# Patient Record
Sex: Male | Born: 1995
Health system: Southern US, Community
[De-identification: ages and names within clinical notes are randomized; demographics above are authoritative.]

## PROBLEM LIST (undated history)

## (undated) DIAGNOSIS — F1210 Cannabis abuse, uncomplicated: Secondary | ICD-10-CM

## (undated) DIAGNOSIS — F200 Paranoid schizophrenia: Secondary | ICD-10-CM

## (undated) DIAGNOSIS — F251 Schizoaffective disorder, depressive type: Principal | ICD-10-CM

---
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
## 2014-08-11 ENCOUNTER — Emergency Department (HOSPITAL_COMMUNITY)
Admission: EM | Admit: 2014-08-11 | Discharge: 2014-08-12 | Disposition: A | Discharge status: Other Acute Inpatient Hospital | Payer: Medicaid Other | Attending: Emergency Medicine | Admitting: Emergency Medicine

## 2014-08-11 ENCOUNTER — Encounter (HOSPITAL_COMMUNITY): Payer: Self-pay | Admitting: *Deleted

## 2014-08-11 DIAGNOSIS — F323 Major depressive disorder, single episode, severe with psychotic features: Secondary | ICD-10-CM | POA: Diagnosis present

## 2014-08-11 DIAGNOSIS — Y9289 Other specified places as the place of occurrence of the external cause: Secondary | ICD-10-CM | POA: Diagnosis not present

## 2014-08-11 DIAGNOSIS — Y998 Other external cause status: Secondary | ICD-10-CM | POA: Insufficient documentation

## 2014-08-11 DIAGNOSIS — Y9389 Activity, other specified: Secondary | ICD-10-CM | POA: Insufficient documentation

## 2014-08-11 DIAGNOSIS — T404X2A Poisoning by other synthetic narcotics, intentional self-harm, initial encounter: Secondary | ICD-10-CM | POA: Insufficient documentation

## 2014-08-11 DIAGNOSIS — R45851 Suicidal ideations: Secondary | ICD-10-CM

## 2014-08-11 DIAGNOSIS — T50902A Poisoning by unspecified drugs, medicaments and biological substances, intentional self-harm, initial encounter: Secondary | ICD-10-CM | POA: Diagnosis present

## 2014-08-11 LAB — COMPREHENSIVE METABOLIC PANEL
ALK PHOS: 45 U/L (ref 39–117)
ALT: 20 U/L (ref 0–53)
AST: 23 U/L (ref 0–37)
Albumin: 4.4 g/dL (ref 3.5–5.2)
Anion gap: 6 (ref 5–15)
BUN: 15 mg/dL (ref 6–23)
CHLORIDE: 103 mmol/L (ref 96–112)
CO2: 30 mmol/L (ref 19–32)
Calcium: 9.6 mg/dL (ref 8.4–10.5)
Creatinine, Ser: 1.27 mg/dL (ref 0.50–1.35)
GFR calc Af Amer: >90 mL/min (ref >90)
GFR calc non Af Amer: 80 mL/min — ABNORMAL LOW (ref >90)
Glucose, Bld: 84 mg/dL (ref 70–99)
Potassium: 4.6 mmol/L (ref 3.5–5.1)
Sodium: 139 mmol/L (ref 135–145)
TOTAL PROTEIN: 8.4 g/dL — AB (ref 6.0–8.3)
Total Bilirubin: 1.3 mg/dL — ABNORMAL HIGH (ref 0.3–1.2)

## 2014-08-11 LAB — CBC
HCT: 46.3 % (ref 39.0–52.0)
Hemoglobin: 15.6 g/dL (ref 13.0–17.0)
MCH: 30.4 pg (ref 26.0–34.0)
MCHC: 33.7 g/dL (ref 30.0–36.0)
MCV: 90.1 fL (ref 78.0–100.0)
Platelets: 217 10*3/uL (ref 150–400)
RBC: 5.14 MIL/uL (ref 4.22–5.81)
RDW: 12.9 % (ref 11.5–15.5)
WBC: 6 10*3/uL (ref 4.0–10.5)

## 2014-08-11 LAB — RAPID URINE DRUG SCREEN, HOSP PERFORMED
Amphetamines: NOT DETECTED
BENZODIAZEPINES: NOT DETECTED
Barbiturates: NOT DETECTED
Cocaine: NOT DETECTED
OPIATES: NOT DETECTED
TETRAHYDROCANNABINOL: NOT DETECTED

## 2014-08-11 LAB — SALICYLATE LEVEL: Salicylate Lvl: <4 mg/dL (ref 2.8–20.0)

## 2014-08-11 LAB — ETHANOL: Alcohol, Ethyl (B): <5 mg/dL (ref 0–9)

## 2014-08-11 LAB — ACETAMINOPHEN LEVEL: Acetaminophen (Tylenol), Serum: <10 ug/mL — ABNORMAL LOW (ref 10–30)

## 2014-08-11 MED ORDER — ONDANSETRON HCL 4 MG PO TABS: 4.0000 mg | ORAL_TABLET | Freq: Three times a day (TID) | ORAL | Status: DC | PRN

## 2014-08-11 MED ORDER — ALUM & MAG HYDROXIDE-SIMETH 200-200-20 MG/5ML PO SUSP: 30.0000 mL | ORAL | Status: DC | PRN

## 2014-08-11 MED ORDER — IBUPROFEN 200 MG PO TABS
600.0000 mg | ORAL_TABLET | Freq: Three times a day (TID) | ORAL | Status: DC | PRN
  Administered 2014-08-11: 600 mg via ORAL
  Filled 2014-08-11: qty 3

## 2014-08-11 NOTE — ED Notes (Signed)
Pt ambulatory w/o difficulty to room 37 
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
## 2014-08-11 NOTE — ED Notes (Signed)
Bed: [facility] Expected date:  Expected time:  Means of arrival:  Comments: 

## 2014-08-11 NOTE — ED Notes (Signed)
Bed: [facility] Expected date: 08/11/14 Expected time: 1:59 PM Means of arrival: Ambulance Comments: OD of ? Trazadone

## 2014-08-11 NOTE — ED Notes (Signed)
Attempted to give report to Psych ED, RN unavailable at this time.

## 2014-08-11 NOTE — ED Notes (Signed)
Patient is in bathroom changing into scrubs right now

## 2014-08-11 NOTE — ED Notes (Addendum)
Pt sts he took "4 or 5" tramadol pills from an old prescription bottle. Pt denies suicide attempt. Denies SI/HI. Family sts that pt. Has been withdrawn lately and not speaking very much, "pt mopes around." Pt does show interest in speaking with someone about his feelings. Pt denies any symptoms at this time. Pt A&Ox4.

## 2014-08-11 NOTE — ED Notes (Signed)
Report received from Lizbeth BarkJanie Rambo RN. Pt. Alert and oriented in no distress denies SI, AVH and pain. Pt. States he would "hurt Timothy [name] if I could".  Pt. Instructed to come to me with problems or concerns.Will continue to monitor for safety via security cameras and Q 15 minute checks.

## 2014-08-11 NOTE — ED Notes (Signed)
Report given, RN requested ten minutes before bringing patient back to 42.

## 2014-08-11 NOTE — ED Notes (Addendum)
Per ems pt is from home, family called ems pt they thought pt had taken some of grandmas tramadal, old prescription from 2011. Pt initially told medic he had not taken any medicine, then told [employer] he took meds, then told ems he took meds 1 month ago. Alert and oriented x4.   Upon rn assessment, pt repots his mom called ems because "he was acting weird". When asked pt says he is not acting weird. Pt is withdrawn, very limited in speech, answers yes and no, will not make eye contact. Pt reports around 1 month ago he took some meds, denies ETOH, drug use, or taking any medicines today. Reports last marijuana use was months ago. Denies medical hx. Denies pain. Denies SI/HI, AH/VH.

## 2014-08-11 NOTE — ED Provider Notes (Signed)
CSN: [id number]     Arrival date & time 08/11/14  1403 History   First MD Initiated Contact with Patient 08/11/14 1531     Chief Complaint  Patient presents with  . possible drug overdose   . unusual behavior      (Consider location/radiation/quality/duration/timing/severity/associated sxs/prior Treatment) HPI The patient reports that he took about 4 or 5 tramadol pills that were his grandmother's. In the triage note he apparently denies suicidal ideation. To me he reports that he has been getting increasingly depressed and had "things" building up for a number of months. He reports he took the pills to escape. I did notice some very well healed faint scars on his forearm that were consistent with cutting. The patient did report that he had had some cutting behaviors a number of months ago. He denies any other prior suicide attempt. He denies alcohol use he reports intermittent marijuana use. He denies any recent medical problems. He does not give any additional history on what types of things are causing him distress or depression. History reviewed. No pertinent past medical history. History reviewed. No pertinent past surgical history. History reviewed. No pertinent family history. History  Substance Use Topics  . Smoking status: Never Smoker   . Smokeless tobacco: Not on file  . Alcohol Use: No    Review of Systems  10 Systems reviewed and are negative for acute change except as noted in the HPI.   Allergies  Review of patient's allergies indicates no known allergies.  Home Medications   Prior to Admission medications   Not on File   BP 135/70 mmHg  Pulse 76  Temp(Src) 98.3 F (36.8 C) (Oral)  Resp 18  SpO2 100% Physical Exam  Constitutional: He is oriented to person, place, and time. He appears well-developed and well-nourished.  HENT:  Head: Normocephalic and atraumatic.  Eyes: EOM are normal. Pupils are equal, round, and reactive to light.  Mild diffuse  conjunctival injection. Normal extraocular motions.  Neck: Neck supple.  Cardiovascular: Normal rate, regular rhythm, normal heart sounds and intact distal pulses.   Pulmonary/Chest: Effort normal and breath sounds normal.  Abdominal: Soft. Bowel sounds are normal. He exhibits no distension. There is no tenderness.  Musculoskeletal: Normal range of motion. He exhibits no edema.  Neurological: He is alert and oriented to person, place, and time. He has normal strength. Coordination normal. GCS eye subscore is 4. GCS verbal subscore is 5. GCS motor subscore is 6.  Skin: Skin is warm, dry and intact.  Very faint well-healed linear scars on left forearm. Some erosions present on the back suggestive of picking.  Psychiatric: He has a normal mood and affect.    ED Course  Procedures (including critical care time) Labs Review Labs Reviewed  ACETAMINOPHEN LEVEL - Abnormal; Notable for the following:    Acetaminophen (Tylenol), Serum <10.0 (*)    All other components within normal limits  COMPREHENSIVE METABOLIC PANEL - Abnormal; Notable for the following:    Total Protein 8.4 (*)    Total Bilirubin 1.3 (*)    GFR calc non Af Amer 80 (*)    All other components within normal limits  CBC  ETHANOL  SALICYLATE LEVEL  URINE RAPID DRUG SCREEN (HOSP PERFORMED)    Imaging Review No results found.   EKG Interpretation   Date/Time:  Sunday August 11 2014 14:10:16 EST Ventricular Rate:  80 PR Interval:  177 QRS Duration: 82 QT Interval:  348 QTC Calculation: 401 R Axis:  84 Text Interpretation:  Sinus rhythm Baseline wander in lead(s) V2 Confirmed  by DOCHERTY  MD, MEGAN [phone]([id number]) on 08/11/2014 2:19:40 PM      MDM   Final diagnoses:  Suicidal ideation  Overdose, intentional self-harm, initial encounter   At this time the patient is well in appearance without confusion, vomiting or pain. It appears his overdose was of a nonlethal quantity of tramadol. At this time he is medically  cleared for further psychiatric evaluation.    Arby BarretteMarcy Britten Seyfried, MD 08/11/14 [phone]

## 2014-08-12 DIAGNOSIS — T1491 Suicide attempt: Secondary | ICD-10-CM

## 2014-08-12 DIAGNOSIS — T50902A Poisoning by unspecified drugs, medicaments and biological substances, intentional self-harm, initial encounter: Secondary | ICD-10-CM | POA: Diagnosis present

## 2014-08-12 DIAGNOSIS — T404X2A Poisoning by other synthetic narcotics, intentional self-harm, initial encounter: Secondary | ICD-10-CM

## 2014-08-12 DIAGNOSIS — T50901A Poisoning by unspecified drugs, medicaments and biological substances, accidental (unintentional), initial encounter: Secondary | ICD-10-CM | POA: Insufficient documentation

## 2014-08-12 DIAGNOSIS — F323 Major depressive disorder, single episode, severe with psychotic features: Secondary | ICD-10-CM

## 2014-08-12 DIAGNOSIS — R45851 Suicidal ideations: Secondary | ICD-10-CM

## 2014-08-12 MED ORDER — LORAZEPAM 1 MG PO TABS: 1.0000 mg | ORAL_TABLET | Freq: Once | ORAL | Status: DC

## 2014-08-12 NOTE — ED Notes (Signed)
Patient has become less verbal throughout the day.  When lunch came he stared at his tray for a long time and finally ate a small amount.  He then stood in one place in his room for a long while, then took off all his clothes and walked out into the hallway.  He was able to be directed back to his room with two staff members assisting him.  He has been nonverbal since sometime before lunch.

## 2014-08-12 NOTE — BH Assessment (Signed)
[facility] Assessment Progress Note  Per Para MarchJeanette at 14:10, [facility] Rm 8[facility] by Dr Dewaine CongerBarker.  Please call report to [phone].  Nanine MeansJamison Lord, NP has been notified, and she concurs with this decision.  Pt's nurse, Dawnaly, has been notified.  Doylene Canninghomas Jared Whorley, MA Triage Specialist 08/12/2014 @ 14:16
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
## 2014-08-12 NOTE — ED Notes (Signed)
Report called to Lance BoschShea, RN at [facility].  [facility], left message.  Lance BoschShea requests that we call the nursing station when patient is en route, [phone].

## 2014-08-12 NOTE — ED Notes (Signed)
[facility] at bedside to transport pt [facility].
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
## 2014-08-12 NOTE — ED Notes (Signed)
[facility].  Spoke with Lance BoschShea, [place].

## 2014-08-12 NOTE — B[facility] Counselor (Signed)
TTS Counselor faxed referrals to the following facilities in effort to obtain inpt placement:  [facility] [address]

## 2014-09-16 ENCOUNTER — Emergency Department (EMERGENCY_DEPARTMENT_HOSPITAL)
Admission: EM | Admit: 2014-09-16 | Discharge: 2014-09-18 | Disposition: A | Discharge status: Home / Self Care | Payer: Medicaid Other | Source: Home / Self Care | Attending: Emergency Medicine | Admitting: Emergency Medicine

## 2014-09-16 ENCOUNTER — Encounter (HOSPITAL_COMMUNITY): Payer: Self-pay | Admitting: Emergency Medicine

## 2014-09-16 DIAGNOSIS — F251 Schizoaffective disorder, depressive type: Secondary | ICD-10-CM | POA: Insufficient documentation

## 2014-09-16 DIAGNOSIS — Z79899 Other long term (current) drug therapy: Secondary | ICD-10-CM

## 2014-09-16 DIAGNOSIS — Z880 Allergy status to penicillin: Secondary | ICD-10-CM

## 2014-09-16 DIAGNOSIS — F1220 Cannabis dependence, uncomplicated: Secondary | ICD-10-CM | POA: Diagnosis present

## 2014-09-16 DIAGNOSIS — R5383 Other fatigue: Secondary | ICD-10-CM

## 2014-09-16 HISTORY — DX: Paranoid schizophrenia: F20.0

## 2014-09-16 HISTORY — DX: Schizoaffective disorder, depressive type: F25.1

## 2014-09-16 HISTORY — DX: Cannabis abuse, uncomplicated: F12.10

## 2014-09-16 LAB — COMPREHENSIVE METABOLIC PANEL
ALBUMIN: 4.2 g/dL (ref 3.5–5.2)
ALT: 20 U/L (ref 0–53)
ANION GAP: 7 (ref 5–15)
AST: 26 U/L (ref 0–37)
Alkaline Phosphatase: 41 U/L (ref 39–117)
BUN: 12 mg/dL (ref 6–23)
CHLORIDE: 104 mmol/L (ref 96–112)
CO2: 30 mmol/L (ref 19–32)
CREATININE: 1.12 mg/dL (ref 0.50–1.35)
Calcium: 9.2 mg/dL (ref 8.4–10.5)
GFR calc Af Amer: >90 mL/min (ref >90)
Glucose, Bld: 81 mg/dL (ref 70–99)
Potassium: 3.9 mmol/L (ref 3.5–5.1)
Sodium: 141 mmol/L (ref 135–145)
Total Bilirubin: 0.7 mg/dL (ref 0.3–1.2)
Total Protein: 7.8 g/dL (ref 6.0–8.3)

## 2014-09-16 LAB — CBC
HEMATOCRIT: 42.8 % (ref 39.0–52.0)
HEMOGLOBIN: 13.8 g/dL (ref 13.0–17.0)
MCH: 30.1 pg (ref 26.0–34.0)
MCHC: 32.2 g/dL (ref 30.0–36.0)
MCV: 93.4 fL (ref 78.0–100.0)
Platelets: 243 10*3/uL (ref 150–400)
RBC: 4.58 MIL/uL (ref 4.22–5.81)
RDW: 13.6 % (ref 11.5–15.5)
WBC: 7 10*3/uL (ref 4.0–10.5)

## 2014-09-16 NOTE — ED Notes (Addendum)
Family states patient has been taking medication for depression and anxiety and for 2 days hes been slow to speak and has been dizzy and lethargic. States that they cut his citalopram in half 2 days ago. Stroke scale negative. Pt denies taking any other medications or drugs aside from marijuana. Alert and oriented.

## 2014-09-17 ENCOUNTER — Emergency Department (HOSPITAL_COMMUNITY): Payer: Medicaid Other

## 2014-09-17 ENCOUNTER — Encounter (HOSPITAL_COMMUNITY): Payer: Self-pay | Admitting: Psychiatry

## 2014-09-17 DIAGNOSIS — F251 Schizoaffective disorder, depressive type: Secondary | ICD-10-CM

## 2014-09-17 DIAGNOSIS — R45851 Suicidal ideations: Secondary | ICD-10-CM

## 2014-09-17 DIAGNOSIS — F1220 Cannabis dependence, uncomplicated: Secondary | ICD-10-CM | POA: Diagnosis present

## 2014-09-17 HISTORY — DX: Schizoaffective disorder, depressive type: F25.1

## 2014-09-17 LAB — RAPID URINE DRUG SCREEN, HOSP PERFORMED
Amphetamines: NOT DETECTED
BARBITURATES: NOT DETECTED
BENZODIAZEPINES: NOT DETECTED
COCAINE: NOT DETECTED
Opiates: NOT DETECTED
Tetrahydrocannabinol: POSITIVE — AB

## 2014-09-17 MED ORDER — BENZTROPINE MESYLATE 1 MG PO TABS
0.5000 mg | ORAL_TABLET | Freq: Two times a day (BID) | ORAL | Status: DC
  Administered 2014-09-17 (×2): 0.5 mg via ORAL
  Filled 2014-09-17 (×2): qty 1

## 2014-09-17 MED ORDER — LORAZEPAM 1 MG PO TABS
1.0000 mg | ORAL_TABLET | Freq: Two times a day (BID) | ORAL | Status: DC
  Administered 2014-09-17 (×2): 1 mg via ORAL
  Filled 2014-09-17 (×2): qty 1

## 2014-09-17 MED ORDER — TRAZODONE HCL 50 MG PO TABS
50.0000 mg | ORAL_TABLET | Freq: Every evening | ORAL | Status: DC | PRN
  Administered 2014-09-17: 50 mg via ORAL
  Filled 2014-09-17: qty 1

## 2014-09-17 MED ORDER — OLANZAPINE 5 MG PO TBDP
5.0000 mg | ORAL_TABLET | Freq: Two times a day (BID) | ORAL | Status: DC
  Administered 2014-09-17: 5 mg via ORAL
  Filled 2014-09-17: qty 1

## 2014-09-17 NOTE — Progress Notes (Signed)
Pt seen by [facility] staff and given [facility] to pt and mother

## 2014-09-17 NOTE — ED Notes (Signed)
Pt resting quietly in bed with eyes closed. Respirations are even and unlabored. No acute distress noted. Safety has been maintained with q15 minute observation. Will continue current POC 

## 2014-09-17 NOTE — ED Notes (Signed)
Awake. Verbally responsive. A/O x4. Resp even and unlabored. No audible adventitious breath sounds noted. ABC's intact. Family at bedside. 

## 2014-09-17 NOTE — ED Notes (Signed)
Resting quietly with eye closed. Easily arousable. Verbally responsive. Resp even and unlabored. ABC's intact. No behavior problems noted. Pt ambulated to BR with steady gait. NAD noted.

## 2014-09-17 NOTE — ED Notes (Signed)
New transfer from ED. Slow to respond to assessment questions, but overall cooperative with assessment. Appears to have some psychomotor retardation. Denies AVH but appears to be internally preoccupied. He denies HI/SI and contracts for safety. Unit policies and expectations reviewed and understanding verbalized. He offered no questions or concerns. Q15 minute safety checks initiated.
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
## 2014-09-17 NOTE — ED Notes (Signed)
Awake. Verbally responsive. A/O x4. Resp even and unlabored. No audible adventitious breath sounds noted. ABC's intact. Pt ambulated to BR with steady gait. Family at bedside.

## 2014-09-17 NOTE — Progress Notes (Signed)
CSW [facility] at [facility]. CSW obtained signatures from the pt for the requested documents, which included the Voluntary and Release paper work.  CSW made nurse aware. CSW also gave nurse the paperwork.  Trish MageBrittney Terral Cooks, LCSWA [phone] ED CSW 09/17/2014 4:29 PM
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
## 2014-09-17 NOTE — ED Notes (Signed)
Resting quietly with eye closed. Easily arousable. Verbally responsive. Resp even and unlabored. ABC's intact. Family at bedside. NAD noted.  
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
## 2014-09-17 NOTE — ED Notes (Signed)
Mother has all of pt's belongings.

## 2014-09-17 NOTE — ED Provider Notes (Signed)
6:00 AM Patient signed out to me at shift change by Sharen HonesGail Schultz, NP.  Patient presents today with back pain, lethargy, and muscle twitching.  He was recently started on Celexa and symptoms thought to be a side effect of the medication.  Plan is for patient to be evaluated by Psychiatry for possible medication change.    10:45 AM Patient has been seen by Psychiatry who recommended inpatient psychiatric treatment for stabilization.  Placement pending.  Santiago GladHeather Pernell Dikes, PA-C 09/18/14 2153  Gilda Creasehristopher J Pollina, MD 09/21/14 [phone]

## 2014-09-17 NOTE — ED Notes (Signed)
Resting quietly with eye closed. Easily arousable. Verbally responsive. Resp even and unlabored. ABC's intact. No behavior problems noted. NAD noted. Family at bedside. 

## 2014-09-18 ENCOUNTER — Inpatient Hospital Stay (HOSPITAL_COMMUNITY)
Admission: AD | Admit: 2014-09-18 | Discharge: 2014-09-25 | DRG: 885 | Disposition: A | Discharge status: Home / Self Care | Payer: Medicaid Other | Source: Intra-hospital | Attending: Psychiatry | Admitting: Psychiatry

## 2014-09-18 ENCOUNTER — Encounter (HOSPITAL_COMMUNITY): Payer: Self-pay | Admitting: *Deleted

## 2014-09-18 DIAGNOSIS — G4700 Insomnia, unspecified: Secondary | ICD-10-CM | POA: Diagnosis present

## 2014-09-18 DIAGNOSIS — F250 Schizoaffective disorder, bipolar type: Principal | ICD-10-CM | POA: Diagnosis present

## 2014-09-18 DIAGNOSIS — G2111 Neuroleptic induced parkinsonism: Secondary | ICD-10-CM | POA: Diagnosis present

## 2014-09-18 DIAGNOSIS — F1220 Cannabis dependence, uncomplicated: Secondary | ICD-10-CM

## 2014-09-18 DIAGNOSIS — Z62810 Personal history of physical and sexual abuse in childhood: Secondary | ICD-10-CM | POA: Diagnosis present

## 2014-09-18 DIAGNOSIS — R45851 Suicidal ideations: Secondary | ICD-10-CM | POA: Diagnosis present

## 2014-09-18 DIAGNOSIS — T43595A Adverse effect of other antipsychotics and neuroleptics, initial encounter: Secondary | ICD-10-CM | POA: Diagnosis present

## 2014-09-18 DIAGNOSIS — Z720 Tobacco use: Secondary | ICD-10-CM | POA: Diagnosis not present

## 2014-09-18 DIAGNOSIS — F1290 Cannabis use, unspecified, uncomplicated: Secondary | ICD-10-CM | POA: Diagnosis not present

## 2014-09-18 DIAGNOSIS — F17200 Nicotine dependence, unspecified, uncomplicated: Secondary | ICD-10-CM | POA: Diagnosis present

## 2014-09-18 MED ORDER — LORAZEPAM 2 MG/ML IJ SOLN: 1.0000 mg | Freq: Four times a day (QID) | INTRAMUSCULAR | Status: DC | PRN

## 2014-09-18 MED ORDER — DIVALPROEX SODIUM 500 MG PO DR TAB
500.0000 mg | DELAYED_RELEASE_TABLET | Freq: Two times a day (BID) | ORAL | Status: DC
  Administered 2014-09-18 – 2014-09-25 (×15): 500 mg via ORAL
  Filled 2014-09-18 (×2): qty 1
  Filled 2014-09-18: qty 28
  Filled 2014-09-18 (×8): qty 1
  Filled 2014-09-18: qty 28
  Filled 2014-09-18 (×9): qty 1

## 2014-09-18 MED ORDER — LORAZEPAM 1 MG PO TABS: 1.0000 mg | ORAL_TABLET | Freq: Four times a day (QID) | ORAL | Status: DC | PRN

## 2014-09-18 MED ORDER — ACETAMINOPHEN 325 MG PO TABS: 650.0000 mg | ORAL_TABLET | Freq: Four times a day (QID) | ORAL | Status: DC | PRN

## 2014-09-18 MED ORDER — CITALOPRAM HYDROBROMIDE 20 MG PO TABS
20.0000 mg | ORAL_TABLET | Freq: Every day | ORAL | Status: DC
  Administered 2014-09-18: 20 mg via ORAL
  Filled 2014-09-18 (×3): qty 1

## 2014-09-18 MED ORDER — QUETIAPINE FUMARATE ER 50 MG PO TB24
50.0000 mg | ORAL_TABLET | Freq: Every day | ORAL | Status: DC
  Administered 2014-09-18: 50 mg via ORAL
  Filled 2014-09-18 (×3): qty 1

## 2014-09-18 MED ORDER — HYDROXYZINE HCL 25 MG PO TABS
25.0000 mg | ORAL_TABLET | Freq: Four times a day (QID) | ORAL | Status: DC | PRN
  Filled 2014-09-18: qty 30

## 2014-09-18 MED ORDER — MAGNESIUM HYDROXIDE 400 MG/5ML PO SUSP: 30.0000 mL | Freq: Every day | ORAL | Status: DC | PRN

## 2014-09-18 MED ORDER — NICOTINE 21 MG/24HR TD PT24
21.0000 mg | MEDICATED_PATCH | Freq: Every day | TRANSDERMAL | Status: DC
  Administered 2014-09-18 – 2014-09-25 (×8): 21 mg via TRANSDERMAL
  Filled 2014-09-18 (×11): qty 1

## 2014-09-18 MED ORDER — BENZTROPINE MESYLATE 0.5 MG PO TABS
0.5000 mg | ORAL_TABLET | Freq: Two times a day (BID) | ORAL | Status: DC
  Administered 2014-09-18 – 2014-09-25 (×15): 0.5 mg via ORAL
  Filled 2014-09-18 (×12): qty 1
  Filled 2014-09-18: qty 28
  Filled 2014-09-18 (×4): qty 1
  Filled 2014-09-18: qty 28
  Filled 2014-09-18 (×3): qty 1

## 2014-09-18 MED ORDER — TRAZODONE HCL 50 MG PO TABS
50.0000 mg | ORAL_TABLET | Freq: Every evening | ORAL | Status: DC | PRN
  Administered 2014-09-18 – 2014-09-22 (×5): 50 mg via ORAL
  Filled 2014-09-18 (×11): qty 1
  Filled 2014-09-18: qty 28
  Filled 2014-09-18 (×4): qty 1
  Filled 2014-09-18: qty 28
  Filled 2014-09-18: qty 1

## 2014-09-18 MED ORDER — HYDROCERIN EX CREA
TOPICAL_CREAM | Freq: Three times a day (TID) | CUTANEOUS | Status: DC
  Administered 2014-09-18 – 2014-09-25 (×18): via TOPICAL
  Filled 2014-09-18: qty 113

## 2014-09-18 MED ORDER — ALUM & MAG HYDROXIDE-SIMETH 200-200-20 MG/5ML PO SUSP: 30.0000 mL | ORAL | Status: DC | PRN

## 2014-09-18 NOTE — ED Notes (Signed)
Report given to Car[facility] GuileSandra, [facility]. Dois DavenportSandra, Rn requested that patient be sent over at 3:15 am.

## 2014-09-18 NOTE — Progress Notes (Signed)
D: Pt denies SI/HI/AV. Pt is pleasant and cooperative. Pt rates depression at a 5, anxiety at a 0, and Helplessness/hopelessness at a 0.  A: Pt was offered support and encouragement. Pt was given scheduled medications. Pt was encourage to attend groups. Q 15 minute checks were done for safety.  R:Pt attends groups and interacts well with peers and staff. Pt taking medication. Pt has no complaints.Pt receptive to treatment and safety maintained on unit.

## 2014-09-18 NOTE — [facility] Group Notes (Signed)
[facility] LCSW Group Therapy  09/18/2014 1:31 PM  Type of Therapy:  Group Therapy  Participation Level:  Did not attend.   Modes of Intervention:  Discussion, Education, Socialization and Support  Summary of Progress/Problems:Mental [employer] [facility](MHA) speaker came to talk about his personal journey with substance abuse and mental illness. Group members were challenged to process ways by which to relate to the speaker. MHA speaker provided handouts and educational information pertaining to groups and services offered by the [facility].    [name],Timothy 09/18/2014, 1:31 PM

## 2014-09-18 NOTE — Progress Notes (Signed)
Report received from admitting RN.  Introduced self to pt and met with pt 1:1.  Pt denies SI/HI, denies hallucinations.  Pt given beverage and offered meal, pt declined meal.  Pt is calm and cooperative.  He reports he will notify staff of needs and concerns.  Pt verbally contracts for safety.  Will continue to monitor and assess for safety.

## 2014-09-18 NOTE — Tx Team (Signed)
Initial Interdisciplinary Treatment Plan   PATIENT STRESSORS: Health problems Medication change or noncompliance   PATIENT STRENGTHS: Ability for insight Motivation for treatment/growth Supportive family/friends   PROBLEM LIST: Problem List/Patient Goals Date to be addressed Date deferred Reason deferred Estimated date of resolution  depression 09/18/14   At d/c  Psychosis "feel better" 09/18/14   At d/c  Suicidal ideation 09/18/14   At d/c                                       DISCHARGE CRITERIA:  Improved stabilization in mood, thinking, and/or behavior Need for constant or close observation no longer present Verbal commitment to aftercare and medication compliance  PRELIMINARY DISCHARGE PLAN: Outpatient therapy Return to previous living arrangement  PATIENT/FAMIILY INVOLVEMENT: This treatment plan has been presented to and reviewed with the patient, Timothy [name].  The patient and family have been given the opportunity to ask questions and make suggestions.  Celene KrasRobinson, Nillie Bartolotta G 09/18/2014, 4:22 AM

## 2014-09-18 NOTE — Discharge Instructions (Signed)
[facility]

## 2014-09-18 NOTE — ED Notes (Signed)
Pt transported to [facility] by [facility] transportation service for continuation of specialized care. He left in no acute distress. 
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
## 2014-09-18 NOTE — Progress Notes (Signed)
[facility] and [facility]. Denies SI/HI, -A/[place] @ present to Clinical research associatewriter. [facility]. Pt verbally contracts for safety.  Reported worsening depression, passive suicidal thoughts and body stiffness on presentation to ED, pt now reports feeling better. He admits to not being able to have an erection which has made him feel sad and depressed. Denies alcohol use but reports daily marijuana use. Emotional support and encouragement given. Pt admitted for evaluation, stabilization and reduction of baseline. Will monitor closely.

## 2014-09-18 NOTE — [facility] Group Notes (Signed)
[facility]   09/18/2014 12:59 PM  Participation Quality:  Did not attend.  Was in interview with Dr.    Roderic OvensNorth, Baldo Daubodney B
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
## 2014-09-19 LAB — LIPID PANEL
CHOL/HDL RATIO: 3.1 ratio
Cholesterol: 169 mg/dL (ref 0–200)
HDL: 55 mg/dL (ref >39)
LDL CALC: 100 mg/dL — AB (ref 0–99)
Triglycerides: 68 mg/dL (ref <150)
VLDL: 14 mg/dL (ref 0–40)

## 2014-09-19 LAB — URINALYSIS, ROUTINE W REFLEX MICROSCOPIC
BILIRUBIN URINE: NEGATIVE
Glucose, UA: NEGATIVE mg/dL
Hgb urine dipstick: NEGATIVE
Ketones, ur: NEGATIVE mg/dL
NITRITE: NEGATIVE
Protein, ur: NEGATIVE mg/dL
Specific Gravity, Urine: 1.016 (ref 1.005–1.030)
Urobilinogen, UA: 0.2 mg/dL (ref 0.0–1.0)
pH: 6.5 (ref 5.0–8.0)

## 2014-09-19 LAB — URINE MICROSCOPIC-ADD ON

## 2014-09-19 LAB — TSH: TSH: 0.565 u[IU]/mL (ref 0.350–4.500)

## 2014-09-19 MED ORDER — QUETIAPINE FUMARATE ER 50 MG PO TB24
100.0000 mg | ORAL_TABLET | Freq: Every day | ORAL | Status: DC
  Administered 2014-09-19: 100 mg via ORAL
  Filled 2014-09-19 (×2): qty 2

## 2014-09-19 NOTE — Progress Notes (Signed)
D. Pt had been up and visible in milieu this evening, did attend and participate in evening group activity. Pt reports feeling ok however is slow to respond but is able to respond appropriately to questions. Pt did receive medications without incident and did not verbalize any complaints. A. Support and encouragement provided. R. Safety maintained, will continue to monitor.

## 2014-09-19 NOTE — Progress Notes (Signed)
[facility] MD Progress Note  09/19/2014 3:04 PM Timothy [name]  MRN:  [id number] Subjective:  Patient states " I am fine , I do not have any side effects to medications that I had before.'  Objective; Patient seen and chart reviewed.Pt discussed with treatment team. Pt presented with psychosis, depression as well as side effects to antipsychotics that were started on an outpatient basis. Pt today continues to improve. Pt is minimal at baseline , does not talk a lot , responds to questions in monosyllables. Pt today with apparent tremors around his mouth , affect is brighter than yesterday, tends to smile more. Pt denies any new concerns , denies si/hi/ah/vh. Pt reports sleep as good, but per EHR ,sleep is documented as <2 hrs.   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 Schizoaffective disorder, bipolar type [F25.0] 09/18/2014  . Tobacco use disorder [Z72.0] 09/18/2014  . 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 Cannabis use disorder, severe, dependence [F12.20] 09/17/2014   Total Time spent with patient: 30 minutes   Past Medical History:  Past Medical History  Diagnosis Date  . Paranoid schizophrenia   . Cannabis abuse   . Schizoaffective disorder, depressive type 09/17/2014   History reviewed. No pertinent past surgical history. Family History: History reviewed. No pertinent family history. Social History:  History  Alcohol Use No     History  Drug Use  . Yes  . Special: Marijuana    History   Social History  . Marital Status: Single    Spouse Name: N/A  . Number of Children: N/A  . Years of Education: N/A   Social History Main Topics  . Smoking  status: Never Smoker   . Smokeless tobacco: Not on file  . Alcohol Use: No  . Drug Use: Yes    Special: Marijuana  . Sexual Activity: No   Other Topics Concern  . None   Social History Narrative   Additional History:    Sleep: Fair  Appetite:  Fair     Musculoskeletal: Strength & Muscle Tone: within normal limits Gait & Station: normal Patient leans: N/A   Psychiatric Specialty Exam: Physical Exam  Review of Systems  Psychiatric/Behavioral: Positive for depression and hallucinations. The patient is nervous/anxious.     Blood pressure 127/68, pulse 77, temperature 97.2 F (36.2 C), temperature source Oral, resp. rate 16, height  (1.905 m), weight 93.895 kg (207 lb).Body mass index is 25.87 kg/(m^2).  General Appearance: Fairly Groomed  Patent attorney::  Fair  Speech:  Clear and Coherent  Volume:  Decreased  Mood:  Dysphoric  Affect:  Blunt and but improving , smiles at times  Thought Process:  Goal Directed,but minimal interaction all together , speaks in monosyllables   Orientation:  Full (Time, Place, and Person)  Thought Content:  Rumination  Suicidal Thoughts:  No  Homicidal Thoughts:  No  Memory:  Immediate;   Fair Recent;   Fair Remote;   Fair  Judgement:  Impaired  Insight:  Fair  Psychomotor Activity:  Normal  Concentration:  Fair  Recall:  [employer] of Knowledge:Fair  Language: Fair  Akathisia:  No  Handed:  Right  AIMS (if indicated):     Assets:  Physical Health Social Support  ADL's:  Intact  Cognition: WNL  Sleep:  Number of Hours: 1.25     Current Medications: Current Facility-Administered Medications  Medication Dose Route Frequency Provider Last Rate Last Dose  . acetaminophen (TYLENOL) tablet 650 mg  650 mg Oral Q6H PRN Kerry HoughSpencer E Simon, PA-C      . alum & mag hydroxide-simeth (MAALOX/MYLANTA) 200-200-20 MG/5ML suspension 30 mL  30 mL Oral Q4H PRN Kerry HoughSpencer E Simon, PA-C      . benztropine (COGENTIN) tablet 0.5 mg  0.5 mg Oral BID  Kerry HoughSpencer E Simon, PA-C   0.5 mg at 09/19/14 0804  . divalproex (DEPAKOTE) DR tablet 500 mg  500 mg Oral Q12H Jomarie LongsSaramma Georgetta Crafton, MD   500 mg at 09/19/14 0804  . hydrocerin (EUCERIN) cream   Topical TID Jomarie LongsSaramma Tolulope Pinkett, MD      . hydrOXYzine (ATARAX/VISTARIL) tablet 25 mg  25 mg Oral Q6H PRN Kerry HoughSpencer E Simon, PA-C      . LORazepam (ATIVAN) tablet 1 mg  1 mg Oral Q6H PRN Jomarie LongsSaramma Myia Bergh, MD       Or  . LORazepam (ATIVAN) injection 1 mg  1 mg Intramuscular Q6H PRN Waylon Hershey, MD      . magnesium hydroxide (MILK OF MAGNESIA) suspension 30 mL  30 mL Oral Daily PRN Kerry HoughSpencer E Simon, PA-C      . nicotine (NICODERM CQ - dosed in mg/24 hours) patch 21 mg  21 mg Transdermal Daily Jomarie LongsSaramma Gia Lusher, MD   21 mg at 09/19/14 0803  . QUEtiapine (SEROQUEL XR) 24 hr tablet 50 mg  50 mg Oral QHS Jomarie LongsSaramma Donita Newland, MD   50 mg at 09/18/14 2102  . traZODone (DESYREL) tablet 50 mg  50 mg Oral QHS,MR X 1 Kerry HoughSpencer E Simon, PA-C   50 mg at 09/18/14 2102    Lab Results:  Results for orders placed or performed during the hospital encounter of 09/18/14 (from the past 48 hour(s))  TSH     Status: None   Collection Time: 09/19/14  6:44 AM  Result Value Ref Range   TSH 0.565 0.350 - 4.500 uIU/mL    Comment: [facility]  Lipid panel     Status: Abnormal   Collection Time: 09/19/14  6:44 AM  Result Value Ref Range   Cholesterol 169 0 - 200 mg/dL   Triglycerides 68 <409<150 mg/dL   HDL 55 >81>39 mg/dL   Total CHOL/HDL Ratio 3.1 RATIO   VLDL 14 0 - 40 mg/dL   LDL Cholesterol 191100 (H) 0 - 99 mg/dL    Comment:        Total Cholesterol/HDL:CHD Risk Coronary Heart Disease Risk Table                     Men   Women  1/2 Average Risk   3.4   3.3  Average Risk       5.0   4.4  2 X Average Risk   9.6   7.1  3 X Average Risk  23.4   11.0        Use the calculated Patient Ratio above and the CHD Risk Table to determine the patient's CHD Risk.        ATP III CLASSIFICATION (LDL):  <100     mg/dL   Optimal   [id number]  mg/dL   Near or Above                    Optimal  130-159  mg/dL   Borderline  161-096  mg/dL   High  >045     mg/dL   Very High Performed at [facility]     Physical Findings: AIMS: Facial and Oral Movements Muscles of Facial Expression: None, normal Lips and Perioral Area: None, normal Jaw: None, normal Tongue: None, normal,Extremity Movements Upper (arms, wrists, hands, fingers): None, normal Lower (legs, knees, ankles, toes): None, normal, Trunk Movements Neck, shoulders, hips: None, normal, Overall Severity Severity of abnormal movements (highest score from questions above): None, normal Incapacitation due to abnormal movements: None, normal Patient's awareness of abnormal movements (rate only patient's report): No Awareness, Dental Status Current problems with teeth and/or dentures?: No Does patient usually wear dentures?: No  CIWA:  CIWA-Ar Total: 1 COWS:  COWS Total Score: 1   Assessment: Patient is a 37 y old AAM with hx of schizoaffective disorder , presented with negative sx as well as side effects to antipsychotics . Pt continues to improve , no side effects noted , however is limited verbally , does not communicate a lot , denies psychosis, has not seen his responding to internal stiumuli.   Treatment Plan Summary: Daily contact with patient to assess and evaluate symptoms and progress in treatment and Medication management Will increase Seroquel XR to 100 mg po qhs for psychosis, it has a low EPS profile and is better suited for this patient. Will monitor his tremors and continue Cogentin. Will continue Depakote 500 mg po bid for mood lability.Depakote level on 09/22/14. Ativan 1 mg po prn/IM prn for anxiety agitation. Discussed smoking cessation , pt is on Nicotine patch . Reviewed past medical records,treatment plan.  Will continue to monitor vitals ,medication compliance and treatment side effects while patient is here.  Will monitor for  medical issues as well as call consult as needed.  Reviewed labs . CSW will start working on disposition.  Patient to participate in therapeutic milieu .    Medical Decision Making:  Review of Psycho-Social Stressors (1), Review or order clinical lab tests (1), Review and summation of old records (2), Review of Last Therapy Session (1), Review or order medicine tests (1), Review of Medication Regimen & Side Effects (2) and Review of New Medication or Change in Dosage (2)     Johonna Binette MD 09/19/2014, 3:04 PM

## 2014-09-19 NOTE — Plan of Care (Signed)
Problem: Consults Goal: Suicide Risk Patient Education (See Patient Education module for education specifics)  Outcome: Completed/Met Date Met:  09/19/14 Nurse discussed suicidal information with pt.

## 2014-09-19 NOTE — [facility] Group Notes (Signed)
Adult Psychoeducational Group Note  Date:  09/19/2014 Time:  1:41 AM  Group Topic/Focus:  Wrap-Up Group:   The focus of this group is to help patients review their daily goal of treatment and discuss progress on daily workbooks.  Participation Level:  Minimal  Participation Quality:  Attentive  Affect:  Appropriate  Cognitive:  Appropriate  Insight: Lacking  Engagement in Group:  Limited  Modes of Intervention:  Discussion  Additional Comments:  Timothy GrandchildMarkie expressed that his day was good.  He spoke with his family.  His goal was to work on getting better.  Timothy RancherLindsay, Timothy [name] A 09/19/2014, 1:41 AM

## 2014-09-19 NOTE — Progress Notes (Signed)
D. Pt had been up and visible in milieu this evening, did attend and participate in group session. Pt reports that he is feeling ok and has denied any Auditory hallucinations. Pt presented this evening as not having very good eye contact and minimal interaction with staff other than to say and to reiterate that he is doing ok and starting to feel better. Pt did receive medications without incident. A. Support and encouragement provided. R. Safety maintained, will continue to monitor.

## 2014-09-19 NOTE — Progress Notes (Addendum)
D:  Patient's self inventory sheet, patient has fair sleep, no sleep medication given.  Fair appetite, normal energy level, good concentration.  Rated depression 4.  Denied hopeless and anxiety.  Denied withdrawals, but then stated she has chilling, cravings.  Denied SI.  Denied physical pain.  Goal is to get well to go home.  Plans to take medicine and go to groups.   Is concerned he may have UTI. A:  Medications administered per MD  Emotional support and encouragement given patient. R:  Denied SI and HI, contracts for safety.  Denied A/V hallucinations.  Safety maintained with 15 minute checks.  Verbal order for UA per NP.

## 2014-09-19 NOTE — Tx Team (Signed)
  Interdisciplinary Treatment Plan Update   Date Reviewed:  09/19/2014  Time Reviewed:  10:29 AM  Progress in Treatment:   Attending groups: Yes Participating in groups: Yes Taking medication as prescribed: Yes  Tolerating medication: Yes Family/Significant other contact made: No Patient understands diagnosis: Yes AEB asking for help with changing meds so he is not feeling so bad Discussing patient identified problems/goals with staff: Yes  See initial care plan Medical problems stabilized or resolved: Yes Denies suicidal/homicidal ideation: Yes  In tx team Patient has not harmed self or others: Yes  For review of initial/current patient goals, please see plan of care.  Estimated Length of Stay:  4-5 days  Reason for Continuation of Hospitalization: Depression Hallucinations Medication stabilization  New Problems/Goals identified:  N/A  Discharge Plan or Barriers:   return home, [facility]  Additional Comments:   " I started getting shaky [facility]"                        Patient seen this AM. Patient appears to be slow with psychomotor retardation, flat affect , delayed speech , possible thought blocking and has tremors around his mouth ( likely neuroleptic induced). Pt with hx of schizoaffective disorder, reports [facility]. Pt reports having hallucinations in the past while [facility] , voices asking him to kill self. Pt denies it at present. Pt denies any delusions or paranoia.  Patient reports hx of suicide attempt in the past - one month ago , tried to OD on medications as well as tried to hang self.    Depakote, Seroquel trial   Attendees:  Signature: Ivin BootySarama Eappen, MD 09/19/2014 10:29 AM   Signature: Richelle Itood Cedricka Sackrider, LCSW 09/19/2014 10:29 AM  Signature: Fransisca KaufmannLaura Davis, NP 09/19/2014 10:29 AM  Signature: Joslyn Devonaroline Beaudry, RN 09/19/2014 10:29 AM  Signature: Liborio NixonPatrice White, RN 09/19/2014 10:29 AM  Signature:   09/19/2014 10:29 AM  Signature:   09/19/2014 10:29 AM  Signature:    Signature:    Signature:    Signature:    Signature:    Signature:      Scribe for Treatment Team:   Richelle Itood Sarabi Sockwell, LCSW  09/19/2014 10:29 AM

## 2014-09-19 NOTE — [facility] Group Notes (Signed)
[facility] Group Notes:  (Counselor/Nursing/MHT/Case Management/Adjunct)  09/19/2014 1:15PM  Type of Therapy:  Group Therapy  Participation Level:  Active  Participation Quality:  Appropriate  Affect:  Flat  Cognitive:  Oriented  Insight:  Improving  Engagement in Group:  Limited  Engagement in Therapy:  Limited  Modes of Intervention:  Discussion, Exploration and Socialization  Summary of Progress/Problems: The topic for group was balance in life.  Pt participated in the discussion about when their life was in balance and out of balance and how this feels.  Pt discussed ways to get back in balance and short term goals they can work on to get where they want to be. "I'm generally a shy person, but I think it is important for me to talk, so I will.'  Shared that he feels that being in the hospital is a helpful place for him right now.  "I think my thoughts have settled down."  Brought up how it is important to not judge a book by its cover.  Worked the theme of smoking cannabis into his presentation several times.   Timothy [name], Timothy [name] 09/19/2014 1:51 PM

## 2014-09-19 NOTE — [facility] Group Notes (Signed)
The focus of this group is to educate the patient on the purpose and policies of crisis stabilization and provide a format to answer questions about their admission.  The group details unit policies and expectations of patients while admitted.  Patient attended 0900 nurse education orientation group this morning.  Patient actively participated, appropriate affect, alert, appropriate insight and engagement.  Today patient will work on 3 goals for discharge.  

## 2014-09-20 LAB — HEMOGLOBIN A1C
Hgb A1c MFr Bld: 5.5 % (ref 4.8–5.6)
Mean Plasma Glucose: 111 mg/dL

## 2014-09-20 MED ORDER — QUETIAPINE FUMARATE ER 200 MG PO TB24
200.0000 mg | ORAL_TABLET | Freq: Every day | ORAL | Status: DC
  Administered 2014-09-20 – 2014-09-24 (×5): 200 mg via ORAL
  Filled 2014-09-20 (×4): qty 1
  Filled 2014-09-20: qty 14
  Filled 2014-09-20 (×2): qty 1

## 2014-09-20 MED ORDER — NITROFURANTOIN MONOHYD MACRO 100 MG PO CAPS
100.0000 mg | ORAL_CAPSULE | Freq: Two times a day (BID) | ORAL | Status: DC
  Administered 2014-09-20 – 2014-09-25 (×11): 100 mg via ORAL
  Filled 2014-09-20 (×4): qty 1
  Filled 2014-09-20: qty 4
  Filled 2014-09-20 (×9): qty 1
  Filled 2014-09-20: qty 4
  Filled 2014-09-20: qty 1

## 2014-09-20 NOTE — Progress Notes (Signed)
D.  Pt pleasant on approach, flat affect.  Denies complaints at this time.  Positive for evening wrap up group, interacting appropriately with peers on the unit.  Medications taken as ordered, Trazodone for sleep.  Denies SI/HI/hallucinations at this time. A.  Support and encouragement offered  R.  Pt remains safe on unit, will continue to monitor.

## 2014-09-20 NOTE — Progress Notes (Signed)
D: Patient is alert and oriented. Pt's mood and affect is pleasant upon interaction and appropriate to circumstance. Pt denies SI/HI and AVH. Pt rates depression 7/10, hopelessness 0/10, and anxiety 4/10. Pt reports his goal for the day is "take my medicine, set new goals for myself, get better while I am in here." Pt is attending unit groups. Pt complains of sexual dysfunction this morning, pt states "I can't get an erection." Pt experiencing hypotension, denies symptoms (See doc flowsheet-vitals). A: MD, Eappen made aware of pt's concerns. Pt encouraged to increase fluids. Active listening by RN. Scheduled medications administered per providers orders (See MAR). 15 minute checks continued per protocol for patient safety.  R: Patient cooperative and receptive to nursing interventions. Pt remains safe.

## 2014-09-20 NOTE — [facility] Group Notes (Signed)
[facility] Note   09/20/2014 11:08 AM  Participation Quality:  Minimal   Mood/Affect:  Flat  Depression Rating:  3  Anxiety Rating:  2  Thoughts of Suicide:  No Will you contract for safety?   NA  Current AVH:  No  Plan for Discharge/Comments:  Timothy GrandchildMarkie reports his depression is improving. He reports anxiety because he is shy and talking to groups of people makes him anxious. Pt plans to return home with his grandmother and follow up [facility].   Transportation Means: Bus   Supports: Family   Hyatt,Candace

## 2014-09-20 NOTE — [facility] Group Notes (Signed)
[facility] LCSW Group Therapy  09/20/2014 1:26 PM  Type of Therapy:  Group Therapy  Participation Level:  Did Not Attend  Summary of Progress/Problems:Feelings around Relapse. Group members discussed the meaning of relapse and shared personal stories of relapse, how it affected them and others, and how they perceived themselves during this time. Group members were encouraged to identify triggers, warning signs and coping skills used when facing the possibility of relapse. Social supports were discussed and explored in detail.   [name],Timothy 09/20/2014, 1:26 PM

## 2014-09-20 NOTE — Progress Notes (Signed)
[facility] MD Progress Note  09/20/2014 1:26 PM Timothy [name]  MRN:  [id number] Subjective:  Patient states " I think I have no erection, I feel fine otherwise.'  Objective; Patient seen and chart reviewed.Pt discussed with treatment team. Pt presented with psychosis, depression as well as side effects to antipsychotics that were started on an outpatient basis. Pt today continues to improve. Pt is minimal at baseline , responds to questions in monosyllables. Pt today denies any concerns or side effects of medications . Pt denies stiffness, tremors or rigidity, drooling.AIMS -0 Pt denies any new concerns , denies si/hi/ah/vh. Pt reports sleep as good, but per EHR ,sleep is documented as <2 hrs.This was the same yesterday , when his sleep was documented in EHR as low , pt denies any issues, unknown if this is true, given his negative sx and he being a poor historian. Will monitor.   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 Schizoaffective disorder, bipolar type [F25.0] 09/18/2014  . Tobacco use disorder [Z72.0] 09/18/2014  . 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 Cannabis use disorder, severe, dependence [F12.20] 09/17/2014   Total Time spent with patient: 30 minutes   Past Medical History:  Past Medical History  Diagnosis Date  . Paranoid schizophrenia   . Cannabis abuse   . Schizoaffective disorder, depressive type 09/17/2014   History reviewed. No pertinent past surgical history. Family History: History reviewed. No pertinent family history. Social History:  History  Alcohol Use No     History  Drug Use  . Yes  . Special: Marijuana    History   Social  History  . Marital Status: Single    Spouse Name: N/A  . Number of Children: N/A  . Years of Education: N/A   Social History Main Topics  . Smoking status: Never Smoker   . Smokeless tobacco: Not on file  . Alcohol Use: No  . Drug Use: Yes    Special: Marijuana  . Sexual Activity: No   Other Topics Concern  . None   Social History Narrative   Additional History:    Sleep: Fair  Appetite:  Fair     Musculoskeletal: Strength & Muscle Tone: within normal limits Gait & Station: normal Patient leans: N/A   Psychiatric Specialty Exam: Physical Exam  Review of Systems  Musculoskeletal: Negative for myalgias.  Neurological: Negative for tremors.  Psychiatric/Behavioral: The patient is nervous/anxious.     Blood pressure 131/58, pulse 85, temperature 97.4 F (36.3 C), temperature source Oral, resp. rate 16, height 6\' 3"  (1.905 m), weight 93.895 kg (207 lb).Body mass index is 25.87 kg/(m^2).  General Appearance: Fairly Groomed  Patent attorney::  Fair  Speech:  Clear and Coherent  Volume:  Decreased  Mood:  Dysphoric  Affect:  Blunt and but improving , smiles at times  Thought Process:  Goal Directed,but minimal interaction all together , speaks in monosyllables   Orientation:  Full (Time, Place, and Person)  Thought Content:  Rumination  Suicidal Thoughts:  No  Homicidal Thoughts:  No  Memory:  Immediate;   Fair Recent;   Fair Remote;   Fair  Judgement:  Impaired  Insight:  Fair  Psychomotor Activity:  Normal  Concentration:  Fair  Recall:  [employer]  of Knowledge:Fair  Language: Fair  Akathisia:  No  Handed:  Right  AIMS (if indicated):     Assets:  Physical Health Social Support  ADL's:  Intact  Cognition: WNL  Sleep:  Number of Hours: 1.25     Current Medications: Current Facility-Administered Medications  Medication Dose Route Frequency Provider Last Rate Last Dose  . acetaminophen (TYLENOL) tablet 650 mg  650 mg Oral Q6H PRN Kerry HoughSpencer E Simon, PA-C       . alum & mag hydroxide-simeth (MAALOX/MYLANTA) 200-200-20 MG/5ML suspension 30 mL  30 mL Oral Q4H PRN Kerry HoughSpencer E Simon, PA-C      . benztropine (COGENTIN) tablet 0.5 mg  0.5 mg Oral BID Kerry HoughSpencer E Simon, PA-C   0.5 mg at 09/20/14 0915  . divalproex (DEPAKOTE) DR tablet 500 mg  500 mg Oral Q12H Alix Lahmann, MD   500 mg at 09/20/14 0915  . hydrocerin (EUCERIN) cream   Topical TID Jomarie LongsSaramma Carolyna Yerian, MD      . hydrOXYzine (ATARAX/VISTARIL) tablet 25 mg  25 mg Oral Q6H PRN Kerry HoughSpencer E Simon, PA-C      . LORazepam (ATIVAN) tablet 1 mg  1 mg Oral Q6H PRN Jomarie LongsSaramma Jaye Saal, MD       Or  . LORazepam (ATIVAN) injection 1 mg  1 mg Intramuscular Q6H PRN Sargun Rummell, MD      . magnesium hydroxide (MILK OF MAGNESIA) suspension 30 mL  30 mL Oral Daily PRN Kerry HoughSpencer E Simon, PA-C      . nicotine (NICODERM CQ - dosed in mg/24 hours) patch 21 mg  21 mg Transdermal Daily Jomarie LongsSaramma Nakhi Choi, MD   21 mg at 09/20/14 0916  . nitrofurantoin (macrocrystal-monohydrate) (MACROBID) capsule 100 mg  100 mg Oral Q12H Avi Kerschner, MD      . QUEtiapine (SEROQUEL XR) 24 hr tablet 200 mg  200 mg Oral QHS Brigetta Beckstrom, MD      . traZODone (DESYREL) tablet 50 mg  50 mg Oral QHS,MR X 1 Kerry HoughSpencer E Simon, PA-C   50 mg at 09/19/14 2215    Lab Results:  Results for orders placed or performed during the hospital encounter of 09/18/14 (from the past 48 hour(s))  TSH     Status: None   Collection Time: 09/19/14  6:44 AM  Result Value Ref Range   TSH 0.565 0.350 - 4.500 uIU/mL    Comment: [facility]  Lipid panel     Status: Abnormal   Collection Time: 09/19/14  6:44 AM  Result Value Ref Range   Cholesterol 169 0 - 200 mg/dL   Triglycerides 68 <161<150 mg/dL   HDL 55 >09>39 mg/dL   Total CHOL/HDL Ratio 3.1 RATIO   VLDL 14 0 - 40 mg/dL   LDL Cholesterol 604100 (H) 0 - 99 mg/dL    Comment:        Total Cholesterol/HDL:CHD Risk Coronary Heart Disease Risk Table                     Men   Women  1/2 Average  Risk   3.4   3.3  Average Risk       5.0   4.4  2 X Average Risk   9.6   7.1  3 X Average Risk  23.4   11.0        Use the calculated Patient Ratio above and the CHD Risk Table to determine the patient's CHD Risk.  ATP III CLASSIFICATION (LDL):  <100     mg/dL   Optimal  161-096  mg/dL   Near or Above                    Optimal  130-159  mg/dL   Borderline  045-409  mg/dL   High  >811     mg/dL   Very High Performed at [facility]   Hemoglobin A1c     Status: None   Collection Time: 09/19/14  6:44 AM  Result Value Ref Range   Hgb A1c MFr Bld 5.5 4.8 - 5.6 %    Comment: (NOTE)         Pre-diabetes: 5.7 - 6.4         Diabetes: >6.4         Glycemic control for adults with diabetes: <7.0    Mean Plasma Glucose 111 mg/dL    Comment: (NOTE) Performed At: [facility] [address] [place], [place] [id number] Mila Homer MD [id number] Performed at [facility]   Urinalysis, Routine w reflex microscopic     Status: Abnormal   Collection Time: 09/19/14  3:28 PM  Result Value Ref Range   Color, Urine YELLOW YELLOW   APPearance CLEAR CLEAR   Specific Gravity, Urine 1.016 1.005 - 1.030   pH 6.5 5.0 - 8.0   Glucose, UA NEGATIVE NEGATIVE mg/dL   Hgb urine dipstick NEGATIVE NEGATIVE   Bilirubin Urine NEGATIVE NEGATIVE   Ketones, ur NEGATIVE NEGATIVE mg/dL   Protein, ur NEGATIVE NEGATIVE mg/dL   Urobilinogen, UA 0.2 0.0 - 1.0 mg/dL   Nitrite NEGATIVE NEGATIVE   Leukocytes, UA TRACE (A) NEGATIVE    Comment: Performed at [facility]  Urine microscopic-add on     Status: Abnormal   Collection Time: 09/19/14  3:28 PM  Result Value Ref Range   Squamous Epithelial / LPF RARE RARE   WBC, UA 7-10 <3 WBC/hpf   RBC / HPF 0-2 <3 RBC/hpf   Bacteria, UA FEW (A) RARE    Comment: Performed at [facility]    Physical Findings: AIMS: Facial and Oral Movements Muscles of Facial Expression: None,  normal Lips and Perioral Area: None, normal Jaw: None, normal Tongue: None, normal,Extremity Movements Upper (arms, wrists, hands, fingers): None, normal Lower (legs, knees, ankles, toes): None, normal, Trunk Movements Neck, shoulders, hips: None, normal, Overall Severity Severity of abnormal movements (highest score from questions above): None, normal Incapacitation due to abnormal movements: None, normal Patient's awareness of abnormal movements (rate only patient's report): No Awareness, Dental Status Current problems with teeth and/or dentures?: No Does patient usually wear dentures?: No  CIWA:  CIWA-Ar Total: 1 COWS:  COWS Total Score: 1   Assessment: Patient is a 69 y old AAM with hx of schizoaffective disorder , presented with negative sx as well as side effects to antipsychotics . Pt also with sleep as low >2hrs noted in EHR , but he denies this. Will continue to monitor.  Treatment Plan Summary: Daily contact with patient to assess and evaluate symptoms and progress in treatment and Medication management Will increase Seroquel XR to 200 mg po qhs for psychosis, it has a low EPS profile and is better suited for this patient. Will monitor his tremors and continue Cogentin. Will continue Depakote 500 mg po bid for mood lability.Depakote level on 09/22/14. Ativan 1 mg po prn/IM prn for anxiety agitation. Will continue to monitor vitals ,medication  compliance and treatment side effects while patient is here.   Reviewed labs .UA -abnormal - pt reports some urinary sx - will start Macrobid. CSW will start working on disposition.  Patient to participate in therapeutic milieu .    Medical Decision Making:  Review of Psycho-Social Stressors (1), Review or order clinical lab tests (1), Review and summation of old records (2), Review of Last Therapy Session (1), Review or order medicine tests (1), Review of Medication Regimen & Side Effects (2) and Review of New Medication or Change in  Dosage (2)     Arbadella Kimbler MD 09/20/2014, 1:26 PM

## 2014-09-21 DIAGNOSIS — F250 Schizoaffective disorder, bipolar type: Principal | ICD-10-CM

## 2014-09-21 DIAGNOSIS — G2111 Neuroleptic induced parkinsonism: Secondary | ICD-10-CM

## 2014-09-21 DIAGNOSIS — F1290 Cannabis use, unspecified, uncomplicated: Secondary | ICD-10-CM

## 2014-09-21 DIAGNOSIS — Z720 Tobacco use: Secondary | ICD-10-CM

## 2014-09-21 NOTE — Progress Notes (Signed)
[facility] Progress [facility]  09/21/2014 12:21 PM Timothy [name]  MRN:  [id number] Subjective:  Patient states he is depressed and level is 5/10. Denies anhedonia and hopelessness. Appetite is good. Sleep is ok and he got about 6 hrs. Denies SI/HI, AVH. Taking meds as prescribed and denies SE. States meds help balance his mood.   Objective; Patient seen and chart reviewed.  Pt presented with psychosis, depression as well as side effects to antipsychotics that were started on an outpatient basis. Pt today continues to improve. Pt is minimal at baseline, responds to questions in monosyllables and affect is blunt. He is interactive on the mileu. Pt today denies any concerns or side effects of medications . Pt denies stiffness, tremors or rigidity, drooling.AIMS -0 Pt denies any new concerns , denies si/hi/ah/vh. Pt reports sleep as good, but per EHR ,sleep is documented as <2 hrs.This was the same yesterday , when his sleep was documented in EHR as low , pt denies any issues, unknown if this is true, given his negative sx and he being a poor historian. Will monitor.   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 Schizoaffective disorder, bipolar type [F25.0] 09/18/2014  . Tobacco use disorder [Z72.0] 09/18/2014  . 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 Cannabis use disorder, severe, dependence [F12.20] 09/17/2014   Total Time spent with patient: 30 minutes   Past Medical History:  Past Medical History  Diagnosis Date  . Paranoid schizophrenia   . Cannabis abuse   . Schizoaffective disorder, depressive type 09/17/2014   History reviewed. No pertinent past surgical history. Family  History: History reviewed. No pertinent family history. Social History:  History  Alcohol Use No     History  Drug Use  . Yes  . Special: Marijuana    History   Social History  . Marital Status: Single    Spouse Name: N/A  . Number of Children: N/A  . Years of Education: N/A   Social History Main Topics  . Smoking status: Never Smoker   . Smokeless tobacco: Not on file  . Alcohol Use: No  . Drug Use: Yes    Special: Marijuana  . Sexual Activity: No   Other Topics Concern  . None   Social History Narrative   Additional History:    Sleep: Fair  Appetite:  Fair     Musculoskeletal: Strength & Muscle Tone: within normal limits Gait & Station: normal Patient leans: N/A   Psychiatric Specialty Exam: Physical Exam   Review of Systems  HENT: Negative for ear pain and sore throat.   Eyes: Negative for pain.  Cardiovascular: Negative for chest pain.  Gastrointestinal: Negative for abdominal pain.  Musculoskeletal: Negative for back pain, joint pain and neck pain.  Skin: Negative for itching and rash.  Neurological: Negative for dizziness, tingling, tremors, seizures, loss of consciousness, weakness and headaches.  Psychiatric/Behavioral: Positive for depression. Negative for suicidal ideas, hallucinations and substance abuse. The patient is not nervous/anxious and does not have insomnia.     Blood pressure 131/83, pulse 109, temperature 98.1 F (36.7 C), temperature source Oral, resp. rate 15, height 6\' 3"  (1.905 m), weight 93.895 kg (207 lb).Body mass index is 25.87 kg/(m^2).  General Appearance: Casual  Eye Contact::  Fair  Speech:  Clear and Coherent  Volume:  Decreased  Mood:  Dysphoric  Affect:  Blunt  Thought Process:  Goal Directed,but minimal interaction all together , speaks in monosyllables   Orientation:  Full (Time, Place, and Person)  Thought Content:  Negative  Suicidal Thoughts:  No  Homicidal Thoughts:  No  Memory:  Immediate;    Fair Recent;   Fair Remote;   Fair  Judgement:  Impaired  Insight:  Fair  Psychomotor Activity:  Normal  Concentration:  Fair  Recall:  [employer] of Knowledge:Fair  Language: Fair  Akathisia:  No  Handed:  Right  AIMS (if indicated):     Assets:  Physical Health Social Support  ADL's:  Intact  Cognition: WNL  Sleep:  Number of Hours: 1.25     Current Medications: Current Facility-Administered Medications  Medication Dose Route Frequency Provider Last Rate Last Dose  . acetaminophen (TYLENOL) tablet 650 mg  650 mg Oral Q6H PRN Kerry Hough, PA-C      . alum & mag hydroxide-simeth (MAALOX/MYLANTA) 200-200-20 MG/5ML suspension 30 mL  30 mL Oral Q4H PRN Kerry Hough, PA-C      . benztropine (COGENTIN) tablet 0.5 mg  0.5 mg Oral BID Kerry Hough, PA-C   0.5 mg at 09/21/14 1005  . divalproex (DEPAKOTE) DR tablet 500 mg  500 mg Oral Q12H Saramma Eappen, MD   500 mg at 09/21/14 1005  . hydrocerin (EUCERIN) cream   Topical TID Jomarie Longs, MD      . hydrOXYzine (ATARAX/VISTARIL) tablet 25 mg  25 mg Oral Q6H PRN Kerry Hough, PA-C      . LORazepam (ATIVAN) tablet 1 mg  1 mg Oral Q6H PRN Jomarie Longs, MD       Or  . LORazepam (ATIVAN) injection 1 mg  1 mg Intramuscular Q6H PRN Saramma Eappen, MD      . magnesium hydroxide (MILK OF MAGNESIA) suspension 30 mL  30 mL Oral Daily PRN Kerry Hough, PA-C      . nicotine (NICODERM CQ - dosed in mg/24 hours) patch 21 mg  21 mg Transdermal Daily Saramma Eappen, MD   21 mg at 09/21/14 1006  . nitrofurantoin (macrocrystal-monohydrate) (MACROBID) capsule 100 mg  100 mg Oral Q12H Saramma Eappen, MD   100 mg at 09/21/14 1005  . QUEtiapine (SEROQUEL XR) 24 hr tablet 200 mg  200 mg Oral QHS Jomarie Longs, MD   200 mg at 09/20/14 2108  . traZODone (DESYREL) tablet 50 mg  50 mg Oral QHS,MR X 1 Kerry Hough, PA-C   50 mg at 09/20/14 2108    Lab Results:  Results for orders placed or performed during the hospital encounter of  09/18/14 (from the past 48 hour(s))  Urinalysis, Routine w reflex microscopic     Status: Abnormal   Collection Time: 09/19/14  3:28 PM  Result Value Ref Range   Color, Urine YELLOW YELLOW   APPearance CLEAR CLEAR   Specific Gravity, Urine 1.016 1.005 - 1.030   pH 6.5 5.0 - 8.0   Glucose, UA NEGATIVE NEGATIVE mg/dL   Hgb urine dipstick NEGATIVE NEGATIVE   Bilirubin Urine NEGATIVE NEGATIVE   Ketones, ur NEGATIVE NEGATIVE mg/dL   Protein, ur NEGATIVE NEGATIVE mg/dL   Urobilinogen, UA 0.2 0.0 - 1.0 mg/dL   Nitrite NEGATIVE NEGATIVE   Leukocytes, UA TRACE (A) NEGATIVE    Comment: Performed at [facility]  Urine microscopic-add on  Status: Abnormal   Collection Time: 09/19/14  3:28 PM  Result Value Ref Range   Squamous Epithelial / LPF RARE RARE   WBC, UA 7-10 <3 WBC/hpf   RBC / HPF 0-2 <3 RBC/hpf   Bacteria, UA FEW (A) RARE    Comment: Performed at [facility]    Physical Findings: AIMS: Facial and Oral Movements Muscles of Facial Expression: None, normal Lips and Perioral Area: None, normal Jaw: None, normal Tongue: None, normal,Extremity Movements Upper (arms, wrists, hands, fingers): None, normal Lower (legs, knees, ankles, toes): None, normal, Trunk Movements Neck, shoulders, hips: None, normal, Overall Severity Severity of abnormal movements (highest score from questions above): None, normal Incapacitation due to abnormal movements: None, normal Patient's awareness of abnormal movements (rate only patient's report): No Awareness, Dental Status Current problems with teeth and/or dentures?: No Does patient usually wear dentures?: No  CIWA:  CIWA-Ar Total: 1 COWS:  COWS Total Score: 1   Assessment: Patient is a 67 y old AAM with hx of schizoaffective disorder , presented with negative sx as well as side effects to antipsychotics . Pt also with sleep as low >2hrs noted in EHR , but he denies this. Will continue to  monitor.  Treatment Plan Summary: Daily contact with patient to assess and evaluate symptoms and progress in treatment and Medication management Seroquel XR to 200 mg po qhs for psychosis, it has a low EPS profile and is better suited for this patient. Will monitor his tremors and continue Cogentin. Will continue Depakote 500 mg po bid for mood lability.Depakote level on 09/22/14. Ativan 1 mg po prn/IM prn for anxiety agitation. Will continue to monitor vitals ,medication compliance and treatment side effects while patient is here.   Reviewed labs .UA -abnormal - pt reports some urinary sx - will start Macrobid. CSW will start working on disposition.  Patient to participate in therapeutic milieu .    Medical Decision Making:  Review of Psycho-Social Stressors (1), Review or order clinical lab tests (1) and Review of Medication Regimen & Side Effects (2)     Oletta Darter MD 09/21/2014, 12:21 PM

## 2014-09-21 NOTE — Progress Notes (Signed)
Patient ID: Timothy [name], male   DOB: 06/17/1996, 19 y.o.   MRN: [id number]   D: Pt has been appropriate on the unit today, he attended all groups and engaged in treatment. Pt took all medications as prescribed by the doctor. Pt reported that his depression was a 5, his hopelessness was a 5, and that his anxiety was a 0. Pt reported that his goal was just to take his medication, stay positive, and to work on his self. Pt reported being negative SI/HI, no AH/VH noted. A: 15 min checks continued for patient safety. R: Pt safety maintained.

## 2014-09-21 NOTE — Progress Notes (Signed)
D.  Pt pleasant on approach, denies complaints at this time.  Would like to know plan for discharge.  Positive for evening wrap up group, with appropriate participation.  See group notes.  Interacting appropriately with peers on the unit. Pt more verbal this evening than last.  Denies SI/HI/hallucinations at this time.  A.  Support and encouragement offered.  R.  Pt remains safe on the unit, will continue to monitor.

## 2014-09-21 NOTE — Progress Notes (Signed)
The focus of this group is to help patients review their daily goal of treatment and discuss progress on daily workbooks. Pt attended the evening group session and responded to all discussion prompts from the Writer. Pt reported having had a good day, though he was admittedly ready to go home. "I feel like I'm doing better and feeling better, but I understand if they want to keep me for a few more days to make sure I'm not still depressed." When asked, Pt reported having no additional requests from Nursing Staff this evening. Pt's affect was appropriate.

## 2014-09-21 NOTE — [facility] Group Notes (Addendum)
[facility] Group Notes:  (Clinical Social Work)  09/21/2014  11:15-12:00PM  Summary of Progress/Problems:   The main focus of today's process group was to discuss patients' feelings about hospitalization, the stigma attached to mental health, and sources of motivation to stay well.  We then worked to identify a specific plan to avoid future hospitalizations when discharged from the hospital for this admission.  The patient expressed that he is having an excellent day, is working on his recovery, was engaged and pleasant, humorous.  Type of Therapy:  Group Therapy - Process  Participation Level:  Active  Participation Quality:  Attentive and Sharing  Affect:  Blunted  Cognitive:  Appropriate  Insight:  Engaged  Engagement in Therapy:  Engaged  Modes of Intervention:  Exploration, Discussion  Ambrose MantleMareida Grossman-Orr, LCSW 09/21/2014, 1:17 PM

## 2014-09-22 LAB — VALPROIC ACID LEVEL: VALPROIC ACID LVL: 80 ug/mL (ref 50.0–100.0)

## 2014-09-22 NOTE — Progress Notes (Signed)
Patient ID: Timothy FeltyMarkie T [name], male   DOB: 04/16/1996, [age] y.o.   MRN: [id number]   D: Pt has been appropriate on the unit today, he attended all groups and engaged in treatment. Pt took all medications as prescribed by the doctor. Pt reported that his depression was a 5, his hopelessness was a 5, and that his anxiety was a 1. Pt reported that his goal was just to take his medication, stay positive, and to work on his self. Pt reported being negative SI/HI, no AH/VH noted. A: 15 min checks continued for patient safety. R: Pt safety maintained.

## 2014-09-22 NOTE — Progress Notes (Signed)
D.  Pt pleasant on approach, would like to know plans for discharge.  Denies SI/HI/hallucinations at this time.  Positive for evening wrap up group with appropriate participation.  Interacting appropriately with peers on the unit.  A.  Support and encouragement offered  R.  Pt remains safe, will continue to monitor.

## 2014-09-22 NOTE — Progress Notes (Signed)
[facility]  09/22/2014 12:42 PM Timothy [name]  MRN:  [id number] Subjective:  Patient states he is getting better. He is less anxious. Depression is the same (level is 5/10). States he used to smoke THC to get over depression.  Appetite and energy are fair. Sleep is ok and he got about 6 hrs. Denies SI/HI, AVH. Taking meds as prescribed and denies SE. States meds help balance his mood.   For last couple of months he has not been to get or maintain an erection. States problem began before starting these meds. He is able to urinate without problem. Pt is agreeable to f/up appt on d/c with Urology.   Objective; Patient seen and chart reviewed.  Pt presented with psychosis, depression as well as side effects to antipsychotics that were started on an outpatient basis. Pt today continues to improve. He is attendign groups and pleasant and cooperative. Pt is minimal at baseline, responds to questions in sentences and affect is blunt. He is interactive on the mileu. Pt today denies any concerns or side effects of medications . Pt denies stiffness, tremors or rigidity, drooling.AIMS -0 Pt denies any new concerns , denies si/hi/ah/vh. Pt reports sleep as good, but per EHR ,sleep is documented as <2 hrs.This was the same yesterday , when his sleep was documented in EHR as low , pt denies any issues, unknown if this is true, given his negative sx and he being a poor historian. Will monitor.   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 Schizoaffective disorder, bipolar type [F25.0] 09/18/2014  . Tobacco use disorder [Z72.0] 09/18/2014  . 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  Cannabis use disorder, severe, dependence [F12.20] 09/17/2014   Total Time spent with patient: 30 minutes   Past Medical History:  Past Medical History  Diagnosis Date  . Paranoid schizophrenia   . Cannabis abuse   . Schizoaffective disorder, depressive type 09/17/2014   History reviewed. No pertinent past surgical history. Family History: History reviewed. No pertinent family history. Social History:  History  Alcohol Use No     History  Drug Use  . Yes  . Special: Marijuana    History   Social History  . Marital Status: Single    Spouse Name: N/A  . Number of Children: N/A  . Years of Education: N/A   Social History Main Topics  . Smoking status: Never Smoker   . Smokeless tobacco: Not on file  . Alcohol Use: No  . Drug Use: Yes    Special: Marijuana  . Sexual Activity: No   Other Topics Concern  . None   Social History Narrative   Additional History:    Sleep: Fair  Appetite:  Fair     Musculoskeletal: Strength & Muscle Tone: within normal limits Gait & Station: normal Patient leans: N/A   Psychiatric Specialty Exam: Physical Exam  Psychiatric: His speech is normal and behavior is normal. Judgment and thought content normal. His mood appears anxious. Cognition and memory are normal. He exhibits a depressed mood.    Review of Systems  Genitourinary: Negative for urgency, frequency and flank pain.  Musculoskeletal: Positive for back pain.  Psychiatric/Behavioral: Positive for depression. Negative for suicidal ideas, hallucinations and substance abuse. The patient  is nervous/anxious. The patient does not have insomnia.     Blood pressure 120/73, pulse 127, temperature 98.9 F (37.2 C), temperature source Oral, resp. rate 18, height  (1.905 m), weight 93.895 kg (207 lb).Body mass index is 25.87 kg/(m^2).  General Appearance: Casual  Eye Contact::  Fair  Speech:  Clear and Coherent  Volume:  Decreased  Mood:  Dysphoric  Affect:  Blunt- but  smiled at the end of today's session  Thought Process:  Goal Directed, more interaction all together , speaks in sentences now  Orientation:  Full (Time, Place, and Person)  Thought Content:  Negative  Suicidal Thoughts:  No  Homicidal Thoughts:  No  Memory:  Immediate;   Fair Recent;   Fair Remote;   Fair  Judgement:  Impaired  Insight:  Fair  Psychomotor Activity:  Normal  Concentration:  Fair  Recall:  [employer] of Knowledge:Fair  Language: Fair  Akathisia:  No  Handed:  Right  AIMS (if indicated):     Assets:  Physical Health Social Support  ADL's:  Intact  Cognition: WNL  Sleep:  Number of Hours: 1.25     Current Medications: Current Facility-Administered Medications  Medication Dose Route Frequency Provider Last Rate Last Dose  . acetaminophen (TYLENOL) tablet 650 mg  650 mg Oral Q6H PRN Kerry Hough, PA-C      . alum & mag hydroxide-simeth (MAALOX/MYLANTA) 200-200-20 MG/5ML suspension 30 mL  30 mL Oral Q4H PRN Kerry Hough, PA-C      . benztropine (COGENTIN) tablet 0.5 mg  0.5 mg Oral BID Kerry Hough, PA-C   0.5 mg at 09/22/14 1040  . divalproex (DEPAKOTE) DR tablet 500 mg  500 mg Oral Q12H Saramma Eappen, MD   500 mg at 09/22/14 1041  . hydrocerin (EUCERIN) cream   Topical TID Jomarie Longs, MD      . hydrOXYzine (ATARAX/VISTARIL) tablet 25 mg  25 mg Oral Q6H PRN Kerry Hough, PA-C      . LORazepam (ATIVAN) tablet 1 mg  1 mg Oral Q6H PRN Jomarie Longs, MD       Or  . LORazepam (ATIVAN) injection 1 mg  1 mg Intramuscular Q6H PRN Saramma Eappen, MD      . magnesium hydroxide (MILK OF MAGNESIA) suspension 30 mL  30 mL Oral Daily PRN Kerry Hough, PA-C      . nicotine (NICODERM CQ - dosed in mg/24 hours) patch 21 mg  21 mg Transdermal Daily Saramma Eappen, MD   21 mg at 09/22/14 1042  . nitrofurantoin (macrocrystal-monohydrate) (MACROBID) capsule 100 mg  100 mg Oral Q12H Saramma Eappen, MD   100 mg at 09/22/14 1043  . QUEtiapine (SEROQUEL XR) 24 hr  tablet 200 mg  200 mg Oral QHS Jomarie Longs, MD   200 mg at 09/21/14 2135  . traZODone (DESYREL) tablet 50 mg  50 mg Oral QHS,MR X 1 Kerry Hough, PA-C   50 mg at 09/21/14 2135    Lab Results:  Results for orders placed or performed during the hospital encounter of 09/18/14 (from the past 48 hour(s))  Valproic acid level     Status: None   Collection Time: 09/22/14  6:48 AM  Result Value Ref Range   Valproic Acid Lvl 80.0 50.0 - 100.0 ug/mL    Comment: Performed at [facility]    Physical Findings: AIMS: Facial and Oral Movements Muscles of Facial Expression: None, normal Lips and Perioral Area: None,  normal Jaw: None, normal Tongue: None, normal,Extremity Movements Upper (arms, wrists, hands, fingers): None, normal Lower (legs, knees, ankles, toes): None, normal, Trunk Movements Neck, shoulders, hips: None, normal, Overall Severity Severity of abnormal movements (highest score from questions above): None, normal Incapacitation due to abnormal movements: None, normal Patient's awareness of abnormal movements (rate only patient's report): No Awareness, Dental Status Current problems with teeth and/or dentures?: No Does patient usually wear dentures?: No  CIWA:  CIWA-Ar Total: 1 COWS:  COWS Total Score: 1   Assessment: Patient is a 49 y old AAM with hx of schizoaffective disorder , presented with negative sx as well as side effects to antipsychotics . Pt also with sleep as low >2hrs noted in EHR , but he denies this. Will continue to monitor.  Treatment Plan Summary: Daily contact with patient to assess and evaluate symptoms and progress in treatment and Medication management Seroquel XR to 200 mg po qhs for psychosis, it has a low EPS profile and is better suited for this patient. Will monitor his tremors and continue Cogentin. Will continue Depakote 500 mg po bid for mood lability.Depakote level on 09/22/14= 80 Ativan 1 mg po prn/IM prn for anxiety agitation. Will  continue to monitor vitals ,medication compliance and treatment side effects while patient is here.   Reviewed labs .UA -abnormal - pt reports some urinary sx - will start Macrobid. CSW will start working on disposition.  Patient to participate in therapeutic milieu .  Pt reports inability to obtain an erection- agreeable to appt with Urologist on d/c.   Medical Decision Making:  Established Problem, Stable/Improving (1), Review of Psycho-Social Stressors (1), Review or order clinical lab tests (1), New Problem, with no additional work-up planned (3) and Review of Medication Regimen & Side Effects (2)     Oletta Darter MD 09/22/2014, 12:42 PM

## 2014-09-22 NOTE — [facility] Group Notes (Signed)
The focus of this group is to educate the patient on the purpose and policies of crisis stabilization and provide a format to answer questions about their admission.  The group details unit policies and expectations of patients while admitted.  Patient did not attend 0900 nurse education orientation group this morning.  Patient stayed in bed.   

## 2014-09-22 NOTE — [facility] Group Notes (Signed)
[facility] Group Notes:  (Clinical Social Work)  09/22/2014   11:15am-12:00pm  Summary of Progress/Problems:  The main focus of today's process group was to listen to a variety of genres of music and to identify that different types of music provoke different responses.  The patient then was able to identify personally what was soothing for them, as well as energizing.  Handouts were used to record feelings evoked, as well as how patient can personally use this knowledge in sleep habits, with depression, and with other symptoms.  The patient expressed understanding of concepts, as well as knowledge of how each type of music affected him/her and how this can be used at home as a wellness/recovery tool.  He colored during part of group, but sang along with the music.  Later, he clapped his hands and continued to sing along.  He smiled often, but between smiles had a blunted affect.  Type of Therapy:  Music Therapy   Participation Level:  Active  Participation Quality:  Attentive and Sharing  Affect:  Blunted  Cognitive:  Oriented  Insight:  Engaged  Engagement in Therapy:  Engaged  Modes of Intervention:   Activity, Exploration  Ambrose MantleMareida Grossman-Orr, LCSW 09/22/2014, 12:30pm

## 2014-09-23 MED ORDER — CITALOPRAM HYDROBROMIDE 10 MG PO TABS
10.0000 mg | ORAL_TABLET | Freq: Every day | ORAL | Status: DC
  Administered 2014-09-23 – 2014-09-24 (×2): 10 mg via ORAL
  Filled 2014-09-23 (×4): qty 1

## 2014-09-23 NOTE — Progress Notes (Signed)
Patient ID: Timothy [name], Timothy [name]   DOB: 07/29/1995, 19 y.o.   MRN: [id number] D: Client visible on the unit, noted in the dayroom watching TV. Client is quiet, pleasant and minimal. Client denies AVH, SHI. Client says he was admitted due to "back pain" which he currently denies. A: Writer introduced self to client, provides emotional support, encouraged client to report any side effects of medications. Staff will monitor q4215min for safety. R: Client is safe on the unit, attends group.

## 2014-09-23 NOTE — Progress Notes (Signed)
[facility]  09/23/2014 11:35 AM Timothy [name]  MRN:  [id number] Subjective:  Patient states " I am ok, I feel better , I do not have any side effects of the medications, I still feel depressed."   Objective; Patient seen and chart reviewed.Reviewed notes from weekend provider.  Pt presented with psychosis, depression as well as side effects to antipsychotics that were started on an outpatient basis. Pt today with improvement of Timothy [name] sx. Pt however continues to speak in monosyllables , smiles inappropriately at times , stares at the writer , nodding Timothy [name] head answering "yes' or "no' to questions asked. He is not able to elaborate on the way he feels or how the medications help. Pt denies stiffness, tremors or rigidity, drooling.AIMS -0 Pt reports Timothy [name] major issue as depression. Will add an SSRI to augment the effect of other medications. Patient had improved sleep last night. Pt encouraged to attend groups , take medications.       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 Schizoaffective disorder, bipolar type [F25.0] 09/18/2014  . Tobacco use disorder [Z72.0] 09/18/2014  . 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 Cannabis use disorder, severe, dependence [F12.20] 09/17/2014   Total Time spent with patient: 30 minutes   Past Medical History:  Past Medical History  Diagnosis Date  . Paranoid schizophrenia   . Cannabis abuse   . Schizoaffective disorder, depressive type 09/17/2014   History reviewed. No pertinent past surgical history. Family History: History reviewed. No pertinent family history. Social History:  History  Alcohol Use No     History   Drug Use  . Yes  . Special: Marijuana    History   Social History  . Marital Status: Single    Spouse Name: N/A  . Number of Children: N/A  . Years of Education: N/A   Social History Main Topics  . Smoking status: Never Smoker   . Smokeless tobacco: Not on file  . Alcohol Use: No  . Drug Use: Yes    Special: Marijuana  . Sexual Activity: No   Other Topics Concern  . None   Social History Narrative   Additional History:    Sleep: Fair  Appetite:  Fair     Musculoskeletal: Strength & Muscle Tone: within normal limits Gait & Station: normal Patient leans: N/A   Psychiatric Specialty Exam: Physical Exam  Psychiatric: Timothy [name] speech is normal and behavior is normal. Judgment and thought content normal. Timothy [name] mood appears anxious. Cognition and memory are normal. He exhibits a depressed mood.    Review of Systems  Psychiatric/Behavioral: Positive for depression.    Blood pressure 120/73, pulse 127, temperature 98.9 F (37.2 C), temperature source Oral, resp. rate 18, height 6\' 3"  (1.905 m), weight 93.895 kg (207 lb).Body mass index is 25.87 kg/(m^2).  General Appearance: Casual  Eye Contact::  Fair  Speech:  Normal Rate  Volume:  Decreased  Mood:  Dysphoric  Affect:  Blunt smiles inappropriately at times  Thought Process:  Goal Directed, speaks in monosyllables  Orientation:  Full (Time, Place, and Person)  Thought Content:  Negative  Suicidal Thoughts:  No  Homicidal Thoughts:  No  Memory:  Immediate;   Fair Recent;   Fair Remote;   Fair  Judgement:  Impaired  Insight:  Fair  Psychomotor Activity:  Normal  Concentration:  Fair  Recall:  [employer] of Knowledge:Fair  Language: Fair  Akathisia:  No  Handed:  Right  AIMS (if indicated):     Assets:  Physical Health Social Support  ADL's:  Intact  Cognition: WNL  Sleep:  Number of Hours: 6.5     Current Medications: Current Facility-Administered Medications  Medication Dose Route Frequency  Provider Last Rate Last Dose  . acetaminophen (TYLENOL) tablet 650 mg  650 mg Oral Q6H PRN Kerry Hough, PA-C      . alum & mag hydroxide-simeth (MAALOX/MYLANTA) 200-200-20 MG/5ML suspension 30 mL  30 mL Oral Q4H PRN Kerry Hough, PA-C      . benztropine (COGENTIN) tablet 0.5 mg  0.5 mg Oral BID Kerry Hough, PA-C   0.5 mg at 09/23/14 1610  . citalopram (CELEXA) tablet 10 mg  10 mg Oral Daily Nuala Chiles, MD      . divalproex (DEPAKOTE) DR tablet 500 mg  500 mg Oral Q12H Daleen Steinhaus, MD   500 mg at 09/23/14 0738  . hydrocerin (EUCERIN) cream   Topical TID Jomarie Longs, MD      . hydrOXYzine (ATARAX/VISTARIL) tablet 25 mg  25 mg Oral Q6H PRN Kerry Hough, PA-C      . LORazepam (ATIVAN) tablet 1 mg  1 mg Oral Q6H PRN Jomarie Longs, MD       Or  . LORazepam (ATIVAN) injection 1 mg  1 mg Intramuscular Q6H PRN Killian Schwer, MD      . magnesium hydroxide (MILK OF MAGNESIA) suspension 30 mL  30 mL Oral Daily PRN Kerry Hough, PA-C      . nicotine (NICODERM CQ - dosed in mg/24 hours) patch 21 mg  21 mg Transdermal Daily Camron Essman, MD   21 mg at 09/23/14 0736  . nitrofurantoin (macrocrystal-monohydrate) (MACROBID) capsule 100 mg  100 mg Oral Q12H Rodriques Badie, MD   100 mg at 09/23/14 0815  . QUEtiapine (SEROQUEL XR) 24 hr tablet 200 mg  200 mg Oral QHS Jomarie Longs, MD   200 mg at 09/22/14 2125  . traZODone (DESYREL) tablet 50 mg  50 mg Oral QHS,MR X 1 Kerry Hough, PA-C   50 mg at 09/22/14 2126    Lab Results:  Results for orders placed or performed during the hospital encounter of 09/18/14 (from the past 48 hour(s))  Valproic acid level     Status: None   Collection Time: 09/22/14  6:48 AM  Result Value Ref Range   Valproic Acid Lvl 80.0 50.0 - 100.0 ug/mL    Comment: Performed at [facility]    Physical Findings: AIMS: Facial and Oral Movements Muscles of Facial Expression: None, normal Lips and Perioral Area: None, normal Jaw: None,  normal Tongue: None, normal,Extremity Movements Upper (arms, wrists, hands, fingers): None, normal Lower (legs, knees, ankles, toes): None, normal, Trunk Movements Neck, shoulders, hips: None, normal, Overall Severity Severity of abnormal movements (highest score from questions above): None, normal Incapacitation due to abnormal movements: None, normal Patient's awareness of abnormal movements (rate only patient's report): No Awareness, Dental Status Current problems with teeth and/or dentures?: No Does patient usually wear dentures?: No  CIWA:  CIWA-Ar Total: 1 COWS:  COWS Total Score: 1   Assessment: Patient is a 13 y old AAM with hx of schizoaffective disorder , presented with negative sx as well as side effects to  antipsychotics . Pt per staff continues to be depressed , will readjust Timothy [name] medications.  Treatment Plan Summary: Daily contact with patient to assess and evaluate symptoms and progress in treatment and Medication management Seroquel XR  200 mg po qhs for psychosis, it has a low EPS profile and is better suited for this patient. Will monitor Timothy [name] tremors and continue Cogentin. Will add Celexa 10 mg po daily. Will continue Depakote 500 mg po bid for mood lability.Depakote level on 09/22/14= 80 Ativan 1 mg po prn/IM prn for anxiety agitation. Will continue to monitor vitals ,medication compliance and treatment side effects while patient is here.   Continue Macrobid for urinary sx. CSW will work on  disposition.  Patient to participate in therapeutic milieu .  Pt reports inability to obtain an erection- agreeable to appt with Urologist on d/c.   Medical Decision Making:  Review of Psycho-Social Stressors (1), Review or order clinical lab tests (1), New Problem, with no additional work-up planned (3), Review of Medication Regimen & Side Effects (2) and Review of New Medication or Change in Dosage (2)     Vinicius Brockman MD 09/23/2014, 11:35 AM

## 2014-09-23 NOTE — Progress Notes (Signed)
D:Pt is pleasant and smiles during a conversation. 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 He rates his depression as a 3 and anxiety as a 7 on 1-10 scale with 10 being the most.  A:Offered support, encouragement and 15 minute checks. R:Pt denies si and hi. Safety maintained on the unit.

## 2014-09-23 NOTE — [facility] Group Notes (Signed)
[facility] LCSW Group Therapy  09/23/2014 , 3:52 PM   Type of Therapy:  Group Therapy  Participation Level:  Active  Participation Quality:  Attentive  Affect:  Appropriate  Cognitive:  Alert  Insight:  Improving  Engagement in Therapy:  Engaged  Modes of Intervention:  Discussion, Exploration and Socialization  Summary of Progress/Problems: Today's group focused on the term Diagnosis.  Participants were asked to define the term, and then pronounce whether it is a negative, positive or neutral term.  Sat quietly wrapped in blanket.  Was reluctant to come in the first place.  Stated that he thinks of nurses when he hears the word diagnosis "because they take care of people."  Daryel Geraldorth, Armanii Pressnell B 09/23/2014 , 3:52 PM

## 2014-09-24 MED ORDER — CITALOPRAM HYDROBROMIDE 20 MG PO TABS
20.0000 mg | ORAL_TABLET | Freq: Every day | ORAL | Status: DC
  Administered 2014-09-25: 20 mg via ORAL
  Filled 2014-09-24: qty 14
  Filled 2014-09-24 (×2): qty 1

## 2014-09-24 NOTE — Progress Notes (Signed)
[facility]  09/24/2014 2:33 PM Timothy [name]  MRN:  [id number] Subjective:  Patient states " I am ok, I like your dress.'   Objective; Patient seen and chart reviewed.Reviewed notes from weekend provider.  Pt presented with psychosis, depression as well as side effects to antipsychotics that were started on an outpatient basis.  Pt today found in bed , continues to improve . Pt has a brighter affect , denies any new sx. Pt has been compliant on medications . Pt denies SI/HI/AH/VH. Denies any side effects to medications. Pt denies stiffness, tremors or rigidity, drooling. Pt per staff reports his depression as improved , continues to have some anxiety sx. Celexa was added yesterday. Will increase the dose . Pt encouraged to attend groups , take medications.       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 Schizoaffective disorder, bipolar type [F25.0] 09/18/2014  . Tobacco use disorder [Z72.0] 09/18/2014  . 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 Cannabis use disorder, severe, dependence [F12.20] 09/17/2014   Total Time spent with patient: 30 minutes   Past Medical History:  Past Medical History  Diagnosis Date  . Paranoid schizophrenia   . Cannabis abuse   . Schizoaffective disorder, depressive type 09/17/2014   History reviewed. No pertinent past surgical history. Family History: History reviewed. No pertinent family history. Social History:  History  Alcohol Use No     History  Drug Use  . Yes  . Special: Marijuana    History   Social History  . Marital Status: Single    Spouse Name: N/A  . Number of Children: N/A  . Years of  Education: N/A   Social History Main Topics  . Smoking status: Never Smoker   . Smokeless tobacco: Not on file  . Alcohol Use: No  . Drug Use: Yes    Special: Marijuana  . Sexual Activity: No   Other Topics Concern  . None   Social History Narrative   Additional History:    Sleep: Fair  Appetite:  Fair     Musculoskeletal: Strength & Muscle Tone: within normal limits Gait & Station: normal Patient leans: N/A   Psychiatric Specialty Exam: Physical Exam  Psychiatric: His speech is normal and behavior is normal. Judgment and thought content normal. His mood appears anxious. Cognition and memory are normal. He exhibits a depressed mood.    Review of Systems  Psychiatric/Behavioral: Positive for depression (improving) and substance abuse. The patient is nervous/anxious (improving).     Blood pressure 110/60, pulse 106, temperature 99.5 F (37.5 C), temperature source Oral, resp. rate 18, height 6\' 3"  (1.905 m), weight 93.895 kg (207 lb).Body mass index is 25.87 kg/(m^2).  General Appearance: Casual  Eye Contact::  Fair  Speech:  Normal Rate  Volume:  Decreased  Mood:  Dysphoric  Affect:  Congruent   Thought Process:  Goal Directed  Orientation:  Full (Time, Place, and Person)  Thought Content:  Negative  Suicidal Thoughts:  No  Homicidal Thoughts:  No  Memory:  Immediate;   Fair Recent;   Fair Remote;   Fair  Judgement:  Impaired  Insight:  Fair  Psychomotor Activity:  Normal  Concentration:  Fair  Recall:  [employer]Fair  Fund of Knowledge:Fair  Language: Fair  Akathisia:  No  Handed:  Right  AIMS (if indicated):     Assets:  Physical Health Social Support  ADL's:  Intact  Cognition: WNL  Sleep:  Number of Hours: 6.5     Current Medications: Current Facility-Administered Medications  Medication Dose Route Frequency Provider Last Rate Last Dose  . acetaminophen (TYLENOL) tablet 650 mg  650 mg Oral Q6H PRN Kerry Hough, PA-C      . alum & mag  hydroxide-simeth (MAALOX/MYLANTA) 200-200-20 MG/5ML suspension 30 mL  30 mL Oral Q4H PRN Kerry Hough, PA-C      . benztropine (COGENTIN) tablet 0.5 mg  0.5 mg Oral BID Kerry Hough, PA-C   0.5 mg at 09/24/14 0808  . [START ON 09/25/2014] citalopram (CELEXA) tablet 20 mg  20 mg Oral Daily Aileena Iglesia, MD      . divalproex (DEPAKOTE) DR tablet 500 mg  500 mg Oral Q12H Gwynne Kemnitz, MD   500 mg at 09/24/14 0753  . hydrocerin (EUCERIN) cream   Topical TID Jomarie Longs, MD      . hydrOXYzine (ATARAX/VISTARIL) tablet 25 mg  25 mg Oral Q6H PRN Kerry Hough, PA-C      . LORazepam (ATIVAN) tablet 1 mg  1 mg Oral Q6H PRN Jomarie Longs, MD       Or  . LORazepam (ATIVAN) injection 1 mg  1 mg Intramuscular Q6H PRN Hartford Maulden, MD      . magnesium hydroxide (MILK OF MAGNESIA) suspension 30 mL  30 mL Oral Daily PRN Kerry Hough, PA-C      . nicotine (NICODERM CQ - dosed in mg/24 hours) patch 21 mg  21 mg Transdermal Daily Jomarie Longs, MD   21 mg at 09/24/14 0752  . nitrofurantoin (macrocrystal-monohydrate) (MACROBID) capsule 100 mg  100 mg Oral Q12H Rahmir Beever, MD   100 mg at 09/24/14 0753  . QUEtiapine (SEROQUEL XR) 24 hr tablet 200 mg  200 mg Oral QHS Jomarie Longs, MD   200 mg at 09/23/14 2136  . traZODone (DESYREL) tablet 50 mg  50 mg Oral QHS,MR X 1 Kerry Hough, PA-C   50 mg at 09/22/14 2126    Lab Results:  No results found for this or any previous visit (from the past 48 hour(s)).  Physical Findings: AIMS: Facial and Oral Movements Muscles of Facial Expression: None, normal Lips and Perioral Area: None, normal Jaw: None, normal Tongue: None, normal,Extremity Movements Upper (arms, wrists, hands, fingers): None, normal Lower (legs, knees, ankles, toes): None, normal, Trunk Movements Neck, shoulders, hips: None, normal, Overall Severity Severity of abnormal movements (highest score from questions above): None, normal Incapacitation due to abnormal movements:  None, normal Patient's awareness of abnormal movements (rate only patient's report): No Awareness, Dental Status Current problems with teeth and/or dentures?: No Does patient usually wear dentures?: No  CIWA:  CIWA-Ar Total: 1 COWS:  COWS Total Score: 1   Assessment: Patient is a 40 y old AAM with hx of schizoaffective disorder , presented with negative sx as well as side effects to antipsychotics . Pt per staff continues to improve, depression has improved , continues to have some anxiety sx.  Treatment Plan Summary: Daily contact with patient to assess and evaluate symptoms and progress in treatment and Medication management Seroquel XR  200 mg po qhs for psychosis, it has a low EPS profile and is better suited for this patient. Will monitor his tremors and continue Cogentin. Will increase  Celexa to 20 mg po daily. Will continue Depakote 500 mg po bid for mood lability.Depakote level on 09/22/14= 80 Ativan 1 mg po prn/IM prn for anxiety agitation. CSW will work on  disposition.   Medical Decision Making:  Review of Psycho-Social Stressors (1), Review of Last Therapy Session (1), Review of Medication Regimen & Side Effects (2) and Review of New Medication or Change in Dosage (2)     Cortni Tays MD 09/24/2014, 2:33 PM

## 2014-09-24 NOTE — Tx Team (Signed)
  Interdisciplinary Treatment Plan Update   Date Reviewed:  09/24/2014  Time Reviewed:  12:55 PM  Progress in Treatment:   Attending groups: Yes Participating in groups: Yes Taking medication as prescribed: Yes  Tolerating medication: Yes Family/Significant other contact made: Yes  Patient understands diagnosis: Yes  Discussing patient identified problems/goals with staff: Yes  See initial care plan Medical problems stabilized or resolved: Yes Denies suicidal/homicidal ideation: Yes  In tx team Patient has not harmed self or others: Yes  For review of initial/current patient goals, please see plan of care.  Estimated Length of Stay:  Likely d/c tomorrow  Reason for Continuation of Hospitalization:   New Problems/Goals identified:  N/A  Discharge Plan or Barriers:   return home, follow up outpt  Additional Comments:  Attendees:  Signature: Ivin BootySarama Eappen, MD 09/24/2014 12:55 PM   Signature: Richelle Itood Annabell Oconnor, LCSW 09/24/2014 12:55 PM  Signature: Fransisca KaufmannLaura Davis, NP 09/24/2014 12:55 PM  Signature: Kathi SimpersSarah Twyman, RN 09/24/2014 12:55 PM  Signature:  09/24/2014 12:55 PM  Signature:  09/24/2014 12:55 PM  Signature:   09/24/2014 12:55 PM  Signature:    Signature:    Signature:    Signature:    Signature:    Signature:      Scribe for Treatment Team:   Richelle Itood Brent Noto, LCSW  09/24/2014 12:55 PM

## 2014-09-24 NOTE — Plan of Care (Signed)
Problem: Diagnosis: Increased Risk For Suicide Attempt Goal: STG-Patient Will Comply With Medication Regime Outcome: Completed/Met Date Met:  09/24/14 Pt adheres to scheduled medication plan

## 2014-09-24 NOTE — Progress Notes (Signed)
  [facility] :  Will you be returning to the same living situation after discharge:  Yes,  home At discharge, do you have transportation home?: Yes,  grandmother Do you have the ability to pay for your medications: Yes,  mental health  Release of information consent forms completed and in the chart;  Patient's signature needed at discharge.  Patient to Follow up at: Follow-up Information    Follow up with [facility] On 10/23/2014.   Why:  Wednesday at 3:00 with Dr Lester KinsmanAlemu.  If you need to be seen sooner than that, please [facility] between 8 and 11 AM.   Contact information:   [address]  [place] [336] [phone]      Patient denies SI/HI: Yes,  yes    Safety Planning and Suicide Prevention discussed: Yes,  yes  Have you used any form of tobacco in the last 30 days? (Cigarettes, Smokeless Tobacco, Cigars, and/or Pipes): Yes  Has patient been referred to the Quitline?: Faxed on 09/25/14.  Daryel Geraldorth, Lela Murfin B 09/24/2014, 1:22 PM

## 2014-09-24 NOTE — Progress Notes (Signed)
Adult Psychoeducational Group Note  Date:  09/24/2014 Time:  9:26 PM  Group Topic/Focus:  Wrap-Up Group:   The focus of this group is to help patients review their daily goal of treatment and discuss progress on daily workbooks.  Participation Level:  Minimal  Participation Quality:  Drowsy  Affect:  Flat  Cognitive:  Lacking  Insight: Limited  Engagement in Group:  Limited  Modes of Intervention:  Socialization and Support  Additional Comments:  Patient attended and participated in group tonight. He reports that his day has been going good. He went for meals, took his medications and went for his groups. His discharge plan is schedule for tomorrow. He will be going to stay with his grand-mother until he can get himself together. He advised that his support system is his mother.  Lita MainsFrancis, [facility] 09/24/2014, 9:26 PM

## 2014-09-24 NOTE — Progress Notes (Signed)
Patient ID: Timothy [name], male   DOB: 09/30/1995, 19 y.o.   MRN: [id number]   Pt currently presents with an appropriate affect and depressed behavior. Interaction with pt was minimal and pt forwards little. Pt complaining of being "tired" and has remained in bed for most of the morning. Per self inventory, pt rates depression at a 5, hopelessness 3 and anxiety 0. Pt's daily goal is "I need work on getting out so I can go home, take my medicine and be at all my groups" and they intend to do so by "attend my groups sessions, take my medicine." Pt reports fair sleep, good concentration and a good appetite. Pt also reports normal energy.  Pt provided with medications per providers orders. Pt's labs and vitals were monitored throughout the day. Pt supported emotionally and encouraged to express concerns and questions. Pt educated on medications and depression.  Pt's safety ensured with 15 minute and environmental checks. Pt currently denies SI/HI and A/V hallucinations. Pt verbally agrees to seek staff if SI/HI or A/VH occurs and to consult with staff before acting on these thoughts. Pt writes, "I'm just a little home sick, missing my family" and "I thank you all for helping me get better, recover from my situation."

## 2014-09-24 NOTE — Progress Notes (Signed)
Patient ID: Timothy [name], male   DOB: 06/02/1996, 19 y.o.   MRN: [id number] Adult Psychoeducational Group Note  Date:  09/24/2014 Time: 0900  Group Topic/Focus:  Orientation:   The focus of this group is to educate the patient on the purpose and policies of crisis stabilization and provide a format to answer questions about their admission.  The group details unit policies and expectations of patients while admitted.  Participation Level:  Did Not Attend  Participation Quality:  n/a  Affect:  n/a  Cognitive: n/a  Insight: n/a  Engagement in Group: n/a  Modes of Intervention:  Education, Orientation and Support  Additional Comments:  Pt did not attend group. Pt in bed asleep.   Aurora Maskwyman, Camil Wilhelmsen E 09/24/2014,11:21 AM

## 2014-09-24 NOTE — [facility] Group Notes (Signed)
[facility] LCSW Group Therapy  09/24/2014 1:15 pm  Type of Therapy: Process Group Therapy  Participation Level:  Active  Participation Quality:  Appropriate  Affect:  Flat  Cognitive:  Oriented  Insight:  Improving  Engagement in Group:  Limited  Engagement in Therapy:  Limited  Modes of Intervention:  Activity, Clarification, Education, Problem-solving and Support  Summary of Progress/Problems: Today's group addressed the issue of overcoming obstacles.  Patients were asked to identify their biggest obstacle post d/c that stands in the way of their on-going success, and then problem solve as to how to manage this.  [name] was unable to identify any obstacles he has overcome, nor any going forward.  He stated that he thinks smoking weed is fine because it "mellows me out" and he denies it causes paranoia.  Says his grandmother is OK with it as well.  Limited insight.  Poor judgment.  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  Ida Rogueorth, Casee Knepp B 09/24/2014   12:50 PM

## 2014-09-24 NOTE — [facility] Suicide Risk Assessment (Signed)
[facility] INPATIENT:  Family/Significant Other Suicide Prevention Education  Suicide Prevention Education:  Education Completed; No one has been identified by the patient as the family member/significant other with whom the patient will be residing, and identified as the person(s) who will aid the patient in the event of a mental health crisis (suicidal ideations/suicide attempt).  With written consent from the patient, the family member/significant other has been provided the following suicide prevention education, prior to the and/or following the discharge of the patient.  The suicide prevention education provided includes the following:  Suicide risk factors  Suicide prevention and interventions  National Suicide Hotline telephone number  [facility] assessment telephone number  [facility] Assistance 911  [facility] and/or [facility]idential Mobile Crisis [facility] telephone number  Request made of family/significant other to:  Remove weapons (e.g., guns, rifles, knives), all items previously/currently identified as safety concern.    Remove drugs/medications (over-the-counter, prescriptions, illicit drugs), all items previously/currently identified as a safety concern.  The family member/significant other verbalizes understanding of the suicide prevention education information provided.  The family member/significant other agrees to remove the items of safety concern listed above.  The patient did not endorse SI at the time of admission, nor did the patient c/o SI during the stay here.  SPE not required.  However, i did talk to grandmother Dellia Nimsatricia Ramsey, [phone], and talked to her about his weed smoking [which she is already aware of] and what to do in case of emergency.   Daryel Geraldorth, Deandrae Wajda B 09/24/2014, 1:17 PM

## 2014-09-25 MED ORDER — DIVALPROEX SODIUM 500 MG PO DR TAB: 500.0000 mg | DELAYED_RELEASE_TABLET | Freq: Two times a day (BID) | ORAL | Status: DC

## 2014-09-25 MED ORDER — NITROFURANTOIN MONOHYD MACRO 100 MG PO CAPS: 100.0000 mg | ORAL_CAPSULE | Freq: Two times a day (BID) | ORAL | Status: DC

## 2014-09-25 MED ORDER — QUETIAPINE FUMARATE ER 200 MG PO TB24: 200.0000 mg | ORAL_TABLET | Freq: Every day | ORAL | Status: DC

## 2014-09-25 MED ORDER — CITALOPRAM HYDROBROMIDE 20 MG PO TABS: 20.0000 mg | ORAL_TABLET | Freq: Every day | ORAL | Status: DC

## 2014-09-25 MED ORDER — HYDROXYZINE HCL 25 MG PO TABS: 25.0000 mg | ORAL_TABLET | Freq: Four times a day (QID) | ORAL | Status: DC | PRN

## 2014-09-25 MED ORDER — HYDROCERIN EX CREA: 1.0000 "application " | TOPICAL_CREAM | Freq: Three times a day (TID) | CUTANEOUS | Status: DC

## 2014-09-25 MED ORDER — BENZTROPINE MESYLATE 0.5 MG PO TABS: 0.5000 mg | ORAL_TABLET | Freq: Two times a day (BID) | ORAL | Status: DC

## 2014-09-25 MED ORDER — TRAZODONE HCL 50 MG PO TABS: 50.0000 mg | ORAL_TABLET | Freq: Every evening | ORAL | Status: DC | PRN

## 2014-09-25 NOTE — [facility] Suicide Risk Assessment (Signed)
[facility]   Demographic Factors:  Male and Unemployed  Total Time spent with patient: 20 minutes  Musculoskeletal: Strength & Muscle Tone: within normal limits Gait & Station: normal Patient leans: N/A  Psychiatric Specialty Exam: Physical Exam  Review of Systems  Psychiatric/Behavioral: Positive for substance abuse (stable). Negative for depression, suicidal ideas and hallucinations. The patient is not nervous/anxious and does not have insomnia.     Blood pressure 115/64, pulse 94, temperature 98.6 F (37 C), temperature source Oral, resp. rate 20, height 6\' 3"  (1.905 m), weight 93.895 kg (207 lb).Body mass index is 25.87 kg/(m^2).  General Appearance: Casual  Eye Contact::  Fair  Speech:  Normal Rate409  Volume:  Normal  Mood:  Euthymic  Affect:  Appropriate  Thought Process:  Coherent  Orientation:  Full (Time, Place, and Person)  Thought Content:  WDL  Suicidal Thoughts:  No  Homicidal Thoughts:  No  Memory:  Immediate;   Fair Recent;   Fair Remote;   Fair  Judgement:  Fair  Insight:  Fair  Psychomotor Activity:  Normal  Concentration:  Fair  Recall:  [employer]Fair  Fund of Knowledge:Fair  Language: Fair  Akathisia:  No  Handed:  Right  AIMS (if indicated):     Assets:  Communication Skills Desire for Improvement  Sleep:  Number of Hours: 6.5  Cognition: WNL  ADL's:  Intact   Have you used any form of tobacco in the last 30 days? (Cigarettes, Smokeless Tobacco, Cigars, and/or Pipes): Yes  Has this patient used any form of tobacco in the last 30 days? (Cigarettes, Smokeless Tobacco, Cigars, and/or Pipes) Yes, Prescription given for nicotine patch.  Mental Status Per Nursing Assessment::   On Admission:  NA  Current Mental Status by Physician: pt denies SI/HI/AH/VH  Loss Factors: Decrease in vocational status and Financial problems/change in socioeconomic status  Historical Factors: Impulsivity  Risk Reduction Factors:   Living  with another person, especially a relative and Positive social support  Continued Clinical Symptoms:  Alcohol/Substance Abuse/Dependencies Previous Psychiatric Diagnoses and Treatments  Cognitive Features That Contribute To Risk:  Polarized thinking    Suicide Risk:  Minimal: No identifiable suicidal ideation.  Patients presenting with no risk factors but with morbid ruminations; may be classified as minimal risk based on the severity of the depressive symptoms  Principal Problem: Schizoaffective disorder, bipolar type Discharge Diagnoses:  Patient Active Problem List   Diagnosis Date Noted  . Schizoaffective disorder, bipolar type [F25.0] 09/18/2014  . Tobacco use disorder [Z72.0] 09/18/2014  . 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 Cannabis use disorder, severe, dependence [F12.20] 09/17/2014    Follow-up Information    Follow up with [facility] On 10/23/2014.   Why:  Wednesday at 3:00 with Dr Lester KinsmanAlemu.  If you need to be seen sooner than that, please [facility] between 8 and 11 AM.   Contact information:   [address]  [place] [336] [phone]      Plan Of Care/Follow-up recommendations:  Activity:  No restrictions Diet:  regular Tests:  as needed Other:  follow up with after care  Is patient on multiple antipsychotic therapies at discharge:  No   Has Patient had three or more failed trials of antipsychotic monotherapy by history:  No  Recommended Plan for Multiple Antipsychotic Therapies: NA    Briawna Carver MD 09/25/2014, 9:36 AM
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
## 2014-09-25 NOTE — Progress Notes (Signed)
Pt alert and oriented. Pt calm and cooperative this morning. Pt took all scheduled medications. Pt reported that he slept well during the night. Pt denied any pain. Pt reports being ready for discharge. Pt denies any SI/HI/AH/VH. Pt received discharge instructions and sample medications. Pt was able to eat lunch and was hopeful for the future. Pt says he plans to get back on his feet. Pt discharged right after lunch time.

## 2014-09-25 NOTE — Progress Notes (Signed)
Patient ID: Timothy [name], male   DOB: 06-Jan-1996, 19 y.o.   MRN: [id number] D: Client in dayroom most of the evening, sleeping at one point. Client reports goal: "to take my medicine, go to groups" Client reports goal met. A: Writer commended client on meeting his goal and realizing the importance of continuing his medications, encouraged him to take it a home as instructed. Staff will monitor q42mn for safety. R: client is safe on the unit, attended group.

## 2014-09-26 NOTE — Progress Notes (Signed)
Patient Discharge Instructions:  After Visit Summary (AVS):   Faxed to:  09/26/14 Discharge Summary Note:   Faxed to:  09/26/14 Psychiatric Admission Assessment Note:   Faxed to:  09/26/14 Suicide Risk Assessment - Discharge Assessment:   Faxed to:  09/26/14 Faxed/Sent to the Next Level Care provider:  09/26/14 [facility]ax[facility] to [facility] @ [phone]  Jerelene ReddenSheena E Rankin, 09/26/2014, 2:37 PM

## 2015-11-24 ENCOUNTER — Encounter (HOSPITAL_COMMUNITY): Payer: Self-pay | Admitting: Emergency Medicine

## 2015-11-24 ENCOUNTER — Emergency Department (HOSPITAL_COMMUNITY)
Admission: EM | Admit: 2015-11-24 | Discharge: 2015-11-25 | Disposition: A | Discharge status: Other Acute Inpatient Hospital | Payer: Self-pay | Attending: Emergency Medicine | Admitting: Emergency Medicine

## 2015-11-24 DIAGNOSIS — F250 Schizoaffective disorder, bipolar type: Secondary | ICD-10-CM | POA: Insufficient documentation

## 2015-11-24 DIAGNOSIS — Z79899 Other long term (current) drug therapy: Secondary | ICD-10-CM | POA: Insufficient documentation

## 2015-11-24 DIAGNOSIS — R45851 Suicidal ideations: Secondary | ICD-10-CM | POA: Insufficient documentation

## 2015-11-24 DIAGNOSIS — Z791 Long term (current) use of non-steroidal anti-inflammatories (NSAID): Secondary | ICD-10-CM | POA: Insufficient documentation

## 2015-11-24 LAB — CBC
HCT: 45.3 % (ref 39.0–52.0)
Hemoglobin: 15.3 g/dL (ref 13.0–17.0)
MCH: 29.7 pg (ref 26.0–34.0)
MCHC: 33.8 g/dL (ref 30.0–36.0)
MCV: 87.8 fL (ref 78.0–100.0)
PLATELETS: 256 10*3/uL (ref 150–400)
RBC: 5.16 MIL/uL (ref 4.22–5.81)
RDW: 13 % (ref 11.5–15.5)
WBC: 7.8 10*3/uL (ref 4.0–10.5)

## 2015-11-24 LAB — COMPREHENSIVE METABOLIC PANEL
ALT: 80 U/L — AB (ref 17–63)
AST: 44 U/L — ABNORMAL HIGH (ref 15–41)
Albumin: 4.6 g/dL (ref 3.5–5.0)
Alkaline Phosphatase: 48 U/L (ref 38–126)
Anion gap: 9 (ref 5–15)
BILIRUBIN TOTAL: 1.1 mg/dL (ref 0.3–1.2)
BUN: 13 mg/dL (ref 6–20)
CO2: 24 mmol/L (ref 22–32)
CREATININE: 1 mg/dL (ref 0.61–1.24)
Calcium: 9.5 mg/dL (ref 8.9–10.3)
Chloride: 103 mmol/L (ref 101–111)
GFR calc Af Amer: >60 mL/min (ref >60)
Glucose, Bld: 86 mg/dL (ref 65–99)
POTASSIUM: 3.6 mmol/L (ref 3.5–5.1)
Sodium: 136 mmol/L (ref 135–145)
TOTAL PROTEIN: 8.3 g/dL — AB (ref 6.5–8.1)

## 2015-11-24 LAB — SALICYLATE LEVEL: Salicylate Lvl: <4 mg/dL (ref 2.8–30.0)

## 2015-11-24 LAB — ETHANOL

## 2015-11-24 LAB — ACETAMINOPHEN LEVEL: Acetaminophen (Tylenol), Serum: <10 ug/mL — ABNORMAL LOW (ref 10–30)

## 2015-11-24 MED ORDER — TRAZODONE HCL 50 MG PO TABS
50.0000 mg | ORAL_TABLET | Freq: Every evening | ORAL | Status: DC | PRN
  Administered 2015-11-24: 50 mg via ORAL
  Filled 2015-11-24: qty 1

## 2015-11-24 MED ORDER — BENZTROPINE MESYLATE 1 MG PO TABS
0.5000 mg | ORAL_TABLET | Freq: Two times a day (BID) | ORAL | Status: DC
  Administered 2015-11-24 – 2015-11-25 (×2): 0.5 mg via ORAL
  Filled 2015-11-24 (×2): qty 1

## 2015-11-24 MED ORDER — QUETIAPINE FUMARATE ER 200 MG PO TB24
200.0000 mg | ORAL_TABLET | Freq: Every day | ORAL | Status: DC
  Administered 2015-11-24: 200 mg via ORAL
  Filled 2015-11-24 (×2): qty 1

## 2015-11-24 MED ORDER — HYDROXYZINE HCL 25 MG PO TABS: 25.0000 mg | ORAL_TABLET | Freq: Four times a day (QID) | ORAL | Status: DC | PRN

## 2015-11-24 MED ORDER — DIVALPROEX SODIUM 500 MG PO DR TAB
500.0000 mg | DELAYED_RELEASE_TABLET | Freq: Two times a day (BID) | ORAL | Status: DC
  Administered 2015-11-24 – 2015-11-25 (×2): 500 mg via ORAL
  Filled 2015-11-24 (×2): qty 1

## 2015-11-24 MED ORDER — IBUPROFEN 200 MG PO TABS: 200.0000 mg | ORAL_TABLET | Freq: Four times a day (QID) | ORAL | Status: DC | PRN

## 2015-11-24 NOTE — ED Notes (Addendum)
Per EMS patient brought from home for SI.  Patient's family called due to patient in closet with belt around neck. Patient states that he saw cousin hang himself and he can too. Patient did illegal drugs today and hasn't taken his medications today.  Patient refused to answer if he wanted to harm anyone else besides himself  To EMS.

## 2015-11-24 NOTE — ED Provider Notes (Signed)
CSN: [id number]     Arrival date & time 11/24/15  1652 History   First MD Initiated Contact with Patient 11/24/15 1706     Chief Complaint  Patient presents with  . Suicidal  . Homicidal     (Consider location/radiation/quality/duration/timing/severity/associated sxs/prior Treatment) Patient is a 20 y.o. male presenting with mental health disorder.  Mental Health Problem Presenting symptoms: hallucinations and suicidal thoughts   Presenting symptoms: no suicide attempt   Patient accompanied by:  Family member (grandma) Degree of incapacity (severity):  Severe Onset quality:  Gradual Timing:  Unable to specify Progression:  Worsening Chronicity:  Recurrent Context: drug abuse   Compliance with total regimen: unclear, pt initially reports yes then reports no to pharmacfy. Associated symptoms: no abdominal pain, no anxiety, no chest pain, no headaches, no insomnia and no school problems     Past Medical History  Diagnosis Date  . Paranoid schizophrenia (HCC)   . Cannabis abuse   . Schizoaffective disorder, depressive type (HCC) 09/17/2014   History reviewed. No pertinent past surgical history. No family history on file. Social History  Substance Use Topics  . Smoking status: Never Smoker   . Smokeless tobacco: None  . Alcohol Use: No    Review of Systems  Constitutional: Negative for fever.  HENT: Negative for sore throat.   Eyes: Negative for visual disturbance.  Respiratory: Negative for shortness of breath.   Cardiovascular: Negative for chest pain.  Gastrointestinal: Negative for abdominal pain.  Genitourinary: Negative for difficulty urinating.  Musculoskeletal: Negative for back pain and neck stiffness.  Skin: Negative for rash.  Neurological: Negative for syncope and headaches.  Psychiatric/Behavioral: Positive for suicidal ideas and hallucinations. The patient is not nervous/anxious and does not have insomnia.       Allergies  Haldol and  Penicillins  Home Medications   Prior to Admission medications   Medication Sig Start Date End Date Taking? Authorizing Provider  ibuprofen (ADVIL,MOTRIN) 200 MG tablet Take 200 mg by mouth every 6 (six) hours as needed for headache or moderate pain.   Yes Historical Provider, MD  benztropine (COGENTIN) 0.5 MG tablet Take 1 tablet (0.5 mg total) by mouth 2 (two) times daily. For drug induced extrapyramidal reaction Patient not taking: Reported on 11/24/2015 09/25/14   Shuvon B Rankin, NP  citalopram (CELEXA) 20 MG tablet Take 1 tablet (20 mg total) by mouth daily. For depression Patient not taking: Reported on 11/24/2015 09/25/14   Shuvon B Rankin, NP  divalproex (DEPAKOTE) 500 MG DR tablet Take 1 tablet (500 mg total) by mouth every 12 (twelve) hours. For mood stabilization Patient not taking: Reported on 11/24/2015 09/25/14   Shuvon B Rankin, NP  hydrocerin (EUCERIN) CREA Apply 1 application topically 3 (three) times daily. Dry skin Patient not taking: Reported on 11/24/2015 09/25/14   Shuvon B Rankin, NP  hydrOXYzine (ATARAX/VISTARIL) 25 MG tablet Take 1 tablet (25 mg total) by mouth every 6 (six) hours as needed for anxiety. Patient not taking: Reported on 11/24/2015 09/25/14   Shuvon B Rankin, NP  nitrofurantoin, macrocrystal-monohydrate, (MACROBID) 100 MG capsule Take 1 capsule (100 mg total) by mouth every 12 (twelve) hours. For urinary tract infection Patient not taking: Reported on 11/24/2015 09/25/14   Shuvon B Rankin, NP  QUEtiapine (SEROQUEL XR) 200 MG 24 hr tablet Take 1 tablet (200 mg total) by mouth at bedtime. For mood stabilization/depression Patient not taking: Reported on 11/24/2015 09/25/14   Shuvon B Rankin, NP  traZODone (DESYREL) 50 MG tablet Take 1  tablet (50 mg total) by mouth at bedtime and may repeat dose one time if needed. For sleep Patient not taking: Reported on 11/24/2015 09/25/14   Shuvon B Rankin, NP   BP 119/65 mmHg  Pulse 56  Temp(Src) 98.4 F (36.9 C) (Oral)  Resp 18   SpO2 100% Physical Exam  Constitutional: He is oriented to person, place, and time. He appears well-developed and well-nourished. No distress.  HENT:  Head: Normocephalic and atraumatic.  Eyes: Conjunctivae and EOM are normal.  Neck: Normal range of motion.  Cardiovascular: Normal rate, regular rhythm, normal heart sounds and intact distal pulses.  Exam reveals no gallop and no friction rub.   No murmur heard. Pulmonary/Chest: Effort normal and breath sounds normal. No respiratory distress. He has no wheezes. He has no rales.  Abdominal: Soft. He exhibits no distension. There is no tenderness. There is no guarding.  Musculoskeletal: He exhibits no edema.  Neurological: He is alert and oriented to person, place, and time.  Skin: Skin is warm and dry. He is not diaphoretic.  Psychiatric: His affect is blunt and inappropriate (smiling at times when discussing suicidal ideation). His speech is delayed. He expresses homicidal and suicidal ideation. He expresses suicidal plans. He expresses no homicidal plans.  Nursing note and vitals reviewed.   ED Course  Procedures (including critical care time) Labs Review Labs Reviewed  COMPREHENSIVE METABOLIC PANEL - Abnormal; Notable for the following:    Total Protein 8.3 (*)    AST 44 (*)    ALT 80 (*)    All other components within normal limits  ACETAMINOPHEN LEVEL - Abnormal; Notable for the following:    Acetaminophen (Tylenol), Serum <10 (*)    All other components within normal limits  ETHANOL  SALICYLATE LEVEL  CBC  URINE RAPID DRUG SCREEN, HOSP PERFORMED    Imaging Review No results found. I have personally reviewed and evaluated these images and lab results as part of my medical decision-making.   EKG Interpretation None      MDM   Final diagnoses:  Schizoaffective disorder, bipolar type [facility])  Suicidal ideation   20 year old male with a history of panic schizophrenia, I can't cannabis abuse, schizoaffective disorder  presents with concern for suicidal and homicidal ideation. Patient with inappropriate affect on exam, does report he is having suicidal thoughts, and a plan of hanging himself. No other medical concerns at this time. Patient had reported to me that he had been taking of medications, however reported to pharmacy that he had not been taking them, and at this time we'll hold off on ordering other psychiatric medications until he is evaluated by psychiatry.  TTS consult placed.  Patient voluntary at this time, however given history would consider IVC if he changes his mind.  Pt candidate for inpt treatment. Awaiting placement.  A few home medications ordered although pt had earlier reported he does not take these.    Alvira Monday, MD 11/25/15 [phone]
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
## 2015-11-24 NOTE — BH Assessment (Signed)
Assessment completed. Consulted Donell SievertSpencer Simon, PA-C who recommended inpatient treatment. Informed Dr. Dalene SeltzerSchlossman of the recommendation. TTS to seek placement.

## 2015-11-24 NOTE — ED Notes (Signed)
Pt received guarded and giving short answers. Pt endorses having visual hallucinations. Pt denies pain, depression, and SI at this time but is not very vocal with his answers to assessment questions. Pt informed about cameras and 15 min rounds. Pt contracted for safety.

## 2015-11-24 NOTE — BH Assessment (Deleted)
Assessment completed. Consulted Spencer Simon, PA-C who recommended inpatient treatment. TTS to seek placement. Informed Dr. Pickering of the recommendation.  

## 2015-11-25 ENCOUNTER — Inpatient Hospital Stay (HOSPITAL_COMMUNITY)
Admission: AD | Admit: 2015-11-25 | Discharge: 2015-12-02 | DRG: 885 | Disposition: A | Discharge status: Home / Self Care | Payer: No Typology Code available for payment source | Source: Intra-hospital | Attending: Psychiatry | Admitting: Psychiatry

## 2015-11-25 ENCOUNTER — Encounter (HOSPITAL_COMMUNITY): Payer: Self-pay | Admitting: Psychiatry

## 2015-11-25 DIAGNOSIS — F250 Schizoaffective disorder, bipolar type: Secondary | ICD-10-CM | POA: Diagnosis not present

## 2015-11-25 DIAGNOSIS — F1220 Cannabis dependence, uncomplicated: Secondary | ICD-10-CM | POA: Diagnosis present

## 2015-11-25 DIAGNOSIS — F200 Paranoid schizophrenia: Secondary | ICD-10-CM | POA: Diagnosis present

## 2015-11-25 DIAGNOSIS — R45851 Suicidal ideations: Secondary | ICD-10-CM | POA: Diagnosis not present

## 2015-11-25 DIAGNOSIS — Z818 Family history of other mental and behavioral disorders: Secondary | ICD-10-CM

## 2015-11-25 DIAGNOSIS — F17200 Nicotine dependence, unspecified, uncomplicated: Secondary | ICD-10-CM | POA: Diagnosis not present

## 2015-11-25 LAB — RAPID URINE DRUG SCREEN, HOSP PERFORMED
Amphetamines: NOT DETECTED
Barbiturates: NOT DETECTED
Benzodiazepines: NOT DETECTED
Cocaine: NOT DETECTED
Opiates: NOT DETECTED
Tetrahydrocannabinol: POSITIVE — AB

## 2015-11-25 MED ORDER — LORAZEPAM 2 MG/ML IJ SOLN: 1.0000 mg | Freq: Four times a day (QID) | INTRAMUSCULAR | Status: DC | PRN

## 2015-11-25 MED ORDER — DIVALPROEX SODIUM 500 MG PO DR TAB
500.0000 mg | DELAYED_RELEASE_TABLET | Freq: Two times a day (BID) | ORAL | Status: DC
  Administered 2015-11-25 – 2015-11-26 (×2): 500 mg via ORAL
  Filled 2015-11-25 (×6): qty 1

## 2015-11-25 MED ORDER — ALUM & MAG HYDROXIDE-SIMETH 200-200-20 MG/5ML PO SUSP
30.0000 mL | ORAL | Status: DC | PRN
Start: 2015-11-25 — End: 2015-11-25

## 2015-11-25 MED ORDER — BENZTROPINE MESYLATE 0.5 MG PO TABS
0.5000 mg | ORAL_TABLET | Freq: Two times a day (BID) | ORAL | Status: DC
  Administered 2015-11-25 – 2015-12-02 (×14): 0.5 mg via ORAL
  Filled 2015-11-25 (×10): qty 1
  Filled 2015-11-25: qty 14
  Filled 2015-11-25 (×6): qty 1
  Filled 2015-11-25: qty 14

## 2015-11-25 MED ORDER — LORAZEPAM 1 MG PO TABS
1.0000 mg | ORAL_TABLET | Freq: Four times a day (QID) | ORAL | Status: DC | PRN
  Administered 2015-11-28: 1 mg via ORAL
  Filled 2015-11-25: qty 1

## 2015-11-25 MED ORDER — NICOTINE 21 MG/24HR TD PT24
21.0000 mg | MEDICATED_PATCH | Freq: Every day | TRANSDERMAL | Status: DC
  Administered 2015-11-25 – 2015-12-02 (×8): 21 mg via TRANSDERMAL
  Filled 2015-11-25 (×12): qty 1

## 2015-11-25 MED ORDER — PNEUMOCOCCAL VAC POLYVALENT 25 MCG/0.5ML IJ INJ
0.5000 mL | INJECTION | INTRAMUSCULAR | Status: AC
  Administered 2015-11-26: 0.5 mL via INTRAMUSCULAR

## 2015-11-25 MED ORDER — ACETAMINOPHEN 325 MG PO TABS
650.0000 mg | ORAL_TABLET | Freq: Four times a day (QID) | ORAL | Status: DC | PRN
  Administered 2015-11-27 – 2015-12-01 (×3): 650 mg via ORAL
  Filled 2015-11-25 (×3): qty 2

## 2015-11-25 MED ORDER — TRAZODONE HCL 50 MG PO TABS
50.0000 mg | ORAL_TABLET | Freq: Every evening | ORAL | Status: DC | PRN
  Administered 2015-11-25 – 2015-12-01 (×8): 50 mg via ORAL
  Filled 2015-11-25 (×3): qty 1
  Filled 2015-11-25: qty 14
  Filled 2015-11-25 (×5): qty 1
  Filled 2015-11-25: qty 14
  Filled 2015-11-25 (×8): qty 1

## 2015-11-25 MED ORDER — MAGNESIUM HYDROXIDE 400 MG/5ML PO SUSP: 30.0000 mL | Freq: Every day | ORAL | Status: DC | PRN

## 2015-11-25 MED ORDER — HYDROXYZINE HCL 25 MG PO TABS
25.0000 mg | ORAL_TABLET | Freq: Four times a day (QID) | ORAL | Status: DC | PRN
  Filled 2015-11-25: qty 10

## 2015-11-25 MED ORDER — QUETIAPINE FUMARATE ER 200 MG PO TB24
200.0000 mg | ORAL_TABLET | Freq: Every day | ORAL | Status: DC
  Administered 2015-11-25: 200 mg via ORAL
  Filled 2015-11-25 (×2): qty 1

## 2015-11-25 MED ORDER — IBUPROFEN 200 MG PO TABS
200.0000 mg | ORAL_TABLET | Freq: Four times a day (QID) | ORAL | Status: DC | PRN
  Administered 2015-11-28: 200 mg via ORAL
  Filled 2015-11-25: qty 1

## 2015-11-25 NOTE — ED Notes (Signed)
Pt has been in his bed all day with no complaints voiced. His verbal responses are very slow. He said that he sees faces of people from his past and sometimes they talk to him. He realizes that he needs help now and signed consent [facility]. [facility] and [facility] signed for same. [facility].

## 2015-11-25 NOTE — Progress Notes (Signed)
Timothy GrandchildMarkie is a 20 y.o. male being [facility]  He came into ED after suicide attempt.  He was found by family member in a closet with a belt around his neck. He reports that he hears noises and sees "faces" everywhere.  [facility] but cannot Chief Executive Officertell writer what medications that he takes.  He has had several suicide attempts in the past and denies suicidal thoughts right now.  He stated that he would contract for safety on the unit.  He denies homicidal ideation or any medical problems.  He has had multiple psychiatric hospitalizations but is unable to tell Clinical research associatewriter where. He admits to smoking marijuana daily but unable to tell how much.  He has a diagnosis of Schizoaffective disorder, depressive type.  Admission paperwork completed and signed.  Belongings searched and secured in locker # 45 see belongings sheet in paper chart.  Skin assessment completed and noted multiple tattoos and no other skin issues.  Q 15 minute checks initiated for safety.  We will monitor the progress towards his goals.
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
## 2015-11-25 NOTE — Progress Notes (Signed)
Adult Psychoeducational Group Note  Date:  11/25/2015 Time:  8:15 PM  Group Topic/Focus:  Wrap-Up Group:   The focus of this group is to help patients review their daily goal of treatment and discuss progress on daily workbooks.  Participation Level:  Minimal  Participation Quality:  Appropriate and Attentive  Affect:  Appropriate  Cognitive:  Appropriate  Insight: Appropriate  Engagement in Group:  Poor  Modes of Intervention:  Discussion  Additional Comments:  Pt was attentive but answered all of his questions nonverbally. Pt shrugged when asked to rate his day and shook his head "no" when asked if he had a goal for the day.   Cleotilde NeerJasmine S Teondre Jarosz 11/25/2015, 8:43 PM

## 2015-11-25 NOTE — Tx Team (Signed)
Initial Interdisciplinary Treatment Plan   PATIENT STRESSORS: Medication change or noncompliance Substance abuse   PATIENT STRENGTHS: Physical Health Supportive family/friends   PROBLEM LIST: Problem List/Patient Goals Date to be addressed Date deferred Reason deferred Estimated date of resolution  Psychosis 11/25/15     Depression 11/25/15     Suicide attempt 11/25/15     "Get better" 11/25/15     "Strive for better things" 11/25/15                              DISCHARGE CRITERIA:  Improved stabilization in mood, thinking, and/or behavior Verbal commitment to aftercare and medication compliance  PRELIMINARY DISCHARGE PLAN: Outpatient therapy Medication management   PATIENT/FAMIILY INVOLVEMENT: This treatment plan has been presented to and reviewed with the patient, Timothy [name].  The patient and family have been given the opportunity to ask questions and make suggestions.  Norm ParcelHeather V Latise Dilley 11/25/2015, 4:32 PM

## 2015-11-25 NOTE — BH Assessment (Signed)
BHH Assessment Progress Note  Per Carolanne GrumblingGerald Taylor, MD, this pt requires psychiatric hospitalization at this time.  Berneice Heinrichina Tate, RN, [facility] Rm [id number].  Pt has signed Voluntary Admission and Consent for Treatment, as well as Consent to Release Information to Thyra BreedHarry Suggs at [facility], his outpatient provider, and a notification call has been placed.  Signed forms have been faxed to [facility].  Pt's nurse, Diane, has been notified, and agrees to send original paperwork along with pt via [facility], and to call report to [phone].  Doylene Canninghomas Jay Kempe, MA Triage Specialist [phone]
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
## 2015-11-26 ENCOUNTER — Encounter (HOSPITAL_COMMUNITY): Payer: Self-pay | Admitting: Psychiatry

## 2015-11-26 DIAGNOSIS — F1220 Cannabis dependence, uncomplicated: Secondary | ICD-10-CM

## 2015-11-26 DIAGNOSIS — R45851 Suicidal ideations: Secondary | ICD-10-CM

## 2015-11-26 DIAGNOSIS — F17200 Nicotine dependence, unspecified, uncomplicated: Secondary | ICD-10-CM

## 2015-11-26 LAB — TSH: TSH: 2.526 u[IU]/mL (ref 0.350–4.500)

## 2015-11-26 LAB — LIPID PANEL
CHOLESTEROL: 240 mg/dL — AB (ref 0–200)
HDL: 41 mg/dL (ref >40)
LDL Cholesterol: 169 mg/dL — ABNORMAL HIGH (ref 0–99)
TRIGLYCERIDES: 149 mg/dL (ref <150)
Total CHOL/HDL Ratio: 5.9 RATIO
VLDL: 30 mg/dL (ref 0–40)

## 2015-11-26 LAB — VALPROIC ACID LEVEL: VALPROIC ACID LVL: 73 ug/mL (ref 50.0–100.0)

## 2015-11-26 MED ORDER — DIVALPROEX SODIUM 250 MG PO DR TAB
500.0000 mg | DELAYED_RELEASE_TABLET | Freq: Every day | ORAL | Status: DC
  Administered 2015-11-27 – 2015-12-02 (×6): 500 mg via ORAL
  Filled 2015-11-26 (×5): qty 1
  Filled 2015-11-26: qty 35
  Filled 2015-11-26 (×2): qty 1

## 2015-11-26 MED ORDER — QUETIAPINE FUMARATE ER 400 MG PO TB24
400.0000 mg | ORAL_TABLET | Freq: Every day | ORAL | Status: DC
  Administered 2015-11-26 – 2015-11-27 (×2): 400 mg via ORAL
  Filled 2015-11-26 (×3): qty 1

## 2015-11-26 MED ORDER — QUETIAPINE FUMARATE 50 MG PO TABS: 50.0000 mg | ORAL_TABLET | Freq: Three times a day (TID) | ORAL | Status: DC | PRN

## 2015-11-26 MED ORDER — DIVALPROEX SODIUM 500 MG PO DR TAB
750.0000 mg | DELAYED_RELEASE_TABLET | Freq: Every evening | ORAL | Status: DC
  Administered 2015-11-26 – 2015-12-01 (×6): 750 mg via ORAL
  Filled 2015-11-26 (×8): qty 1

## 2015-11-26 NOTE — Progress Notes (Signed)
D-  Patient has been pacing the halls complaining of auditory hallucinations.  Patient states that he has multiple voices telling him to harm himself.  Patient was tearful when he talked with Clinical research associatewriter and stated that he was unable to ignore the voices and they were becoming increasingly intrusive.  Patient has been making use of prn medications and has reported no relief from AVH after medications.   A- Assess patient for safety, offer medications as prescribed, engage patient in 1:1 staff talks.   R-  Patient was able to contract for safety.  Continue to monitor as prescribed,

## 2015-11-26 NOTE — Progress Notes (Signed)
D. Pt was up and visible in milieu this evening, did attend evening group activity but limited interaction or participation. Pt did not offer much to staff about how he was doing or why he was here and did not give good eye contact. Pt was able to receive bedtime medications without incident and did not verbalize any complaints. A. Support and encouragement provided. R. Safety maintained, will continue to monitor.

## 2015-11-26 NOTE — Tx Team (Signed)
Interdisciplinary Treatment Plan Update (Adult)  Date:  11/26/2015   Time Reviewed:  3:45 PM   Progress in Treatment: Attending groups: Yes. Participating in groups:  Yes. Taking medication as prescribed:  Yes. Tolerating medication:  Yes. Family/Significant other contact made:  No Patient understands diagnosis:  No  Limited insight Discussing patient identified problems/goals with staff:  Yes, see initial care plan. Medical problems stabilized or resolved:  Yes. Denies suicidal/homicidal ideation: Yes. Issues/concerns per patient self-inventory:  No. Other:  New problem(s) identified:  Discharge Plan or Barriers: see below  Reason for Continuation of Hospitalization: Depression Hallucinations Medication stabilization  Comments:  Per EMS patient brought from home for SI. Patient's family called due to patient in closet with belt around neck. Patient states that he saw cousin hang himself and he can too. Patient did illegal drugs today and hasn't taken his medications today. Patient refused to answer if he wanted to harm anyone else besides himself. Pt endorses having visual hallucinations.   Will increase Seroquel to 400 mg po qhs for psychosis. Will make available seroquel 50 mg po tid prn for severe anxiety/psychosis/agitation. Will increase Depakote DR 500 mg po daily and 750 po qpm for mood sx. Depakote level 11/26/15- 73 -another level in 5 days. Will continue Trazodone 50 mg po qhs for sleep.  Estimated length of stay: 4-5 days  New goal(s):  Review of initial/current patient goals per problem list:   Review of initial/current patient goals per problem list:  1. Goal(s): Patient will participate in aftercare plan   Met: Yes   Target date: 3-5 days post admission date   As evidenced by: Patient will participate within aftercare plan AEB aftercare provider and housing plan at discharge being identified. 11/26/15:  Return home, follow up outpt   2. Goal (s):  Patient will exhibit decreased depressive symptoms and suicidal ideations.   Met: No   Target date: 3-5 days post admission date   As evidenced by: Patient will utilize self rating of depression at 3 or below and demonstrate decreased signs of depression or be deemed stable for discharge by MD 11/26/15:  Thoughts of suicide prior to admission; appears depressed today     5. Goal(s): Patient will demonstrate decreased signs of psychosis  * Met: No  * Target date: 3-5 days post admission date  * As evidenced by: Patient will demonstrate decreased frequency of AVH or return to baseline function 11/26/15:  Pt non compliant with meds, smoking cannabis daily, endorses AH,VH      Attendees: Patient:  11/26/2015 3:45 PM   Family:   11/26/2015 3:45 PM   Physician:  Ursula Alert, MD 11/26/2015 3:45 PM   Nursing:   Phillis Haggis, RN 11/26/2015 3:45 PM   CSW:    Roque Lias, LCSW   11/26/2015 3:45 PM   Other:  11/26/2015 3:45 PM   Other:   11/26/2015 3:45 PM   Other:  Lars Pinks, Nurse CM 11/26/2015 3:45 PM   Other:   11/26/2015 3:45 PM   Other:  Norberto Sorenson, Elbert  11/26/2015 3:45 PM   Other:  11/26/2015 3:45 PM   Other:  11/26/2015 3:45 PM   Other:  11/26/2015 3:45 PM   Other:  11/26/2015 3:45 PM   Other:  11/26/2015 3:45 PM   Other:   11/26/2015 3:45 PM    Scribe for Treatment Team:   Trish Mage, 11/26/2015 3:45 PM

## 2015-11-26 NOTE — [facility] Suicide Risk Assessment (Signed)
[facility]   Nursing information obtained from:  Patient Demographic factors:  Male, Unemployed Current Mental Status:  Suicidal ideation indicated by patient Loss Factors:  Financial problems / change in socioeconomic status Historical Factors:  Prior suicide attempts, Family history of mental illness or substance abuse, Impulsivity Risk Reduction Factors:  Living with another person, especially a relative  Total Time spent with patient: 30 minutes Principal Problem: Schizoaffective disorder, bipolar type (HCC) Diagnosis:   Patient Active Problem List   Diagnosis Date Noted  . Schizoaffective disorder, bipolar type (HCC) [F25.0] 09/18/2014  . Tobacco use disorder [F17.200] 09/18/2014  . 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 Cannabis use disorder, severe, dependence (HCC) [F12.20] 09/17/2014   Subjective Data: Please see H&P.   Continued Clinical Symptoms:  Alcohol Use Disorder Identification Test Final Score (AUDIT): 2 The "Alcohol Use Disorders Identification Test", Guidelines for Use in Primary Care, Second Edition.  World Science writerHealth Organization [facility](WHO). Score between 0-7:  no or low risk or alcohol related problems. Score between 8-15:  moderate risk of alcohol related problems. Score between 16-19:  high risk of alcohol related problems. Score 20 or above:  warrants further diagnostic evaluation for alcohol dependence and treatment.   CLINICAL FACTORS:   Alcohol/Substance Abuse/Dependencies Previous Psychiatric Diagnoses and Treatments   Musculoskeletal: Strength & Muscle Tone: within normal limits Gait & Station: normal Patient leans: N/A  Psychiatric Specialty Exam: Physical Exam  Review of Systems  Psychiatric/Behavioral: Positive for hallucinations and substance abuse. The patient is nervous/anxious and has insomnia.   All other systems reviewed and are negative.   Blood pressure 111/62, pulse 109, temperature 99.8  F (37.7 C), temperature source Oral, resp. rate 18, height 6\' 3"  (1.905 m), weight 117.028 kg (258 lb).Body mass index is 32.25 kg/(m^2).              Please see H&P.                                             COGNITIVE FEATURES THAT CONTRIBUTE TO RISK:  Closed-mindedness, Polarized thinking and Thought constriction (tunnel vision)    SUICIDE RISK:   Moderate:  Frequent suicidal ideation with limited intensity, and duration, some specificity in terms of plans, no associated intent, good self-control, limited dysphoria/symptomatology, some risk factors present, and identifiable protective factors, including available and accessible social support.  PLAN OF CARE: Please see H&P.   I certify that inpatient services furnished can reasonably be expected to improve the patient's condition.   Emiyah Spraggins, MD 11/26/2015, 1:40 PM

## 2015-11-26 NOTE — Progress Notes (Signed)
Adult Psychoeducational Group Note  Date:  11/26/2015 Time:  8:10 PM  Group Topic/Focus:  Wrap-Up Group:   The focus of this group is to help patients review their daily goal of treatment and discuss progress on daily workbooks.  Participation Level:  Minimal  Participation Quality:  Attentive  Affect:  Appropriate  Cognitive:  Appropriate  Insight: Appropriate  Engagement in Group:  Engaged  Modes of Intervention:  Discussion  Additional Comments:  Pt stated that his day was the same as yesterday. Pt reported that he is working towards achieving his goal of "taking it day by day to get better."  Cleotilde NeerJasmine S Shanyla Marconi 11/26/2015, 8:38 PM

## 2015-11-26 NOTE — [facility] Group Notes (Signed)
[facility]  11/26/2015 , 12:47 PM    Type of Therapy:  Mental [employer] Presentation  Participation Level:  Active  Participation Quality:  Attentive  Affect:  Blunted  Cognitive:  Oriented  Insight:  Limited  Engagement in Therapy:  Engaged  Modes of Intervention:  Discussion, Education and Socialization  Summary of Progress/Problems:  Onalee HuaDavid from [employer] came to present his recovery story and play the guitar.  Stayed the entire time.  Sat quietly throughout.  Ida Rogueorth, Kadija Cruzen B 11/26/2015 , 12:47 PM

## 2015-11-26 NOTE — Progress Notes (Signed)
Patient ID: Timothy [name], male   DOB: 10/20/1995, 20 y.o.   MRN: [id number] D: Client visible on the unit, in dayroom watching TV, no interaction noted. Client reports of his day "alright" "voices, a little bit" A: Writer provided emotional support, encourages client to report any concerns. Medications reviewed, administered as ordered. Staff will monitor q1715min for safety. R:Client is safe on the unit, attends group.

## 2015-11-27 LAB — HEMOGLOBIN A1C
Hgb A1c MFr Bld: 5.9 % — ABNORMAL HIGH (ref 4.8–5.6)
Mean Plasma Glucose: 123 mg/dL

## 2015-11-27 NOTE — B[facility] Suicide Risk Assessment (Addendum)
[facility] INPATIENT:  Family/Significant Other Suicide Prevention Education  Suicide Prevention Education:  Family/Significant Other Refusal to Support Patient after Discharge:  Suicide Prevention Education Not Provided:  Patient has identified home of family/significant other as the place the patient will be residing after discharge.  With written consent of the patient, two attempts were made to provide Suicide Prevention Education to Ms Timothy [name], grandmother, [phone].  This person indicates he/she will not be responsible for the patient after discharge.  Ms Timothy [name] states that since the last admission here a year ago, she has worked with [name] to try to help him get straightened out, but to no avail.  He does not like her rules and disregards them.  Since his last admission, [place] has been jailed, and has lived with different family members, all of whom have sent pt back to her after a week or two "because he was acting the fool."   After each incident, he has promised her that he would straighten out, but each time he has stopped taking meds and hung out with his friends smoking cannabis. She asked about a group home, and I explained with out disability that is not an option.  I asked her about the application process, and she plead ignorance.  "I know he gets letters in the mail, but I don't know what he does with them after they arrive.  And he will not talk to me about it."  Timothy [name], Timothy [name] 11/27/2015,9:31 AM

## 2015-11-27 NOTE — Progress Notes (Signed)
[facility] MD Progress Note  11/27/2015 2:38 PM Timothy [name]  MRN:  [id number] Subjective:  Pt states " I am ok.'  Objective:Timothy [name] is a 20 y.o. AA Male, single , lives with grandmother in [place] , has a hx of schizoaffective do , who presented to [facility] after a suicide attempt. Patient seen and chart reviewed.Discussed patient with treatment team.  Pt today seen in bed, is withdrawn, anxious , but states his AH is improving. Pt denies any new concerns , denies ADRs or medications. Will continue to encourage and support.       Principal Problem: Schizoaffective disorder, bipolar type (HCC) Diagnosis:   Patient Active Problem List   Diagnosis Date Noted  . Schizoaffective disorder, bipolar type (HCC) [F25.0] 09/18/2014  . Tobacco use disorder [F17.200] 09/18/2014  . 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 Cannabis use disorder, severe, dependence (HCC) [F12.20] 09/17/2014   Total Time spent with patient: 25 minutes  Past Psychiatric History: Please see H&P.   Past Medical History:  Past Medical History  Diagnosis Date  . Paranoid schizophrenia (HCC)   . Cannabis abuse   . Schizoaffective disorder, depressive type (HCC) 09/17/2014   History reviewed. No pertinent past surgical history. Family History:  Family History  Problem Relation Age of Onset  . Mental illness Brother   . Drug abuse Brother    Family Psychiatric  History: Please see H&P.  Social History:  History  Alcohol Use No     History  Drug Use  . Yes  . Special: Marijuana    Social History   Social History  . Marital Status: Single    Spouse Name: N/A  . Number of Children: N/A  . Years of Education: N/A   Social History Main Topics  . Smoking status: Never Smoker   . Smokeless tobacco: None  . Alcohol Use: No  . Drug Use: Yes    Special: Marijuana  . Sexual Activity: No   Other Topics Concern  . None   Social History Narrative   Additional Social History:     Pain Medications: denies Prescriptions: denies abuse Over the Counter: denies History of alcohol / drug use?: Yes Longest period of sobriety (when/how long): A few days/weeks Negative Consequences of Use: Personal relationships, Work / School Withdrawal Symptoms: Irritability Name of Substance 1: Alcohol  1 - Age of First Use: 15 1 - Amount (size/oz): unknown  1 - Frequency: "occasionally"  1 - Duration: ongoing  1 - Last Use / Amount: 11-22-15 Name of Substance 2: THC  2 - Age of First Use: 12  2 - Amount (size/oz): "a lot"  2 - Frequency: daily  2 - Duration: ongoing  2 - Last Use / Amount: 11-24-15                Sleep: Fair  Appetite:  Fair  Current Medications: Current Facility-Administered Medications  Medication Dose Route Frequency Provider Last Rate Last Dose  . acetaminophen (TYLENOL) tablet 650 mg  650 mg Oral Q6H PRN Earney Navy, NP      . benztropine (COGENTIN) tablet 0.5 mg  0.5 mg Oral BID Earney Navy, NP   0.5 mg at 11/27/15 0807  . divalproex (DEPAKOTE) DR tablet 500 mg  500 mg Oral Daily Jomarie Longs, MD   500 mg at 11/27/15 0808  . divalproex (DEPAKOTE) DR tablet 750 mg  750 mg Oral QPM Kiwanna Spraker, MD   750 mg at 11/26/15 1707  .  hydrOXYzine (ATARAX/VISTARIL) tablet 25 mg  25 mg Oral Q6H PRN Earney NavyJosephine C Onuoha, NP      . ibuprofen (ADVIL,MOTRIN) tablet 200 mg  200 mg Oral Q6H PRN Earney NavyJosephine C Onuoha, NP      . LORazepam (ATIVAN) tablet 1 mg  1 mg Oral Q6H PRN Jomarie LongsSaramma Tiara Maultsby, MD       Or  . LORazepam (ATIVAN) injection 1 mg  1 mg Intramuscular Q6H PRN Harmon Bommarito, MD      . magnesium hydroxide (MILK OF MAGNESIA) suspension 30 mL  30 mL Oral Daily PRN Earney NavyJosephine C Onuoha, NP      . nicotine (NICODERM CQ - dosed in mg/24 hours) patch 21 mg  21 mg Transdermal Daily Jomarie LongsSaramma Alleyah Twombly, MD   21 mg at 11/27/15 0807  . QUEtiapine (SEROQUEL XR) 24 hr tablet 400 mg  400 mg Oral QHS Jomarie LongsSaramma Khristy Kalan, MD   400 mg at 11/26/15 2115  . QUEtiapine  (SEROQUEL) tablet 50 mg  50 mg Oral TID PRN Jomarie LongsSaramma Lurae Hornbrook, MD      . traZODone (DESYREL) tablet 50 mg  50 mg Oral QHS,MR X 1 Earney NavyJosephine C Onuoha, NP   50 mg at 11/26/15 2115    Lab Results:  Results for orders placed or performed during the hospital encounter of 11/25/15 (from the past 48 hour(s))  Valproic acid level     Status: None   Collection Time: 11/26/15  6:36 AM  Result Value Ref Range   Valproic Acid Lvl 73 50.0 - 100.0 ug/mL    Comment: Performed at [facility]  TSH     Status: None   Collection Time: 11/26/15  6:36 AM  Result Value Ref Range   TSH 2.526 0.350 - 4.500 uIU/mL    Comment: Performed at [facility]  Lipid panel     Status: Abnormal   Collection Time: 11/26/15  6:36 AM  Result Value Ref Range   Cholesterol 240 (H) 0 - 200 mg/dL   Triglycerides 191149 <478<150 mg/dL   HDL 41 >29>40 mg/dL   Total CHOL/HDL Ratio 5.9 RATIO   VLDL 30 0 - 40 mg/dL   LDL Cholesterol 562169 (H) 0 - 99 mg/dL    Comment:        Total Cholesterol/HDL:CHD Risk Coronary Heart Disease Risk Table                     Men   Women  1/2 Average Risk   3.4   3.3  Average Risk       5.0   4.4  2 X Average Risk   9.6   7.1  3 X Average Risk  23.4   11.0        Use the calculated Patient Ratio above and the CHD Risk Table to determine the patient's CHD Risk.        ATP III CLASSIFICATION (LDL):  <100     mg/dL   Optimal  [id number]  mg/dL   Near or Above                    Optimal  130-159  mg/dL   Borderline  [id number]  mg/dL   High  >295>190     mg/dL   Very High Performed at [facility]   Hemoglobin A1c     Status: Abnormal   Collection Time: 11/26/15  6:36 AM  Result Value Ref Range   Hgb A1c MFr Bld 5.9 (  H) 4.8 - 5.6 %    Comment: (NOTE)         Pre-diabetes: 5.7 - 6.4         Diabetes: >6.4         Glycemic control for adults with diabetes: <7.0    Mean Plasma Glucose 123 mg/dL    Comment: (NOTE) Performed At: [facility] [address] [place], [place] [id number] Mila Homer MD ZH:[id number] Performed at [facility]     Blood Alcohol level:  Lab Results  Component Value Date   [facility] <5 11/24/2015   ETH <5 08/11/2014    Physical Findings: AIMS: Facial and Oral Movements Muscles of Facial Expression: None, normal Lips and Perioral Area: None, normal Jaw: None, normal Tongue: None, normal,Extremity Movements Upper (arms, wrists, hands, fingers): None, normal Lower (legs, knees, ankles, toes): None, normal, Trunk Movements Neck, shoulders, hips: None, normal, Overall Severity Severity of abnormal movements (highest score from questions above): None, normal Incapacitation due to abnormal movements: None, normal Patient's awareness of abnormal movements (rate only patient's report): No Awareness, Dental Status Current problems with teeth and/or dentures?: No Does patient usually wear dentures?: No  CIWA:    COWS:     Musculoskeletal: Strength & Muscle Tone: within normal limits Gait & Station: normal Patient leans: N/A  Psychiatric Specialty Exam: Physical Exam  Nursing note and vitals reviewed.   Review of Systems  Psychiatric/Behavioral: Positive for depression, hallucinations and substance abuse.  All other systems reviewed and are negative.   Blood pressure 103/66, pulse 129, temperature 98.5 F (36.9 C), temperature source Oral, resp. rate 16, height 6\' 3"  (1.905 m), weight 117.028 kg (258 lb).Body mass index is 32.25 kg/(m^2).  General Appearance: Guarded  Eye Contact:  Minimal  Speech:  Slow  Volume:  Decreased  Mood:  Dysphoric  Affect:  Depressed  Thought Process:  Linear and Descriptions of Associations: Intact  Orientation:  Full (Time, Place, and Person)  Thought Content:  Hallucinations: Auditory and Rumination  Suicidal Thoughts:  No  Homicidal Thoughts:  No  Memory:  Immediate;   Fair Recent;   Fair Remote;   Fair  Judgement:  Impaired  Insight:  Shallow   Psychomotor Activity:  Decreased  Concentration:  Concentration: Poor and Attention Span: Fair  Recall:  [employer] of Knowledge:  Fair  Language:  Fair  Akathisia:  No  Handed:  Right  AIMS (if indicated):     Assets:  Desire for Improvement  ADL's:  Intact  Cognition:  WNL  Sleep:  Number of Hours: 6.75     Treatment Plan Summary::Audwin T Bohnet is a 20 y.o. AA Male, single , lives with grandmother in [place] , has a hx of schizoaffective do , who presented to [facility] after a suicide attempt.Pt today appears withdrawn , however reports AH as improving. Will continue treatment. Daily contact with patient to assess and evaluate symptoms and progress in treatment and Medication management  Will continue Seroquel  400 mg po qhs for psychosis. Will continue seroquel 50 mg po tid prn for severe anxiety/psychosis/agitation. Increased Depakote DR to  500 mg po daily and 750 po qpm for mood sx. Depakote level 11/26/15- 73 -another level on 11/30/15. Will continue Trazodone 50 mg po qhs for sleep.  Will continue to monitor vitals ,medication compliance and treatment side effects while patient is here.  Will monitor for medical issues as well as call consult as needed.  Reviewed labs cbc - wnl,  cmp - AST/ALT elevated - likely due to substance abuse, lipid panel - slightly abnormal ekg- qtc wnl, hba1c- 5.9 CSW will continue working on disposition. Collateral information obtained by CSW from patient's grandmother reviewed - see CSW notes in EHR. Patient to participate in therapeutic milieu .   Jeydan Barner, MD 11/27/2015, 2:38 PM

## 2015-11-27 NOTE — Progress Notes (Signed)
D-  Patient has been in his room for the majority of the shift laying on the bed.  Patient has not attended groups this shift.  Patient reported VH this shift but stated that the voices were decreasing and non intrusive at this time.  Patient denies SI and HI.  Patient's affect is blunted and patient minimally engages Clinical research associatewriter.   A- Assess patient for safety, offer medications as prescribed, engage patient in 1:1 staff talks.   R-  Patient is able to contract for safety.

## 2015-11-27 NOTE — [facility] Group Notes (Signed)
[facility] Group Notes:  (Counselor/Nursing/MHT/Case Management/Adjunct)  11/27/2015 1:15PM  Type of Therapy:  Group Therapy  Participation Level:  Active  Participation Quality:  Appropriate  Affect:  Flat  Cognitive:  Oriented  Insight:  Improving  Engagement in Group:  Limited  Engagement in Therapy:  Limited  Modes of Intervention:  Discussion, Exploration and Socialization  Summary of Progress/Problems: The topic for group was balance in life.  Pt participated in the discussion about when their life was in balance and out of balance and how this feels.  Pt discussed ways to get back in balance and short term goals they can work on to get where they want to be.  Invited.  Chose to not attend.  Daryel Geraldorth, Yukari Flax B 11/27/2015 3:12 PM

## 2015-11-27 NOTE — Progress Notes (Signed)
Pt did not attend karaoke group this evening.  

## 2015-11-28 MED ORDER — QUETIAPINE FUMARATE ER 300 MG PO TB24
500.0000 mg | ORAL_TABLET | Freq: Every day | ORAL | Status: DC
  Administered 2015-11-28 – 2015-12-01 (×4): 500 mg via ORAL
  Filled 2015-11-28 (×6): qty 1

## 2015-11-28 NOTE — Progress Notes (Signed)
[facility] Group Notes:  (Nursing/MHT/Case Management/Adjunct)  Date:  11/28/2015  Time:  9:03 PM  Type of Therapy:  Psychoeducational Skills  Participation Level:  Active  Participation Quality:  Attentive  Affect:  Not Congruent  Cognitive:  Appropriate  Insight:  Appropriate  Engagement in Group:  Developing/Improving  Modes of Intervention:  Education  Summary of Progress/Problems: The patient verbalized in group this evening that he had a good day overall and that he attended groups today and is presently "fighting depression". He states that his depression is the same as it was when he was admitted.   Hazle CocaGOODMAN, Marionna Gonia S 11/28/2015, 9:03 PM

## 2015-11-28 NOTE — [facility] Group Notes (Signed)
[facility] LCSW Group Therapy 11/28/2015  1:15 pm  Type of Therapy: Group Therapy Participation Level: Minimal  Participation Quality: Attentive  Affect: Lethargic, depressed, flat  Cognitive: Alert and Oriented  Insight: Developing/Improving and Engaged  Engagement in Therapy: Developing/Improving and Engaged  Modes of Intervention: Clarification, Confrontation, Discussion, Education, Exploration,  Limit-setting, Orientation, Problem-solving, Rapport Building, Dance movement psychotherapisteality Testing, Socialization and Support  Summary of Progress/Problems: Pt participated minimally in conversation related to hope despite CSW encouragement.  Samuella BruinKristin Margarito Dehaas, LCSW Clinical Social Worker [facility] [phone]

## 2015-11-28 NOTE — Progress Notes (Signed)
Patient ID: Timothy [name], male   DOB: 03/19/1996, 20 y.o.   MRN: [id number] Pt sleeping since begining of shift. Respiration regular and unlabored. Pt sign of distress noted

## 2015-11-28 NOTE — [facility] Group Notes (Signed)
[facility]  11/28/2015 8:45 AM  Participation Quality: Alert, Appropriate and Oriented  Mood/Affect: Flat  Depression Rating: 0  Anxiety Rating: 0  Thoughts of Suicide: Pt denies SI/HI  Will you contract for safety? Yes  Current AVH: "My thoughts are scattered"  Plan for Discharge/Comments: Pt attended discharge planning group and actively participated in group. CSW discussed suicide prevention education with the group and encouraged them to discuss discharge planning and any relevant barriers. Pt reports feeling physically sick including lethargy and nausea.   Transportation Means: Pt reports access to transportation  Supports: No supports mentioned at this time  Chad CordialLauren Carter, LCSWA 11/28/2015 9:55 AM

## 2015-11-28 NOTE — Progress Notes (Signed)
Recreation Therapy Notes  05.26.2017 approximately 3pm. Per MD order LRT met with patient to investigate ways to enhance tx during admission. Patient reports prior to his admission he was smoking marijuana and experienced VH. Patient then reported he has experienced VH prior to this incident. Patient shared that family problems cause him to smoke, as well as having no friends. Patient can only identify his sister's boyfriend, as a friend. Patient described that people take advantage of him, however as LRT investigated this statement it became clear patient has distorted view of friendship. Patient stated that he does things out of the goodness of his heart, for example putting gas in someone's tank or paying for a meal and that he expects a free place to live in return. LRT explained that not everyone views those things as equals and that because of that fact he is going to struggle to build social relationships. Patient identified no coping skills, but was able to identify gardening as a leisure interest, patient favorite flowers are daisy's, roses and tulips.   Horticulture is recommended to be a part of patient tx during admission, DD and MD approval will be needed.    Laureen Ochs Gema Ringold, LRT/CTRS    Lane Hacker 11/28/2015 3:58 PM

## 2015-11-28 NOTE — Progress Notes (Signed)
[facility] MD Progress Note  11/28/2015 11:58 AM Timothy [name]  MRN:  [id number] Subjective:  Pt states " I am not doing well, I am suicidal. I do not have a plan yet.'   Objective:Timothy [name] is a 20 y.o. AA Male, single , lives with grandmother in [place] , has a hx of schizoaffective do , who presented to [facility] after a suicide attempt. Patient seen and chart reviewed.Discussed patient with treatment team.  Pt today seen with flat affect , states he is depressed , reports several psychosocial stressors including relational issues with family. Pt reports SI - does not express any plan yet . Pt reports AH , states they are still loud , saying negative things. Pt also reported to RN that he was seeing images that are not real. Will continue to encourage and support.       Principal Problem: Schizoaffective disorder, bipolar type (HCC) Diagnosis:   Patient Active Problem List   Diagnosis Date Noted  . Schizoaffective disorder, bipolar type (HCC) [F25.0] 09/18/2014  . Tobacco use disorder [F17.200] 09/18/2014  . 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 Cannabis use disorder, severe, dependence (HCC) [F12.20] 09/17/2014   Total Time spent with patient: 25 minutes  Past Psychiatric History: Please see H&P.   Past Medical History:  Past Medical History  Diagnosis Date  . Paranoid schizophrenia (HCC)   . Cannabis abuse   . Schizoaffective disorder, depressive type (HCC) 09/17/2014   History reviewed. No pertinent past surgical history. Family History:  Family History  Problem Relation Age of Onset  . Mental illness Brother   . Drug abuse Brother    Family Psychiatric  History: Please see H&P.  Social History:  History  Alcohol Use No     History  Drug Use  . Yes  . Special: Marijuana    Social History   Social History  . Marital Status: Single    Spouse Name: N/A  . Number of Children: N/A  . Years of Education: N/A   Social History Main Topics  .  Smoking status: Never Smoker   . Smokeless tobacco: None  . Alcohol Use: No  . Drug Use: Yes    Special: Marijuana  . Sexual Activity: No   Other Topics Concern  . None   Social History Narrative   Additional Social History:    Pain Medications: denies Prescriptions: denies abuse Over the Counter: denies History of alcohol / drug use?: Yes Longest period of sobriety (when/how long): A few days/weeks Negative Consequences of Use: Personal relationships, Work / School Withdrawal Symptoms: Irritability Name of Substance 1: Alcohol  1 - Age of First Use: 15 1 - Amount (size/oz): unknown  1 - Frequency: "occasionally"  1 - Duration: ongoing  1 - Last Use / Amount: 11-22-15 Name of Substance 2: THC  2 - Age of First Use: 12  2 - Amount (size/oz): "a lot"  2 - Frequency: daily  2 - Duration: ongoing  2 - Last Use / Amount: 11-24-15                Sleep: Poor  Appetite:  Fair  Current Medications: Current Facility-Administered Medications  Medication Dose Route Frequency Provider Last Rate Last Dose  . acetaminophen (TYLENOL) tablet 650 mg  650 mg Oral Q6H PRN Earney Navy, NP   650 mg at 11/27/15 1826  . benztropine (COGENTIN) tablet 0.5 mg  0.5 mg Oral BID Earney Navy, NP   0.5 mg at  11/28/15 0836  . divalproex (DEPAKOTE) DR tablet 500 mg  500 mg Oral Daily Timothy LongsSaramma Basil Buffin, MD   500 mg at 11/28/15 0836  . divalproex (DEPAKOTE) DR tablet 750 mg  750 mg Oral QPM Timothy Waymire, MD   750 mg at 11/27/15 1825  . hydrOXYzine (ATARAX/VISTARIL) tablet 25 mg  25 mg Oral Q6H PRN Earney NavyJosephine C Onuoha, NP      . ibuprofen (ADVIL,MOTRIN) tablet 200 mg  200 mg Oral Q6H PRN Earney NavyJosephine C Onuoha, NP      . LORazepam (ATIVAN) tablet 1 mg  1 mg Oral Q6H PRN Timothy LongsSaramma Severus Brodzinski, MD       Or  . LORazepam (ATIVAN) injection 1 mg  1 mg Intramuscular Q6H PRN Timothy Fagin, MD      . magnesium hydroxide (MILK OF MAGNESIA) suspension 30 mL  30 mL Oral Daily PRN Earney NavyJosephine C Onuoha, NP       . nicotine (NICODERM CQ - dosed in mg/24 hours) patch 21 mg  21 mg Transdermal Daily Timothy LongsSaramma Jaydan Meidinger, MD   21 mg at 11/28/15 0837  . QUEtiapine (SEROQUEL XR) 24 hr tablet 500 mg  500 mg Oral QHS Timothy Manrique, MD      . QUEtiapine (SEROQUEL) tablet 50 mg  50 mg Oral TID PRN Timothy LongsSaramma Rudene Poulsen, MD      . traZODone (DESYREL) tablet 50 mg  50 mg Oral QHS,MR X 1 Earney NavyJosephine C Onuoha, NP   50 mg at 11/27/15 2208    Lab Results:  No results found for this or any previous visit (from the past 48 hour(s)).  Blood Alcohol level:  Lab Results  Component Value Date   ETH <5 11/24/2015   ETH <5 08/11/2014    Physical Findings: AIMS: Facial and Oral Movements Muscles of Facial Expression: None, normal Lips and Perioral Area: None, normal Jaw: None, normal Tongue: None, normal,Extremity Movements Upper (arms, wrists, hands, fingers): None, normal Lower (legs, knees, ankles, toes): None, normal, Trunk Movements Neck, shoulders, hips: None, normal, Overall Severity Severity of abnormal movements (highest score from questions above): None, normal Incapacitation due to abnormal movements: None, normal Patient's awareness of abnormal movements (rate only patient's report): No Awareness, Dental Status Current problems with teeth and/or dentures?: No Does patient usually wear dentures?: No  CIWA:    COWS:     Musculoskeletal: Strength & Muscle Tone: within normal limits Gait & Station: normal Patient leans: N/A  Psychiatric Specialty Exam: Physical Exam  Nursing note and vitals reviewed.   Review of Systems  Psychiatric/Behavioral: Positive for depression, suicidal ideas, hallucinations and substance abuse. The patient is nervous/anxious and has insomnia.   All other systems reviewed and are negative.   Blood pressure 123/56, pulse 102, temperature 98.6 F (37 C), temperature source Oral, resp. rate 16, height 6\' 3"  (1.905 m), weight 117.028 kg (258 lb).Body mass index is 32.25 kg/(m^2).   General Appearance: Guarded  Eye Contact:  Minimal  Speech:  Slow  Volume:  Decreased  Mood:  Dysphoric  Affect:  Depressed  Thought Process:  Linear and Descriptions of Associations: Intact  Orientation:  Full (Time, Place, and Person)  Thought Content:  Hallucinations: Auditory Tactile and Rumination  Suicidal Thoughts:  Yes.  without intent/plan does not have a plan yet, but is very depressed.  Homicidal Thoughts:  No  Memory:  Immediate;   Fair Recent;   Fair Remote;   Fair  Judgement:  Impaired  Insight:  Shallow  Psychomotor Activity:  Decreased  Concentration:  Concentration:  Poor and Attention Span: Fair  Recall:  [employer] of Knowledge:  Fair  Language:  Fair  Akathisia:  No  Handed:  Right  AIMS (if indicated):     Assets:  Desire for Improvement  ADL's:  Intact  Cognition:  WNL  Sleep:  Number of Hours: 6.75     Treatment Plan Summary::Timothy [name] is a 20 y.o. AA Male, single , lives with grandmother in [place] , has a hx of schizoaffective do , who presented to [facility] after a suicide attempt.Pt today appears withdrawn ,depressed , states he feels suicidal . Will continue treatment. Daily contact with patient to assess and evaluate symptoms and progress in treatment and Medication management  Will increase Seroquel to 500 mg po qhs for psychosis. Will continue seroquel 50 mg po tid prn for severe anxiety/psychosis/agitation. Increased Depakote DR to  500 mg po daily and 750 po qpm for mood sx. Depakote level 11/26/15- 73 -another level on 11/30/15. Will continue Trazodone 50 mg po qhs for sleep.  Will continue to monitor vitals ,medication compliance and treatment side effects while patient is here.  Will monitor for medical issues as well as call consult as needed.  Reviewed labs cbc - wnl, cmp - AST/ALT elevated - likely due to substance abuse, lipid panel - slightly abnormal ekg- qtc wnl, hba1c- 5.9 CSW will continue working on disposition. Collateral  information obtained by CSW from patient's grandmother reviewed - see CSW notes in EHR. Patient to participate in therapeutic milieu .   Tippi Mccrae, MD 11/28/2015, 11:58 AM

## 2015-11-29 NOTE — Progress Notes (Signed)
DAR NOTE: Patient presents with depressed mood and affect.  Reports suicidal thoughts, auditory and visual hallucinations.  Contracts for safety.  Rates depression at 6, hopelessness at 5, and anxiety at 7.  Maintained on routine safety checks.  Medications given as prescribed.  Support and encouragement offered as needed.  States goal for today is "fight my depression and cope with others."  Patient remained in his room for most of this shift.

## 2015-11-29 NOTE — [facility] Group Notes (Signed)
[facility] Group Notes: (Clinical Social Work)   11/29/2015      Type of Therapy:  Group Therapy   Participation Level:  Did Not Attend despite MHT prompting   Ambrose MantleMareida Grossman-Orr, LCSW 11/29/2015, 1:25 PM

## 2015-11-29 NOTE — [facility] Group Notes (Signed)
[facility] Group Notes:  (Nursing/MHT/Case Management/Adjunct)  Date:  11/29/2015  Time:  12:54 PM  Type of Therapy:  Nurse Education  Participation Level:  Did Not Attend    Timothy [name] 11/29/2015, 12:54 PM

## 2015-11-29 NOTE — Progress Notes (Signed)
Psychoeducational Group Note  Date:  11/29/2015 Time:  2038  Group Topic/Focus:  Wrap-Up Group:   The focus of this group is to help patients review their daily goal of treatment and discuss progress on daily workbooks.  Participation Level: Did Not Attend  Participation Quality:  Not Applicable  Affect:  Not Applicable  Cognitive:  Not Applicable  Insight:  Not Applicable  Engagement in Group: Not Applicable  Additional Comments:  The patient did not attend group this evening as he remained asleep in his bed.   Hazle CocaGOODMAN, Demitra Danley S 11/29/2015, 8:38 PM

## 2015-11-29 NOTE — Progress Notes (Signed)
D: Pt endorsed visual and command auditory hallucination; states, "they are putting ideas in my head." Pt was able to contract for safety. Pt also endorsed moderate depression and anxiety; states, "feeling this way is increasing my depression and anxiety." Pt also complained of mild R. hip pain from playing basketball during the day.  A: Medications offered as prescribed.  Support, encouragement, and safe environment provided.  15-minute safety checks continue. R: Pt was med compliant.  Pt attended group. Safety checks continue.

## 2015-11-29 NOTE — Progress Notes (Signed)
[facility] MD Progress Note  11/29/2015 4:35 PM Timothy [name]  MRN:  [id number] Subjective:  Pt states " I am ok, I am just really sleepy"  Objective:Timothy [name] is awake, alert and oriented X3. Seen resting in bedroom. Patient endorse suicidal ideation. Patient states he is able to contract for safety. Patient denies homicidal ideation. Reports auditory hallucination. Denies command hallucination. Patient doesn't appear to be responding to internal stimuli.(just waking up). Per Rn notes patient has been resting in  his room for most of the day. Patient reports he is medication compliant and is tolerating well. Patient reports " I think that this medication is causing me to be tried throughout the day. State his depression 8/10. Reports good appetite and states he is resting well. Support, encouragement and reassurance was provided.   Principal Problem: Schizoaffective disorder, bipolar type (HCC) Diagnosis:   Patient Active Problem List   Diagnosis Date Noted  . Schizoaffective disorder, bipolar type (HCC) [F25.0] 09/18/2014  . Tobacco use disorder [F17.200] 09/18/2014  . 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 Cannabis use disorder, severe, dependence (HCC) [F12.20] 09/17/2014   Total Time spent with patient: 25 minutes  Past Psychiatric History: Please see H&P.   Past Medical History:  Past Medical History  Diagnosis Date  . Paranoid schizophrenia (HCC)   . Cannabis abuse   . Schizoaffective disorder, depressive type (HCC) 09/17/2014   History reviewed. No pertinent past surgical history. Family History:  Family History  Problem Relation Age of Onset  . Mental illness Brother   . Drug abuse Brother    Family Psychiatric  History: Please see H&P.  Social History:  History  Alcohol Use No     History  Drug Use  . Yes  . Special: Marijuana    Social History   Social History  . Marital Status: Single    Spouse Name: N/A  . Number of Children: N/A  .  Years of Education: N/A   Social History Main Topics  . Smoking status: Never Smoker   . Smokeless tobacco: None  . Alcohol Use: No  . Drug Use: Yes    Special: Marijuana  . Sexual Activity: No   Other Topics Concern  . None   Social History Narrative   Additional Social History:    Pain Medications: denies Prescriptions: denies abuse Over the Counter: denies History of alcohol / drug use?: Yes Longest period of sobriety (when/how long): A few days/weeks Negative Consequences of Use: Personal relationships, Work / School Withdrawal Symptoms: Irritability Name of Substance 1: Alcohol  1 - Age of First Use: 15 1 - Amount (size/oz): unknown  1 - Frequency: "occasionally"  1 - Duration: ongoing  1 - Last Use / Amount: 11-22-15 Name of Substance 2: THC  2 - Age of First Use: 12  2 - Amount (size/oz): "a lot"  2 - Frequency: daily  2 - Duration: ongoing  2 - Last Use / Amount: 11-24-15                Sleep: Fair  Appetite:  Fair  Current Medications: Current Facility-Administered Medications  Medication Dose Route Frequency Provider Last Rate Last Dose  . acetaminophen (TYLENOL) tablet 650 mg  650 mg Oral Q6H PRN Earney Navy, NP   650 mg at 11/27/15 1826  . benztropine (COGENTIN) tablet 0.5 mg  0.5 mg Oral BID Earney Navy, NP   0.5 mg at 11/29/15 0834  . divalproex (DEPAKOTE) DR tablet 500 mg  500 mg Oral Daily Jomarie LongsSaramma Eappen, MD   500 mg at 11/29/15 0834  . divalproex (DEPAKOTE) DR tablet 750 mg  750 mg Oral QPM Saramma Eappen, MD   750 mg at 11/28/15 1715  . hydrOXYzine (ATARAX/VISTARIL) tablet 25 mg  25 mg Oral Q6H PRN Earney NavyJosephine C Onuoha, NP      . ibuprofen (ADVIL,MOTRIN) tablet 200 mg  200 mg Oral Q6H PRN Earney NavyJosephine C Onuoha, NP   200 mg at 11/28/15 2133  . LORazepam (ATIVAN) tablet 1 mg  1 mg Oral Q6H PRN Jomarie LongsSaramma Eappen, MD   1 mg at 11/28/15 2323   Or  . LORazepam (ATIVAN) injection 1 mg  1 mg Intramuscular Q6H PRN Saramma Eappen, MD      .  magnesium hydroxide (MILK OF MAGNESIA) suspension 30 mL  30 mL Oral Daily PRN Earney NavyJosephine C Onuoha, NP      . nicotine (NICODERM CQ - dosed in mg/24 hours) patch 21 mg  21 mg Transdermal Daily Jomarie LongsSaramma Eappen, MD   21 mg at 11/29/15 0835  . QUEtiapine (SEROQUEL XR) 24 hr tablet 500 mg  500 mg Oral QHS Jomarie LongsSaramma Eappen, MD   500 mg at 11/28/15 2133  . QUEtiapine (SEROQUEL) tablet 50 mg  50 mg Oral TID PRN Jomarie LongsSaramma Eappen, MD      . traZODone (DESYREL) tablet 50 mg  50 mg Oral QHS,MR X 1 Earney NavyJosephine C Onuoha, NP   50 mg at 11/28/15 2323    Lab Results:  No results found for this or any previous visit (from the past 48 hour(s)).  Blood Alcohol level:  Lab Results  Component Value Date   ETH <5 11/24/2015   ETH <5 08/11/2014    Physical Findings: AIMS: Facial and Oral Movements Muscles of Facial Expression: None, normal Lips and Perioral Area: None, normal Jaw: None, normal Tongue: None, normal,Extremity Movements Upper (arms, wrists, hands, fingers): None, normal Lower (legs, knees, ankles, toes): None, normal, Trunk Movements Neck, shoulders, hips: None, normal, Overall Severity Severity of abnormal movements (highest score from questions above): None, normal Incapacitation due to abnormal movements: None, normal Patient's awareness of abnormal movements (rate only patient's report): No Awareness, Dental Status Current problems with teeth and/or dentures?: No Does patient usually wear dentures?: No  CIWA:    COWS:     Musculoskeletal: Strength & Muscle Tone: within normal limits Gait & Station: normal Patient leans: N/A  Psychiatric Specialty Exam: Physical Exam  Nursing note and vitals reviewed. Constitutional: He is oriented to person, place, and time.  Musculoskeletal: Normal range of motion.  Neurological: He is alert and oriented to person, place, and time.  Psychiatric: He has a normal mood and affect. His behavior is normal.    Review of Systems  Psychiatric/Behavioral:  Positive for depression, hallucinations and substance abuse. The patient is nervous/anxious and has insomnia.   All other systems reviewed and are negative.   Blood pressure 105/61, pulse 110, temperature 98.2 F (36.8 C), temperature source Oral, resp. rate 18, height 6\' 3"  (1.905 m), weight 117.028 kg (258 lb).Body mass index is 32.25 kg/(m^2).  General Appearance: Guarded  Eye Contact:  Minimal  Speech:  Slow  Volume:  Decreased  Mood:  Dysphoric  Affect:  Depressed  Thought Process:  Linear and Descriptions of Associations: Intact  Orientation:  Full (Time, Place, and Person)  Thought Content:  Hallucinations: Auditory and Rumination  Suicidal Thoughts:  No  Homicidal Thoughts:  No  Memory:  Immediate;   Fair Recent;  Fair Remote;   Fair  Judgement:  Impaired  Insight:  Shallow  Psychomotor Activity:  Decreased  Concentration:  Concentration: Poor and Attention Span: Fair  Recall:  [employer] of Knowledge:  Fair  Language:  Fair  Akathisia:  No  Handed:  Right  AIMS (if indicated):     Assets:  Desire for Improvement  ADL's:  Intact  Cognition:  WNL  Sleep:  Number of Hours: 5.75    I agree with current treatment plan on 11/2015, Patient seen face-to-face for psychiatric evaluation follow-up, chart reviewed. Reviewed the information documented and agree with the treatment plan.  Treatment Plan Summary: Daily contact with patient to assess and evaluate symptoms and progress in treatment and Medication management  Will continue Seroquel  400 mg po qhs for psychosis. Will continue seroquel 50 mg po tid prn for severe anxiety/psychosis/agitation. Contiune Depakote DR to  500 mg po daily and 750 po qpm for mood sx. Depakote level 11/26/15- 73 -another level on 11/30/15. Will continue Trazodone 50 mg po qhs for sleep. Will continue to monitor vitals ,medication compliance and treatment side effects while patient is here.  Will monitor for medical issues as well as call  consult as needed.  Reviewed labs cbc - wnl, cmp - AST/ALT elevated - likely due to substance abuse, lipid panel - slightly abnormal ekg- qtc wnl, hba1c- 5.9 CSW will continue working on disposition. Collateral information obtained by CSW from patient's grandmother reviewed - see CSW notes in EHR. Patient to participate in therapeutic milieu .   Oneta Rack, NP 11/29/2015, 4:35 PM Agree with NP Progress Note as above

## 2015-11-30 LAB — VALPROIC ACID LEVEL: VALPROIC ACID LVL: 96 ug/mL (ref 50.0–100.0)

## 2015-11-30 NOTE — Progress Notes (Signed)
DAR NOTE: Patient presents with flat affect and depressed mood.  Denies pain, auditory and visual hallucinations.  Rates depression at 6, hopelessness at 5, and anxiety at 7.  Describes energy level as normal and concentration as good.  Maintained on routine safety checks.  Reports withdrawal symptoms of cravings, cramping, and nausea on self inventory form.  Medications given as prescribed.  Support and encouragement offered as needed.  States goal for today is "going to groups, trusting God."  Patient remained in his room sleeping most of the shift.

## 2015-11-30 NOTE — Progress Notes (Signed)
[facility]  11/30/2015 1:32 PM Timothy [name]  MRN:  [id number] Subjective:  Pt states " I am ok  I guess."  Objective:Timothy [name] is awake, alert and oriented X3. Seen resting in bedroom. Patient continues to endorse suicidal ideation w/o plan. Patient states he is able to contract for safety. Patient denies homicidal ideation. Reports auditory hallucination. Denies command hallucination. Patient doesn't appear to be responding to internal stimuli. Per Rn notes patient has been resting in  his room for most of the day. Patient reports he is medication compliant and is tolerating well. Patient reports that he attended some of the group session today. Reports "they just listened to music so I went back to bed". State his depression 8/10. Reports good appetite and states he is resting well. Support, encouragement and reassurance was provided.   Principal Problem: Schizoaffective disorder, bipolar type (HCC) Diagnosis:   Patient Active Problem List   Diagnosis Date Noted  . Schizoaffective disorder, bipolar type (HCC) [F25.0] 09/18/2014  . Tobacco use disorder [F17.200] 09/18/2014  . 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 Cannabis use disorder, severe, dependence (HCC) [F12.20] 09/17/2014   Total Time spent with patient: 25 minutes  Past Psychiatric History: Please see H&P.   Past Medical History:  Past Medical History  Diagnosis Date  . Paranoid schizophrenia (HCC)   . Cannabis abuse   . Schizoaffective disorder, depressive type (HCC) 09/17/2014   History reviewed. No pertinent past surgical history. Family History:  Family History  Problem Relation Age of Onset  . Mental illness Brother   . Drug abuse Brother    Family Psychiatric  History: Please see H&P.  Social History:  History  Alcohol Use No     History  Drug Use  . Yes  . Special: Marijuana    Social History   Social History  . Marital Status: Single    Spouse Name: N/A  .  Number of Children: N/A  . Years of Education: N/A   Social History Main Topics  . Smoking status: Never Smoker   . Smokeless tobacco: None  . Alcohol Use: No  . Drug Use: Yes    Special: Marijuana  . Sexual Activity: No   Other Topics Concern  . None   Social History Narrative   Additional Social History:    Pain Medications: denies Prescriptions: denies abuse Over the Counter: denies History of alcohol / drug use?: Yes Longest period of sobriety (when/how long): A few days/weeks Negative Consequences of Use: Personal relationships, Work / School Withdrawal Symptoms: Irritability Name of Substance 1: Alcohol  1 - Age of First Use: 15 1 - Amount (size/oz): unknown  1 - Frequency: "occasionally"  1 - Duration: ongoing  1 - Last Use / Amount: 11-22-15 Name of Substance 2: THC  2 - Age of First Use: 12  2 - Amount (size/oz): "a lot"  2 - Frequency: daily  2 - Duration: ongoing  2 - Last Use / Amount: 11-24-15                Sleep: Fair  Appetite:  Fair  Current Medications: Current Facility-Administered Medications  Medication Dose Route Frequency Provider Last Rate Last Dose  . acetaminophen (TYLENOL) tablet 650 mg  650 mg Oral Q6H PRN Earney NavyJosephine C Onuoha, NP   650 mg at 11/30/15 0813  . benztropine (COGENTIN) tablet 0.5 mg  0.5 mg Oral BID Earney NavyJosephine C Onuoha, NP   0.5 mg at 11/30/15 0813  .  divalproex (DEPAKOTE) DR tablet 500 mg  500 mg Oral Daily Jomarie Longs, MD   500 mg at 11/30/15 0813  . divalproex (DEPAKOTE) DR tablet 750 mg  750 mg Oral QPM Saramma Eappen, MD   750 mg at 11/29/15 1709  . hydrOXYzine (ATARAX/VISTARIL) tablet 25 mg  25 mg Oral Q6H PRN Earney Navy, NP      . ibuprofen (ADVIL,MOTRIN) tablet 200 mg  200 mg Oral Q6H PRN Earney Navy, NP   200 mg at 11/28/15 2133  . LORazepam (ATIVAN) tablet 1 mg  1 mg Oral Q6H PRN Jomarie Longs, MD   1 mg at 11/28/15 2323   Or  . LORazepam (ATIVAN) injection 1 mg  1 mg Intramuscular Q6H PRN  Saramma Eappen, MD      . magnesium hydroxide (MILK OF MAGNESIA) suspension 30 mL  30 mL Oral Daily PRN Earney Navy, NP      . nicotine (NICODERM CQ - dosed in mg/24 hours) patch 21 mg  21 mg Transdermal Daily Jomarie Longs, MD   21 mg at 11/30/15 0814  . QUEtiapine (SEROQUEL XR) 24 hr tablet 500 mg  500 mg Oral QHS Jomarie Longs, MD   500 mg at 11/29/15 2154  . QUEtiapine (SEROQUEL) tablet 50 mg  50 mg Oral TID PRN Jomarie Longs, MD      . traZODone (DESYREL) tablet 50 mg  50 mg Oral QHS,MR X 1 Earney Navy, NP   50 mg at 11/29/15 2154    Lab Results:  Results for orders placed or performed during the hospital encounter of 11/25/15 (from the past 48 hour(s))  Valproic acid level     Status: None   Collection Time: 11/30/15  6:23 AM  Result Value Ref Range   Valproic Acid Lvl 96 50.0 - 100.0 ug/mL    Comment: Performed at [facility]    Blood Alcohol level:  Lab Results  Component Value Date   [facility] <5 11/24/2015   ETH <5 08/11/2014    Physical Findings: AIMS: Facial and Oral Movements Muscles of Facial Expression: None, normal Lips and Perioral Area: None, normal Jaw: None, normal Tongue: None, normal,Extremity Movements Upper (arms, wrists, hands, fingers): None, normal Lower (legs, knees, ankles, toes): None, normal, Trunk Movements Neck, shoulders, hips: None, normal, Overall Severity Severity of abnormal movements (highest score from questions above): None, normal Incapacitation due to abnormal movements: None, normal Patient's awareness of abnormal movements (rate only patient's report): No Awareness, Dental Status Current problems with teeth and/or dentures?: No Does patient usually wear dentures?: No  CIWA:    COWS:     Musculoskeletal: Strength & Muscle Tone: within normal limits Gait & Station: normal Patient leans: N/A  Psychiatric Specialty Exam: Physical Exam  Nursing note and vitals reviewed. Constitutional: He is  oriented to person, place, and time.  Musculoskeletal: Normal range of motion.  Neurological: He is alert and oriented to person, place, and time.  Psychiatric: He has a normal mood and affect. His behavior is normal.    Review of Systems  Psychiatric/Behavioral: Positive for depression, hallucinations and substance abuse. The patient is nervous/anxious and has insomnia.   All other systems reviewed and are negative.   Blood pressure 128/83, pulse 92, temperature 98 F (36.7 C), temperature source Oral, resp. rate 16, height  (1.905 m), weight 117.028 kg (258 lb).Body mass index is 32.25 kg/(m^2).  General Appearance: Guarded  Eye Contact:  Minimal  Speech:  Slow  Volume:  Decreased  Mood:  Dysphoric  Affect:  Depressed  Thought Process:  Linear and Descriptions of Associations: Intact  Orientation:  Full (Time, Place, and Person)  Thought Content:  Hallucinations: Auditory and Rumination  Suicidal Thoughts:  Yes.  without intent/plan.Patient did contract for safety while on the unit.  Homicidal Thoughts:  Yes.  without intent/plan  Memory:  Immediate;   Fair Recent;   Fair Remote;   Fair  Judgement:  Impaired  Insight:  Shallow  Psychomotor Activity:  Decreased  Concentration:  Concentration: Poor and Attention Span: Fair  Recall:  [employer] of Knowledge:  Fair  Language:  Fair  Akathisia:  No  Handed:  Right  AIMS (if indicated):     Assets:  Desire for Improvement  ADL's:  Intact  Cognition:  WNL  Sleep:  Number of Hours: 5.75    I agree with current treatment plan on 11/30/2015, Patient seen face-to-face for psychiatric evaluation follow-up, chart reviewed. Reviewed the information documented and agree with the treatment plan.  Treatment Plan Summary: Daily contact with patient to assess and evaluate symptoms and progress in treatment and Medication management  Will continue Seroquel  400 mg po qhs for psychosis. Will continue Seroquel 50 mg po tid prn for  severe anxiety/psychosis/agitation. Continue Depakote DR to  500 mg po daily and 750 po qpm for mood sx. Depakote level 11/26/15- 73 -another level on 11/30/15. Will continue Trazodone 50 mg po qhs for sleep. Will continue to monitor vitals ,medication compliance and treatment side effects while patient is here.  Will monitor for medical issues as well as call consult as needed.  Reviewed labs cbc - wnl, cmp - AST/ALT elevated - likely due to substance abuse, lipid panel - slightly abnormal ekg- qtc wnl, hba1c- 5.9 CSW will continue working on disposition. Collateral information obtained by CSW from patient's grandmother reviewed - see CSW notes in EHR. Patient to participate in therapeutic milieu .   Oneta Rack, NP 11/30/2015, 1:32 PM Agree with NP Progress Note as above

## 2015-11-30 NOTE — [facility] Group Notes (Signed)
[facility] Group Notes:  (Nursing/MHT/Case Management/Adjunct)  Date:  11/30/2015  Time:  10:37 AM  Type of Therapy:  Nurse Education  Participation Level:  Did Not Attend   Timothy [name] 11/30/2015, 10:37 AM

## 2015-11-30 NOTE — [facility] Group Notes (Signed)
[facility] Group Notes: (Clinical Social Work)   11/30/2015      Type of Therapy:  Group Therapy   Participation Level:  Did Not Attend despite MHT prompting   Timothy MantleMareida Grossman-Orr, LCSW 11/30/2015, 11:42 AM

## 2015-11-30 NOTE — Progress Notes (Signed)
D: Pt is isolative and withdrawn to room; was in bed all evening. Pt endorsed visual and command auditory hallucination; however, contract for safety. Pt also endorsed moderate depression and anxiety. Pt did not look to be in any acute distress. A: Medications offered as prescribed.  Support, encouragement, and safe environment provided.  15-minute safety checks continue. R: Pt was med compliant.  Pt did not attend group. Safety checks continue.

## 2015-12-01 DIAGNOSIS — F250 Schizoaffective disorder, bipolar type: Principal | ICD-10-CM

## 2015-12-01 NOTE — Progress Notes (Signed)
[facility] MD Progress Note  12/01/2015 2:41 PM Timothy [name]  MRN:  [id number] Subjective:  Pt states " good.  I still hear voices sometimes.  I feel mostly depression not psychotic."  Objective:Timothy [name] is awake, alert and oriented X3.   He is is bed and resting.  He reports that he is takings meds with no side effects.  Some persistent AH telling him to hurt himself.  Patient appears guarded and responding mostly yes or no with questions.  No disruptive behaviors on unit and he attending groups.    Principal Problem: Schizoaffective disorder, bipolar type (HCC) Diagnosis:   Patient Active Problem List   Diagnosis Date Noted  . Schizoaffective disorder, bipolar type (HCC) [F25.0] 09/18/2014  . Tobacco use disorder [F17.200] 09/18/2014  . 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 Cannabis use disorder, severe, dependence (HCC) [F12.20] 09/17/2014   Total Time spent with patient: 25 minutes  Past Psychiatric History: Please see H&P.   Past Medical History:  Past Medical History  Diagnosis Date  . Paranoid schizophrenia (HCC)   . Cannabis abuse   . Schizoaffective disorder, depressive type (HCC) 09/17/2014   History reviewed. No pertinent past surgical history. Family History:  Family History  Problem Relation Age of Onset  . Mental illness Brother   . Drug abuse Brother    Family Psychiatric  History: Please see H&P.  Social History:  History  Alcohol Use No     History  Drug Use  . Yes  . Special: Marijuana    Social History   Social History  . Marital Status: Single    Spouse Name: N/A  . Number of Children: N/A  . Years of Education: N/A   Social History Main Topics  . Smoking status: Never Smoker   . Smokeless tobacco: None  . Alcohol Use: No  . Drug Use: Yes    Special: Marijuana  . Sexual Activity: No   Other Topics Concern  . None   Social History Narrative   Additional Social History:    Pain Medications:  denies Prescriptions: denies abuse Over the Counter: denies History of alcohol / drug use?: Yes Longest period of sobriety (when/how long): A few days/weeks Negative Consequences of Use: Personal relationships, Work / School Withdrawal Symptoms: Irritability Name of Substance 1: Alcohol  1 - Age of First Use: 15 1 - Amount (size/oz): unknown  1 - Frequency: "occasionally"  1 - Duration: ongoing  1 - Last Use / Amount: 11-22-15 Name of Substance 2: THC  2 - Age of First Use: 12  2 - Amount (size/oz): "a lot"  2 - Frequency: daily  2 - Duration: ongoing  2 - Last Use / Amount: 11-24-15    Sleep: Fair  Appetite:  Fair  Current Medications: Current Facility-Administered Medications  Medication Dose Route Frequency Provider Last Rate Last Dose  . acetaminophen (TYLENOL) tablet 650 mg  650 mg Oral Q6H PRN Earney Navy, NP   650 mg at 12/01/15 0807  . benztropine (COGENTIN) tablet 0.5 mg  0.5 mg Oral BID Earney Navy, NP   0.5 mg at 12/01/15 0808  . divalproex (DEPAKOTE) DR tablet 500 mg  500 mg Oral Daily Jomarie Longs, MD   500 mg at 12/01/15 0808  . divalproex (DEPAKOTE) DR tablet 750 mg  750 mg Oral QPM Saramma Eappen, MD   750 mg at 11/30/15 1709  . hydrOXYzine (ATARAX/VISTARIL) tablet 25 mg  25 mg Oral Q6H PRN Earney Navy,  NP      . ibuprofen (ADVIL,MOTRIN) tablet 200 mg  200 mg Oral Q6H PRN Earney Navy, NP   200 mg at 11/28/15 2133  . LORazepam (ATIVAN) tablet 1 mg  1 mg Oral Q6H PRN Jomarie Longs, MD   1 mg at 11/28/15 2323   Or  . LORazepam (ATIVAN) injection 1 mg  1 mg Intramuscular Q6H PRN Saramma Eappen, MD      . magnesium hydroxide (MILK OF MAGNESIA) suspension 30 mL  30 mL Oral Daily PRN Earney Navy, NP      . nicotine (NICODERM CQ - dosed in mg/24 hours) patch 21 mg  21 mg Transdermal Daily Jomarie Longs, MD   21 mg at 12/01/15 0807  . QUEtiapine (SEROQUEL XR) 24 hr tablet 500 mg  500 mg Oral QHS Jomarie Longs, MD   500 mg at 11/30/15  2122  . QUEtiapine (SEROQUEL) tablet 50 mg  50 mg Oral TID PRN Jomarie Longs, MD      . traZODone (DESYREL) tablet 50 mg  50 mg Oral QHS,MR X 1 Earney Navy, NP   50 mg at 11/30/15 2123    Lab Results:  Results for orders placed or performed during the hospital encounter of 11/25/15 (from the past 48 hour(s))  Valproic acid level     Status: None   Collection Time: 11/30/15  6:23 AM  Result Value Ref Range   Valproic Acid Lvl 96 50.0 - 100.0 ug/mL    Comment: Performed at [facility]    Blood Alcohol level:  Lab Results  Component Value Date   [facility] <5 11/24/2015   ETH <5 08/11/2014    Physical Findings: AIMS: Facial and Oral Movements Muscles of Facial Expression: None, normal Lips and Perioral Area: None, normal Jaw: None, normal Tongue: None, normal,Extremity Movements Upper (arms, wrists, hands, fingers): None, normal Lower (legs, knees, ankles, toes): None, normal, Trunk Movements Neck, shoulders, hips: None, normal, Overall Severity Severity of abnormal movements (highest score from questions above): None, normal Incapacitation due to abnormal movements: None, normal Patient's awareness of abnormal movements (rate only patient's report): No Awareness, Dental Status Current problems with teeth and/or dentures?: No Does patient usually wear dentures?: No  CIWA:    COWS:     Musculoskeletal: Strength & Muscle Tone: within normal limits Gait & Station: normal Patient leans: N/A  Psychiatric Specialty Exam: Physical Exam  Nursing note and vitals reviewed. Constitutional: He is oriented to person, place, and time.  Musculoskeletal: Normal range of motion.  Neurological: He is alert and oriented to person, place, and time.  Psychiatric: He has a normal mood and affect. His behavior is normal.    Review of Systems  Psychiatric/Behavioral: Positive for depression. Negative for hallucinations and substance abuse. The patient is nervous/anxious.  The patient does not have insomnia.   All other systems reviewed and are negative.   Blood pressure 124/83, pulse 80, temperature 98.6 F (37 C), temperature source Oral, resp. rate 20, height  (1.905 m), weight 117.028 kg (258 lb).Body mass index is 32.25 kg/(m^2).  General Appearance: Guarded  Eye Contact:  Minimal  Speech:  Slow  Volume:  Decreased  Mood:  Dysphoric  Affect:  Depressed  Thought Process:  Linear and Descriptions of Associations: Intact  Orientation:  Full (Time, Place, and Person)  Thought Content:  Reports intermittent AH  Suicidal Thoughts:  "a lttle bit"  Homicidal Thoughts:  "sometimes"  No specific person mentioned  Memory:  Immediate;   Fair Recent;   Fair Remote;   Fair  Judgement:  Impaired  Insight:  Shallow  Psychomotor Activity:  Decreased  Concentration:  Concentration: Poor and Attention Span: Fair  Recall:  [employer]  Fund of Knowledge:  Fair  Language:  Fair  Akathisia:  No  Handed:  Right  AIMS (if indicated):     Assets:  Desire for Improvement  ADL's:  Intact  Cognition:  WNL  Sleep:  Number of Hours: 5.75   Treatment Plan Summary: Daily contact with patient to assess and evaluate symptoms and progress in treatment and Medication management continue Seroquel  XL 500 mg po qhs for psychosis. continue Seroquel 50 mg po tid prn for severe anxiety/psychosis/agitation. Continue Depakote DR to  500 mg po daily and 750 po qpm for mood sx. Depakote level 11/26/15- 73 -another level on 11/30/15. continue Trazodone 50 mg po qhs for sleep. continue to monitor vitals ,medication compliance and treatment side effects while patient is here.  monitor for medical issues as well as call consult as needed.   CSW will continue working on disposition. Collateral information obtained by CSW from patient's grandmother reviewed - see CSW notes in EHR. Patient to participate in therapeutic milieu .   Lindwood QuaSheila May Agustin, NP [facility] 12/01/2015, 2:41 PM Agree  with NP Progress Note as above

## 2015-12-01 NOTE — [facility] Group Notes (Signed)
   [facility]  12/01/2015  8:45 AM   Participation Quality: Alert, Appropriate and Oriented  Mood/Affect: Depressed and Flat  Depression Rating: 8  Anxiety Rating: 6  Thoughts of Suicide: Pt denies SI/HI  Will you contract for safety? Yes  Current AVH: Pt denies  Plan for Discharge/Comments: Pt attended discharge planning group and actively participated in group. CSW provided pt with today's workbook. Patient reports that he is unsure of where he will stay at discharge.   Transportation Means: CSW continuing to assess  Supports: No supports mentioned at this time  Timothy BruinKristin Sunny [name], MSW, Johnson [employer] JohnsonLCSW Clinical Social Worker [facility] [phone]

## 2015-12-01 NOTE — Tx Team (Addendum)
Interdisciplinary Treatment Plan Update (Adult)  Date:  12/01/2015   Time Reviewed:  9:30am  Progress in Treatment: Attending groups: Yes. Participating in groups:  Yes. Taking medication as prescribed:  Yes. Tolerating medication:  Yes. Family/Significant other contact made:  No Patient understands diagnosis:  No  Limited insight Discussing patient identified problems/goals with staff:  Yes, see initial care plan. Medical problems stabilized or resolved:  Yes. Denies suicidal/homicidal ideation: Yes. Issues/concerns per patient self-inventory:  No. Other:  New problem(s) identified:  Discharge Plan or Barriers: see below  Reason for Continuation of Hospitalization: Depression Hallucinations Medication stabilization  Comments:  Per EMS patient brought from home for SI. Patient's family called due to patient in closet with belt around neck. Patient states that he saw cousin hang himself and he can too. Patient did illegal drugs today and hasn't taken his medications today. Patient refused to answer if he wanted to harm anyone else besides himself. Pt endorses having visual hallucinations.   Will increase Seroquel to 400 mg po qhs for psychosis. Will make available seroquel 50 mg po tid prn for severe anxiety/psychosis/agitation. Will increase Depakote DR 500 mg po daily and 750 po qpm for mood sx. Depakote level 11/26/15- 73 -another level in 5 days. Will continue Trazodone 50 mg po qhs for sleep.  Estimated length of stay: 1-2 days  New goal(s):  Review of initial/current patient goals per problem list:   Review of initial/current patient goals per problem list:  1. Goal(s): Patient will participate in aftercare plan   Met: Yes   Target date: 3-5 days post admission date   As evidenced by: Patient will participate within aftercare plan AEB aftercare provider and housing plan at discharge being identified. 11/26/15:  Return home, follow up outpt 5/30: Goal met.  Patient will be provided information on shelters and provided with bus pass. He will follow up with outpatient services.   2. Goal (s): Patient will exhibit decreased depressive symptoms and suicidal ideations.   Met: Adequate for discharge per MD.    Target date: 3-5 days post admission date   As evidenced by: Patient will utilize self rating of depression at 3 or below and demonstrate decreased signs of depression or be deemed stable for discharge by MD 11/26/15:  Thoughts of suicide prior to admission; appears depressed today 5/29: Patient rates depression at 8. 5/30: Adequate for discharge per MD. Patient denies SI, reports feeling safe to discharge at this time.      5. Goal(s): Patient will demonstrate decreased signs of psychosis  * Met: Adequate for discharge per MD.   * Target date: 3-5 days post admission date  * As evidenced by: Patient will demonstrate decreased frequency of AVH or return to baseline function 11/26/15:  Pt non compliant with meds, smoking cannabis daily, endorses AH,VH 5/29: Goal progressing. 5/30: Adequate for discharge per MD. Patient denies AVH, reports feeling safe to discharge at this time.    Attendees: Patient:    Family:    Physician: Dr. Parke Poisson 12/01/2015 9:30 AM  Nursing:  Loletta Specter, Kerby Nora, RN 12/01/2015 9:30 AM  Clinical Social Worker: Erasmo Downer Neythan Kozlov, LCSW 12/01/2015 9:30 AM  Other: Peri Maris, LCSWA; [place], LCSW  12/01/2015 9:30 AM  Other:  12/01/2015 9:30 AM  Other:  12/01/2015 9:30 AM  Other:  May Clarene Reamer, NP 12/01/2015 9:30 AM  Other:      Tilden Fossa, LCSW Clinical Social Worker [facility] [phone]

## 2015-12-01 NOTE — Progress Notes (Signed)
D. Pt has been up and visible in milieu this evening, minimal interaction or participation in milieu. Pt has appeared flat and withdrawn and able to contract for safety on the unit. Pt did receive all medications without incident and did not verbalize any complaints of pain. A. Support and encouragement provided. R. Safety maintained, will continue to monitor.

## 2015-12-01 NOTE — Progress Notes (Signed)
D: Brylan denies SI/HI but [facility]. He says he feels depressed. He contracts for safety. He is polite, cooperative, and med compliant. He speaks softly and interacts minimally. He's been isolative for much of the day. A: Meds given as ordered. Q15 safety checks maintained. Support/encouragement offered. R: Pt remains free from harm and continues with treatment. Will continue to monitor for needs/safety.

## 2015-12-02 ENCOUNTER — Encounter (HOSPITAL_COMMUNITY): Payer: Self-pay | Admitting: Psychiatry

## 2015-12-02 MED ORDER — BENZTROPINE MESYLATE 0.5 MG PO TABS: 0.5000 mg | ORAL_TABLET | Freq: Two times a day (BID) | ORAL | Status: DC

## 2015-12-02 MED ORDER — QUETIAPINE FUMARATE ER 50 MG PO TB24: 500.0000 mg | ORAL_TABLET | Freq: Every day | ORAL | Status: DC

## 2015-12-02 MED ORDER — TRAZODONE HCL 50 MG PO TABS: 50.0000 mg | ORAL_TABLET | Freq: Every evening | ORAL | Status: DC | PRN

## 2015-12-02 MED ORDER — HYDROXYZINE HCL 25 MG PO TABS: 25.0000 mg | ORAL_TABLET | Freq: Four times a day (QID) | ORAL | Status: DC | PRN

## 2015-12-02 MED ORDER — DIVALPROEX SODIUM 500 MG PO DR TAB: 500.0000 mg | DELAYED_RELEASE_TABLET | Freq: Every day | ORAL | Status: DC

## 2015-12-02 MED ORDER — DIVALPROEX SODIUM 250 MG PO DR TAB: 750.0000 mg | DELAYED_RELEASE_TABLET | Freq: Every evening | ORAL | Status: DC

## 2015-12-02 MED ORDER — NICOTINE 21 MG/24HR TD PT24: 21.0000 mg | MEDICATED_PATCH | Freq: Every day | TRANSDERMAL | Status: DC

## 2015-12-02 MED ORDER — QUETIAPINE FUMARATE ER 300 MG PO TB24
300.0000 mg | ORAL_TABLET | Freq: Every day | ORAL | Status: DC
  Filled 2015-12-02: qty 7

## 2015-12-02 MED ORDER — QUETIAPINE FUMARATE ER 200 MG PO TB24
200.0000 mg | ORAL_TABLET | Freq: Every day | ORAL | Status: DC
  Filled 2015-12-02: qty 7

## 2015-12-02 NOTE — [facility] Group Notes (Signed)
[facility] Group Notes:  (Nursing/MHT/Case Management/Adjunct)  Date:  12/02/2015  Time:  12:28 PM  Type of Therapy:  Nurse Education  Participation Level:  Did Not Attend    Timothy [name] 12/02/2015, 12:28 PM

## 2015-12-02 NOTE — Progress Notes (Signed)
Patient discharged to lobby. Patient was stable and appreciative at that time. All papers, samples and prescriptions were given and valuables returned. Verbal understanding expressed. Denies SI/HI and A/VH. Patient given opportunity to express concerns and ask questions.  

## 2015-12-02 NOTE — Progress Notes (Signed)
D. Pt had been up and visible in milieu this evening, seen interacting minimally with peers. Pt has endorsed depression this evening and spoke about how he has been having on-going auditory hallucinations and spoke about how he has been having difficulties with sleep.  Pt was able to contract for safety on the unit and did receive all medications without incident. A. Pt provided with support and medication education. R. Pt verbalized understanding, safety maintained.

## 2015-12-02 NOTE — [facility] Suicide Risk Assessment (Signed)
[facility]   Principal Problem: Schizoaffective disorder, bipolar type [facility](HCC) Discharge Diagnoses:  Patient Active Problem List   Diagnosis Date Noted  . Schizoaffective disorder, bipolar type (HCC) [F25.0] 09/18/2014  . Tobacco use disorder [F17.200] 09/18/2014  . 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 Cannabis use disorder, severe, dependence (HCC) [F12.20] 09/17/2014    Total Time spent with patient: 30 minutes  Musculoskeletal: Strength & Muscle Tone: within normal limits Gait & Station: normal Patient leans: N/A  Psychiatric Specialty Exam: Review of Systems  Psychiatric/Behavioral: Positive for substance abuse. Negative for depression, suicidal ideas and hallucinations.  All other systems reviewed and are negative.   Blood pressure 101/55, pulse 122, temperature 98.6 F (37 C), temperature source Oral, resp. rate 15, height 6\' 3"  (1.905 m), weight 117.028 kg (258 lb).Body mass index is 32.25 kg/(m^2).  General Appearance: Fairly Groomed  Patent attorneyye Contact::  Fair  Speech:  Normal [id number]  Volume:  Normal  Mood:  Euthymic  Affect:  Congruent  Thought Process:  Goal Directed  Orientation:  Full (Time, Place, and Person)  Thought Content:  WDL  Suicidal Thoughts:  No  Homicidal Thoughts:  No  Memory:  Immediate;   Fair Recent;   Fair Remote;   Fair  Judgement:  Fair  Insight:  Fair  Psychomotor Activity:  Normal  Concentration:  Fair  Recall:  FiservFair  Fund of Knowledge:Fair  Language: Fair  Akathisia:  No  Handed:  Right  AIMS (if indicated):   0  Assets:  Desire for Improvement  Sleep:  Number of Hours: 6.5  Cognition: WNL  ADL's:  Intact   Mental Status Per Nursing Assessment::   On Admission:  Suicidal ideation indicated by patient  Demographic Factors:  Male  Loss Factors: NA  Historical Factors: NA  Risk Reduction Factors:   Positive social support  Continued Clinical Symptoms:  Alcohol/Substance  Abuse/Dependencies Previous Psychiatric Diagnoses and Treatments  Cognitive Features That Contribute To Risk:  None    Suicide Risk:  Minimal: No identifiable suicidal ideation.  Patients presenting with no risk factors but with morbid ruminations; may be classified as minimal risk based on the severity of the depressive symptoms  Follow-up Information    [facility].   Specialty:  Behavioral Health   Why:  Tuesday at 1:00    Contact information:   [address] [place] [address] [phone]       Plan Of Care/Follow-up recommendations:  Activity:  no restrictions Diet:  regular Tests:  as needed Other:  follow up on aftercare appointments  Mayzie Caughlin, MD 12/02/2015, 9:22 AM

## 2015-12-02 NOTE — Progress Notes (Signed)
  [facility] Adult Case Management Discharge Plan :  Will you be returning to the same living situation after discharge:  No. Patient will likely discharge to shelter At discharge, do you have transportation home?: Yes,  patient provided with bus pass Do you have the ability to pay for your medications: Yes,  patient will be provided with prescriptions at discharge  Release of information consent forms completed and in the chart;  Patient's signature needed at discharge.  Patient to Follow up at: Follow-up Information    [facility]   Specialty:  Behavioral Health   Why:  Hospital follow up appointment for medication management/therapy services on Tuesday June 6th at 1:00pm. Call office if you need to reschedule.    Contact information:   [address] [place] [address] [phone]       Next level of care provider has access to [facility] Link:no  Safety Planning and Suicide Prevention discussed: Yes,  with patient and grandmother  Have you used any form of tobacco in the last 30 days? (Cigarettes, Smokeless Tobacco, Cigars, and/or Pipes): Yes  Has patient been referred to the Quitline?: Patient refused referral  Patient has been referred for addiction treatment: Yes  Manjinder Breau, [place]Kristin L 12/02/2015, 9:32 AM

## 2015-12-02 NOTE — Discharge Summary (Signed)
Physician Discharge Summary Note  Patient:  Timothy [name] is an 20 y.o., male MRN:  [id number] DOB:  1995/07/25 Patient phone:  [phone] (home)  Patient address:   [address] [name] [address],  Total Time spent with patient: 45 minutes  Date of Admission:  11/25/2015 Date of Discharge: 12/02/2015   Reason for Admission:  Suicide attempt  Principal Problem: Schizoaffective disorder, bipolar type [facility]) Discharge Diagnoses: Patient Active Problem List   Diagnosis Date Noted  . Schizoaffective disorder, bipolar type (HCC) [F25.0] 09/18/2014  . Tobacco use disorder [F17.200] 09/18/2014  . 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 Cannabis use disorder, severe, dependence (HCC) [F12.20] 09/17/2014   Past Psychiatric History:  See HPI  Past Medical History:  Past Medical History  Diagnosis Date  . Paranoid schizophrenia (HCC)   . Cannabis abuse   . Schizoaffective disorder, depressive type (HCC) 09/17/2014   History reviewed. No pertinent past surgical history. Family History:  Family History  Problem Relation Age of Onset  . Mental illness Brother   . Drug abuse Brother    Family Psychiatric  History:  See HPI Social History:  History  Alcohol Use No     History  Drug Use  . Yes  . Special: Marijuana    Social History   Social History  . Marital Status: Single    Spouse Name: N/A  . Number of Children: N/A  . Years of Education: N/A   Social History Main Topics  . Smoking status: Never Smoker   . Smokeless tobacco: None  . Alcohol Use: No  . Drug Use: Yes    Special: Marijuana  . Sexual Activity: No   Other Topics Concern  . None   Social History Narrative    Hospital Course:  Timothy [name] is a 20 y.o. AA Male, single , lives with grandmother in [place] , has a hx of schizoaffective do , who presented to [facility] after a suicide attempt.    Timothy [name] was admitted for Schizoaffective disorder, bipolar type [facility]) and  crisis management.  He was treated with medications and their indications listed below.  Medical problems were identified and treated as needed.  Home medications were restarted as appropriate.  Improvement was monitored by observation and Timothy [name] daily report of symptom reduction.  Emotional and mental status was monitored by daily self inventory reports completed by Timothy [name] and clinical staff.  Patient reported continued improvement, denied any new concerns.  Patient had been compliant on medications and denied side effects.  Support and encouragement was provided.    Patient did well during inpatient stay.  At time of discharge, patient rated both depression and anxiety levels to be manageable and minimal.  Patient was able to identify the triggers of emotional crises and de-stabilizations.  Patient identified the positive things in life that would help in dealing with feelings of loss, depression and unhealthy or abusive tendencies.         Timothy [name] was evaluated by the treatment team for stability and plans for continued recovery upon discharge.  He was offered further treatment options upon discharge including Residential, Intensive Outpatient and Outpatient treatment. He will follow up with agencies listed below   for medication management and counseling.  Encouraged patient to maintain satisfactory support network and home environment.  Advised to adhere to medication compliance and outpatient treatment follow up.      Timothy [name] motivation was an integral factor  for scheduling further treatment.  Employment, transportation, bed availability, health status, family support, and any pending legal issues were also considered during his hospital stay.  Upon completion of this admission the patient was both mentally and medically stable for discharge denying suicidal/homicidal ideation, auditory/visual/tactile hallucinations, delusional thoughts and paranoia.        Physical  Findings: AIMS: Facial and Oral Movements Muscles of Facial Expression: None, normal Lips and Perioral Area: None, normal Jaw: None, normal Tongue: None, normal,Extremity Movements Upper (arms, wrists, hands, fingers): None, normal Lower (legs, knees, ankles, toes): None, normal, Trunk Movements Neck, shoulders, hips: None, normal, Overall Severity Severity of abnormal movements (highest score from questions above): None, normal Incapacitation due to abnormal movements: None, normal Patient's awareness of abnormal movements (rate only patient's report): No Awareness, Dental Status Current problems with teeth and/or dentures?: No Does patient usually wear dentures?: No  CIWA:    COWS:     Musculoskeletal: Strength & Muscle Tone: within normal limits Gait & Station: normal Patient leans: N/A  Psychiatric Specialty Exam:  See MD SRA Physical Exam  Vitals reviewed. Psychiatric: Angry: improving.    Review of Systems  Psychiatric/Behavioral: Negative for suicidal ideas, hallucinations and substance abuse.  All other systems reviewed and are negative.   Blood pressure 101/55, pulse 122, temperature 98.6 F (37 C), temperature source Oral, resp. rate 15, height  (1.905 m), weight 117.028 kg (258 lb).Body mass index is 32.25 kg/(m^2).   Have you used any form of tobacco in the last 30 days? (Cigarettes, Smokeless Tobacco, Cigars, and/or Pipes): Yes  Has this patient used any form of tobacco in the last 30 days? (Cigarettes, Smokeless Tobacco, Cigars, and/or Pipes) Yes, patient given RX  Blood Alcohol level:  Lab Results  Component Value Date   ETH <5 11/24/2015   ETH <5 08/11/2014    Metabolic Disorder Labs:  Lab Results  Component Value Date   HGBA1C 5.9* 11/26/2015   MPG 123 11/26/2015   MPG 111 09/19/2014   No results found for: PROLACTIN Lab Results  Component Value Date   CHOL 240* 11/26/2015   TRIG 149 11/26/2015   HDL 41 11/26/2015   CHOLHDL 5.9 11/26/2015    VLDL 30 11/26/2015   LDLCALC 169* 11/26/2015   LDLCALC 100* 09/19/2014    See Psychiatric Specialty Exam and Suicide Risk Assessment completed by Attending Physician prior to discharge.  Discharge destination:  Home  Is patient on multiple antipsychotic therapies at discharge:  No   Has Patient had three or more failed trials of antipsychotic monotherapy by history:  No  Recommended Plan for Multiple Antipsychotic Therapies: NA     Medication List    STOP taking these medications        citalopram 20 MG tablet  Commonly known as:  CELEXA     ibuprofen 200 MG tablet  Commonly known as:  ADVIL,MOTRIN      TAKE these medications      Indication   benztropine 0.5 MG tablet  Commonly known as:  COGENTIN  Take 1 tablet (0.5 mg total) by mouth 2 (two) times daily.   Indication:  Extrapyramidal Reaction caused by Medications     divalproex 500 MG DR tablet  Commonly known as:  DEPAKOTE  Take 1 tablet (500 mg total) by mouth daily.   Indication:  mood stabilization     divalproex 250 MG DR tablet  Commonly known as:  DEPAKOTE  Take 3 tablets (750 mg total) by mouth every evening.  Indication:  mood stabilization     hydrOXYzine 25 MG tablet  Commonly known as:  ATARAX/VISTARIL  Take 1 tablet (25 mg total) by mouth every 6 (six) hours as needed for anxiety.   Indication:  anxiety     nicotine 21 mg/24hr patch  Commonly known as:  NICODERM CQ - dosed in mg/24 hours  Place 1 patch (21 mg total) onto the skin daily.   Indication:  Nicotine Addiction     QUEtiapine 50 MG Tb24 24 hr tablet  Commonly known as:  SEROQUEL XR  Take 10 tablets (500 mg total) by mouth at bedtime.   Indication:  Major Depressive Disorder, mood stabilization     traZODone 50 MG tablet  Commonly known as:  DESYREL  Take 1 tablet (50 mg total) by mouth at bedtime and may repeat dose one time if needed.   Indication:  Trouble Sleeping           Follow-up Information    [facility].   Specialty:  Behavioral Health   Why:  Hospital follow up appointment for medication management/therapy services on Tuesday June 6th at 1:00pm. Call office if you need to reschedule.    Contact informationElpidio Eric:   [address] [place] [address] [phone]       Follow-up recommendations:  Activity:  as tol Diet:  as tol  Comments:  1.  Take all your medications as prescribed.   2.  Report any adverse side effects to outpatient provider. 3.  Patient instructed to not use alcohol or illegal drugs while on prescription medicines. 4.  In the event of worsening symptoms, instructed patient to call 911, the crisis hotline or go to nearest emergency room for evaluation of symptoms.  Signed: Lindwood QuaSheila May Colleen Donahoe, NP [facility] 12/02/2015, 1:21 PM

## 2016-03-10 ENCOUNTER — Emergency Department (HOSPITAL_COMMUNITY)
Admission: EM | Admit: 2016-03-10 | Discharge: 2016-03-11 | Payer: Self-pay | Attending: Emergency Medicine | Admitting: Emergency Medicine

## 2016-03-10 ENCOUNTER — Encounter (HOSPITAL_COMMUNITY): Payer: Self-pay

## 2016-03-10 DIAGNOSIS — Z79899 Other long term (current) drug therapy: Secondary | ICD-10-CM | POA: Insufficient documentation

## 2016-03-10 DIAGNOSIS — R443 Hallucinations, unspecified: Secondary | ICD-10-CM

## 2016-03-10 DIAGNOSIS — F250 Schizoaffective disorder, bipolar type: Secondary | ICD-10-CM | POA: Diagnosis present

## 2016-03-10 DIAGNOSIS — F259 Schizoaffective disorder, unspecified: Secondary | ICD-10-CM

## 2016-03-10 DIAGNOSIS — F1290 Cannabis use, unspecified, uncomplicated: Secondary | ICD-10-CM | POA: Insufficient documentation

## 2016-03-10 DIAGNOSIS — G2111 Neuroleptic induced parkinsonism: Secondary | ICD-10-CM | POA: Insufficient documentation

## 2016-03-10 LAB — COMPREHENSIVE METABOLIC PANEL
ALBUMIN: 3.9 g/dL (ref 3.5–5.0)
ALK PHOS: 47 U/L (ref 38–126)
ALT: 48 U/L (ref 17–63)
AST: 43 U/L — AB (ref 15–41)
Anion gap: 4 — ABNORMAL LOW (ref 5–15)
BILIRUBIN TOTAL: 1 mg/dL (ref 0.3–1.2)
BUN: 16 mg/dL (ref 6–20)
CALCIUM: 9.1 mg/dL (ref 8.9–10.3)
CO2: 29 mmol/L (ref 22–32)
Chloride: 105 mmol/L (ref 101–111)
Creatinine, Ser: 1.4 mg/dL — ABNORMAL HIGH (ref 0.61–1.24)
GFR calc Af Amer: >60 mL/min (ref >60)
GLUCOSE: 100 mg/dL — AB (ref 65–99)
POTASSIUM: 4.2 mmol/L (ref 3.5–5.1)
Sodium: 138 mmol/L (ref 135–145)
TOTAL PROTEIN: 7.4 g/dL (ref 6.5–8.1)

## 2016-03-10 LAB — VALPROIC ACID LEVEL: Valproic Acid Lvl: <10 ug/mL — ABNORMAL LOW (ref 50.0–100.0)

## 2016-03-10 LAB — CBC
HCT: 42.8 % (ref 39.0–52.0)
HEMOGLOBIN: 14.3 g/dL (ref 13.0–17.0)
MCH: 29.8 pg (ref 26.0–34.0)
MCHC: 33.4 g/dL (ref 30.0–36.0)
MCV: 89.2 fL (ref 78.0–100.0)
PLATELETS: 208 10*3/uL (ref 150–400)
RBC: 4.8 MIL/uL (ref 4.22–5.81)
RDW: 13.1 % (ref 11.5–15.5)
WBC: 7.7 10*3/uL (ref 4.0–10.5)

## 2016-03-10 LAB — ETHANOL

## 2016-03-10 MED ORDER — DIVALPROEX SODIUM 500 MG PO DR TAB
750.0000 mg | DELAYED_RELEASE_TABLET | Freq: Every evening | ORAL | Status: DC
  Administered 2016-03-10: 750 mg via ORAL
  Filled 2016-03-10: qty 1

## 2016-03-10 MED ORDER — LORAZEPAM 1 MG PO TABS: 1.0000 mg | ORAL_TABLET | Freq: Three times a day (TID) | ORAL | Status: DC | PRN

## 2016-03-10 MED ORDER — ALUM & MAG HYDROXIDE-SIMETH 200-200-20 MG/5ML PO SUSP: 30.0000 mL | ORAL | Status: DC | PRN

## 2016-03-10 MED ORDER — HYDROXYZINE HCL 25 MG PO TABS: 25.0000 mg | ORAL_TABLET | Freq: Four times a day (QID) | ORAL | Status: DC | PRN

## 2016-03-10 MED ORDER — QUETIAPINE FUMARATE ER 300 MG PO TB24
500.0000 mg | ORAL_TABLET | Freq: Every day | ORAL | Status: DC
  Administered 2016-03-10: 500 mg via ORAL
  Filled 2016-03-10 (×2): qty 1

## 2016-03-10 MED ORDER — TRAZODONE HCL 50 MG PO TABS
50.0000 mg | ORAL_TABLET | Freq: Every evening | ORAL | Status: DC | PRN
  Administered 2016-03-10: 50 mg via ORAL
  Filled 2016-03-10: qty 1

## 2016-03-10 MED ORDER — DIVALPROEX SODIUM 500 MG PO DR TAB: 500.0000 mg | DELAYED_RELEASE_TABLET | Freq: Every day | ORAL | Status: DC

## 2016-03-10 MED ORDER — ACETAMINOPHEN 325 MG PO TABS: 650.0000 mg | ORAL_TABLET | ORAL | Status: DC | PRN

## 2016-03-10 MED ORDER — ONDANSETRON HCL 4 MG PO TABS: 4.0000 mg | ORAL_TABLET | Freq: Three times a day (TID) | ORAL | Status: DC | PRN

## 2016-03-10 MED ORDER — IBUPROFEN 200 MG PO TABS: 600.0000 mg | ORAL_TABLET | Freq: Three times a day (TID) | ORAL | Status: DC | PRN

## 2016-03-10 MED ORDER — BENZTROPINE MESYLATE 1 MG PO TABS
0.5000 mg | ORAL_TABLET | Freq: Two times a day (BID) | ORAL | Status: DC
  Administered 2016-03-10: 0.5 mg via ORAL
  Filled 2016-03-10: qty 1

## 2016-03-10 NOTE — ED Notes (Signed)
Bed: WTR9 Expected date:  Expected time:  Means of arrival:  Comments: 

## 2016-03-10 NOTE — ED Provider Notes (Signed)
[facility]-EMERGENCY DEPT Provider Note   CSN: [id number] Arrival date & time: 03/10/16  [id number]     History   Chief Complaint Chief Complaint  Patient presents with  . Hallucinations    HPI Timothy [name] is a 20 y.o. male.  HPI Patient presents for hallucinations. States she's been having auditory and visual hallucinations. History of schizophrenia. States his been taking his medications. States he sees people that he knows and faces that he knows. Denies substance abuse. States he has had some suicidal thoughts also. Denies plan. Does have a flat affect and somewhat difficult to get history from. He sees [facility] for his psychiatric care but has not seen them for a while.  Past Medical History:  Diagnosis Date  . Cannabis abuse   . Paranoid schizophrenia (HCC)   . Schizoaffective disorder, depressive type (HCC) 09/17/2014    Patient Active Problem List   Diagnosis Date Noted  . Schizoaffective disorder, bipolar type (HCC) 09/18/2014  . Tobacco use disorder 09/18/2014  . 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 Cannabis use disorder, severe, dependence (HCC) 09/17/2014    History reviewed. No pertinent surgical history.     Home Medications    Prior to Admission medications   Medication Sig Start Date End Date Taking? Authorizing Provider  benztropine (COGENTIN) 0.5 MG tablet Take 1 tablet (0.5 mg total) by mouth 2 (two) times daily. 12/02/15  Yes Adonis BrookSheila Agustin, NP  divalproex (DEPAKOTE) 250 MG DR tablet Take 3 tablets (750 mg total) by mouth every evening. 12/02/15  Yes Adonis BrookSheila Agustin, NP  divalproex (DEPAKOTE) 500 MG DR tablet Take 1 tablet (500 mg total) by mouth daily. 12/02/15  Yes Adonis BrookSheila Agustin, NP  hydrOXYzine (ATARAX/VISTARIL) 25 MG tablet Take 1 tablet (25 mg total) by mouth every 6 (six) hours as needed for anxiety. 12/02/15  Yes Adonis BrookSheila Agustin, NP  nicotine (NICODERM CQ - DOSED IN MG/24 HOURS) 21 mg/24hr patch Place 1 patch (21 mg total) onto the skin  daily. 12/02/15  Yes Adonis BrookSheila Agustin, NP  QUEtiapine (SEROQUEL XR) 50 MG TB24 24 hr tablet Take 10 tablets (500 mg total) by mouth at bedtime. 12/02/15  Yes Adonis BrookSheila Agustin, NP  traZODone (DESYREL) 50 MG tablet Take 1 tablet (50 mg total) by mouth at bedtime and may repeat dose one time if needed. 12/02/15  Yes Adonis BrookSheila Agustin, NP    Family History Family History  Problem Relation Age of Onset  . Mental illness Brother   . Drug abuse Brother     Social History Social History  Substance Use Topics  . Smoking status: Never Smoker  . Smokeless tobacco: Never Used  . Alcohol use No     Allergies   Haldol [haloperidol lactate]; Abilify [aripiprazole]; Penicillins; and Zyprexa [olanzapine]   Review of Systems Review of Systems  Constitutional: Negative for appetite change.  Respiratory: Negative for chest tightness.   Cardiovascular: Negative for chest pain.  Gastrointestinal: Negative for abdominal pain.  Genitourinary: Negative for flank pain.  Musculoskeletal: Negative for back pain.  Neurological: Negative for numbness.  Psychiatric/Behavioral: Positive for dysphoric mood, hallucinations and suicidal ideas.     Physical Exam Updated Vital Signs BP 148/75 (BP Location: Right Arm)   Pulse 70   Temp 98.1 F (36.7 C) (Oral)   Resp 18   SpO2 99%   Physical Exam  Constitutional: He appears well-developed.  HENT:  Head: Atraumatic.  Eyes: Pupils are equal, round, and reactive to light.  Neck: Neck supple.  Cardiovascular: Normal rate.  Pulmonary/Chest: Effort normal.  Abdominal: Soft. There is no tenderness.  Musculoskeletal: He exhibits no edema.  Neurological: He is alert.  Skin: Skin is warm.  Psychiatric:  Patient has a flat affect. He does not appear to be responding to internal stimuli.     ED Treatments / Results  Labs (all labs ordered are listed, but only abnormal results are displayed) Labs Reviewed  COMPREHENSIVE METABOLIC PANEL - Abnormal; Notable for  the following:       Result Value   Glucose, Bld 100 (*)    Creatinine, Ser 1.40 (*)    AST 43 (*)    Anion gap 4 (*)    All other components within normal limits  VALPROIC ACID LEVEL - Abnormal; Notable for the following:    Valproic Acid Lvl <10 (*)    All other components within normal limits  ETHANOL  CBC  URINE RAPID DRUG SCREEN, HOSP PERFORMED    EKG  EKG Interpretation None       Radiology No results found.  Procedures Procedures (including critical care time)  Medications Ordered in ED Medications - No data to display   Initial Impression / Assessment and Plan / ED Course  I have reviewed the triage vital signs and the nursing notes.  Pertinent labs & imaging results that were available during my care of the patient were reviewed by me and considered in my medical decision making (see chart for details).  Clinical Course    Patient presents with hallucinations. Also some depression and possible suicidal thoughts. Medically cleared at this time but urine drug screen still pending. To be seen by TTS.  Final Clinical Impressions(s) / ED Diagnoses   Final diagnoses:  Hallucinations  Schizoaffective disorder, unspecified type [facility])    New Prescriptions New Prescriptions   No medications on file     Benjiman Core, MD 03/10/16 [id number]

## 2016-03-10 NOTE — ED Triage Notes (Signed)
Pt states that he has been seeing faces and hearing voices, denies SI/HI, pt is voluntary

## 2016-03-11 ENCOUNTER — Inpatient Hospital Stay (HOSPITAL_COMMUNITY)
Admission: EM | Admit: 2016-03-11 | Discharge: 2016-03-18 | DRG: 885 | Disposition: A | Discharge status: Home / Self Care | Payer: Federal, State, Local not specified - Other | Source: Intra-hospital | Attending: Psychiatry | Admitting: Psychiatry

## 2016-03-11 ENCOUNTER — Encounter (HOSPITAL_COMMUNITY): Payer: Self-pay | Admitting: Psychiatry

## 2016-03-11 DIAGNOSIS — F250 Schizoaffective disorder, bipolar type: Secondary | ICD-10-CM | POA: Diagnosis present

## 2016-03-11 DIAGNOSIS — F411 Generalized anxiety disorder: Secondary | ICD-10-CM | POA: Diagnosis present

## 2016-03-11 DIAGNOSIS — F1220 Cannabis dependence, uncomplicated: Secondary | ICD-10-CM | POA: Diagnosis present

## 2016-03-11 DIAGNOSIS — F4310 Post-traumatic stress disorder, unspecified: Secondary | ICD-10-CM | POA: Diagnosis present

## 2016-03-11 DIAGNOSIS — G4700 Insomnia, unspecified: Secondary | ICD-10-CM | POA: Diagnosis present

## 2016-03-11 DIAGNOSIS — Z818 Family history of other mental and behavioral disorders: Secondary | ICD-10-CM | POA: Diagnosis not present

## 2016-03-11 DIAGNOSIS — R45851 Suicidal ideations: Secondary | ICD-10-CM | POA: Diagnosis present

## 2016-03-11 DIAGNOSIS — G2111 Neuroleptic induced parkinsonism: Secondary | ICD-10-CM | POA: Diagnosis present

## 2016-03-11 DIAGNOSIS — Z62810 Personal history of physical and sexual abuse in childhood: Secondary | ICD-10-CM | POA: Diagnosis present

## 2016-03-11 DIAGNOSIS — Z833 Family history of diabetes mellitus: Secondary | ICD-10-CM | POA: Diagnosis not present

## 2016-03-11 DIAGNOSIS — F17200 Nicotine dependence, unspecified, uncomplicated: Secondary | ICD-10-CM | POA: Diagnosis present

## 2016-03-11 LAB — RAPID URINE DRUG SCREEN, HOSP PERFORMED
Amphetamines: NOT DETECTED
Barbiturates: NOT DETECTED
Benzodiazepines: NOT DETECTED
COCAINE: NOT DETECTED
OPIATES: NOT DETECTED
Tetrahydrocannabinol: NOT DETECTED

## 2016-03-11 MED ORDER — MAGNESIUM HYDROXIDE 400 MG/5ML PO SUSP: 30.0000 mL | Freq: Every day | ORAL | Status: DC | PRN

## 2016-03-11 MED ORDER — QUETIAPINE FUMARATE ER 300 MG PO TB24
500.0000 mg | ORAL_TABLET | Freq: Every day | ORAL | Status: DC
  Administered 2016-03-11 – 2016-03-14 (×4): 500 mg via ORAL
  Filled 2016-03-11 (×7): qty 1

## 2016-03-11 MED ORDER — TRAZODONE HCL 50 MG PO TABS
50.0000 mg | ORAL_TABLET | Freq: Every evening | ORAL | Status: DC | PRN
  Administered 2016-03-11 – 2016-03-17 (×7): 50 mg via ORAL
  Filled 2016-03-11 (×4): qty 1
  Filled 2016-03-11: qty 14
  Filled 2016-03-11: qty 1
  Filled 2016-03-11: qty 14
  Filled 2016-03-11 (×11): qty 1

## 2016-03-11 MED ORDER — ACETAMINOPHEN 325 MG PO TABS
650.0000 mg | ORAL_TABLET | ORAL | Status: DC | PRN
  Administered 2016-03-15: 650 mg via ORAL
  Filled 2016-03-11: qty 2

## 2016-03-11 MED ORDER — BENZTROPINE MESYLATE 0.5 MG PO TABS
0.5000 mg | ORAL_TABLET | Freq: Two times a day (BID) | ORAL | Status: DC
  Administered 2016-03-11 – 2016-03-18 (×15): 0.5 mg via ORAL
  Filled 2016-03-11 (×12): qty 1
  Filled 2016-03-11: qty 14
  Filled 2016-03-11 (×2): qty 1
  Filled 2016-03-11: qty 14
  Filled 2016-03-11 (×4): qty 1

## 2016-03-11 MED ORDER — DIVALPROEX SODIUM 250 MG PO DR TAB
750.0000 mg | DELAYED_RELEASE_TABLET | Freq: Every evening | ORAL | Status: DC
  Administered 2016-03-11 – 2016-03-17 (×8): 750 mg via ORAL
  Filled 2016-03-11 (×2): qty 3
  Filled 2016-03-11: qty 21
  Filled 2016-03-11 (×8): qty 3

## 2016-03-11 MED ORDER — HYDROXYZINE HCL 25 MG PO TABS
25.0000 mg | ORAL_TABLET | Freq: Four times a day (QID) | ORAL | Status: DC | PRN
  Filled 2016-03-11: qty 10

## 2016-03-11 MED ORDER — DIVALPROEX SODIUM 500 MG PO DR TAB
500.0000 mg | DELAYED_RELEASE_TABLET | Freq: Every day | ORAL | Status: DC
  Administered 2016-03-11 – 2016-03-18 (×7): 500 mg via ORAL
  Filled 2016-03-11: qty 1
  Filled 2016-03-11: qty 7
  Filled 2016-03-11 (×8): qty 1

## 2016-03-11 NOTE — Progress Notes (Signed)
Recreation Therapy Notes  INPATIENT RECREATION THERAPY ASSESSMENT  Patient Details Name: Timothy [name] MRN: [id number] DOB: 08/06/1995 Today's Date: 03/11/2016  Patient Stressors: Family, Relationship, Friends, Other (Comment) (Not working)  Pt stated he was here because he was seeing things.  Coping Skills:   Isolate, Avoidance, Self-Injury, Exercise, Art/Dance, Talking, Music  Personal Challenges: Anger, Communication, Concentration, Decision-Making, Expressing Yourself, Problem-Solving, Relationships, Self-Esteem/Confidence, Social Interaction, Stress Management, Substance Abuse, Time Management, Trusting Others  Leisure Interests (2+):  Individual - TV, Nature - Other (Comment) ([place]  Awareness of Community Resources:  Yes  Community Resources:  Park  Current Use: Yes  Patient Strengths:  "Chief Executive Officerhard worker, get things done"  Patient Identified Areas of Improvement:  "bills, friends"  Current Recreation Participation:  "twice a month"  Patient Goal for Hospitalization:  "get well"  [place]  [place]   Current [place] (including self-harm):  No  Current HI:  No  Consent to Intern Participation: N/A   Timothy [name], LRT/CTRS   Lillia AbedLindsay, Avo Schlachter A 03/11/2016, 2:26 PM
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
## 2016-03-11 NOTE — ED Notes (Signed)
[facility].  [facility] called for transport.

## 2016-03-11 NOTE — [facility] Group Notes (Signed)
[facility] LCSW Group Therapy  03/11/2016 1:15 pm  Type of Therapy: Process Group Therapy  Participation Level:  Active  Participation Quality:  Appropriate  Affect:  Flat  Cognitive:  Oriented  Insight:  Improving  Engagement in Group:  Limited  Engagement in Therapy:  Limited  Modes of Intervention:  Activity, Clarification, Education, Problem-solving and Support  Summary of Progress/Problems: Today's group addressed the issue of overcoming obstacles.  Patients were asked to identify their biggest obstacle post d/c that stands in the way of their on-going success, and then problem solve as to how to manage this. Invited.  Chose to not attend.  Timothy [name] B 03/11/2016   2:37 PM   

## 2016-03-11 NOTE — BH Assessment (Signed)
McKenzie Hunter-Spaugh, RN [facility]. Assessor asked to be connected to the MD to inform him of the pt's disposition but was unable to be connected.  Princess BruinsAquicha Duff, MSW, Theresia MajorsLCSWA
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
## 2016-03-11 NOTE — ED Notes (Signed)
TTS currently interviewing pt.

## 2016-03-11 NOTE — BH Assessment (Signed)
Asessesment in progress

## 2016-03-11 NOTE — ED Notes (Addendum)
[facility] & can [facility] @ 0800 this a.m.  Bed assignment 506-1.

## 2016-03-11 NOTE — Tx Team (Addendum)
Initial Treatment Plan 03/11/2016 5:10 PM Timothy FeltyMarkie T Kearl ZOX:[id number]    PATIENT STRESSORS: Medication change or noncompliance Substance abuse Increased psychotic symptoms    PATIENT STRENGTHS: Ability for insight General fund of knowledge Motivation for treatment/growth   PATIENT IDENTIFIED PROBLEMS: "I hear whispers that I can't make out"  "I see faces of dead relatives."  SI                 DISCHARGE CRITERIA:  Improved stabilization in mood, thinking, and/or behavior Verbal commitment to aftercare and medication compliance  PRELIMINARY DISCHARGE PLAN: Return to previous living arrangement  PATIENT/FAMILY INVOLVEMENT: This treatment plan has been presented to and reviewed with the patient, Timothy [name], and/or family member.  The patient and family have been given the opportunity to ask questions and make suggestions.  Jerrye BushyLaRonica R Waller, RN 03/11/2016, 5:10 PM

## 2016-03-11 NOTE — Progress Notes (Signed)
Patient ID: Timothy [name], male   DOB: 12/21/1995, 20 y.o.   MRN: [id number] D: Client stays in room this shift, sleeping. Client does Printmakeracknowledge writer when questioned about his day. Client reports day been "alright" client appears to be preoccupied, but pleasant says "thank you ma'am" A: Writer encourages client to verbalize feelings. Medications reviewed, administered as ordered. Staff will monitor q3215min for safety. R: client is safe on the unit, did not attend group.

## 2016-03-11 NOTE — [facility] Suicide Risk Assessment (Signed)
[facility]   Nursing information obtained from:    Demographic factors:    Current Mental Status:    Loss Factors:    Historical Factors:    Risk Reduction Factors:     Total Time spent with patient: 30 minutes Principal Problem: Schizoaffective disorder, bipolar type (H[facility]) Diagnosis:   Patient Active Problem List   Diagnosis Date Noted  . Schizoaffective disorder, bipolar type (H[facility]) [F25.0] 09/18/2014  . Tobacco use disorder [F17.200] 09/18/2014  . 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 Cannabis use disorder, severe, dependence ([facility]) [F12.20] 09/17/2014   Subjective Data: Please see H&P.   Continued Clinical Symptoms:    The "Alcohol Use Disorders Identification Test", Guidelines for Use in Primary Care, Second Edition.  World Science writerHealth Organization [facility](WHO). Score between 0-7:  no or low risk or alcohol related problems. Score between 8-15:  moderate risk of alcohol related problems. Score between 16-19:  high risk of alcohol related problems. Score 20 or above:  warrants further diagnostic evaluation for alcohol dependence and treatment.   CLINICAL FACTORS:   Unstable or Poor Therapeutic Relationship Previous Psychiatric Diagnoses and Treatments   Musculoskeletal: Strength & Muscle Tone: within normal limits Gait & Station: normal Patient leans: N/A  Psychiatric Specialty Exam: Physical Exam  Nursing note and vitals reviewed.   ROS  There were no vitals taken for this visit.There is no height or weight on file to calculate BMI.                Please see H&P.                                           COGNITIVE FEATURES THAT CONTRIBUTE TO RISK:  Closed-mindedness, Polarized thinking and Thought constriction (tunnel vision)    SUICIDE RISK:   Mild:  Suicidal ideation of limited frequency, intensity, duration, and specificity.  There are no identifiable plans, no associated intent, mild  dysphoria and related symptoms, good self-control (both objective and subjective assessment), few other risk factors, and identifiable protective factors, including available and accessible social support.   PLAN OF CARE: Please see H&P.   I certify that inpatient services furnished can reasonably be expected to improve the patient's condition.  Timothy Lovering, MD 03/11/2016, 2:01 PM

## 2016-03-11 NOTE — ED Notes (Signed)
Belongings removed from locker and sent with patient. °
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
## 2016-03-11 NOTE — Progress Notes (Signed)
Recreation Therapy Notes  Date: 03/11/16 Time: 1000 Location: 500 Hall Dayroom  Group Topic: Leisure Education  Goal Area(s) Addresses:  Patient will identify positive leisure activities.  Patient will identify one positive benefit of participation in leisure activities.   Behavioral Response: Engaged   Intervention: 20 styrofoam/plastic cups, 2 soft sponge balls  Activity: Bowling.  LRT set up the cups so there would be 2 bowling "lanes".  LRT divided the group into teams.  Each person would get two chances to knock down as many "pins" as possible.  Each cup that got knocked down equalled one point.  The team that reached the top score first won.  Education:  Leisure Education, Building control surveyorDischarge Planning  Education Outcome: Acknowledges education/In group clarification offered/Needs additional education  Clinical Observations/Feedback: Pt was a new admit.  Pt was quiet but participated.  Pt would occasionally smile when someone said something funny.    Caroll RancherMarjette Winni [name], LRT/CTRS         Caroll RancherLindsay, Mana Haberl A 03/11/2016 12:59 PM

## 2016-03-12 NOTE — Progress Notes (Signed)
Recreation Therapy Notes  03/12/16 1310:  LRT met with patient to find out if he would be interested in any activities such as cards, coloring sheets or word searches.  Pt stated he wanted some word searches.  LRT asked pt if there was a specific topic he would be interested in.  Patient stated no.  LRT printed out word searches and gave them to patient.   Victorino Sparrow, LRT/CTRS     Ria Comment, Zachari Alberta A 03/12/2016 2:30 PM

## 2016-03-12 NOTE — Progress Notes (Signed)
[facility] MD Progress Note  03/12/2016 10:08 AM Timothy [name]  MRN:  [id number] Subjective: Patient states " I am suicidal with whatever plan I can find."   Objective:Timothy [name] is a 20 y.o. AA Male, single , lives in a Recovery house , has a hx of schizoaffective do , who presented to [facility] after worsening depressive sx.  Patient seen and chart reviewed.Discussed patient with treatment team.  Patient today is seen as flat, withdrawn , continues to have SI - reports plan " whatever he can find." Pt also continues to have VH of faces - although improving. Per staff - pt often withdrawn to room, flat, isolative and paranoid. Will continue to encourage and support.     Principal Problem: Schizoaffective disorder, bipolar type ([place]) Diagnosis:   Patient Active Problem List   Diagnosis Date Noted  . Schizoaffective disorder, bipolar type ([place]) [F25.0] 09/18/2014  . Tobacco use disorder [F17.200] 09/18/2014  . 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 Cannabis use disorder, severe, dependence ([place]) [F12.20] 09/17/2014   Total Time spent with patient: 25 minutes  Past Psychiatric History: Please see H&P.   Past Medical History:  Past Medical History:  Diagnosis Date  . Cannabis abuse   . Paranoid schizophrenia ([place])   . Schizoaffective disorder, depressive type ([place]) 09/17/2014   History reviewed. No pertinent surgical history. Family History:  Family History  Problem Relation Age of Onset  . Mental illness Brother   . Drug abuse Brother    Family Psychiatric  History: Please see H&P.  Social History: Please see H&P.  History  Alcohol Use No     History  Drug Use  . Types: Marijuana    Social History   Social History  . Marital status: Single    Spouse name: N/A  . Number of children: N/A  . Years of education: N/A   Social History Main Topics  . Smoking status: Never Smoker  . Smokeless tobacco: Never Used  . Alcohol use No  . Drug use:      Types: Marijuana  . Sexual activity: No   Other Topics Concern  . None   Social History Narrative  . None   Additional Social History:    Pain Medications: (P) denies  Prescriptions: (P) denies  Over the Counter: (P) denies  History of alcohol / drug use?: (P) Yes Name of Substance 1: (P) THC 1 - Age of First Use: (P) 13 1 - Amount (size/oz): (P) 1/4 oz  1 - Frequency: (P) daily                  Sleep: Fair  Appetite:  Fair  Current Medications: Current Facility-Administered Medications  Medication Dose Route Frequency Provider Last Rate Last Dose  . acetaminophen (TYLENOL) tablet 650 mg  650 mg Oral Q4H PRN Patrecia Pour, NP      . benztropine (COGENTIN) tablet 0.5 mg  0.5 mg Oral BID Patrecia Pour, NP   0.5 mg at 03/12/16 0811  . divalproex (DEPAKOTE) DR tablet 500 mg  500 mg Oral Daily Patrecia Pour, NP   500 mg at 03/12/16 0811  . divalproex (DEPAKOTE) DR tablet 750 mg  750 mg Oral QPM Patrecia Pour, NP   750 mg at 03/11/16 1724  . hydrOXYzine (ATARAX/VISTARIL) tablet 25 mg  25 mg Oral Q6H PRN Patrecia Pour, NP      . magnesium hydroxide (MILK OF MAGNESIA) suspension 30 mL  30 mL Oral  Daily PRN Patrecia Pour, NP      . QUEtiapine (SEROQUEL XR) 24 hr tablet 500 mg  500 mg Oral QHS Patrecia Pour, NP   500 mg at 03/11/16 2205  . traZODone (DESYREL) tablet 50 mg  50 mg Oral QHS,MR X 1 Patrecia Pour, NP   50 mg at 03/11/16 2205    Lab Results:  Results for orders placed or performed during the hospital encounter of 03/10/16 (from the past 48 hour(s))  Comprehensive metabolic panel     Status: Abnormal   Collection Time: 03/10/16  8:07 PM  Result Value Ref Range   Sodium 138 135 - 145 mmol/L   Potassium 4.2 3.5 - 5.1 mmol/L    Comment: SLIGHT HEMOLYSIS   Chloride 105 101 - 111 mmol/L   CO2 29 22 - 32 mmol/L   Glucose, Bld 100 (H) 65 - 99 mg/dL   BUN 16 6 - 20 mg/dL   Creatinine, Ser 1.40 (H) 0.61 - 1.24 mg/dL   Calcium 9.1 8.9 - 10.3 mg/dL   Total  Protein 7.4 6.5 - 8.1 g/dL   Albumin 3.9 3.5 - 5.0 g/dL   AST 43 (H) 15 - 41 U/L   ALT 48 17 - 63 U/L   Alkaline Phosphatase 47 38 - 126 U/L   Total Bilirubin 1.0 0.3 - 1.2 mg/dL   GFR calc non Af Amer >60 >60 mL/min   GFR calc Af Amer >60 >60 mL/min    Comment: (NOTE) The eGFR has been calculated using the CKD EPI equation. This calculation has not been validated in all clinical situations. eGFR's persistently <60 mL/min signify possible Chronic Kidney Disease.    Anion gap 4 (L) 5 - 15  Ethanol     Status: None   Collection Time: 03/10/16  8:07 PM  Result Value Ref Range   Alcohol, Ethyl (B) <5 <5 mg/dL    Comment:        LOWEST DETECTABLE LIMIT FOR SERUM ALCOHOL IS 5 mg/dL FOR MEDICAL PURPOSES ONLY   cbc     Status: None   Collection Time: 03/10/16  8:07 PM  Result Value Ref Range   WBC 7.7 4.0 - 10.5 K/uL   RBC 4.80 4.22 - 5.81 MIL/uL   Hemoglobin 14.3 13.0 - 17.0 g/dL   HCT 42.8 39.0 - 52.0 %   MCV 89.2 78.0 - 100.0 fL   MCH 29.8 26.0 - 34.0 pg   MCHC 33.4 30.0 - 36.0 g/dL   RDW 13.1 11.5 - 15.5 %   Platelets 208 150 - 400 K/uL  Valproic acid level     Status: Abnormal   Collection Time: 03/10/16  8:07 PM  Result Value Ref Range   Valproic Acid Lvl <10 (L) 50.0 - 100.0 ug/mL    Comment: RESULTS CONFIRMED BY MANUAL DILUTION  Rapid urine drug screen (hospital performed)     Status: None   Collection Time: 03/11/16 12:01 AM  Result Value Ref Range   Opiates NONE DETECTED NONE DETECTED   Cocaine NONE DETECTED NONE DETECTED   Benzodiazepines NONE DETECTED NONE DETECTED   Amphetamines NONE DETECTED NONE DETECTED   Tetrahydrocannabinol NONE DETECTED NONE DETECTED   Barbiturates NONE DETECTED NONE DETECTED    Comment:        DRUG SCREEN FOR MEDICAL PURPOSES ONLY.  IF CONFIRMATION IS NEEDED FOR ANY PURPOSE, NOTIFY LAB WITHIN 5 DAYS.        LOWEST DETECTABLE LIMITS FOR URINE DRUG SCREEN Drug Class  Cutoff (ng/mL) Amphetamine      1000 Barbiturate       200 Benzodiazepine   423 Tricyclics       536 Opiates          300 Cocaine          300 THC              50     Blood Alcohol level:  Lab Results  Component Value Date   ETH <5 03/10/2016   ETH <5 14/43/1540    Metabolic Disorder Labs: Lab Results  Component Value Date   HGBA1C 5.9 (H) 11/26/2015   MPG 123 11/26/2015   MPG 111 09/19/2014   No results found for: PROLACTIN Lab Results  Component Value Date   CHOL 240 (H) 11/26/2015   TRIG 149 11/26/2015   HDL 41 11/26/2015   CHOLHDL 5.9 11/26/2015   VLDL 30 11/26/2015   LDLCALC 169 (H) 11/26/2015   LDLCALC 100 (H) 09/19/2014    Physical Findings: AIMS: Facial and Oral Movements Muscles of Facial Expression: None, normal Lips and Perioral Area: None, normal Jaw: None, normal Tongue: None, normal,Extremity Movements Upper (arms, wrists, hands, fingers): None, normal Lower (legs, knees, ankles, toes): None, normal, Trunk Movements Neck, shoulders, hips: None, normal, Overall Severity Severity of abnormal movements (highest score from questions above): None, normal Incapacitation due to abnormal movements: None, normal Patient's awareness of abnormal movements (rate only patient's report): No Awareness, Dental Status Current problems with teeth and/or dentures?: No Does patient usually wear dentures?: No  CIWA:  CIWA-Ar Total: 0 COWS:  COWS Total Score: 0  Musculoskeletal: Strength & Muscle Tone: within normal limits Gait & Station: normal Patient leans: N/A  Psychiatric Specialty Exam: Physical Exam  Nursing note and vitals reviewed.   Review of Systems  Psychiatric/Behavioral: Positive for depression, hallucinations and suicidal ideas. The patient is nervous/anxious.   All other systems reviewed and are negative.   Blood pressure (!) 87/65, pulse (!) 128, temperature 98.7 F (37.1 C), temperature source Oral, resp. rate 18, height 6' 5"  (1.956 m), weight 122.7 kg (270 lb 8 oz), SpO2 99 %.Body mass index  is 32.08 kg/m.  General Appearance: Guarded  Eye Contact:  Poor  Speech:  Slow  Volume:  Decreased  Mood:  Depressed and Dysphoric  Affect:  Flat  Thought Process:  Goal Directed and Descriptions of Associations: Circumstantial  Orientation:  Other:  person, place, situation  Thought Content:  Delusions, Hallucinations: Visual, Paranoid Ideation and Rumination  Suicidal Thoughts:  Yes.  with intent/plancontracts for safety on the unit  Homicidal Thoughts:  No  Memory:  Immediate;   Fair Recent;   Fair Remote;   Poor  Judgement:  Impaired  Insight:  Shallow  Psychomotor Activity:  Decreased  Concentration:  Concentration: Fair and Attention Span: Fair  Recall:  [employer] of Knowledge:  Fair  Language:  Fair  Akathisia:  No  Handed:  Right  AIMS (if indicated):     Assets:  Desire for Improvement  ADL's:  Intact  Cognition:  WNL  Sleep:  Number of Hours: 6.5     Treatment Plan Summary:Timothy [name] is a 19 y.o. AA Male, single , lives in a Recovery house , has a hx of schizoaffective do , who presented to [facility] after worsening depressive sx. Patient continues to have a lot of negative sx- will continue treatment.  Daily contact with patient to assess and evaluate symptoms and progress in treatment and  Medication management Will continue Seroquel 500 mg po qhs for psychosis. Restarted Depakote DR 500 mg po daily and 750 po qpm  for mood sx. Depakote level -03/10/16 - <10 . Will get another level on 03/15/16. Will continue Trazodone 50 mg po qhs for sleep. Will continue to monitor vitals ,medication compliance and treatment side effects while patient is here.  Will monitor for medical issues as well as call consult as needed.  Reviewed labs cbc - wnl, cmp - AST/ALT elevated - likely due to substance abuse, lipid panel - slightly abnormal ( 11/26/15) - recommend diet control , hba1c- 5.9 ( 11/26/15) , tsh - wnl ( 11/26/15) - pending EKG for qtc. CSW will continue working on  disposition.  Patient to participate in therapeutic milieu .   Aimee Timmons, MD 03/12/2016, 10:08 AM

## 2016-03-12 NOTE — Progress Notes (Signed)
Recreation Therapy Notes  Date: 03/12/16 Time: 1000 Location: 500 Hall Dayroom  Group Topic: Stress Management  Goal Area(s) Addresses:  Patient will verbalize importance of using healthy stress management.  Patient will identify positive emotions associated with healthy stress management.   Intervention: Stress Management  Activity :  Deep Breathing, Depression Imagery.  LRT introduced patients to the techniques of deep breathing and guided imagery.  LRT read scripts to allow patients to participate in the techniques.  Patients were to listen and follow along as LRT read scripts.  Education:  Stress Management, Discharge Planning.   Education Outcome: Acknowledges edcuation/In group clarification offered/Needs additional education  Clinical Observations/Feedback:  Pt did not attend group.   Caroll RancherMarjette Kiren Mcisaac, LRT/CTRS    Caroll RancherLindsay, Johnathen Testa A 03/12/2016 11:53 AM

## 2016-03-12 NOTE — [facility] Group Notes (Signed)
Type of Therapy:  Group therapy  Participation Level:  Active  Participation Quality:  Attentive  Affect:  Flat  Cognitive:  Oriented  Insight:  Limited  Engagement in Therapy:  Limited  Modes of Intervention:  Discussion, Socialization  Summary of Progress/Problems:  Chaplain was here to lead a group on themes of hope and courage. Patient stated that hope is courage. Stated that he has both hope and courage and he gets it from praying. Patient stated that he is currently in a faith based program [name] and it is a good fit so far. Stated his mother and grandmother [facility] "[facility]" because he is used to his normal daily routines. Patient is thinking about going back.  Stated he is hopeful to not live in the past, and move forward today. Stated he would like for people to say a prayer for him. Also mentioned he has made some connections while here in the hospital.

## 2016-03-12 NOTE — Tx Team (Signed)
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 acetaminophen (TYLENOL) tablet 650 mg  650 mg Oral Q4H PRN Patrecia Pour, NP      . benztropine (COGENTIN) tablet 0.5 mg  0.5 mg Oral BID Patrecia Pour, NP   0.5 mg at 03/12/16 0811  . divalproex (DEPAKOTE) DR tablet 500 mg  500 mg Oral Daily Patrecia Pour, NP   500 mg at 03/12/16 0811  . divalproex (DEPAKOTE) DR tablet 750 mg  750 mg Oral QPM Patrecia Pour, NP   750 mg at 03/11/16 1724  . hydrOXYzine (ATARAX/VISTARIL) tablet 25 mg  25 mg Oral Q6H PRN Patrecia Pour, NP      . magnesium hydroxide (MILK OF MAGNESIA) suspension 30 mL  30 mL Oral Daily PRN Patrecia Pour, NP      . QUEtiapine (SEROQUEL XR) 24 hr tablet 500 mg  500 mg Oral QHS Patrecia Pour, NP   500 mg at 03/11/16 2205  . traZODone (DESYREL) tablet 50 mg  50 mg Oral QHS,MR X 1 Patrecia Pour, NP   50 mg at 03/11/16 2205    PTA Medications: Prescriptions Prior to Admission  Medication Sig Dispense Refill Last Dose  . benztropine (COGENTIN) 0.5 MG tablet Take 1 tablet (0.5 mg total) by mouth 2 (two) times daily. 60 tablet 0 Past Week at Unknown time  . divalproex (DEPAKOTE) 250 MG DR tablet Take 3 tablets (750 mg total) by mouth every evening. 90 tablet 0 Past Month at Unknown time  . divalproex (DEPAKOTE) 500 MG DR tablet Take 1 tablet (500 mg total) by mouth daily. 30 tablet 0 Past Month at Unknown time  . hydrOXYzine (ATARAX/VISTARIL) 25 MG tablet Take 1 tablet (25 mg total) by  mouth every 6 (six) hours as needed for anxiety. 30 tablet 0 Past Week at Unknown time  . nicotine (NICODERM CQ - DOSED IN MG/24 HOURS) 21 mg/24hr patch Place 1 patch (21 mg total) onto the skin daily. 28 patch 0 Past Month at Unknown time  . QUEtiapine (SEROQUEL XR) 50 MG TB24 24 hr tablet Take 10 tablets (500 mg total) by mouth at bedtime. 300 tablet 0 Past Week at Unknown time  . traZODone (DESYREL) 50 MG tablet Take 1 tablet (50 mg total) by mouth at bedtime and may repeat dose one time if needed. 30 tablet 0 Past Week at Unknown time    Treatment Modalities: Medication Management, Group therapy, Case management,  1 to 1 session with clinician, Psychoeducation, Recreational therapy.   Physician Treatment Plan for Primary Diagnosis: Schizoaffective disorder, bipolar type ([employer]) Long Term Goal(s): Improvement in symptoms so as ready for discharge  Short Term Goals: Ability to identify and develop effective coping behaviors will improve  Medication Management: Evaluate patient's response, side effects, and tolerance of medication regimen.  Therapeutic Interventions: 1 to 1 sessions, Unit Group sessions and Medication administration.  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  Therapeutic Interventions: 1 to 1 sessions, Unit Group sessions and Medication administration.  Evaluation of Outcomes: Not Met   RN Treatment Plan for Primary Diagnosis: Schizoaffective disorder, bipolar type ([place]) Long Term Goal(s):  Knowledge of disease and therapeutic regimen to maintain health will improve  Short Term Goals: Ability to verbalize feelings will improve and Ability to disclose and discuss suicidal ideas  Medication Management: RN will administer medications as ordered by provider, will assess and evaluate patient's response and provide education to patient for prescribed medication. RN will report any adverse and/or side effects to prescribing provider.  Therapeutic Interventions: 1 on 1 counseling sessions, Psychoeducation, Medication administration, Evaluate responses to treatment, Monitor vital signs and CBGs as ordered, Perform/monitor CIWA, COWS, AIMS and Fall Risk screenings as ordered, Perform wound care treatments as ordered.  Evaluation of Outcomes: Not Met   LCSW Treatment Plan for Primary Diagnosis: Schizoaffective disorder, bipolar type ([place]) Long Term Goal(s): Safe transition to appropriate next level of care at discharge, Engage patient in therapeutic group addressing interpersonal concerns.  Short Term Goals: Engage patient in aftercare planning with referrals and resources and Identify triggers associated with mental health/substance abuse issues  Therapeutic Interventions: Assess for all discharge needs, 1 to 1 time with Social worker, Explore available resources and support systems, Assess for adequacy in community support network, Educate family and significant other(s) on suicide prevention, Complete Psychosocial Assessment, Interpersonal group therapy.  Evaluation of Outcomes: Not Met   Progress in Treatment: Attending groups: Yes Participating in groups: Yes Taking medication as prescribed: Yes, MD continues to assess for medication changes as needed Toleration medication: Yes, no side effects reported at this time Family/Significant other contact made: No, not attempted as of yet.  Patient understands diagnosis: Yes  Discussing patient identified problems/goals with staff:  Yes Medical problems stabilized or resolved: Yes Denies suicidal/homicidal ideation: No, patient reports SI/HI. Issues/concerns per patient self-inventory: None Other: N/A  New problem(s) identified: None identified at this time.   New Short Term/Long Term Goal(s): None identified at this time.   Discharge Plan or Barriers: Return to [facility] and follow up outpatient.   Reason for Continuation of Hospitalization: Anxiety Depression Hallucinations Homicidal ideations Suicidal ideation  Estimated Length of Stay: 3-5 days  Attendees: Patient: 03/12/2016  10:34 AM  Physician: Dr. Shea Evans 03/12/2016  10:34 AM  Nursing: Edd Arbour 03/12/2016  10:34 AM  RN Care Manager: Lars Pinks 03/12/2016  10:34 AM  Social Worker: Ripley Fraise, LCSW 03/12/2016  10:34 AM  Recreational Therapist: Winfield Cunas 03/12/2016  10:34 AM  Other: Radonna Ricker, Social Work Intern  03/12/2016  10:34 AM  Other:  03/12/2016  10:34 AM  Other: 03/12/2016  10:34 AM    Scribe for Treatment Team: Radonna Ricker, Social Work Intern 03/12/2016 10:34 AM

## 2016-03-12 NOTE — Progress Notes (Signed)
Patient ID: Timothy [name], male   DOB: 10/13/1995, 20 y.o.   MRN: [id number] D: Client sleep this shift, stays in room. Client barely interacts with Clinical research associatewriter, appears to be preoccupied with internal stimuli. A: Writer provided emotional support, reviewed medications, administered as ordered. Staff will monitor q3815min for safety. R: Client is safe on the unit, did not attend group.

## 2016-03-13 NOTE — Progress Notes (Signed)
Patient ID: Timothy [name], male   DOB: April 19, 1996, 20 y.o.   MRN: [id number] [facility] MD Progress Note  03/13/2016 4:04 PM Timothy [name]  MRN:  [id number]   Subjective: Patient states " I am doing alright with my current medication and has a little anxiety and can feels he can manage without further medication adjustment, minimizes suicidal ideation today."   Objective:Timothy [name] is a 20 y.o. AA Male, single , lives in a Recovery house , has a hx of schizoaffective do , who presented to [facility] after worsening depressive sx.  Patient seen and chart reviewed.Discussed patient with treatment team.  Patient today is seen as flat, withdrawn ,minimizes SI - reports vague plan " whatever he can find." Pt also continues to have VH of faces - although improving.  Per staff - pt often withdrawn to room, flat, isolative and paranoid. Will continue to encourage and support.  Principal Problem: Schizoaffective disorder, bipolar type (HCC) Diagnosis:   Patient Active Problem List   Diagnosis Date Noted  . Schizoaffective disorder, bipolar type (HCC) [F25.0] 09/18/2014  . Tobacco use disorder [F17.200] 09/18/2014  . 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 Cannabis use disorder, severe, dependence (HCC) [F12.20] 09/17/2014   Total Time spent with patient: 25 minutes  Past Psychiatric History: Please see H&P.   Past Medical History:  Past Medical History:  Diagnosis Date  . Cannabis abuse   . Paranoid schizophrenia (HCC)   . Schizoaffective disorder, depressive type (HCC) 09/17/2014   History reviewed. No pertinent surgical history. Family History:  Family History  Problem Relation Age of Onset  . Mental illness Brother   . Drug abuse Brother    Family Psychiatric  History: Please see H&P.  Social History: Please see H&P.  History  Alcohol Use No     History  Drug Use  . Types: Marijuana    Social History   Social History  . Marital status: Single    Spouse  name: N/A  . Number of children: N/A  . Years of education: N/A   Social History Main Topics  . Smoking status: Never Smoker  . Smokeless tobacco: Never Used  . Alcohol use No  . Drug use:     Types: Marijuana  . Sexual activity: No   Other Topics Concern  . None   Social History Narrative  . None   Additional Social History:    Pain Medications: (P) denies  Prescriptions: (P) denies  Over the Counter: (P) denies  History of alcohol / drug use?: (P) Yes Name of Substance 1: (P) THC 1 - Age of First Use: (P) 13 1 - Amount (size/oz): (P) 1/4 oz  1 - Frequency: (P) daily        Sleep: Fair  Appetite:  Fair  Current Medications: Current Facility-Administered Medications  Medication Dose Route Frequency Provider Last Rate Last Dose  . acetaminophen (TYLENOL) tablet 650 mg  650 mg Oral Q4H PRN Charm Rings, NP      . benztropine (COGENTIN) tablet 0.5 mg  0.5 mg Oral BID Charm Rings, NP   0.5 mg at 03/13/16 0911  . divalproex (DEPAKOTE) DR tablet 500 mg  500 mg Oral Daily Charm Rings, NP   500 mg at 03/13/16 0910  . divalproex (DEPAKOTE) DR tablet 750 mg  750 mg Oral QPM Charm Rings, NP   750 mg at 03/12/16 1810  . hydrOXYzine (ATARAX/VISTARIL) tablet 25 mg  25 mg Oral Q6H PRN  Charm RingsJamison Y Lord, NP      . magnesium hydroxide (MILK OF MAGNESIA) suspension 30 mL  30 mL Oral Daily PRN Charm RingsJamison Y Lord, NP      . QUEtiapine (SEROQUEL XR) 24 hr tablet 500 mg  500 mg Oral QHS Charm RingsJamison Y Lord, NP   500 mg at 03/12/16 2215  . traZODone (DESYREL) tablet 50 mg  50 mg Oral QHS,MR X 1 Charm RingsJamison Y Lord, NP   50 mg at 03/12/16 2216    Lab Results:  No results found for this or any previous visit (from the past 48 hour(s)).  Blood Alcohol level:  Lab Results  Component Value Date   ETH <5 03/10/2016   ETH <5 11/24/2015    Metabolic Disorder Labs: Lab Results  Component Value Date   HGBA1C 5.9 (H) 11/26/2015   MPG 123 11/26/2015   MPG 111 09/19/2014   No results found  for: PROLACTIN Lab Results  Component Value Date   CHOL 240 (H) 11/26/2015   TRIG 149 11/26/2015   HDL 41 11/26/2015   CHOLHDL 5.9 11/26/2015   VLDL 30 11/26/2015   LDLCALC 169 (H) 11/26/2015   LDLCALC 100 (H) 09/19/2014    Physical Findings: AIMS: Facial and Oral Movements Muscles of Facial Expression: None, normal Lips and Perioral Area: None, normal Jaw: None, normal Tongue: None, normal,Extremity Movements Upper (arms, wrists, hands, fingers): None, normal Lower (legs, knees, ankles, toes): None, normal, Trunk Movements Neck, shoulders, hips: None, normal, Overall Severity Severity of abnormal movements (highest score from questions above): None, normal Incapacitation due to abnormal movements: None, normal Patient's awareness of abnormal movements (rate only patient's report): No Awareness, Dental Status Current problems with teeth and/or dentures?: No Does patient usually wear dentures?: No  CIWA:  CIWA-Ar Total: 0 COWS:  COWS Total Score: 0  Musculoskeletal: Strength & Muscle Tone: within normal limits Gait & Station: normal Patient leans: N/A  Psychiatric Specialty Exam: Physical Exam  Nursing note and vitals reviewed.   Review of Systems  Psychiatric/Behavioral: Positive for depression, hallucinations and suicidal ideas. The patient is nervous/anxious.   All other systems reviewed and are negative.   Blood pressure (!) 116/57, pulse (!) 102, temperature 97.5 F (36.4 C), temperature source Oral, resp. rate 18, height 6\' 5"  (1.956 m), weight 122.7 kg (270 lb 8 oz), SpO2 99 %.Body mass index is 32.08 kg/m.  General Appearance: Guarded  Eye Contact:  Poor  Speech:  Slow  Volume:  Decreased  Mood:  Depressed and Dysphoric  Affect:  Flat  Thought Process:  Goal Directed and Descriptions of Associations: Circumstantial  Orientation:  Other:  person, place, situation  Thought Content:  Delusions, Hallucinations: Visual, Paranoid Ideation and Rumination   Suicidal Thoughts:  Yes.  with intent/plancontracts for safety on the unit  Homicidal Thoughts:  No  Memory:  Immediate;   Fair Recent;   Fair Remote;   Poor  Judgement:  Impaired  Insight:  Shallow  Psychomotor Activity:  Decreased  Concentration:  Concentration: Fair and Attention Span: Fair  Recall:  [employer]  Fund of Knowledge:  Fair  Language:  Fair  Akathisia:  No  Handed:  Right  AIMS (if indicated):     Assets:  Desire for Improvement  ADL's:  Intact  Cognition:  WNL  Sleep:  Number of Hours: 6.75     Treatment Plan Summary:Timothy [name] is a 20 y.o. AA Male, single , lives in a Recovery house , has a hx of schizoaffective do ,  who presented to [facility] after worsening depressive sx. Patient continues to have a lot of negative sx- will continue treatment.  Daily contact with patient to assess and evaluate symptoms and progress in treatment and Medication management   Will continue Seroquel 500 mg po qhs for psychosis. Continue Depakote DR 500 mg po daily and 750 po qpm  for mood sx. Depakote level -03/10/16 - <10 . Will get another level on 03/15/16. Will continue Trazodone 50 mg po qhs for sleep. Will continue to monitor vitals ,medication compliance and treatment side effects while patient is here.  Will monitor for medical issues as well as call consult as needed.  Reviewed labs cbc - wnl, cmp - AST/ALT elevated - likely due to substance abuse, lipid panel - slightly abnormal ( 11/26/15) - recommend diet control , hba1c- 5.9 ( 11/26/15) , tsh - wnl ( 11/26/15) - pending EKG for qtc. CSW will continue working on disposition.  Patient to participate in therapeutic milieu .   Leata Mouse, MD 03/13/2016, 4:04 PM

## 2016-03-13 NOTE — Progress Notes (Signed)
Patient has denied SI, HI and AVH this shift.   Patient has a flat affect and has been room stuck. Patient was encouraged out of bed but has only been up for meals and medications. Patient has had no incidents of behavorial dyscontrol.   Assess patient for safety, offer medications as prescribed, engage patient in 1:1 staff talks.   Continue to monitor for safety, offer medications as prescribed.

## 2016-03-13 NOTE — [facility] Group Notes (Signed)
[facility] Group Notes: (Clinical Social Work)   03/13/2016      Type of Therapy:  Group Therapy   Participation Level:  Did Not Attend despite MHT prompting   Ambrose MantleMareida Grossman-Orr, LCSW 03/13/2016, 12:32 PM

## 2016-03-13 NOTE — Progress Notes (Signed)
Did not attend group 

## 2016-03-14 NOTE — [facility] Group Notes (Signed)
[facility] Group Notes: (Clinical Social Work)   03/14/2016      Type of Therapy:  Group Therapy   Participation Level:  Did Not Attend despite MHT prompting   Ambrose MantleMareida Grossman-Orr, LCSW 03/14/2016, 12:17 PM

## 2016-03-14 NOTE — Progress Notes (Signed)
[facility] MD Progress Note  03/14/2016 3:40 PM Timothy [name]  MRN:  [id number]   Subjective: Patient states " I am alright today with my current medication and has a little anxiety and may need pain medication for shoulder pain".  Objective: Timothy [name] is a 20 y.o. AA Male, single , lives in a Recovery house , has a hx of schizoaffective do , who presented to [facility] after worsening depressive sx.  Patient seen and chart reviewed. Discussed patient with treatment team.  Patient today is seen as flat, withdrawn ,denied SI and homicide ideation, intention or plan." Pt also continues to have VH of faces - although improving with treatment.denied disturbance of sleep and appetite. He is compliance with medication and denied side effects.   Per staff - Patient withdrawn and isolative to his room this shift. Pt with avertive eyes and minimal interaction with this Clinical research associate on assessment. Pt with dull, flat affect endorsing depression, but pt able to verbally contract for safety and denies suicidal ideations or plans to harm himself. Pt remains in the bed, but did take HS medications without difficulty.Will continue to encourage and support.  Principal Problem: Schizoaffective disorder, bipolar type (HCC) Diagnosis:   Patient Active Problem List   Diagnosis Date Noted  . Schizoaffective disorder, bipolar type (HCC) [F25.0] 09/18/2014  . Tobacco use disorder [F17.200] 09/18/2014  . 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 Cannabis use disorder, severe, dependence (HCC) [F12.20] 09/17/2014   Total Time spent with patient: 25 minutes  Past Psychiatric History: Please see H&P.   Past Medical History:  Past Medical History:  Diagnosis Date  . Cannabis abuse   . Paranoid schizophrenia (HCC)   . Schizoaffective disorder, depressive type (HCC) 09/17/2014   History reviewed. No pertinent surgical history. Family History:  Family History  Problem Relation Age of Onset  . Mental  illness Brother   . Drug abuse Brother    Family Psychiatric  History: Please see H&P.  Social History: Please see H&P.  History  Alcohol Use No     History  Drug Use  . Types: Marijuana    Social History   Social History  . Marital status: Single    Spouse name: N/A  . Number of children: N/A  . Years of education: N/A   Social History Main Topics  . Smoking status: Never Smoker  . Smokeless tobacco: Never Used  . Alcohol use No  . Drug use:     Types: Marijuana  . Sexual activity: No   Other Topics Concern  . None   Social History Narrative  . None   Additional Social History:    Pain Medications: (P) denies  Prescriptions: (P) denies  Over the Counter: (P) denies  History of alcohol / drug use?: (P) Yes Name of Substance 1: (P) THC 1 - Age of First Use: (P) 13 1 - Amount (size/oz): (P) 1/4 oz  1 - Frequency: (P) daily        Sleep: Fair  Appetite:  Fair  Current Medications: Current Facility-Administered Medications  Medication Dose Route Frequency Provider Last Rate Last Dose  . acetaminophen (TYLENOL) tablet 650 mg  650 mg Oral Q4H PRN Timothy Rings, NP      . benztropine (COGENTIN) tablet 0.5 mg  0.5 mg Oral BID Timothy Rings, NP   0.5 mg at 03/14/16 0839  . divalproex (DEPAKOTE) DR tablet 500 mg  500 mg Oral Daily Timothy Rings, NP   500 mg  at 03/14/16 0839  . divalproex (DEPAKOTE) DR tablet 750 mg  750 mg Oral QPM Timothy RingsJamison Y Lord, NP   750 mg at 03/13/16 1706  . hydrOXYzine (ATARAX/VISTARIL) tablet 25 mg  25 mg Oral Q6H PRN Timothy RingsJamison Y Lord, NP      . magnesium hydroxide (MILK OF MAGNESIA) suspension 30 mL  30 mL Oral Daily PRN Timothy RingsJamison Y Lord, NP      . QUEtiapine (SEROQUEL XR) 24 hr tablet 500 mg  500 mg Oral QHS Timothy RingsJamison Y Lord, NP   500 mg at 03/13/16 2047  . traZODone (DESYREL) tablet 50 mg  50 mg Oral QHS,MR X 1 Timothy RingsJamison Y Lord, NP   50 mg at 03/13/16 2044    Lab Results:  No results found for this or any previous visit (from the past 48  hour(s)).  Blood Alcohol level:  Lab Results  Component Value Date   ETH <5 03/10/2016   ETH <5 11/24/2015    Metabolic Disorder Labs: Lab Results  Component Value Date   HGBA1C 5.9 (H) 11/26/2015   MPG 123 11/26/2015   MPG 111 09/19/2014   No results found for: PROLACTIN Lab Results  Component Value Date   CHOL 240 (H) 11/26/2015   TRIG 149 11/26/2015   HDL 41 11/26/2015   CHOLHDL 5.9 11/26/2015   VLDL 30 11/26/2015   LDLCALC 169 (H) 11/26/2015   LDLCALC 100 (H) 09/19/2014    Physical Findings: AIMS: Facial and Oral Movements Muscles of Facial Expression: None, normal Lips and Perioral Area: None, normal Jaw: None, normal Tongue: None, normal,Extremity Movements Upper (arms, wrists, hands, fingers): None, normal Lower (legs, knees, ankles, toes): None, normal, Trunk Movements Neck, shoulders, hips: None, normal, Overall Severity Severity of abnormal movements (highest score from questions above): None, normal Incapacitation due to abnormal movements: None, normal Patient's awareness of abnormal movements (rate only patient's report): No Awareness, Dental Status Current problems with teeth and/or dentures?: No Does patient usually wear dentures?: No  CIWA:  CIWA-Ar Total: 0 COWS:  COWS Total Score: 0  Musculoskeletal: Strength & Muscle Tone: within normal limits Gait & Station: normal Patient leans: N/A  Psychiatric Specialty Exam: Physical Exam  Nursing note and vitals reviewed.   Review of Systems  Psychiatric/Behavioral: Positive for depression, hallucinations and suicidal ideas. The patient is nervous/anxious.   All other systems reviewed and are negative.   Blood pressure 117/67, pulse (!) 105, temperature 97.7 F (36.5 C), temperature source Oral, resp. rate 16, height 6\' 5"  (1.956 m), weight 122.7 kg (270 lb 8 oz), SpO2 99 %.Body mass index is 32.08 kg/m.  General Appearance: Guarded  Eye Contact:  Poor  Speech:  Slow  Volume:  Decreased  Mood:   Depressed and Dysphoric  Affect:  Flat  Thought Process:  Goal Directed and Descriptions of Associations: Circumstantial  Orientation:  Other:  person, place, situation  Thought Content:  Delusions, Hallucinations: Visual, Paranoid Ideation and Rumination  Suicidal Thoughts:  Yes.  with intent/plan - contracts for safety on the unit  Homicidal Thoughts:  No  Memory:  Immediate;   Fair Recent;   Fair Remote;   Poor  Judgement:  Impaired  Insight:  Shallow  Psychomotor Activity:  Decreased  Concentration:  Concentration: Fair and Attention Span: Fair  Recall:  [employer]Fair  Fund of Knowledge:  Fair  Language:  Fair  Akathisia:  No  Handed:  Right  AIMS (if indicated):     Assets:  Desire for Improvement  ADL's:  Intact  Cognition:  WNL  Sleep:  Number of Hours: 6.5     Treatment Plan Summary:Timothy [name] is a 20 y.o. AA Male, single , lives in a Recovery house , has a hx of schizoaffective do , who presented to [facility] after worsening depressive sx. Patient continues to have a lot of negative sx- will continue treatment.  Daily contact with patient to assess and evaluate symptoms and progress in treatment and Medication management   Will continue Seroquel 500 mg po qhs for psychosis. Continue Depakote DR 500 mg po daily and 750 po qpm  for mood sx. Depakote level -03/10/16 - <10 . Will get another level on 03/15/16 which is pending. Will continue Trazodone 50 mg po qhs for sleep. Will continue to monitor vitals ,medication compliance and treatment side effects while patient is here.  Will monitor for medical issues as well as call consult as needed.  Reviewed labs cbc - wnl, cmp - AST/ALT elevated - likely due to substance abuse, lipid panel - slightly abnormal ( 11/26/15) - recommend diet control , hba1c- 5.9 ( 11/26/15) , tsh - wnl ( 11/26/15) - pending EKG for qtc. CSW will continue working on disposition.  Patient to participate in therapeutic milieu .   Leata Mouse,  MD 03/14/2016, 3:40 PM

## 2016-03-14 NOTE — Progress Notes (Signed)
[facility] Group Notes:  (Nursing/MHT/Case Management/Adjunct)  Date:  03/14/2016  Time:  9:55 PM  Type of Therapy:  Psychoeducational Skills  Participation Level:  Active  Participation Quality:  Attentive  Affect:  Flat  Cognitive:  Appropriate  Insight:  Good  Engagement in Group:  Engaged  Modes of Intervention:  Education  Summary of Progress/Problems: The patient states that he had a great day overall. He explained that he was able to communicate more with his peers and that he was able to rest as well. In term so the theme of the day, his support system will be comprised of himself, his mother, and grandmother.   Hazle CocaGOODMAN, Tiffini Blacksher S 03/14/2016, 9:55 PM

## 2016-03-14 NOTE — Progress Notes (Signed)
Writer has observed patient up in the dayroom watching tv with no interaction with peers. He is very quiet and has voiced no needs or concerns with Clinical research associatewriter. He is calm and cooperative. He was compliant with his medications. Support givne and safety maintained on unit with 15 min checks.

## 2016-03-14 NOTE — Plan of Care (Signed)
Problem: Medication: Goal: Compliance with prescribed medication regimen will improve Outcome: Progressing Patient was compliant with his scheduled medications.   

## 2016-03-14 NOTE — Progress Notes (Signed)
Timothy [name] is quiet, avoiding eye contact and slow to respond. He presents to the window. He takes his meds as scheduled . He is bizarre...sits in the dayroom and wtches TV. He does not engage in conversation. A He completed his daily assessment and on it he wrote he deneid SI today and he rated his depression, hopelessness and axnetiy " 9/9/8", respectively. R RHe is safe.He is paranoid and guarded and demonstrates thought blocking.

## 2016-03-14 NOTE — Progress Notes (Signed)
D: Patient withdrawn and isolative to his room this shift. Pt with avertive eyes and minimal interaction with this Clinical research associatewriter on assessment. Pt with dull, flat affect endorsing depression, but pt able to verbally contract for safety and denies suicidal ideations or plans to harm himself. Pt remains in the bed, but did take HS medications without difficulty. A: Q 15 minute safety checks, encourage staff/peer interaction and group participation; administer medications as ordered.  R: No signs or symptoms of distress noted this shift.

## 2016-03-14 NOTE — Progress Notes (Signed)
Patient remains isolative to his room only coming out for meals and medications.  Patient denies SI, HI, and AVH this shift.   Patient has had no incidents of behavioral dyscontrol.   Assess patient for safety, offer medications as prescribed, engage patient in 1:1 staff talks. Encourage patient out of room and to groups.   Patient was able to maintain safety.

## 2016-03-15 LAB — VALPROIC ACID LEVEL: VALPROIC ACID LVL: 96 ug/mL (ref 50.0–100.0)

## 2016-03-15 MED ORDER — QUETIAPINE FUMARATE ER 300 MG PO TB24
600.0000 mg | ORAL_TABLET | Freq: Every day | ORAL | Status: DC
Start: 2016-03-15 — End: 2016-03-18
  Administered 2016-03-15 – 2016-03-17 (×3): 600 mg via ORAL
  Filled 2016-03-15 (×3): qty 2
  Filled 2016-03-15: qty 14
  Filled 2016-03-15: qty 2

## 2016-03-15 NOTE — [facility] Group Notes (Signed)
[facility] LCSW Group Therapy  03/15/2016 1:15 pm  Type of Therapy: Process Group Therapy  Participation Level:  Active  Participation Quality:  Appropriate  Affect:  Flat  Cognitive:  Oriented  Insight:  Improving  Engagement in Group:  Limited  Engagement in Therapy:  Limited  Modes of Intervention:  Activity, Clarification, Education, Problem-solving and Support  Summary of Progress/Problems: Today's group addressed the issue of overcoming obstacles.  Patients were asked to identify their biggest obstacle post d/c that stands in the way of their on-going success, and then problem solve as to how to manage this. Stayed the entire time, checked out for most of it.  Unable to identify any obstacles.  Daryel Geraldorth, Treesa Mccully B 03/15/2016   4:14 PM

## 2016-03-15 NOTE — Progress Notes (Signed)
D-pt c/o high depression and moderate anxiety & hopelessness.  Patient is actively suicidial, MD aware.  Patient c/o constant negative thoughts about himself A-pt takes his medications but did not attend group this am R-cont to monitor for safety

## 2016-03-15 NOTE — Progress Notes (Signed)
[facility] MD Progress Note  03/15/2016 2:44 PM KMARI HALTER  MRN:  [id number] Subjective: Patient states " I am still suicidal and hearing voices.'    Objective:Herndon T Paule is a 20 y.o. AA Male, single , lives in a Recovery house , has a hx of schizoaffective do , who presented to [facility] after worsening depressive sx.  Patient seen and chart reviewed.Discussed patient with treatment team.  Patient today continues to be  seen as flat, withdrawn , continues to have SI -reports he continues to look for a plan. Pt also has VH of faces - reports it as improving.Pt also has AH asking him to hurt self. Per staff - pt often withdrawn to room, flat, isolative and paranoid. Will continue to encourage and support.     Principal Problem: Schizoaffective disorder, bipolar type (HCC) Diagnosis:   Patient Active Problem List   Diagnosis Date Noted  . Schizoaffective disorder, bipolar type (HCC) [F25.0] 09/18/2014  . Tobacco use disorder [F17.200] 09/18/2014  . 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 Cannabis use disorder, severe, dependence (HCC) [F12.20] 09/17/2014   Total Time spent with patient: 25 minutes  Past Psychiatric History: Please see H&P.   Past Medical History:  Past Medical History:  Diagnosis Date  . Cannabis abuse   . Paranoid schizophrenia (HCC)   . Schizoaffective disorder, depressive type (HCC) 09/17/2014   History reviewed. No pertinent surgical history. Family History:  Family History  Problem Relation Age of Onset  . Mental illness Brother   . Drug abuse Brother    Family Psychiatric  History: Please see H&P.  Social History: Please see H&P.  History  Alcohol Use No     History  Drug Use  . Types: Marijuana    Social History   Social History  . Marital status: Single    Spouse name: N/A  . Number of children: N/A  . Years of education: N/A   Social History Main Topics  . Smoking status: Never Smoker  . Smokeless tobacco:  Never Used  . Alcohol use No  . Drug use:     Types: Marijuana  . Sexual activity: No   Other Topics Concern  . None   Social History Narrative  . None   Additional Social History:    Pain Medications: (P) denies  Prescriptions: (P) denies  Over the Counter: (P) denies  History of alcohol / drug use?: (P) Yes Name of Substance 1: (P) THC 1 - Age of First Use: (P) 13 1 - Amount (size/oz): (P) 1/4 oz  1 - Frequency: (P) daily                  Sleep: Fair  Appetite:  Fair  Current Medications: Current Facility-Administered Medications  Medication Dose Route Frequency Provider Last Rate Last Dose  . acetaminophen (TYLENOL) tablet 650 mg  650 mg Oral Q4H PRN Charm Rings, NP   650 mg at 03/15/16 4098  . benztropine (COGENTIN) tablet 0.5 mg  0.5 mg Oral BID Charm Rings, NP   0.5 mg at 03/15/16 0819  . divalproex (DEPAKOTE) DR tablet 500 mg  500 mg Oral Daily Charm Rings, NP   500 mg at 03/14/16 0839  . divalproex (DEPAKOTE) DR tablet 750 mg  750 mg Oral QPM Charm Rings, NP   750 mg at 03/14/16 2201  . hydrOXYzine (ATARAX/VISTARIL) tablet 25 mg  25 mg Oral Q6H PRN Charm Rings, NP      .  magnesium hydroxide (MILK OF MAGNESIA) suspension 30 mL  30 mL Oral Daily PRN Charm RingsJamison Y Lord, NP      . QUEtiapine (SEROQUEL XR) 24 hr tablet 600 mg  600 mg Oral QHS Jomarie LongsSaramma Jalal Rauch, MD      . traZODone (DESYREL) tablet 50 mg  50 mg Oral QHS,MR X 1 Charm RingsJamison Y Lord, NP   50 mg at 03/14/16 2159    Lab Results:  Results for orders placed or performed during the hospital encounter of 03/11/16 (from the past 48 hour(s))  Valproic acid level     Status: None   Collection Time: 03/15/16  6:00 AM  Result Value Ref Range   Valproic Acid Lvl 96 50.0 - 100.0 ug/mL    Comment: [facility]    Blood Alcohol level:  Lab Results  Component Value Date   [facility] <[facility] 03/10/2016   ETH <5 11/24/2015    Metabolic Disorder Labs: Lab Results  Component Value  Date   HGBA1C 5.9 (H) 11/26/2015   MPG 123 11/26/2015   MPG 111 09/19/2014   No results found for: PROLACTIN Lab Results  Component Value Date   CHOL 240 (H) 11/26/2015   TRIG 149 11/26/2015   HDL 41 11/26/2015   CHOLHDL 5.9 11/26/2015   VLDL 30 11/26/2015   LDLCALC 169 (H) 11/26/2015   LDLCALC 100 (H) 09/19/2014    Physical Findings: AIMS: Facial and Oral Movements Muscles of Facial Expression: None, normal Lips and Perioral Area: None, normal Jaw: None, normal Tongue: None, normal,Extremity Movements Upper (arms, wrists, hands, fingers): None, normal Lower (legs, knees, ankles, toes): None, normal, Trunk Movements Neck, shoulders, hips: None, normal, Overall Severity Severity of abnormal movements (highest score from questions above): None, normal Incapacitation due to abnormal movements: None, normal Patient's awareness of abnormal movements (rate only patient's report): No Awareness, Dental Status Current problems with teeth and/or dentures?: No Does patient usually wear dentures?: No  CIWA:  CIWA-Ar Total: 0 COWS:  COWS Total Score: 0  Musculoskeletal: Strength & Muscle Tone: within normal limits Gait & Station: normal Patient leans: N/A  Psychiatric Specialty Exam: Physical Exam  Nursing note and vitals reviewed.   Review of Systems  Psychiatric/Behavioral: Positive for depression, hallucinations, substance abuse and suicidal ideas. The patient is nervous/anxious.   All other systems reviewed and are negative.   Blood pressure 108/70, pulse 84, temperature 97.7 F (36.5 C), temperature source Oral, resp. rate 16, height 6\' 5"  (1.956 m), weight 122.7 kg (270 lb 8 oz), SpO2 99 %.Body mass index is 32.08 kg/m.  General Appearance: Guarded  Eye Contact:  Poor  Speech:  Slow  Volume:  Decreased  Mood:  Depressed and Dysphoric  Affect:  Flat  Thought Process:  Goal Directed and Descriptions of Associations: Circumstantial  Orientation:  Other:  person, place,  situation  Thought Content:  Delusions, Hallucinations: Auditory Command:  hurt self Visual, Paranoid Ideation and Rumination  Suicidal Thoughts:  Yes.  with intent/plancontracts for safety on the unit  Homicidal Thoughts:  No  Memory:  Immediate;   Fair Recent;   Fair Remote;   Poor  Judgement:  Impaired  Insight:  Shallow  Psychomotor Activity:  Decreased  Concentration:  Concentration: Fair and Attention Span: Fair  Recall:  [employer]Fair  Fund of Knowledge:  Fair  Language:  Fair  Akathisia:  No  Handed:  Right  AIMS (if indicated):     Assets:  Desire for Improvement  ADL's:  Intact  Cognition:  WNL  Sleep:  Number of Hours: 6.5     Treatment Plan Summary:Macgregor T Carneiro is a 20 y.o. AA Male, single , lives in a Recovery house , has a hx of schizoaffective do , who presented to [facility] after worsening depressive sx. Patient continues to have a lot of negative sx- will continue treatment.  Daily contact with patient to assess and evaluate symptoms and progress in treatment and Medication management Will increase Seroquel to 600 mg po qhs for psychosis. Restarted Depakote DR 500 mg po daily and 750 po qpm  for mood sx. Depakote level -03/10/16 - <10 . Repeat level on 03/15/16- 96 ( therapuetic) Will continue Trazodone 50 mg po qhs for sleep. Will continue to monitor vitals ,medication compliance and treatment side effects while patient is here.  Will monitor for medical issues as well as call consult as needed.  Reviewed labs cbc - wnl, cmp - AST/ALT elevated - likely due to substance abuse, lipid panel - slightly abnormal ( 11/26/15) - recommend diet control , hba1c- 5.9 ( 11/26/15) , tsh - wnl ( 11/26/15) - pending EKG for qtc. CSW will continue working on disposition.  Patient to participate in therapeutic milieu .   Iyla Balzarini, MD 03/15/2016, 2:44 PM

## 2016-03-15 NOTE — Progress Notes (Signed)
Psychoeducational Group Note  Date:  03/15/2016 Time:  2143  Group Topic/Focus:  Wrap-Up Group:   The focus of this group is to help patients review their daily goal of treatment and discuss progress on daily workbooks.   Participation Level: Did Not Attend  Participation Quality:  Not Applicable  Affect:  Not Applicable  Cognitive:  Not Applicable  Insight:  Not Applicable  Engagement in Group: Not Applicable  Additional Comments: The patient did not attend group this evening.    Hazle CocaGOODMAN, Xayne Brumbaugh S 03/15/2016, 9:42 PM

## 2016-03-15 NOTE — Progress Notes (Signed)
Recreation Therapy Notes  Date: 03/15/16 Time: 1000 Location: 500 Hall Dayroom  Group Topic: Communication  Goal Area(s) Addresses:  Patient will effectively communicate with peers in group.  Patient will verbalize benefit of healthy communication. Patient will verbalize positive effect of healthy communication on post d/c goals.  Patient will identify communication techniques that made activity effective for group.   Intervention:  Dry erase board, eraser, dry erase marker, strips of paper with random words   Activity: Pictionary.  LRT are divided the group into two teams.  Each person would get a turn.  Each person would draw a strip of paer from the container.  The person has to draw whatever is on the paper,  on the board.  While they are drawing, their team is trying to guess what it is they are drawing.  If the team guesses the picture they get a point.     Education:Communication, Discharge Planning  Education Outcome: Needs additional education.   Clinical Observations/Feedback: Pt did not attend group.   Marleah Beever, LRT/CTRS         Matteus Mcnelly A 03/15/2016 12:09 PM 

## 2016-03-16 NOTE — Progress Notes (Signed)
D: Pt visible in milieu at intervals during shift. Present with flat affect and depressed mood on initial contact; brightened up during conversations and as shift progressed. Denies SI, HI, AVH and pain. Reported good night sleep, good appetite, normal energy and good concentration level on self inventory sheet. Rated his depression 5/10, hopelessness 4/10 and anxiety 7/10. Per pt "I just need my medicines to be right". A: Scheduled medications administered as prescribed. Availability and encouragement provided to pt. Q 15 minutes safety checks continues without self harm gestures to report at this time. R: Pt receptive to care. Compliant with medications as ordered. Denies adverse drug reactions. Tolerated all PO intake well. Attended scheduled groups. Remains safe on and off unit.

## 2016-03-16 NOTE — [facility] Group Notes (Signed)
[facility] LCSW Group Therapy  03/16/2016 1:32 PM   Type of Therapy:  Group Therapy  Participation Level:  Active  Participation Quality:  Attentive  Affect:  Appropriate  Cognitive:  Appropriate  Insight:  Improving  Engagement in Therapy:  Engaged  Modes of Intervention:  Clarification, Education, Exploration and Socialization  Summary of Progress/Problems: Today's group focused on resilience. Stayed the entire time, engaged throughout with minimal input.  Smiled at the beginning, which everyone noticed and commented on.  Stated he is being resilient now "because I was working, doing OK and then I relapsed and hit rock bottom."  Cited his family [mother, grandmother, sister and her boyfriend] as supports that help him presevere, and also his faith.  Timothy [name], Timothy [name] 03/16/2016 , 1:32 PM

## 2016-03-16 NOTE — Progress Notes (Signed)
D: Pt  C/O  SI//AVH. Pt is pleasant and cooperative. Pt goal for today is to work on taking things day by day. A: Pt was offered support and encouragement. Pt was given scheduled medications. Pt was encourage to attend groups. Q 15 minute checks were done for safety.  R: Pt is taking medication. Pt has no complaints.Pt receptive to treatment and safety maintained on unit.

## 2016-03-16 NOTE — Progress Notes (Signed)
[facility] MD Progress Note  03/16/2016 2:09 PM Timothy [name]  MRN:  [id number] Subjective: Patient states " I am still depressed."     Objective:Timothy [name] is a 20 y.o. AA Male, single , lives in a Recovery house , has a hx of schizoaffective do , who presented to [facility] after worsening depressive sx.  Patient seen and chart reviewed.Discussed patient with treatment team.  Patient today continues to be  seen as flat, withdrawn , however reports AH as improving. Pt also has VH of faces - reports it as improving. Per staff - pt often withdrawn to room, flat, isolative and paranoid. Will continue to encourage and support.     Principal Problem: Schizoaffective disorder, bipolar type (HCC) Diagnosis:   Patient Active Problem List   Diagnosis Date Noted  . Schizoaffective disorder, bipolar type (HCC) [F25.0] 09/18/2014  . Tobacco use disorder [F17.200] 09/18/2014  . 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 Cannabis use disorder, severe, dependence (HCC) [F12.20] 09/17/2014   Total Time spent with patient: 25 minutes  Past Psychiatric History: Please see H&P.   Past Medical History:  Past Medical History:  Diagnosis Date  . Cannabis abuse   . Paranoid schizophrenia (HCC)   . Schizoaffective disorder, depressive type (HCC) 09/17/2014   History reviewed. No pertinent surgical history. Family History:  Family History  Problem Relation Age of Onset  . Mental illness Brother   . Drug abuse Brother    Family Psychiatric  History: Please see H&P.  Social History: Please see H&P.  History  Alcohol Use No     History  Drug Use  . Types: Marijuana    Social History   Social History  . Marital status: Single    Spouse name: N/A  . Number of children: N/A  . Years of education: N/A   Social History Main Topics  . Smoking status: Never Smoker  . Smokeless tobacco: Never Used  . Alcohol use No  . Drug use:     Types: Marijuana  . Sexual activity: No    Other Topics Concern  . None   Social History Narrative  . None   Additional Social History:    Pain Medications: (P) denies  Prescriptions: (P) denies  Over the Counter: (P) denies  History of alcohol / drug use?: (P) Yes Name of Substance 1: (P) THC 1 - Age of First Use: (P) 13 1 - Amount (size/oz): (P) 1/4 oz  1 - Frequency: (P) daily                  Sleep: Fair  Appetite:  Fair  Current Medications: Current Facility-Administered Medications  Medication Dose Route Frequency Provider Last Rate Last Dose  . acetaminophen (TYLENOL) tablet 650 mg  650 mg Oral Q4H PRN Charm Rings, NP   650 mg at 03/15/16 4098  . benztropine (COGENTIN) tablet 0.5 mg  0.5 mg Oral BID Charm Rings, NP   0.5 mg at 03/16/16 0831  . divalproex (DEPAKOTE) DR tablet 500 mg  500 mg Oral Daily Charm Rings, NP   500 mg at 03/16/16 0831  . divalproex (DEPAKOTE) DR tablet 750 mg  750 mg Oral QPM Charm Rings, NP   750 mg at 03/15/16 1658  . hydrOXYzine (ATARAX/VISTARIL) tablet 25 mg  25 mg Oral Q6H PRN Charm Rings, NP      . magnesium hydroxide (MILK OF MAGNESIA) suspension 30 mL  30 mL Oral Daily PRN Herminio Heads  Lord, NP      . QUEtiapine (SEROQUEL XR) 24 hr tablet 600 mg  600 mg Oral QHS Jomarie LongsSaramma Oletha Tolson, MD   600 mg at 03/15/16 2113  . traZODone (DESYREL) tablet 50 mg  50 mg Oral QHS,MR X 1 Charm RingsJamison Y Lord, NP   50 mg at 03/15/16 2113    Lab Results:  Results for orders placed or performed during the hospital encounter of 03/11/16 (from the past 48 hour(s))  Valproic acid level     Status: None   Collection Time: 03/15/16  6:00 AM  Result Value Ref Range   Valproic Acid Lvl 96 50.0 - 100.0 ug/mL    Comment[facility]    Blood Alcohol level:  Lab Results  Component Value Date   [facility] 03/10/2016   ETH <5 11/24/2015    Metabolic Disorder Labs: Lab Results  Component Value Date   HGBA1C 5.9 (H) 11/26/2015   MPG 123 11/26/2015   MPG 111  09/19/2014   No results found for: PROLACTIN Lab Results  Component Value Date   CHOL 240 (H) 11/26/2015   TRIG 149 11/26/2015   HDL 41 11/26/2015   CHOLHDL 5.9 11/26/2015   VLDL 30 11/26/2015   LDLCALC 169 (H) 11/26/2015   LDLCALC 100 (H) 09/19/2014    Physical Findings: AIMS: Facial and Oral Movements Muscles of Facial Expression: None, normal Lips and Perioral Area: None, normal Jaw: None, normal Tongue: None, normal,Extremity Movements Upper (arms, wrists, hands, fingers): None, normal Lower (legs, knees, ankles, toes): None, normal, Trunk Movements Neck, shoulders, hips: None, normal, Overall Severity Severity of abnormal movements (highest score from questions above): None, normal Incapacitation due to abnormal movements: None, normal Patient's awareness of abnormal movements (rate only patient's report): No Awareness, Dental Status Current problems with teeth and/or dentures?: No Does patient usually wear dentures?: No  CIWA:  CIWA-Ar Total: 0 COWS:  COWS Total Score: 0  Musculoskeletal: Strength & Muscle Tone: within normal limits Gait & Station: normal Patient leans: N/A  Psychiatric Specialty Exam: Physical Exam  Nursing note and vitals reviewed.   Review of Systems  Psychiatric/Behavioral: Positive for depression, hallucinations, substance abuse and suicidal ideas. The patient is nervous/anxious.   All other systems reviewed and are negative.   Blood pressure (!) 150/102, pulse 95, temperature 98.1 F (36.7 C), temperature source Oral, resp. rate 16, height 6\' 5"  (1.956 m), weight 122.7 kg (270 lb 8 oz), SpO2 99 %.Body mass index is 32.08 kg/m.  General Appearance: Guarded  Eye Contact:  Fair  Speech:  Slow  Volume:  Decreased  Mood:  Depressed and Dysphoric  Affect:  Flat  Thought Process:  Goal Directed and Descriptions of Associations: Circumstantial  Orientation:  Other:  person, place, situation  Thought Content:  Delusions, Hallucinations:  Auditory Command:  hurt self Visual, Paranoid Ideation and Rumination improving  Suicidal Thoughts:  No  Homicidal Thoughts:  No  Memory:  Immediate;   Fair Recent;   Fair Remote;   Poor  Judgement:  Impaired  Insight:  Shallow  Psychomotor Activity:  Decreased  Concentration:  Concentration: Fair and Attention Span: Fair  Recall:  [employer]Fair  Fund of Knowledge:  Fair  Language:  Fair  Akathisia:  No  Handed:  Right  AIMS (if indicated):     Assets:  Desire for Improvement  ADL's:  Intact  Cognition:  WNL  Sleep:  Number of Hours: 6     Treatment Plan Summary:Timothy [name] is  a 20 y.o. AA Male, single , lives in a Recovery house , has a hx of schizoaffective do , who presented to [facility] after worsening depressive sx. Patient continues to have a lot of negative sx, although progressing - will continue treatment.  Daily contact with patient to assess and evaluate symptoms and progress in treatment and Medication management Increased Seroquel to 600 mg po qhs for psychosis. Restarted Depakote DR 500 mg po daily and 750 po qpm  for mood sx. Depakote level -03/10/16 - <10 . Repeat level on 03/15/16- 96 ( therapuetic) Will continue Trazodone 50 mg po qhs for sleep. Will continue to monitor vitals ,medication compliance and treatment side effects while patient is here.  Will monitor for medical issues as well as call consult as needed.  Reviewed labs cbc - wnl, cmp - AST/ALT elevated - likely due to substance abuse, lipid panel - slightly abnormal ( 11/26/15) - recommend diet control , hba1c- 5.9 ( 11/26/15) , tsh - wnl ( 11/26/15) - pending EKG for qtc. CSW will continue working on disposition.  Patient to participate in therapeutic milieu .   Samentha Perham, MD 03/16/2016, 2:09 PM

## 2016-03-16 NOTE — Progress Notes (Signed)
Did not attend group 

## 2016-03-16 NOTE — Progress Notes (Signed)
Recreation Therapy Notes  Date: 03/16/16 Time: 1000 Location:  500 Hall Group Room  Group Topic: Self-Esteem  Goal Area(s) Addresses:  Patient will identify the importance of recognizing your uniqueness. Patient will verbalize benefit of increased self-esteem.  Behavioral Response: Engaged  Intervention: Scientist, clinical (histocompatibility and immunogenetics)Construction paper, magazines, scissors, glue sticks, colored pencils  Activity: Advertising.  LRT went over the concept of advertising and the purpose of it.  Patients were to look through the magazines to find pictures or words that describe and highlight the uniqueness about them.  Patients could also use colored pencils to write words or draw pictures if they couldn't find any in the magazines.   Education:  Self-Esteem, Building control surveyorDischarge Planning.   Education Outcome: Acknowledges education/In group clarification offered/Needs additional education  Clinical Observations/Feedback: Pt described some of the things that describe him are his love of football and he likes to eat.  Pt stated the activity was easy because "I know what I like".  Pt stated it was important to identify his uniqueness for dating purposes because he may meet someone who has the same interests as him.   Caroll RancherMarjette [place], LRT/CTRS      Caroll RancherLindsay, Emmylou Bieker A 03/16/2016 11:56 AM

## 2016-03-17 MED ORDER — INFLUENZA VAC SPLIT QUAD 0.5 ML IM SUSY
0.5000 mL | PREFILLED_SYRINGE | Freq: Once | INTRAMUSCULAR | Status: AC
  Administered 2016-03-17: 0.5 mL via INTRAMUSCULAR
  Filled 2016-03-17: qty 0.5

## 2016-03-17 NOTE — [facility] Group Notes (Signed)
[facility] LCSW Group Therapy  03/17/2016 2:27 PM   Type of Therapy:  Group Therapy   Participation Level:  Engaged  Participation Quality:  Attentive  Affect:  Appropriate   Cognitive:  Alert   Insight:  Engaged  Engagement in Therapy:  Improving   Modes of Intervention:  Education, Exploration, Socialization   Summary of Progress/Problems: Timothy [name] was invited to group, chose not to attend.  Timothy [name] from [employer] was here to tell his story of recovery, inform patients about [facility] and play his guitar.   Timothy [name] 03/17/2016 2:27 PM

## 2016-03-17 NOTE — Progress Notes (Signed)
Adult Psychoeducational Group Note  Date:  03/17/2016 Time:  9:14 PM  Group Topic/Focus:  Wrap-Up Group:   The focus of this group is to help patients review their daily goal of treatment and discuss progress on daily workbooks.   Participation Level:  Did Not Attend  Participation Quality:  Did not attend  Affect:  Did not attend  Cognitive:  Did not attend  Insight: None  Engagement in Group:  Did not attend  Modes of Intervention:  Did not attend  Additional Comments:  Patient did not attend wrap up group tonight.  Castor Gittleman L Brylen Wagar 03/17/2016, 9:14 PM

## 2016-03-17 NOTE — Tx Team (Signed)
Interdisciplinary Treatment and Diagnostic Plan Update  03/17/2016 Time of Session: 8:40 AM  Edwena FeltyMarkie T Guse MRN: [id number]  Principal Diagnosis: Schizoaffective disorder, bipolar type (HCC)  Secondary Diagnoses: Principal Problem:   Schizoaffective disorder, bipolar type (HCC) Active Problems:   Cannabis use disorder, severe, dependence (HCC)   Tobacco use disorder   Neuroleptic-induced Parkinsonism (HCC)   Current Medications:  Current Facility-Administered Medications  Medication Dose Route Frequency Provider Last Rate Last Dose  . acetaminophen (TYLENOL) tablet 650 mg  650 mg Oral Q4H PRN Charm RingsJamison Y Lord, NP   650 mg at 03/15/16 [id number]  . benztropine (COGENTIN) tablet 0.5 mg  0.5 mg Oral BID Charm RingsJamison Y Lord, NP   0.5 mg at 03/16/16 1650  . divalproex (DEPAKOTE) DR tablet 500 mg  500 mg Oral Daily Charm RingsJamison Y Lord, NP   500 mg at 03/16/16 0831  . divalproex (DEPAKOTE) DR tablet 750 mg  750 mg Oral QPM Charm RingsJamison Y Lord, NP   750 mg at 03/16/16 1649  . hydrOXYzine (ATARAX/VISTARIL) tablet 25 mg  25 mg Oral Q6H PRN Charm RingsJamison Y Lord, NP      . magnesium hydroxide (MILK OF MAGNESIA) suspension 30 mL  30 mL Oral Daily PRN Charm RingsJamison Y Lord, NP      . QUEtiapine (SEROQUEL XR) 24 hr tablet 600 mg  600 mg Oral QHS Jomarie LongsSaramma Eappen, MD   600 mg at 03/16/16 2256  . traZODone (DESYREL) tablet 50 mg  50 mg Oral QHS,MR X 1 Charm RingsJamison Y Lord, NP   50 mg at 03/16/16 2256    PTA Medications: Prescriptions Prior to Admission  Medication Sig Dispense Refill Last Dose  . benztropine (COGENTIN) 0.5 MG tablet Take 1 tablet (0.5 mg total) by mouth 2 (two) times daily. 60 tablet 0 Past Week at Unknown time  . divalproex (DEPAKOTE) 250 MG DR tablet Take 3 tablets (750 mg total) by mouth every evening. 90 tablet 0 Past Month at Unknown time  . divalproex (DEPAKOTE) 500 MG DR tablet Take 1 tablet (500 mg total) by mouth daily. 30 tablet 0 Past Month at Unknown time  . hydrOXYzine (ATARAX/VISTARIL) 25 MG tablet Take 1 tablet  (25 mg total) by mouth every 6 (six) hours as needed for anxiety. 30 tablet 0 Past Week at Unknown time  . nicotine (NICODERM CQ - DOSED IN MG/24 HOURS) 21 mg/24hr patch Place 1 patch (21 mg total) onto the skin daily. 28 patch 0 Past Month at Unknown time  . QUEtiapine (SEROQUEL XR) 50 MG TB24 24 hr tablet Take 10 tablets (500 mg total) by mouth at bedtime. 300 tablet 0 Past Week at Unknown time  . traZODone (DESYREL) 50 MG tablet Take 1 tablet (50 mg total) by mouth at bedtime and may repeat dose one time if needed. 30 tablet 0 Past Week at Unknown time    Treatment Modalities: Medication Management, Group therapy, Case management,  1 to 1 session with clinician, Psychoeducation, Recreational therapy.   Physician Treatment Plan for Primary Diagnosis: Schizoaffective disorder, bipolar type (HCC) Long Term Goal(s): Improvement in symptoms so as ready for discharge  Short Term Goals: Ability to identify and develop effective coping behaviors will improve  Medication Management: Evaluate patient's response, side effects, and tolerance of medication regimen.  Therapeutic Interventions: 1 to 1 sessions, Unit Group sessions and Medication administration.  Evaluation of Outcomes: Adequate for Discharge  Physician Treatment Plan for Secondary Diagnosis: Principal Problem:   Schizoaffective disorder, bipolar type (HCC) Active Problems:   Cannabis use  disorder, severe, dependence (HCC)   Tobacco use disorder   Neuroleptic-induced Parkinsonism (HCC)   Long Term Goal(s): Improvement in symptoms so as ready for discharge  Short Term Goals: Ability to disclose and discuss suicidal ideas  Medication Management: Evaluate patient's response, side effects, and tolerance of medication regimen.  Therapeutic Interventions: 1 to 1 sessions, Unit Group sessions and Medication administration.  Evaluation of Outcomes: Adequate for Discharge   RN Treatment Plan for Primary Diagnosis: Schizoaffective  disorder, bipolar type (HCC) Long Term Goal(s): Knowledge of disease and therapeutic regimen to maintain health will improve  Short Term Goals: Ability to verbalize feelings will improve and Ability to disclose and discuss suicidal ideas  Medication Management: RN will administer medications as ordered by provider, will assess and evaluate patient's response and provide education to patient for prescribed medication. RN will report any adverse and/or side effects to prescribing provider.  Therapeutic Interventions: 1 on 1 counseling sessions, Psychoeducation, Medication administration, Evaluate responses to treatment, Monitor vital signs and CBGs as ordered, Perform/monitor CIWA, COWS, AIMS and Fall Risk screenings as ordered, Perform wound care treatments as ordered.  Evaluation of Outcomes: Adequate for Discharge   LCSW Treatment Plan for Primary Diagnosis: Schizoaffective disorder, bipolar type (HCC) Long Term Goal(s): Safe transition to appropriate next level of care at discharge, Engage patient in therapeutic group addressing interpersonal concerns.  Short Term Goals: Engage patient in aftercare planning with referrals and resources and Identify triggers associated with mental health/substance abuse issues  Therapeutic Interventions: Assess for all discharge needs, 1 to 1 time with Social worker, Explore available resources and support systems, Assess for adequacy in community support network, Educate family and significant other(s) on suicide prevention, Complete Psychosocial Assessment, Interpersonal group therapy.  Evaluation of Outcomes: Adequate for Discharge   Progress in Treatment: Attending groups: Yes Participating in groups: Yes Taking medication as prescribed: Yes, MD continues to assess for medication changes as needed Toleration medication: Yes, no side effects reported at this time Family/Significant other contact made: No, not attempted as of yet.  Patient understands  diagnosis: Yes  Discussing patient identified problems/goals with staff: Yes Medical problems stabilized or resolved: Yes Denies suicidal/homicidal ideation: No, patient reports SI/HI. Issues/concerns per patient self-inventory: None Other: N/A  New problem(s) identified: None identified at this time.   New Short Term/Long Term Goal(s): None identified at this time.   Discharge Plan or Barriers: Return to [facility] and follow up outpatient.   Reason for Continuation of Hospitalization:   Estimated Length of Stay: Likely d/c tomorrow  Attendees: Patient: 03/17/2016  8:40 AM  Physician: Dr. Elna Breslow 03/17/2016  8:40 AM  Nursing: Christen Bame 03/17/2016  8:40 AM  RN Care Manager: Onnie Boer 03/17/2016  8:40 AM  Social Worker: Richelle Ito, LCSW 03/17/2016  8:40 AM  Recreational Therapist: Aggie Cosier 03/17/2016  8:40 AM  Other: Baldo Daub, Social Work Intern  03/17/2016  8:40 AM  Other:  03/17/2016  8:40 AM  Other: 03/17/2016  8:40 AM    Scribe for Treatment Team: Richelle Ito, LCSW 03/17/2016 8:40 AM

## 2016-03-17 NOTE — Progress Notes (Signed)
Recreation Therapy Notes  Date: 03/17/16 Time: 1000 Location: 500 Hall Dayroom  Group Topic: Coping Skills  Goal Area(s) Addresses:  Patient will be able to identify positive coping skills. Patient will be able to identify how using positive coping skills will help them post d/c.  Behavioral Response: Engaged  Intervention: Worksheet, colored pencils  Activity: OrthoptistWeb Design.  Patients were given a worksheet with a Orthoptistweb design.  Patients were to identify the situations they are facing that have them "stuck" and write them within the lines of the web.  Patients were then asked to come up with coping skills for each of the situations they are facing.  Education: PharmacologistCoping Skills, Building control surveyorDischarge Planning.   Education Outcome: Acknowledges understanding/In group clarification offered/Needs additional education.   Clinical Observations/Feedback: Pt explained he is dealing with stress, anxiety, suicidal thoughts, seeing things and finding a job.  Pt expressed some of his coping skills as washing cars, swimming, cutting grass and yoga.  Pt stated using these coping skills would "keep me on a better path".   Caroll RancherMarjette Majesty Stehlin, LRT/CTRS       Caroll RancherLindsay, Sameul Tagle A 03/17/2016 12:37 PM

## 2016-03-17 NOTE — Progress Notes (Addendum)
[facility]  03/17/2016 1:47 PM Edwena FeltyMarkie T Anglada  MRN:  [id number] Subjective: Patient states " I am OK."      Objective:Roby T Yetta BarreJones is a 20 y.o. AA Male, single , lives in a Recovery house , has a hx of schizoaffective do , [facility]x.  Patient seen and chart reviewed.Discussed patient with treatment team.  Patient today continues to be  seen as flat,although his affect is more reactive . Pt continues to have AH/VH - although improving. Per staff - pt seen as having some sexually inappropriate behavior ,and needs redirection on the unit .Will continue to encourage and support.     Principal Problem: Schizoaffective disorder, bipolar type (HCC) Diagnosis:   Patient Active Problem List   Diagnosis Date Noted  . Schizoaffective disorder, bipolar type (HCC) [F25.0] 09/18/2014  . Tobacco use disorder [F17.200] 09/18/2014  . 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 Cannabis use disorder, severe, dependence (HCC) [F12.20] 09/17/2014   Total Time spent with patient: 25 minutes  Past Psychiatric History: Please see H&P.   Past Medical History:  Past Medical History:  Diagnosis Date  . Cannabis abuse   . Paranoid schizophrenia (HCC)   . Schizoaffective disorder, depressive type (HCC) 09/17/2014   History reviewed. No pertinent surgical history. Family History:  Family History  Problem Relation Age of Onset  . Mental illness Brother   . Drug abuse Brother    Family Psychiatric  History: Please see H&P.  Social History: Please see H&P.  History  Alcohol Use No     History  Drug Use  . Types: Marijuana    Social History   Social History  . Marital status: Single    Spouse name: N/A  . Number of children: N/A  . Years of education: N/A   Social History Main Topics  . Smoking status: Never Smoker  . Smokeless tobacco: Never Used  . Alcohol use No  . Drug use:     Types: Marijuana  . Sexual  activity: No   Other Topics Concern  . None   Social History Narrative  . None   Additional Social History:    Pain Medications: (P) denies  Prescriptions: (P) denies  Over the Counter: (P) denies  History of alcohol / drug use?: (P) Yes Name of Substance 1: (P) THC 1 - Age of First Use: (P) 13 1 - Amount (size/oz): (P) 1/4 oz  1 - Frequency: (P) daily                  Sleep: Fair  Appetite:  Fair  Current Medications: Current Facility-Administered Medications  Medication Dose Route Frequency Provider Last Rate Last Dose  . acetaminophen (TYLENOL) tablet 650 mg  650 mg Oral Q4H PRN Charm RingsJamison Y Lord, NP   650 mg at 03/15/16 [id number]  . benztropine (COGENTIN) tablet 0.5 mg  0.5 mg Oral BID Charm RingsJamison Y Lord, NP   0.5 mg at 03/17/16 0919  . divalproex (DEPAKOTE) DR tablet 500 mg  500 mg Oral Daily Charm RingsJamison Y Lord, NP   500 mg at 03/17/16 0919  . divalproex (DEPAKOTE) DR tablet 750 mg  750 mg Oral QPM Charm RingsJamison Y Lord, NP   750 mg at 03/16/16 1649  . hydrOXYzine (ATARAX/VISTARIL) tablet 25 mg  25 mg Oral Q6H PRN Charm RingsJamison Y Lord, NP      . Influenza vac split quadrivalent PF (FLUARIX) injection 0.5 mL  0.5 mL Intramuscular Once Roise Emert  Rashawn Rayman, MD      . magnesium hydroxide (MILK OF MAGNESIA) suspension 30 mL  30 mL Oral Daily PRN Charm Rings, NP      . QUEtiapine (SEROQUEL XR) 24 hr tablet 600 mg  600 mg Oral QHS Jomarie Longs, MD   600 mg at 03/16/16 2256  . traZODone (DESYREL) tablet 50 mg  50 mg Oral QHS,MR X 1 Charm Rings, NP   50 mg at 03/16/16 2256    Lab Results:  No results found for this or any previous visit (from the past 48 hour(s)).  Blood Alcohol level:  Lab Results  Component Value Date   ETH <5 03/10/2016   ETH <5 11/24/2015    Metabolic Disorder Labs: Lab Results  Component Value Date   HGBA1C 5.9 (H) 11/26/2015   MPG 123 11/26/2015   MPG 111 09/19/2014   No results found for: PROLACTIN Lab Results  Component Value Date   CHOL 240 (H) 11/26/2015    TRIG 149 11/26/2015   HDL 41 11/26/2015   CHOLHDL 5.9 11/26/2015   VLDL 30 11/26/2015   LDLCALC 169 (H) 11/26/2015   LDLCALC 100 (H) 09/19/2014    Physical Findings: AIMS: Facial and Oral Movements Muscles of Facial Expression: None, normal Lips and Perioral Area: None, normal Jaw: None, normal Tongue: None, normal,Extremity Movements Upper (arms, wrists, hands, fingers): None, normal Lower (legs, knees, ankles, toes): None, normal, Trunk Movements Neck, shoulders, hips: None, normal, Overall Severity Severity of abnormal movements (highest score from questions above): None, normal Incapacitation due to abnormal movements: None, normal Patient's awareness of abnormal movements (rate only patient's report): No Awareness, Dental Status Current problems with teeth and/or dentures?: No Does patient usually wear dentures?: No  CIWA:  CIWA-Ar Total: 0 COWS:  COWS Total Score: 0  Musculoskeletal: Strength & Muscle Tone: within normal limits Gait & Station: normal Patient leans: N/A  Psychiatric Specialty Exam: Physical Exam  Nursing note and vitals reviewed.   Review of Systems  Psychiatric/Behavioral: Positive for depression, hallucinations and substance abuse. The patient is nervous/anxious.   All other systems reviewed and are negative.   Blood pressure 114/72, pulse (!) 106, temperature 98.3 F (36.8 C), temperature source Oral, resp. rate 18, height 6\' 5"  (1.956 m), weight 122.7 kg (270 lb 8 oz), SpO2 99 %.Body mass index is 32.08 kg/m.  General Appearance: Guarded  Eye Contact:  Fair  Speech:  Slow  Volume:  Decreased  Mood:  Depressed and Dysphoric improving  Affect:  Flat more reactive  Thought Process:  Goal Directed and Descriptions of Associations: Circumstantial  Orientation:  Other:  person, place, situation  Thought Content:  Delusions, Hallucinations: Auditory Command:  hurt self Visual, Paranoid Ideation and Rumination improving  Suicidal Thoughts:  No   Homicidal Thoughts:  No  Memory:  Immediate;   Fair Recent;   Fair Remote;   Poor  Judgement:  Impaired  Insight:  Shallow  Psychomotor Activity:  Decreased  Concentration:  Concentration: Fair and Attention Span: Fair  Recall:  [employer] of Knowledge:  Fair  Language:  Fair  Akathisia:  No  Handed:  Right  AIMS (if indicated):     Assets:  Desire for Improvement  ADL's:  Intact  Cognition:  WNL  Sleep:  Number of Hours: 9     Treatment Plan Summary:Kailash T Mercer is a 20 y.o. AA Male, single , lives in a Recovery house , has a hx of schizoaffective do , who presented to  [facility] after worsening depressive sx. Patient continues to have a lot of negative sx, although progressing - will continue treatment.  Daily contact with patient to assess and evaluate symptoms and progress in treatment and Medication management Increased Seroquel to 600 mg po qhs for psychosis. Restarted Depakote DR 500 mg po daily and 750 po qpm  for mood sx. Depakote level -03/10/16 - <10 . Repeat level on 03/15/16- 96 ( therapuetic) Will continue Trazodone 50 mg po qhs for sleep. Will continue to monitor vitals ,medication compliance and treatment side effects while patient is here.  Will monitor for medical issues as well as call consult as needed.  Reviewed labs cbc - wnl, cmp - AST/ALT elevated - likely due to substance abuse, lipid panel - slightly abnormal ( 11/26/15) - recommend diet control , hba1c- 5.9 ( 11/26/15) , tsh - wnl ( 11/26/15) - pending EKG for qtc. CSW will continue working on disposition.  Patient to participate in therapeutic milieu .   Rosalin Buster, MD 03/17/2016, 1:47 PM

## 2016-03-17 NOTE — Progress Notes (Signed)
Nursing Note 03/17/2016 [id number]  Data Reports sleeping good with PRN sleep med.  Rates depression 5/10, hopelessness 3/10, and anxiety 7/10. Affect flat mood.  Denied HI, SI, AVH.  C/O shoulder pain which patient states completely got better on its own.  Attending groups.  Napped in the afternoon.  Seen in day room, withdrawn, isolative most of shift.    Action Spoke with patient 1:1, nurse offered support to patient throughout shift.  Continues to be monitored on 15 minute checks for safety.  Response Remains safe and appropriate, though withdrawn and isolative most of day.

## 2016-03-17 NOTE — Progress Notes (Signed)
   D: Pt was laying in bed prior to the assessment. Pt does not engage the writer in conversation other than to answer "yes or no" questions. Pt did not attend wrap up group this evening. Pt has no questions or concerns.    A:  Support and encouragement was offered. 15 min checks continued for safety.  R: Pt remains safe.

## 2016-03-18 MED ORDER — DIVALPROEX SODIUM 500 MG PO DR TAB: 500.0000 mg | DELAYED_RELEASE_TABLET | Freq: Every day | ORAL | 0 refills | Status: DC

## 2016-03-18 MED ORDER — QUETIAPINE FUMARATE ER 300 MG PO TB24: 600.0000 mg | ORAL_TABLET | Freq: Every day | ORAL | 0 refills | Status: DC

## 2016-03-18 MED ORDER — HYDROXYZINE HCL 25 MG PO TABS: 25.0000 mg | ORAL_TABLET | Freq: Four times a day (QID) | ORAL | 0 refills | Status: DC | PRN

## 2016-03-18 MED ORDER — DIVALPROEX SODIUM 250 MG PO DR TAB: 750.0000 mg | DELAYED_RELEASE_TABLET | Freq: Every evening | ORAL | 0 refills | Status: DC

## 2016-03-18 MED ORDER — TRAZODONE HCL 50 MG PO TABS: 50.0000 mg | ORAL_TABLET | Freq: Every evening | ORAL | 0 refills | Status: DC | PRN

## 2016-03-18 MED ORDER — BENZTROPINE MESYLATE 0.5 MG PO TABS: 0.5000 mg | ORAL_TABLET | Freq: Two times a day (BID) | ORAL | 0 refills | Status: DC

## 2016-03-18 NOTE — Progress Notes (Signed)
Nursing Note 03/18/2016 [id number]  Data Reports sleeping good with sleep med.  Rates depression 3/10, hopelessness 5/10, and anxiety 2/10. Affect blunted mood depressed.  Denies HI, SI, AVH.  Received discharge orders.    Action Reviewed medications, discharge instructions, and follow up appointments with patient.  Spoke with patient 1:1, nurse offered support to patient throughout shift.  Medication samples and scripts handed to patient.  Paperwork, AVS, SRA, and transition record handed to patient.   Escorted off of unit at 1313. Belongings returned per belongings form.  Discharged to lobby with bus pass.   Response Verbalized understanding of discharge instructions.  Agrees to contact someone or 911 if she has thoughts/intent to harm self or others.  To follow up per AVS.

## 2016-03-18 NOTE — Plan of Care (Signed)
Problem: [facility] Participation in Recreation Therapeutic Interventions Goal: STG-Patient will demonstrate improved communication skills b STG: Communication - Patient will improve communication skills, as demonstrated by ability to actively participate in at least 2 processing discussion during recreation therapy group sessions by conclusion of recreation therapy tx   Outcome: Completed/Met Date Met: 03/18/16 Pt was able to demonstrate improved communication by participating in the processing of coping skills, communication, leisure education and self-esteem recreation therapy sessions.  Victorino Sparrow, LRT/CTRS

## 2016-03-18 NOTE — Progress Notes (Signed)
  [facility] :  Will you be returning to the same living situation after discharge:  Yes,  [name] At discharge, do you have transportation home?: Yes,  bus [pass Do you have the ability to pay for your medications: Yes,  mental health  Release of information consent forms completed and in the chart;  Patient's signature needed at discharge.  Patient to Follow up at: Follow-up Information    [facility] Follow up on 03/24/2016.   Specialty:  Behavioral Health Why:  Wednesday at 11:00 with Mr Earlie RavelingFred Contact information: [address] [place] [address] [phone]        [facility] .   Specialty:  Professional Counselor Why:  Resume your Monday and Wednesday groups after d/c from the hospital. Contact information: [facility] [address] [place] [address] [phone]           Next level of care provider has access to [facility] Link:no  Safety Planning and Suicide Prevention discussed: Yes,  yes  Have you used any form of tobacco in the last 30 days? (Cigarettes, Smokeless Tobacco, Cigars, and/or Pipes): Yes  Has patient been referred to the Quitline?: Patient refused referral  Patient has been referred for addiction treatment: N/A  Timothy RogueRodney B [name] Timothy [name] 03/18/2016, 10:19 AM

## 2016-03-18 NOTE — [facility] Suicide Risk Assessment (Signed)
[facility]   Principal Problem: Schizoaffective disorder, bipolar type [facility](HCC) Discharge Diagnoses:  Patient Active Problem List   Diagnosis Date Noted  . Schizoaffective disorder, bipolar type (HCC) [F25.0] 09/18/2014  . Tobacco use disorder [F17.200] 09/18/2014  . 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 Cannabis use disorder, severe, dependence (HCC) [F12.20] 09/17/2014    Total Time spent with patient: 30 minutes  Musculoskeletal: Strength & Muscle Tone: within normal limits Gait & Station: normal Patient leans: N/A  Psychiatric Specialty Exam: Review of Systems  Psychiatric/Behavioral: Negative for depression, hallucinations and suicidal ideas.  All other systems reviewed and are negative.   Blood pressure 116/63, pulse (!) 118, temperature 98.7 F (37.1 C), temperature source Oral, resp. rate 18, height 6\' 5"  (1.956 m), weight 122.7 kg (270 lb 8 oz), SpO2 99 %.Body mass index is 32.08 kg/m.  General Appearance: Casual  Eye Contact::  Fair  Speech:  Clear and Coherent409  Volume:  Normal  Mood:  Euthymic  Affect:  Appropriate  Thought Process:  Goal Directed and Descriptions of Associations: Intact  Orientation:  Full (Time, Place, and Person)  Thought Content:  Logical  Suicidal Thoughts:  No  Homicidal Thoughts:  No  Memory:  Immediate;   Fair Recent;   Fair Remote;   Fair  Judgement:  Fair  Insight:  Fair  Psychomotor Activity:  Normal  Concentration:  Fair  Recall:  [employer]Fair  Fund of Knowledge:Fair  Language: Fair  Akathisia:  No  Handed:  Right  AIMS (if indicated):     Assets:  Desire for Improvement  Sleep:  Number of Hours: 9  Cognition: WNL  ADL's:  Intact   Mental Status Per Nursing Assessment::   On Admission:  NA  Demographic Factors:  Male  Loss Factors: NA  Historical Factors: Impulsivity  Risk Reduction Factors:   Positive social support  Continued Clinical Symptoms:   Alcohol/Substance Abuse/Dependencies Previous Psychiatric Diagnoses and Treatments  Cognitive Features That Contribute To Risk:  None    Suicide Risk:  Minimal: No identifiable suicidal ideation.  Patients presenting with no risk factors but with morbid ruminations; may be classified as minimal risk based on the severity of the depressive symptoms    Plan Of Care/Follow-up recommendations:  Activity:  no restrictions Other:  none  Adriena Manfre, MD 03/18/2016, 9:24 AM

## 2016-03-18 NOTE — [facility] Suicide Risk Assessment (Signed)
[facility] INPATIENT:  Family/Significant Other Suicide Prevention Education  Suicide Prevention Education:  Patient Refusal for Family/Significant Other Suicide Prevention Education: The patient Timothy [name] has refused to provide written consent for family/significant other to be provided Family/Significant Other Suicide Prevention Education during admission and/or prior to discharge.  Physician notified.  Ida RogueRodney B Leaner Morici 03/18/2016, 10:16 AM

## 2016-03-18 NOTE — Progress Notes (Signed)
Recreation Therapy Notes  Date: 03/18/16 Time: 1000 Location: 500 Hall Dayroom  Group Topic: Communication, Team Building, Problem Solving  Goal Area(s) Addresses:  Patient will effectively work with peer towards shared goal.  Patient will identify skill used to make activity successful.  Patient will identify how skills used during activity can be used to reach post d/c goals.   Behavioral Response: Engaged  Intervention: STEM Activity   Activity: [place]. Patients were provided the following materials: 5 drinking straws, 5 rubber bands, 5 paper clips, 2 index cards, 2 drinking cups, and 2 toilet paper rolls. Using the provided materials patients were asked to build a launching mechanisms to launch a ping pong ball approximately 12 feet. Patients were divided into teams of 3-5.   Education: Pharmacist, communityocial Skills, Building control surveyorDischarge Planning.   Education Outcome: Acknowledges education/In group clarification offered/Needs additional education.   Clinical Observations/Feedback: Pt took the leadership role with his group.  Pt expressed that the activity wasn't difficult "you had to figure out how you wanted to build it".  Pt was bright, smiling and engaged throughout.  Pt was also assisting his peers.     Caroll RancherMarjette Joane Postel, LRT/CTRS    Caroll RancherLindsay, Luchiano Viscomi A 03/18/2016 11:56 AM
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
## 2016-08-11 ENCOUNTER — Encounter (HOSPITAL_COMMUNITY): Payer: Self-pay | Admitting: *Deleted

## 2016-08-11 ENCOUNTER — Emergency Department (HOSPITAL_COMMUNITY)
Admission: EM | Admit: 2016-08-11 | Discharge: 2016-08-12 | Disposition: A | Discharge status: Other Acute Inpatient Hospital | Payer: Self-pay | Attending: Emergency Medicine | Admitting: Emergency Medicine

## 2016-08-11 DIAGNOSIS — F329 Major depressive disorder, single episode, unspecified: Secondary | ICD-10-CM | POA: Insufficient documentation

## 2016-08-11 DIAGNOSIS — R45851 Suicidal ideations: Secondary | ICD-10-CM

## 2016-08-11 DIAGNOSIS — R45850 Homicidal ideations: Secondary | ICD-10-CM

## 2016-08-11 DIAGNOSIS — Z79899 Other long term (current) drug therapy: Secondary | ICD-10-CM | POA: Insufficient documentation

## 2016-08-11 DIAGNOSIS — R441 Visual hallucinations: Secondary | ICD-10-CM | POA: Insufficient documentation

## 2016-08-11 DIAGNOSIS — F250 Schizoaffective disorder, bipolar type: Secondary | ICD-10-CM | POA: Diagnosis present

## 2016-08-11 LAB — COMPREHENSIVE METABOLIC PANEL
ALBUMIN: 4.1 g/dL (ref 3.5–5.0)
ALT: 27 U/L (ref 17–63)
ANION GAP: 8 (ref 5–15)
AST: 29 U/L (ref 15–41)
Alkaline Phosphatase: 46 U/L (ref 38–126)
BILIRUBIN TOTAL: 0.6 mg/dL (ref 0.3–1.2)
BUN: 9 mg/dL (ref 6–20)
CO2: 25 mmol/L (ref 22–32)
Calcium: 9.2 mg/dL (ref 8.9–10.3)
Chloride: 104 mmol/L (ref 101–111)
Creatinine, Ser: 1.21 mg/dL (ref 0.61–1.24)
GFR calc Af Amer: >60 mL/min (ref >60)
GFR calc non Af Amer: >60 mL/min (ref >60)
GLUCOSE: 104 mg/dL — AB (ref 65–99)
POTASSIUM: 3.9 mmol/L (ref 3.5–5.1)
SODIUM: 137 mmol/L (ref 135–145)
TOTAL PROTEIN: 7.3 g/dL (ref 6.5–8.1)

## 2016-08-11 LAB — ETHANOL: Alcohol, Ethyl (B): <5 mg/dL (ref <5)

## 2016-08-11 LAB — RAPID URINE DRUG SCREEN, HOSP PERFORMED
AMPHETAMINES: NOT DETECTED
BARBITURATES: NOT DETECTED
BENZODIAZEPINES: NOT DETECTED
COCAINE: NOT DETECTED
Opiates: NOT DETECTED
TETRAHYDROCANNABINOL: POSITIVE — AB

## 2016-08-11 LAB — ACETAMINOPHEN LEVEL: Acetaminophen (Tylenol), Serum: <10 ug/mL — ABNORMAL LOW (ref 10–30)

## 2016-08-11 LAB — CBC WITH DIFFERENTIAL/PLATELET
BASOS PCT: 0 %
Basophils Absolute: 0 10*3/uL (ref 0.0–0.1)
Eosinophils Absolute: 0.1 10*3/uL (ref 0.0–0.7)
Eosinophils Relative: 1 %
HEMATOCRIT: 45.7 % (ref 39.0–52.0)
HEMOGLOBIN: 15.4 g/dL (ref 13.0–17.0)
Lymphocytes Relative: 37 %
Lymphs Abs: 2.3 10*3/uL (ref 0.7–4.0)
MCH: 30.2 pg (ref 26.0–34.0)
MCHC: 33.7 g/dL (ref 30.0–36.0)
MCV: 89.6 fL (ref 78.0–100.0)
Monocytes Absolute: 0.9 10*3/uL (ref 0.1–1.0)
Monocytes Relative: 14 %
NEUTROS ABS: 2.9 10*3/uL (ref 1.7–7.7)
NEUTROS PCT: 48 %
Platelets: 217 10*3/uL (ref 150–400)
RBC: 5.1 MIL/uL (ref 4.22–5.81)
RDW: 12.9 % (ref 11.5–15.5)
WBC: 6.1 10*3/uL (ref 4.0–10.5)

## 2016-08-11 LAB — SALICYLATE LEVEL: Salicylate Lvl: <7 mg/dL (ref 2.8–30.0)

## 2016-08-11 MED ORDER — ONDANSETRON HCL 4 MG PO TABS
4.0000 mg | ORAL_TABLET | Freq: Three times a day (TID) | ORAL | Status: DC | PRN
Start: 2016-08-11 — End: 2016-08-12

## 2016-08-11 MED ORDER — LORAZEPAM 1 MG PO TABS: 1.0000 mg | ORAL_TABLET | Freq: Three times a day (TID) | ORAL | Status: DC | PRN

## 2016-08-11 MED ORDER — ACETAMINOPHEN 325 MG PO TABS: 650.0000 mg | ORAL_TABLET | ORAL | Status: DC | PRN

## 2016-08-11 MED ORDER — IBUPROFEN 400 MG PO TABS
600.0000 mg | ORAL_TABLET | Freq: Three times a day (TID) | ORAL | Status: DC | PRN
  Administered 2016-08-11: 600 mg via ORAL
  Filled 2016-08-11: qty 1

## 2016-08-11 MED ORDER — ZOLPIDEM TARTRATE 5 MG PO TABS: 5.0000 mg | ORAL_TABLET | Freq: Every evening | ORAL | Status: DC | PRN

## 2016-08-11 MED ORDER — ALUM & MAG HYDROXIDE-SIMETH 200-200-20 MG/5ML PO SUSP: 30.0000 mL | ORAL | Status: DC | PRN

## 2016-08-11 NOTE — ED Triage Notes (Signed)
Pt reports needing medication refills for his anxiety and depression meds, has been out x 2 months. Denies SI or HI.

## 2016-08-11 NOTE — ED Provider Notes (Signed)
[facility]-EMERGENCY DEPT Provider Note   CSN: [id number] Arrival date & time: 08/11/16  1655  By signing my name below, I, Linna Darner, attest that this documentation has been prepared under the direction and in the presence of Melburn Hake, [place]. Electronically Signed: Linna Darner, Scribe. 08/11/2016. 7:06 PM.  History   Chief Complaint Chief Complaint  Patient presents with  . Medication Refill    The history is provided by the patient and a parent. No language interpreter was used.     HPI Comments: Timothy [name] is a 21 y.o. male with PMHx significant for schizoaffective disorder and paranoid schizophrenia who presents to the Emergency Department complaining of suicidal ideation beginning a few days ago. He reports his SI is a result of social factors. Pt states he has a plan in the form of shooting himself with his grandmother's gun and notes he lives with his grandmother. He also notes some recent homicidal ideation; he states he thought about hurting his cousin but did not act on it. Pt additionally reports some recent visual hallucinations in the form of shadows that he knows are not actually there. Pt states he has been without his anxiety and depression medications for two months; he was seen at [facility] yesterday for the same and was advised to come to the ED for admission in order to receive his medications. Pt states he has recently been abusing Xanax acquired off the street and has also been smoking marijuana and drinking alcohol occasionally. No drug or alcohol use today. He denies auditory hallucinations, pain, or any other complaints at this time.  Past Medical History:  Diagnosis Date  . Cannabis abuse   . Paranoid schizophrenia (HCC)   . Schizoaffective disorder, depressive type (HCC) 09/17/2014    Patient Active Problem List   Diagnosis Date Noted  . Schizoaffective disorder, bipolar type (HCC) 09/18/2014  . Tobacco use disorder 09/18/2014  . Neuroleptic-induced  Parkinsonism (HCC) 09/18/2014  . Cannabis use disorder, severe, dependence (HCC) 09/17/2014    History reviewed. No pertinent surgical history.     Home Medications    Prior to Admission medications   Medication Sig Start Date End Date Taking? Authorizing Provider  benztropine (COGENTIN) 0.5 MG tablet Take 1 tablet (0.5 mg total) by mouth 2 (two) times daily. 03/18/16   Truman Hayward, FNP  divalproex (DEPAKOTE) 250 MG DR tablet Take 3 tablets (750 mg total) by mouth every evening. 03/18/16   Truman Hayward, FNP  divalproex (DEPAKOTE) 500 MG DR tablet Take 1 tablet (500 mg total) by mouth daily. 03/19/16   Truman Hayward, FNP  hydrOXYzine (ATARAX/VISTARIL) 25 MG tablet Take 1 tablet (25 mg total) by mouth every 6 (six) hours as needed for anxiety. 03/18/16   Truman Hayward, FNP  nicotine (NICODERM CQ - DOSED IN MG/24 HOURS) 21 mg/24hr patch Place 1 patch (21 mg total) onto the skin daily. 12/02/15   Adonis Brook, NP  QUEtiapine (SEROQUEL XR) 300 MG 24 hr tablet Take 2 tablets (600 mg total) by mouth at bedtime. 03/18/16   Truman Hayward, FNP  traZODone (DESYREL) 50 MG tablet Take 1 tablet (50 mg total) by mouth at bedtime and may repeat dose one time if needed. 03/18/16   Truman Hayward, FNP    Family History Family History  Problem Relation Age of Onset  . Mental illness Brother   . Drug abuse Brother     Social History Social History  Substance Use Topics  . Smoking  status: Never Smoker  . Smokeless tobacco: Never Used  . Alcohol use No     Allergies   Haldol [haloperidol lactate]; Abilify [aripiprazole]; Penicillins; and Zyprexa [olanzapine]   Review of Systems Review of Systems  Musculoskeletal: Negative for arthralgias and myalgias.  Psychiatric/Behavioral: Positive for dysphoric mood, hallucinations and suicidal ideas.       Positive for homicidal ideas.     Physical Exam Updated Vital Signs BP 126/76 (BP Location: Right Arm)   Pulse 73   Temp 98.7 F  (37.1 C) (Oral)   Resp 18   SpO2 100%   Physical Exam  Constitutional: He is oriented to person, place, and time. He appears well-developed and well-nourished.  HENT:  Head: Normocephalic and atraumatic.  Eyes: Conjunctivae and EOM are normal. Pupils are equal, round, and reactive to light. Right eye exhibits no discharge. Left eye exhibits no discharge. No scleral icterus.  Cardiovascular: Normal rate, regular rhythm, normal heart sounds and intact distal pulses.   Pulmonary/Chest: Effort normal and breath sounds normal. No respiratory distress. He has no wheezes. He has no rales. He exhibits no tenderness.  Abdominal: Soft. Bowel sounds are normal. He exhibits no distension and no mass. There is no tenderness. There is no rebound and no guarding. No hernia.  Musculoskeletal: He exhibits no edema.  Neurological: He is alert and oriented to person, place, and time.  Skin: Skin is warm and dry.  Psychiatric: His speech is delayed. He is slowed and withdrawn. Cognition and memory are normal. He expresses inappropriate judgment. He exhibits a depressed mood. He expresses homicidal and suicidal ideation. He expresses suicidal plans and homicidal plans.  Nursing note and vitals reviewed.    ED Treatments / Results  Labs (all labs ordered are listed, but only abnormal results are displayed) Labs Reviewed  COMPREHENSIVE METABOLIC PANEL - Abnormal; Notable for the following:       Result Value   Glucose, Bld 104 (*)    All other components within normal limits  RAPID URINE DRUG SCREEN, HOSP PERFORMED - Abnormal; Notable for the following:    Tetrahydrocannabinol POSITIVE (*)    All other components within normal limits  ACETAMINOPHEN LEVEL - Abnormal; Notable for the following:    Acetaminophen (Tylenol), Serum <10 (*)    All other components within normal limits  ETHANOL  CBC WITH DIFFERENTIAL/PLATELET  SALICYLATE LEVEL    EKG  EKG Interpretation None       Radiology No  results found.  Procedures Procedures (including critical care time)  DIAGNOSTIC STUDIES: Oxygen Saturation is 100% on RA, normal by my interpretation.    COORDINATION OF CARE: 7:12 PM Discussed treatment plan with pt at bedside and pt agreed to plan.  Medications Ordered in ED Medications - No data to display   Initial Impression / Assessment and Plan / ED Course  I have reviewed the triage vital signs and the nursing notes.  Pertinent labs & imaging results that were available during my care of the patient were reviewed by me and considered in my medical decision making (see chart for details).     Patient presents with SI, HI and visual hallucinations. Reports he has not been taking his psych meds for the past 2 months. VSS. On exam patient with blunt affect, withdrawn and slowed behavior and depressed mood. Remaining exam unremarkable. Labs revealed UDS positive for THC. Remaining labs otherwise unremarkable. Patient medically cleared. Consulted TTS. Behavioral health recommends inpatient tx.  Final Clinical Impressions(s) / ED Diagnoses  Final diagnoses:  Suicidal ideation  Homicidal ideation  Visual hallucination    New Prescriptions New Prescriptions   No medications on file   I personally performed the services described in this documentation, which was scribed in my presence. The recorded information has been reviewed and is accurate.    Satira Sark [place], [place] 08/11/16 [address] [place], [place] 08/12/16 1610    Shaune Pollack, MD 08/12/16 [phone]

## 2016-08-11 NOTE — ED Notes (Signed)
Sitter at bedside.

## 2016-08-11 NOTE — ED Notes (Signed)
Pt moved to a room closer to nurse station. PA made aware of pt comment of suicidal thoughts with plans for overdose.

## 2016-08-12 ENCOUNTER — Inpatient Hospital Stay (HOSPITAL_COMMUNITY)
Admission: AD | Admit: 2016-08-12 | Discharge: 2016-08-20 | DRG: 885 | Disposition: A | Discharge status: Home / Self Care | Payer: Federal, State, Local not specified - Other | Source: Intra-hospital | Attending: Psychiatry | Admitting: Psychiatry

## 2016-08-12 ENCOUNTER — Encounter (HOSPITAL_COMMUNITY): Payer: Self-pay

## 2016-08-12 DIAGNOSIS — F1310 Sedative, hypnotic or anxiolytic abuse, uncomplicated: Secondary | ICD-10-CM | POA: Diagnosis not present

## 2016-08-12 DIAGNOSIS — F17200 Nicotine dependence, unspecified, uncomplicated: Secondary | ICD-10-CM | POA: Diagnosis present

## 2016-08-12 DIAGNOSIS — Z79899 Other long term (current) drug therapy: Secondary | ICD-10-CM

## 2016-08-12 DIAGNOSIS — Z915 Personal history of self-harm: Secondary | ICD-10-CM

## 2016-08-12 DIAGNOSIS — G4700 Insomnia, unspecified: Secondary | ICD-10-CM | POA: Diagnosis present

## 2016-08-12 DIAGNOSIS — Z833 Family history of diabetes mellitus: Secondary | ICD-10-CM

## 2016-08-12 DIAGNOSIS — Z818 Family history of other mental and behavioral disorders: Secondary | ICD-10-CM | POA: Diagnosis not present

## 2016-08-12 DIAGNOSIS — F411 Generalized anxiety disorder: Secondary | ICD-10-CM | POA: Diagnosis present

## 2016-08-12 DIAGNOSIS — Z62810 Personal history of physical and sexual abuse in childhood: Secondary | ICD-10-CM | POA: Diagnosis present

## 2016-08-12 DIAGNOSIS — R45850 Homicidal ideations: Secondary | ICD-10-CM | POA: Diagnosis present

## 2016-08-12 DIAGNOSIS — F1220 Cannabis dependence, uncomplicated: Secondary | ICD-10-CM | POA: Diagnosis present

## 2016-08-12 DIAGNOSIS — G2111 Neuroleptic induced parkinsonism: Secondary | ICD-10-CM | POA: Diagnosis present

## 2016-08-12 DIAGNOSIS — R45851 Suicidal ideations: Secondary | ICD-10-CM | POA: Diagnosis present

## 2016-08-12 DIAGNOSIS — F251 Schizoaffective disorder, depressive type: Secondary | ICD-10-CM | POA: Diagnosis not present

## 2016-08-12 DIAGNOSIS — Z9119 Patient's noncompliance with other medical treatment and regimen: Secondary | ICD-10-CM

## 2016-08-12 DIAGNOSIS — Z813 Family history of other psychoactive substance abuse and dependence: Secondary | ICD-10-CM | POA: Diagnosis not present

## 2016-08-12 DIAGNOSIS — T43595A Adverse effect of other antipsychotics and neuroleptics, initial encounter: Secondary | ICD-10-CM | POA: Diagnosis present

## 2016-08-12 MED ORDER — ACETAMINOPHEN 325 MG PO TABS
650.0000 mg | ORAL_TABLET | Freq: Four times a day (QID) | ORAL | Status: DC | PRN
  Administered 2016-08-13 – 2016-08-14 (×2): 650 mg via ORAL
  Filled 2016-08-12 (×2): qty 2

## 2016-08-12 MED ORDER — ALUM & MAG HYDROXIDE-SIMETH 200-200-20 MG/5ML PO SUSP: 30.0000 mL | ORAL | Status: DC | PRN

## 2016-08-12 MED ORDER — MAGNESIUM HYDROXIDE 400 MG/5ML PO SUSP: 30.0000 mL | Freq: Every day | ORAL | Status: DC | PRN

## 2016-08-12 MED ORDER — TRAZODONE HCL 50 MG PO TABS
50.0000 mg | ORAL_TABLET | Freq: Every evening | ORAL | Status: DC | PRN
  Filled 2016-08-12: qty 1

## 2016-08-12 MED ORDER — HYDROXYZINE HCL 25 MG PO TABS
25.0000 mg | ORAL_TABLET | Freq: Four times a day (QID) | ORAL | Status: DC | PRN
  Filled 2016-08-12: qty 10
  Filled 2016-08-12: qty 1

## 2016-08-12 NOTE — ED Notes (Signed)
Attempted report 

## 2016-08-12 NOTE — Progress Notes (Signed)
Per Karleen HampshireSpencer, [place] meets inpatient criteria Jerzy Roepke K. Sherlon HandingHarris, LCAS-A, LPC-A, [facility]  Counselor 08/12/2016 12:42 AM

## 2016-08-12 NOTE — ED Notes (Signed)
A  Regular Diet was ordered for Lunch. 

## 2016-08-12 NOTE — Tx Team (Addendum)
Initial Treatment Plan 08/12/2016 6:43 PM Timothy FeltyMarkie T Coffelt [id number]    PATIENT STRESSORS: Legal issue Medication change or noncompliance Substance abuse   PATIENT STRENGTHS: Average or above average intelligence General fund of knowledge Physical Health Supportive family/friends   PATIENT IDENTIFIED PROBLEMS: Medication Management  Suicidal thoughts  Depression  Psychosis  "Get better"  "Start a new chapter"           DISCHARGE CRITERIA:  Improved stabilization in mood, thinking, and/or behavior Need for constant or close observation no longer present Reduction of life-threatening or endangering symptoms to within safe limits Safe-care adequate arrangements made  PRELIMINARY DISCHARGE PLAN: Outpatient therapy Return to previous living arrangement  PATIENT/FAMILY INVOLVEMENT: This treatment plan has been presented to and reviewed with the patient, Timothy [name].  The patient and family have been given the opportunity to ask questions and make suggestions.  Areatha Keasraxler, Angela Marie, RN 08/12/2016, 6:43 PM

## 2016-08-12 NOTE — Progress Notes (Signed)
D: Pt passive AVH- contracts for safety, denies SI/HI. Pt is pleasant and cooperative. Pt has been seen interacting sparingly with peers and staff, but pt will talk when engaged. Pt presents with inappropriate smiling at times, but pt has been appropriate this evening.   A: Pt was offered support and encouragement. Pt was given scheduled medications. Pt was encourage to attend groups. Q 15 minute checks were done for safety.   R:Pt attends groups and interacts well with peers and staff. Pt is taking medication. Pt has no complaints at this time .Pt receptive to treatment and safety maintained on unit.

## 2016-08-12 NOTE — ED Notes (Signed)
Breakfast tray ordered 

## 2016-08-12 NOTE — Progress Notes (Signed)
Timothy [name] is a 21 year old male being admitted voluntarily to 78503-2 from [facility].  He came to the ED with complaints of suicidal with a plan to shoot himself with his grandmother's. He also reported homicidal ideation to hurt cousin. He also reported seeing shadows.  He has been without medications for two months.  He was [facility] at [facility] yesterday and they advised him to come to ED for treatment.  He reported that he has been abusing Xanax acquired off the street and has also been smoking marijuana and drinking alcohol occasionally. [facility] similar issues last in 2017. He is diagnosed with Schizoaffective Disorder, Depressive Type.  Oriented him to the unit.  Admission paperwork completed and signed.  Belongings searched and secured in locker # 30.  Skin assessment completed and no skin issues noted.  Q 15 minute checks initiated for safety.  We will monitor the progress towards his goals.

## 2016-08-12 NOTE — Progress Notes (Signed)
D: Patient admitted into 56501-2. Patient is A&Ox4, denies SI/HI currently. States he does hear voices at times and sees things like shadows, currently. Patient went down to cafeteria to eat dinner, is currently in his room and requests to shower. Patient denies pain or problems at this moment. Patient presents with a flat affect, but is pleasant and cooperative. A: Patient offered medications prn. Emotional support and encouragement given as needed. Q15 minute checks for safety began. Patient ate dinner and took a shower. R: Patient remains safe. Patient verbalized understanding to let staff know if he no longer feels safe or if anything changes.   Julicia Krieger, Wyman SongsterAngela Marie, RN

## 2016-08-12 NOTE — Progress Notes (Signed)
LCSW following for disposition. Patient accepted to 503/2 Accepting  MD: E[place]en Report: [phone]  Patient is voluntary. RN will have patient sign and then fax over document. Patient able to arrive once report called and paperwork completed.  Deretha EmoryHannah Belvie Iribe LCSW, MSW Clinical Social Work: Optician, dispensingystem Wide Float Coverage for :  [phone]
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
## 2016-08-13 ENCOUNTER — Encounter (HOSPITAL_COMMUNITY): Payer: Self-pay | Admitting: Psychiatry

## 2016-08-13 DIAGNOSIS — Z818 Family history of other mental and behavioral disorders: Secondary | ICD-10-CM

## 2016-08-13 DIAGNOSIS — F1290 Cannabis use, unspecified, uncomplicated: Secondary | ICD-10-CM

## 2016-08-13 DIAGNOSIS — Z880 Allergy status to penicillin: Secondary | ICD-10-CM

## 2016-08-13 DIAGNOSIS — F1310 Sedative, hypnotic or anxiolytic abuse, uncomplicated: Secondary | ICD-10-CM

## 2016-08-13 DIAGNOSIS — R45850 Homicidal ideations: Secondary | ICD-10-CM

## 2016-08-13 DIAGNOSIS — R45851 Suicidal ideations: Secondary | ICD-10-CM

## 2016-08-13 DIAGNOSIS — F251 Schizoaffective disorder, depressive type: Principal | ICD-10-CM

## 2016-08-13 DIAGNOSIS — Z813 Family history of other psychoactive substance abuse and dependence: Secondary | ICD-10-CM

## 2016-08-13 DIAGNOSIS — Z888 Allergy status to other drugs, medicaments and biological substances status: Secondary | ICD-10-CM

## 2016-08-13 DIAGNOSIS — Z79899 Other long term (current) drug therapy: Secondary | ICD-10-CM

## 2016-08-13 MED ORDER — CHLORDIAZEPOXIDE HCL 25 MG PO CAPS: 25.0000 mg | ORAL_CAPSULE | ORAL | Status: DC | PRN

## 2016-08-13 MED ORDER — QUETIAPINE FUMARATE 400 MG PO TABS
400.0000 mg | ORAL_TABLET | Freq: Every day | ORAL | Status: DC
  Administered 2016-08-13 – 2016-08-15 (×3): 400 mg via ORAL
  Filled 2016-08-13 (×5): qty 1

## 2016-08-13 MED ORDER — DIVALPROEX SODIUM ER 250 MG PO TB24
750.0000 mg | ORAL_TABLET | Freq: Every day | ORAL | Status: DC
  Administered 2016-08-13 – 2016-08-15 (×3): 750 mg via ORAL
  Filled 2016-08-13 (×5): qty 3

## 2016-08-13 MED ORDER — QUETIAPINE FUMARATE 50 MG PO TABS: 50.0000 mg | ORAL_TABLET | Freq: Three times a day (TID) | ORAL | Status: DC | PRN
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
## 2016-08-13 NOTE — Progress Notes (Signed)
Recreation Therapy Notes  Date: 08/13/16 Time: 1000 Location: 500 Hall Dayroom  Group Topic: Communication, Team Building, Problem Solving  Goal Area(s) Addresses:  Patient will effectively work with peer towards shared goal.  Patient will identify skill used to make activity successful.  Patient will identify how skills used during activity can be used to reach post d/c goals.   Behavioral Response: Engaged  Intervention: STEM Activity   Activity: Glass blower/designeripe Cleaner Tower. In teams, patients were asked to build the tallest freestanding tower possible out of 15 pipe cleaners. Systematically resources were removed, for example patient ability to use both hands and patient ability to verbally communicate.    Education:Social Skills, Discharge Planning.   Education Outcome: Acknowledges education/In group clarification offered/Needs additional education.   Clinical Observations/Feedback: Pt stated his group started with a circular base for their tower but then went on to something else.  Pt expressed using the skills from the group help him "keep a positive mind and take things slow so you won't keep going to the bad."  Pt explained that people aggravating him prevents him from using these skills.  Pt stated if he uses these skills moving forward he can "stay on the right track, take his medications and keep his head on straight."     Alyzah Pelly Lillia AbedLindsay, LRT/CTRS        Caroll RancherLindsay, Deaisha Welborn A 08/13/2016 12:37 PM

## 2016-08-13 NOTE — Progress Notes (Signed)
Recreation Therapy Notes  INPATIENT RECREATION THERAPY ASSESSMENT  Patient Details Name: Timothy [name] MRN: [id number] DOB: 12/19/1995 Today's Date: 08/13/2016  Patient Stressors: Family, Other (Comment) (Not getting help; lack of support from family)  Pt stated he was here for suicidal thoughts.  Coping Skills:   Isolate, Substance Abuse, Avoidance, Self-Injury, Exercise, Art/Dance, Talking, Music, Sports  Personal Challenges: Anger, Communication, Concentration, Decision-Making, Expressing Yourself, Problem-Solving, Relationships, Social Interaction, Stress Management, Substance Abuse, Time Management, Trusting Others, Work International aid/development workererformance, Other (Comment)  Leisure Interests (2+):  Exercise - Walking, Individual - TV, Games - [employer]Video games, Sports - Basketball, Technical brewerature - Other (Comment), Individual - Other (Comment) (go to the park; ride bike)  Awareness of Community Resources:  Yes  [employer] Resources:  Library, [employer]  Current Use: Yes  Patient Strengths:  Chief Executive OfficerHard worker; good person to get along with  Patient Identified Areas of Improvement:  Trusting people; self confidence  Current Recreation Participation:  2-3 times [name] week  Patient Goal for Hospitalization:  "Stay on the right track, take medications, do everything right this time"  [place]  [place]idence[place]  Current SI (including self-harm):  No  Current HI:  No  Consent to Intern Participation: N/[name]   Timothy LentMarjette Jamiyah [name],LRT/CTRS  Timothy [name] 08/13/2016, 2:14 PM
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
## 2016-08-13 NOTE — Progress Notes (Signed)
D: Patient's self inventory sheet: patient has fair sleep, did not request sleep medication.fair  Appetite, low energy level, good concentration. Rated depression 7/10, hopeless 5/10, anxiety 6/10. SI/HI/AVH: reports AH of his name being called, and continued SI with ability to contract for safety. Physical complaints are pain in his back and ribs. Goal is "taking things slow and moving forward with life and goals and starting a new chapter in life". Plans to work on "take my meds if needed and I guess go to groups".   A: Medications administered, assessed medication knowledge and education given on medication regimen.  Emotional support and encouragement given patient. R: continues to endorse SI and denies HI , contracts for safety. Safety maintained with 15 minute checks.

## 2016-08-13 NOTE — Progress Notes (Signed)
Adult Psychoeducational Group Note  Date:  08/13/2016 Time:  8:56 PM  Group Topic/Focus:  Wrap-Up Group:   The focus of this group is to help patients review their daily goal of treatment and discuss progress on daily workbooks.  Participation Level:  Active  Participation Quality:  Appropriate  Affect:  Appropriate  Cognitive:  Appropriate  Insight: Appropriate  Engagement in Group:  Engaged  Modes of Intervention:  Discussion  Additional Comments:  The patient expressed that he attended groups.The patient also said he rates today a 8.  Octavio Mannshigpen, Georjean Toya Lee 08/13/2016, 8:56 PM

## 2016-08-13 NOTE — Tx Team (Signed)
Interdisciplinary Treatment and Diagnostic Plan Update  08/13/2016 Time of Session: 11:41 AM  Timothy [name] MRN: [id number]  Principal Diagnosis: Schizoaffective disorder, depressive type ([place])  Secondary Diagnoses: Active Problems:   Schizoaffective disorder, depressive type (HCC)   Current Medications:  Current Facility-Administered Medications  Medication Dose Route Frequency Provider Last Rate Last Dose  . acetaminophen (TYLENOL) tablet 650 mg  650 mg Oral Q6H PRN Ethelene Hal, NP      . alum & mag hydroxide-simeth (MAALOX/MYLANTA) 200-200-20 MG/5ML suspension 30 mL  30 mL Oral Q4H PRN Ethelene Hal, NP      . chlordiazePOXIDE (LIBRIUM) capsule 25 mg  25 mg Oral Q4H PRN Ursula Alert, MD      . divalproex (DEPAKOTE ER) 24 hr tablet 750 mg  750 mg Oral QHS Saramma Eappen, MD      . hydrOXYzine (ATARAX/VISTARIL) tablet 25 mg  25 mg Oral Q6H PRN Ethelene Hal, NP      . magnesium hydroxide (MILK OF MAGNESIA) suspension 30 mL  30 mL Oral Daily PRN Ethelene Hal, NP      . QUEtiapine (SEROQUEL) tablet 400 mg  400 mg Oral QHS Saramma Eappen, MD      . QUEtiapine (SEROQUEL) tablet 50 mg  50 mg Oral TID PRN Ursula Alert, MD      . traZODone (DESYREL) tablet 50 mg  50 mg Oral QHS PRN Ethelene Hal, NP        PTA Medications: Prescriptions Prior to Admission  Medication Sig Dispense Refill Last Dose  . benztropine (COGENTIN) 0.5 MG tablet Take 1 tablet (0.5 mg total) by mouth 2 (two) times daily. (Patient not taking: Reported on 08/12/2016) 60 tablet 0 Not Taking at Unknown time  . divalproex (DEPAKOTE) 250 MG DR tablet Take 3 tablets (750 mg total) by mouth every evening. (Patient not taking: Reported on 08/12/2016) 30 tablet 0 Not Taking at Unknown time  . divalproex (DEPAKOTE) 500 MG DR tablet Take 1 tablet (500 mg total) by mouth daily. (Patient not taking: Reported on 08/12/2016) 30 tablet 0 Not Taking at Unknown time  . hydrOXYzine (ATARAX/VISTARIL) 25  MG tablet Take 1 tablet (25 mg total) by mouth every 6 (six) hours as needed for anxiety. (Patient not taking: Reported on 08/12/2016) 30 tablet 0 Not Taking at Unknown time  . nicotine (NICODERM CQ - DOSED IN MG/24 HOURS) 21 mg/24hr patch Place 1 patch (21 mg total) onto the skin daily. (Patient not taking: Reported on 08/12/2016) 28 patch 0 Not Taking at Unknown time  . QUEtiapine (SEROQUEL XR) 300 MG 24 hr tablet Take 2 tablets (600 mg total) by mouth at bedtime. (Patient not taking: Reported on 08/12/2016) 60 tablet 0 Not Taking at Unknown time  . traZODone (DESYREL) 50 MG tablet Take 1 tablet (50 mg total) by mouth at bedtime and may repeat dose one time if needed. (Patient not taking: Reported on 08/12/2016) 30 tablet 0 Not Taking at Unknown time    Treatment Modalities: Medication Management, Group therapy, Case management,  1 to 1 session with clinician, Psychoeducation, Recreational therapy.   Physician Treatment Plan for Primary Diagnosis: Schizoaffective disorder, depressive type ([place]) Long Term Goal(s): Improvement in symptoms so as ready for discharge  Short Term Goals: Compliance with prescribed medications will improve  Medication Management: Evaluate patient's response, side effects, and tolerance of medication regimen.  Therapeutic Interventions: 1 to 1 sessions, Unit Group sessions and Medication administration.  Evaluation of Outcomes: Not Met  Physician  Treatment Plan for Secondary Diagnosis: Active Problems:   Schizoaffective disorder, depressive type ([place])   Long Term Goal(s): Improvement in symptoms so as ready for discharge  Short Term Goals: Ability to identify triggers associated with substance abuse/mental health issues will improve  Medication Management: Evaluate patient's response, side effects, and tolerance of medication regimen.  Therapeutic Interventions: 1 to 1 sessions, Unit Group sessions and Medication administration.  Evaluation of Outcomes: Not  Met   RN Treatment Plan for Primary Diagnosis: Schizoaffective disorder, depressive type ([place]) Long Term Goal(s): Knowledge of disease and therapeutic regimen to maintain health will improve  Short Term Goals: Ability to disclose and discuss suicidal ideas and Compliance with prescribed medications will improve  Medication Management: RN will administer medications as ordered by provider, will assess and evaluate patient's response and provide education to patient for prescribed medication. RN will report any adverse and/or side effects to prescribing provider.  Therapeutic Interventions: 1 on 1 counseling sessions, Psychoeducation, Medication administration, Evaluate responses to treatment, Monitor vital signs and CBGs as ordered, Perform/monitor CIWA, COWS, AIMS and Fall Risk screenings as ordered, Perform wound care treatments as ordered.  Evaluation of Outcomes: Not Met   LCSW Treatment Plan for Primary Diagnosis: Schizoaffective disorder, depressive type ([place]) Long Term Goal(s): Safe transition to appropriate next level of care at discharge, Engage patient in therapeutic group addressing interpersonal concerns.  Short Term Goals: Engage patient in aftercare planning with referrals and resources and Increase skills for wellness and recovery  Therapeutic Interventions: Assess for all discharge needs, 1 to 1 time with Social worker, Explore available resources and support systems, Assess for adequacy in community support network, Educate family and significant other(s) on suicide prevention, Complete Psychosocial Assessment, Interpersonal group therapy.  Evaluation of Outcomes: Not Met   Progress in Treatment: Attending groups: Yes Participating in groups: Yes Taking medication as prescribed: Yes, MD continues to assess for medication changes as needed Toleration medication: Yes, no side effects reported at this time Family/Significant other contact made: Attempted Patient  understands diagnosis: Limited insight  Discussing patient identified problems/goals with staff: Yes Medical problems stabilized or resolved: Yes Denies suicidal/homicidal ideation: No  Issues/concerns per patient self-inventory: None Other: N/A  New problem(s) identified: None identified at this time.   New Short Term/Long Term Goal(s): None identified at this time.   Discharge Plan or Barriers: Return to [facility] and follow up with [facility]  Reason for Continuation of Hospitalization: Anxiety Depression Hallucinations Medication stabilization Suicidal ideation   Estimated Length of Stay: 3-5 days  Attendees: Patient: 08/13/2016  11:41 AM  Physician: Dr. Shea Evans 08/13/2016  11:41 AM  Nursing: Vladimir Faster.Jerilynn Mages, RN  08/13/2016  11:41 AM  RN Care Manager: Lars Pinks 08/13/2016  11:41 AM  Social Worker: Ripley Fraise, LCSW 08/13/2016  11:41 AM  Recreational Therapist: Winfield Cunas 08/13/2016  11:41 AM  Other: Radonna Ricker, Social Work Intern  08/13/2016  11:41 AM  Other:  08/13/2016  11:41 AM  Other: 08/13/2016  11:41 AM    Scribe for Treatment Team: Radonna Ricker, Social Work Intern 08/13/2016 11:41 AM

## 2016-08-13 NOTE — [facility] Suicide Risk Assessment (Signed)
[facility] INPATIENT:  Family/Significant Other Suicide Prevention Education  Suicide Prevention Education:  Education Completed;Timothy [name] [phone]) has been identified by the patient as the family member/significant other with whom the patient will be residing, and identified as the person(s) who will aid the patient in the event of a mental health crisis (suicidal ideations/suicide attempt).  With written consent from the patient, the family member/significant other has been provided the following suicide prevention education, prior to the and/or following the discharge of the patient.  The suicide prevention education provided includes the following:  Suicide risk factors  Suicide prevention and interventions  National Suicide Hotline telephone number  [facility] assessment telephone number  [facility] Emergency Assistance 911  [facility] and/or Residential Mobile Crisis Unit telephone number  Request made of family/significant other to:  Remove weapons (e.g., guns, rifles, knives), all items previously/currently identified as safety concern.    Remove drugs/medications (over-the-counter, prescriptions, illicit drugs), all items previously/currently identified as a safety concern.  The family member/significant other verbalizes understanding of the suicide prevention education information provided.  The family member/significant other agrees to remove the items of safety concern listed above. CSW intern also discussed with patient's mother Timothy [name](Timothy [name]) about the homicidal ideations towards a cousin. Patient denied any homicidal ideations. Timothy [employer] stated that she had not known about these threats and was not sure about who the cousin is. Patient's mother has been made aware of these concerns.   Timothy DaubJolan Jjesus [name] 08/13/2016, 12:21 PM

## 2016-08-14 NOTE — [facility] Group Notes (Signed)
Adult Psychoeducational Group Note  Date:  08/14/2016 Time: 8:15 PM  Group Topic/Focus:  Wrap-Up Group:   The focus of this group is to help patients review their daily goal of treatment and discuss progress on daily workbooks.  Participation Level:  Active  Participation Quality:  Attentive  Affect:  Appropriate  Cognitive:  Alert  Insight: Good  Engagement in Group:  Engaged  Modes of Intervention:  Discussion and Education  Additional Comments:  Patient reported working to comply with his daily medication schedule and focus on better things.  Patient confirmed feeling comfortable able working on his goal.    Talbert NanSLOAN, Lynia Landry N 08/14/2016, 8:41 PM

## 2016-08-14 NOTE — Progress Notes (Signed)
D: Patient's self inventory sheet: patient has good sleep, requested sleep medication.good  Appetite, normal energy level, good concentration. Rated depression 6/10, hopeless 4/10, anxiety 5/10. SI/HI/AVH: continues to report SI, HI and voices. Physical complaints are pain in back and left leg, numbness in hand. Goal is "take meds". Patient has been in bed all morning, minimal interaction and participation A: Medications administered, assessed medication knowledge and education given on medication regimen.  Emotional support and encouragement given patient. R: Continues to endorse SI and HI , contracts for safety. Safety maintained with 15 minute checks.

## 2016-08-14 NOTE — Progress Notes (Signed)
[facility] MD Progress Note  08/14/2016 3:05 PM Timothy [name]  MRN:  [id number] Subjective:  I'm sleeping better.  I still hear voices and having suicidal thoughts.  Objective; Patient seen chart reviewed.  Patient had a good night sleep but he continued to endorse suicidal thoughts and feeling depressed sad and hopeless. He also endorse having homicidal thoughts towards people in general but they're less intense and less frequent.  He is taking his medication and reported no side effects.  He has no tremors or shakes.  He remained isolated withdrawn and limited participation in group.  He endorses auditory hallucination or also less intense from the past.  Patient remains guarded and easily irritable but there is no behavioral problem in the unit.  Principal Problem: Schizoaffective disorder, depressive type (HCC) Diagnosis:   Patient Active Problem List   Diagnosis Date Noted  . Benzodiazepine abuse [F13.10] 08/13/2016  . Schizoaffective disorder, depressive type (HCC) [F25.1] 08/12/2016  . Schizoaffective disorder, bipolar type (HCC) [F25.0] 09/18/2014  . Tobacco use disorder [F17.200] 09/18/2014  . 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 Cannabis use disorder, severe, dependence (HCC) [F12.20] 09/17/2014   Total Time spent with patient: 20 minutes  Past Psychiatric History: Reviewed.  Past Medical History:  Past Medical History:  Diagnosis Date  . Cannabis abuse   . Paranoid schizophrenia (HCC)   . Schizoaffective disorder, depressive type (HCC) 09/17/2014   History reviewed. No pertinent surgical history. Family History:  Family History  Problem Relation Age of Onset  . Mental illness Brother   . Drug abuse Brother   . Mental illness Cousin   . Suicidality Cousin   . Diabetes Maternal Grandmother    Family Psychiatric  History: reviewed. Social History:  History  Alcohol Use No     History  Drug Use  . Types: Marijuana    Social History   Social  History  . Marital status: Single    Spouse name: N/A  . Number of children: N/A  . Years of education: N/A   Social History Main Topics  . Smoking status: Never Smoker  . Smokeless tobacco: Never Used  . Alcohol use No  . Drug use: Yes    Types: Marijuana  . Sexual activity: No   Other Topics Concern  . None   Social History Narrative  . None   Additional Social History:    Pain Medications: SEE MAR Prescriptions: See MAR Over the Counter: SEE MAR History of alcohol / drug use?: Yes Longest period of sobriety (when/how long): unspecified Negative Consequences of Use: Financial, Legal, Personal relationships Withdrawal Symptoms: Patient aware of relationship between substance abuse and physical/medical complications Name of Substance 1: marijuana 1 - Age of First Use: unspecified 1 - Amount (size/oz): 1/8 th 1 - Frequency: daily 1 - Duration: years 1 - Last Use / Amount: yesterday Name of Substance 2: Benzos, Xanex 2 - Age of First Use: unspecified 2 - Amount (size/oz): 2-3 pills  (2mg -off the streets) 2 - Frequency: daily 2 - Duration: years 2 - Last Use / Amount: unknown Name of Substance 3: Alcohol  3 - Age of First Use: 13 3 - Amount (size/oz): 1/2 of a 5th of vodka 3 - Frequency: daily 3 - Duration: ongoing 3 - Last Use / Amount: 08/10/16              Sleep: Fair  Appetite:  Fair  Current Medications: Current Facility-Administered Medications  Medication Dose Route Frequency Provider Last Rate Last Dose  .  acetaminophen (TYLENOL) tablet 650 mg  650 mg Oral Q6H PRN Laveda AbbeLaurie Britton Parks, NP   650 mg at 08/13/16 1522  . alum & mag hydroxide-simeth (MAALOX/MYLANTA) 200-200-20 MG/5ML suspension 30 mL  30 mL Oral Q4H PRN Laveda AbbeLaurie Britton Parks, NP      . chlordiazePOXIDE (LIBRIUM) capsule 25 mg  25 mg Oral Q4H PRN Jomarie LongsSaramma Eappen, MD      . divalproex (DEPAKOTE ER) 24 hr tablet 750 mg  750 mg Oral QHS Jomarie LongsSaramma Eappen, MD   750 mg at 08/13/16 2138  .  hydrOXYzine (ATARAX/VISTARIL) tablet 25 mg  25 mg Oral Q6H PRN Laveda AbbeLaurie Britton Parks, NP      . magnesium hydroxide (MILK OF MAGNESIA) suspension 30 mL  30 mL Oral Daily PRN Laveda AbbeLaurie Britton Parks, NP      . QUEtiapine (SEROQUEL) tablet 400 mg  400 mg Oral QHS Jomarie LongsSaramma Eappen, MD   400 mg at 08/13/16 2138  . QUEtiapine (SEROQUEL) tablet 50 mg  50 mg Oral TID PRN Jomarie LongsSaramma Eappen, MD      . traZODone (DESYREL) tablet 50 mg  50 mg Oral QHS PRN Laveda AbbeLaurie Britton Parks, NP        Lab Results: No results found for this or any previous visit (from the past 48 hour(s)).  Blood Alcohol level:  Lab Results  Component Value Date   ETH <5 08/11/2016   ETH <5 03/10/2016    Metabolic Disorder Labs: Lab Results  Component Value Date   HGBA1C 5.9 (H) 11/26/2015   MPG 123 11/26/2015   MPG 111 09/19/2014   No results found for: PROLACTIN Lab Results  Component Value Date   CHOL 240 (H) 11/26/2015   TRIG 149 11/26/2015   HDL 41 11/26/2015   CHOLHDL 5.9 11/26/2015   VLDL 30 11/26/2015   LDLCALC 169 (H) 11/26/2015   LDLCALC 100 (H) 09/19/2014    Physical Findings: AIMS: Facial and Oral Movements Muscles of Facial Expression: None, normal Lips and Perioral Area: None, normal Jaw: None, normal Tongue: None, normal,Extremity Movements Upper (arms, wrists, hands, fingers): None, normal Lower (legs, knees, ankles, toes): None, normal, Trunk Movements Neck, shoulders, hips: None, normal, Overall Severity Severity of abnormal movements (highest score from questions above): None, normal Incapacitation due to abnormal movements: None, normal Patient's awareness of abnormal movements (rate only patient's report): No Awareness, Dental Status Current problems with teeth and/or dentures?: No Does patient usually wear dentures?: No  CIWA:  CIWA-Ar Total: 0 COWS:     Musculoskeletal: Strength & Muscle Tone: within normal limits Gait & Station: normal Patient leans: N/A  Psychiatric Specialty  Exam: Physical Exam  Review of Systems  Constitutional: Negative.   Psychiatric/Behavioral: Positive for depression, hallucinations and suicidal ideas. The patient has insomnia.     Blood pressure (!) 123/55, pulse 71, temperature 98.8 F (37.1 C), temperature source Oral, resp. rate 16, height 6' 3.25" (1.911 m), weight 115.2 kg (254 lb).Body mass index is 31.54 kg/m.  General Appearance: Guarded  Eye Contact:  Fair  Speech:  Normal Rate  Volume:  Decreased  Mood:  Anxious and Depressed  Affect:  Congruent  Thought Process:  Descriptions of Associations: Circumstantial  Orientation:  Full (Time, Place, and Person)  Thought Content:  Hallucinations: Auditory, Paranoid Ideation and Rumination  Suicidal Thoughts:  Yes.  with intent/plan, want to use gun  Homicidal Thoughts:  Yes.  without intent/plan  Memory:  Immediate;   Fair Recent;   Fair Remote;   Fair  Judgement:  Impaired  Insight:  Fair  Psychomotor Activity:  Decreased  Concentration:  Concentration: Fair and Attention Span: Fair  Recall:  [employer] of Knowledge:  Fair  Language:  Fair  Akathisia:  No  Handed:  Right  AIMS (if indicated):     Assets:  Desire for Improvement  ADL's:  Intact  Cognition:  WNL  Sleep:  Number of Hours: 6.75     Treatment Plan Summary: Medication management and patient continued to exhibit psychosis, suicidal thoughts and remained withdrawn but slowly improving. Continue Seroquel 400 mg at bedtime to help psychosis and hallucination. Continue Depakote 750 mg at bedtime we will get level in 4 days. Continue trazodone at bedtime to help sleep. Encouraged to participate in group milieu therapy. Patient is positive for cannabis and we will refer to substance abuse program. His prolactin level, hemoglobin A1c and lipid panel still pending. Discussed medication side effects and benefits.  Emeline Simpson T., MD 08/14/2016, 3:05 PM

## 2016-08-14 NOTE — [facility] Group Notes (Signed)
[facility] Group Notes: (Clinical Social Work)   08/14/2016      Type of Therapy:  Group Therapy   Participation Level:  Did Not Attend despite MHT prompting   Raylan Hanton Grossman-Orr, LCSW 08/14/2016, 12:19 PM     

## 2016-08-14 NOTE — Progress Notes (Signed)
Writer spoke with patient 1:1 and he reports having had a good day. He has been up in the dayroom watching tv, playing cards and interacting with peers appropriately. He reports passive si and verbally contracts for safety. He does mention hi towards his cousin b/c he reports that he messed up his previous housing telling him he could come and live with him and then changed his mind. He reports that he plans to stay on his medication once discharged. Support given and he was informed of his hs medications. Safety maintained on unit with 15 min checks.

## 2016-08-14 NOTE — [facility] Group Notes (Signed)
[facility] Group Notes:  (Nursing/MHT/Case Management/Adjunct)  Date:  08/14/2016  Time:  11:57 AM  Type of Therapy:  Psychoeducational Skills  Participation Level:  Did Not Attend  Participation Quality:  Did Not Attend  Affect:  Did Not Attend  Cognitive:  Did Not Attend  Insight:  None  Engagement in Group:  Did Not Attend  Modes of Intervention:  Did Not Attend  Summary of Progress/Problems: Pt did not attend patient self inventory group.   Jacquelyne BalintForrest, Kathleen Likins Shanta 08/14/2016, 11:57 AM

## 2016-08-14 NOTE — Progress Notes (Signed)
Writer has observed patient up in the dayroom watching tv and playing cards with peers. He voiced no complaints and is made aware of his scheduled hs medications. He denies si/hi/a/v hallucinations. Writer inquired about him being isolative and in bed today and he reports that his medication makes him sleepy. Support given and safety maintained on unit with 15 min checks.

## 2016-08-15 LAB — TSH: TSH: 1.013 u[IU]/mL (ref 0.350–4.500)

## 2016-08-15 LAB — LIPID PANEL
CHOL/HDL RATIO: 4.1 ratio
Cholesterol: 158 mg/dL (ref 0–200)
HDL: 39 mg/dL — ABNORMAL LOW (ref >40)
LDL CALC: 95 mg/dL (ref 0–99)
Triglycerides: 122 mg/dL (ref <150)
VLDL: 24 mg/dL (ref 0–40)

## 2016-08-15 NOTE — [facility] Group Notes (Signed)
[facility] Group Notes: (Clinical Social Work)   08/15/2016      Type of Therapy:  Group Therapy   Participation Level:  Did Not Attend despite MHT prompting   Ambrose MantleMareida Grossman-Orr, LCSW 08/15/2016, 12:14 PM

## 2016-08-15 NOTE — Progress Notes (Signed)
Patient has been in bed the entire day. He denies feeling physically ill but states that he feels "wiped out by the medication" and just wants to sleep. Patient was given a pitcher of fluids with ice and encouraged to drink more fluids. When questioned he denied SI or AH but did not volunteer any information and responses were very brief.

## 2016-08-15 NOTE — Progress Notes (Signed)
[facility] MD Progress Note  08/15/2016 12:54 PM Timothy [name]  MRN:  [id number] Subjective:  I still hear voices but they're less intense from the past.  Objective; Patient seen chart reviewed.  Patient is slowly and gradually getting better.  He is still have hallucination and feels paranoid but it is less intense and less frequent from the past.  He continued to endorse auditory hallucination and having suicidal thoughts but denies any plan or any intent.  He attended groups but remains isolated withdrawn and minimal participation.  He is taking his medication and reported no side effects.  He has no tremors or shakes.  He is not disruptive in the unit.   Principal Problem: Schizoaffective disorder, depressive type (HCC) Diagnosis:   Patient Active Problem List   Diagnosis Date Noted  . Benzodiazepine abuse [F13.10] 08/13/2016  . Schizoaffective disorder, depressive type (HCC) [F25.1] 08/12/2016  . Schizoaffective disorder, bipolar type (HCC) [F25.0] 09/18/2014  . Tobacco use disorder [F17.200] 09/18/2014  . 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 Cannabis use disorder, severe, dependence (HCC) [F12.20] 09/17/2014   Total Time spent with patient: 20 minutes  Past Psychiatric History: Reviewed.  Past Medical History:  Past Medical History:  Diagnosis Date  . Cannabis abuse   . Paranoid schizophrenia (HCC)   . Schizoaffective disorder, depressive type (HCC) 09/17/2014   History reviewed. No pertinent surgical history. Family History:  Family History  Problem Relation Age of Onset  . Mental illness Brother   . Drug abuse Brother   . Mental illness Cousin   . Suicidality Cousin   . Diabetes Maternal Grandmother    Family Psychiatric  History: Reviewed. Social History:  History  Alcohol Use No     History  Drug Use  . Types: Marijuana    Social History   Social History  . Marital status: Single    Spouse name: N/A  . Number of children: N/A  . Years  of education: N/A   Social History Main Topics  . Smoking status: Never Smoker  . Smokeless tobacco: Never Used  . Alcohol use No  . Drug use: Yes    Types: Marijuana  . Sexual activity: No   Other Topics Concern  . None   Social History Narrative  . None   Additional Social History:    Pain Medications: SEE MAR Prescriptions: See MAR Over the Counter: SEE MAR History of alcohol / drug use?: Yes Longest period of sobriety (when/how long): unspecified Negative Consequences of Use: Financial, Legal, Personal relationships Withdrawal Symptoms: Patient aware of relationship between substance abuse and physical/medical complications Name of Substance 1: marijuana 1 - Age of First Use: unspecified 1 - Amount (size/oz): 1/8 th 1 - Frequency: daily 1 - Duration: years 1 - Last Use / Amount: yesterday Name of Substance 2: Benzos, Xanex 2 - Age of First Use: unspecified 2 - Amount (size/oz): 2-3 pills  (2mg -off the streets) 2 - Frequency: daily 2 - Duration: years 2 - Last Use / Amount: unknown Name of Substance 3: Alcohol  3 - Age of First Use: 13 3 - Amount (size/oz): 1/2 of a 5th of vodka 3 - Frequency: daily 3 - Duration: ongoing 3 - Last Use / Amount: 08/10/16              Sleep: Fair  Appetite:  Fair  Current Medications: Current Facility-Administered Medications  Medication Dose Route Frequency Provider Last Rate Last Dose  . acetaminophen (TYLENOL) tablet 650 mg  650 mg  Oral Q6H PRN Laveda AbbeLaurie Britton Parks, NP   650 mg at 08/14/16 1745  . alum & mag hydroxide-simeth (MAALOX/MYLANTA) 200-200-20 MG/5ML suspension 30 mL  30 mL Oral Q4H PRN Laveda AbbeLaurie Britton Parks, NP      . chlordiazePOXIDE (LIBRIUM) capsule 25 mg  25 mg Oral Q4H PRN Jomarie LongsSaramma Eappen, MD      . divalproex (DEPAKOTE ER) 24 hr tablet 750 mg  750 mg Oral QHS Saramma Eappen, MD   750 mg at 08/14/16 2115  . hydrOXYzine (ATARAX/VISTARIL) tablet 25 mg  25 mg Oral Q6H PRN Laveda AbbeLaurie Britton Parks, NP      .  magnesium hydroxide (MILK OF MAGNESIA) suspension 30 mL  30 mL Oral Daily PRN Laveda AbbeLaurie Britton Parks, NP      . QUEtiapine (SEROQUEL) tablet 400 mg  400 mg Oral QHS Jomarie LongsSaramma Eappen, MD   400 mg at 08/14/16 2115  . QUEtiapine (SEROQUEL) tablet 50 mg  50 mg Oral TID PRN Jomarie LongsSaramma Eappen, MD      . traZODone (DESYREL) tablet 50 mg  50 mg Oral QHS PRN Laveda AbbeLaurie Britton Parks, NP        Lab Results:  Results for orders placed or performed during the hospital encounter of 08/12/16 (from the past 48 hour(s))  TSH     Status: None   Collection Time: 08/15/16  6:09 AM  Result Value Ref Range   TSH 1.013 0.350 - 4.500 uIU/mL    Comment: Performed by a 3rd Generation assay with a functional sensitivity of <=0.01 uIU/mL. Performed at [facility], [address] [place], [place] [address]   Lipid panel     Status: Abnormal   Collection Time: 08/15/16  6:09 AM  Result Value Ref Range   Cholesterol 158 0 - 200 mg/dL   Triglycerides 914122 <782<150 mg/dL   HDL 39 (L) >95>40 mg/dL   Total CHOL/HDL Ratio 4.1 RATIO   VLDL 24 0 - 40 mg/dL   LDL Cholesterol 95 0 - 99 mg/dL    Comment:        Total Cholesterol/HDL:CHD Risk Coronary Heart Disease Risk Table                     Men   Women  1/2 Average Risk   3.4   3.3  Average Risk       5.0   4.4  2 X Average Risk   9.6   7.1  3 X Average Risk  23.4   11.0        Use the calculated Patient Ratio above and the CHD Risk Table to determine the patient's CHD Risk.        ATP III CLASSIFICATION (LDL):  <100     mg/dL   Optimal  [id number]  mg/dL   Near or Above                    Optimal  130-159  mg/dL   Borderline  [id number]  mg/dL   High  >962>190     mg/dL   Very High Performed at [facility], [address] [place], [place] [address]     Blood Alcohol level:  Lab Results  Component Value Date   [facility] 08/11[facility]2016   ETH <5 03/10/2016    Metabolic Disorder Labs: Lab Results  Component Value Date   HGBA1C 5.9 (H) 11/26/2015   MPG  123 11/26/2015   MPG 111 09/19/2014   No results  found for: PROLACTIN Lab Results  Component Value Date   CHOL 158 08/15/2016   TRIG 122 08/15/2016   HDL 39 (L) 08/15/2016   CHOLHDL 4.1 08/15/2016   VLDL 24 08/15/2016   LDLCALC 95 08/15/2016   LDLCALC 169 (H) 11/26/2015    Physical Findings: AIMS: Facial and Oral Movements Muscles of Facial Expression: None, normal Lips and Perioral Area: None, normal Jaw: None, normal Tongue: None, normal,Extremity Movements Upper (arms, wrists, hands, fingers): None, normal Lower (legs, knees, ankles, toes): None, normal, Trunk Movements Neck, shoulders, hips: None, normal, Overall Severity Severity of abnormal movements (highest score from questions above): None, normal Incapacitation due to abnormal movements: None, normal Patient's awareness of abnormal movements (rate only patient's report): No Awareness, Dental Status Current problems with teeth and/or dentures?: No Does patient usually wear dentures?: No  CIWA:  CIWA-Ar Total: 0 COWS:     Musculoskeletal: Strength & Muscle Tone: within normal limits Gait & Station: normal Patient leans: N/A  Psychiatric Specialty Exam: Physical Exam  Review of Systems  Psychiatric/Behavioral: Positive for depression, hallucinations and suicidal ideas. The patient is nervous/anxious.     Blood pressure 99/62, pulse (!) 117, temperature 98.7 F (37.1 C), temperature source Oral, resp. rate 18, height 6' 3.25" (1.911 m), weight 115.2 kg (254 lb).Body mass index is 31.54 kg/m.  General Appearance: Guarded  Eye Contact:  Fair  Speech:  Clear and Coherent  Volume:  Normal  Mood:  Anxious and Depressed  Affect:  Appropriate  Thought Process:  Goal Directed  Orientation:  Full (Time, Place, and Person)  Thought Content:  Hallucinations: Auditory and Paranoid Ideation  Suicidal Thoughts:  Yes.  without intent/plan  Homicidal Thoughts:  No  Memory:  Immediate;   Fair Recent;   Fair Remote;    Fair  Judgement:  Fair  Insight:  Fair  Psychomotor Activity:  Decreased  Concentration:  Concentration: Fair and Attention Span: Fair  Recall:  [employer] of Knowledge:  Good  Language:  Good  Akathisia:  No  Handed:  Right  AIMS (if indicated):     Assets:  Communication Skills Desire for Improvement  ADL's:  Intact  Cognition:  WNL  Sleep:  Number of Hours: 6.75     Treatment Plan Summary: Daily contact with patient to assess and evaluate symptoms and progress in treatment and Patient is slowly and gradually improving from the past.  Continue Seroquel 400 mg at bedtime to help psychosis and hallucination. Continue Depakote 750 mg at bedtime.  We will get level in 3 days.  Continue trazodone at bedtime as needed for insomnia. Encouraged to participate in group milieu therapy. Social worker to start discharge planning, patient is positive for cannabis and we will do substance abuse program. His Humulin A1c and prolactin still pending.     Eleanore Junio T., MD 08/15/2016, 12:54 PM

## 2016-08-16 LAB — HEMOGLOBIN A1C
Hgb A1c MFr Bld: 5.4 % (ref 4.8–5.6)
Mean Plasma Glucose: 108 mg/dL

## 2016-08-16 LAB — PROLACTIN: PROLACTIN: 33.2 ng/mL — AB (ref 4.0–15.2)

## 2016-08-16 MED ORDER — BUPROPION HCL 75 MG PO TABS
75.0000 mg | ORAL_TABLET | Freq: Every day | ORAL | Status: DC
  Administered 2016-08-17 – 2016-08-19 (×3): 75 mg via ORAL
  Filled 2016-08-16 (×4): qty 1

## 2016-08-16 MED ORDER — QUETIAPINE FUMARATE 200 MG PO TABS
200.0000 mg | ORAL_TABLET | Freq: Every day | ORAL | Status: DC
  Administered 2016-08-16: 200 mg via ORAL
  Filled 2016-08-16 (×3): qty 1

## 2016-08-16 MED ORDER — DIVALPROEX SODIUM ER 250 MG PO TB24
750.0000 mg | ORAL_TABLET | Freq: Every day | ORAL | Status: DC
  Administered 2016-08-16 – 2016-08-19 (×4): 750 mg via ORAL
  Filled 2016-08-16 (×3): qty 3
  Filled 2016-08-16: qty 21
  Filled 2016-08-16 (×2): qty 3

## 2016-08-16 NOTE — Progress Notes (Signed)
Writer has observed patient up in the dayroom briefly tonight. Writer had to remind him that he had medications to take because he had already laid down for the night. He reports that his medications make him sleepy. Writer encouraged him to talk with the doctor on tomorrow about feeling sedated during the day. Support given and safety maintained on the unit with 15 min checks.

## 2016-08-16 NOTE — Progress Notes (Signed)
[facility] MD Progress Note  08/16/2016 3:09 PM Timothy [name]  MRN:  [id number] Subjective:  Pt states " I still hear voices asking me to kill myself. I also feel groggy , unable to wake up in the AM."   Objective;Patient seen and chart reviewed.Discussed patient with treatment team.  Pt today seen in bed , eyes closed , responds to questions minimally and in monosyllables . Pt reports his medications are making him groggy. Pt also with command AH asking him to kill self. Pt remains isolative per staff , does not attend groups , is in bed most of the day. Will need continued treatment.    Principal Problem: Schizoaffective disorder, depressive type (HCC) Diagnosis:   Patient Active Problem List   Diagnosis Date Noted  . Benzodiazepine abuse [F13.10] 08/13/2016  . Schizoaffective disorder, depressive type (HCC) [F25.1] 08/12/2016  . Schizoaffective disorder, bipolar type (HCC) [F25.0] 09/18/2014  . Tobacco use disorder [F17.200] 09/18/2014  . 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 Cannabis use disorder, severe, dependence (HCC) [F12.20] 09/17/2014   Total Time spent with patient: 20 minutes  Past Psychiatric History: Please see H&P.   Past Medical History:  Past Medical History:  Diagnosis Date  . Cannabis abuse   . Paranoid schizophrenia (HCC)   . Schizoaffective disorder, depressive type (HCC) 09/17/2014   History reviewed. No pertinent surgical history. Family History:  Family History  Problem Relation Age of Onset  . Mental illness Brother   . Drug abuse Brother   . Mental illness Cousin   . Suicidality Cousin   . Diabetes Maternal Grandmother    Family Psychiatric  History: Please see H&P.  Social History: Please see H&P.  History  Alcohol Use No     History  Drug Use  . Types: Marijuana    Social History   Social History  . Marital status: Single    Spouse name: N/A  . Number of children: N/A  . Years of education: N/A   Social  History Main Topics  . Smoking status: Never Smoker  . Smokeless tobacco: Never Used  . Alcohol use No  . Drug use: Yes    Types: Marijuana  . Sexual activity: No   Other Topics Concern  . None   Social History Narrative  . None   Additional Social History:    Pain Medications: SEE MAR Prescriptions: See MAR Over the Counter: SEE MAR History of alcohol / drug use?: Yes Longest period of sobriety (when/how long): unspecified Negative Consequences of Use: Financial, Legal, Personal relationships Withdrawal Symptoms: Patient aware of relationship between substance abuse and physical/medical complications Name of Substance 1: marijuana 1 - Age of First Use: unspecified 1 - Amount (size/oz): 1/8 th 1 - Frequency: daily 1 - Duration: years 1 - Last Use / Amount: yesterday Name of Substance 2: Benzos, Xanex 2 - Age of First Use: unspecified 2 - Amount (size/oz): 2-3 pills  (2mg -off the streets) 2 - Frequency: daily 2 - Duration: years 2 - Last Use / Amount: unknown Name of Substance 3: Alcohol  3 - Age of First Use: 13 3 - Amount (size/oz): 1/2 of a 5th of vodka 3 - Frequency: daily 3 - Duration: ongoing 3 - Last Use / Amount: 08/10/16              Sleep: Fair  Appetite:  Fair  Current Medications: Current Facility-Administered Medications  Medication Dose Route Frequency Provider Last Rate Last Dose  . acetaminophen (TYLENOL) tablet 650 mg  650 mg Oral Q6H PRN Laveda Abbe, NP   650 mg at 08/14/16 1745  . alum & mag hydroxide-simeth (MAALOX/MYLANTA) 200-200-20 MG/5ML suspension 30 mL  30 mL Oral Q4H PRN Laveda Abbe, NP      . Melene Muller ON 08/17/2016] buPROPion [facility]) tablet 75 mg  75 mg Oral q morning - 10a Katricia Prehn, MD      . chlordiazePOXIDE (LIBRIUM) capsule 25 mg  25 mg Oral Q4H PRN Jomarie Longs, MD      . divalproex (DEPAKOTE ER) 24 hr tablet 750 mg  750 mg Oral Q2000 Kimber Fritts, MD      . hydrOXYzine (ATARAX/VISTARIL) tablet 25  mg  25 mg Oral Q6H PRN Laveda Abbe, NP      . magnesium hydroxide (MILK OF MAGNESIA) suspension 30 mL  30 mL Oral Daily PRN Laveda Abbe, NP      . QUEtiapine (SEROQUEL) tablet 200 mg  200 mg Oral QHS Horst Ostermiller, MD      . QUEtiapine (SEROQUEL) tablet 50 mg  50 mg Oral TID PRN Jomarie Longs, MD      . traZODone (DESYREL) tablet 50 mg  50 mg Oral QHS PRN Laveda Abbe, NP        Lab Results:  Results for orders placed or performed during the hospital encounter of 08/12/16 (from the past 48 hour(s))  TSH     Status: None   Collection Time: 08/15/16  6:09 AM  Result Value Ref Range   TSH 1.013 0.350 - 4.500 uIU/mL    Comment: Performed by a 3rd Generation assay with a functional sensitivity of <=0.01 uIU/mL. Performed at [facility], [address], [place], [place] [address]   Lipid panel     Status: Abnormal   Collection Time: 08/15/16  6:09 AM  Result Value Ref Range   Cholesterol 158 0 - 200 mg/dL   Triglycerides 962 <952 mg/dL   HDL 39 (L) >84 mg/dL   Total CHOL/HDL Ratio 4.1 RATIO   VLDL 24 0 - 40 mg/dL   LDL Cholesterol 95 0 - 99 mg/dL    Comment:        Total Cholesterol/HDL:CHD Risk Coronary Heart Disease Risk Table                     Men   Women  1/2 Average Risk   3.4   3.3  Average Risk       5.0   4.4  2 X Average Risk   9.6   7.1  3 X Average Risk  23.4   11.0        Use the calculated Patient Ratio above and the CHD Risk Table to determine the patient's CHD Risk.        ATP III CLASSIFICATION (LDL):  <100     mg/dL   Optimal  132-440  mg/dL   Near or Above                    Optimal  130-159  mg/dL   Borderline  102-725  mg/dL   High  >366     mg/dL   Very High Performed at [facility], [address] [place], [place] [address]   Hemoglobin A1c     Status: None   Collection Time: 08/15/16  6:09 AM  Result Value Ref Range   Hgb A1c MFr Bld 5.4 4.8 - 5.6 %  Comment: (NOTE)         Pre-diabetes:  5.7 - 6.4         Diabetes: >6.4         Glycemic control for adults with diabetes: <7.0    Mean Plasma Glucose 108 mg/dL    Comment: (NOTE) Performed At: [facility] [address] [place], [place] [id number] Mila Homer MD [id number] Performed at [facility], [address], [place], [place] [address]   Prolactin     Status: Abnormal   Collection Time: 08/15/16  6:09 AM  Result Value Ref Range   Prolactin 33.2 (H) 4.0 - 15.2 ng/mL    Comment: (NOTE) Performed At: [facility] [address] [place], [place] [id number] Mila Homer MD [id number] Performed at [facility], [address] [place], [place] [address]     Blood Alcohol level:  Lab Results  Component Value Date   [facility] <5 08/11/2016   ETH <5 03/10/2016    Metabolic Disorder Labs: Lab Results  Component Value Date   HGBA1C 5.4 08/15/2016   MPG 108 08/15/2016   MPG 123 11/26/2015   Lab Results  Component Value Date   PROLACTIN 33.2 (H) 08/15/2016   Lab Results  Component Value Date   CHOL 158 08/15/2016   TRIG 122 08/15/2016   HDL 39 (L) 08/15/2016   CHOLHDL 4.1 08/15/2016   VLDL 24 08/15/2016   LDLCALC 95 08/15/2016   LDLCALC 169 (H) 11/26/2015    Physical Findings: AIMS: Facial and Oral Movements Muscles of Facial Expression: None, normal Lips and Perioral Area: None, normal Jaw: None, normal Tongue: None, normal,Extremity Movements Upper (arms, wrists, hands, fingers): None, normal Lower (legs, knees, ankles, toes): None, normal, Trunk Movements Neck, shoulders, hips: None, normal, Overall Severity Severity of abnormal movements (highest score from questions above): None, normal Incapacitation due to abnormal movements: None, normal Patient's awareness of abnormal movements (rate only patient's report): No Awareness, Dental Status Current problems with teeth and/or dentures?: No Does patient usually wear dentures?: No   CIWA:  CIWA-Ar Total: 1 COWS:     Musculoskeletal: Strength & Muscle Tone: within normal limits Gait & Station: seen in bed Patient leans: N/A  Psychiatric Specialty Exam: Physical Exam  Nursing note and vitals reviewed.   Review of Systems  Psychiatric/Behavioral: Positive for depression, hallucinations and suicidal ideas.  All other systems reviewed and are negative.   Blood pressure 133/67, pulse 70, temperature 98.7 F (37.1 C), temperature source Oral, resp. rate 18, height 6' 3.25" (1.911 m), weight 115.2 kg (254 lb).Body mass index is 31.54 kg/m.  General Appearance: Guarded  Eye Contact:  None  Speech:  Slow  Volume:  monosyllables  Mood:  Depressed and Dysphoric  Affect:  Constricted  Thought Process:  Linear and Descriptions of Associations: Intact  Orientation:  Other:  person, place  Thought Content:  Hallucinations: Auditory Command:  kill self and Paranoid Ideation  Suicidal Thoughts:  Yes.  without intent/plan  Homicidal Thoughts:  No  Memory:  Immediate;   Fair Recent;   Fair Remote;   Fair  Judgement:  Fair  Insight:  Fair  Psychomotor Activity:  Decreased  Concentration:  Concentration: Poor and Attention Span: Poor  Recall:  [employer] of Knowledge:  Fair  Language:  Good  Akathisia:  No  Handed:  Right  AIMS (if indicated):     Assets:  Communication Skills Desire for Improvement  ADL's:  Intact  Cognition:  WNL  Sleep:  Number of Hours: 6.75     Treatment Plan Summary:Patient seen as very drowsy , minimal interaction in milieu , snwers questions in monosyllables , reports AH that are command asking him to kill self.  Schizoaffective disorder, depressive type (HCC) unstable Will continue today 08/16/16 plan as below except where it is noted.   Daily contact with patient to assess and evaluate symptoms and progress in treatment, Medication management and Plan see below   For psychosis: Reduce Seroquel to 200 mg po qhs, dose reduce due  to drowsiness.  For Mood sx: Change Depakote ER 750 mg po qhsto 8 pm  Depakote level in 5 days. Add Wellbutrin 75 mg po daily for motivation.   For insomnia: Trazodone 50 mg po qhs prn.  For cannabis use disorder: Substance abuse counseling. Referral to substance abuse program.  For BZD abuse : CIWA/librium protocol. Substance abuse rehab referral .  Tobacco use disorder: Nicotine patch. Smoking cessation counseling provided.  Will continue to monitor vitals ,medication compliance and treatment side effects while patient is here.   Will monitor for medical issues as well as call consult as needed.   Reviewed labs- cbc - wnl, cmp - wnl, uds- THC - pos , bal <5  ,lipid panel- wnl  hba1c- wnl , pl- elevated - will need to be monitored.  Pending EKG for qtc monitoring.  CSW will continue working on disposition.   Patient to participate in therapeutic milieu  Krisha Beegle, MD 08/16/2016, 3:09 PM

## 2016-08-16 NOTE — [facility] Group Notes (Signed)
[facility] LCSW Group Therapy  08/16/2016 3:23 PM  Type of Therapy:  Group Therapy  Participation Level:  Minimal   Participation Quality:  Inattentive  Affect:  Flat  Cognitive:  Alert  Insight: Limited   Engagement in Therapy:  Limited   Modes of Intervention:  Activity, Discussion, Education, Socialization and Support  Summary of Progress/Problems: Patients identify obstacles, self-sabotaging and enabling behaviors. Patients explore aspects of self sabotage and enabling and how to limit these self-destructive behaviors in everyday life. Pt attended group and stayed the entire time. He sat quietly and listened to other group members share.   Sempra EnergyCandace L Kenisha Lynds MSW, LCSWA  08/16/2016, 3:23 PM

## 2016-08-16 NOTE — Progress Notes (Signed)
Recreation Therapy Notes  Date: 08/16/16 Time: 1000 Location: [address] Dayroom  Group Topic: Self-Esteem  Goal Area(s) Addresses:  Patient will identify positive ways to increase self-esteem. Patient will verbalize benefit of increased self-esteem.  Intervention: Empty picture frame worksheet, markers, colored pencils  Activity: Mirror, Mirror.  Patients were given a worksheet with an empty picture frame on it.  Patients were to draw a picture and/or us words to describe what they think of themselves.  Education:  Self-Esteem, Discharge Planning.   Education Outcome: Acknowledges education/In group clarification offered/Needs additional education  Clinical Observations/Feedback: Pt did not attend group.   Trenia Tennyson, LRT/CTRS         Keir Viernes A 08/16/2016 11:52 AM 

## 2016-08-16 NOTE — Progress Notes (Signed)
DAR NOTE: Patient presents with flat affect and depressed mood.  Reports auditory hallucination. Described energy level as normal and concentration as good.  Rates depression at 4, hopelessness at 5.5, and anxiety at 6.  Maintained on routine safety checks.  Medications given as prescribed.  Support and encouragement offered as needed.  Attended group and participated.  States goal for today is "keep moving forward."  Patient remained in his room most of this shift.  Complain of lightheaded and dizziness.  MD aware of complaint.

## 2016-08-17 LAB — VALPROIC ACID LEVEL: Valproic Acid Lvl: 63 ug/mL (ref 50.0–100.0)

## 2016-08-17 MED ORDER — QUETIAPINE FUMARATE 25 MG PO TABS
225.0000 mg | ORAL_TABLET | Freq: Every day | ORAL | Status: DC
  Administered 2016-08-17: 21:00:00 225 mg via ORAL
  Filled 2016-08-17 (×3): qty 1

## 2016-08-17 NOTE — Progress Notes (Signed)
Adult Psychoeducational Group Note  Date:  08/17/2016 Time:  8:35 PM  Group Topic/Focus:  Wrap-Up Group:   The focus of this group is to help patients review their daily goal of treatment and discuss progress on daily workbooks.  Participation Level:  Active  Participation Quality:  Appropriate  Affect:  Appropriate  Cognitive:  Appropriate  Insight: Appropriate  Engagement in Group:  Engaged  Modes of Intervention:  Discussion  Additional Comments:  Pt stated he had a good day today. Pt stated his goal was to work on his discharge. Pt stated stated maybe in a couple days he will be ready. He is working with the Child psychotherapistsocial worker on finding housing placement and a job. Pt was encouraged to make his needs known to staff.  Caswell CorwinOwen, Delmore Sear C 08/17/2016, 8:35 PM

## 2016-08-17 NOTE — Progress Notes (Signed)
Recreation Therapy Notes  Animal-Assisted Activity (AAA) Program Checklist/Progress Notes Patient Eligibility Criteria Checklist & Daily Group note for Rec Tx Intervention  Date: 02.13.2018 Time: 2:45pm Location: 400 Hall Dayroom    AAA/T Program Assumption of Risk Form signed by Patient/ or Parent Legal Guardian Yes  Patient is free of allergies or sever asthma Yes  Patient reports no fear of animals Yes  Patient reports no history of cruelty to animals Yes  Patient understands his/her participation is voluntary Yes  Patient washes hands before animal contact Yes  Patient washes hands after animal contact Yes  Behavioral Response: Appropriate, Engaged   Education: Hand Washing, Appropriate Animal Interaction   Education Outcome: Acknowledges education.   Clinical Observations/Feedback: Patient discussed with MD for appropriateness in pet therapy session. Both LRT and MD agree patient is appropriate for participation. Patient offered participation in session and signed necessary consent form without issue. Patient attended session, petting therapy dog appropriately form floor level and interacting with peers appropriately during session. Patient returned to locked 500 hall after approximatley 25 minutes and without issue.   Gorje Iyer L Taeja Debellis, LRT/CTRS         Lowery Paullin L 08/17/2016 3:13 PM 

## 2016-08-17 NOTE — Progress Notes (Signed)
Timothy [name]. Timothy [name] had been up and visible in milieu this evening, seen interacting appropriately with peers. Timothy [name] spoke about how he is feeling ok and made mention about feeling tired and groggy in the morning time and believes it may be from his medications. Timothy [name] denied any SI and did receive all bedtime medications without incident and did not verbalize any complaints of pain. A. Support and encouragement provided. R. Safety maintained, will continue to monitor.

## 2016-08-17 NOTE — Progress Notes (Signed)
Recreation Therapy Notes  Date: 08/17/16 Time: 1000 Location: 500 Hall Dayroom  Group Topic: Wellness  Goal Area(s) Addresses:  Patient will define components of whole wellness. Patient will verbalize benefit of whole wellness.  Behavioral Response: Engaged  Intervention:  ArchivistChairs, beach ball   Activity: Keep it ContractorGoing Volleyball.  Patients were arranged in a circle.  Patients were to hit the ball back and forth to each other without the ball coming to a complete stop.  LRT would keep count of how many hits the patients got before the ball stops.  Education: Wellness, Building control surveyorDischarge Planning.   Education Outcome: Acknowledges education/In group clarification offered/Needs additional education.   Clinical Observations/Feedback:  Pt arrived a little late but was fully engaged once he arrived.  Pt seemed flat at times but at other points he was smiling and social with peers.  Pt interacted well with peers and expressed that he liked the activity.  Merilynn Haydu Lindasy, LRT/CTRS         Caroll RancherLindsay, Dub Maclellan A 08/17/2016 11:35 AM

## 2016-08-17 NOTE — Progress Notes (Signed)
DAR NOTE: Patient presents with calm affect and pleasant mood.  Denies pain, auditory and visual hallucinations.  Described energy level as normal and concentration as good.  Rates depression at 7, hopelessness at 8, and anxiety at 6.  Maintained on routine safety checks.  Medications given as prescribed.  Support and encouragement offered as needed.  Attended group and participated.  States goal for today is "staying focus on my discharge and getting better."  Patient remained withdrawn and isolative.  Minimal interaction with staff and peers on the unit.  Offered no complaint.

## 2016-08-17 NOTE — Progress Notes (Addendum)
Patient ID: Timothy [name], male   DOB: 11/15/1995, 21 y.o.   MRN: [id number] D: Client visible on the unit, seen in dayroom watching TV and playing cards. Client reports admission has been helpful "communicating with others, staying focused and taking my medicine" Client report when asked if he was hearing voices "not right now" Client reports [facility].  A: Writer provided emotional support, medications reviewed, administered as ordered. Staff will monitor q3515min for safety. R: Client is safe on the unit, attended group.

## 2016-08-17 NOTE — [facility] Group Notes (Signed)
[facility] LCSW Group Therapy  08/17/2016 1:30 PM   Type of Therapy:  Group Therapy  Participation Level:  Active  Participation Quality:  Attentive  Affect:  Appropriate  Cognitive:  Appropriate  Insight:  Improving  Engagement in Therapy:  Engaged  Modes of Intervention:  Clarification, Education, Exploration and Socialization  Summary of Progress/Problems: Today's group focused on resilience.  Stayed the entire time, engaged throughout.  Talked about ending up homeless prior to admission because he left the shelter to stay with a cousin, who then kicked him out.  He found his mother, who then [facility], and from there came to us.  And he believes this is where he needs to be.  Also cited the example of his grandmother who introduced him to prayer and faith.  Daryel Geraldorth, Timothy [name] 08/17/2016 , 1:30 PM

## 2016-08-17 NOTE — Progress Notes (Addendum)
[facility]  08/17/2016 2:36 PM Timothy FeltyMarkie T [name]  MRN:  [id number] Subjective:  Pt states " I am fine , I feel suicidal at times , but I am better.'    Objective;Patient seen and chart reviewed.Discussed patient with treatment team.  Pt today seen as withdrawn to his bed , has poor eye contact , response to questions asked with eyes closed . Pt continues to have SI - but states it is better, contracts for safety on the unit. Pt denies AH - but per staff is often observed as responding to internal stimuli. Pt often notes as isolative , withdrawn, very minimal participation in milieu.   Principal Problem: Schizoaffective disorder, depressive type (HCC) Diagnosis:   Patient Active Problem List   Diagnosis Date Noted  . Benzodiazepine abuse [F13.10] 08/13/2016  . Schizoaffective disorder, depressive type (HCC) [F25.1] 08/12/2016  . Schizoaffective disorder, bipolar type (HCC) [F25.0] 09/18/2014  . Tobacco use disorder [F17.200] 09/18/2014  . 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 Cannabis use disorder, severe, dependence (HCC) [F12.20] 09/17/2014   Total Time spent with patient: 25 minutes  Past Psychiatric History: Please see H&P.   Past Medical History:  Past Medical History:  Diagnosis Date  . Cannabis abuse   . Paranoid schizophrenia (HCC)   . Schizoaffective disorder, depressive type (HCC) 09/17/2014   History reviewed. No pertinent surgical history. Family History:  Family History  Problem Relation Age of Onset  . Mental illness Brother   . Drug abuse Brother   . Mental illness Cousin   . Suicidality Cousin   . Diabetes Maternal Grandmother    Family Psychiatric  History: Please see H&P.  Social History: Please see H&P.  History  Alcohol Use No     History  Drug Use  . Types: Marijuana    Social History   Social History  . Marital status: Single    Spouse name: N/A  . Number of children: N/A  . Years of education: N/A    Social History Main Topics  . Smoking status: Never Smoker  . Smokeless tobacco: Never Used  . Alcohol use No  . Drug use: Yes    Types: Marijuana  . Sexual activity: No   Other Topics Concern  . None   Social History Narrative  . None   Additional Social History:    Pain Medications: SEE MAR Prescriptions: See MAR Over the Counter: SEE MAR History of alcohol / drug use?: Yes Longest period of sobriety (when/how long): unspecified Negative Consequences of Use: Financial, Legal, Personal relationships Withdrawal Symptoms: Patient aware of relationship between substance abuse and physical/medical complications Name of Substance 1: marijuana 1 - Age of First Use: unspecified 1 - Amount (size/oz): 1/8 th 1 - Frequency: daily 1 - Duration: years 1 - Last Use / Amount: yesterday Name of Substance 2: Benzos, Xanex 2 - Age of First Use: unspecified 2 - Amount (size/oz): 2-3 pills  (2mg -off the streets) 2 - Frequency: daily 2 - Duration: years 2 - Last Use / Amount: unknown Name of Substance 3: Alcohol  3 - Age of First Use: 13 3 - Amount (size/oz): 1/2 of a 5th of vodka 3 - Frequency: daily 3 - Duration: ongoing 3 - Last Use / Amount: 08/10/16              Sleep: Fair  Appetite:  Fair  Current Medications: Current Facility-Administered Medications  Medication Dose Route Frequency Provider Last Rate Last Dose  . acetaminophen (TYLENOL) tablet  650 mg  650 mg Oral Q6H PRN Laveda Abbe, NP   650 mg at 08/14/16 1745  . alum & mag hydroxide-simeth (MAALOX/MYLANTA) 200-200-20 MG/5ML suspension 30 mL  30 mL Oral Q4H PRN Laveda Abbe, NP      . buPROPion [facility]) tablet 75 mg  75 mg Oral Daily Jomarie Longs, MD   75 mg at 08/17/16 0809  . chlordiazePOXIDE (LIBRIUM) capsule 25 mg  25 mg Oral Q4H PRN Jomarie Longs, MD      . divalproex (DEPAKOTE ER) 24 hr tablet 750 mg  750 mg Oral Q2000 Claramae Rigdon, MD   750 mg at 08/16/16 2044  . hydrOXYzine  (ATARAX/VISTARIL) tablet 25 mg  25 mg Oral Q6H PRN Laveda Abbe, NP      . magnesium hydroxide (MILK OF MAGNESIA) suspension 30 mL  30 mL Oral Daily PRN Laveda Abbe, NP      . QUEtiapine (SEROQUEL) tablet 200 mg  200 mg Oral QHS Jomarie Longs, MD   200 mg at 08/16/16 2045  . QUEtiapine (SEROQUEL) tablet 50 mg  50 mg Oral TID PRN Jomarie Longs, MD      . traZODone (DESYREL) tablet 50 mg  50 mg Oral QHS PRN Laveda Abbe, NP        Lab Results:  Results for orders placed or performed during the hospital encounter of 08/12/16 (from the past 48 hour(s))  Valproic acid level     Status: None   Collection Time: 08/17/16  6:31 AM  Result Value Ref Range   Valproic Acid Lvl 63 50.0 - 100.0 ug/mL    Comment: Performed at [facility], [address], [place], [place] [address]    Blood Alcohol level:  Lab Results  Component Value Date   [facility] <5 08/11/2016   ETH <5 03/10/2016    Metabolic Disorder Labs: Lab Results  Component Value Date   HGBA1C 5.4 08/15/2016   MPG 108 08/15/2016   MPG 123 11/26/2015   Lab Results  Component Value Date   PROLACTIN 33.2 (H) 08/15/2016   Lab Results  Component Value Date   CHOL 158 08/15/2016   TRIG 122 08/15/2016   HDL 39 (L) 08/15/2016   CHOLHDL 4.1 08/15/2016   VLDL 24 08/15/2016   LDLCALC 95 08/15/2016   LDLCALC 169 (H) 11/26/2015    Physical Findings: AIMS: Facial and Oral Movements Muscles of Facial Expression: None, normal Lips and Perioral Area: None, normal Jaw: None, normal Tongue: None, normal,Extremity Movements Upper (arms, wrists, hands, fingers): None, normal Lower (legs, knees, ankles, toes): None, normal, Trunk Movements Neck, shoulders, hips: None, normal, Overall Severity Severity of abnormal movements (highest score from questions above): None, normal Incapacitation due to abnormal movements: None, normal Patient's awareness of abnormal movements (rate only patient's report):  No Awareness, Dental Status Current problems with teeth and/or dentures?: No Does patient usually wear dentures?: No  CIWA:  CIWA-Ar Total: 0 COWS:     Musculoskeletal: Strength & Muscle Tone: within normal limits Gait & Station: seen in bed Patient leans: N/A  Psychiatric Specialty Exam: Physical Exam  Nursing note and vitals reviewed.   Review of Systems  Psychiatric/Behavioral: Positive for depression and suicidal ideas.  All other systems reviewed and are negative.   Blood pressure 109/77, pulse 89, temperature 98.7 F (37.1 C), temperature source Oral, resp. rate 16, height 6' 3.25" (1.911 m), weight 115.2 kg (254 lb).Body mass index is 31.54 kg/m.  General Appearance: Guarded  Eye  Contact:  None  Speech:  Slow  Volume:  monosyllables  Mood:  Depressed and Dysphoric  Affect:  Constricted  Thought Process:  Linear and Descriptions of Associations: Intact  Orientation:  Other:  person, place  Thought Content:  Logical- denies AH - but per staff is observed as responding to internal stimuli  Suicidal Thoughts:  Yes.  without intent/plan, on and off , contracts for safety on the unit  Homicidal Thoughts:  No  Memory:  Immediate;   Fair Recent;   Fair Remote;   Fair  Judgement:  Fair  Insight:  Lacking  Psychomotor Activity:  Decreased  Concentration:  Concentration: Poor and Attention Span: Poor  Recall:  [employer] of Knowledge:  Fair  Language:  Good  Akathisia:  No  Handed:  Right  AIMS (if indicated):     Assets:  Communication Skills Desire for Improvement  ADL's:  Intact  Cognition:  WNL  Sleep:  Number of Hours: 6.75     Treatment Plan Summary:Patient seen in bed , minimal interaction , has on and off SI , although contracts for safety on the unit , is observed as responding to internal stimuli.   Schizoaffective disorder, depressive type (HCC) unstable Will continue today 08/17/16 plan as below except where it is noted.   Daily contact with  patient to assess and evaluate symptoms and progress in treatment, Medication management and Plan see below   For psychosis: Will increase  Seroquel to 225  mg po qhs, dose reduced previously due to drowsiness.  For Mood sx:  Depakote ER 750 mg po qhs to 8 pm  Depakote level therapeutic today - 08/17/16. Continue Wellbutrin 75 mg po daily for motivation.   For insomnia: Trazodone 50 mg po qhs prn.  For cannabis use disorder: Substance abuse counseling. Referral to substance abuse program.  For BZD abuse : CIWA/librium protocol. Substance abuse rehab referral .  Tobacco use disorder: Nicotine patch. Smoking cessation counseling provided.  Will continue to monitor vitals ,medication compliance and treatment side effects while patient is here.   Will monitor for medical issues as well as call consult as needed.   Reviewed labs- cbc - wnl, cmp - wnl, uds- THC - pos , bal <5  ,lipid panel- wnl  hba1c- wnl , pl- elevated - will need to be monitored.  Pending EKG for qtc monitoring.  CSW will continue working on disposition.   Patient to participate in therapeutic milieu  Abrar Bilton, MD 08/17/2016, 2:36 PM

## 2016-08-18 MED ORDER — TRAZODONE HCL 150 MG PO TABS
75.0000 mg | ORAL_TABLET | Freq: Every evening | ORAL | Status: DC | PRN
  Administered 2016-08-18: 75 mg via ORAL
  Filled 2016-08-18: qty 1

## 2016-08-18 MED ORDER — QUETIAPINE FUMARATE 50 MG PO TABS
275.0000 mg | ORAL_TABLET | Freq: Every day | ORAL | Status: DC
  Administered 2016-08-18: 22:00:00 275 mg via ORAL
  Filled 2016-08-18 (×2): qty 1

## 2016-08-18 NOTE — Progress Notes (Signed)
DAR NOTE: Patient presents with flat affect and depressed mood.  Denies pain, auditory and visual hallucinations.  Described  energy level as normal and concentration as good.  Rates depression at 3, hopelessness at 5, and anxiety at 7.  Reports withdrawal symptoms of chilling, cravings and irritability.  Maintained on routine safety checks.  Medications given as prescribed.  Support and encouragement offered as needed.  Attended group and participated.  States goal for today is "stay focus and maintain my discharge."  Patient was visible in milieu for therapy and activities.  Offered no complaint.

## 2016-08-18 NOTE — [facility] Group Notes (Signed)
[facility] LCSW Group Therapy  08/18/2016 2:39 PM   Type of Therapy:  Group Therapy   Participation Level:  Engaged  Participation Quality:  Attentive  Affect:  Appropriate   Cognitive:  Alert   Insight:  Engaged  Engagement in Therapy:  Improving   Modes of Intervention:  Education, Exploration, Socialization   Summary of Progress/Problems: [name] was engaged throughout and stayed entire time.   Onalee HuaDavid from [employer] was here to tell his story of recovery, inform patients about [facility] and play his guitar.   Baldo DaubJolan Aceyn Kathol 08/18/2016 2:39 PM

## 2016-08-18 NOTE — Progress Notes (Signed)
Recreation Therapy Notes  Date: 08/18/16 Time: 1000 Location: 500 Hall Dayroom  Group Topic: Leisure Education  Goal Area(s) Addresses:  Patient will identify positive leisure activities.  Patient will identify one positive benefit of participation in leisure activities.   Behavioral Response: Engaged  Intervention: Various activities on pieces of paper, dry erase marker, dry erase board, eraser  Activity: Leisure Pictionary.  Patients were to pick an activity from the can.  Patients were to then draw a picture of the activity on the board. The remaining patients were to try and guess what the picture was.  The first person to guess the picture correctly would then get a chance to go next.  Education:  Leisure Education, Building control surveyorDischarge Planning  Education Outcome: Acknowledges education/In group clarification offered/Needs additional education  Clinical Observations/Feedback: Pt sat at the back of the room twisting his hair.  Pt loosened up as group went on and became more active.  Pt even asked to draw the last picture for group.  Pt became more social and engaged with peers.  Pt was laughing and bright in group.   Caroll RancherMarjette Baylynn Shifflett, LRT/CTRS         Caroll RancherLindsay, Jaben Benegas A 08/18/2016 11:36 AM

## 2016-08-18 NOTE — Progress Notes (Signed)
[facility] MD Progress Note  08/18/2016 12:09 PM MATTTHEW ZIOMEK  MRN:  [id number] Subjective:  Pt states " I am fine , I still hear voices , I also did not sleep last night due to the voices.'     Objective;Patient seen and chart reviewed.Discussed patient with treatment team.  Pt today seen as withdrawn , when encouraged to get out of bed he did so , but continued to avoid eye contact , and sat the entire time with his head bowed down . Pt reports sleep issues last night , continues to have AH that are distressing. Pt advised to participate in milieu , attend groups . Pt continues to need encouragement and support.    Principal Problem: Schizoaffective disorder, depressive type (HCC) Diagnosis:   Patient Active Problem List   Diagnosis Date Noted  . Benzodiazepine abuse [F13.10] 08/13/2016  . Schizoaffective disorder, depressive type (HCC) [F25.1] 08/12/2016  . Schizoaffective disorder, bipolar type (HCC) [F25.0] 09/18/2014  . Tobacco use disorder [F17.200] 09/18/2014  . 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 Cannabis use disorder, severe, dependence (HCC) [F12.20] 09/17/2014   Total Time spent with patient: 25 minutes  Past Psychiatric History: Please see H&P.   Past Medical History:  Past Medical History:  Diagnosis Date  . Cannabis abuse   . Paranoid schizophrenia (HCC)   . Schizoaffective disorder, depressive type (HCC) 09/17/2014   History reviewed. No pertinent surgical history. Family History:  Family History  Problem Relation Age of Onset  . Mental illness Brother   . Drug abuse Brother   . Mental illness Cousin   . Suicidality Cousin   . Diabetes Maternal Grandmother    Family Psychiatric  History: Please see H&P.  Social History: Please see H&P.  History  Alcohol Use No     History  Drug Use  . Types: Marijuana    Social History   Social History  . Marital status: Single    Spouse name: N/A  . Number of children: N/A  . Years  of education: N/A   Social History Main Topics  . Smoking status: Never Smoker  . Smokeless tobacco: Never Used  . Alcohol use No  . Drug use: Yes    Types: Marijuana  . Sexual activity: No   Other Topics Concern  . None   Social History Narrative  . None   Additional Social History:    Pain Medications: SEE MAR Prescriptions: See MAR Over the Counter: SEE MAR History of alcohol / drug use?: Yes Longest period of sobriety (when/how long): unspecified Negative Consequences of Use: Financial, Legal, Personal relationships Withdrawal Symptoms: Patient aware of relationship between substance abuse and physical/medical complications Name of Substance 1: marijuana 1 - Age of First Use: unspecified 1 - Amount (size/oz): 1/8 th 1 - Frequency: daily 1 - Duration: years 1 - Last Use / Amount: yesterday Name of Substance 2: Benzos, Xanex 2 - Age of First Use: unspecified 2 - Amount (size/oz): 2-3 pills  (2mg -off the streets) 2 - Frequency: daily 2 - Duration: years 2 - Last Use / Amount: unknown Name of Substance 3: Alcohol  3 - Age of First Use: 13 3 - Amount (size/oz): 1/2 of a 5th of vodka 3 - Frequency: daily 3 - Duration: ongoing 3 - Last Use / Amount: 08/10/16              Sleep: Poor  Appetite:  Fair  Current Medications: Current Facility-Administered Medications  Medication Dose Route Frequency Provider Last  Rate Last Dose  . acetaminophen (TYLENOL) tablet 650 mg  650 mg Oral Q6H PRN Laveda AbbeLaurie Britton Parks, NP   650 mg at 08/14/16 1745  . alum & mag hydroxide-simeth (MAALOX/MYLANTA) 200-200-20 MG/5ML suspension 30 mL  30 mL Oral Q4H PRN Laveda AbbeLaurie Britton Parks, NP      . buPROPion [facility](WELLBUTRIN) tablet 75 mg  75 mg Oral Daily Jomarie LongsSaramma Marlane Hirschmann, MD   75 mg at 08/18/16 0800  . chlordiazePOXIDE (LIBRIUM) capsule 25 mg  25 mg Oral Q4H PRN Jomarie LongsSaramma Aryanna Shaver, MD      . divalproex (DEPAKOTE ER) 24 hr tablet 750 mg  750 mg Oral Q2000 Shira Bobst, MD   750 mg at 08/17/16 2117  .  hydrOXYzine (ATARAX/VISTARIL) tablet 25 mg  25 mg Oral Q6H PRN Laveda AbbeLaurie Britton Parks, NP      . magnesium hydroxide (MILK OF MAGNESIA) suspension 30 mL  30 mL Oral Daily PRN Laveda AbbeLaurie Britton Parks, NP      . QUEtiapine (SEROQUEL) tablet 275 mg  275 mg Oral QHS Trentan Trippe, MD      . QUEtiapine (SEROQUEL) tablet 50 mg  50 mg Oral TID PRN Jomarie LongsSaramma Emelee Rodocker, MD      . traZODone (DESYREL) tablet 75 mg  75 mg Oral QHS PRN Jomarie LongsSaramma Staphanie Harbison, MD        Lab Results:  Results for orders placed or performed during the hospital encounter of 08/12/16 (from the past 48 hour(s))  Valproic acid level     Status: None   Collection Time: 08/17/16  6:31 AM  Result Value Ref Range   Valproic Acid Lvl 63 50.0 - 100.0 ug/mL    Comment: Performed at [facility], [address] [place], [place] [id number]    Blood Alcohol level:  Lab Results  Component Value Date   [facility] Asc[facility] <5 08/11/2016   ETH <5 03/10/2016    Metabolic Disorder Labs: Lab Results  Component Value Date   HGBA1C 5.4 08/15/2016   MPG 108 08/15/2016   MPG 123 11/26/2015   Lab Results  Component Value Date   PROLACTIN 33.2 (H) 08/15/2016   Lab Results  Component Value Date   CHOL 158 08/15/2016   TRIG 122 08/15/2016   HDL 39 (L) 08/15/2016   CHOLHDL 4.1 08/15/2016   VLDL 24 08/15/2016   LDLCALC 95 08/15/2016   LDLCALC 169 (H) 11/26/2015    Physical Findings: AIMS: Facial and Oral Movements Muscles of Facial Expression: None, normal Lips and Perioral Area: None, normal Jaw: None, normal Tongue: None, normal,Extremity Movements Upper (arms, wrists, hands, fingers): None, normal Lower (legs, knees, ankles, toes): None, normal, Trunk Movements Neck, shoulders, hips: None, normal, Overall Severity Severity of abnormal movements (highest score from questions above): None, normal Incapacitation due to abnormal movements: None, normal Patient's awareness of abnormal movements (rate only patient's report): No Awareness,  Dental Status Current problems with teeth and/or dentures?: No Does patient usually wear dentures?: No  CIWA:  CIWA-Ar Total: 0 COWS:     Musculoskeletal: Strength & Muscle Tone: within normal limits Gait & Station: normal Patient leans: Front  Psychiatric Specialty Exam: Physical Exam  Nursing note and vitals reviewed.   Review of Systems  Psychiatric/Behavioral: Positive for depression and hallucinations. The patient is nervous/anxious.   All other systems reviewed and are negative.   Blood pressure 116/60, pulse 99, temperature 98.9 F (37.2 C), temperature source Oral, resp. rate 16, height 6' 3.25" (1.911 m), weight 115.2 kg (254 lb).Body mass index is 31.54 kg/m.  General Appearance: Guarded  Eye Contact:  Poor  Speech:  Slow  Volume:  Decreased  Mood:  Depressed and Dysphoric  Affect:  Constricted  Thought Process:  Linear and Descriptions of Associations: Intact  Orientation:  Other:  person, place  Thought Content:  Hallucinations: Auditory states voices disrupted his sleep last night , however did not elaborate on the voices   Suicidal Thoughts:  No  Homicidal Thoughts:  No  Memory:  Immediate;   Fair Recent;   Fair Remote;   Fair  Judgement:  Fair  Insight:  Lacking  Psychomotor Activity:  Decreased  Concentration:  Concentration: Poor and Attention Span: Poor  Recall:  [employer] of Knowledge:  Fair  Language:  Good  Akathisia:  No  Handed:  Right  AIMS (if indicated):     Assets:  Communication Skills Desire for Improvement  ADL's:  Intact  Cognition:  WNL  Sleep:  Number of Hours: 5.5     Treatment Plan Summary:Patient seen with poor eye contact , is observed as guarded , isolative and has sleep issues. Continue to encourage and make medication changes. Pt to be discharged to [facility] house once stable.    Schizoaffective disorder, depressive type (HCC) unstable Will continue today 08/18/16 plan as below except where it is noted.   Daily  contact with patient to assess and evaluate symptoms and progress in treatment, Medication management and Plan see below   For psychosis: Will increase  Seroquel to 275 mg po qhs, dose reduced previously due to drowsiness.  For Mood sx:  Depakote ER 750 mg po qhs to 8 pm  Depakote level therapeutic today - 08/17/16. Continue Wellbutrin 75 mg po daily for motivation.   For insomnia: Will change Trazodone to 75 mg po qhs prn.  For cannabis use disorder: Substance abuse counseling. Referral to substance abuse program.  For BZD abuse : CIWA/librium protocol. Substance abuse rehab referral .  Tobacco use disorder: Nicotine patch. Smoking cessation counseling provided.  Will continue to monitor vitals ,medication compliance and treatment side effects while patient is here.   Will monitor for medical issues as well as call consult as needed.   Reviewed labs- cbc - wnl, cmp - wnl, uds- THC - pos , bal <5  ,lipid panel- wnl  hba1c- wnl , pl- elevated - will need to be monitored.  Pending EKG for qtc monitoring.  CSW will continue working on disposition.   Patient to participate in therapeutic milieu  Previn Jian, MD 08/18/2016, 12:09 PM

## 2016-08-18 NOTE — Progress Notes (Signed)
Patient ID: Timothy [name], male   DOB: 05/15/1996, 21 y.o.   MRN: [id number] D: client in bed most of this shift, denies AVH, but appears to be preoccupied, affect sad. 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 "yeap" A: Writer provided emotional support, encouraged client to report any concerns. Medications reviewed, administered as ordered. Staff will monitor q9715min for safety. R: client is safe on the unit.

## 2016-08-18 NOTE — Tx Team (Signed)
Interdisciplinary Treatment and Diagnostic Plan Update  08/18/2016 Time of Session: 5:03 PM  Timothy [name] MRN: [id number]  Principal Diagnosis: Schizoaffective disorder, depressive type (HCC)  Secondary Diagnoses: Principal Problem:   Schizoaffective disorder, depressive type (HCC) Active Problems:   Cannabis use disorder, severe, dependence (HCC)   Tobacco use disorder   Neuroleptic-induced Parkinsonism (HCC)   Benzodiazepine abuse   Current Medications:  Current Facility-Administered Medications  Medication Dose Route Frequency Provider Last Rate Last Dose  . acetaminophen (TYLENOL) tablet 650 mg  650 mg Oral Q6H PRN Laveda Abbe, NP   650 mg at 08/14/16 1745  . alum & mag hydroxide-simeth (MAALOX/MYLANTA) 200-200-20 MG/5ML suspension 30 mL  30 mL Oral Q4H PRN Laveda Abbe, NP      . buPROPion [facility]) tablet 75 mg  75 mg Oral Daily Jomarie Longs, MD   75 mg at 08/18/16 0800  . chlordiazePOXIDE (LIBRIUM) capsule 25 mg  25 mg Oral Q4H PRN Jomarie Longs, MD      . divalproex (DEPAKOTE ER) 24 hr tablet 750 mg  750 mg Oral Q2000 Saramma Eappen, MD   750 mg at 08/17/16 2117  . hydrOXYzine (ATARAX/VISTARIL) tablet 25 mg  25 mg Oral Q6H PRN Laveda Abbe, NP      . magnesium hydroxide (MILK OF MAGNESIA) suspension 30 mL  30 mL Oral Daily PRN Laveda Abbe, NP      . QUEtiapine (SEROQUEL) tablet 275 mg  275 mg Oral QHS Saramma Eappen, MD      . QUEtiapine (SEROQUEL) tablet 50 mg  50 mg Oral TID PRN Jomarie Longs, MD      . traZODone (DESYREL) tablet 75 mg  75 mg Oral QHS PRN Jomarie Longs, MD        PTA Medications: Prescriptions Prior to Admission  Medication Sig Dispense Refill Last Dose  . benztropine (COGENTIN) 0.5 MG tablet Take 1 tablet (0.5 mg total) by mouth 2 (two) times daily. (Patient not taking: Reported on 08/12/2016) 60 tablet 0 Not Taking at Unknown time  . divalproex (DEPAKOTE) 250 MG DR tablet Take 3 tablets (750 mg total) by mouth  every evening. (Patient not taking: Reported on 08/12/2016) 30 tablet 0 Not Taking at Unknown time  . divalproex (DEPAKOTE) 500 MG DR tablet Take 1 tablet (500 mg total) by mouth daily. (Patient not taking: Reported on 08/12/2016) 30 tablet 0 Not Taking at Unknown time  . hydrOXYzine (ATARAX/VISTARIL) 25 MG tablet Take 1 tablet (25 mg total) by mouth every 6 (six) hours as needed for anxiety. (Patient not taking: Reported on 08/12/2016) 30 tablet 0 Not Taking at Unknown time  . nicotine (NICODERM CQ - DOSED IN MG/24 HOURS) 21 mg/24hr patch Place 1 patch (21 mg total) onto the skin daily. (Patient not taking: Reported on 08/12/2016) 28 patch 0 Not Taking at Unknown time  . QUEtiapine (SEROQUEL XR) 300 MG 24 hr tablet Take 2 tablets (600 mg total) by mouth at bedtime. (Patient not taking: Reported on 08/12/2016) 60 tablet 0 Not Taking at Unknown time  . traZODone (DESYREL) 50 MG tablet Take 1 tablet (50 mg total) by mouth at bedtime and may repeat dose one time if needed. (Patient not taking: Reported on 08/12/2016) 30 tablet 0 Not Taking at Unknown time    Treatment Modalities: Medication Management, Group therapy, Case management,  1 to 1 session with clinician, Psychoeducation, Recreational therapy.   Physician Treatment Plan for Primary Diagnosis: Schizoaffective disorder, depressive type (HCC) Long Term Goal(s): Improvement  in symptoms so as ready for discharge  Short Term Goals: Compliance with prescribed medications will improve    Medication Management: Evaluate patient's response, side effects, and tolerance of medication regimen.  Therapeutic Interventions: 1 to 1 sessions, Unit Group sessions and Medication administration.  Evaluation of Outcomes: Adequate for Discharge  Physician Treatment Plan for Secondary Diagnosis: Principal Problem:   Schizoaffective disorder, depressive type (HCC) Active Problems:   Cannabis use disorder, severe, dependence (HCC)   Tobacco use disorder    Neuroleptic-induced Parkinsonism (HCC)   Benzodiazepine abuse   Long Term Goal(s): Improvement in symptoms so as ready for discharge  Short Term Goals: Ability to identify triggers associated with substance abuse/mental health issues will improve  Medication Management: Evaluate patient's response, side effects, and tolerance of medication regimen.  Therapeutic Interventions: 1 to 1 sessions, Unit Group sessions and Medication administration.  Evaluation of Outcomes: Adequate for Discharge   RN Treatment Plan for Primary Diagnosis: Schizoaffective disorder, depressive type (HCC) Long Term Goal(s): Knowledge of disease and therapeutic regimen to maintain health will improve  Short Term Goals: Ability to disclose and discuss suicidal ideas and Compliance with prescribed medications will improve  Medication Management: RN will administer medications as ordered by provider, will assess and evaluate patient's response and provide education to patient for prescribed medication. RN will report any adverse and/or side effects to prescribing provider.  Therapeutic Interventions: 1 on 1 counseling sessions, Psychoeducation, Medication administration, Evaluate responses to treatment, Monitor vital signs and CBGs as ordered, Perform/monitor CIWA, COWS, AIMS and Fall Risk screenings as ordered, Perform wound care treatments as ordered.  Evaluation of Outcomes: Adequate for Discharge   LCSW Treatment Plan for Primary Diagnosis: Schizoaffective disorder, depressive type (HCC) Long Term Goal(s): Safe transition to appropriate next level of care at discharge, Engage patient in therapeutic group addressing interpersonal concerns.  Short Term Goals: Engage patient in aftercare planning with referrals and resources and Increase skills for wellness and recovery  Therapeutic Interventions: Assess for all discharge needs, 1 to 1 time with Social worker, Explore available resources and support systems,  Assess for adequacy in community support network, Educate family and significant other(s) on suicide prevention, Complete Psychosocial Assessment, Interpersonal group therapy.  Evaluation of Outcomes: Adequate for Discharge   Progress in Treatment: Attending groups: Yes Participating in groups: Yes Taking medication as prescribed: Yes, MD continues to assess for medication changes as needed Toleration medication: Yes, no side effects reported at this time Family/Significant other contact made: Yes Patient understands diagnosis: Limited insight  Discussing patient identified problems/goals with staff: Yes Medical problems stabilized or resolved: Yes Denies suicidal/homicidal ideation: No  Issues/concerns per patient self-inventory: None Other: N/A  New problem(s) identified: None identified at this time.   New Short Term/Long Term Goal(s): None identified at this time.   Discharge Plan or Barriers: [facility] and [facility]  Reason for Continuation of Hospitalization:  Estimated Length of Stay: Likely d/c tomorrow  Attendees: Patient: 08/18/2016  5:03 PM  Physician: Dr. Elna BreslowEappen 08/18/2016  5:03 PM  Nursing: Antoine Primasan McCool, RN  08/18/2016  5:03 PM  RN Care Manager: Onnie BoerJennifer Clark 08/18/2016  5:03 PM  Social Worker: Richelle Itood Roxy Mastandrea, LCSW 08/18/2016  5:03 PM  Recreational Therapist: Aggie CosierMarjette Lindsey 08/18/2016  5:03 PM  Other: Baldo DaubJolan Williams, Social Work Intern  08/18/2016  5:03 PM  Other:  08/18/2016  5:03 PM  Other: 08/18/2016  5:03 PM    Scribe for Treatment Team: Baldo DaubJolan Williams, Social Work Intern 08/18/2016 5:03  PM

## 2016-08-19 MED ORDER — QUETIAPINE FUMARATE 50 MG PO TABS
350.0000 mg | ORAL_TABLET | Freq: Every day | ORAL | Status: DC
  Administered 2016-08-19: 21:00:00 350 mg via ORAL
  Filled 2016-08-19 (×2): qty 1

## 2016-08-19 MED ORDER — BUPROPION HCL 100 MG PO TABS
100.0000 mg | ORAL_TABLET | Freq: Every day | ORAL | Status: DC
  Administered 2016-08-20: 100 mg via ORAL
  Filled 2016-08-19: qty 7
  Filled 2016-08-19 (×2): qty 1

## 2016-08-19 MED ORDER — TRAZODONE HCL 100 MG PO TABS
100.0000 mg | ORAL_TABLET | Freq: Every evening | ORAL | Status: DC | PRN
  Filled 2016-08-19: qty 7

## 2016-08-19 NOTE — [facility] Group Notes (Signed)
[facility] Group Notes:  (Counselor/Nursing/MHT/Case Management/Adjunct)  08/19/2016 1:15PM  Type of Therapy:  Group Therapy  Participation Level:  Active  Participation Quality:  Appropriate  Affect:  Flat  Cognitive:  Oriented  Insight:  Improving  Engagement in Group:  Limited  Engagement in Therapy:  Limited  Modes of Intervention:  Discussion, Exploration and Socialization  Summary of Progress/Problems: The topic for group was balance in life.  Pt participated in the discussion about when their life was in balance and out of balance and how this feels.  Pt discussed ways to get back in balance and short term goals they can work on to get where they want to be. I feel like I am balanced because I am leaving tomorrow."  Went on to talk about how having support from family and working out in the gym are also ways of  gainig emotional balance.  Also talked, rather longingly, about how he feels like a relationship with a young woman is something that would help him with emotional balance.    Timothy Geraldorth, Timothy [name] 08/19/2016 1:06 PM

## 2016-08-19 NOTE — Progress Notes (Signed)
Patient ID: Timothy [name], male   DOB: 10-Nov-1995, 21 y.o.   MRN: [id number] D: Client visible on the unit, seen in dayroom watching TV. Client quiet, smiles when talking to writer, reports he will be leaving tomorrow and okay with this. Client reports goals have been met. A: Writer provided emotional support encouraged him to report any concerns. Medications reviewed, administered as ordered. Staff will monitor q42mn for safety. R: client is safe on the unit, attended group.

## 2016-08-19 NOTE — Progress Notes (Signed)
DAR NOTE: Patient presents with flat affect and depressed mood.  Denies pain, auditory and visual hallucinations.  described energy level as normal and concentration as good.  Reports suicidal thoughts on self inventory form but verbally contracts for safety.  Reports withdrawal symptoms of chilling, cravings, and irritability on self inventory form.  Rates depression at 2, hopelessness at 5, and anxiety at 8.  Maintained on routine safety checks.  Medications given as prescribed.  Support and encouragement offered as needed.  Attended group and participated.  States goal for today is "get out and get better and start a chapter in my life."  Patient remained withdrawn and isolative. Offered no complaint.

## 2016-08-19 NOTE — Progress Notes (Signed)
[facility] MD Progress Note  08/19/2016 10:33 AM Timothy [name]  MRN:  [id number] Subjective:  Pt states " I still did not sleep, the voices were still keeping me awake.'      Objective;Patient seen and chart reviewed.Discussed patient with treatment team.  Pt today seen as isolative , withdrawn , poor eye contact , continues to whisper when spoken to . He appears passive , needs encouragement to participate in milieu. Pt continues to stay in bed until lunch time , which in turn affects his sleep at night. Pt continues to endorse AH at night that disrupts his sleep. Pt per staff is seen in groups on and off , however continues to need a lot of support. Pt can return to [facility] house at discharge .     Principal Problem: Schizoaffective disorder, depressive type (HCC) Diagnosis:   Patient Active Problem List   Diagnosis Date Noted  . Benzodiazepine abuse [F13.10] 08/13/2016  . Schizoaffective disorder, depressive type (HCC) [F25.1] 08/12/2016  . Schizoaffective disorder, bipolar type (HCC) [F25.0] 09/18/2014  . Tobacco use disorder [F17.200] 09/18/2014  . 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 Cannabis use disorder, severe, dependence (HCC) [F12.20] 09/17/2014   Total Time spent with patient: 25 minutes  Past Psychiatric History: Please see H&P.   Past Medical History:  Past Medical History:  Diagnosis Date  . Cannabis abuse   . Paranoid schizophrenia (HCC)   . Schizoaffective disorder, depressive type (HCC) 09/17/2014   History reviewed. No pertinent surgical history. Family History:  Family History  Problem Relation Age of Onset  . Mental illness Brother   . Drug abuse Brother   . Mental illness Cousin   . Suicidality Cousin   . Diabetes Maternal Grandmother    Family Psychiatric  History: Please see H&P.  Social History: Please see H&P.  History  Alcohol Use No     History  Drug Use  . Types: Marijuana    Social History   Social  History  . Marital status: Single    Spouse name: N/A  . Number of children: N/A  . Years of education: N/A   Social History Main Topics  . Smoking status: Never Smoker  . Smokeless tobacco: Never Used  . Alcohol use No  . Drug use: Yes    Types: Marijuana  . Sexual activity: No   Other Topics Concern  . None   Social History Narrative  . None   Additional Social History:    Pain Medications: SEE MAR Prescriptions: See MAR Over the Counter: SEE MAR History of alcohol / drug use?: Yes Longest period of sobriety (when/how long): unspecified Negative Consequences of Use: Financial, Legal, Personal relationships Withdrawal Symptoms: Patient aware of relationship between substance abuse and physical/medical complications Name of Substance 1: marijuana 1 - Age of First Use: unspecified 1 - Amount (size/oz): 1/8 th 1 - Frequency: daily 1 - Duration: years 1 - Last Use / Amount: yesterday Name of Substance 2: Benzos, Xanex 2 - Age of First Use: unspecified 2 - Amount (size/oz): 2-3 pills  (2mg -off the streets) 2 - Frequency: daily 2 - Duration: years 2 - Last Use / Amount: unknown Name of Substance 3: Alcohol  3 - Age of First Use: 13 3 - Amount (size/oz): 1/2 of a 5th of vodka 3 - Frequency: daily 3 - Duration: ongoing 3 - Last Use / Amount: 08/10/16              Sleep: Poor  Appetite:  Fair  Current Medications: Current Facility-Administered Medications  Medication Dose Route Frequency Provider Last Rate Last Dose  . acetaminophen (TYLENOL) tablet 650 mg  650 mg Oral Q6H PRN Laveda Abbe, NP   650 mg at 08/14/16 1745  . alum & mag hydroxide-simeth (MAALOX/MYLANTA) 200-200-20 MG/5ML suspension 30 mL  30 mL Oral Q4H PRN Laveda Abbe, NP      . Melene Muller ON 08/20/2016] [facility]) tablet 100 mg  100 mg Oral Daily Othell Diluzio, MD      . divalproex (DEPAKOTE ER) 24 hr tablet 750 mg  750 mg Oral Q2000 Din Bookwalter, MD   750 mg at 08/18/16  2150  . hydrOXYzine (ATARAX/VISTARIL) tablet 25 mg  25 mg Oral Q6H PRN Laveda Abbe, NP      . magnesium hydroxide (MILK OF MAGNESIA) suspension 30 mL  30 mL Oral Daily PRN Laveda Abbe, NP      . QUEtiapine (SEROQUEL) tablet 350 mg  350 mg Oral QHS Srikar Chiang, MD      . QUEtiapine (SEROQUEL) tablet 50 mg  50 mg Oral TID PRN Jomarie Longs, MD      . traZODone (DESYREL) tablet 100 mg  100 mg Oral QHS PRN Jomarie Longs, MD        Lab Results:  No results found for this or any previous visit (from the past 48 hour(s)).  Blood Alcohol level:  Lab Results  Component Value Date   ETH <5 08/11/2016   ETH <5 03/10/2016    Metabolic Disorder Labs: Lab Results  Component Value Date   HGBA1C 5.4 08/15/2016   MPG 108 08/15/2016   MPG 123 11/26/2015   Lab Results  Component Value Date   PROLACTIN 33.2 (H) 08/15/2016   Lab Results  Component Value Date   CHOL 158 08/15/2016   TRIG 122 08/15/2016   HDL 39 (L) 08/15/2016   CHOLHDL 4.1 08/15/2016   VLDL 24 08/15/2016   LDLCALC 95 08/15/2016   LDLCALC 169 (H) 11/26/2015    Physical Findings: AIMS: Facial and Oral Movements Muscles of Facial Expression: None, normal Lips and Perioral Area: None, normal Jaw: None, normal Tongue: None, normal,Extremity Movements Upper (arms, wrists, hands, fingers): None, normal Lower (legs, knees, ankles, toes): None, normal, Trunk Movements Neck, shoulders, hips: None, normal, Overall Severity Severity of abnormal movements (highest score from questions above): None, normal Incapacitation due to abnormal movements: None, normal Patient's awareness of abnormal movements (rate only patient's report): No Awareness, Dental Status Current problems with teeth and/or dentures?: No Does patient usually wear dentures?: No  CIWA:  CIWA-Ar Total: 0 COWS:     Musculoskeletal: Strength & Muscle Tone: within normal limits Gait & Station: normal Patient leans: Front  Psychiatric  Specialty Exam: Physical Exam  Nursing note and vitals reviewed.   Review of Systems  Psychiatric/Behavioral: Positive for depression, hallucinations and substance abuse. The patient is nervous/anxious and has insomnia.   All other systems reviewed and are negative.   Blood pressure 114/67, pulse (!) 110, temperature 98.4 F (36.9 C), temperature source Oral, resp. rate 20, height 6' 3.25" (1.911 m), weight 115.2 kg (254 lb).Body mass index is 31.54 kg/m.  General Appearance: Guarded  Eye Contact:  Poor  Speech:  Slow  Volume:  Decreased  Mood:  Depressed and Dysphoric  Affect:  Constricted  Thought Process:  Linear and Descriptions of Associations: Intact  Orientation:  Other:  person, place  Thought Content:  Hallucinations: Auditory - reports continued AH at  night that disrupts his sleep  Suicidal Thoughts:  No  Homicidal Thoughts:  No  Memory:  Immediate;   Fair Recent;   Fair Remote;   Fair  Judgement:  Fair  Insight:  Lacking  Psychomotor Activity:  Decreased  Concentration:  Concentration: Fair and Attention Span: Fair  Recall:  FiservFair  Fund of Knowledge:  Fair  Language:  Good  Akathisia:  No  Handed:  Right  AIMS (if indicated):     Assets:  Communication Skills Desire for Improvement  ADL's:  Intact  Cognition:  WNL  Sleep:  Number of Hours: 6.25     Treatment Plan Summary:Patient seen with a depressed affect , appears guarded , poor eye contact , speaks in monosyllables , continues to need medication changes . [facility]. Schizoaffective disorder, depressive type (HCC) unstable- improving Will continue today 08/19/16 plan as below except where it is noted.   Daily contact with patient to assess and evaluate symptoms and progress in treatment, Medication management and Plan see below   For psychosis: Will increase  Seroquel to 350 mg po qhs, dose reduced previously due to drowsiness.  For Mood sx:  Depakote ER 750 mg po qhs  to 8 pm  Depakote level therapeutic - 08/17/16. Increase Wellbutrin to 100 mg po daily for motivation.   For insomnia: Will change Trazodone to 100 mg po qhs prn.  For cannabis use disorder: Substance abuse counseling. Referral to substance abuse program.  For BZD abuse : CIWA/librium protocol. Substance abuse rehab referral .  Tobacco use disorder: Nicotine patch. Smoking cessation counseling provided.  Will continue to monitor vitals ,medication compliance and treatment side effects while patient is here.   Will monitor for medical issues as well as call consult as needed.   Reviewed labs- cbc - wnl, cmp - wnl, uds- THC - pos , bal <5  ,lipid panel- wnl  hba1c- wnl , pl- elevated - will need to be monitored.  Reviewed EKG for qtc monitoring- EKG: qtc - wnl, normal EKG, normal sinus rhythm.   CSW will continue working on disposition.   Patient to participate in therapeutic milieu  Juanice Warburton, MD 08/19/2016, 10:33 AM

## 2016-08-19 NOTE — Progress Notes (Signed)
Recreation Therapy Notes  Date: 08/19/16 Time: 1000 Location: 500 Hall Dayroom  Group Topic: Coping Skills  Goal Area(s) Addresses:  Patient will be able to identify positive coping skills. Patient will be able to identify benefits of using coping skills post d/c.  Behavioral Response: Engaged  Intervention: Magazines, scissors, glue sticks, worksheets, Holiday representativeconstruction paper  Activity:  PharmacologistCoping Skills.  Patients were given a worksheet divided into five sections (diversions, social, cognitive, tension releasers and physical).  Patients were to locate coping skills for each area in the magazines provided.  Patients were to then glue the coping skill to the corresponding areas.  Education:Coping Skills, Discharge Planning.   Education Outcome: Acknowledges understanding/In group clarification offered/Needs additional education.   Clinical Observations/Feedback: Pt identified his coping skills as: diversions- women; social- food, exercise; cognitive- money, food; tension releasers- boxing, sports, swimming and physical- vitamins and basketball.   Caroll RancherMarjette Ramiya Delahunty, LRT/CTRS        Caroll RancherLindsay, Bryley Chrisman A 08/19/2016 12:20 PM

## 2016-08-20 MED ORDER — TRAZODONE HCL 100 MG PO TABS
100.0000 mg | ORAL_TABLET | Freq: Every evening | ORAL | 0 refills | Status: DC | PRN
Start: 2016-08-20 — End: 2019-11-16

## 2016-08-20 MED ORDER — DIVALPROEX SODIUM ER 250 MG PO TB24: 750.0000 mg | ORAL_TABLET | Freq: Every day | ORAL | 0 refills | Status: DC

## 2016-08-20 MED ORDER — BUPROPION HCL 100 MG PO TABS: 100.0000 mg | ORAL_TABLET | Freq: Every day | ORAL | 0 refills | Status: DC

## 2016-08-20 MED ORDER — QUETIAPINE FUMARATE 100 MG PO TABS
350.0000 mg | ORAL_TABLET | Freq: Every day | ORAL | Status: DC
  Filled 2016-08-20: qty 25

## 2016-08-20 MED ORDER — QUETIAPINE FUMARATE 50 MG PO TABS: 350.0000 mg | ORAL_TABLET | Freq: Every day | ORAL | 0 refills | Status: DC

## 2016-08-20 MED ORDER — HYDROXYZINE HCL 25 MG PO TABS: 25.0000 mg | ORAL_TABLET | Freq: Four times a day (QID) | ORAL | 0 refills | Status: DC | PRN

## 2016-08-20 NOTE — Progress Notes (Signed)
  [facility] Adult Case Management Discharge Plan :  Will you be returning to the same living situation after discharge:  Yes,  [name] At discharge, do you have transportation home?: Yes,  grandmother Do you have the ability to pay for your medications: Yes,  mental health  Release of information consent forms completed and in the chart;  Patient's signature needed at discharge.  Patient to Follow up at: Follow-up Information    [facility] Follow up.   Specialty:  Behavioral Health Why:  Wednesday August 18, 2016, 9:30 am   Since we did not call to reschedule this before the appointment, they would not give us another time..  You will need to go to the [facility] at 8AM within 5 days of d/c for your hospital follow up appointment  Contact information: [address] [place] [address] [phone]           Next level of care provider [facility] Link:no  Safety Planning and Suicide Prevention discussed: Yes,  yes  Have you used any form of tobacco in the last 30 days? (Cigarettes, Smokeless Tobacco, Cigars, and/or Pipes): Yes  Has patient been referred to the Quitline?: Patient refused referral  Patient has been referred for addiction treatment: Yes  Timothy [name] 08/20/2016, 12:04 PM

## 2016-08-20 NOTE — Progress Notes (Signed)
Patient discharged to lobby. Patient was stable and appreciative at that time. All papers, samples and prescriptions were given and valuables returned. Verbal understanding expressed. Denies SI/HI and A/VH. Patient given opportunity to express concerns and ask questions.  

## 2016-08-20 NOTE — Progress Notes (Signed)
Recreation Therapy Notes  Date: 08/20/16 Time: 1000 Location: 500 Hall Dayroom   Group Topic: Communication, Team Building, Problem Solving  Goal Area(s) Addresses:  Patient will effectively work with peer towards shared goal.  Patient will identify skill used to make activity successful.  Patient will identify how skills used during activity can be used to reach post d/c goals.   Behavioral Response: Minimal  Intervention: STEM Activity   Activity: [place] [place]. Patients were provided the following materials: 5 drinking straws, 5 rubber bands, 5 paper clips, 2 index cards, 2 drinking cups, and 2 toilet paper rolls. Using the provided materials patients were asked to build a launching mechanisms to launch a ping pong ball approximately 12 feet. Patients were divided into teams of 3-5.   Education: Pharmacist, communityocial Skills, Building control surveyorDischarge Planning.   Education Outcome: Acknowledges education/In group clarification offered/Needs additional education.   Clinical Observations/Feedback: Pt stated the skills from this activity will "help you with teamwork and with kids".  Pt was more observant but would help when given something to do by his group.    Caroll RancherMarjette Shyane Fossum, LRT/CTRS      Lillia AbedLindsay, Nyella Eckels A 08/20/2016 11:07 AM

## 2016-08-20 NOTE — Plan of Care (Signed)
Problem: Safety: Goal: Ability to remain free from injury will improve Outcome: Adequate for Discharge Ability to remain free from injury will improve AEB CIWA 0 and [id number] safety checks.
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
## 2016-08-20 NOTE — Plan of Care (Signed)
Problem: [facility] Participation in Recreation Therapeutic Interventions Goal: STG-Patient will identify at least five coping skills for ** STG: Coping Skills - Patient will be able to identify at least 5 coping skills for suicidal thoughts by conclusion of recreation therapy tx  Outcome: Completed/Met Date Met: 08/20/16 Pt was able to identify coping skills at the completion of coping skills and leisure education recreation therapy sessions.  Victorino Sparrow, LRT/CTRS

## 2016-08-20 NOTE — [facility] Suicide Risk Assessment (Signed)
[facility]   Principal Problem: Schizoaffective disorder, depressive type [facility](HCC) Discharge Diagnoses:  Patient Active Problem List   Diagnosis Date Noted  . Benzodiazepine abuse [F13.10] 08/13/2016  . Schizoaffective disorder, depressive type (HCC) [F25.1] 08/12/2016  . Schizoaffective disorder, bipolar type (HCC) [F25.0] 09/18/2014  . Tobacco use disorder [F17.200] 09/18/2014  . 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 Cannabis use disorder, severe, dependence (HCC) [F12.20] 09/17/2014    Total Time spent with patient: 30 minutes  Musculoskeletal: Strength & Muscle Tone: within normal limits Gait & Station: normal Patient leans: N/A  Psychiatric Specialty Exam: Review of Systems  Psychiatric/Behavioral: Positive for substance abuse. Negative for depression, hallucinations and suicidal ideas. The patient is not nervous/anxious.   All other systems reviewed and are negative.   Blood pressure (!) 103/51, pulse 76, temperature 98.3 F (36.8 C), temperature source Oral, resp. rate 16, height 6' 3.25" (1.911 m), weight 115.2 kg (254 lb).Body mass index is 31.54 kg/m.  General Appearance: Casual  Eye Contact::  Fair  Speech:  Clear and Coherent409  Volume:  Normal  Mood:  Euthymic  Affect:  Appropriate  Thought Process:  Goal Directed and Descriptions of Associations: Intact  Orientation:  Full (Time, Place, and Person)  Thought Content:  Logical  Suicidal Thoughts:  No  Homicidal Thoughts:  No  Memory:  Immediate;   Fair Recent;   Fair Remote;   Fair  Judgement:  Fair  Insight:  Fair  Psychomotor Activity:  Normal  Concentration:  Fair  Recall:  Fis[employer]Fair  Fund of Knowledge:Fair  Language: Fair  Akathisia:  No  Handed:  Right  AIMS (if indicated):   0  Assets:  Desire for Improvement  Sleep:  Number of Hours: 6.75  Cognition: WNL  ADL's:  Intact   Mental Status Per Nursing Assessment::   On Admission:  Self-harm  thoughts  Demographic Factors:  Male  Loss Factors: NA  Historical Factors: Impulsivity  Risk Reduction Factors:   Positive social support and Positive therapeutic relationship  Continued Clinical Symptoms:  Alcohol/Substance Abuse/Dependencies Previous Psychiatric Diagnoses and Treatments  Cognitive Features That Contribute To Risk:  None    Suicide Risk:  Minimal: No identifiable suicidal ideation.  Patients presenting with no risk factors but with morbid ruminations; may be classified as minimal risk based on the severity of the depressive symptoms  [facility]   Specialty:  Behavioral Health Why:  Wednesday August 18, 2016, 9:30 am   Since we did not call to reschedule this before the appointment, they would not give us another time..  You will need [facility] at 8AM within 5 days of d/c for your hospital follow up appointment  Contact information: [address] [place] [address] [phone]           Plan Of Care/Follow-up recommendations:  Activity:  no restrictions Diet:  regular Tests:  as needed Other:  follow up with outpatient provider  Rosalia Mcavoy, MD 08/20/2016, 9:16 AM

## 2017-03-10 ENCOUNTER — Encounter (HOSPITAL_COMMUNITY): Payer: Self-pay | Admitting: *Deleted

## 2017-03-10 ENCOUNTER — Emergency Department (HOSPITAL_COMMUNITY)
Admission: EM | Admit: 2017-03-10 | Discharge: 2017-03-10 | Disposition: A | Discharge status: Home / Self Care | Payer: Self-pay | Attending: Emergency Medicine | Admitting: Emergency Medicine

## 2017-03-10 DIAGNOSIS — J020 Streptococcal pharyngitis: Secondary | ICD-10-CM | POA: Insufficient documentation

## 2017-03-10 DIAGNOSIS — R5381 Other malaise: Secondary | ICD-10-CM | POA: Insufficient documentation

## 2017-03-10 DIAGNOSIS — Z79899 Other long term (current) drug therapy: Secondary | ICD-10-CM | POA: Insufficient documentation

## 2017-03-10 LAB — RAPID STREP SCREEN (MED CTR MEBANE ONLY): Streptococcus, Group A Screen (Direct): POSITIVE — AB

## 2017-03-10 MED ORDER — CEPHALEXIN 500 MG PO CAPS
ORAL_CAPSULE | ORAL | 0 refills | Status: DC
Start: 2017-03-10 — End: 2019-11-16

## 2017-03-10 NOTE — ED Triage Notes (Signed)
Pt states that he has had a sore throat with painful swallowing for 1 week.

## 2017-03-10 NOTE — ED Provider Notes (Signed)
[facility]-EMERGENCY DEPT Provider Note   CSN: [id number] Arrival date & time: 03/10/17  0745     History   Chief Complaint Chief Complaint  Patient presents with  . Sore Throat    HPI Timothy [name] is a 21 y.o. male who presents emergency Department with chief complaint of sore throat 1 week. Patient states that thinking he has had some difficulty swallowing due to pain but denies any inability swallowing liquids, change in voice, fevers. He has some generalized malaise. Denies any signs or symptoms of dehydration. He has used Motrin, Tylenol and sore throat spray without significant relief of symptoms.  HPI  Past Medical History:  Diagnosis Date  . Cannabis abuse   . Paranoid schizophrenia (HCC)   . Schizoaffective disorder, depressive type (HCC) 09/17/2014    Patient Active Problem List   Diagnosis Date Noted  . Benzodiazepine abuse 08/13/2016  . Schizoaffective disorder, depressive type (HCC) 08/12/2016  . Schizoaffective disorder, bipolar type (HCC) 09/18/2014  . Tobacco use disorder 09/18/2014  . 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 Cannabis use disorder, severe, dependence (HCC) 09/17/2014    History reviewed. No pertinent surgical history.     Home Medications    Prior to Admission medications   Medication Sig Start Date End Date Taking? Authorizing Provider  buPROPion (WELLBUTRIN) 100 MG tablet Take 1 tablet (100 mg total) by mouth daily. For depression 08/21/16   Armandina StammerNwoko, Agnes I, NP  divalproex (DEPAKOTE ER) 250 MG 24 hr tablet Take 3 tablets (750 mg total) by mouth daily at 8 pm. For mood stabilization 08/20/16   Armandina StammerNwoko, Agnes I, NP  hydrOXYzine (ATARAX/VISTARIL) 25 MG tablet Take 1 tablet (25 mg total) by mouth every 6 (six) hours as needed for anxiety. 08/20/16   Armandina StammerNwoko, Agnes I, NP  QUEtiapine (SEROQUEL) 50 MG tablet Take 7 tablets (350 mg total) by mouth at bedtime. For mood control 08/20/16   Armandina StammerNwoko, Agnes I, NP  traZODone (DESYREL) 100 MG tablet  Take 1 tablet (100 mg total) by mouth at bedtime as needed for sleep. 08/20/16   Sanjuana KavaNwoko, Agnes I, NP    Family History Family History  Problem Relation Age of Onset  . Mental illness Brother   . Drug abuse Brother   . Mental illness Cousin   . Suicidality Cousin   . Diabetes Maternal Grandmother     Social History Social History  Substance Use Topics  . Smoking status: Never Smoker  . Smokeless tobacco: Never Used  . Alcohol use No     Allergies   Haldol [haloperidol lactate]; Abilify [aripiprazole]; Penicillins; and Zyprexa [olanzapine]   Review of Systems Review of Systems  Constitutional: Positive for fatigue.  HENT: Positive for sore throat.   Respiratory: Negative for stridor.      Physical Exam Updated Vital Signs BP 132/82 (BP Location: Right Arm)   Pulse 72   Temp 98.9 F (37.2 C) (Oral)   Resp 16   Ht 6\' 4"  (1.93 m)   Wt 124.7 kg (275 lb)   SpO2 98%   BMI 33.47 kg/m   Physical Exam  Constitutional: He appears well-developed and well-nourished. No distress.  HENT:  Head: Normocephalic and atraumatic.  Oropharyngeal erythema without exudates, uvula midline  Eyes: Conjunctivae are normal. No scleral icterus.  Neck: Normal range of motion. Neck supple.  Cardiovascular: Normal rate, regular rhythm and normal heart sounds.   Pulmonary/Chest: Effort normal and breath sounds normal. No respiratory distress.  Abdominal: Soft. There is no tenderness.  Musculoskeletal:  He exhibits no edema.  Neurological: He is alert.  Skin: Skin is warm and dry. He is not diaphoretic.  Psychiatric: His behavior is normal.  Nursing note and vitals reviewed.    ED Treatments / Results  Labs (all labs ordered are listed, but only abnormal results are displayed) Labs Reviewed  RAPID STREP SCREEN (NOT AT [facility]) - Abnormal; Notable for the following:       Result Value   Streptococcus, Group A Screen (Direct) POSITIVE (*)    All other components within normal limits       EKG  EKG Interpretation None       Radiology No results found.  Procedures Procedures (including critical care time)  Medications Ordered in ED Medications - No data to display   Initial Impression / Assessment and Plan / ED Course  I have reviewed the triage vital signs and the nursing notes.  Pertinent labs & imaging results that were available during my care of the patient were reviewed by me and considered in my medical decision making (see chart for details).     Pt febrile with tonsillar exudate, cervical lymphadenopathy, & dysphagia; diagnosis of strep.  Presentation non concerning for PTA or infxn spread to soft tissue. No trismus or uvula deviation. Specific return precautions discussed. Pt able to drink water in ED without difficulty with intact air way. Recommended PCP follow up.   Final Clinical Impressions(s) / ED Diagnoses   Final diagnoses:  Strep pharyngitis    New Prescriptions New Prescriptions   No medications on file     Arthor Captain, PA-C 03/10/17 0919    Lavera Guise, MD 03/11/17 [id number]

## 2017-03-10 NOTE — Discharge Instructions (Signed)
You or Your child has strep throat. Use the amoxicillin  for 10 full days. It is very important that  you or your child complete the entire course of this medication or the strep may not completely be treated.  Also discard  you or your child's toothbrush and begin using a new one in 3 days. For sore throat, take ibuprofen or tylenol every 6hr as needed. Follow up with your doctor in 2-3 days if no improvement. Return to the ED sooner for worsening condition, inability to swallow, breathing difficulty, new concerns.

## 2019-10-15 ENCOUNTER — Emergency Department: Payer: Self-pay

## 2019-10-15 ENCOUNTER — Emergency Department
Admission: EM | Admit: 2019-10-15 | Discharge: 2019-10-15 | Disposition: A | Discharge status: Home / Self Care | Payer: Self-pay | Attending: Emergency Medicine | Admitting: Emergency Medicine

## 2019-10-15 ENCOUNTER — Other Ambulatory Visit: Payer: Self-pay

## 2019-10-15 DIAGNOSIS — R400 Somnolence: Secondary | ICD-10-CM | POA: Insufficient documentation

## 2019-10-15 DIAGNOSIS — Z79899 Other long term (current) drug therapy: Secondary | ICD-10-CM | POA: Insufficient documentation

## 2019-10-15 DIAGNOSIS — R55 Syncope and collapse: Secondary | ICD-10-CM | POA: Insufficient documentation

## 2019-10-15 DIAGNOSIS — R42 Dizziness and giddiness: Secondary | ICD-10-CM | POA: Insufficient documentation

## 2019-10-15 LAB — BASIC METABOLIC PANEL
Anion gap: 8 (ref 5–15)
BUN: 20 mg/dL (ref 6–20)
CO2: 26 mmol/L (ref 22–32)
Calcium: 8.8 mg/dL — ABNORMAL LOW (ref 8.9–10.3)
Chloride: 104 mmol/L (ref 98–111)
Creatinine, Ser: 1.18 mg/dL (ref 0.61–1.24)
GFR calc Af Amer: >60 mL/min (ref >60)
GFR calc non Af Amer: >60 mL/min (ref >60)
Glucose, Bld: 94 mg/dL (ref 70–99)
Potassium: 4 mmol/L (ref 3.5–5.1)
Sodium: 138 mmol/L (ref 135–145)

## 2019-10-15 LAB — CBC
HCT: 44.9 % (ref 39.0–52.0)
Hemoglobin: 14.9 g/dL (ref 13.0–17.0)
MCH: 30.1 pg (ref 26.0–34.0)
MCHC: 33.2 g/dL (ref 30.0–36.0)
MCV: 90.7 fL (ref 80.0–100.0)
Platelets: 225 10*3/uL (ref 150–400)
RBC: 4.95 MIL/uL (ref 4.22–5.81)
RDW: 13.2 % (ref 11.5–15.5)
WBC: 5.4 10*3/uL (ref 4.0–10.5)
nRBC: 0 % (ref 0.0–0.2)

## 2019-10-15 LAB — TROPONIN I (HIGH SENSITIVITY): Troponin I (High Sensitivity): 2 ng/L (ref <18)

## 2019-10-15 MED ORDER — SODIUM CHLORIDE 0.9 % IV BOLUS
1000.0000 mL | Freq: Once | INTRAVENOUS | Status: AC
  Administered 2019-10-15: 08:00:00 1000 mL via INTRAVENOUS

## 2019-10-15 NOTE — ED Notes (Signed)
Pt given PO challenge and tolerated well. Pt currently a&o x 4 with steady gait.

## 2019-10-15 NOTE — ED Provider Notes (Signed)
[address] [facility] Emergency Department Provider Note  Time seen: 7:52 AM  I have reviewed the triage vital signs and the nursing notes.   HISTORY  Chief Complaint Loss of Consciousness   HPI Timothy [name] is a 24 y.o. male with a past medical history of schizophrenia, presents to the emergency department for a syncopal episode.  According to the patient he was at a job interview this morning when he had a syncopal event.  Per EMS no reported seizure-like activity.  Patient was initially minimally responsive per EMS however became more responsive in route to the hospital.  Upon arrival patient is somnolent but does awaken to voice will answer questions will follow commands.  Patient does state a history of syncope in the past.  Denies any chest pain or shortness of breath.  Patient arrived in a c-collar but denies neck pain, no pain to palpation.   Past Medical History:  Diagnosis Date  . Cannabis abuse   . Paranoid schizophrenia (HCC)   . Schizoaffective disorder, depressive type (HCC) 09/17/2014    Patient Active Problem List   Diagnosis Date Noted  . Benzodiazepine abuse (HCC) 08/13/2016  . Schizoaffective disorder, depressive type (HCC) 08/12/2016  . Schizoaffective disorder, bipolar type (HCC) 09/18/2014  . Tobacco use disorder 09/18/2014  . 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 Cannabis use disorder, severe, dependence (HCC) 09/17/2014    History reviewed. No pertinent surgical history.  Prior to Admission medications   Medication Sig Start Date End Date Taking? Authorizing Provider  buPROPion (WELLBUTRIN) 100 MG tablet Take 1 tablet (100 mg total) by mouth daily. For depression 08/21/16   Armandina Stammer I, NP  cephALEXin (KEFLEX) 500 MG capsule 500 mg BID x 10 days 03/10/17   Arthor Captain, PA-C  divalproex (DEPAKOTE ER) 250 MG 24 hr tablet Take 3 tablets (750 mg total) by mouth daily at 8 pm. For mood stabilization 08/20/16   Armandina Stammer I, NP   hydrOXYzine (ATARAX/VISTARIL) 25 MG tablet Take 1 tablet (25 mg total) by mouth every 6 (six) hours as needed for anxiety. 08/20/16   Armandina Stammer I, NP  QUEtiapine (SEROQUEL) 50 MG tablet Take 7 tablets (350 mg total) by mouth at bedtime. For mood control 08/20/16   Armandina Stammer I, NP  traZODone (DESYREL) 100 MG tablet Take 1 tablet (100 mg total) by mouth at bedtime as needed for sleep. 08/20/16   Sanjuana Kava, NP    Allergies  Allergen Reactions  . Haldol [Haloperidol Lactate] Other (See Comments)    Muscle stiffness  . Abilify [Aripiprazole]     eps  . Penicillins Hives    Felt like throat was closing Has patient had a PCN reaction causing immediate rash, facial/tongue/throat swelling, SOB or lightheadedness with hypotension: Unknown Has patient had a PCN reaction causing severe rash involving mucus membranes or skin necrosis: unknown Has patient had a PCN reaction that required hospitalization Unknown Has patient had a PCN reaction occurring within the last 10 years: Unknown If all of the above answers are "NO", then may proceed with Cephalosporin use.   . Zyprexa [Olanzapine]     eps    Family History  Problem Relation Age of Onset  . Mental illness Brother   . Drug abuse Brother   . Mental illness Cousin   . Suicidality Cousin   . Diabetes Maternal Grandmother     Social History Social History   Tobacco Use  . Smoking status: Never Smoker  . Smokeless tobacco:  Never Used  Substance Use Topics  . Alcohol use: No  . Drug use: Yes    Types: Marijuana    Review of Systems Constitutional: Negative for fever. Cardiovascular: Negative for chest pain. Respiratory: Negative for shortness of breath. Gastrointestinal: Negative for abdominal pain Musculoskeletal: Negative for musculoskeletal complaints Neurological: Negative for headache All other ROS negative  ____________________________________________   PHYSICAL EXAM:  VITAL SIGNS: ED Triage Vitals  Enc  Vitals Group     BP 10/15/19 0733 115/71     Pulse Rate 10/15/19 0733 (!) 50     Resp 10/15/19 0748 14     Temp 10/15/19 0733 97.8 F (36.6 C)     Temp Source 10/15/19 0733 Oral     SpO2 10/15/19 0733 100 %     Weight 10/15/19 0735 229 lb 11.5 oz (104.2 kg)     Height 10/15/19 0735 6\' 3"  (1.905 m)     Head Circumference --      Peak Flow --      Pain Score 10/15/19 0734 8     Pain Loc --      Pain Edu? --      Excl. in [place]? --    Constitutional: Alert and oriented. Well appearing and in no distress. Eyes: Normal exam ENT      Head: Normocephalic and atraumatic.      Mouth/Throat: Mucous membranes are moist. Cardiovascular: Normal rate, regular rhythm.  Respiratory: Normal respiratory effort without tachypnea nor retractions. Breath sounds are clear  Gastrointestinal: Soft and nontender. No distention.   Musculoskeletal: Nontender with normal range of motion in all extremities.  Neurologic:  Normal speech and language. No gross focal neurologic deficits Skin:  Skin is warm, dry and intact.  Psychiatric: Mood and affect are normal.   ____________________________________________    EKG  EKG viewed and interpreted by myself shows a normal sinus rhythm at 50 bpm with a narrow QRS, normal axis, normal intervals, no concerning ST changes.  ____________________________________________    RADIOLOGY  CT head negative  ____________________________________________   INITIAL IMPRESSION / ASSESSMENT AND PLAN / ED COURSE  Pertinent labs & imaging results that were available during my care of the patient were reviewed by me and considered in my medical decision making (see chart for details).   Patient presents to the emergency department after a syncopal event this morning during a job interview.  Patient remains somewhat somnolent, but does awaken to voice and answers questions and follows commands.  Patient states a history of syncope in the past.  Vital signs are reassuring.   EKG is reassuring.  We will obtain labs, IV hydrate and continue to closely monitor the patient.  Patient agreeable to plan of care.  Patient's work-up is essentially negative.  Reassuring EKG and lab work.  Negative troponin.  Patient continues to feel somnolent and states some dizziness.  Patient receiving IV fluids we will obtain a CT scan of the head as a precaution.  Patient's work-up is essentially negative.  CT scan is negative.  Patient is asking for something to eat and drink.  We will feed the patient in the emergency department.  After which I believe the patient is safe for discharge home.  He is working on trying to find a ride home.  Timothy [name] was evaluated in Emergency Department on 10/15/2019 for the symptoms described in the history of present illness. He was evaluated in the context of the global COVID-19 pandemic, which necessitated consideration that the  patient might be at risk for infection with the SARS-CoV-2 virus that causes COVID-19. Institutional protocols and algorithms that pertain to the evaluation of patients at risk for COVID-19 are in a state of rapid change based on information released by regulatory bodies including the CDC and federal and state organizations. These policies and algorithms were followed during the patient's care in the ED.  ____________________________________________   FINAL CLINICAL IMPRESSION(S) / ED DIAGNOSES  Syncope   Minna Antis, MD 10/15/19 1346

## 2019-10-15 NOTE — ED Triage Notes (Signed)
pt [place] via ems for sycopal [place]. bystander reported pt walking with unsteady gait. sluggish and slow to respond but a&o x 4 for ems.  bradycardia. ems states when fire rescue arrived patient was not initially responsive to painful stimuli, but on ems arrival pt was standing and a&o x 4. Pt in c collar on arrival due to fall. NAD noted at this time   cbg 95 BP 118/70

## 2019-11-09 ENCOUNTER — Encounter: Payer: Self-pay | Admitting: Emergency Medicine

## 2019-11-09 ENCOUNTER — Other Ambulatory Visit: Payer: Self-pay

## 2019-11-09 ENCOUNTER — Emergency Department
Admission: EM | Admit: 2019-11-09 | Discharge: 2019-11-10 | Disposition: A | Discharge status: Other Acute Inpatient Hospital | Payer: Medicaid Other | Attending: Emergency Medicine | Admitting: Emergency Medicine

## 2019-11-09 DIAGNOSIS — F250 Schizoaffective disorder, bipolar type: Secondary | ICD-10-CM | POA: Diagnosis present

## 2019-11-09 DIAGNOSIS — G2111 Neuroleptic induced parkinsonism: Secondary | ICD-10-CM | POA: Insufficient documentation

## 2019-11-09 DIAGNOSIS — Z79899 Other long term (current) drug therapy: Secondary | ICD-10-CM | POA: Insufficient documentation

## 2019-11-09 DIAGNOSIS — Z20822 Contact with and (suspected) exposure to covid-19: Secondary | ICD-10-CM | POA: Insufficient documentation

## 2019-11-09 DIAGNOSIS — F209 Schizophrenia, unspecified: Secondary | ICD-10-CM

## 2019-11-09 LAB — CBC
HCT: 46 % (ref 39.0–52.0)
Hemoglobin: 15.1 g/dL (ref 13.0–17.0)
MCH: 30.3 pg (ref 26.0–34.0)
MCHC: 32.8 g/dL (ref 30.0–36.0)
MCV: 92.2 fL (ref 80.0–100.0)
Platelets: 240 10*3/uL (ref 150–400)
RBC: 4.99 MIL/uL (ref 4.22–5.81)
RDW: 12.8 % (ref 11.5–15.5)
WBC: 7 10*3/uL (ref 4.0–10.5)
nRBC: 0 % (ref 0.0–0.2)

## 2019-11-09 LAB — URINE DRUG SCREEN, QUALITATIVE (ARMC ONLY)
Amphetamines, Ur Screen: NOT DETECTED
Barbiturates, Ur Screen: NOT DETECTED
Benzodiazepine, Ur Scrn: NOT DETECTED
Cannabinoid 50 Ng, Ur ~~LOC~~: POSITIVE — AB
Cocaine Metabolite,Ur ~~LOC~~: NOT DETECTED
MDMA (Ecstasy)Ur Screen: NOT DETECTED
Methadone Scn, Ur: NOT DETECTED
Opiate, Ur Screen: NOT DETECTED
Phencyclidine (PCP) Ur S: NOT DETECTED
Tricyclic, Ur Screen: NOT DETECTED

## 2019-11-09 LAB — COMPREHENSIVE METABOLIC PANEL
ALT: 25 U/L (ref 0–44)
AST: 27 U/L (ref 15–41)
Albumin: 4.1 g/dL (ref 3.5–5.0)
Alkaline Phosphatase: 41 U/L (ref 38–126)
Anion gap: 6 (ref 5–15)
BUN: 16 mg/dL (ref 6–20)
CO2: 28 mmol/L (ref 22–32)
Calcium: 9 mg/dL (ref 8.9–10.3)
Chloride: 107 mmol/L (ref 98–111)
Creatinine, Ser: 1.13 mg/dL (ref 0.61–1.24)
GFR calc Af Amer: >60 mL/min (ref >60)
GFR calc non Af Amer: >60 mL/min (ref >60)
Glucose, Bld: 92 mg/dL (ref 70–99)
Potassium: 4 mmol/L (ref 3.5–5.1)
Sodium: 141 mmol/L (ref 135–145)
Total Bilirubin: 0.8 mg/dL (ref 0.3–1.2)
Total Protein: 7.5 g/dL (ref 6.5–8.1)

## 2019-11-09 LAB — ETHANOL: Alcohol, Ethyl (B): <10 mg/dL (ref <10)

## 2019-11-09 LAB — RESPIRATORY PANEL BY RT PCR (FLU A&B, COVID)
Influenza A by PCR: NEGATIVE
Influenza B by PCR: NEGATIVE
SARS Coronavirus 2 by RT PCR: NEGATIVE

## 2019-11-09 NOTE — ED Provider Notes (Signed)
[facility] Emergency Department Provider Note  ____________________________________________  Time seen: Approximately 9:28 PM  I have reviewed the triage vital signs and the nursing notes.   HISTORY  Chief Complaint mental health evaluation    HPI Timothy [name] is a 24 y.o. male with a history of paranoid schizophrenia and polysubstance abuse who is brought to the ED due to bizarre behavior.  Patient reports that he hears voices that are distressing.  He has a feeling of being followed and watched, he feels paranoid and afraid.  Denies visual hallucinations.  Has occasional SI but no plan or intent to harm himself currently.  No HI.  Symptoms are constant, no aggravating or alleviating factors.      Past Medical History:  Diagnosis Date  . Cannabis abuse   . Paranoid schizophrenia (HCC)   . Schizoaffective disorder, depressive type (HCC) 09/17/2014     Patient Active Problem List   Diagnosis Date Noted  . Benzodiazepine abuse (HCC) 08/13/2016  . Schizoaffective disorder, depressive type (HCC) 08/12/2016  . Schizoaffective disorder, bipolar type (HCC) 09/18/2014  . Tobacco use disorder 09/18/2014  . 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 Cannabis use disorder, severe, dependence (HCC) 09/17/2014     History reviewed. No pertinent surgical history.   Prior to Admission medications   Medication Sig Start Date End Date Taking? Authorizing Provider  buPROPion (WELLBUTRIN) 100 MG tablet Take 1 tablet (100 mg total) by mouth daily. For depression 08/21/16   Armandina Stammer I, NP  cephALEXin (KEFLEX) 500 MG capsule 500 mg BID x 10 days 03/10/17   Arthor Captain, PA-C  divalproex (DEPAKOTE ER) 250 MG 24 hr tablet Take 3 tablets (750 mg total) by mouth daily at 8 pm. For mood stabilization 08/20/16   Armandina Stammer I, NP  hydrOXYzine (ATARAX/VISTARIL) 25 MG tablet Take 1 tablet (25 mg total) by mouth every 6 (six) hours as needed for anxiety.  08/20/16   Armandina Stammer I, NP  QUEtiapine (SEROQUEL) 50 MG tablet Take 7 tablets (350 mg total) by mouth at bedtime. For mood control 08/20/16   Armandina Stammer I, NP  traZODone (DESYREL) 100 MG tablet Take 1 tablet (100 mg total) by mouth at bedtime as needed for sleep. 08/20/16   Armandina Stammer I, NP     Allergies Haldol [haloperidol lactate], Abilify [aripiprazole], Penicillins, and Zyprexa [olanzapine]   Family History  Problem Relation Age of Onset  . Mental illness Brother   . Drug abuse Brother   . Mental illness Cousin   . Suicidality Cousin   . Diabetes Maternal Grandmother     Social History Social History   Tobacco Use  . Smoking status: Never Smoker  . Smokeless tobacco: Never Used  Substance Use Topics  . Alcohol use: No  . Drug use: Yes    Types: Marijuana    Review of Systems  Constitutional:   No fever or chills.  ENT:   No sore throat. No rhinorrhea. Cardiovascular:   No chest pain or syncope. Respiratory:   No dyspnea or cough. Gastrointestinal:   Negative for abdominal pain, vomiting and diarrhea.  Musculoskeletal:   Negative for focal pain or swelling All other systems reviewed and are negative except as documented above in ROS and HPI.  ____________________________________________   PHYSICAL EXAM:  VITAL SIGNS: ED Triage Vitals  Enc Vitals Group     BP 11/09/19 1839 125/77     Pulse Rate 11/09/19 1839 (!) 56     Resp 11/09/19 1839  18     Temp 11/09/19 1839 98.9 F (37.2 C)     Temp Source 11/09/19 1839 Oral     SpO2 11/09/19 1839 99 %     Weight 11/09/19 1840 230 lb (104.3 kg)     Height 11/09/19 1840 6\' 3"  (1.905 m)     Head Circumference --      Peak Flow --      Pain Score 11/09/19 1840 0     Pain Loc --      Pain Edu? --      Excl. in [place]? --     Vital signs reviewed, nursing assessments reviewed.   Constitutional:   Alert and oriented. Non-toxic appearance. Eyes:   Conjunctivae are normal. EOMI. PERRL. ENT      Head:    Normocephalic and atraumatic.      Nose:   Wearing a mask.      Mouth/Throat:   Wearing a mask.      Neck:   No meningismus. Full ROM. Hematological/Lymphatic/Immunilogical:   No cervical lymphadenopathy. Cardiovascular:   RRR. Symmetric bilateral radial and DP pulses.  No murmurs. Cap refill less than 2 seconds. Respiratory:   Normal respiratory effort without tachypnea/retractions. Breath sounds are clear and equal bilaterally. No wheezes/rales/rhonchi. Gastrointestinal:   Soft and nontender. Non distended. There is no CVA tenderness.  No rebound, rigidity, or guarding. Genitourinary:   deferred Musculoskeletal:   Normal range of motion in all extremities. No joint effusions.  No lower extremity tenderness.  No edema. Neurologic:   Normal speech and language.  Motor grossly intact. No acute focal neurologic deficits are appreciated.  Skin:    Skin is warm, dry and intact. No rash noted.  No petechiae, purpura, or bullae.  ____________________________________________    LABS (pertinent positives/negatives) (all labs ordered are listed, but only abnormal results are displayed) Labs Reviewed  URINE DRUG SCREEN, QUALITATIVE ([facility] ONLY) - Abnormal; Notable for the following components:      Result Value   Cannabinoid 50 Ng, Ur [place] POSITIVE (*)    All other components within normal limits  RESPIRATORY PANEL BY RT PCR (FLU A&B, COVID)  COMPREHENSIVE METABOLIC PANEL  ETHANOL  CBC   ____________________________________________   EKG    ____________________________________________    RADIOLOGY  No results found.  ____________________________________________   PROCEDURES Procedures  ____________________________________________    CLINICAL IMPRESSION / ASSESSMENT AND PLAN / ED COURSE  Medications ordered in the ED: Medications - No data to display  Pertinent labs & imaging results that were available during my care of the patient were reviewed by me and considered in  my medical decision making (see chart for details).  [birth year] was evaluated in Emergency Department on 11/09/2019 for the symptoms described in the history of present illness. He was evaluated in the context of the global COVID-19 pandemic, which necessitated consideration that the patient might be at risk for infection with the SARS-CoV-2 virus that causes COVID-19. Institutional protocols and algorithms that pertain to the evaluation of patients at risk for COVID-19 are in a state of rapid change based on information released by regulatory bodies including the CDC and federal and state organizations. These policies and algorithms were followed during the patient's care in the ED.   Patient presents with symptoms of schizophrenia.  He is currently calm and cooperative.  Not floridly psychotic, still lucid.  We will continue to observe the patient in the ED under voluntary status pending psychiatry evaluation.  The patient  has been placed in psychiatric observation due to the need to provide a safe environment for the patient while obtaining psychiatric consultation and evaluation, as well as ongoing medical and medication management to treat the patient's condition.  The patient has not been placed under full IVC at this time.       ____________________________________________   FINAL CLINICAL IMPRESSION(S) / ED DIAGNOSES    Final diagnoses:  Schizophrenia, unspecified type [facility])     ED Discharge Orders    None      Portions of this note were generated with dragon dictation software. Dictation errors may occur despite best attempts at proofreading.   Carrie Mew, MD 11/09/19 2129

## 2019-11-09 NOTE — ED Notes (Signed)
Pt ambulatory to toilet and returned to room, lying in bed, lights and TV on

## 2019-11-09 NOTE — Consult Note (Signed)
[facility] Face-to-Face Psychiatry Consult   Reason for Consult:  Psych evaluation Referring Physician:  Dr. Scotty Court Patient Identification: Timothy [name] MRN:  [id number] Principal Diagnosis: <principal problem not specified> Diagnosis:  Active Problems:   Schizoaffective disorder, bipolar type (HCC)   Total Time spent with patient: 1 hour  Subjective:   Timothy [name] is a 24 y.o. male patient admitted to [facility] "I be in a lot of thoughts".  Per ER-Nurse: Pt via pov; brought in by friend, who found him walking on the side of the road and was concerned for him. He was asked if he wanted help, and he said yes, so his friend brought him in. Pt reports that he has psych hx but does not want to discuss because much of it is "xrated." He states he hears voices in his head but he doesn't know what they say - just that they are are very noisy. Pt states he also has hallucinations. Pt states he has an apartment, but he is "basically homeless" because "the rent is due."   HPI:  Timothy [name], 24 y.o., male patient seen face to face by this provider; chart reviewed and consulted with Dr. Lucianne Muss on 11/09/19.  On evaluation Timothy [name] reports that he was walking along the highway with no destination.  He states that he ears voices which is just noise now but states that they use to be command voices.  He has a psychiatric diagnosis of schizophrenia.  He states that he is no longer taking his medication due to lack of insurance.  He used to be followed by [facility] for med management and therapy. He states he hasn't seen them "in a while" due to lack of insurance.  He does not remember all the meds he used to take but does remember taking Seroquel. At this time he says he is having thoughts of suicide. He states that he is having homicidal thoughts with a plan but did not feel comfortable telling me what the plan is.  During evaluation Timothy [name] is laying in the bed; he is alert/oriented x 4;   Dysphoric/somber/cooperative; and mood congruent with affect.  Patient is speaking in a clear tone at very low  volume, and normal pace; with poor eye contact (looking down or under the covers).  His thought process is disorganizedt; There is indication that he is currently responding to internal stimuli and experiencing delusional thought content.  Patient endorses suicidal/self-harm/homicidal ideation, and paranoia.  Patient has remained calm throughout assessment and has answered questions appropriately.   Past Psychiatric History: yes   Risk to Self:   Risk to Others:   Prior Inpatient Therapy:   Prior Outpatient Therapy:    Past Medical History:  Past Medical History:  Diagnosis Date  . Cannabis abuse   . Paranoid schizophrenia (HCC)   . Schizoaffective disorder, depressive type (HCC) 09/17/2014   History reviewed. No pertinent surgical history. Family History:  Family History  Problem Relation Age of Onset  . Mental illness Brother   . Drug abuse Brother   . Mental illness Cousin   . Suicidality Cousin   . Diabetes Maternal Grandmother    Family Psychiatric  History: unknown Social History:  Social History   Substance and Sexual Activity  Alcohol Use No     Social History   Substance and Sexual Activity  Drug Use Yes  . Types: Marijuana    Social History   Socioeconomic History  . Marital status: Single  Spouse name: Not on file  . Number of children: Not on file  . Years of education: Not on file  . Highest education level: Not on file  Occupational History  . Not on file  Tobacco Use  . Smoking status: Never Smoker  . Smokeless tobacco: Never Used  Substance and Sexual Activity  . Alcohol use: No  . Drug use: Yes    Types: Marijuana  . Sexual activity: Never  Other Topics Concern  . Not on file  Social History Narrative  . Not on file   Social Determinants of Health   Financial Resource Strain:   . Difficulty of Paying Living Expenses:   Food  Insecurity:   . Worried About Programme researcher, broadcasting/film/video in the Last Year:   . Barista in the Last Year:   Transportation Needs:   . Freight forwarder (Medical):   Marland Kitchen Lack of Transportation (Non-Medical):   Physical Activity:   . Days of Exercise per Week:   . Minutes of Exercise per Session:   Stress:   . Feeling of Stress :   Social Connections:   . Frequency of Communication with Friends and Family:   . Frequency of Social Gatherings with Friends and Family:   . Attends Religious Services:   . Active Member of Clubs or Organizations:   . Attends Banker Meetings:   Marland Kitchen Marital Status:    Additional Social History:    Allergies:   Allergies  Allergen Reactions  . Haldol [Haloperidol Lactate] Other (See Comments)    Muscle stiffness  . Abilify [Aripiprazole]     eps  . Penicillins Hives    Felt like throat was closing Has patient had a PCN reaction causing immediate rash, facial/tongue/throat swelling, SOB or lightheadedness with hypotension: Unknown Has patient had a PCN reaction causing severe rash involving mucus membranes or skin necrosis: unknown Has patient had a PCN reaction that required hospitalization Unknown Has patient had a PCN reaction occurring within the last 10 years: Unknown If all of the above answers are "NO", then may proceed with Cephalosporin use.   . Zyprexa [Olanzapine]     eps    Labs:  Results for orders placed or performed during the hospital encounter of 11/09/19 (from the past 48 hour(s))  Comprehensive metabolic panel     Status: None   Collection Time: 11/09/19  6:44 PM  Result Value Ref Range   Sodium 141 135 - 145 mmol/L   Potassium 4.0 3.5 - 5.1 mmol/L   Chloride 107 98 - 111 mmol/L   CO2 28 22 - 32 mmol/L   Glucose, Bld 92 70 - 99 mg/dL    Comment: Glucose reference range applies only to samples taken after fasting for at least 8 hours.   BUN 16 6 - 20 mg/dL   Creatinine, Ser 2.68 0.61 - 1.24 mg/dL   Calcium  9.0 8.9 - 34.1 mg/dL   Total Protein 7.5 6.5 - 8.1 g/dL   Albumin 4.1 3.5 - 5.0 g/dL   AST 27 15 - 41 U/L   ALT 25 0 - 44 U/L   Alkaline Phosphatase 41 38 - 126 U/L   Total Bilirubin 0.8 0.3 - 1.2 mg/dL   GFR calc non Af Amer >60 >60 mL/min   GFR calc Af Amer >60 >60 mL/min   Anion gap 6 5 - 15    Comment: Performed at [facility], [address], [place], [place] [address]  Ethanol     Status: None   Collection Time: 11/09/19  6:44 PM  Result Value Ref Range   Alcohol, Ethyl (B) <10 <10 mg/dL    Comment: (NOTE) Lowest detectable limit for serum alcohol is 10 mg/dL. For medical purposes only. Performed at [facility], [address] [place], [place] [address]   cbc     Status: None   Collection Time: 11/09/19  6:44 PM  Result Value Ref Range   WBC 7.0 4.0 - 10.5 K/uL   RBC 4.99 4.22 - 5.81 MIL/uL   Hemoglobin 15.1 13.0 - 17.0 g/dL   HCT 44.046.0 34.739.0 - 42.552.0 %   MCV 92.2 80.0 - 100.0 fL   MCH 30.3 26.0 - 34.0 pg   MCHC 32.8 30.0 - 36.0 g/dL   RDW 95.612.8 38.711.5 - 56.415.5 %   Platelets 240 150 - 400 K/uL   nRBC 0.0 0.0 - 0.2 %    Comment: Performed at [facility], [address] [place], [place] [address]  Urine Drug Screen, Qualitative     Status: Abnormal   Collection Time: 11/09/19  6:44 PM  Result Value Ref Range   Tricyclic, Ur Screen NONE DETECTED NONE DETECTED   Amphetamines, Ur Screen NONE DETECTED NONE DETECTED   MDMA (Ecstasy)Ur Screen NONE DETECTED NONE DETECTED   Cocaine Metabolite,Ur [place] NONE DETECTED NONE DETECTED   Opiate, Ur Screen NONE DETECTED NONE DETECTED   Phencyclidine (PCP) Ur S NONE DETECTED NONE DETECTED   Cannabinoid 50 Ng, Ur [place] POSITIVE (A) NONE DETECTED   Barbiturates, Ur Screen NONE DETECTED NONE DETECTED   Benzodiazepine, Ur Scrn NONE DETECTED NONE DETECTED   Methadone Scn, Ur NONE DETECTED NONE DETECTED    Comment: (NOTE) Tricyclics + metabolites, urine    Cutoff 1000 ng/mL Amphetamines + metabolites, urine  Cutoff  1000 ng/mL MDMA (Ecstasy), urine              Cutoff 500 ng/mL Cocaine Metabolite, urine          Cutoff 300 ng/mL Opiate + metabolites, urine        Cutoff 300 ng/mL Phencyclidine (PCP), urine         Cutoff 25 ng/mL Cannabinoid, urine                 Cutoff 50 ng/mL Barbiturates + metabolites, urine  Cutoff 200 ng/mL Benzodiazepine, urine              Cutoff 200 ng/mL Methadone, urine                   Cutoff 300 ng/mL The urine drug screen provides only a preliminary, unconfirmed analytical test result and should not be used for non-medical purposes. Clinical consideration and professional judgment should be applied to any positive drug screen result due to possible interfering substances. A more specific alternate chemical method must be used in order to obtain a confirmed analytical result. Gas chromatography / mass spectrometry (GC/MS) is the preferred confirmat ory method. Performed at [facility], [address] [place], [place] [address]   Respiratory Panel by RT PCR (Flu A&B, Covid) -     Status: None   Collection Time: 11/09/19  9:15 PM  Result Value Ref Range   SARS Coronavirus 2 by RT PCR NEGATIVE NEGATIVE    Comment: (NOTE) SARS-CoV-2 target nucleic acids are NOT DETECTED. The SARS-CoV-2 RNA is generally detectable in upper respiratoy specimens during the acute phase of infection. The lowest concentration  of SARS-CoV-2 viral copies this assay can detect is 131 copies/mL. A negative result does not preclude SARS-Cov-2 infection and should not be used as the sole basis for treatment or other patient management decisions. A negative result may occur with  improper specimen collection/handling, submission of specimen other than nasopharyngeal swab, presence of viral mutation(s) within the areas targeted by this assay, and inadequate number of viral copies (<131 copies/mL). A negative result must be combined with clinical observations, patient history, and  epidemiological information. The expected result is Negative. Fact Sheet for Patients:  https://www.moore.com/ Fact Sheet for Healthcare Providers:  https://www.young.biz/ This test is not yet ap proved or cleared by the [place] FDA and  has been authorized for detection and/or diagnosis of SARS-CoV-2 by FDA under an Emergency Use Authorization (EUA). This EUA will remain  in effect (meaning this test can be used) for the duration of the COVID-19 declaration under Section 564(b)(1) of the Act, 21 U.S.C. section 360bbb-3(b)(1), unless the authorization is terminated or revoked sooner.    Influenza A by PCR NEGATIVE NEGATIVE   Influenza B by PCR NEGATIVE NEGATIVE    Comment: (NOTE) The Xpert Xpress SARS-CoV-2/FLU/RSV assay is intended as an aid in  the diagnosis of influenza from Nasopharyngeal swab specimens and  should not be used as a sole basis for treatment. Nasal washings and  aspirates are unacceptable for Xpert Xpress SARS-CoV-2/FLU/RSV  testing. Fact Sheet for Patients: https://www.moore.com/ Fact Sheet for Healthcare Providers: https://www.young.biz/ This test is not yet approved or cleared by the [place] FDA and  has been authorized for detection and/or diagnosis of SARS-CoV-2 by  FDA under an Emergency Use Authorization (EUA). This EUA will remain  in effect (meaning this test can be used) for the duration of the  Covid-19 declaration under Section 564(b)(1) of the Act, 21  U.S.C. section 360bbb-3(b)(1), unless the authorization is  terminated or revoked. Performed at [facility], [address] [place], [place] [address]     No current facility-administered medications for this encounter.   Current Outpatient Medications  Medication Sig Dispense Refill  . buPROPion (WELLBUTRIN) 100 MG tablet Take 1 tablet (100 mg total) by mouth daily. For depression 30 tablet 0  .  cephALEXin (KEFLEX) 500 MG capsule 500 mg BID x 10 days 20 capsule 0  . divalproex (DEPAKOTE ER) 250 MG 24 hr tablet Take 3 tablets (750 mg total) by mouth daily at 8 pm. For mood stabilization 90 tablet 0  . hydrOXYzine (ATARAX/VISTARIL) 25 MG tablet Take 1 tablet (25 mg total) by mouth every 6 (six) hours as needed for anxiety. 60 tablet 0  . QUEtiapine (SEROQUEL) 50 MG tablet Take 7 tablets (350 mg total) by mouth at bedtime. For mood control 210 tablet 0  . traZODone (DESYREL) 100 MG tablet Take 1 tablet (100 mg total) by mouth at bedtime as needed for sleep. 30 tablet 0    Musculoskeletal: Strength & Muscle Tone: within normal limits Gait & Station: normal Patient leans: N/A  Psychiatric Specialty Exam: Physical Exam  Nursing note and vitals reviewed. Constitutional: He is oriented to person, place, and time. He appears well-developed.  HENT:  Head: Normocephalic.  Eyes: Pupils are equal, round, and reactive to light.  Respiratory: Effort normal.  Musculoskeletal:        General: Normal range of motion.     Cervical back: Normal range of motion.  Neurological: He is alert and oriented to person, place, and time.  Skin: Skin is warm  and dry.  Psychiatric: His speech is normal. His affect is blunt. He is slowed and withdrawn. Thought content is delusional. Cognition and memory are impaired. He expresses inappropriate judgment. He exhibits a depressed mood. He expresses homicidal and suicidal ideation. He expresses homicidal plans.    Review of Systems  Psychiatric/Behavioral: Positive for dysphoric mood, hallucinations and suicidal ideas.  All other systems reviewed and are negative.   Blood pressure 125/77, pulse (!) 56, temperature 98.9 F (37.2 C), temperature source Oral, resp. rate 18, height 6\' 3"  (1.905 m), weight 104.3 kg, SpO2 99 %.Body mass index is 28.75 kg/m.  General Appearance: Casual  Eye Contact:  Poor  Speech:  Slow  Volume:  Decreased  Mood:  Depressed,  Dysphoric and Hopeless  Affect:  Congruent, Depressed and Flat  Thought Process:  Disorganized and Descriptions of Associations: Circumstantial  Orientation:  Full (Time, Place, and Person)  Thought Content:  Illogical  Suicidal Thoughts:  Yes.  without intent/plan  Homicidal Thoughts:  Yes.  with intent/plan  Memory:  NA  Judgement:  Impaired  Insight:  Lacking  Psychomotor Activity:  Normal  Concentration:  Attention Span: Fair  Recall:  [employer] of Knowledge:  Fair  Language:  Fair  Akathisia:  NA  Handed:  Right  AIMS (if indicated):     Assets:  Social Support  ADL's:  Impaired  Cognition:  Impaired,  Moderate  Sleep:        Treatment Plan Summary: Daily contact with patient to assess and evaluate symptoms and progress in treatment, Medication management and Plan admit to adult inpatient unit. restart patient on seroquel   Disposition: Recommend psychiatric Inpatient admission when medically cleared. Supportive therapy provided about ongoing stressors. Restart patient on appropriate medication  Deloria Lair, NP 11/09/2019 10:25 PM

## 2019-11-09 NOTE — ED Notes (Signed)
Belongings: black jacket, black watch, copper colored bracelet, lighter, white shoes, black socks, black shorts, gray tshirt,

## 2019-11-09 NOTE — ED Notes (Signed)
Hourly rounding reveals patient awake in room. No complaints, stable, in no acute distress. Q15 minute rounds and monitoring via Rover and Officer to continue.  

## 2019-11-09 NOTE — ED Notes (Signed)
Pt brought in by friend/pastor  Virgel Bouquet [phone]

## 2019-11-09 NOTE — ED Triage Notes (Signed)
Pt via pov; brought in by friend, who found him walking on the side of the road and was concerned for him. He was asked if he wanted help, and he said yes, so his friend brought him in. Pt reports that he has psych hx but does not want to discuss because much of it is "xrated." He states he hears voices in his head but he doesn't know what they say - just that they are are very noisy. Pt states he also has hallucinations. Pt states he has an apartment, but he is "basically homeless" because "the rent is due."   Pt calm and cooperative during triage.

## 2019-11-09 NOTE — ED Notes (Addendum)
Pt oriented to rules of BHU, pt very soft spoken reports no intention to SI or HI att, reports still having audible hullucinations, pt denies pain  Pt shown bathroom and CARE channel turned on at pt consent, pt given cup of water and blanket  Pt sitting in chair, calm att

## 2019-11-09 NOTE — ED Notes (Signed)
Pt. Transferred from Triage to room 21 after dressing out and screening for contraband. Pt. Oriented to Quad including Q15 minute rounds as well as Psychologist, counselling for their protection. Patient is alert and oriented, warm and dry in no acute distress. Patient denies SI, but states he still has HI towards people in general and is hearing voices without command and "seeing flashes". Pt. Encouraged to let me know if needs arise.

## 2019-11-10 ENCOUNTER — Inpatient Hospital Stay
Admission: EM | Admit: 2019-11-10 | Discharge: 2019-11-16 | DRG: 885 | Disposition: A | Discharge status: Home / Self Care | Payer: No Typology Code available for payment source | Source: Intra-hospital | Attending: Psychiatry | Admitting: Psychiatry

## 2019-11-10 ENCOUNTER — Other Ambulatory Visit: Payer: Self-pay

## 2019-11-10 ENCOUNTER — Encounter: Payer: Self-pay | Admitting: Psychiatric/Mental Health

## 2019-11-10 DIAGNOSIS — F17210 Nicotine dependence, cigarettes, uncomplicated: Secondary | ICD-10-CM | POA: Diagnosis present

## 2019-11-10 DIAGNOSIS — Z590 Homelessness: Secondary | ICD-10-CM | POA: Diagnosis not present

## 2019-11-10 DIAGNOSIS — Z813 Family history of other psychoactive substance abuse and dependence: Secondary | ICD-10-CM

## 2019-11-10 DIAGNOSIS — Z888 Allergy status to other drugs, medicaments and biological substances status: Secondary | ICD-10-CM | POA: Diagnosis not present

## 2019-11-10 DIAGNOSIS — Z818 Family history of other mental and behavioral disorders: Secondary | ICD-10-CM | POA: Diagnosis not present

## 2019-11-10 DIAGNOSIS — F250 Schizoaffective disorder, bipolar type: Secondary | ICD-10-CM

## 2019-11-10 DIAGNOSIS — Z20822 Contact with and (suspected) exposure to covid-19: Secondary | ICD-10-CM | POA: Diagnosis present

## 2019-11-10 DIAGNOSIS — Z9114 Patient's other noncompliance with medication regimen: Secondary | ICD-10-CM | POA: Diagnosis not present

## 2019-11-10 DIAGNOSIS — Z833 Family history of diabetes mellitus: Secondary | ICD-10-CM | POA: Diagnosis not present

## 2019-11-10 DIAGNOSIS — R440 Auditory hallucinations: Secondary | ICD-10-CM | POA: Diagnosis present

## 2019-11-10 DIAGNOSIS — R45850 Homicidal ideations: Secondary | ICD-10-CM | POA: Diagnosis present

## 2019-11-10 DIAGNOSIS — F259 Schizoaffective disorder, unspecified: Secondary | ICD-10-CM | POA: Diagnosis present

## 2019-11-10 DIAGNOSIS — Y900 Blood alcohol level of less than 20 mg/100 ml: Secondary | ICD-10-CM | POA: Diagnosis present

## 2019-11-10 DIAGNOSIS — Z7289 Other problems related to lifestyle: Secondary | ICD-10-CM

## 2019-11-10 DIAGNOSIS — F1010 Alcohol abuse, uncomplicated: Secondary | ICD-10-CM | POA: Diagnosis present

## 2019-11-10 DIAGNOSIS — R45851 Suicidal ideations: Secondary | ICD-10-CM | POA: Diagnosis present

## 2019-11-10 DIAGNOSIS — F251 Schizoaffective disorder, depressive type: Secondary | ICD-10-CM | POA: Diagnosis present

## 2019-11-10 DIAGNOSIS — Z880 Allergy status to penicillin: Secondary | ICD-10-CM | POA: Diagnosis not present

## 2019-11-10 DIAGNOSIS — F12188 Cannabis abuse with other cannabis-induced disorder: Secondary | ICD-10-CM | POA: Diagnosis present

## 2019-11-10 LAB — VALPROIC ACID LEVEL: Valproic Acid Lvl: <10 ug/mL — ABNORMAL LOW (ref 50.0–100.0)

## 2019-11-10 MED ORDER — HYDROXYZINE HCL 25 MG PO TABS
25.0000 mg | ORAL_TABLET | Freq: Three times a day (TID) | ORAL | Status: DC | PRN
  Administered 2019-11-14: 25 mg via ORAL
  Filled 2019-11-10: qty 1

## 2019-11-10 MED ORDER — ALUM & MAG HYDROXIDE-SIMETH 200-200-20 MG/5ML PO SUSP: 30.0000 mL | ORAL | Status: DC | PRN

## 2019-11-10 MED ORDER — TRAZODONE HCL 100 MG PO TABS
100.0000 mg | ORAL_TABLET | Freq: Every evening | ORAL | Status: DC | PRN
  Administered 2019-11-10 – 2019-11-14 (×3): 100 mg via ORAL
  Filled 2019-11-10 (×3): qty 1

## 2019-11-10 MED ORDER — ACETAMINOPHEN 325 MG PO TABS: 650.0000 mg | ORAL_TABLET | Freq: Four times a day (QID) | ORAL | Status: DC | PRN

## 2019-11-10 MED ORDER — MAGNESIUM HYDROXIDE 400 MG/5ML PO SUSP: 30.0000 mL | Freq: Every day | ORAL | Status: DC | PRN

## 2019-11-10 MED ORDER — DIVALPROEX SODIUM 250 MG PO DR TAB
750.0000 mg | DELAYED_RELEASE_TABLET | Freq: Every evening | ORAL | Status: DC
  Administered 2019-11-10 – 2019-11-15 (×6): 750 mg via ORAL
  Filled 2019-11-10 (×6): qty 3

## 2019-11-10 MED ORDER — QUETIAPINE FUMARATE 100 MG PO TABS
300.0000 mg | ORAL_TABLET | Freq: Every day | ORAL | Status: DC
  Administered 2019-11-10 – 2019-11-15 (×6): 300 mg via ORAL
  Filled 2019-11-10 (×6): qty 3

## 2019-11-10 MED ORDER — DIVALPROEX SODIUM 500 MG PO DR TAB: 500.0000 mg | DELAYED_RELEASE_TABLET | Freq: Every evening | ORAL | Status: DC

## 2019-11-10 MED ORDER — BUPROPION HCL ER (SR) 100 MG PO TB12
100.0000 mg | ORAL_TABLET | Freq: Every day | ORAL | Status: DC
  Administered 2019-11-10: 11:00:00 100 mg via ORAL
  Filled 2019-11-10: qty 1

## 2019-11-10 NOTE — [facility] Group Notes (Addendum)
LCSW Group Therapy 11/10/2019 1:00pm  Type of Therapy and Topic:  Group Therapy:  Setting Goals  Participation Level:  Active  Description of Group: In this process group, patients discussed using strengths to work toward goals and address challenges.  Patients identified two positive things about themselves and one goal they were working on.  Patients were given the opportunity to share openly and support each other's plan for self-empowerment.  The group discussed the value of gratitude and were encouraged to have a daily reflection of positive characteristics or circumstances.  Patients were encouraged to identify a plan to utilize their strengths to work on current challenges and goals.  Therapeutic Goals 1. Patient will verbalize personal strengths/positive qualities and relate how these can assist with achieving desired personal goals 2. Patients will verbalize affirmation of peers plans for personal change and goal setting 3. Patients will explore the value of gratitude and positive focus as related to successful achievement of goals 4. Patients will verbalize a plan for regular reinforcement of personal positive qualities and circumstances.  Summary of Patient Progress: Patient identified the definition of goals.Patients was given the opportunity to share openly and support other group members' plan for self-empowerment. Patient verbalized personal strength and how they relate to achieving the desired goal. Patient was able to identify positive goals to work towards when he returns home. Patient participated in group and defined a goal as "something you get through and work towards in life." He participated in group discussion of the importance of setting goals. Group discussed SMART goals. Each group member identified a SMART goal they would like to achieve and discuss the steps they would like to take towards achievement. 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    Therapeutic Modalities Cognitive Behavioral Therapy Motivational Interviewing    Roselyn Bering, MSW, LCSW Clinical Social Work
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
## 2019-11-10 NOTE — H&P (Signed)
Psychiatric Admission Assessment Adult  Patient Identification: Timothy [name] MRN:  [id number] Date of Evaluation:  11/10/2019 Chief Complaint:  Schizoaffective disorder (HCC) [F25.9] Principal Diagnosis: <principal problem not specified> Diagnosis:  Active Problems:   Schizoaffective disorder (HCC)  History of Present Illness:  Mr. Iwanicki is a 24yo AAM with a past psychiatric history of Schizoaffective disorder, who was admitted from ER to inpatient [facility] unit today due to psychotic symptoms, suicidal and homicidal ideations in settings of being off psychotropic medications for about two months.  CC: "I am not doing good".  HPI: Patient reports he was off psych medications for about two months due to lack of medical insurance. He reports he was was diagnosed with Schizoaffective disorder, was followed by [facility] for psych med management and therapy in the past. Currently identifies her mood as "down", depressed, worthless. Reports having recurrent thoughts of death or suicide. No current suicidal plan. Reports feeling safe from harming self in the hospital. Reports history of two suicidal attempts with last one was in 2017. Patient complaints of high anxiety. Reports racing thoughts. Reports occasional sleep issues, states he smokes cannabis or drinks to self-medicate for sleep. Smokes cannabis regularly. Drinks occasionally. Last drink was "two shots" two days ago. No current withdrawal sx. Patient reports auditory hallucinations, non-commanding, "just voices talking to each other", reports that hallucinations not constant, they "come and go". Patient reports feeling "slightly paranoid", but safe in hospital environment. Reports occasional homicidal thoughts, denies particular target, "I just get angry and want to harm someone". Reports occasionally poor sleep. Denies problems with appetite. Patient denies any current physical complaints. He is help-seeking, oriented for future and says "I want to get  better".  Patient`s last psych med combination: Seroquel, Wellbutrin, Depakote, Trazodone, Vistaril, restarted in ED today and orders transferred to [facility] unit on admission.  PSYCH ROS: Safety: reports suicidal thoughts, reportsd homicidal thoughts. Depression: reports usually depressed, "down" mood, feeling sad, empty, or hopeless; recurrent thoughts of death or suicide; suicidal ideations without intent and plan. Anxiety: feeling nervous most of the time; not able to stop worrying; worrying too much; trouble relaxing; irritable; feeling afraid that something bad will happen. Psychosis:  paranoia; auditory hallucinations. Mania: racing thoughts, irritable mood; distractibility; decreased need of sleep. Dementia: no symptoms. Delirium: no symptoms.   LABs: reviewed. Mostly WNL. U-tox pos for cannabis.  PAST PSYCH HISTORY: Previous psych diagnoses: Schizoaffective disorder Patient reports history of multiple psychiatric hospitalizations: [facility], [facility], [facility]. Per chart - h/o admissions for psychosis, SI and HI.  Previous outpatient psychiatrist: Vesta Mixer Patient reports history of at least two prior suicide attempts - tried to hang self. Denies non-suicidal self-injurious behaviors. Denies history of violence. Previous psych medication: Abilify, Haldol, Zyprexa, Rispetidone, Seroquel, Wellbutrin, Depakote, Trazodone, Vistaril,  MEDICAL HISTORY: Patient denies any acute or chronic health problems.  ALLERGIES: Haldol, Penicillin, Zyprexa, Abilify.   SOCIAL HISTORY: Patient has no guardian. Patient was raised by both parents. Adverse childhood experience: denies h/o physical, emotional, sexual abuse; reports growing up in a household with instability due to parental separation. Patient raised by his grand mother. Patient has brothers and a sister who are supportive.  He is currently homeless.  Patient is single. Patient has no children. Patient`s education is HS grad. Patient is  unemployed. Patient denies legal history, being on probation, parole. Denies military history. Patient denies having guns in possession.  SUBSTANCE USE: Patient denies alcohol use. Nicotine:  1 PPD Illicit drug use: Marijuana.  FAMILY HISTORY:  Patient denies  a family history significant for suicide in family members.   Alcohol Screening: 1. How often do you have a drink containing alcohol?: Monthly or less 2. How many drinks containing alcohol do you have on a typical day when you are drinking?: 3 or 4 3. How often do you have six or more drinks on one occasion?: Never AUDIT-C Score: 2 9. Have you or someone else been injured as a result of your drinking?: No 10. Has a relative or friend or a doctor or another health worker been concerned about your drinking or suggested you cut down?: No Alcohol Use Disorder Identification Test Final Score (AUDIT): 2 Alcohol Brief Interventions/Follow-up: AUDIT Score <7 follow-up not indicated Substance Abuse History in the last 12 months:  Yes.   Consequences of Substance Abuse: NA Previous Psychotropic Medications: Yes  Psychological Evaluations: Yes  Past Medical History:  Past Medical History:  Diagnosis Date  . Cannabis abuse   . Paranoid schizophrenia (HCC)   . Schizoaffective disorder, depressive type (HCC) 09/17/2014   History reviewed. No pertinent surgical history. Family History:  Family History  Problem Relation Age of Onset  . Mental illness Brother   . Drug abuse Brother   . Mental illness Cousin   . Suicidality Cousin   . Diabetes Maternal Grandmother    Family Psychiatric  History: see above Tobacco Screening: Have you used any form of tobacco in the last 30 days? (Cigarettes, Smokeless Tobacco, Cigars, and/or Pipes): Yes Tobacco use, Select all that apply: 4 or less cigarettes per day Are you interested in Tobacco Cessation Medications?: No, patient refused Counseled patient on smoking cessation including recognizing  danger situations, developing coping skills and basic information about quitting provided: Yes Social History:  Social History   Substance and Sexual Activity  Alcohol Use Yes  . Alcohol/week: 1.0 standard drinks  . Types: 1 Shots of liquor per week     Social History   Substance and Sexual Activity  Drug Use Yes  . Types: Marijuana    Additional Social History: see above    Allergies:   Allergies  Allergen Reactions  . Haldol [Haloperidol Lactate] Other (See Comments)    Muscle stiffness  . Abilify [Aripiprazole]     eps  . Penicillins Hives    Felt like throat was closing Has patient had a PCN reaction causing immediate rash, facial/tongue/throat swelling, SOB or lightheadedness with hypotension: Unknown Has patient had a PCN reaction causing severe rash involving mucus membranes or skin necrosis: unknown Has patient had a PCN reaction that required hospitalization Unknown Has patient had a PCN reaction occurring within the last 10 years: Unknown If all of the above answers are "NO", then may proceed with Cephalosporin use.   . Zyprexa [Olanzapine]     eps   Lab Results:  Results for orders placed or performed during the hospital encounter of 11/09/19 (from the past 48 hour(s))  Comprehensive metabolic panel     Status: None   Collection Time: 11/09/19  6:44 PM  Result Value Ref Range   Sodium 141 135 - 145 mmol/L   Potassium 4.0 3.5 - 5.1 mmol/L   Chloride 107 98 - 111 mmol/L   CO2 28 22 - 32 mmol/L   Glucose, Bld 92 70 - 99 mg/dL    Comment: Glucose reference range applies only to samples taken after fasting for at least 8 hours.   BUN 16 6 - 20 mg/dL   Creatinine, Ser 1.611.13 0.61 - 1.24 mg/dL  Calcium 9.0 8.9 - 10.3 mg/dL   Total Protein 7.5 6.5 - 8.1 g/dL   Albumin 4.1 3.5 - 5.0 g/dL   AST 27 15 - 41 U/L   ALT 25 0 - 44 U/L   Alkaline Phosphatase 41 38 - 126 U/L   Total Bilirubin 0.8 0.3 - 1.2 mg/dL   GFR calc non Af Amer >60 >60 mL/min   GFR calc Af Amer  >60 >60 mL/min   Anion gap 6 5 - 15    Comment: Performed at [facility], [address], [place], [place] [address]  Ethanol     Status: None   Collection Time: 11/09/19  6:44 PM  Result Value Ref Range   Alcohol, Ethyl (B) <10 <10 mg/dL    Comment: (NOTE) Lowest detectable limit for serum alcohol is 10 mg/dL. For medical purposes only. Performed at [facility], [address] [place], [place] [address]   cbc     Status: None   Collection Time: 11/09/19  6:44 PM  Result Value Ref Range   WBC 7.0 4.0 - 10.5 K/uL   RBC 4.99 4.22 - 5.81 MIL/uL   Hemoglobin 15.1 13.0 - 17.0 g/dL   HCT 81.8 29.9 - 37.1 %   MCV 92.2 80.0 - 100.0 fL   MCH 30.3 26.0 - 34.0 pg   MCHC 32.8 30.0 - 36.0 g/dL   RDW 69.6 78.9 - 38.1 %   Platelets 240 150 - 400 K/uL   nRBC 0.0 0.0 - 0.2 %    Comment: Performed at [facility], [address] [place], [place] [address]  Urine Drug Screen, Qualitative     Status: Abnormal   Collection Time: 11/09/19  6:44 PM  Result Value Ref Range   Tricyclic, Ur Screen NONE DETECTED NONE DETECTED   Amphetamines, Ur Screen NONE DETECTED NONE DETECTED   MDMA (Ecstasy)Ur Screen NONE DETECTED NONE DETECTED   Cocaine Metabolite,Ur [place] NONE DETECTED NONE DETECTED   Opiate, Ur Screen NONE DETECTED NONE DETECTED   Phencyclidine (PCP) Ur S NONE DETECTED NONE DETECTED   Cannabinoid 50 Ng, Ur [place] POSITIVE (A) NONE DETECTED   Barbiturates, Ur Screen NONE DETECTED NONE DETECTED   Benzodiazepine, Ur Scrn NONE DETECTED NONE DETECTED   Methadone Scn, Ur NONE DETECTED NONE DETECTED    Comment: (NOTE) Tricyclics + metabolites, urine    Cutoff 1000 ng/mL Amphetamines + metabolites, urine  Cutoff 1000 ng/mL MDMA (Ecstasy), urine              Cutoff 500 ng/mL Cocaine Metabolite, urine          Cutoff 300 ng/mL Opiate + metabolites, urine        Cutoff 300 ng/mL Phencyclidine (PCP), urine         Cutoff 25 ng/mL Cannabinoid, urine                 Cutoff 50  ng/mL Barbiturates + metabolites, urine  Cutoff 200 ng/mL Benzodiazepine, urine              Cutoff 200 ng/mL Methadone, urine                   Cutoff 300 ng/mL The urine drug screen provides only a preliminary, unconfirmed analytical test result and should not be used for non-medical purposes. Clinical consideration and professional judgment should be applied to any positive drug screen result due to possible interfering substances. A more specific alternate chemical method must be used in  order to obtain a confirmed analytical result. Gas chromatography / mass spectrometry (GC/MS) is the preferred confirmat ory method. Performed at [facility], [address] [place], [place] [address]   Respiratory Panel by RT PCR (Flu A&B, Covid) -     Status: None   Collection Time: 11/09/19  9:15 PM  Result Value Ref Range   SARS Coronavirus 2 by RT PCR NEGATIVE NEGATIVE    Comment: (NOTE) SARS-CoV-2 target nucleic acids are NOT DETECTED. The SARS-CoV-2 RNA is generally detectable in upper respiratoy specimens during the acute phase of infection. The lowest concentration of SARS-CoV-2 viral copies this assay can detect is 131 copies/mL. A negative result does not preclude SARS-Cov-2 infection and should not be used as the sole basis for treatment or other patient management decisions. A negative result may occur with  improper specimen collection/handling, submission of specimen other than nasopharyngeal swab, presence of viral mutation(s) within the areas targeted by this assay, and inadequate number of viral copies (<131 copies/mL). A negative result must be combined with clinical observations, patient history, and epidemiological information. The expected result is Negative. Fact Sheet for Patients:  https://www.moore.com/ Fact Sheet for Healthcare Providers:  https://www.young.biz/ This test is not yet ap proved or cleared by the [place] FDA and  has been authorized for detection and/or diagnosis of SARS-CoV-2 by FDA under an Emergency Use Authorization (EUA). This EUA will remain  in effect (meaning this test can be used) for the duration of the COVID-19 declaration under Section 564(b)(1) of the Act, 21 U.S.C. section 360bbb-3(b)(1), unless the authorization is terminated or revoked sooner.    Influenza A by PCR NEGATIVE NEGATIVE   Influenza B by PCR NEGATIVE NEGATIVE    Comment: (NOTE) The Xpert Xpress SARS-CoV-2/FLU/RSV assay is intended as an aid in  the diagnosis of influenza from Nasopharyngeal swab specimens and  should not be used as a sole basis for treatment. Nasal washings and  aspirates are unacceptable for Xpert Xpress SARS-CoV-2/FLU/RSV  testing. Fact Sheet for Patients: https://www.moore.com/ Fact Sheet for Healthcare Providers: https://www.young.biz/ This test is not yet approved or cleared by the [place] FDA and  has been authorized for detection and/or diagnosis of SARS-CoV-2 by  FDA under an Emergency Use Authorization (EUA). This EUA will remain  in effect (meaning this test can be used) for the duration of the  Covid-19 declaration under Section 564(b)(1) of the Act, 21  U.S.C. section 360bbb-3(b)(1), unless the authorization is  terminated or revoked. Performed at [facility] [address] [place], [place] [address]     Blood Alcohol level:  Lab Results  Component Value Date   [facility] <10 11/09/2019   ETH <5 08/11/2016    Metabolic Disorder Labs:  Lab Results  Component Value Date   HGBA1C 5.4 08/15/2016   MPG 108 08/15/2016   MPG 123 11/26/2015   Lab Results  Component Value Date   PROLACTIN 33.2 (H) 08/15/2016   Lab Results  Component Value Date   CHOL 158 08/15/2016   TRIG 122 08/15/2016   HDL 39 (L) 08/15/2016   CHOLHDL 4.1 08/15/2016   VLDL 24 08/15/2016   LDLCALC 95 08/15/2016   LDLCALC 169 (H)  11/26/2015    Current Medications: Current Facility-Administered Medications  Medication Dose Route Frequency Provider Last Rate Last Admin  . acetaminophen (TYLENOL) tablet 650 mg  650 mg Oral Q6H PRN Money, Gerlene Burdock, FNP      . alum & mag hydroxide-simeth (MAALOX/MYLANTA) 200-200-20 MG/5ML  suspension 30 mL  30 mL Oral Q4H PRN Money, Gerlene Burdock, FNP      . buPROPion [facility] SR) 12 hr tablet 100 mg  100 mg Oral Daily Money, Travis B, FNP   100 mg at 11/10/19 1115  . divalproex (DEPAKOTE) DR tablet 500 mg  500 mg Oral QPM Money, Gerlene Burdock, FNP      . hydrOXYzine (ATARAX/VISTARIL) tablet 25 mg  25 mg Oral TID PRN Money, Gerlene Burdock, FNP      . magnesium hydroxide (MILK OF MAGNESIA) suspension 30 mL  30 mL Oral Daily PRN Money, Gerlene Burdock, FNP      . QUEtiapine (SEROQUEL) tablet 300 mg  300 mg Oral QHS Money, Travis B, FNP      . traZODone (DESYREL) tablet 100 mg  100 mg Oral QHS PRN Money, Gerlene Burdock, FNP       PTA Medications: Medications Prior to Admission  Medication Sig Dispense Refill Last Dose  . buPROPion (WELLBUTRIN) 100 MG tablet Take 1 tablet (100 mg total) by mouth daily. For depression 30 tablet 0   . cephALEXin (KEFLEX) 500 MG capsule 500 mg BID x 10 days (Patient not taking: Reported on 11/10/2019) 20 capsule 0 Not Taking  . divalproex (DEPAKOTE ER) 250 MG 24 hr tablet Take 3 tablets (750 mg total) by mouth daily at 8 pm. For mood stabilization 90 tablet 0   . hydrOXYzine (ATARAX/VISTARIL) 25 MG tablet Take 1 tablet (25 mg total) by mouth every 6 (six) hours as needed for anxiety. 60 tablet 0   . QUEtiapine (SEROQUEL) 50 MG tablet Take 7 tablets (350 mg total) by mouth at bedtime. For mood control 210 tablet 0   . traZODone (DESYREL) 100 MG tablet Take 1 tablet (100 mg total) by mouth at bedtime as needed for sleep. 30 tablet 0     Musculoskeletal: Strength & Muscle Tone: within normal limits Gait & Station: normal Patient leans: N/A  Psychiatric Specialty Exam: Physical Exam   Constitutional: He appears well-developed and well-nourished.  HENT:  Head: Normocephalic and atraumatic.  Eyes: Pupils are equal, round, and reactive to light. EOM are normal.  Cardiovascular: Normal rate and regular rhythm.  Respiratory: Effort normal and breath sounds normal.  GI: Soft.  Musculoskeletal:     Cervical back: Normal range of motion.  Skin: Skin is warm and dry.    Review of Systems  Constitutional: Negative for appetite change, fatigue and fever.  HENT: Negative for trouble swallowing.   Eyes: Negative for visual disturbance.  Respiratory: Negative for cough and shortness of breath.   Cardiovascular: Negative for chest pain.  Gastrointestinal: Negative for abdominal pain and diarrhea.  Skin: Negative for rash.  Psychiatric/Behavioral: Positive for dysphoric mood, hallucinations, sleep disturbance and suicidal ideas. Negative for agitation and behavioral problems. The patient is nervous/anxious.     Blood pressure 107/65, pulse 79, temperature 98.6 F (37 C), temperature source Oral, resp. rate 16, height 6\' 1"  (1.854 m), weight 97.5 kg, SpO2 100 %.Body mass index is 28.37 kg/m.  General Appearance: Disheveled  Eye Contact:  Good  Speech:  Normal Rate  Volume:  Decreased  Mood:  Dysphoric  Affect:  Constricted  Thought Process:  Coherent  Orientation:  Full (Time, Place, and Person)  Thought Content:  Hallucinations: Auditory and Paranoid Ideation  Suicidal Thoughts:  Yes.  without intent/plan  Homicidal Thoughts:  not currently  Memory:  Immediate;   Fair Recent;   Fair Remote;   Fair  Judgement: Limited  Insight:  Fair  Psychomotor Activity:  Decreased  Concentration:  Concentration: Fair and Attention Span: Fair  Recall:  [employer] of Knowledge:  Fair  Language:  Fair  Akathisia:  No  Handed:  Right  AIMS (if indicated):     Assets:  Communication Skills Desire for Improvement  ADL's:  Intact  Cognition:  WNL  Sleep:       Treatment Plan  Summary: Daily contact with patient to assess and evaluate symptoms and progress in treatment and Medication management    24yo AAM with a past psychiatric history of Schizoaffective disorder, who was admitted from ER to inpatient [place] unit today due to psychotic symptoms, suicidal and homicidal ideations in settings of being off psychotropic medications for about two months. Patient reports depressed and anxious mood, mood swings, irritability, suicidal ideas without plan, auditory hallucinations in settings of being off psychotropic medications for at least two months. Patient`s last psych med combination: Seroquel, Wellbutrin, Depakote, Trazodone, Vistaril, restarted in ED today and orders transferred to [facility] unit on admission. Will continue Seroquel 300mg  PO QHS, increase Depakote to 750mg  PO QHS, continue PRN Trazodone and Vistaril. Will hold Wellbutrin for now as it can be too activating. Will order EKG. VPA level will be checked the following week. Patient will be monitored for safety via 15-minute checks; daily contact with patient to assess and evaluate symptoms and progress in treatment; psychoeducation. Patient needs to reestablish connection with prior outpatient [place] agency.     Observation Level/Precautions:  15 minute checks  Laboratory:  N/A  Psychotherapy:    Medications:    Consultations:    Discharge Concerns:    Estimated LOS:  Other:     Physician Treatment Plan for Primary Diagnosis: <principal problem not specified> Long Term Goal(s): Improvement in symptoms so as ready for discharge  Short Term Goals: Ability to identify changes in lifestyle to reduce recurrence of condition will improve, Ability to verbalize feelings will improve, Ability to disclose and discuss suicidal ideas, Ability to demonstrate self-control will improve, Ability to identify and develop effective coping behaviors will improve, Ability to maintain clinical measurements within normal limits will improve,  Compliance with prescribed medications will improve and Ability to identify triggers associated with substance abuse/mental health issues will improve  Physician Treatment Plan for Secondary Diagnosis: Active Problems:   Schizoaffective disorder ([place])  Long Term Goal(s): Improvement in symptoms so as ready for discharge  Short Term Goals: Ability to identify changes in lifestyle to reduce recurrence of condition will improve, Ability to verbalize feelings will improve, Ability to disclose and discuss suicidal ideas, Ability to demonstrate self-control will improve, Ability to identify and develop effective coping behaviors will improve, Ability to maintain clinical measurements within normal limits will improve, Compliance with prescribed medications will improve and Ability to identify triggers associated with substance abuse/mental health issues will improve  I certify that inpatient services furnished can reasonably be expected to improve the patient's condition.    Larita Fife, MD 5/8/202112:10 PM

## 2019-11-10 NOTE — Tx Team (Signed)
Initial Treatment Plan 11/10/2019 10:05 AM Timothy [name] GBE:[id number]    PATIENT STRESSORS: Marital or family conflict Medication change or noncompliance   PATIENT STRENGTHS: Ability for insight Active sense of humor Average or above average intelligence   PATIENT IDENTIFIED PROBLEMS: Unstable Mood  Ineffective Coping skills                   DISCHARGE CRITERIA:  Ability to meet basic life and health needs Adequate post-discharge living arrangements Improved stabilization in mood, thinking, and/or behavior  PRELIMINARY DISCHARGE PLAN: Return to previous living arrangement Return to previous work or school arrangements  PATIENT/FAMILY INVOLVEMENT: This treatment plan has been presented to and reviewed with the patient, Timothy [name].  The patient and family have been given the opportunity to ask questions and make suggestions.  Berkley Harvey, RN 11/10/2019, 10:05 AM

## 2019-11-10 NOTE — ED Provider Notes (Signed)
Emergency Medicine Observation Re-evaluation Note  Timothy [name] is a 24 y.o. male, seen on rounds today.  Pt initially presented to the ED for complaints of mental health evaluation Currently, the patient is resting.  Physical Exam  BP 125/77 (BP Location: Left Arm)   Pulse (!) 56   Temp 98.9 F (37.2 C) (Oral)   Resp 18   Ht 1.905 m (6\' 3" )   Wt 104.3 kg   SpO2 99%   BMI 28.75 kg/m  Physical Exam  ED Course / MDM  EKG:    I have reviewed the labs performed to date as well as medications administered while in observation.  Recent changes in the last 24 hours include awaiting placement. Plan  Current plan is for psychiatric disposition. Patient is not under full IVC at this time.   , MD 11/10/19 [phone]

## 2019-11-10 NOTE — Progress Notes (Signed)
Patient admitted to unit. Alert and orient Denies SI, HI, visual hallucinations. Endorses AH. Patient admitted due to hearing voices and requesting to get back on medications. Patient reports not taking meds for last 2-3 months or so. Reports he wants the voices to calm down. No commands just talking is what he hears. Patient pleasant and cooperative. Skin and contraband search completed and witnessed by Lorenda Hatchet, Charity fundraiser. No contraband found. Patient has dark healed bumps or scars all over back and shoulders. Patient reports unsure as to what they are from. Patient also has dry craked feet and large callous to side or Right foot. Oriented patient to room and unit, fluids and nutrition provided. Pt remains safe on unit with q 15 min checks.

## 2019-11-10 NOTE — Progress Notes (Signed)
At patients request, mom came to get wallet with 205$ in and debit cards to pay patient rent and phone bill.

## 2019-11-10 NOTE — ED Notes (Signed)
Pt verbalized understanding for need to transport to lower level [facility] Med Unit for further evaluation. Pt transported with hospital security and ED Tech.

## 2019-11-10 NOTE — [facility] Suicide Risk Assessment (Signed)
[facility] Admission Suicide Risk Assessment   Nursing information obtained from:  Patient Demographic factors:  Male Current Mental Status:  NA Loss Factors:  NA Historical Factors:  NA Risk Reduction Factors:  NA  Total Time spent with patient: 30 minutes Principal Problem: <principal problem not specified> Diagnosis:  Active Problems:   Schizoaffective disorder (HCC)  Subjective Data:   24yo AAM with a past psychiatric history of Schizoaffective disorder, who was admitted from ER to inpatient BH unit today due to psychotic symptoms, suicidal and homicidal ideations in settings of being off psychotropic medications for about two months. Patient reports depressed and anxious mood, mood swings, irritability, suicidal ideas without plan, auditory hallucinations in settings of being off psychotropic medications for at least two months.   CLINICAL FACTORS:   Schizophrenia:   Depressive state   COGNITIVE FEATURES THAT CONTRIBUTE TO RISK:  None    SUICIDE RISK:   Moderate:  Frequent suicidal ideation with limited intensity, and duration, some specificity in terms of plans, no associated intent, good self-control, limited dysphoria/symptomatology, some risk factors present, and identifiable protective factors, including available and accessible social support.  Suicide Risk Assessment: prior suicidal attempts, age, male gender - represent non-modifiable/baseline risk factors. Presence of mental disorder, alcohol/substance use, hopelessness, impulsivity - are dynamic risk factors. The patient denies denies access to firearm. Patient is future-oriented, help-seeking - protective factors. Therefore, represents a moderate risk for harming self acutely and elevated chronic risk due to non-modifiable risk factors.   PLAN OF CARE: Patient will be monitored for safety via 15-minute checks; daily contact with patient to assess and evaluate symptoms and progress in treatment; psychoeducation. Patient  needs to reestablish connection with prior outpatient MH agency.   I certify that inpatient services furnished can reasonably be expected to improve the patient's condition.   Thalia Party, MD 11/10/2019, 1:34 PM

## 2019-11-10 NOTE — Consult Note (Signed)
Spoke with patient this morning he is in agreement with being admitted.  Patient reported that he has not been on his medications for approximately 1 to 2 months and reports that he had continued to be on his Depakote, Seroquel, Wellbutrin, and trazodone.  There is a been restarted in his son and had orders for when he is admitted to the inpatient unit.  Patient has been accepted to bed 305 on the BMU.  Orders have been placed and report can be called to the BMU at any time.

## 2019-11-11 LAB — TSH: TSH: 1.155 u[IU]/mL (ref 0.350–4.500)

## 2019-11-11 LAB — LIPID PANEL
Cholesterol: 163 mg/dL (ref 0–200)
HDL: 39 mg/dL — ABNORMAL LOW (ref >40)
LDL Cholesterol: 108 mg/dL — ABNORMAL HIGH (ref 0–99)
Total CHOL/HDL Ratio: 4.2 RATIO
Triglycerides: 81 mg/dL (ref <150)
VLDL: 16 mg/dL (ref 0–40)

## 2019-11-11 NOTE — Progress Notes (Signed)
Patient isolative to self. Out in the day room. Admits to hearing voices. Minimal interaction with staff and peers. Forwards little in conversation. Denies SI and HI

## 2019-11-11 NOTE — Progress Notes (Addendum)
Pt is alert and oriented to person, place, time and situation. Pt is calm, cooperative, affect is blunted, eye contact is good. Pt denies homicidal ideation, endorses suicidal ideation without plan or intent when asked by the the psychiatrist seeing pt at same time as this Clinical research associate, when she asked pt if he could keep self safe while here, he replies "yes." Pt endorses having auditory hallucinations, reports he is "hearing voices," but reports hearing them less often than yesterday. Pt isolates in his room often, resting quietly, and at other times sleeping in his room. Pt is noted to be thought blocking, slow to process or respond, stares inappropriately. No distress noted, none reported. Will continue to monitor pt per Q15 minute face checks and monitor for safety and progress.

## 2019-11-11 NOTE — [facility] Group Notes (Signed)
[facility] LCSW Group Therapy Note  11/11/2019    Type of Therapy and Topic:  Group Therapy:  A Hero Worthy of Support  Participation Level:  Did Not Attend   Description of Group:  Patients in this group were introduced to the concept that additional supports including self-support are an essential part of recovery.  Matching needs with supports to help fulfill those needs was explained.  Establishing boundaries that can gradually be increased or decreased was described, with patients giving their own examples of establishing appropriate boundaries in their lives.  A song entitled "My Own Hero" was played and a group discussion ensued in which patients stated it inspired them to help themselves in order to succeed, because other people cannot achieve their goals such as sobriety or stability for them.  A song was played called "I Am Enough" which led to a discussion about being willing to believe we are worth the effort of being a self-support.   Therapeutic Goals: 1)  demonstrate the importance of being a key part of one's own support system 2)  discuss various available supports 3)  encourage patient to use music as part of their self-support and focus on goals 4)  elicit ideas from patients about supports that need to be added   Summary of Patient Progress:  The patient was invited to group and opted not to participate.   Therapeutic Modalities:   Motivational Interviewing Activity  Lynnell Chad

## 2019-11-11 NOTE — Progress Notes (Signed)
Per psychiatrist/MD request made for this writer to complete a CIWA score due pt's history of ETOH abuse, and pt's c/o of feeling, "shakes" and c/o "itching" on his bilateral arms and abdomen, upon skin assessment, no rash is visible.   Pt CIWA score = 3 (See CIWA assessment form for more details).   Will continue to monitor pt per Q15 minute face checks and monitor for safety and progress.

## 2019-11-11 NOTE — Progress Notes (Signed)
Patient isolative to his room this evening. The RN that had him gave medications before she had to leave. Patient asleep and is without complaints at this time. Patient being monitored Q 15 minutes for safety per unit protocol. Patient remains safe on the unit.

## 2019-11-11 NOTE — Progress Notes (Signed)
[facility] MD Progress Note  11/11/2019 9:51 AM Timothy [name]  MRN:  [id number] Mr. Chavarin is a 23yo AAM with a past psychiatric history of Schizoaffective disorder, who was admitted from ER to inpatient BH unit due to psychotic symptoms, suicidal and homicidal ideations in settings of being off psychotropic medications for about two months.  Patient seen.  Chart reviewed. Patient discussed with nursing; no overnight events reported. PER RN report "Patient isolative to self. Admits to hearing voices. Minimal interaction with staff and peers. Forwards little in conversation. Denies SI and HI."  Subjective:  Patient reports feeling "same", "depressed". Continues to report feeling suicidal, states he is safe in the hospital and has no plan. Reports feeling less paranoid and states "voices" are "better" too. Continues to complaint of high anxiety. Reports racing thoughts. Patient repots feeling "shaky" and reports mild skin itchiness. His last drink was three days ago and he might be withdrawing from alcohol, so we will monitor him on CIWA protocol. We discussed that he can use Hydroxyzine for itch (no objective skin lesions found on examination which performed together with RN).  Principal Problem: <principal problem not specified> Diagnosis: Active Problems:   Schizoaffective disorder (HCC)  Total Time spent with patient: 15 minutes  Past Psychiatric History: see H&P  Past Medical History:  Past Medical History:  Diagnosis Date  . Cannabis abuse   . Paranoid schizophrenia (HCC)   . Schizoaffective disorder, depressive type (HCC) 09/17/2014   History reviewed. No pertinent surgical history. Family History:  Family History  Problem Relation Age of Onset  . Mental illness Brother   . Drug abuse Brother   . Mental illness Cousin   . Suicidality Cousin   . Diabetes Maternal Grandmother    Family Psychiatric  History: see H&P Social History:  Social History   Substance and Sexual Activity   Alcohol Use Yes  . Alcohol/week: 1.0 standard drinks  . Types: 1 Shots of liquor per week     Social History   Substance and Sexual Activity  Drug Use Yes  . Types: Marijuana    Social History   Socioeconomic History  . Marital status: Single    Spouse name: Not on file  . Number of children: Not on file  . Years of education: Not on file  . Highest education level: Not on file  Occupational History  . Not on file  Tobacco Use  . Smoking status: Current Some Day Smoker    Packs/day: 0.25  . Smokeless tobacco: Never Used  Substance and Sexual Activity  . Alcohol use: Yes    Alcohol/week: 1.0 standard drinks    Types: 1 Shots of liquor per week  . Drug use: Yes    Types: Marijuana  . Sexual activity: Never  Other Topics Concern  . Not on file  Social History Narrative  . Not on file   Social Determinants of Health   Financial Resource Strain:   . Difficulty of Paying Living Expenses:   Food Insecurity:   . Worried About Programme researcher, broadcasting/film/video in the Last Year:   . Barista in the Last Year:   Transportation Needs:   . Freight forwarder (Medical):   Marland Kitchen Lack of Transportation (Non-Medical):   Physical Activity:   . Days of Exercise per Week:   . Minutes of Exercise per Session:   Stress:   . Feeling of Stress :   Social Connections:   . Frequency of Communication with Friends  and Family:   . Frequency of Social Gatherings with Friends and Family:   . Attends Religious Services:   . Active Member of Clubs or Organizations:   . Attends Banker Meetings:   Marland Kitchen Marital Status:    Additional Social History:                         Sleep: Fair  Appetite:  Fair  Current Medications: Current Facility-Administered Medications  Medication Dose Route Frequency Provider Last Rate Last Admin  . acetaminophen (TYLENOL) tablet 650 mg  650 mg Oral Q6H PRN Money, Gerlene Burdock, FNP      . alum & mag hydroxide-simeth (MAALOX/MYLANTA)  200-200-20 MG/5ML suspension 30 mL  30 mL Oral Q4H PRN Money, Feliz Beam B, FNP      . divalproex (DEPAKOTE) DR tablet 750 mg  750 mg Oral QPM Graves Nipp, MD   750 mg at 11/10/19 1739  . hydrOXYzine (ATARAX/VISTARIL) tablet 25 mg  25 mg Oral TID PRN Money, Gerlene Burdock, FNP      . magnesium hydroxide (MILK OF MAGNESIA) suspension 30 mL  30 mL Oral Daily PRN Money, Gerlene Burdock, FNP      . QUEtiapine (SEROQUEL) tablet 300 mg  300 mg Oral QHS Money, Gerlene Burdock, FNP   300 mg at 11/10/19 2134  . traZODone (DESYREL) tablet 100 mg  100 mg Oral QHS PRN Money, Gerlene Burdock, FNP   100 mg at 11/10/19 2134    Lab Results:  Results for orders placed or performed during the hospital encounter of 11/10/19 (from the past 48 hour(s))  Valproic acid level     Status: Abnormal   Collection Time: 11/10/19  1:09 PM  Result Value Ref Range   Valproic Acid Lvl <10 (L) 50.0 - 100.0 ug/mL    Comment: Performed at [facility], [address] [place], [place] [address]  TSH     Status: None   Collection Time: 11/11/19  6:46 AM  Result Value Ref Range   TSH 1.155 0.350 - 4.500 uIU/mL    Comment: Performed by a 3rd Generation assay with a functional sensitivity of <=0.01 uIU/mL. Performed at [facility], [address] [place], [place] [address]   Lipid panel     Status: Abnormal   Collection Time: 11/11/19  6:46 AM  Result Value Ref Range   Cholesterol 163 0 - 200 mg/dL   Triglycerides 81 <791 mg/dL   HDL 39 (L) >50 mg/dL   Total CHOL/HDL Ratio 4.2 RATIO   VLDL 16 0 - 40 mg/dL   LDL Cholesterol 569 (H) 0 - 99 mg/dL    Comment:        Total Cholesterol/HDL:CHD Risk Coronary Heart Disease Risk Table                     Men   Women  1/2 Average Risk   3.4   3.3  Average Risk       5.0   4.4  2 X Average Risk   9.6   7.1  3 X Average Risk  23.4   11.0        Use the calculated Patient Ratio above and the CHD Risk Table to determine the patient's CHD Risk.        ATP III CLASSIFICATION (LDL):   <100     mg/dL   Optimal  794-801  mg/dL   Near or Above  Optimal  130-159  mg/dL   Borderline  160-189  mg/dL   High  >190     mg/dL   Very High Performed at [facility], [address] [place], [place] [address]     Blood Alcohol level:  Lab Results  Component Value Date   [facility] <10 11/09/2019   ETH <5 60/45/4098    Metabolic Disorder Labs: Lab Results  Component Value Date   HGBA1C 5.4 08/15/2016   MPG 108 08/15/2016   MPG 123 11/26/2015   Lab Results  Component Value Date   PROLACTIN 33.2 (H) 08/15/2016   Lab Results  Component Value Date   CHOL 163 11/11/2019   TRIG 81 11/11/2019   HDL 39 (L) 11/11/2019   CHOLHDL 4.2 11/11/2019   VLDL 16 11/11/2019   LDLCALC 108 (H) 11/11/2019   LDLCALC 95 08/15/2016    Physical Findings: AIMS: Facial and Oral Movements Muscles of Facial Expression: None, normal Lips and Perioral Area: None, normal Jaw: None, normal Tongue: None, normal,Extremity Movements Upper (arms, wrists, hands, fingers): None, normal Lower (legs, knees, ankles, toes): None, normal, Trunk Movements Neck, shoulders, hips: None, normal, Overall Severity Severity of abnormal movements (highest score from questions above): None, normal Incapacitation due to abnormal movements: None, normal Patient's awareness of abnormal movements (rate only patient's report): No Awareness, Dental Status Current problems with teeth and/or dentures?: No Does patient usually wear dentures?: No  CIWA:    COWS:  COWS Total Score: 0  Musculoskeletal: Strength & Muscle Tone: within normal limits Gait & Station: normal Patient leans: N/A  Psychiatric Specialty Exam: Physical Exam  Constitutional: He appears well-developed and well-nourished.  HENT:  Head: Normocephalic and atraumatic.  Eyes: Pupils are equal, round, and reactive to light. EOM are normal.  Cardiovascular: Normal rate and regular rhythm.  Respiratory: Effort normal and  breath sounds normal.  GI: Soft.  Musculoskeletal:     Cervical back: Normal range of motion.  Skin: Skin is warm and dry.    Review of Systems  Constitutional: Negative for appetite change, fatigue and fever.  HENT: Negative for trouble swallowing.   Eyes: Negative for visual disturbance.  Respiratory: Negative for cough and shortness of breath.   Cardiovascular: Negative for chest pain.  Gastrointestinal: Negative for abdominal pain and diarrhea.  Skin: Negative for rash.  Psychiatric/Behavioral: Positive for dysphoric mood, hallucinations, sleep disturbance and suicidal ideas. Negative for agitation and behavioral problems. The patient is nervous/anxious.     Blood pressure 107/65, pulse 79, temperature 98.6 F (37 C), temperature source Oral, resp. rate 16, height 6\' 1"  (1.854 m), weight 97.5 kg, SpO2 100 %.Body mass index is 28.37 kg/m.  General Appearance: Disheveled  Eye Contact:  Good  Speech:  Normal Rate  Volume:  Decreased  Mood:  Dysphoric  Affect:  Constricted  Thought Process:  Coherent  Orientation:  Full (Time, Place, and Person)  Thought Content:  Hallucinations: Auditory and Paranoid Ideation  Suicidal Thoughts:  Yes.  without intent/plan  Homicidal Thoughts:  not currently  Memory:  Immediate;   Fair Recent;   Fair Remote;   Fair  Judgement: Limited  Insight:  Fair  Psychomotor Activity:  Decreased  Concentration:  Concentration: Fair and Attention Span: Fair  Recall:  [employer] of Knowledge:  Fair  Language:  Fair  Akathisia:  No  Handed:  Right  AIMS (if indicated):     Assets:  Communication Skills Desire for Improvement  ADL's:  Intact  Cognition:  WNL  Sleep:   8 hours    Treatment Plan Summary: Daily contact with patient to assess and evaluate symptoms and progress in treatment and Medication management    23yo AAM with a past psychiatric history of Schizoaffective disorder, who was admitted from ER to inpatient [facility] unit yesterday due  to psychotic symptoms, suicidal and homicidal ideations in settings of being off psychotropic medications for about two months.  Today patient continues to report depressed and anxious mood, mood swings, suicidal ideas without plan, auditory hallucinations and paranoia, although reports slight improvement of psychotic symptoms (AH, paranoia) after his medications were restarted. Today he also reports feeling subjectively "shaky" and "itchy". Last drink was three days ago, patient will be monitored on CIWA protocol for symptoms of acute alcohol withdrawal. Skin examination performed, no objective lesions found. Patient instructed he can use Hydroxyzine for itch.   Today will continue Seroquel 300mg  PO QHS, and Depakote to 750mg  PO QHS (increased yesterday). Will put the order for VPA level for tomorrow. Continue PRN Trazodone and Vistaril, monitor on CIWA. Patient will be monitored for safety via 15-minute checks; daily contact with patient to assess and evaluate symptoms and progress in treatment; psychoeducation. Patient needs to reestablish connection with prior outpatient MH agency.     , MD 11/11/2019, 9:51 AM

## 2019-11-11 NOTE — Plan of Care (Signed)
  Problem: Education: Goal: Knowledge of [facility] Education information/materials will improve Outcome: Progressing Goal: Emotional status will improve Outcome: Progressing Goal: Mental status will improve Outcome: Progressing Goal: Verbalization of understanding the information provided will improve Outcome: Progressing   

## 2019-11-11 NOTE — Plan of Care (Signed)
Patient isolative and minimal with staff and peers this evening  Problem: Education: Goal: Emotional status will improve Outcome: Not Progressing Goal: Mental status will improve Outcome: Not Progressing

## 2019-11-12 LAB — VALPROIC ACID LEVEL: Valproic Acid Lvl: 56 ug/mL (ref 50.0–100.0)

## 2019-11-12 MED ORDER — BUPROPION HCL ER (SR) 100 MG PO TB12
100.0000 mg | ORAL_TABLET | Freq: Every day | ORAL | Status: DC
  Administered 2019-11-12 – 2019-11-13 (×2): 100 mg via ORAL
  Filled 2019-11-12 (×2): qty 1

## 2019-11-12 NOTE — [facility] Suicide Risk Assessment (Signed)
[facility] INPATIENT:  Family/Significant Other Suicide Prevention Education  Suicide Prevention Education:  Education Completed; Scarlette Slice, mother, [phone] has been identified by the patient as the family member/significant other with whom the patient will be residing, and identified as the person(s) who will aid the patient in the event of a mental health crisis (suicidal ideations/suicide attempt).  With written consent from the patient, the family member/significant other has been provided the following suicide prevention education, prior to the and/or following the discharge of the patient.  The suicide prevention education provided includes the following:  Suicide risk factors  Suicide prevention and interventions  National Suicide Hotline telephone number  [facility] assessment telephone number  [facility] Emergency Assistance 911  [facility] and/or Residential Mobile Crisis Unit telephone number  Request made of family/significant other to:  Remove weapons (e.g., guns, rifles, knives), all items previously/currently identified as safety concern.    Remove drugs/medications (over-the-counter, prescriptions, illicit drugs), all items previously/currently identified as a safety concern.  The family member/significant other verbalizes understanding of the suicide prevention education information provided.  The family member/significant other agrees to remove the items of safety concern listed above.  CSW spoke with the patient's mother.  Per the mother she believes the patient ran out of medications or stopped taking them leading to decompensation.  She reports that she was told that the patient was acting delusional and was taken to the hospital.  She reports that the patient does not have access to weapons and she does not feel that he is a danger to self or others.  She reports that patient will need doctor's note at discharge.    Harden Mo 11/12/2019, 3:24 PM

## 2019-11-12 NOTE — Plan of Care (Signed)
Patient verbalized minimal withdrawal symptoms today.Stated that that the voices are minimal. Patient stated that he feels better and he is on his meds,now he is ready for discharge.Patient is more visible in the milieu in the evening and more interacting with staff & peers.Appetite and energy level good.Compliant with medications.Support and encouragement given.

## 2019-11-12 NOTE — Tx Team (Addendum)
Interdisciplinary Treatment and Diagnostic Plan Update  11/12/2019 Time of Session: 9:00AM Timothy [name] MRN: [id number]  Principal Diagnosis: <principal problem not specified>  Secondary Diagnoses: Active Problems:   Schizoaffective disorder (HCC)   Current Medications:  Current Facility-Administered Medications  Medication Dose Route Frequency Provider Last Rate Last Admin  . acetaminophen (TYLENOL) tablet 650 mg  650 mg Oral Q6H PRN Money, Gerlene Burdock, FNP      . alum & mag hydroxide-simeth (MAALOX/MYLANTA) 200-200-20 MG/5ML suspension 30 mL  30 mL Oral Q4H PRN Money, Gerlene Burdock, FNP      . buPROPion [facility] SR) 12 hr tablet 100 mg  100 mg Oral Daily [name], MD      . divalproex (DEPAKOTE) DR tablet 750 mg  750 mg Oral QPM Thalia Party, MD   750 mg at 11/11/19 1708  . hydrOXYzine (ATARAX/VISTARIL) tablet 25 mg  25 mg Oral TID PRN Money, Gerlene Burdock, FNP      . magnesium hydroxide (MILK OF MAGNESIA) suspension 30 mL  30 mL Oral Daily PRN Money, Gerlene Burdock, FNP      . QUEtiapine (SEROQUEL) tablet 300 mg  300 mg Oral QHS Money, Gerlene Burdock, FNP   300 mg at 11/11/19 2119  . traZODone (DESYREL) tablet 100 mg  100 mg Oral QHS PRN Money, Gerlene Burdock, FNP   100 mg at 11/11/19 2119   PTA Medications: Medications Prior to Admission  Medication Sig Dispense Refill Last Dose  . buPROPion (WELLBUTRIN) 100 MG tablet Take 1 tablet (100 mg total) by mouth daily. For depression 30 tablet 0   . cephALEXin (KEFLEX) 500 MG capsule 500 mg BID x 10 days (Patient not taking: Reported on 11/10/2019) 20 capsule 0 Not Taking  . divalproex (DEPAKOTE ER) 250 MG 24 hr tablet Take 3 tablets (750 mg total) by mouth daily at 8 pm. For mood stabilization 90 tablet 0   . hydrOXYzine (ATARAX/VISTARIL) 25 MG tablet Take 1 tablet (25 mg total) by mouth every 6 (six) hours as needed for anxiety. 60 tablet 0   . QUEtiapine (SEROQUEL) 50 MG tablet Take 7 tablets (350 mg total) by mouth at bedtime. For mood control 210  tablet 0   . traZODone (DESYREL) 100 MG tablet Take 1 tablet (100 mg total) by mouth at bedtime as needed for sleep. 30 tablet 0     Patient Stressors: Marital or family conflict Medication change or noncompliance  Patient Strengths: Ability for insight Active sense of humor Average or above average intelligence  Treatment Modalities: Medication Management, Group therapy, Case management,  1 to 1 session with clinician, Psychoeducation, Recreational therapy.   Physician Treatment Plan for Primary Diagnosis: <principal problem not specified> Long Term Goal(s): Improvement in symptoms so as ready for discharge Improvement in symptoms so as ready for discharge   Short Term Goals: Ability to identify changes in lifestyle to reduce recurrence of condition will improve Ability to verbalize feelings will improve Ability to disclose and discuss suicidal ideas Ability to demonstrate self-control will improve Ability to identify and develop effective coping behaviors will improve Ability to maintain clinical measurements within normal limits will improve Compliance with prescribed medications will improve Ability to identify triggers associated with substance abuse/mental health issues will improve Ability to identify changes in lifestyle to reduce recurrence of condition will improve Ability to verbalize feelings will improve Ability to disclose and discuss suicidal ideas Ability to demonstrate self-control will improve Ability to identify and develop effective coping behaviors will improve Ability  to maintain clinical measurements within normal limits will improve Compliance with prescribed medications will improve Ability to identify triggers associated with substance abuse/mental health issues will improve  Medication Management: Evaluate patient's response, side effects, and tolerance of medication regimen.  Therapeutic Interventions: 1 to 1 sessions, Unit Group sessions and  Medication administration.  Evaluation of Outcomes: Progressing  Physician Treatment Plan for Secondary Diagnosis: Active Problems:   Schizoaffective disorder (HCC)  Long Term Goal(s): Improvement in symptoms so as ready for discharge Improvement in symptoms so as ready for discharge   Short Term Goals: Ability to identify changes in lifestyle to reduce recurrence of condition will improve Ability to verbalize feelings will improve Ability to disclose and discuss suicidal ideas Ability to demonstrate self-control will improve Ability to identify and develop effective coping behaviors will improve Ability to maintain clinical measurements within normal limits will improve Compliance with prescribed medications will improve Ability to identify triggers associated with substance abuse/mental health issues will improve Ability to identify changes in lifestyle to reduce recurrence of condition will improve Ability to verbalize feelings will improve Ability to disclose and discuss suicidal ideas Ability to demonstrate self-control will improve Ability to identify and develop effective coping behaviors will improve Ability to maintain clinical measurements within normal limits will improve Compliance with prescribed medications will improve Ability to identify triggers associated with substance abuse/mental health issues will improve     Medication Management: Evaluate patient's response, side effects, and tolerance of medication regimen.  Therapeutic Interventions: 1 to 1 sessions, Unit Group sessions and Medication administration.  Evaluation of Outcomes: Progressing   RN Treatment Plan for Primary Diagnosis: <principal problem not specified> Long Term Goal(s): Knowledge of disease and therapeutic regimen to maintain health will improve  Short Term Goals: Ability to participate in decision making will improve, Ability to verbalize feelings will improve, Ability to disclose and discuss  suicidal ideas, Ability to identify and develop effective coping behaviors will improve and Compliance with prescribed medications will improve  Medication Management: RN will administer medications as ordered by provider, will assess and evaluate patient's response and provide education to patient for prescribed medication. RN will report any adverse and/or side effects to prescribing provider.  Therapeutic Interventions: 1 on 1 counseling sessions, Psychoeducation, Medication administration, Evaluate responses to treatment, Monitor vital signs and CBGs as ordered, Perform/monitor CIWA, COWS, AIMS and Fall Risk screenings as ordered, Perform wound care treatments as ordered.  Evaluation of Outcomes: Progressing   LCSW Treatment Plan for Primary Diagnosis: <principal problem not specified> Long Term Goal(s): Safe transition to appropriate next level of care at discharge, Engage patient in therapeutic group addressing interpersonal concerns.  Short Term Goals: Engage patient in aftercare planning with referrals and resources, Increase social support, Increase emotional regulation, Facilitate patient progression through stages of change regarding substance use diagnoses and concerns and Identify triggers associated with mental health/substance abuse issues  Therapeutic Interventions: Assess for all discharge needs, 1 to 1 time with Social worker, Explore available resources and support systems, Assess for adequacy in community support network, Educate family and significant other(s) on suicide prevention, Complete Psychosocial Assessment, Interpersonal group therapy.  Evaluation of Outcomes: Progressing   Progress in Treatment: Attending groups: Yes. Participating in groups: Yes. Taking medication as prescribed: Yes. Toleration medication: Yes. Family/Significant other contact made: Yes, individual(s) contacted:  SPE completed with the patient.  Attempts will be made for collateral contact.   Patient understands diagnosis: Yes. Discussing patient identified problems/goals with staff: Yes. Medical problems stabilized or resolved:  Yes. Denies suicidal/homicidal ideation: Yes. Issues/concerns per patient self-inventory: No. Other: none  New problem(s) identified: No, Describe:  none  New Short Term/Long Term Goal(s): elimination of symptoms of psychosis, medication management for mood stabilization; elimination of SI thoughts; development of comprehensive mental wellness/sobriety plan.  Patient Goals:  "be back on my medications"  Discharge Plan or Barriers: Patient reports plans to return home and continue outpatient services with [facility]   Reason for Continuation of Hospitalization: Aggression Depression Medication stabilization Suicidal ideation  Estimated Length of Stay: 1-7 days  Recreational Therapy: Patient Stressors: N/A Patient Goal: Patient will engage in groups without prompting or encouragement from LRT x3 group sessions within 5 recreation therapy group sessions  Attendees: Patient: Timothy [name] 11/12/2019 10:41 AM  Physician: Dr. Adalberto Cole, MD 11/12/2019 10:41 AM  Nursing: West Pugh, RN 11/12/2019 10:41 AM  RN Care Manager: 11/12/2019 10:41 AM  Social Worker: Assunta Curtis, LCSW 11/12/2019 10:41 AM  Recreational Therapist: Roanna Epley, Reather Converse, LRT 11/12/2019 10:41 AM  Other: Sanjuana Kava, LCSW 11/12/2019 10:41 AM  Other:  11/12/2019 10:41 AM  Other: 11/12/2019 10:41 AM    Scribe for Treatment Team: Rozann Lesches, LCSW 11/12/2019 10:41 AM

## 2019-11-12 NOTE — [facility] Group Notes (Signed)
LCSW Group Therapy Note   11/12/2019 3:08 PM  Type of Therapy and Topic:  Group Therapy:  Overcoming Obstacles   Participation Level:  Did Not Attend   Description of Group:    In this group patients will be encouraged to explore what they see as obstacles to their own wellness and recovery. They will be guided to discuss their thoughts, feelings, and behaviors related to these obstacles. The group will process together ways to cope with barriers, with attention given to specific choices patients can make. Each patient will be challenged to identify changes they are motivated to make in order to overcome their obstacles. This group will be process-oriented, with patients participating in exploration of their own experiences as well as giving and receiving support and challenge from other group members.   Therapeutic Goals: 1. Patient will identify personal and current obstacles as they relate to admission. 2. Patient will identify barriers that currently interfere with their wellness or overcoming obstacles.  3. Patient will identify feelings, thought process and behaviors related to these barriers. 4. Patient will identify two changes they are willing to make to overcome these obstacles:      Summary of Patient Progress X   Therapeutic Modalities:   Cognitive Behavioral Therapy Solution Focused Therapy Motivational Interviewing Relapse Prevention Therapy  Penni Homans, MSW, LCSW 11/12/2019 3:08 PM

## 2019-11-12 NOTE — Progress Notes (Signed)
Recreation Therapy Notes  Date: 11/12/2019   Time: 9:30 am  Location: Craft room   Behavioral response: Appropriate   Intervention Topic: Self-care    Discussion/Intervention:  Group content today was focused on Self-Care. The group defined self-care and some positive ways they care for themselves. Individuals expressed ways and reasons why they neglected any self-care in the past. Patients described ways to improve self-care in the future. The group explained what could happen if they did not do any self-care activities at all. The group participated in the intervention "self-care assessment" where they had a chance to discover some of their weaknesses and strengths in self- care. Patient came up with a self-care plan to improve themselves in the future.  Clinical Observations/Feedback:  Patient came to group and defined self-care as keeping everything on track. He stated that he takes care of himself by exercising and drawing. Participant stated that hygiene is very important when taking care of yourself. Individual was social with peers and staff while participating in the intervention.   Sarabi Sockwell LRT/CTRS             Priyah Schmuck 11/12/2019 12:06 PM

## 2019-11-12 NOTE — [facility] Suicide Risk Assessment (Signed)
[facility] INPATIENT:  Family/Significant Other Suicide Prevention Education  Suicide Prevention Education:  Contact Attempts: Scarlette Slice, mother at [phone], has been identified by the patient as the family member/significant other with whom the patient will be residing, and identified as the person(s) who will aid the patient in the event of a mental health crisis.  With written consent from the patient, two attempts were made to provide suicide prevention education, prior to and/or following the patient's discharge.  We were unsuccessful in providing suicide prevention education.  A suicide education pamphlet was given to the patient to share with family/significant other.  Date and time of first attempt: 11/12/2019 at 10:32AM Date and time of second attempt: Second attempt is needed  CSW left HIPAA compliant voicemail.  Harden Mo 11/12/2019, 10:32 AM

## 2019-11-12 NOTE — Progress Notes (Signed)
[facility] MD Progress Note  11/12/2019 10:23 AM Timothy [name]  MRN:  [id number] Subjective: Patient is a 24 year old male with a reported past psychiatric history significant for schizoaffective disorder who presented to the [facility] on 11/10/2019 with psychotic symptoms, suicidal and homicidal ideation.  He had been noncompliant with his medications.  Objective: Patient is seen and examined.  Patient is a 24 year old male with the above-stated past psychiatric history was seen in follow-up.  He is very isolated and withdrawn.  He stated "I just want to get on the medicines and go home".  He stated that the auditory hallucinations never go away.  He denied any suicidal or homicidal ideation today.  Notes from his progress note from 5/9 stated at that time he felt "the same".  His current medications include Depakote DR, Seroquel and trazodone.  His vital signs are stable, and he is afebrile.  He slept 7.75 hours last night.  Review of his admission laboratories revealed essentially normal electrolytes, normal lipids, normal CBC.  His Depakote level was 56 on 11/12/2019.  TSH was 1.155.  Drug screen was positive for cannabinoids.  He had an EKG from April 12 that showed a sinus rhythm with a normal QTc interval of 388.  Principal Problem: <principal problem not specified> Diagnosis: Active Problems:   Schizoaffective disorder (HCC)  Total Time spent with patient: 20 minutes  Past Psychiatric History: See admission H&P  Past Medical History:  Past Medical History:  Diagnosis Date  . Cannabis abuse   . Paranoid schizophrenia (HCC)   . Schizoaffective disorder, depressive type (HCC) 09/17/2014   History reviewed. No pertinent surgical history. Family History:  Family History  Problem Relation Age of Onset  . Mental illness Brother   . Drug abuse Brother   . Mental illness Cousin   . Suicidality Cousin   . Diabetes Maternal Grandmother    Family Psychiatric  History: See  admission H&P Social History:  Social History   Substance and Sexual Activity  Alcohol Use Yes  . Alcohol/week: 1.0 standard drinks  . Types: 1 Shots of liquor per week     Social History   Substance and Sexual Activity  Drug Use Yes  . Types: Marijuana    Social History   Socioeconomic History  . Marital status: Single    Spouse name: Not on file  . Number of children: Not on file  . Years of education: Not on file  . Highest education level: Not on file  Occupational History  . Not on file  Tobacco Use  . Smoking status: Current Some Day Smoker    Packs/day: 0.25  . Smokeless tobacco: Never Used  Substance and Sexual Activity  . Alcohol use: Yes    Alcohol/week: 1.0 standard drinks    Types: 1 Shots of liquor per week  . Drug use: Yes    Types: Marijuana  . Sexual activity: Never  Other Topics Concern  . Not on file  Social History Narrative  . Not on file   Social Determinants of Health   Financial Resource Strain:   . Difficulty of Paying Living Expenses:   Food Insecurity:   . Worried About Programme researcher, broadcasting/film/video in the Last Year:   . Barista in the Last Year:   Transportation Needs:   . Freight forwarder (Medical):   Marland Kitchen Lack of Transportation (Non-Medical):   Physical Activity:   . Days of Exercise per Week:   . Minutes  of Exercise per Session:   Stress:   . Feeling of Stress :   Social Connections:   . Frequency of Communication with Friends and Family:   . Frequency of Social Gatherings with Friends and Family:   . Attends Religious Services:   . Active Member of Clubs or Organizations:   . Attends Archivist Meetings:   Marland Kitchen Marital Status:    Additional Social History:                         Sleep: Fair  Appetite:  Fair  Current Medications: Current Facility-Administered Medications  Medication Dose Route Frequency Provider Last Rate Last Admin  . acetaminophen (TYLENOL) tablet 650 mg  650 mg Oral Q6H PRN  Money, Lowry Ram, FNP      . alum & mag hydroxide-simeth (MAALOX/MYLANTA) 200-200-20 MG/5ML suspension 30 mL  30 mL Oral Q4H PRN Money, Darnelle Maffucci B, FNP      . divalproex (DEPAKOTE) DR tablet 750 mg  750 mg Oral QPM Larita Fife, MD   750 mg at 11/11/19 1708  . hydrOXYzine (ATARAX/VISTARIL) tablet 25 mg  25 mg Oral TID PRN Money, Lowry Ram, FNP      . magnesium hydroxide (MILK OF MAGNESIA) suspension 30 mL  30 mL Oral Daily PRN Money, Lowry Ram, FNP      . QUEtiapine (SEROQUEL) tablet 300 mg  300 mg Oral QHS Money, Lowry Ram, FNP   300 mg at 11/11/19 2119  . traZODone (DESYREL) tablet 100 mg  100 mg Oral QHS PRN Money, Lowry Ram, FNP   100 mg at 11/11/19 2119    Lab Results:  Results for orders placed or performed during the hospital encounter of 11/10/19 (from the past 48 hour(s))  Valproic acid level     Status: Abnormal   Collection Time: 11/10/19  1:09 PM  Result Value Ref Range   Valproic Acid Lvl <10 (L) 50.0 - 100.0 ug/mL    Comment: Performed at [facility], [address] [place], [place] [address]  TSH     Status: None   Collection Time: 11/11/19  6:46 AM  Result Value Ref Range   TSH 1.155 0.350 - 4.500 uIU/mL    Comment: Performed by a 3rd Generation assay with a functional sensitivity of <=0.01 uIU/mL. Performed at [facility], [address] [place], [place] [address]   Lipid panel     Status: Abnormal   Collection Time: 11/11/19  6:46 AM  Result Value Ref Range   Cholesterol 163 0 - 200 mg/dL   Triglycerides 81 <150 mg/dL   HDL 39 (L) >40 mg/dL   Total CHOL/HDL Ratio 4.2 RATIO   VLDL 16 0 - 40 mg/dL   LDL Cholesterol 108 (H) 0 - 99 mg/dL    Comment:        Total Cholesterol/HDL:CHD Risk Coronary Heart Disease Risk Table                     Men   Women  1/2 Average Risk   3.4   3.3  Average Risk       5.0   4.4  2 X Average Risk   9.6   7.1  3 X Average Risk  23.4   11.0        Use the calculated Patient Ratio above and the CHD Risk Table to  determine the patient's CHD Risk.  ATP III CLASSIFICATION (LDL):  <100     mg/dL   Optimal  732-202  mg/dL   Near or Above                    Optimal  130-159  mg/dL   Borderline  542-706  mg/dL   High  >237     mg/dL   Very High Performed at [facility], [address] [place], [place] [address]   Valproic acid level     Status: None   Collection Time: 11/12/19  7:04 AM  Result Value Ref Range   Valproic Acid Lvl 56 50.0 - 100.0 ug/mL    Comment: Performed at [facility], [address] [place], [place] [address]    Blood Alcohol level:  Lab Results  Component Value Date   [facility] <10 11/09/2019   ETH <5 08/11/2016    Metabolic Disorder Labs: Lab Results  Component Value Date   HGBA1C 5.4 08/15/2016   MPG 108 08/15/2016   MPG 123 11/26/2015   Lab Results  Component Value Date   PROLACTIN 33.2 (H) 08/15/2016   Lab Results  Component Value Date   CHOL 163 11/11/2019   TRIG 81 11/11/2019   HDL 39 (L) 11/11/2019   CHOLHDL 4.2 11/11/2019   VLDL 16 11/11/2019   LDLCALC 108 (H) 11/11/2019   LDLCALC 95 08/15/2016    Physical Findings: AIMS: Facial and Oral Movements Muscles of Facial Expression: None, normal Lips and Perioral Area: None, normal Jaw: None, normal Tongue: None, normal,Extremity Movements Upper (arms, wrists, hands, fingers): None, normal Lower (legs, knees, ankles, toes): None, normal, Trunk Movements Neck, shoulders, hips: None, normal, Overall Severity Severity of abnormal movements (highest score from questions above): None, normal Incapacitation due to abnormal movements: None, normal Patient's awareness of abnormal movements (rate only patient's report): No Awareness, Dental Status Current problems with teeth and/or dentures?: No Does patient usually wear dentures?: No  CIWA:  CIWA-Ar Total: 3 COWS:  COWS Total Score: 0  Musculoskeletal: Strength & Muscle Tone: within normal limits Gait & Station: normal Patient  leans: N/A  Psychiatric Specialty Exam: Physical Exam  Nursing note and vitals reviewed. Constitutional: He is oriented to person, place, and time. He appears well-developed and well-nourished.  HENT:  Head: Normocephalic and atraumatic.  Respiratory: Effort normal.  Neurological: He is alert and oriented to person, place, and time.    Review of Systems  Blood pressure (!) 107/57, pulse 68, temperature 98.6 F (37 C), temperature source Oral, resp. rate 18, height 6\' 1"  (1.854 m), weight 97.5 kg, SpO2 93 %.Body mass index is 28.37 kg/m.  General Appearance: Disheveled  Eye Contact:  Minimal  Speech:  Normal Rate  Volume:  Decreased  Mood:  Anxious, Depressed, Dysphoric and Irritable  Affect:  Flat  Thought Process:  Goal Directed and Descriptions of Associations: Circumstantial  Orientation:  Full (Time, Place, and Person)  Thought Content:  Hallucinations: Auditory  Suicidal Thoughts:  No  Homicidal Thoughts:  No  Memory:  Immediate;   Fair Recent;   Fair Remote;   Fair  Judgement:  Impaired  Insight:  Fair  Psychomotor Activity:  Decreased  Concentration:  Concentration: Poor and Attention Span: Poor  Recall:  Poor  Fund of Knowledge:  Fair  Language:  Fair  Akathisia:  Negative  Handed:  Right  AIMS (if indicated):     Assets:  Desire for Improvement Housing Resilience  ADL's:  Intact  Cognition:  WNL  Sleep:  Number of Hours: 7.75     Treatment Plan Summary: Daily contact with patient to assess and evaluate symptoms and progress in treatment, Medication management and Plan : Patient is seen and examined.  Patient is a 24 year old male with the above-stated past psychiatric history who is seen in follow-up.   Diagnosis: #1 schizoaffective disorder; depressive type, #2 cannabis use disorder, #3 alcohol use disorder  Patient is seen in follow-up.  From review of the chart he looks essentially unchanged.  His vital signs are stable and he is sleeping relatively  well.  He denied any suicidal or homicidal ideation, but he does continue to have auditory hallucinations.  Chart review revealed his last admission within our system on 08/12/2016.  At that time he was prescribed Wellbutrin, Depakote, hydroxyzine, Seroquel and trazodone.  An emergency room note from 10/15/2019 at lease showed that he was still taking Wellbutrin.  The admission note shows that he had not been taking medications for approximately 2 months.  I am going to restart the Wellbutrin, but we will start with the sustained-release versus the XL.  We will started 100 mg p.o. daily and titrate that during the course the hospitalization.  No other changes in his medicines at least at this point.  1.  Continue Depakote DR 750 mg p.o. every afternoon for mood stability. 2.  Continue hydroxyzine 25 mg p.o. 3 times daily as needed anxiety. 3.  Continue Seroquel 300 mg p.o. nightly for psychosis and mood stability. 4.  Continue trazodone 100 mg p.o. nightly as needed insomnia. 5.  Start Wellbutrin SR 100 mg p.o. daily and titrate cautiously.  This is for depression. 6.  Monitor for withdrawal symptoms from alcohol. 7.  Disposition planning-in progress.  [name], MD 11/12/2019, 10:23 AM

## 2019-11-12 NOTE — Progress Notes (Signed)
Recreation Therapy Notes  INPATIENT RECREATION THERAPY ASSESSMENT  Patient Details Name: Timothy [name] MRN: [id number] DOB: 1996-01-20 Today's Date: 11/12/2019       Information Obtained From: Patient  Able to Participate in Assessment/Interview: Yes  Patient Presentation: Responsive  Reason for Admission (Per Patient): Active Symptoms  Patient Stressors:    Coping Skills:   Sports, Exercise, Art  Leisure Interests (2+):  Individual - Reading, Sports - Basketball  Frequency of Recreation/Participation: Weekly  Awareness of Community Resources:     [employer]:     Current Use:    If no, Barriers?:    Expressed Interest in [employer] Information:    [employer] of Residence:  Film/video editor  Patient Main Form of Transportation: Therapist, music  Patient Strengths:  Faith  Patient Identified Areas of Improvement:  My medication  Patient Goal for Hospitalization:  Get back on medication  Current SI (including self-harm):  No  Current HI:  No  Current AVH: No  Staff Intervention Plan: Group Attendance, Collaborate with Interdisciplinary Treatment Team  Consent to Intern Participation: N/A  Dianne Whelchel 11/12/2019, 4:04 PM

## 2019-11-13 MED ORDER — BUPROPION HCL ER (SR) 100 MG PO TB12
100.0000 mg | ORAL_TABLET | Freq: Two times a day (BID) | ORAL | Status: DC
  Administered 2019-11-13 – 2019-11-14 (×2): 100 mg via ORAL
  Filled 2019-11-13 (×3): qty 1

## 2019-11-13 NOTE — Progress Notes (Signed)
Care of patient taken over at 11pm, pt asleep, eyes closed, resting in no distress. Pt remained asleep for remainder of shift. Remains safe on unit with q 15 min checks.

## 2019-11-13 NOTE — Plan of Care (Signed)
  Problem: Safety: Goal: Periods of time without injury will increase Outcome: Progressing   

## 2019-11-13 NOTE — Progress Notes (Signed)
[facility] MD Progress Note  11/13/2019 10:15 AM Timothy [name]  MRN:  [id number] Subjective:  Patient is a 24 year old male with a reported past psychiatric history significant for schizoaffective disorder who presented to the [facility] on 11/10/2019 with psychotic symptoms, suicidal and homicidal ideation.  He had been noncompliant with his medications.  Objective: Patient is seen and examined.  Patient is a 24 year old male with the above-stated past psychiatric history is seen in follow-up.  He is communicating a bit better today.  He still remains isolated and withdrawn.  At least today he is not asking to be discharged immediately.  He stated his mood was a bit better, and stated that the auditory hallucinations have decreased to a degree.  He denied any suicidal ideation this morning.  We discussed the fact that his Wellbutrin SR had been restarted yesterday.  I spoke with his mother last p.m., and we discussed the case.  His vital signs are stable, he is afebrile.  He slept 9 hours last night.  His Depakote level from 11/12/2019 was 56.  TSH was 1.155.  Principal Problem: <principal problem not specified> Diagnosis: Active Problems:   Schizoaffective disorder (HCC)  Total Time spent with patient: 20 minutes  Past Psychiatric History: See admission H&P  Past Medical History:  Past Medical History:  Diagnosis Date  . Cannabis abuse   . Paranoid schizophrenia ([place])   . Schizoaffective disorder, depressive type ([place]) 09/17/2014   History reviewed. No pertinent surgical history. Family History:  Family History  Problem Relation Age of Onset  . Mental illness Brother   . Drug abuse Brother   . Mental illness Cousin   . Suicidality Cousin   . Diabetes Maternal Grandmother    Family Psychiatric  History: See admission H&P Social History:  Social History   Substance and Sexual Activity  Alcohol Use Yes  . Alcohol/week: 1.0 standard drinks  . Types: 1 Shots of liquor  per week     Social History   Substance and Sexual Activity  Drug Use Yes  . Types: Marijuana    Social History   Socioeconomic History  . Marital status: Single    Spouse name: Not on file  . Number of children: Not on file  . Years of education: Not on file  . Highest education level: Not on file  Occupational History  . Not on file  Tobacco Use  . Smoking status: Current Some Day Smoker    Packs/day: 0.25  . Smokeless tobacco: Never Used  Substance and Sexual Activity  . Alcohol use: Yes    Alcohol/week: 1.0 standard drinks    Types: 1 Shots of liquor per week  . Drug use: Yes    Types: Marijuana  . Sexual activity: Never  Other Topics Concern  . Not on file  Social History Narrative  . Not on file   Social Determinants of Health   Financial Resource Strain:   . Difficulty of Paying Living Expenses:   Food Insecurity:   . Worried About Charity fundraiser in the Last Year:   . Arboriculturist in the Last Year:   Transportation Needs:   . Film/video editor (Medical):   Marland Kitchen Lack of Transportation (Non-Medical):   Physical Activity:   . Days of Exercise per Week:   . Minutes of Exercise per Session:   Stress:   . Feeling of Stress :   Social Connections:   . Frequency of Communication with Friends  and Family:   . Frequency of Social Gatherings with Friends and Family:   . Attends Religious Services:   . Active Member of Clubs or Organizations:   . Attends Banker Meetings:   Marland Kitchen Marital Status:    Additional Social History:                         Sleep: Good  Appetite:  Fair  Current Medications: Current Facility-Administered Medications  Medication Dose Route Frequency Provider Last Rate Last Admin  . acetaminophen (TYLENOL) tablet 650 mg  650 mg Oral Q6H PRN Money, Gerlene Burdock, FNP      . alum & mag hydroxide-simeth (MAALOX/MYLANTA) 200-200-20 MG/5ML suspension 30 mL  30 mL Oral Q4H PRN Money, Gerlene Burdock, FNP      . buPROPion  [facility] SR) 12 hr tablet 100 mg  100 mg Oral Daily [name], MD   100 mg at 11/13/19 0803  . divalproex (DEPAKOTE) DR tablet 750 mg  750 mg Oral QPM Paliy, Serina Cowper, MD   750 mg at 11/12/19 1726  . hydrOXYzine (ATARAX/VISTARIL) tablet 25 mg  25 mg Oral TID PRN Money, Gerlene Burdock, FNP      . magnesium hydroxide (MILK OF MAGNESIA) suspension 30 mL  30 mL Oral Daily PRN Money, Gerlene Burdock, FNP      . QUEtiapine (SEROQUEL) tablet 300 mg  300 mg Oral QHS Money, Gerlene Burdock, FNP   300 mg at 11/12/19 2122  . traZODone (DESYREL) tablet 100 mg  100 mg Oral QHS PRN Money, Gerlene Burdock, FNP   100 mg at 11/11/19 2119    Lab Results:  Results for orders placed or performed during the hospital encounter of 11/10/19 (from the past 48 hour(s))  Valproic acid level     Status: None   Collection Time: 11/12/19  7:04 AM  Result Value Ref Range   Valproic Acid Lvl 56 50.0 - 100.0 ug/mL    Comment: Performed at [facility], [address] [place], [place] [address]    Blood Alcohol level:  Lab Results  Component Value Date   [facility] <10 11/09/2019   ETH <5 08/11/2016    Metabolic Disorder Labs: Lab Results  Component Value Date   HGBA1C 5.4 08/15/2016   MPG 108 08/15/2016   MPG 123 11/26/2015   Lab Results  Component Value Date   PROLACTIN 33.2 (H) 08/15/2016   Lab Results  Component Value Date   CHOL 163 11/11/2019   TRIG 81 11/11/2019   HDL 39 (L) 11/11/2019   CHOLHDL 4.2 11/11/2019   VLDL 16 11/11/2019   LDLCALC 108 (H) 11/11/2019   LDLCALC 95 08/15/2016    Physical Findings: AIMS: Facial and Oral Movements Muscles of Facial Expression: None, normal Lips and Perioral Area: None, normal Jaw: None, normal Tongue: None, normal,Extremity Movements Upper (arms, wrists, hands, fingers): None, normal Lower (legs, knees, ankles, toes): None, normal, Trunk Movements Neck, shoulders, hips: None, normal, Overall Severity Severity of abnormal movements (highest score from questions  above): None, normal Incapacitation due to abnormal movements: None, normal Patient's awareness of abnormal movements (rate only patient's report): No Awareness, Dental Status Current problems with teeth and/or dentures?: No Does patient usually wear dentures?: No  CIWA:  CIWA-Ar Total: 3 COWS:  COWS Total Score: 0  Musculoskeletal: Strength & Muscle Tone: within normal limits Gait & Station: normal Patient leans: N/A  Psychiatric Specialty Exam: Physical Exam  Nursing note and vitals reviewed.  Constitutional: He is oriented to person, place, and time. He appears well-developed and well-nourished.  HENT:  Head: Normocephalic and atraumatic.  Respiratory: Effort normal.  Neurological: He is alert and oriented to person, place, and time.    Review of Systems  Blood pressure 121/67, pulse 63, temperature 98.5 F (36.9 C), temperature source Oral, resp. rate 18, height 6\' 1"  (1.854 m), weight 97.5 kg, SpO2 100 %.Body mass index is 28.37 kg/m.  General Appearance: Casual  Eye Contact:  Fair  Speech:  Normal Rate  Volume:  Decreased  Mood:  Depressed and Dysphoric  Affect:  Congruent  Thought Process:  Coherent and Descriptions of Associations: Circumstantial  Orientation:  Full (Time, Place, and Person)  Thought Content:  Hallucinations: Auditory  Suicidal Thoughts:  No  Homicidal Thoughts:  No  Memory:  Immediate;   Fair Recent;   Fair Remote;   Fair  Judgement:  Intact  Insight:  Fair  Psychomotor Activity:  Psychomotor Retardation  Concentration:  Concentration: Fair and Attention Span: Fair  Recall:  of Knowledge:  Fair  Language:  Good  Akathisia:  Negative  Handed:  Right  AIMS (if indicated):     Assets:  Desire for Improvement Housing Resilience Social Support  ADL's:  Intact  Cognition:  WNL  Sleep:  Number of Hours: 9     Treatment Plan Summary: Daily contact with patient to assess and evaluate symptoms and progress in treatment, Medication  management and Plan : Patient is seen and examined.  Patient is a 24 year old male with the above-stated past psychiatric history who is seen in follow-up.   Diagnosis: #1 schizoaffective disorder; depressive type, #2 cannabis use disorder, #3 alcohol use disorder  Patient is seen in follow-up.  He is perhaps slightly better.  I am going to go on and increase his Wellbutrin SR to 100 mg p.o. twice daily.  We will monitor whether or not this causes a disruption in his sleep.  No change in his other medications at this time.  No evidence of any alcohol withdrawal.  If he continues to have any auditory hallucinations we will consider increasing his Seroquel dosage.  1.  Continue Depakote DR 750 mg p.o. every afternoon for mood stability. 2.  Continue hydroxyzine 25 mg p.o. 3 times daily as needed anxiety. 3.  Continue Seroquel 300 mg p.o. nightly for psychosis and mood stability. 4.  Continue trazodone 100 mg p.o. nightly as needed insomnia. 5.  Increase Wellbutrin SR to 100 mg p.o. twice daily and titrate cautiously.  This is for depression. 6.  Monitor for withdrawal symptoms from alcohol. 7.  Disposition planning-in progress.  22, MD 11/13/2019, 10:15 AM

## 2019-11-13 NOTE — Progress Notes (Signed)
Recreation Therapy Notes   Date: 11/13/2019  Time: 9:30 am  Location: Craft room   Behavioral response: Appropriate   Intervention Topic: Happiness     Discussion/Intervention:  Group content today was focused on Happiness. The group defined happiness and described where happiness comes from. Individuals identified what makes them happy and how they go about making others happy. Patients expressed things that stop them from being happy and ways they can improve their happiness. The group stated reasons why it is important to be happy. The group participated in the intervention "My Happiness", where they had a chance to identify and express things that make them happy. Clinical Observations/Feedback:  Patient came to group and defined happiness as going somewhere quiet like at a lake or just in nature. He expressed that being surrounded by cool people makes him happy. Participant explained that happiness is important to keep from being sad and depressed. Individual was social with peers and staff while participating in the intervention.   Timothy [name] LRT/CTRS         Timothy [name] 11/13/2019 11:47 AM

## 2019-11-13 NOTE — Progress Notes (Signed)
   11/13/19 1400  Clinical Encounter Type  Visited With Patient;Other (Comment)  Visit Type Initial;Spiritual support;Behavioral Health  Referral From Chaplain  Consult/Referral To Chaplain  Patient participated in group. Although he was quiet most of the time, Glynn was actively involved in the group on ANGER. During the discussion the group talked about journaling and chaplain asked if he had a journal and he said no. After the group was over, chaplain brought [facility] a journal.

## 2019-11-13 NOTE — [facility] Group Notes (Signed)
Feelings Around Relapse 11/13/2019 1PM  Type of Therapy and Topic:  Group Therapy:  Feelings around Relapse and Recovery  Participation Level:  Active   Description of Group:    Patients in this group will discuss emotions they experience before and after a relapse. They will process how experiencing these feelings, or avoidance of experiencing them, relates to having a relapse. Facilitator will guide patients to explore emotions they have related to recovery. Patients will be encouraged to process which emotions are more powerful. They will be guided to discuss the emotional reaction significant others in their lives may have to patients' relapse or recovery. Patients will be assisted in exploring ways to respond to the emotions of others without this contributing to a relapse.  Therapeutic Goals: 1. Patient will identify two or more emotions that lead to a relapse for them 2. Patient will identify two emotions that result when they relapse 3. Patient will identify two emotions related to recovery 4. Patient will demonstrate ability to communicate their needs through discussion and/or role plays   Summary of Patient Progress: Actively and appropriately participated in group session. Pt completed relapse prevention plan. Pt discussed with group members strategies he plans to use to avoid relapse. Pt demonstrated understanding of group topic and respected boundaries.    Therapeutic Modalities:   Cognitive Behavioral Therapy Solution-Focused Therapy Assertiveness Training Relapse Prevention Therapy   Suzan Slick, LCSW 11/13/2019 2:27 PM

## 2019-11-13 NOTE — Plan of Care (Addendum)
Pt rates depression 8/10, hopelessness 10/10 and anxiety 7/10. Pt denies HI and AVH. Pt has passive SI but no plan and contracts for safety. Pt was educated on care plan and verbalizes understanding. Pt was encouraged to attend groups. Torrie Mayers RN Problem: Education: Goal: Knowledge of [facility] Education information/materials will improve 11/13/2019 1843 by Chalmers Cater, RN Outcome: Progressing 11/13/2019 1135 by Chalmers Cater, RN Outcome: Progressing Goal: Emotional status will improve 11/13/2019 1843 by Chalmers Cater, RN Outcome: Progressing 11/13/2019 1135 by Chalmers Cater, RN Outcome: Progressing Goal: Mental status will improve 11/13/2019 1843 by Chalmers Cater, RN Outcome: Progressing 11/13/2019 1135 by Chalmers Cater, RN Outcome: Progressing Goal: Verbalization of understanding the information provided will improve 11/13/2019 1843 by Chalmers Cater, RN Outcome: Progressing 11/13/2019 1135 by Chalmers Cater, RN Outcome: Progressing   Problem: Safety: Goal: Periods of time without injury will increase 11/13/2019 1843 by Chalmers Cater, RN Outcome: Progressing 11/13/2019 1135 by Chalmers Cater, RN Outcome: Progressing   Problem: Self-Concept: Goal: Ability to disclose and discuss suicidal ideas will improve 11/13/2019 1843 by Chalmers Cater, RN Outcome: Progressing 11/13/2019 1135 by Chalmers Cater, RN Outcome: Progressing Goal: Will verbalize positive feelings about self 11/13/2019 1843 by Chalmers Cater, RN Outcome: Progressing 11/13/2019 1135 by Chalmers Cater, RN Outcome: Progressing

## 2019-11-13 NOTE — Progress Notes (Signed)
Pt attended groups all day and went outside. He has been calm and cooperative all day. Torrie Mayers RN

## 2019-11-14 DIAGNOSIS — I499 Cardiac arrhythmia, unspecified: Secondary | ICD-10-CM

## 2019-11-14 MED ORDER — ZIPRASIDONE MESYLATE 20 MG IM SOLR: 20.0000 mg | INTRAMUSCULAR | Status: DC | PRN

## 2019-11-14 MED ORDER — LORAZEPAM 1 MG PO TABS: 1.0000 mg | ORAL_TABLET | ORAL | Status: DC | PRN

## 2019-11-14 MED ORDER — BUPROPION HCL ER (SR) 150 MG PO TB12
150.0000 mg | ORAL_TABLET | Freq: Two times a day (BID) | ORAL | Status: DC
  Administered 2019-11-14 – 2019-11-16 (×4): 150 mg via ORAL
  Filled 2019-11-14 (×5): qty 1

## 2019-11-14 MED ORDER — RISPERIDONE 1 MG PO TBDP: 2.0000 mg | ORAL_TABLET | Freq: Three times a day (TID) | ORAL | Status: DC | PRN

## 2019-11-14 NOTE — [facility] Group Notes (Addendum)
[facility] Group Notes:  (Nursing/MHT/Case Management/Adjunct)  Date:  11/14/2019  Time:  10:30 am  Type of Therapy:  Psychoeducational Skills  Jorie Zee A Lilyth Lawyer 11/14/2019, 3:48 PM

## 2019-11-14 NOTE — BH Assessment (Signed)
[facility] Group Notes:  (Nursing/MHT/Case Management/Adjunct)  Date:  11/14/2019  Time:  10:01 PM  Type of Therapy:  Group Therapy  Participation Level:  Active  Participation Quality:  Appropriate  Affect:  Appropriate  Cognitive:  Alert  Insight:  Good  Engagement in Group:  Engaged  Modes of Intervention:  Support  Summary of Progress/Problems:  Timothy [name] 11/14/2019, 10:01 PM

## 2019-11-14 NOTE — Progress Notes (Signed)
Recreation Therapy Notes  Date: 11/14/2019  Time: 9:30 am   Location: Craft room    Behavioral response: N/A   Intervention Topic: Stress Management   Discussion/Intervention: Patient did not attend group.   Clinical Observations/Feedback:  Patient did not attend group.   Wyoma Genson LRT/CTRS         Selvin Yun 11/14/2019 10:41 AM

## 2019-11-14 NOTE — Progress Notes (Signed)
[facility] MD Progress Note  11/14/2019 11:24 AM Timothy [name]  MRN:  [id number] Subjective:  Patient is a 24 year old male with a reported past psychiatric history significant for schizoaffective disorder who presented to the [facility] on 11/10/2019 with psychotic symptoms, suicidal and homicidal ideation. He had been noncompliant with his medications.  Objective: Patient is seen and examined.  Patient is a 24 year old male with the above-stated past psychiatric history is seen in follow-up.  Unfortunately he is told staff that he was planning on hanging himself or killing himself if he was not discharged today.  We discussed that he is irritable about that.  I attempted to explain how it takes time for the Wellbutrin to get into his system.  He was otherwise uncooperative with the examination today.  His vital signs are stable, he is afebrile.  He slept 7.4 hours last night.  No new laboratories.  EKG is still not in the chart.  Principal Problem: <principal problem not specified> Diagnosis: Active Problems:   Schizoaffective disorder (HCC)  Total Time spent with patient: 15 minutes  Past Psychiatric History: See admission H&P  Past Medical History:  Past Medical History:  Diagnosis Date  . Cannabis abuse   . Paranoid schizophrenia (HCC)   . Schizoaffective disorder, depressive type (HCC) 09/17/2014   History reviewed. No pertinent surgical history. Family History:  Family History  Problem Relation Age of Onset  . Mental illness Brother   . Drug abuse Brother   . Mental illness Cousin   . Suicidality Cousin   . Diabetes Maternal Grandmother    Family Psychiatric  History: See admission H&P Social History:  Social History   Substance and Sexual Activity  Alcohol Use Yes  . Alcohol/week: 1.0 standard drinks  . Types: 1 Shots of liquor per week     Social History   Substance and Sexual Activity  Drug Use Yes  . Types: Marijuana    Social History    Socioeconomic History  . Marital status: Single    Spouse name: Not on file  . Number of children: Not on file  . Years of education: Not on file  . Highest education level: Not on file  Occupational History  . Not on file  Tobacco Use  . Smoking status: Current Some Day Smoker    Packs/day: 0.25  . Smokeless tobacco: Never Used  Substance and Sexual Activity  . Alcohol use: Yes    Alcohol/week: 1.0 standard drinks    Types: 1 Shots of liquor per week  . Drug use: Yes    Types: Marijuana  . Sexual activity: Never  Other Topics Concern  . Not on file  Social History Narrative  . Not on file   Social Determinants of Health   Financial Resource Strain:   . Difficulty of Paying Living Expenses:   Food Insecurity:   . Worried About Programme researcher, broadcasting/film/video in the Last Year:   . Barista in the Last Year:   Transportation Needs:   . Freight forwarder (Medical):   Marland Kitchen Lack of Transportation (Non-Medical):   Physical Activity:   . Days of Exercise per Week:   . Minutes of Exercise per Session:   Stress:   . Feeling of Stress :   Social Connections:   . Frequency of Communication with Friends and Family:   . Frequency of Social Gatherings with Friends and Family:   . Attends Religious Services:   . Active Member of  Clubs or Organizations:   . Attends Archivist Meetings:   Marland Kitchen Marital Status:    Additional Social History:                         Sleep: Good  Appetite:  Good  Current Medications: Current Facility-Administered Medications  Medication Dose Route Frequency Provider Last Rate Last Admin  . acetaminophen (TYLENOL) tablet 650 mg  650 mg Oral Q6H PRN Money, Lowry Ram, FNP      . alum & mag hydroxide-simeth (MAALOX/MYLANTA) 200-200-20 MG/5ML suspension 30 mL  30 mL Oral Q4H PRN Money, Lowry Ram, FNP      . buPROPion [facility] SR) 12 hr tablet 100 mg  100 mg Oral BID Sharma Covert, MD   100 mg at 11/14/19 0820  . divalproex  (DEPAKOTE) DR tablet 750 mg  750 mg Oral QPM Larita Fife, MD   750 mg at 11/13/19 1708  . hydrOXYzine (ATARAX/VISTARIL) tablet 25 mg  25 mg Oral TID PRN Money, Lowry Ram, FNP      . magnesium hydroxide (MILK OF MAGNESIA) suspension 30 mL  30 mL Oral Daily PRN Money, Lowry Ram, FNP      . QUEtiapine (SEROQUEL) tablet 300 mg  300 mg Oral QHS Money, Lowry Ram, FNP   300 mg at 11/13/19 2131  . traZODone (DESYREL) tablet 100 mg  100 mg Oral QHS PRN Money, Lowry Ram, FNP   100 mg at 11/11/19 2119    Lab Results: No results found for this or any previous visit (from the past 48 hour(s)).  Blood Alcohol level:  Lab Results  Component Value Date   ETH <10 11/09/2019   ETH <5 33/29/5188    Metabolic Disorder Labs: Lab Results  Component Value Date   HGBA1C 5.4 08/15/2016   MPG 108 08/15/2016   MPG 123 11/26/2015   Lab Results  Component Value Date   PROLACTIN 33.2 (H) 08/15/2016   Lab Results  Component Value Date   CHOL 163 11/11/2019   TRIG 81 11/11/2019   HDL 39 (L) 11/11/2019   CHOLHDL 4.2 11/11/2019   VLDL 16 11/11/2019   LDLCALC 108 (H) 11/11/2019   LDLCALC 95 08/15/2016    Physical Findings: AIMS: Facial and Oral Movements Muscles of Facial Expression: None, normal Lips and Perioral Area: None, normal Jaw: None, normal Tongue: None, normal,Extremity Movements Upper (arms, wrists, hands, fingers): None, normal Lower (legs, knees, ankles, toes): None, normal, Trunk Movements Neck, shoulders, hips: None, normal, Overall Severity Severity of abnormal movements (highest score from questions above): None, normal Incapacitation due to abnormal movements: None, normal Patient's awareness of abnormal movements (rate only patient's report): No Awareness, Dental Status Current problems with teeth and/or dentures?: No Does patient usually wear dentures?: No  CIWA:  CIWA-Ar Total: 3 COWS:  COWS Total Score: 0  Musculoskeletal: Strength & Muscle Tone: within normal limits Gait &  Station: normal Patient leans: N/A  Psychiatric Specialty Exam: Physical Exam  Nursing note and vitals reviewed. Constitutional: He is oriented to person, place, and time. He appears well-developed and well-nourished.  HENT:  Head: Normocephalic and atraumatic.  Respiratory: Effort normal.  Neurological: He is oriented to person, place, and time.    Review of Systems  Blood pressure (!) 103/53, pulse 76, temperature 98.6 F (37 C), temperature source Oral, resp. rate 17, height 6\' 1"  (1.854 m), weight 97.5 kg, SpO2 100 %.Body mass index is 28.37 kg/m.  General Appearance: Disheveled  Eye Contact:  Minimal  Speech:  Normal Rate  Volume:  Increased  Mood:  Irritable  Affect:  Congruent  Thought Process:  Coherent and Descriptions of Associations: Circumstantial  Orientation:  Full (Time, Place, and Person)  Thought Content:  Rumination  Suicidal Thoughts:  Yes.  without intent/plan  Homicidal Thoughts:  No  Memory:  Immediate;   Fair Recent;   Fair Remote;   Fair  Judgement:  Impaired  Insight:  Lacking  Psychomotor Activity:  Increased  Concentration:  Concentration: Fair and Attention Span: Fair  Recall:  [employer] of Knowledge:  Fair  Language:  Fair  Akathisia:  Negative  Handed:  Right  AIMS (if indicated):     Assets:  Desire for Improvement Resilience  ADL's:  Intact  Cognition:  WNL  Sleep:  Number of Hours: 7.45     Treatment Plan Summary: Daily contact with patient to assess and evaluate symptoms and progress in treatment, Medication management and Plan : Patient is seen and examined.  Patient is a 24 year old male with the above-stated past psychiatric history who is seen in follow-up.   Diagnosis: #1 schizoaffective disorder; depressive type, #2 cannabis use disorder, #3 alcohol use disorder  Patient is seen and examined.  He is essentially unchanged.  He is more irritable today after I told him he would not be able to be discharged.  I am going to go  on and increase his Wellbutrin SR 250 mg p.o. twice daily.  It does not appear as though the Wellbutrin has caused this increase in irritability, because he was basically isolating in his room laying in the dark prior to our discussion.  Hopefully his mood will improve and his insight will improve as his medications are getting in his system.  No other changes to his medications besides Wellbutrin.  We will attempt to get an EKG today, but it may not be able to be obtained.  1. Continue Depakote DR 750 mg p.o. every afternoon for mood stability. 2. Continue hydroxyzine 25 mg p.o. 3 times daily as needed anxiety. 3. Continue Seroquel 300 mg p.o. nightly for psychosis and mood stability. 4. Continue trazodone 100 mg p.o. nightly as needed insomnia. 5.  Increase Wellbutrin SR to 150 mg p.o. twice daily and titrate cautiously. This is for depression. 6. Monitor for withdrawal symptoms from alcohol. 7. Disposition planning-in progress.  [name], MD 11/14/2019, 11:24 AM

## 2019-11-14 NOTE — Plan of Care (Signed)
Pt rates depression 3/10, hopelessness 6/10 and anxiety 2/10. Pt denies HI. Pt has AVH. Pt has passive SI and a plan to hang himself in the unit if he does not dc today. MD and staff notified. Will continue to monitor and keep safe. Pt encouraged to attend groups. Torrie Mayers RN Problem: Education: Goal: Charity fundraiser Education information/materials will improve Outcome: Progressing Goal: Emotional status will improve Outcome: Progressing Goal: Mental status will improve Outcome: Not Progressing Goal: Verbalization of understanding the information provided will improve Outcome: Progressing   Problem: Safety: Goal: Periods of time without injury will increase Outcome: Progressing   Problem: Self-Concept: Goal: Ability to disclose and discuss suicidal ideas will improve Outcome: Progressing Goal: Will verbalize positive feelings about self Outcome: Progressing

## 2019-11-14 NOTE — BHH Group Notes (Signed)
LCSW Group Therapy Note  11/14/2019 2:08 PM  Type of Therapy/Topic:  Group Therapy:  Emotion Regulation  Participation Level:  Active   Description of Group:   The purpose of this group is to assist patients in learning to regulate negative emotions and experience positive emotions. Patients will be guided to discuss ways in which they have been vulnerable to their negative emotions. These vulnerabilities will be juxtaposed with experiences of positive emotions or situations, and patients will be challenged to use positive emotions to combat negative ones. Special emphasis will be placed on coping with negative emotions in conflict situations, and patients will process healthy conflict resolution skills.  Therapeutic Goals: 1. Patient will identify two positive emotions or experiences to reflect on in order to balance out negative emotions 2. Patient will label two or more emotions that they find the most difficult to experience 3. Patient will demonstrate positive conflict resolution skills through discussion and/or role plays  Summary of Patient Progress: Patient was present in group.  Patient was supportive of other group members. Patient provided others with some insight to address their obstacles and struggles.    Therapeutic Modalities:   Cognitive Behavioral Therapy Feelings Identification Dialectical Behavioral Therapy  Penni Homans, MSW, LCSW 11/14/2019 2:08 PM

## 2019-11-15 NOTE — Progress Notes (Signed)
BRIEF PHARMACY NOTE   This patient attended and participated in Medication Management Group counseling led by [facility] staff pharmacist.  This interactive class reviews basic information about prescription medications and education on personal responsibility in medication management.  The class also includes general knowledge of 3 main classes of behavioral medications, including antipsychotics, antidepressants, and mood stabilizers.     Patient behavior was appropriate for group setting.   Educational materials sourced from:  "Medication Do's and Don'ts" from [email or web address]   "Mental Health Medications" from [facility] FaxRack.tn.shtml#part 103013     Albina Billet ,PharmD 11/15/2019 , 3:08 PM

## 2019-11-15 NOTE — Progress Notes (Signed)
Recreation Therapy Notes   Date: 11/15/2019  Time: 9:30 am  Location: Craft room   Behavioral response: Appropriate   Intervention Topic: Relaxation   Discussion/Intervention:  Group content today was focused on relaxation. The group defined relaxation and identified healthy ways to relax. Individuals expressed how much time they spend relaxing. Patients expressed how much their life would be if they did not make time for themselves to relax. The group stated ways they could improve their relaxation techniques in the future.  Individuals participated in the intervention "Time to Relax" where they had a chance to experience different relaxation techniques.  Clinical Observations/Feedback:  Patient came to group and defined relaxation as peaceful. He stated that he likes to read, watch movies and be in nature to relax. Individual was social with peers and staff while participating in the intervention.   Derril Franek LRT/CTRS         Wadsworth Skolnick 11/15/2019 12:07 PM

## 2019-11-15 NOTE — Progress Notes (Signed)
Patient was pleasant and easy to engage. He denied SI/HI/AVH depression and anxiety during this encounter. He accepted medications and tolerated without incident. Patient active on the unit and engages well with other peers. Patient is safe on the unit with 15 minute safety checks. Patient informed to contact staff with any concerns.

## 2019-11-15 NOTE — Plan of Care (Signed)
  Problem: Education: Goal: Emotional status will improve Outcome: Progressing Goal: Mental status will improve Outcome: Progressing Goal: Verbalization of understanding the information provided will improve Outcome: Progressing   Problem: Safety: Goal: Periods of time without injury will increase Outcome: Progressing   Problem: Self-Concept: Goal: Will verbalize positive feelings about self Outcome: Progressing   Problem: Self-Concept: Goal: Ability to disclose and discuss suicidal ideas will improve Outcome: Not Progressing

## 2019-11-15 NOTE — BHH Group Notes (Signed)
Balance In Life 11/15/2019 1PM  Type of Therapy/Topic:  Group Therapy:  Balance in Life  Participation Level:  Active  Description of Group:   This group will address the concept of balance and how it feels and looks when one is unbalanced. Patients will be encouraged to process areas in their lives that are out of balance and identify reasons for remaining unbalanced. Facilitators will guide patients in utilizing problem-solving interventions to address and correct the stressor making their life unbalanced. Understanding and applying boundaries will be explored and addressed for obtaining and maintaining a balanced life. Patients will be encouraged to explore ways to assertively make their unbalanced needs known to significant others in their lives, using other group members and facilitator for support and feedback.  Therapeutic Goals: 1. Patient will identify two or more emotions or situations they have that consume much of in their lives. 2. Patient will identify signs/triggers that life has become out of balance:  3. Patient will identify two ways to set boundaries in order to achieve balance in their lives:  4. Patient will demonstrate ability to communicate their needs through discussion and/or role plays  Summary of Patient Progress: Actively and appropriately engaged in the group. Patient was able to provide support and validation to other group members.Patient practiced active listening when interacting with the facilitator and other group members. Patient completed group activity and discussed with group members stressors he is working to overcome. Patient was open to feedback from group members and respected boundaries during session.     Therapeutic Modalities:   Cognitive Behavioral Therapy Solution-Focused Therapy Assertiveness Training  Georgetta Crafton Philip Aspen, LCSW

## 2019-11-16 DIAGNOSIS — F251 Schizoaffective disorder, depressive type: Principal | ICD-10-CM

## 2019-11-16 MED ORDER — TRAZODONE HCL 100 MG PO TABS: 100.0000 mg | ORAL_TABLET | Freq: Every evening | ORAL | 0 refills | Status: DC | PRN

## 2019-11-16 MED ORDER — BUPROPION HCL ER (SR) 150 MG PO TB12: 150.0000 mg | ORAL_TABLET | Freq: Two times a day (BID) | ORAL | 0 refills | Status: DC

## 2019-11-16 MED ORDER — QUETIAPINE FUMARATE 300 MG PO TABS: 300.0000 mg | ORAL_TABLET | Freq: Every day | ORAL | 0 refills | Status: DC

## 2019-11-16 MED ORDER — HYDROXYZINE HCL 25 MG PO TABS: 25.0000 mg | ORAL_TABLET | Freq: Three times a day (TID) | ORAL | 0 refills | Status: DC | PRN

## 2019-11-16 MED ORDER — DIVALPROEX SODIUM 250 MG PO DR TAB: 750.0000 mg | DELAYED_RELEASE_TABLET | Freq: Every evening | ORAL | 0 refills | Status: DC

## 2019-11-16 NOTE — Plan of Care (Signed)
  Problem: Group Participation Goal: STG - Patient will engage in groups without prompting or encouragement from LRT x3 group sessions within 5 recreation therapy group sessions Description: STG - Patient will engage in groups without prompting or encouragement from LRT x3 group sessions within 5 recreation therapy group sessions Outcome: Completed/Met

## 2019-11-16 NOTE — Progress Notes (Signed)
Recreation Therapy Notes  INPATIENT RECREATION TR PLAN  Patient Details Name: Timothy [name] MRN: [id number] DOB: 05-16-1996 Today's Date: 11/16/2019  Rec Therapy Plan Is patient appropriate for Therapeutic Recreation?: Yes Treatment times per week: at least 3 Estimated Length of Stay: 5-7 days TR Treatment/Interventions: Group participation (Comment)  Discharge Criteria Pt will be discharged from therapy if:: Discharged Treatment plan/goals/alternatives discussed and agreed upon by:: Patient/family  Discharge Summary Short term goals set: Patient will engage in groups without prompting or encouragement from LRT x3 group sessions within 5 recreation therapy group sessions Short term goals met: Complete Progress toward goals comments: Groups attended Which groups?: Other (Comment)(Relaxation, Happiness, Self-care) Reason goals not met: N/A Therapeutic equipment acquired: N/A Reason patient discharged from therapy: Discharge from hospital Pt/family agrees with progress & goals achieved: Yes Date patient discharged from therapy: 11/16/19   Almeta Geisel 11/16/2019, 3:15 PM

## 2019-11-16 NOTE — Progress Notes (Signed)
  [facility] Adult Case Management Discharge Plan :  Will you be returning to the same living situation after discharge:  Yes,  pt reports that he is returning home.  At discharge, do you have transportation home?: Yes,  pt reports that grandmother will provide transportation. Do you have the ability to pay for your medications: No.  Release of information consent forms completed and in the chart;  Patient's signature needed at discharge.  Patient to Follow up at: Follow-up Information    [facility] Follow up.   Why: Appointment is scheduled for 5/17 @ 8:30 am.  Appointment is virtual. Thanks! Contact information: [address] [place] [address] [phone]           Next level of care provider has access to [facility] Link:no  Safety Planning and Suicide Prevention discussed: Yes,  SPE completed with the patient and the pt's mother.   Have you used any form of tobacco in the last 30 days? (Cigarettes, Smokeless Tobacco, Cigars, and/or Pipes): Yes  Has patient been referred to the Quitline?: Patient refused referral  Patient has been referred for addiction treatment: Pt. refused referral  Harden Mo, LCSW 11/16/2019, 9:19 AM

## 2019-11-16 NOTE — Progress Notes (Signed)
Discharge Note:  The patient was discharged with transportation provided by his grandmother.  Belongings were returned and medications were reviewed.  Arnol denied thoughts of harming himself and others.  No bizarre or unusual behaviors noted.  Scripts were sent to the patient's preferred pharmacy.

## 2019-11-16 NOTE — Progress Notes (Signed)
Care of patient assumed at 2300 and he seemed to rest well through out the night with no issues to report thus far on shift.  

## 2019-11-16 NOTE — BHH Suicide Risk Assessment (Signed)
[facility] Discharge Suicide Risk Assessment   Principal Problem: <principal problem not specified> Discharge Diagnoses: Active Problems:   Schizoaffective disorder (HCC)   Total Time spent with patient: 20 minutes  Musculoskeletal: Strength & Muscle Tone: within normal limits Gait & Station: normal Patient leans: N/A  Psychiatric Specialty Exam: Review of Systems  All other systems reviewed and are negative.   Blood pressure 112/65, pulse 74, temperature 98.3 F (36.8 C), temperature source Oral, resp. rate 18, height 6\' 1"  (1.854 m), weight 97.5 kg, SpO2 100 %.Body mass index is 28.37 kg/m.  General Appearance: Casual  Eye Contact::  Fair  Speech:  Normal Rate409  Volume:  Decreased  Mood:  Anxious  Affect:  Congruent  Thought Process:  Coherent and Descriptions of Associations: Intact  Orientation:  Full (Time, Place, and Person)  Thought Content:  Hallucinations: Auditory  Suicidal Thoughts:  No  Homicidal Thoughts:  No  Memory:  Immediate;   Fair Recent;   Fair Remote;   Fair  Judgement:  Intact  Insight:  Fair  Psychomotor Activity:  Normal  Concentration:  Fair  Recall:  [id number] of Knowledge:Fair  Language: Fair  Akathisia:  Negative  Handed:  Right  AIMS (if indicated):     Assets:  Desire for Improvement Housing Resilience Social Support  Sleep:  Number of Hours: 7.75  Cognition: WNL  ADL's:  Intact   Mental Status Per Nursing Assessment::   On Admission:  NA  Demographic Factors:  Male and Low socioeconomic status  Loss Factors: Financial problems/change in socioeconomic status  Historical Factors: Impulsivity  Risk Reduction Factors:   Employed, Living with another person, especially a relative and Positive social support  Continued Clinical Symptoms:  Alcohol/Substance Abuse/Dependencies Schizophrenia:   Less than 62 years old Paranoid or undifferentiated type  Cognitive Features That Contribute To Risk:  None    Suicide Risk:   Minimal: No identifiable suicidal ideation.  Patients presenting with no risk factors but with morbid ruminations; may be classified as minimal risk based on the severity of the depressive symptoms  Follow-up Information    [facility] Follow up.   Why: Appointment is scheduled for 5/17 @ 8:30 am.  Appointment is virtual. Thanks! Contact information: [address] [place] [phone]           Plan Of Care/Follow-up recommendations:  Activity:  ad lib  [phone], MD 11/16/2019, 9:07 AM

## 2019-11-16 NOTE — Discharge Summary (Signed)
Physician Discharge Summary Note  Patient:  Timothy [name] is an 24 y.o., male MRN:  [id number] DOB:  January 09, 1996 Patient phone:  [phone] (home)  Patient address:   [address] [place] [address],  Total Time spent with patient: 30 minutes  Date of Admission:  11/10/2019 Date of Discharge: 11/16/2019  Reason for Admission: Psychotic, history of schizoaffective disorder  Principal Problem: <principal problem not specified> Discharge Diagnoses: Active Problems:   Schizoaffective disorder [facility])   Past Psychiatric History: See admission H&P  Past Medical History:  Past Medical History:  Diagnosis Date  . Cannabis abuse   . Paranoid schizophrenia (HCC)   . Schizoaffective disorder, depressive type (HCC) 09/17/2014   History reviewed. No pertinent surgical history. Family History:  Family History  Problem Relation Age of Onset  . Mental illness Brother   . Drug abuse Brother   . Mental illness Cousin   . Suicidality Cousin   . Diabetes Maternal Grandmother    Family Psychiatric  History: See admission H&P Social History:  Social History   Substance and Sexual Activity  Alcohol Use Yes  . Alcohol/week: 1.0 standard drinks  . Types: 1 Shots of liquor per week     Social History   Substance and Sexual Activity  Drug Use Yes  . Types: Marijuana    Social History   Socioeconomic History  . Marital status: Single    Spouse name: Not on file  . Number of children: Not on file  . Years of education: Not on file  . Highest education level: Not on file  Occupational History  . Not on file  Tobacco Use  . Smoking status: Current Some Day Smoker    Packs/day: 0.25  . Smokeless tobacco: Never Used  Substance and Sexual Activity  . Alcohol use: Yes    Alcohol/week: 1.0 standard drinks    Types: 1 Shots of liquor per week  . Drug use: Yes    Types: Marijuana  . Sexual activity: Never  Other Topics Concern  . Not on file  Social History Narrative  .  Not on file   Social Determinants of Health   Financial Resource Strain:   . Difficulty of Paying Living Expenses:   Food Insecurity:   . Worried About Programme researcher, broadcasting/film/video in the Last Year:   . Barista in the Last Year:   Transportation Needs:   . Freight forwarder (Medical):   Marland Kitchen Lack of Transportation (Non-Medical):   Physical Activity:   . Days of Exercise per Week:   . Minutes of Exercise per Session:   Stress:   . Feeling of Stress :   Social Connections:   . Frequency of Communication with Friends and Family:   . Frequency of Social Gatherings with Friends and Family:   . Attends Religious Services:   . Active Member of Clubs or Organizations:   . Attends Banker Meetings:   Marland Kitchen Marital Status:     Hospital Course: Patient is a 24 year old male with a past psychiatric history significant for schizoaffective disorder who presented to the [facility] emergency department on 11/10/2019 with psychotic symptoms, suicidal ideation and homicidal ideation.  According to the history it appeared as though the patient had previously been treated with Seroquel, Wellbutrin, Depakote, trazodone and Vistaril.  He had apparently been noncompliant with his medications for the last 2 months.  He had also been smoking marijuana excessively.  He was admitted  to the hospital for evaluation and to restart his medications.  He was restarted on Seroquel 300 mg p.o. nightly, Depakote 750 mg p.o. nightly, trazodone and Vistaril as needed.  I examined the patient for the first time on 11/12/2019.  He remained very isolated and withdrawn.  He admitted to auditory hallucinations.  His Wellbutrin was held on admission, but I restarted it on 11/12/2019.  He had previously been on the XL form of the medication, but because of his history of schizoaffective disorder I decided to use shorter acting Wellbutrin in case he became manic with his medications.  He was initially  started on 100 mg p.o. daily, and this was titrated to 150 mg p.o. twice daily by the time of discharge.  He improved during the course of the hospitalization.  His isolation and avoiding people in groups improved.  He was able to care for himself.  His Depakote level on 11/12/2019 was 56.  Liver function enzymes were normal, CBC was normal.  On 11/16/2019 he was not homicidal, suicidal or psychotic.  It was decided he could be discharged home with outpatient treatment.  Physical Findings: AIMS: Facial and Oral Movements Muscles of Facial Expression: None, normal Lips and Perioral Area: None, normal Jaw: None, normal Tongue: None, normal,Extremity Movements Upper (arms, wrists, hands, fingers): None, normal Lower (legs, knees, ankles, toes): None, normal, Trunk Movements Neck, shoulders, hips: None, normal, Overall Severity Severity of abnormal movements (highest score from questions above): None, normal Incapacitation due to abnormal movements: None, normal Patient's awareness of abnormal movements (rate only patient's report): No Awareness, Dental Status Current problems with teeth and/or dentures?: No Does patient usually wear dentures?: No  CIWA:  CIWA-Ar Total: 3 COWS:  COWS Total Score: 0  Musculoskeletal: Strength & Muscle Tone: within normal limits Gait & Station: normal Patient leans: N/A  Psychiatric Specialty Exam: Physical Exam  Nursing note and vitals reviewed. Constitutional: He is oriented to person, place, and time. He appears well-developed and well-nourished.  HENT:  Head: Normocephalic and atraumatic.  Respiratory: Effort normal.  Neurological: He is alert and oriented to person, place, and time.    Review of Systems  Blood pressure 112/65, pulse 74, temperature 98.3 F (36.8 C), temperature source Oral, resp. rate 18, height 6\' 1"  (1.854 m), weight 97.5 kg, SpO2 100 %.Body mass index is 28.37 kg/m.  General Appearance: Casual  Eye Contact:  Fair  Speech:   Normal Rate  Volume:  Decreased  Mood:  Euthymic  Affect:  Congruent  Thought Process:  Coherent and Descriptions of Associations: Intact  Orientation:  Full (Time, Place, and Person)  Thought Content:  Logical  Suicidal Thoughts:  No  Homicidal Thoughts:  No  Memory:  Immediate;   Fair Recent;   Fair Remote;   Fair  Judgement:  Intact  Insight:  Fair  Psychomotor Activity:  Normal  Concentration:  Concentration: Good and Attention Span: Good  Recall:  Good  Fund of Knowledge:  Fair  Language:  Good  Akathisia:  Negative  Handed:  Right  AIMS (if indicated):     Assets:  Desire for Improvement Housing Resilience Social Support  ADL's:  Intact  Cognition:  WNL  Sleep:  Number of Hours: 7.75     Have you used any form of tobacco in the last 30 days? (Cigarettes, Smokeless Tobacco, Cigars, and/or Pipes): Yes  Has this patient used any form of tobacco in the last 30 days? (Cigarettes, Smokeless Tobacco, Cigars, and/or Pipes) Yes, No  Blood Alcohol level:  Lab Results  Component Value Date   ETH <10 11/09/2019   ETH <5 08/11/2016    Metabolic Disorder Labs:  Lab Results  Component Value Date   HGBA1C 5.4 08/15/2016   MPG 108 08/15/2016   MPG 123 11/26/2015   Lab Results  Component Value Date   PROLACTIN 33.2 (H) 08/15/2016   Lab Results  Component Value Date   CHOL 163 11/11/2019   TRIG 81 11/11/2019   HDL 39 (L) 11/11/2019   CHOLHDL 4.2 11/11/2019   VLDL 16 11/11/2019   LDLCALC 108 (H) 11/11/2019   LDLCALC 95 08/15/2016    See Psychiatric Specialty Exam and Suicide Risk Assessment completed by Attending Physician prior to discharge.  Discharge destination:  Home  Is patient on multiple antipsychotic therapies at discharge:  No   Has Patient had three or more failed trials of antipsychotic monotherapy by history:  No  Recommended Plan for Multiple Antipsychotic Therapies: NA  Discharge Instructions    Diet - low sodium heart healthy   Complete by:  As directed    Increase activity slowly   Complete by: As directed      Allergies as of 11/16/2019      Reactions   Haldol [haloperidol Lactate] Other (See Comments)   Muscle stiffness   Abilify [aripiprazole]    eps   Penicillins Hives   Felt like throat was closing Has patient had a PCN reaction causing immediate rash, facial/tongue/throat swelling, SOB or lightheadedness with hypotension: Unknown Has patient had a PCN reaction causing severe rash involving mucus membranes or skin necrosis: unknown Has patient had a PCN reaction that required hospitalization Unknown Has patient had a PCN reaction occurring within the last 10 years: Unknown If all of the above answers are "NO", then may proceed with Cephalosporin use.   Zyprexa [olanzapine]    eps      Medication List    STOP taking these medications   buPROPion 100 MG tablet Commonly known as: WELLBUTRIN Replaced by: buPROPion 150 MG 12 hr tablet   cephALEXin 500 MG capsule Commonly known as: Keflex   divalproex 250 MG 24 hr tablet Commonly known as: DEPAKOTE ER Replaced by: divalproex 250 MG DR tablet     TAKE these medications     Indication  buPROPion 150 MG 12 hr tablet Commonly known as: WELLBUTRIN SR Take 1 tablet (150 mg total) by mouth 2 (two) times daily. Replaces: buPROPion 100 MG tablet  Indication: Depressive Phase of Manic-Depression   divalproex 250 MG DR tablet Commonly known as: DEPAKOTE Take 3 tablets (750 mg total) by mouth every evening. Replaces: divalproex 250 MG 24 hr tablet  Indication: Depressive Phase of Manic-Depression   hydrOXYzine 25 MG tablet Commonly known as: ATARAX/VISTARIL Take 1 tablet (25 mg total) by mouth 3 (three) times daily as needed for anxiety. What changed: when to take this  Indication: Feeling Anxious   QUEtiapine 300 MG tablet Commonly known as: SEROQUEL Take 1 tablet (300 mg total) by mouth at bedtime. What changed:   medication strength  how much to  take  additional instructions  Indication: Depressive Phase of Manic-Depression   traZODone 100 MG tablet Commonly known as: DESYREL Take 1 tablet (100 mg total) by mouth at bedtime as needed for sleep.  Indication: Trouble Sleeping      Follow-up Information    [facility] Follow up.   Why: Appointment is scheduled for 5/17 @ 8:30 am.  Appointment is virtual. Thanks! Contact information:  [address] [place] [address] [phone]           Follow-up recommendations:  Activity:  Ad lib. Other:  Take medications as directed, do not use any illegal substances, follow-up with your psychiatric appointments.  Comments: Take medications as directed, do not use any illegal substances, follow-up with your psychiatric appointments.  Should psychotic symptoms, suicidal symptoms return then present back to the hospital for evaluation.  Signed: [name], MD 11/16/2019, 12:38 PM

## 2019-12-15 ENCOUNTER — Emergency Department (HOSPITAL_COMMUNITY)
Admission: EM | Admit: 2019-12-15 | Discharge: 2019-12-16 | Disposition: A | Discharge status: Home / Self Care | Payer: Medicaid Other | Attending: Emergency Medicine | Admitting: Emergency Medicine

## 2019-12-15 ENCOUNTER — Encounter (HOSPITAL_COMMUNITY): Payer: Self-pay

## 2019-12-15 ENCOUNTER — Other Ambulatory Visit: Payer: Self-pay

## 2019-12-15 DIAGNOSIS — F250 Schizoaffective disorder, bipolar type: Secondary | ICD-10-CM | POA: Diagnosis present

## 2019-12-15 DIAGNOSIS — R45850 Homicidal ideations: Secondary | ICD-10-CM

## 2019-12-15 DIAGNOSIS — F17210 Nicotine dependence, cigarettes, uncomplicated: Secondary | ICD-10-CM | POA: Insufficient documentation

## 2019-12-15 DIAGNOSIS — Z20822 Contact with and (suspected) exposure to covid-19: Secondary | ICD-10-CM | POA: Insufficient documentation

## 2019-12-15 DIAGNOSIS — Z79899 Other long term (current) drug therapy: Secondary | ICD-10-CM | POA: Insufficient documentation

## 2019-12-15 DIAGNOSIS — R45851 Suicidal ideations: Secondary | ICD-10-CM

## 2019-12-15 DIAGNOSIS — F1210 Cannabis abuse, uncomplicated: Secondary | ICD-10-CM | POA: Insufficient documentation

## 2019-12-15 LAB — CBC
HCT: 46.9 % (ref 39.0–52.0)
Hemoglobin: 15.4 g/dL (ref 13.0–17.0)
MCH: 30.7 pg (ref 26.0–34.0)
MCHC: 32.8 g/dL (ref 30.0–36.0)
MCV: 93.6 fL (ref 80.0–100.0)
Platelets: 218 10*3/uL (ref 150–400)
RBC: 5.01 MIL/uL (ref 4.22–5.81)
RDW: 13.2 % (ref 11.5–15.5)
WBC: 8.5 10*3/uL (ref 4.0–10.5)
nRBC: 0 % (ref 0.0–0.2)

## 2019-12-15 LAB — RAPID URINE DRUG SCREEN, HOSP PERFORMED
Amphetamines: NOT DETECTED
Barbiturates: NOT DETECTED
Benzodiazepines: NOT DETECTED
Cocaine: NOT DETECTED
Opiates: NOT DETECTED
Tetrahydrocannabinol: POSITIVE — AB

## 2019-12-15 LAB — SALICYLATE LEVEL: Salicylate Lvl: <7 mg/dL — ABNORMAL LOW (ref 7.0–30.0)

## 2019-12-15 LAB — ETHANOL: Alcohol, Ethyl (B): <10 mg/dL (ref <10)

## 2019-12-15 LAB — COMPREHENSIVE METABOLIC PANEL
ALT: 24 U/L (ref 0–44)
AST: 28 U/L (ref 15–41)
Albumin: 4.5 g/dL (ref 3.5–5.0)
Alkaline Phosphatase: 42 U/L (ref 38–126)
Anion gap: 8 (ref 5–15)
BUN: 19 mg/dL (ref 6–20)
CO2: 26 mmol/L (ref 22–32)
Calcium: 9.1 mg/dL (ref 8.9–10.3)
Chloride: 105 mmol/L (ref 98–111)
Creatinine, Ser: 1.18 mg/dL (ref 0.61–1.24)
GFR calc Af Amer: >60 mL/min (ref >60)
GFR calc non Af Amer: >60 mL/min (ref >60)
Glucose, Bld: 74 mg/dL (ref 70–99)
Potassium: 4.1 mmol/L (ref 3.5–5.1)
Sodium: 139 mmol/L (ref 135–145)
Total Bilirubin: 0.9 mg/dL (ref 0.3–1.2)
Total Protein: 8.2 g/dL — ABNORMAL HIGH (ref 6.5–8.1)

## 2019-12-15 LAB — ACETAMINOPHEN LEVEL: Acetaminophen (Tylenol), Serum: <10 ug/mL — ABNORMAL LOW (ref 10–30)

## 2019-12-15 LAB — SARS CORONAVIRUS 2 BY RT PCR (HOSPITAL ORDER, PERFORMED IN ~~LOC~~ HOSPITAL LAB): SARS Coronavirus 2: NEGATIVE

## 2019-12-15 MED ORDER — QUETIAPINE FUMARATE 300 MG PO TABS
300.0000 mg | ORAL_TABLET | Freq: Every day | ORAL | Status: DC
  Administered 2019-12-16: 300 mg via ORAL
  Filled 2019-12-15: qty 1

## 2019-12-15 MED ORDER — DIVALPROEX SODIUM 500 MG PO DR TAB
750.0000 mg | DELAYED_RELEASE_TABLET | Freq: Every evening | ORAL | Status: DC
  Administered 2019-12-16: 750 mg via ORAL
  Filled 2019-12-15: qty 1

## 2019-12-15 MED ORDER — TRAZODONE HCL 100 MG PO TABS: 100.0000 mg | ORAL_TABLET | Freq: Every evening | ORAL | Status: DC | PRN

## 2019-12-15 MED ORDER — LORAZEPAM 1 MG PO TABS
1.0000 mg | ORAL_TABLET | Freq: Once | ORAL | Status: AC
  Administered 2019-12-15: 1 mg via ORAL
  Filled 2019-12-15: qty 1

## 2019-12-15 MED ORDER — HYDROXYZINE HCL 25 MG PO TABS: 25.0000 mg | ORAL_TABLET | Freq: Three times a day (TID) | ORAL | Status: DC | PRN

## 2019-12-15 NOTE — ED Provider Notes (Signed)
[name]-EMERGENCY DEPT Provider Note   CSN: [id number] Arrival date & time: 12/15/19  1640     History Chief Complaint  Patient presents with  . Anxiety  . Suicidal    Timothy [name] is a 24 y.o. male with pertinent past medical history of paranoid schizophrenia and polysubstance abuse that presents to the ED for acute psychosis.  Patient was seen in the parking lot of [name] where he is employed having an episode of screaming and running around after he told he was going to be a father.  Triage note states that patient was found to have carpopedal spasms.  Was brought here by EMS.  Is not under IVC.  When speaking to the patient, patient does not want to speak to me, only nods yes or no when asking questions.  He is able to motion to me as well.  Told him I will give him some Ativan and then reassess him later.  After reassessment patient still did not want to talk to me.  Is able to nod that he is suicidal.  Is able to also nod that he is homicidal to all people at this time.  Is not able to communicate to me if it is an open-ended question, cannot tell me if he has a plan.  Would not tell me what happened.  Is denying pain anywhere.  When asked what normally calms him down, he makes motion of smoking weed.  Does not seem to be hallucinating at this time, however patient does not answer this question.  Was recently seen at [facility] emergency department for the same, was admitted.  Was discharged 5/14.  HPI     Past Medical History:  Diagnosis Date  . Cannabis abuse   . Paranoid schizophrenia (HCC)   . Schizoaffective disorder, depressive type (HCC) 09/17/2014    Patient Active Problem List   Diagnosis Date Noted  . Schizoaffective disorder (HCC) 11/10/2019  . Benzodiazepine abuse (HCC) 08/13/2016  . Schizoaffective disorder, depressive type (HCC) 08/12/2016  . Schizoaffective disorder, bipolar type (HCC) 09/18/2014  . Tobacco use  disorder 09/18/2014  . 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 Cannabis use disorder, severe, dependence (HCC) 09/17/2014    History reviewed. No pertinent surgical history.     Family History  Problem Relation Age of Onset  . Mental illness Brother   . Drug abuse Brother   . Mental illness Cousin   . Suicidality Cousin   . Diabetes Maternal Grandmother     Social History   Tobacco Use  . Smoking status: Current Some Day Smoker    Packs/day: 0.25  . Smokeless tobacco: Never Used  Vaping Use  . Vaping Use: Never used  Substance Use Topics  . Alcohol use: Yes    Alcohol/week: 1.0 standard drink    Types: 1 Shots of liquor per week  . Drug use: Yes    Types: Marijuana    Home Medications Prior to Admission medications   Medication Sig Start Date End Date Taking? Authorizing Provider  buPROPion (WELLBUTRIN SR) 150 MG 12 hr tablet Take 1 tablet (150 mg total) by mouth 2 (two) times daily. 11/16/19   [name], MD  divalproex (DEPAKOTE) 250 MG DR tablet Take 3 tablets (750 mg total) by mouth every evening. 11/16/19   [name], MD  hydrOXYzine (ATARAX/VISTARIL) 25 MG tablet Take 1 tablet (25 mg total) by mouth 3 (three) times daily as needed for anxiety. 11/16/19  [name], MD  QUEtiapine (SEROQUEL) 300 MG tablet Take 1 tablet (300 mg total) by mouth at bedtime. 11/16/19   [name], MD  traZODone (DESYREL) 100 MG tablet Take 1 tablet (100 mg total) by mouth at bedtime as needed for sleep. 11/16/19   [name], MD    Allergies    Haldol [haloperidol lactate], Abilify [aripiprazole], Penicillins, and Zyprexa [olanzapine]  Review of Systems   Review of Systems  Unable to perform ROS: Patient nonverbal  Patient is level 5 caveat as he is nonverbal and not responding to any of my questions without nodding.  Physical Exam Updated Vital Signs BP (!) 146/75 (BP Location: Right Arm)   Pulse 60   Temp 99.3 F (37.4 C)  (Oral)   Resp 16   Ht 6\' 3"  (1.905 m)   Wt 90.7 kg   SpO2 100%   BMI 25.00 kg/m   Physical Exam Constitutional:      General: He is not in acute distress.    Appearance: Normal appearance. He is not ill-appearing, toxic-appearing or diaphoretic.  HENT:     Head: Normocephalic and atraumatic.  Eyes:     Extraocular Movements: Extraocular movements intact.     Pupils: Pupils are equal, round, and reactive to light.     Comments: Bilateral injection noted on both conjunctiva.  Cardiovascular:     Rate and Rhythm: Normal rate and regular rhythm.  Pulmonary:     Effort: Pulmonary effort is normal. No respiratory distress.  Musculoskeletal:        General: Normal range of motion.     Cervical back: Normal range of motion.  Skin:    General: Skin is warm and dry.     Capillary Refill: Capillary refill takes less than 2 seconds.  Neurological:     General: No focal deficit present.     Mental Status: He is alert and oriented to person, place, and time.     Gait: Gait normal.  Psychiatric:     Comments: Patient is nonverbal, however will communicate to me by nodding or making hand gestures.  He is not aggressive at this time.     ED Results / Procedures / Treatments   Labs (all labs ordered are listed, but only abnormal results are displayed) Labs Reviewed  COMPREHENSIVE METABOLIC PANEL - Abnormal; Notable for the following components:      Result Value   Total Protein 8.2 (*)    All other components within normal limits  SALICYLATE LEVEL - Abnormal; Notable for the following components:   Salicylate Lvl <7.0 (*)    All other components within normal limits  ACETAMINOPHEN LEVEL - Abnormal; Notable for the following components:   Acetaminophen (Tylenol), Serum <10 (*)    All other components within normal limits  RAPID URINE DRUG SCREEN, HOSP PERFORMED - Abnormal; Notable for the following components:   Tetrahydrocannabinol POSITIVE (*)    All other components within normal  limits  SARS CORONAVIRUS 2 BY RT PCR (HOSPITAL ORDER, PERFORMED IN [facility])  ETHANOL  CBC    EKG None  Radiology No results found.  Procedures Procedures (including critical care time)  Medications Ordered in ED Medications  LORazepam (ATIVAN) tablet 1 mg (1 mg Oral Given 12/15/19 1717)    ED Course  I have reviewed the triage vital signs and the nursing notes.  Pertinent labs & imaging results that were available during my care of the patient were reviewed by me  and considered in my medical decision making (see chart for details).    MDM Rules/Calculators/A&P                          ZAYAAN KOZAK is a 24 y.o. male with pertinent past medical history of paranoid schizophrenia and polysubstance abuse that presents to the ED for acute psychosis.  Had outbreak when he was in the [employer] parking lot after finding out that he was a father.  He is nonverbal in the ER, will nod yes or no.  Does nod yes when asked if he was suicidal and homicidal.  Denies any hallucinations at this time.  I did IVC patient at this time since patient has threat to self and others.  Patient is not complaining of any pain, not know when asked if he was hurting anywhere.  Did give Ativan. patient is medically cleared and awaiting psych eval.  .Pt care was handed off to W.Fondaw PA-C at 900.  Complete history and physical and current plan have been communicated.  Please refer to their note for the remainder of ED care and ultimate disposition. Awaiting TTS consult.  This chart was discussed with Dr. Wilson Singer  who agreed with the care and disposition of the patient.   Final Clinical Impression(s) / ED Diagnoses Final diagnoses:  Suicidal ideations  Homicidal ideation    Rx / DC Orders ED Discharge Orders    None       Alfredia Client, PA-C 12/15/19 2059    Virgel Manifold, MD 12/16/19 [phone]

## 2019-12-15 NOTE — ED Notes (Signed)
Patient given meal tray.

## 2019-12-15 NOTE — [facility] Assessment (Signed)
[facility] Assessment Progress Note  Case was staffed with Starkes FNP who recommended patient be observed and monitored.    

## 2019-12-15 NOTE — BH Assessment (Signed)
Assessment Note  Timothy [name] is an 24 y.o. male that presents this date with IVC after being transported from [name] where he is employed. Per IVC patient has a noted history of schizophrenia brought in by EMS after found in [employer] parking lot reporting thoughts of self harm. Patient also reported ongoing H/I although was not aggressive. Patient will only answer yes and no to questions. Patient was reported to have been screaming and acting bizarre after he was informed he "was going to be a father." Patient is observed to be very disorganized and renders limited history. Patient is observed to just stare at this writer and will nod his head yes and no as this Clinical research associate attempts to assess. Patient does report active S/I (by nodding) although will not respond when asked in reference to a plan or intent. Patient also nods yes to H/I and reports active AVH. Patient again will not verbalize symptoms and does not seem to process the content of this writer's questions at times. Patient was unable for the most part to participate in the assessment accept for information noted above. Information to complete assessment was obtained from admission notes and history.   Per notes this date on arrival. Allena Katz PA writes: Patient  presents with a past medical history of paranoid schizophrenia and polysubstance abuse soup to the ED for acute psychosis. Patient was seen in the parking lot of [name] where he is employed having an episode of screaming and running around after he told he was going to be a father. Triage note states that patient was found to have carpopedal spasms. Was brought here by EMS. When speaking to the patient, patient does not want to speak to me, only nods yes or no when asking questions. He is able to motion to me as well. Told him I will give him some Ativan and then reassess him later. After reassessment patient still did not want to talk to me. Is able to nod that he is suicidal. Is able to  also note that he is homicidal to all people at this time. Is not able to communicate to me if it is an open-ended question, cannot tell me if he has a plan.  Would not tell me what happened.  Is denying pain anywhere.  When asked what normally calms him down, he makes emotion of smoking weed. Does not seem to be hallucinating at this time, however patient does not answer this question. Was recently seen at [facility] emergency department for the same, was admitted. Was discharged 5/14". See Epic notes of 11/10/19.  Patient as noted will not verbally respond to questions. Patient does not appear to be responding to internal stimuli. Patient per history has been on multiple medications to assist with symptom management associated with Schizophrenia. Patient was seen on 11/09/19 at [facility] and was admitted at that time for similar symptoms. Patient has a history of Cannabis use per chart review with UDS pending this date. Case was staffed with Darcella Gasman FNP who recommended patient be observed and monitored.       Diagnosis: Paranoid Schizophrenia (per notes)   Past Medical History:  Past Medical History:  Diagnosis Date  . Cannabis abuse   . Paranoid schizophrenia (HCC)   . Schizoaffective disorder, depressive type (HCC) 09/17/2014    History reviewed. No pertinent surgical history.  Family History:  Family History  Problem Relation Age of Onset  . Mental illness Brother   . Drug abuse  Brother   . Mental illness Cousin   . Suicidality Cousin   . Diabetes Maternal Grandmother     Social History:  reports that he has been smoking. He has been smoking about 0.25 packs per day. He has never used smokeless tobacco. He reports current alcohol use of about 1.0 standard drink of alcohol per week. He reports current drug use. Drug: Marijuana.  Additional Social History:  Alcohol / Drug Use Pain Medications: See MAR Prescriptions: See MAR Over the Counter: See MAR History of alcohol /  drug use?:  (UTA) Longest period of sobriety (when/how long): UTA Negative Consequences of Use:  (UTA) Withdrawal Symptoms:  (UTA)  CIWA: CIWA-Ar BP: (!) 146/75 Pulse Rate: 60 COWS:    Allergies:  Allergies  Allergen Reactions  . Haldol [Haloperidol Lactate] Other (See Comments)    Muscle stiffness  . Abilify [Aripiprazole]     eps  . Penicillins Hives    Felt like throat was closing Has patient had a PCN reaction causing immediate rash, facial/tongue/throat swelling, SOB or lightheadedness with hypotension: Unknown Has patient had a PCN reaction causing severe rash involving mucus membranes or skin necrosis: unknown Has patient had a PCN reaction that required hospitalization Unknown Has patient had a PCN reaction occurring within the last 10 years: Unknown If all of the above answers are "NO", then may proceed with Cephalosporin use.   . Zyprexa [Olanzapine]     eps    Home Medications: (Not in a hospital admission)   OB/GYN Status:  No LMP for male patient.  General Assessment Data Location of Assessment: [facility] ED TTS Assessment: In system Is this a Tele or Face-to-Face Assessment?: Face-to-Face Is this an Initial Assessment or a Re-assessment for this encounter?: Initial Assessment Patient Accompanied by:: N/A Language Other than English: No Living Arrangements:  (UTA pt states "he doesn't know" ) What gender do you identify as?: Male Date Telepsych consult ordered in [facility]: 12/15/19 Marital status: Single Living Arrangements:  (UTA) Can pt return to current living arrangement?: Yes Admission Status: Voluntary Is patient capable of signing voluntary admission?: No Referral Source: Self/Family/Friend Insurance type: Medicaid     Crisis Care Plan Living Arrangements:  (UTA) Legal Guardian:  (Self) Name of Psychiatrist: Transport planner  (per notes) Name of Therapist: Transport planner  (per notes)  Education Status Is patient currently in school?: No Is the patient employed,  unemployed or receiving disability?: Employed  Risk to self with the past 6 months Suicidal Ideation: Yes-Currently Present Has patient been a risk to self within the past 6 months prior to admission? : No Suicidal Intent: No Has patient had any suicidal intent within the past 6 months prior to admission? : No Is patient at risk for suicide?: Yes Suicidal Plan?: No Has patient had any suicidal plan within the past 6 months prior to admission? : No Access to Means: No What has been your use of drugs/alcohol within the last 12 months?: UTA this date  Previous Attempts/Gestures: No How many times?: 0 Other Self Harm Risks:  (NA) Triggers for Past Attempts:  (NA) Intentional Self Injurious Behavior: None Family Suicide History: No Recent stressful life event(s): Other (Comment) (Pt just found out he is to be a father) Persecutory voices/beliefs?: No Depression:  (UTA) Depression Symptoms:  (UTA) Substance abuse history and/or treatment for substance abuse?: No Suicide prevention information given to non-admitted patients: Not applicable  Risk to Others within the past 6 months Homicidal Ideation: Yes-Currently Present Does patient have any lifetime risk  of violence toward others beyond the six months prior to admission? : No Thoughts of Harm to Others: No Current Homicidal Intent: No Current Homicidal Plan: No Access to Homicidal Means: No Identified Victim:  (NA) History of harm to others?: No Assessment of Violence: None Noted Violent Behavior Description:  (NA) Does patient have access to weapons?: No Criminal Charges Pending?: No Does patient have a court date: No Is patient on probation?: No  Psychosis Hallucinations: Auditory, Visual Delusions: None noted  Mental Status Report Appearance/Hygiene: Bizarre Eye Contact: Poor Motor Activity: Freedom of movement Speech: Soft Level of Consciousness: Quiet/awake Mood: Apprehensive Affect: Preoccupied Anxiety Level:  Moderate Thought Processes: Unable to Assess Judgement: Unable to Assess Orientation: Unable to assess Obsessive Compulsive Thoughts/Behaviors: Unable to Assess  Cognitive Functioning Concentration: Unable to Assess Memory: Unable to Assess Is patient IDD: No Insight: Unable to Assess Impulse Control: Unable to Assess Appetite:  (UTA) Have you had any weight changes? :  (UTA) Sleep:  (UTA) Total Hours of Sleep:  (UTA) Vegetative Symptoms:  (UTA)  ADLScreening [facility] Assessment Services) Patient's cognitive ability adequate to safely complete daily activities?: Yes Patient able to express need for assistance with ADLs?: Yes Independently performs ADLs?: Yes (appropriate for developmental age)  Prior Inpatient Therapy Prior Inpatient Therapy: Yes Prior Therapy Dates: 2021, 2020 Prior Therapy Facilty/Provider(s): [facility] Reason for Treatment: MH issues  Prior Outpatient Therapy Prior Outpatient Therapy: Yes Prior Therapy Dates: UTA Prior Therapy Facilty/Provider(s): [facility] per hx Reason for Treatment: Med mang Does patient have an ACCT team?: No Does patient have Intensive In-House Services?  : No Does patient have [facility] services? : Yes Does patient have P4CC services?: No  ADL Screening (condition at time of admission) Patient's cognitive ability adequate to safely complete daily activities?: Yes Is the patient deaf or have difficulty hearing?: No Does the patient have difficulty seeing, even when wearing glasses/contacts?: No Does the patient have difficulty concentrating, remembering, or making decisions?: No Patient able to express need for assistance with ADLs?: Yes Does the patient have difficulty dressing or bathing?: No Independently performs ADLs?: Yes (appropriate for developmental age) Does the patient have difficulty walking or climbing stairs?: No Weakness of Legs: None Weakness of Arms/Hands: None  Home Assistive Devices/Equipment Home Assistive  Devices/Equipment: None  Therapy Consults (therapy consults require a physician order) PT Evaluation Needed: No OT Evalulation Needed: No SLP Evaluation Needed: No Abuse/Neglect Assessment (Assessment to be complete while patient is alone) Abuse/Neglect Assessment Can Be Completed: Yes Physical Abuse: Denies Verbal Abuse: Denies Sexual Abuse: Denies Exploitation of patient/patient's resources: Denies Self-Neglect: Denies Values / Beliefs Cultural Requests During Hospitalization: None Spiritual Requests During Hospitalization: None Consults Spiritual Care Consult Needed: No Transition of Care Team Consult Needed: No Advance Directives (For Healthcare) Does Patient Have a Medical Advance Directive?: No Would patient like information on creating a medical advance directive?: No - Patient declined          Disposition: Case was staffed with Starkes FNP who recommended patient be observed and monitored.       Disposition Initial Assessment Completed for this Encounter: Yes  On Site Evaluation by:   Reviewed with Physician:    Mamie Nick 12/15/2019 6:30 PM

## 2019-12-15 NOTE — ED Triage Notes (Signed)
Patient arrived [facility] from parking lot of [employer] where he is employed.   Call out by bystander patient was having episode of screaming in parking lot and having anxiety attack after being told he was going to be a father. Patient was found to carpopedal spasms.   Patient semi cooperative with ems but can escalate.   Hx. schizophrenia

## 2019-12-15 NOTE — ED Provider Notes (Signed)
Patient handoff from [facility] for follow-up on TTS recommendations.  TTS recommends evaluation the morning.  Patient's medications ordered.  Place in psych hold-will be moved to psych observation area.   Gailen Shelter, [place] 12/15/19 2219    Vanetta Mulders, MD 12/24/19 [phone]

## 2019-12-15 NOTE — ED Triage Notes (Signed)
Patient would only nod his head indicating Yes/No when asked triage questions.

## 2019-12-15 NOTE — ED Notes (Signed)
Patient requested to change out into scrubs. Patient given burgundy scrubs,socks, and belongings bag.   Patient given urinal and encouraged to void.   Patient states he does not want to change right now. Explained to patient he has too due to SI protocol.

## 2019-12-16 ENCOUNTER — Inpatient Hospital Stay (HOSPITAL_COMMUNITY)
Admission: AD | Admit: 2019-12-16 | Discharge: 2019-12-21 | DRG: 885 | Disposition: A | Discharge status: Home / Self Care | Payer: Federal, State, Local not specified - Other | Attending: Psychiatry | Admitting: Psychiatry

## 2019-12-16 ENCOUNTER — Encounter (HOSPITAL_COMMUNITY): Payer: Self-pay | Admitting: Psychiatry

## 2019-12-16 DIAGNOSIS — Z888 Allergy status to other drugs, medicaments and biological substances status: Secondary | ICD-10-CM

## 2019-12-16 DIAGNOSIS — Z813 Family history of other psychoactive substance abuse and dependence: Secondary | ICD-10-CM | POA: Diagnosis not present

## 2019-12-16 DIAGNOSIS — Z9114 Patient's other noncompliance with medication regimen: Secondary | ICD-10-CM | POA: Diagnosis not present

## 2019-12-16 DIAGNOSIS — Z818 Family history of other mental and behavioral disorders: Secondary | ICD-10-CM | POA: Diagnosis not present

## 2019-12-16 DIAGNOSIS — Z7289 Other problems related to lifestyle: Secondary | ICD-10-CM | POA: Diagnosis not present

## 2019-12-16 DIAGNOSIS — G4700 Insomnia, unspecified: Secondary | ICD-10-CM | POA: Diagnosis present

## 2019-12-16 DIAGNOSIS — Z880 Allergy status to penicillin: Secondary | ICD-10-CM

## 2019-12-16 DIAGNOSIS — F17210 Nicotine dependence, cigarettes, uncomplicated: Secondary | ICD-10-CM | POA: Diagnosis present

## 2019-12-16 DIAGNOSIS — Z20822 Contact with and (suspected) exposure to covid-19: Secondary | ICD-10-CM | POA: Diagnosis present

## 2019-12-16 DIAGNOSIS — F250 Schizoaffective disorder, bipolar type: Secondary | ICD-10-CM | POA: Diagnosis present

## 2019-12-16 DIAGNOSIS — R45851 Suicidal ideations: Secondary | ICD-10-CM | POA: Diagnosis present

## 2019-12-16 DIAGNOSIS — Z79899 Other long term (current) drug therapy: Secondary | ICD-10-CM | POA: Diagnosis not present

## 2019-12-16 DIAGNOSIS — F1220 Cannabis dependence, uncomplicated: Secondary | ICD-10-CM | POA: Diagnosis present

## 2019-12-16 DIAGNOSIS — Z79891 Long term (current) use of opiate analgesic: Secondary | ICD-10-CM | POA: Diagnosis not present

## 2019-12-16 MED ORDER — TRAZODONE HCL 100 MG PO TABS
100.0000 mg | ORAL_TABLET | Freq: Every evening | ORAL | Status: DC | PRN
  Administered 2019-12-17 – 2019-12-19 (×3): 100 mg via ORAL
  Filled 2019-12-16 (×3): qty 1

## 2019-12-16 MED ORDER — ALUM & MAG HYDROXIDE-SIMETH 200-200-20 MG/5ML PO SUSP: 30.0000 mL | ORAL | Status: DC | PRN

## 2019-12-16 MED ORDER — DIVALPROEX SODIUM 500 MG PO DR TAB
500.0000 mg | DELAYED_RELEASE_TABLET | Freq: Two times a day (BID) | ORAL | Status: DC
  Administered 2019-12-16 – 2019-12-21 (×10): 500 mg via ORAL
  Filled 2019-12-16 (×7): qty 1
  Filled 2019-12-16: qty 14
  Filled 2019-12-16: qty 1
  Filled 2019-12-16: qty 14
  Filled 2019-12-16: qty 1
  Filled 2019-12-16: qty 14
  Filled 2019-12-16 (×2): qty 1
  Filled 2019-12-16: qty 14
  Filled 2019-12-16 (×2): qty 1

## 2019-12-16 MED ORDER — MAGNESIUM HYDROXIDE 400 MG/5ML PO SUSP: 30.0000 mL | Freq: Every day | ORAL | Status: DC | PRN

## 2019-12-16 MED ORDER — ACETAMINOPHEN 325 MG PO TABS: 650.0000 mg | ORAL_TABLET | Freq: Four times a day (QID) | ORAL | Status: DC | PRN

## 2019-12-16 MED ORDER — DIVALPROEX SODIUM 500 MG PO DR TAB: 500.0000 mg | DELAYED_RELEASE_TABLET | Freq: Two times a day (BID) | ORAL | Status: DC

## 2019-12-16 MED ORDER — HYDROXYZINE HCL 25 MG PO TABS
25.0000 mg | ORAL_TABLET | Freq: Three times a day (TID) | ORAL | Status: DC | PRN
  Administered 2019-12-16 – 2019-12-20 (×5): 25 mg via ORAL
  Filled 2019-12-16 (×3): qty 1
  Filled 2019-12-16: qty 10

## 2019-12-16 MED ORDER — QUETIAPINE FUMARATE 300 MG PO TABS
300.0000 mg | ORAL_TABLET | Freq: Every day | ORAL | Status: DC
  Administered 2019-12-16 – 2019-12-18 (×3): 300 mg via ORAL
  Filled 2019-12-16 (×5): qty 1

## 2019-12-16 NOTE — ED Notes (Signed)
Transportion is been call  for the patient to be transported to [facility]. Patient is IVC and need law-enforcement. Waiting on transport.

## 2019-12-16 NOTE — Tx Team (Cosign Needed)
Initial Treatment Plan 12/16/2019 2:00 PM Timothy [name] FEO:[id number]    PATIENT STRESSORS: Financial difficulties Marital or family conflict Substance abuse   PATIENT STRENGTHS: Ability for insight Active sense of humor Average or above average intelligence Motivation for treatment/growth Physical Health Special hobby/interest Supportive family/friends   PATIENT IDENTIFIED PROBLEMS: anxiety  depression  Suicidal ideations  Visual hallucinations               DISCHARGE CRITERIA:  Ability to meet basic life and health needs Improved stabilization in mood, thinking, and/or behavior Motivation to continue treatment in a less acute level of care Need for constant or close observation no longer present  PRELIMINARY DISCHARGE PLAN: Attend aftercare/continuing care group Outpatient therapy Return to previous living arrangement Return to previous work or school arrangements  PATIENT/FAMILY INVOLVEMENT: This treatment plan has been presented to and reviewed with the patient, Timothy [name].  The patient and family have been given the opportunity to ask questions and make suggestions.  Raylene Miyamoto, RN 12/16/2019, 2:00 PM

## 2019-12-16 NOTE — Progress Notes (Signed)
   12/16/19 2053  COVID-19 Daily Checkoff  Have you had a fever (temp > 37.80C/100F)  in the past 24 hours?  No  If you have had runny nose, nasal congestion, sneezing in the past 24 hours, has it worsened? No  COVID-19 EXPOSURE  Have you traveled outside the state in the past 14 days? No  Have you been in contact with someone with a confirmed diagnosis of COVID-19 or PUI in the past 14 days without wearing appropriate PPE? No  Have you been living in the same home as a person with confirmed diagnosis of COVID-19 or a PUI (household contact)? No  Have you been diagnosed with COVID-19? No

## 2019-12-16 NOTE — Progress Notes (Signed)
   12/16/19 2058  Psych Admission Type (Psych Patients Only)  Admission Status Voluntary  Psychosocial Assessment  Patient Complaints Depression  Eye Contact Fair  Facial Expression Flat  Affect Appropriate to circumstance  Speech Logical/coherent  Interaction Assertive  Motor Activity Other (Comment) (WDL)  Appearance/Hygiene In scrubs  Behavior Characteristics Appropriate to situation  Mood Depressed  Thought Process  Coherency WDL  Content Paranoia  Delusions Paranoid  Perception Hallucinations  Hallucination Visual  Judgment Limited  Confusion None  Danger to Self  Current suicidal ideation? Denies  Agreement Not to Harm Self Yes  Description of Agreement verbal  Danger to Others  Danger to Others None reported or observed
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
## 2019-12-16 NOTE — Progress Notes (Signed)
Per Molli Knock DNP, pt meets criteria for inpatient admission. He has been accepted to bed 501-01. Accepting provider: Dr. Madaline Guthrie Cobos. Number for report: [phone]. Pt may arrive anytime per Marzetta Board [facility]. CSW notified pt's RN 12/16/2019 11:35 AM   Shaquira Moroz S. Alan Ripper, MSW, LCSW Clinical Social Worker 12/16/2019 11:35 AM

## 2019-12-17 DIAGNOSIS — F250 Schizoaffective disorder, bipolar type: Principal | ICD-10-CM

## 2019-12-17 NOTE — Progress Notes (Signed)
   12/17/19 2000  Psych Admission Type (Psych Patients Only)  Admission Status Voluntary  Psychosocial Assessment  Patient Complaints None  Eye Contact Fair  Facial Expression Flat  Affect Appropriate to circumstance  Speech Logical/coherent  Interaction Assertive  Motor Activity Other (Comment) (WDL)  Appearance/Hygiene In scrubs  Behavior Characteristics Cooperative  Mood Pleasant  Thought Process  Coherency WDL  Content Paranoia  Delusions Paranoid  Perception Hallucinations  Hallucination Visual  Judgment Limited  Confusion None  Danger to Self  Current suicidal ideation? Denies  Agreement Not to Harm Self Yes  Description of Agreement verbal  Danger to Others  Danger to Others None reported or observed

## 2019-12-17 NOTE — Progress Notes (Addendum)
Recreation Therapy Notes  Date: 6.14.21 Time: 1000 Location: 500 Hall Dayroom  Group Topic: Triggers  Goal Area(s) Addresses:  Patient will identify biggest triggers. Patient will identify strategies to avoid exposure to triggers Patient will identify strategies to deal with triggers head on.  Behavioral Response: Engaged  Intervention: Worksheet, pencils   Activity: Triggers.  Patients were to identify their biggest triggers.  Patients then identified ways to avoid those triggers and identify strategies to deal with triggers head on when unable to be avoided.  Education: Communication, Discharge Planning  Education Outcome: Acknowledges understanding/In group clarification offered/Needs additional education.   Clinical Observations/Feedback: Pt identified triggers as "vibing with people and someone messes it up", "trying to help people if someone is wrong" and "when you flirting with someone and someone cock blocks you".  Pt expressed he reduces triggers by "keeping them away from people I'm vibing with without getting in trouble", "let them do it themselves since they think they are right" and "get her number before they start cock blocking even more".  Pt stated he will deal with triggers head on by keeping calm and keep parting, take her on a date and let them realize they were wrong when I was right.    Caroll Rancher, LRT/CTRS     Caroll Rancher A 12/17/2019 11:54 AM

## 2019-12-17 NOTE — Tx Team (Signed)
Interdisciplinary Treatment and Diagnostic Plan Update  12/17/2019 Time of Session: 9:00am CHAMPION CORALES MRN: [id number]  Principal Diagnosis: <principal problem not specified>  Secondary Diagnoses: Active Problems:   Schizoaffective disorder, bipolar type (HCC)   Current Medications:  Current Facility-Administered Medications  Medication Dose Route Frequency Provider Last Rate Last Admin   acetaminophen (TYLENOL) tablet 650 mg  650 mg Oral Q6H PRN Patrecia Pour, NP       alum & mag hydroxide-simeth (MAALOX/MYLANTA) 200-200-20 MG/5ML suspension 30 mL  30 mL Oral Q4H PRN Patrecia Pour, NP       divalproex (DEPAKOTE) DR tablet 500 mg  500 mg Oral Q12H Patrecia Pour, NP   500 mg at 12/17/19 9509   hydrOXYzine (ATARAX/VISTARIL) tablet 25 mg  25 mg Oral TID PRN Patrecia Pour, NP   25 mg at 12/16/19 2007   magnesium hydroxide (MILK OF MAGNESIA) suspension 30 mL  30 mL Oral Daily PRN Patrecia Pour, NP       QUEtiapine (SEROQUEL) tablet 300 mg  300 mg Oral QHS Patrecia Pour, NP   300 mg at 12/16/19 2007   traZODone (DESYREL) tablet 100 mg  100 mg Oral QHS PRN Patrecia Pour, NP       PTA Medications: Medications Prior to Admission  Medication Sig Dispense Refill Last Dose   acetaminophen (TYLENOL) 500 MG tablet Take 1,000 mg by mouth every 6 (six) hours as needed for mild pain or moderate pain.      buPROPion (WELLBUTRIN SR) 150 MG 12 hr tablet Take 1 tablet (150 mg total) by mouth 2 (two) times daily. (Patient not taking: Reported on 12/15/2019) 60 tablet 0    divalproex (DEPAKOTE) 250 MG DR tablet Take 3 tablets (750 mg total) by mouth every evening. (Patient not taking: Reported on 12/15/2019) 90 tablet 0    hydrOXYzine (ATARAX/VISTARIL) 25 MG tablet Take 1 tablet (25 mg total) by mouth 3 (three) times daily as needed for anxiety. (Patient not taking: Reported on 12/15/2019) 30 tablet 0    QUEtiapine (SEROQUEL) 300 MG tablet Take 1 tablet (300 mg total) by mouth at  bedtime. (Patient not taking: Reported on 12/15/2019) 30 tablet 0    traZODone (DESYREL) 100 MG tablet Take 1 tablet (100 mg total) by mouth at bedtime as needed for sleep. (Patient not taking: Reported on 12/15/2019) 30 tablet 0     Patient Stressors: Financial difficulties Marital or family conflict Substance abuse  Patient Strengths: Ability for insight Active sense of humor Average or above average intelligence Motivation for treatment/growth Physical Health Special hobby/interest Supportive family/friends  Treatment Modalities: Medication Management, Group therapy, Case management,  1 to 1 session with clinician, Psychoeducation, Recreational therapy.   Physician Treatment Plan for Primary Diagnosis: <principal problem not specified> Long Term Goal(s):     Short Term Goals:    Medication Management: Evaluate patient's response, side effects, and tolerance of medication regimen.  Therapeutic Interventions: 1 to 1 sessions, Unit Group sessions and Medication administration.  Evaluation of Outcomes: Not Met  Physician Treatment Plan for Secondary Diagnosis: Active Problems:   Schizoaffective disorder, bipolar type ([place])  Long Term Goal(s):     Short Term Goals:       Medication Management: Evaluate patient's response, side effects, and tolerance of medication regimen.  Therapeutic Interventions: 1 to 1 sessions, Unit Group sessions and Medication administration.  Evaluation of Outcomes: Not Met   RN Treatment Plan for Primary Diagnosis: <principal problem not specified> Long  Term Goal(s): Knowledge of disease and therapeutic regimen to maintain health will improve  Short Term Goals: Ability to identify and develop effective coping behaviors will improve and Compliance with prescribed medications will improve  Medication Management: RN will administer medications as ordered by provider, will assess and evaluate patient's response and provide education to patient for  prescribed medication. RN will report any adverse and/or side effects to prescribing provider.  Therapeutic Interventions: 1 on 1 counseling sessions, Psychoeducation, Medication administration, Evaluate responses to treatment, Monitor vital signs and CBGs as ordered, Perform/monitor CIWA, COWS, AIMS and Fall Risk screenings as ordered, Perform wound care treatments as ordered.  Evaluation of Outcomes: Not Met   LCSW Treatment Plan for Primary Diagnosis: <principal problem not specified> Long Term Goal(s): Safe transition to appropriate next level of care at discharge, Engage patient in therapeutic group addressing interpersonal concerns.  Short Term Goals: Engage patient in aftercare planning with referrals and resources, Increase social support, Identify triggers associated with mental health/substance abuse issues and Increase skills for wellness and recovery  Therapeutic Interventions: Assess for all discharge needs, 1 to 1 time with Social worker, Explore available resources and support systems, Assess for adequacy in community support network, Educate family and significant other(s) on suicide prevention, Complete Psychosocial Assessment, Interpersonal group therapy.  Evaluation of Outcomes: Not Met  Progress in Treatment: Attending groups: No. New to unit. Participating in groups: No. Taking medication as prescribed: Yes. Toleration medication: Yes. Family/Significant other contact made: No, will contact:  supports if consents are granted. Patient understands diagnosis: No. Discussing patient identified problems/goals with staff: Yes. Medical problems stabilized or resolved: Yes. Denies suicidal/homicidal ideation: Yes.  New problem(s) identified: No, Describe:  CSW assessing  New Short Term/Long Term Goal(s): medication management for mood stabilization; elimination of SI thoughts; development of comprehensive mental wellness/sobriety plan.  Patient Goals:  "Take my  meds."  Discharge Plan or Barriers: Patient recently admitted to unit, CSW assessing for appropriate referrals.   Reason for Continuation of Hospitalization: Anxiety Delusions  Depression Medication stabilization  Estimated Length of Stay: 5-7 days  Attendees: Patient: Timothy [name] 12/17/2019 11:29 AM  Physician: Dr. Mallie Darting 12/17/2019 11:29 AM  Nursing:  12/17/2019 11:29 AM  RN Care Manager: 12/17/2019 11:29 AM  Social Worker: Stephanie Acre, [place] 12/17/2019 11:29 AM  Recreational Therapist:  12/17/2019 11:29 AM  Other:  12/17/2019 11:29 AM  Other:  12/17/2019 11:29 AM  Other: 12/17/2019 11:29 AM    Scribe for Treatment Team: Joellen [place], [place] 12/17/2019 11:29 AM

## 2019-12-17 NOTE — [facility] Suicide Risk Assessment (Signed)
[facility] Admission Suicide Risk Assessment   Nursing information obtained from:  Patient Demographic factors:  Male, Living alone, Adolescent or young adult, Low socioeconomic status Current Mental Status:  Suicidal ideation indicated by patient, Plan includes specific time, place, or method, Intention to act on suicide plan, Self-harm thoughts, Belief that plan would result in death, Suicide plan Loss Factors:  Loss of significant relationship Historical Factors:  Impulsivity Risk Reduction Factors:  Sense of responsibility to family, Employed, Positive social support, Positive therapeutic relationship  Total Time spent with patient: 30 minutes Principal Problem: <principal problem not specified> Diagnosis:  Active Problems:   Schizoaffective disorder, bipolar type (HCC)  Subjective Data: Patient is seen and examined.  Patient is a 24 year old male with a past psychiatric history significant for schizophrenia who was originally brought to the [facility] emergency department after being found in the [employer] parking lot reporting thoughts of self-harm.  Somehow or another this had something to do with him being told that he was "going to be a father".  He discussed that during the interview today, but it sounds more delusional if anything.  He was brought by emergency medical services to the [facility] emergency department.  He did not want to speak to anyone and would only nod yes or no.  He had been discharged from the [facility] inpatient psychiatric service in May 2021.  He was admitted there on 11/10/2019.  His diagnosis was schizoaffective disorder.  At that time he had been off his medications for approximately 2 months.  He admitted to auditory hallucinations at that time.  He stated that his last medications included Seroquel, Wellbutrin, Depakote, trazodone, Vistaril.  He was actually discharged by me on 11/16/2019.  He was discharged on  Wellbutrin, Depakote, hydroxyzine, Seroquel and trazodone.  He still is quite delusional.  He also seems to have thought blocking at this time.  He was admitted to the hospital for evaluation and stabilization.  In treatment team he did mention that he has been smoking marijuana on a daily basis, and "I probably will continue".  Continued Clinical Symptoms:  Alcohol Use Disorder Identification Test Final Score (AUDIT): 8 The "Alcohol Use Disorders Identification Test", Guidelines for Use in Primary Care, Second Edition.  World Science writer [facility]). Score between 0-7:  no or low risk or alcohol related problems. Score between 8-15:  moderate risk of alcohol related problems. Score between 16-19:  high risk of alcohol related problems. Score 20 or above:  warrants further diagnostic evaluation for alcohol dependence and treatment.   CLINICAL FACTORS:   Schizophrenia:   Less than 26 years old Paranoid or undifferentiated type   Musculoskeletal: Strength & Muscle Tone: within normal limits Gait & Station: normal Patient leans: N/A  Psychiatric Specialty Exam: Physical Exam  Nursing note and vitals reviewed. Constitutional: He is oriented to person, place, and time.  HENT:  Head: Normocephalic and atraumatic.  Respiratory: Effort normal.  GI: Normal appearance.  Neurological: He is alert and oriented to person, place, and time.    Review of Systems  Blood pressure (!) 109/56, pulse 84, temperature 98.3 F (36.8 C), temperature source Oral, resp. rate 18, height 6\' 4"  (1.93 m), weight 101.6 kg, SpO2 100 %.Body mass index is 27.27 kg/m.  General Appearance: Disheveled  Eye Contact:  Fair  Speech:  Normal Rate  Volume:  Decreased  Mood:  Dysphoric  Affect:  Congruent  Thought Process:  Goal Directed and Descriptions of  Associations: Circumstantial  Orientation:  Full (Time, Place, and Person)  Thought Content:  Delusions, Hallucinations: Auditory and Paranoid Ideation   Suicidal Thoughts:  No  Homicidal Thoughts:  No  Memory:  Immediate;   Poor Recent;   Poor Remote;   Fair  Judgement:  Impaired  Insight:  Lacking  Psychomotor Activity:  Decreased  Concentration:  Concentration: Fair and Attention Span: Fair  Recall:  [employer] of Knowledge:  Fair  Language:  Fair  Akathisia:  Negative  Handed:  Right  AIMS (if indicated):     Assets:  Desire for Improvement Resilience  ADL's:  Impaired  Cognition:  WNL  Sleep:  Number of Hours: 6.25      COGNITIVE FEATURES THAT CONTRIBUTE TO RISK:  Thought constriction (tunnel vision)    SUICIDE RISK:   Moderate:  Frequent suicidal ideation with limited intensity, and duration, some specificity in terms of plans, no associated intent, good self-control, limited dysphoria/symptomatology, some risk factors present, and identifiable protective factors, including available and accessible social support.  PLAN OF CARE: Patient is seen and examined.  Patient is a 24 year old male with the above-stated past psychiatric history who is admitted secondary to noncompliance with his medications and worsening psychotic symptoms.  He will be admitted to the hospital.  He will be integrated in the milieu.  He will be encouraged to attend groups.  We will continue his Depakote DR 500 mg p.o. every 12 hours.  His Seroquel will also be restarted at 300 mg p.o. nightly.  We will hold off on the Wellbutrin at this point just to make sure that it does not flip him into a manic phase.  Review of his admission laboratories revealed essentially normal electrolytes including liver function enzymes.  His CBC was normal.  Acetaminophen and salicylate were less than 10 and less than 7 respectively.  Blood alcohol was less than 10.  Drug screen was positive for marijuana.  His EKG showed a sinus rhythm with a QTC of 328.  I certify that inpatient services furnished can reasonably be expected to improve the patient's condition.   Sharma Covert, MD 12/17/2019, 3:17 PM

## 2019-12-17 NOTE — Progress Notes (Signed)
Recreation Therapy Notes  INPATIENT RECREATION THERAPY ASSESSMENT  Patient Details Name: Timothy [name] MRN: [id number] DOB: 10/03/1995 Today's Date: 12/17/2019       Information Obtained From: Patient  Able to Participate in Assessment/Interview: Yes  Patient Presentation: Alert  Reason for Admission (Per Patient): Other (Comments) (Mental breakdown; Thinks he may have a son he's never met)  Patient Stressors: Family, Other (Comment) (Not knowing if he has a son)  Coping Skills:   Building control surveyor, Write, Sports, TV, Avoidance, Arguments, Aggression, Music, Exercise, Meditate, Substance Abuse, Impulsivity, Talk, Art, Prayer, Dance, Hot Bath/Shower (Drawing)  Leisure Interests (2+):  Therapist, music - Other (Comment), Community - Shopping mall (Listen to nature; Go to [employer])  Frequency of Recreation/Participation: Other (Comment) (Listen to nature- Daily; Shopping- Every 2-3 months; Beach- Every other year)  Awareness of Community Resources:  Yes  Community Resources:  Gym, Patent examiner, Other (Comment) Personnel officer; Track)  Current Use: Yes  If no, Barriers?:    Expressed Interest in [place]: No  [employer] of Residence:  Insurance underwriter  Patient Main Form of Transportation: Other (Comment) (Other people)  Patient Strengths:  Good personality; Like to have fun  Patient Identified Areas of Improvement:  Anger  Patient Goal for Hospitalization:  "try to get back to myself and work on keeping myself calm"  Current SI (including self-harm):  No  Current HI:  Yes (Rated an 8; Contracts for safety)  Current AVH: Yes (Seeing black spots)  Staff Intervention Plan: Group Attendance, Collaborate with Interdisciplinary Treatment Team  Consent to Intern Participation: N/A    Victorino Sparrow, LRT/CTRS   Victorino Sparrow A 12/17/2019, 12:36 PM

## 2019-12-17 NOTE — [facility] Suicide Risk Assessment (Deleted)
[facility] Discharge Suicide Risk Assessment   Principal Problem: <principal problem not specified> Discharge Diagnoses: Active Problems:   Schizoaffective disorder, bipolar type (HCC)   Total Time spent with patient: 30 minutes  Musculoskeletal: Strength & Muscle Tone: within normal limits Gait & Station: normal Patient leans: N/A  Psychiatric Specialty Exam: Review of Systems  All other systems reviewed and are negative.   Blood pressure (!) 109/56, pulse 84, temperature 98.3 F (36.8 C), temperature source Oral, resp. rate 18, height 6\' 4"  (1.93 m), weight 101.6 kg, SpO2 100 %.Body mass index is 27.27 kg/m.  General Appearance: Casual  Eye Contact::  Good  Speech:  Normal Rate409  Volume:  Normal  Mood:  Euthymic  Affect:  Congruent  Thought Process:  Coherent and Descriptions of Associations: Intact  Orientation:  Full (Time, Place, and Person)  Thought Content:  Logical  Suicidal Thoughts:  No  Homicidal Thoughts:  No  Memory:  Immediate;   Fair Recent;   Fair Remote;   Fair  Judgement:  Intact  Insight:  Fair  Psychomotor Activity:  Normal  Concentration:  Fair  Recall:  [phone] of Knowledge:Fair  Language: Good  Akathisia:  Negative  Handed:  Right  AIMS (if indicated):     Assets:  Communication Skills Desire for Improvement Housing Resilience Social Support Talents/Skills  Sleep:  Number of Hours: 6.25  Cognition: WNL  ADL's:  Intact   Mental Status Per Nursing Assessment::   On Admission:  Suicidal ideation indicated by patient, Plan includes specific time, place, or method, Intention to act on suicide plan, Self-harm thoughts, Belief that plan would result in death, Suicide plan  Demographic Factors:  Male, Low socioeconomic status and Unemployed  Loss Factors: Financial problems/change in socioeconomic status  Historical Factors: Impulsivity  Risk Reduction Factors:   Living with another person, especially a relative and Positive social  support  Continued Clinical Symptoms:  Alcohol/Substance Abuse/Dependencies Schizophrenia:   Less than 90 years old Paranoid or undifferentiated type  Cognitive Features That Contribute To Risk:  None    Suicide Risk:  Minimal: No identifiable suicidal ideation.  Patients presenting with no risk factors but with morbid ruminations; may be classified as minimal risk based on the severity of the depressive symptoms    Plan Of Care/Follow-up recommendations:  Activity:  ad lib  41, MD 12/17/2019, 2:36 PM

## 2019-12-17 NOTE — [facility] Suicide Risk Assessment (Signed)
[facility] INPATIENT:  Family/Significant Other Suicide Prevention Education  Suicide Prevention Education: Education Completed; Timothy [name], Timothy [name] [phone]),  has been identified by the patient as the family member/significant other with whom the patient will be residing, and identified as the person(s) who will aid the patient in the event of a mental health crisis (suicidal ideations/suicide attempt).  With written consent from the patient, the family member/significant other has been provided the following suicide prevention education, prior to the and/or following the discharge of the patient.  The suicide prevention education provided includes the following:  Suicide risk factors  Suicide prevention and interventions  National Suicide Hotline telephone number  [facility] assessment telephone number  [facility] Emergency Assistance 911  [facility] and/or Residential Mobile Crisis Unit telephone number   Request made of family/significant other to:  Remove weapons (e.g., guns, rifles, knives), all items previously/currently identified as safety concern.    Remove drugs/medications (over-the-counter, prescriptions, illicit drugs), all items previously/currently identified as a safety concern.   The family member/significant other verbalizes understanding of the suicide prevention education information provided.  The family member/significant other agrees to remove the items of safety concern listed above.   CSW spoke with patients Grandmother Timothy [name], who stated that she was concerned with how pt has been acting recently. She reported that this patient has isolated more and has become "seceretive" with his behaviors. She also reported that he had stashed some of her belongings at her house in attempts to come back and take them at a later time. Pt's grandmother reported that she did not believe he had any weapons in the home and has no safety  concerns at this time.   Ruthann Cancer MSW, [employer] Clincal Social Worker  [facility]

## 2019-12-17 NOTE — H&P (Signed)
Psychiatric Admission Assessment Adult  Patient Identification: Timothy [name] MRN:  [id number] Date of Evaluation:  12/17/2019 Chief Complaint:  Schizoaffective disorder, bipolar type ([place]) [F25.0] Principal Diagnosis: <principal problem not specified> Diagnosis:  Active Problems:   Schizoaffective disorder, bipolar type ([place])  History of Present Illness: Patient is seen and examined.  Patient is a 24 year old male with a past psychiatric history significant for schizophrenia who was originally brought to the [facility] emergency department after being found in the [employer] parking lot reporting thoughts of self-harm.  Somehow or another this had something to do with him being told that he was "going to be a father".  He discussed that during the interview today, but it sounds more delusional if anything.  He was brought by emergency medical services to the [facility] emergency department.  He did not want to speak to anyone and would only nod yes or no.  He had been discharged from the [facility] inpatient psychiatric service in May 2021.  He was admitted there on 11/10/2019.  His diagnosis was schizoaffective disorder.  At that time he had been off his medications for approximately 2 months.  He admitted to auditory hallucinations at that time.  He stated that his last medications included Seroquel, Wellbutrin, Depakote, trazodone, Vistaril.  He was actually discharged by me on 11/16/2019.  He was discharged on Wellbutrin, Depakote, hydroxyzine, Seroquel and trazodone.  He still is quite delusional.  He also seems to have thought blocking at this time.  He was admitted to the hospital for evaluation and stabilization.  In treatment team he did mention that he has been smoking marijuana on a daily basis, and "I probably will continue".   Associated Signs/Symptoms: Depression Symptoms:  depressed mood, insomnia, psychomotor  agitation, feelings of worthlessness/guilt, difficulty concentrating, suicidal thoughts without plan, anxiety, loss of energy/fatigue, disturbed sleep, (Hypo) Manic Symptoms:  Delusions, Distractibility, Hallucinations, Impulsivity, Irritable Mood, Labiality of Mood, Anxiety Symptoms:  Excessive Worry, Psychotic Symptoms:  Delusions, Hallucinations: Auditory Paranoia, PTSD Symptoms: Negative Total Time spent with patient: 45 minutes  Past Psychiatric History: Patient has had multiple psychiatric hospitalizations.  He has been hospitalized 7 times to our institution in the last 5 years.  He has been diagnosed with schizoaffective disorder.  He has been treated with multiple medications in the past including trazodone, Seroquel, hydroxyzine, Depakote, Abilify, Wellbutrin.  Is the patient at risk to self? Yes.    Has the patient been a risk to self in the past 6 months? Yes.    Has the patient been a risk to self within the distant past? Yes.    Is the patient a risk to others? No.  Has the patient been a risk to others in the past 6 months? No.  Has the patient been a risk to others within the distant past? No.   Prior Inpatient Therapy:   Prior Outpatient Therapy:    Alcohol Screening: 1. How often do you have a drink containing alcohol?: 2 to 3 times a week 2. How many drinks containing alcohol do you have on a typical day when you are drinking?: 5 or 6 3. How often do you have six or more drinks on one occasion?: Weekly AUDIT-C Score: 8 4. How often during the last year have you found that you were not able to stop drinking once you had started?: Never 5. How often during the last year have you failed to do what was normally expected  from you because of drinking?: Never 6. How often during the last year have you needed a first drink in the morning to get yourself going after a heavy drinking session?: Never 7. How often during the last year have you had a feeling of guilt of  remorse after drinking?: Never 8. How often during the last year have you been unable to remember what happened the night before because you had been drinking?: Never 9. Have you or someone else been injured as a result of your drinking?: No 10. Has a relative or friend or a doctor or another health worker been concerned about your drinking or suggested you cut down?: No Alcohol Use Disorder Identification Test Final Score (AUDIT): 8 Substance Abuse History in the last 12 months:  Yes.   Consequences of Substance Abuse: Medical Consequences:  It would suggest that it is affecting his psychosis with the marijuana. Previous Psychotropic Medications: Yes  Psychological Evaluations: Yes  Past Medical History:  Past Medical History:  Diagnosis Date  . Cannabis abuse   . Paranoid schizophrenia (HCC)   . Schizoaffective disorder, depressive type (HCC) 09/17/2014   History reviewed. No pertinent surgical history. Family History:  Family History  Problem Relation Age of Onset  . Mental illness Brother   . Drug abuse Brother   . Mental illness Cousin   . Suicidality Cousin   . Diabetes Maternal Grandmother    Family Psychiatric  History: His brother has an unspecified mental illness and also abuses substances. Tobacco Screening:   Social History:  Social History   Substance and Sexual Activity  Alcohol Use Yes  . Alcohol/week: 4.0 - 6.0 standard drinks  . Types: 4 - 6 Shots of liquor per week     Social History   Substance and Sexual Activity  Drug Use Yes  . Types: Marijuana    Additional Social History:                           Allergies:   Allergies  Allergen Reactions  . Haldol [Haloperidol Lactate] Other (See Comments)    Muscle stiffness  . Abilify [Aripiprazole]     eps  . Penicillins Hives    Felt like throat was closing Has patient had a PCN reaction causing immediate rash, facial/tongue/throat swelling, SOB or lightheadedness with hypotension:  Unknown Has patient had a PCN reaction causing severe rash involving mucus membranes or skin necrosis: unknown Has patient had a PCN reaction that required hospitalization Unknown Has patient had a PCN reaction occurring within the last 10 years: Unknown If all of the above answers are "NO", then may proceed with Cephalosporin use.   . Zyprexa [Olanzapine]     eps   Lab Results:  Results for orders placed or performed during the hospital encounter of 12/15/19 (from the past 48 hour(s))  Comprehensive metabolic panel     Status: Abnormal   Collection Time: 12/15/19  5:18 PM  Result Value Ref Range   Sodium 139 135 - 145 mmol/L   Potassium 4.1 3.5 - 5.1 mmol/L   Chloride 105 98 - 111 mmol/L   CO2 26 22 - 32 mmol/L   Glucose, Bld 74 70 - 99 mg/dL    Comment: Glucose reference range applies only to samples taken after fasting for at least 8 hours.   BUN 19 6 - 20 mg/dL   Creatinine, Ser 1.47 0.61 - 1.24 mg/dL   Calcium 9.1 8.9 - 82.9 mg/dL  Total Protein 8.2 (H) 6.5 - 8.1 g/dL   Albumin 4.5 3.5 - 5.0 g/dL   AST 28 15 - 41 U/L   ALT 24 0 - 44 U/L   Alkaline Phosphatase 42 38 - 126 U/L   Total Bilirubin 0.9 0.3 - 1.2 mg/dL   GFR calc non Af Amer >60 >60 mL/min   GFR calc Af Amer >60 >60 mL/min   Anion gap 8 5 - 15    Comment: Performed at [facility], [address] [place], [place] [address]  Ethanol     Status: None   Collection Time: 12/15/19  5:18 PM  Result Value Ref Range   Alcohol, Ethyl (B) <10 <10 mg/dL    Comment: (NOTE) Lowest detectable limit for serum alcohol is 10 mg/dL.  For medical purposes only. Performed at [facility], [address] [place], [place] [address]   Salicylate level     Status: Abnormal   Collection Time: 12/15/19  5:18 PM  Result Value Ref Range   Salicylate Lvl <7.0 (L) 7.0 - 30.0 mg/dL    Comment: Performed at [facility], [address] [place] [address]   Acetaminophen level     Status: Abnormal   Collection Time: 12/15/19  5:18 PM  Result Value Ref Range   Acetaminophen (Tylenol), Serum <10 (L) 10 - 30 ug/mL    Comment: (NOTE) Therapeutic concentrations vary significantly. A range of 10-30 ug/mL  may be an effective concentration for many patients. However, some  are best treated at concentrations outside of this range. Acetaminophen concentrations >150 ug/mL at 4 hours after ingestion  and >50 ug/mL at 12 hours after ingestion are often associated with  toxic reactions.  Performed at [facility], [address] [place], [place] [address]   cbc     Status: None   Collection Time: 12/15/19  5:18 PM  Result Value Ref Range   WBC 8.5 4.0 - 10.5 K/uL   RBC 5.01 4.22 - 5.81 MIL/uL   Hemoglobin 15.4 13.0 - 17.0 g/dL   HCT 29.546.9 39 - 52 %   MCV 93.6 80.0 - 100.0 fL   MCH 30.7 26.0 - 34.0 pg   MCHC 32.8 30.0 - 36.0 g/dL   RDW 62.113.2 30.811.5 - 65.715.5 %   Platelets 218 150 - 400 K/uL   nRBC 0.0 0.0 - 0.2 %    Comment: Performed at [facility], [address] [place], [place] [address]  Rapid urine drug screen (hospital performed)     Status: Abnormal   Collection Time: 12/15/19  7:43 PM  Result Value Ref Range   Opiates NONE DETECTED NONE DETECTED   Cocaine NONE DETECTED NONE DETECTED   Benzodiazepines NONE DETECTED NONE DETECTED   Amphetamines NONE DETECTED NONE DETECTED   Tetrahydrocannabinol POSITIVE (A) NONE DETECTED   Barbiturates NONE DETECTED NONE DETECTED    Comment: (NOTE) DRUG SCREEN FOR MEDICAL PURPOSES ONLY.  IF CONFIRMATION IS NEEDED FOR ANY PURPOSE, NOTIFY LAB WITHIN 5 DAYS.  LOWEST DETECTABLE LIMITS FOR URINE DRUG SCREEN Drug Class                     Cutoff (ng/mL) Amphetamine and metabolites    1000 Barbiturate and metabolites    200 Benzodiazepine                 200 Tricyclics and metabolites     300 Opiates and metabolites  300 Cocaine and metabolites         300 THC                            50 Performed at [facility], [address] [place], [place] [address]   SARS Coronavirus 2 by RT PCR (hospital order, performed in [facility] hospital lab) Nasopharyngeal     Status: None   Collection Time: 12/15/19  7:43 PM   Specimen: Nasopharyngeal  Result Value Ref Range   SARS Coronavirus 2 NEGATIVE NEGATIVE    Comment: (NOTE) SARS-CoV-2 target nucleic acids are NOT DETECTED.  The SARS-CoV-2 RNA is generally detectable in upper and lower respiratory specimens during the acute phase of infection. The lowest concentration of SARS-CoV-2 viral copies this assay can detect is 250 copies / mL. A negative result does not preclude SARS-CoV-2 infection and should not be used as the sole basis for treatment or other patient management decisions.  A negative result may occur with improper specimen collection / handling, submission of specimen other than nasopharyngeal swab, presence of viral mutation(s) within the areas targeted by this assay, and inadequate number of viral copies (<250 copies / mL). A negative result must be combined with clinical observations, patient history, and epidemiological information.  Fact Sheet for Patients:   BoilerBrush.com.cy  Fact Sheet for Healthcare Providers: https://pope.com/  This test is not yet approved or  cleared by the [place] FDA and has been authorized for detection and/or diagnosis of SARS-CoV-2 by FDA under an Emergency Use Authorization (EUA).  This EUA will remain in effect (meaning this test can be used) for the duration of the COVID-19 declaration under Section 564(b)(1) of the Act, 21 U.S.C. section 360bbb-3(b)(1), unless the authorization is terminated or revoked sooner.  Performed at [facility], [address] [place], [place] [address]     Blood Alcohol level:  Lab Results  Component Value  Date   ETH <10 12/15/2019   ETH <10 11/09/2019    Metabolic Disorder Labs:  Lab Results  Component Value Date   HGBA1C 5.4 08/15/2016   MPG 108 08/15/2016   MPG 123 11/26/2015   Lab Results  Component Value Date   PROLACTIN 33.2 (H) 08/15/2016   Lab Results  Component Value Date   CHOL 163 11/11/2019   TRIG 81 11/11/2019   HDL 39 (L) 11/11/2019   CHOLHDL 4.2 11/11/2019   VLDL 16 11/11/2019   LDLCALC 108 (H) 11/11/2019   LDLCALC 95 08/15/2016    Current Medications: Current Facility-Administered Medications  Medication Dose Route Frequency Provider Last Rate Last Admin  . acetaminophen (TYLENOL) tablet 650 mg  650 mg Oral Q6H PRN Charm Rings, NP      . alum & mag hydroxide-simeth (MAALOX/MYLANTA) 200-200-20 MG/5ML suspension 30 mL  30 mL Oral Q4H PRN Charm Rings, NP      . divalproex (DEPAKOTE) DR tablet 500 mg  500 mg Oral Q12H Charm Rings, NP   500 mg at 12/17/19 9379  . hydrOXYzine (ATARAX/VISTARIL) tablet 25 mg  25 mg Oral TID PRN Charm Rings, NP   25 mg at 12/16/19 2007  . magnesium hydroxide (MILK OF MAGNESIA) suspension 30 mL  30 mL Oral Daily PRN Charm Rings, NP      . QUEtiapine (SEROQUEL) tablet 300 mg  300 mg Oral QHS Charm Rings, NP   300 mg at 12/16/19 2007  .  traZODone (DESYREL) tablet 100 mg  100 mg Oral QHS PRN Charm Rings, NP       PTA Medications: Medications Prior to Admission  Medication Sig Dispense Refill Last Dose  . acetaminophen (TYLENOL) 500 MG tablet Take 1,000 mg by mouth every 6 (six) hours as needed for mild pain or moderate pain.     Marland Kitchen buPROPion (WELLBUTRIN SR) 150 MG 12 hr tablet Take 1 tablet (150 mg total) by mouth 2 (two) times daily. (Patient not taking: Reported on 12/15/2019) 60 tablet 0   . divalproex (DEPAKOTE) 250 MG DR tablet Take 3 tablets (750 mg total) by mouth every evening. (Patient not taking: Reported on 12/15/2019) 90 tablet 0   . hydrOXYzine (ATARAX/VISTARIL) 25 MG tablet Take 1 tablet (25 mg  total) by mouth 3 (three) times daily as needed for anxiety. (Patient not taking: Reported on 12/15/2019) 30 tablet 0   . QUEtiapine (SEROQUEL) 300 MG tablet Take 1 tablet (300 mg total) by mouth at bedtime. (Patient not taking: Reported on 12/15/2019) 30 tablet 0   . traZODone (DESYREL) 100 MG tablet Take 1 tablet (100 mg total) by mouth at bedtime as needed for sleep. (Patient not taking: Reported on 12/15/2019) 30 tablet 0     Musculoskeletal: Strength & Muscle Tone: within normal limits Gait & Station: normal Patient leans: N/A  Psychiatric Specialty Exam: Physical Exam  Nursing note and vitals reviewed. HENT:  Head: Normocephalic and atraumatic.  Respiratory: Effort normal.  GI: Normal appearance.  Neurological: He is alert.    Review of Systems  Blood pressure (!) 109/56, pulse 84, temperature 98.3 F (36.8 C), temperature source Oral, resp. rate 18, height 6\' 4"  (1.93 m), weight 101.6 kg, SpO2 100 %.Body mass index is 27.27 kg/m.  General Appearance: Disheveled  Eye Contact:  Minimal  Speech:  Slow  Volume:  Decreased  Mood:  Dysphoric  Affect:  Congruent  Thought Process:  Goal Directed and Descriptions of Associations: Loose  Orientation:  Full (Time, Place, and Person)  Thought Content:  Delusions, Hallucinations: Auditory and Paranoid Ideation  Suicidal Thoughts:  No  Homicidal Thoughts:  No  Memory:  Immediate;   Fair Recent;   Fair Remote;   Fair  Judgement:  Impaired  Insight:  Lacking  Psychomotor Activity:  Decreased  Concentration:  Concentration: Fair and Attention Span: Fair  Recall:  of Knowledge:  Good  Language:  Fair  Akathisia:  Negative  Handed:  Right  AIMS (if indicated):     Assets:  Desire for Improvement Resilience  ADL's:  Impaired  Cognition:  WNL  Sleep:  Number of Hours: 6.25    Treatment Plan Summary: Daily contact with patient to assess and evaluate symptoms and progress in treatment, Medication management and Plan :  Patient is seen and examined.  Patient is a 24 year old male with the above-stated past psychiatric history who is admitted secondary to noncompliance with his medications and worsening psychotic symptoms.  He will be admitted to the hospital.  He will be integrated in the milieu.  He will be encouraged to attend groups.  We will continue his Depakote DR 500 mg p.o. every 12 hours.  His Seroquel will also be restarted at 300 mg p.o. nightly.  We will hold off on the Wellbutrin at this point just to make sure that it does not flip him into a manic phase.  Review of his admission laboratories revealed essentially normal electrolytes including liver function enzymes.  His CBC  was normal.  Acetaminophen and salicylate were less than 10 and less than 7 respectively.  Blood alcohol was less than 10.  Drug screen was positive for marijuana.  His EKG showed a sinus rhythm with a QTC of 328.  Observation Level/Precautions:  15 minute checks  Laboratory:  Chemistry Profile  Psychotherapy:    Medications:    Consultations:    Discharge Concerns:    Estimated LOS:  Other:     Physician Treatment Plan for Primary Diagnosis: <principal problem not specified> Long Term Goal(s): Improvement in symptoms so as ready for discharge  Short Term Goals: Ability to identify changes in lifestyle to reduce recurrence of condition will improve, Ability to verbalize feelings will improve, Ability to demonstrate self-control will improve, Ability to identify and develop effective coping behaviors will improve, Ability to maintain clinical measurements within normal limits will improve, Compliance with prescribed medications will improve and Ability to identify triggers associated with substance abuse/mental health issues will improve  Physician Treatment Plan for Secondary Diagnosis: Active Problems:   Schizoaffective disorder, bipolar type (HCC)  Long Term Goal(s): Improvement in symptoms so as ready for discharge  Short  Term Goals: Ability to identify changes in lifestyle to reduce recurrence of condition will improve, Ability to verbalize feelings will improve, Ability to demonstrate self-control will improve, Ability to identify and develop effective coping behaviors will improve, Ability to maintain clinical measurements within normal limits will improve, Compliance with prescribed medications will improve and Ability to identify triggers associated with substance abuse/mental health issues will improve  I certify that inpatient services furnished can reasonably be expected to improve the patient's condition.    [name], MD 6/14/20213:25 PM
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
## 2019-12-18 MED ORDER — BUPROPION HCL ER (XL) 150 MG PO TB24
150.0000 mg | ORAL_TABLET | Freq: Every day | ORAL | Status: DC
  Administered 2019-12-18 – 2019-12-21 (×4): 150 mg via ORAL
  Filled 2019-12-18 (×4): qty 1
  Filled 2019-12-18 (×2): qty 7
  Filled 2019-12-18 (×2): qty 1

## 2019-12-18 NOTE — Progress Notes (Signed)
   12/18/19 0956  Psych Admission Type (Psych Patients Only)  Admission Status Voluntary  Psychosocial Assessment  Eye Contact Fair  Facial Expression Flat  Affect Appropriate to circumstance  Speech Logical/coherent  Interaction Assertive  Motor Activity Other (Comment) (WDL)  Appearance/Hygiene In scrubs  Behavior Characteristics Cooperative  Mood Pleasant  Aggressive Behavior  Effect No apparent injury  Thought Process  Coherency WDL  Content Paranoia  Delusions Paranoid  Perception Hallucinations  Hallucination Visual  Judgment Limited  Confusion None  Danger to Self  Current suicidal ideation? Denies  Agreement Not to Harm Self Yes  Description of Agreement verbal  Danger to Others  Danger to Others None reported or observed

## 2019-12-18 NOTE — Progress Notes (Signed)
   12/18/19 2000  Psych Admission Type (Psych Patients Only)  Admission Status Voluntary  Psychosocial Assessment  Patient Complaints None  Eye Contact Fair  Facial Expression Flat  Affect Appropriate to circumstance  Speech Logical/coherent  Interaction Assertive  Motor Activity Other (Comment) (WDL)  Appearance/Hygiene In scrubs  Behavior Characteristics Cooperative  Mood Pleasant  Aggressive Behavior  Effect No apparent injury  Thought Process  Coherency WDL  Content Paranoia  Delusions Paranoid  Perception Hallucinations  Hallucination Visual  Judgment Limited  Confusion None  Danger to Self  Current suicidal ideation? Denies  Agreement Not to Harm Self Yes  Description of Agreement verbal  Danger to Others  Danger to Others None reported or observed

## 2019-12-18 NOTE — Progress Notes (Signed)
[facility] MD Progress Note  12/18/2019 9:43 AM Timothy [name]  MRN:  [id number] Subjective: Patient is a 24 year old male with past psychiatric history significant for schizophrenia who was brought to the [facility] emergency department after being found in the [place] parking lot reporting thoughts of self-harm, yelling, and being disruptive.  Objective: Patient is seen and examined.  Patient is a 24 year old male with the above-stated past psychiatric history who is seen in follow-up.  He is essentially unchanged.  He is a little bit more verbal today.  He remains somewhat paranoid, and did admit to auditory hallucinations as well as visual but have decreased since admission.  He denied suicidal or homicidal ideation this morning.  He still appears to be somewhat depressed.  He was restarted on his Depakote DR and Seroquel yesterday.  I held off on restarting his Wellbutrin to make sure that we would not flip him into a manic phase by unopposed antidepressant medication.  We discussed restarting it today.  His vital signs are stable, he is afebrile.  He slept 7.75 hours last night.  No new laboratories.  Principal Problem: <principal problem not specified> Diagnosis: Active Problems:   Schizoaffective disorder, bipolar type ([place])  Total Time spent with patient: 20 minutes  Past Psychiatric History: See admission H&P  Past Medical History:  Past Medical History:  Diagnosis Date  . Cannabis abuse   . Paranoid schizophrenia ([place])   . Schizoaffective disorder, depressive type ([place]) 09/17/2014   History reviewed. No pertinent surgical history. Family History:  Family History  Problem Relation Age of Onset  . Mental illness Brother   . Drug abuse Brother   . Mental illness Cousin   . Suicidality Cousin   . Diabetes Maternal Grandmother    Family Psychiatric  History: See admission H&P Social History:  Social History   Substance and Sexual Activity  Alcohol Use Yes  .  Alcohol/week: 4.0 - 6.0 standard drinks  . Types: 4 - 6 Shots of liquor per week     Social History   Substance and Sexual Activity  Drug Use Yes  . Types: Marijuana    Social History   Socioeconomic History  . Marital status: Single    Spouse name: Not on file  . Number of children: Not on file  . Years of education: Not on file  . Highest education level: Not on file  Occupational History  . Not on file  Tobacco Use  . Smoking status: Current Some Day Smoker    Packs/day: 0.25  . Smokeless tobacco: Never Used  Vaping Use  . Vaping Use: Never used  Substance and Sexual Activity  . Alcohol use: Yes    Alcohol/week: 4.0 - 6.0 standard drinks    Types: 4 - 6 Shots of liquor per week  . Drug use: Yes    Types: Marijuana  . Sexual activity: Yes    Birth control/protection: Condom, Rhythm  Other Topics Concern  . Not on file  Social History Narrative  . Not on file   Social Determinants of Health   Financial Resource Strain:   . Difficulty of Paying Living Expenses:   Food Insecurity:   . Worried About Charity fundraiser in the Last Year:   . Arboriculturist in the Last Year:   Transportation Needs:   . Film/video editor (Medical):   Marland Kitchen Lack of Transportation (Non-Medical):   Physical Activity:   . Days of Exercise per Week:   .  Minutes of Exercise per Session:   Stress:   . Feeling of Stress :   Social Connections:   . Frequency of Communication with Friends and Family:   . Frequency of Social Gatherings with Friends and Family:   . Attends Religious Services:   . Active Member of Clubs or Organizations:   . Attends Banker Meetings:   Marland Kitchen Marital Status:    Additional Social History:                         Sleep: Good  Appetite:  Fair  Current Medications: Current Facility-Administered Medications  Medication Dose Route Frequency Provider Last Rate Last Admin  . acetaminophen (TYLENOL) tablet 650 mg  650 mg Oral Q6H PRN  Charm Rings, NP      . alum & mag hydroxide-simeth (MAALOX/MYLANTA) 200-200-20 MG/5ML suspension 30 mL  30 mL Oral Q4H PRN Charm Rings, NP      . divalproex (DEPAKOTE) DR tablet 500 mg  500 mg Oral Q12H Charm Rings, NP   500 mg at 12/18/19 0831  . hydrOXYzine (ATARAX/VISTARIL) tablet 25 mg  25 mg Oral TID PRN Charm Rings, NP   25 mg at 12/17/19 2056  . magnesium hydroxide (MILK OF MAGNESIA) suspension 30 mL  30 mL Oral Daily PRN Charm Rings, NP      . QUEtiapine (SEROQUEL) tablet 300 mg  300 mg Oral QHS Charm Rings, NP   300 mg at 12/17/19 2057  . traZODone (DESYREL) tablet 100 mg  100 mg Oral QHS PRN Charm Rings, NP   100 mg at 12/17/19 2056    Lab Results: No results found for this or any previous visit (from the past 48 hour(s)).  Blood Alcohol level:  Lab Results  Component Value Date   ETH <10 12/15/2019   ETH <10 11/09/2019    Metabolic Disorder Labs: Lab Results  Component Value Date   HGBA1C 5.4 08/15/2016   MPG 108 08/15/2016   MPG 123 11/26/2015   Lab Results  Component Value Date   PROLACTIN 33.2 (H) 08/15/2016   Lab Results  Component Value Date   CHOL 163 11/11/2019   TRIG 81 11/11/2019   HDL 39 (L) 11/11/2019   CHOLHDL 4.2 11/11/2019   VLDL 16 11/11/2019   LDLCALC 108 (H) 11/11/2019   LDLCALC 95 08/15/2016    Physical Findings: AIMS: Facial and Oral Movements Muscles of Facial Expression: None, normal Lips and Perioral Area: None, normal Jaw: None, normal Tongue: None, normal,Extremity Movements Upper (arms, wrists, hands, fingers): None, normal Lower (legs, knees, ankles, toes): None, normal, Trunk Movements Neck, shoulders, hips: None, normal, Overall Severity Severity of abnormal movements (highest score from questions above): None, normal Incapacitation due to abnormal movements: None, normal Patient's awareness of abnormal movements (rate only patient's report): No Awareness, Dental Status Current problems with teeth  and/or dentures?: No Does patient usually wear dentures?: No  CIWA:    COWS:     Musculoskeletal: Strength & Muscle Tone: within normal limits Gait & Station: normal Patient leans: N/A  Psychiatric Specialty Exam: Physical Exam  Nursing note and vitals reviewed. Constitutional: He is oriented to person, place, and time.  HENT:  Head: Normocephalic and atraumatic.  Respiratory: Effort normal.  GI: Normal appearance.  Neurological: He is alert and oriented to person, place, and time.    Review of Systems  Blood pressure 113/69, pulse 87, temperature 98.4 F (36.9 C),  temperature source Oral, resp. rate 18, height 6\' 4"  (1.93 m), weight 101.6 kg, SpO2 100 %.Body mass index is 27.27 kg/m.  General Appearance: Casual  Eye Contact:  Minimal  Speech:  Normal Rate  Volume:  Decreased  Mood:  Depressed and Dysphoric  Affect:  Flat  Thought Process:  Coherent and Descriptions of Associations: Circumstantial  Orientation:  Full (Time, Place, and Person)  Thought Content:  Delusions, Hallucinations: Auditory and Paranoid Ideation  Suicidal Thoughts:  No  Homicidal Thoughts:  No  Memory:  Immediate;   Fair Recent;   Fair Remote;   Fair  Judgement:  Intact  Insight:  Lacking  Psychomotor Activity:  Decreased  Concentration:  Concentration: Fair and Attention Span: Fair  Recall:  of Knowledge:  Fair  Language:  Fair  Akathisia:  Negative  Handed:  Right  AIMS (if indicated):     Assets:  Desire for Improvement Resilience  ADL's:  Intact  Cognition:  WNL  Sleep:  Number of Hours: 7.75     Treatment Plan Summary: Daily contact with patient to assess and evaluate symptoms and progress in treatment, Medication management and Plan : Patient is seen and examined.  Patient is a 24 year old male with the above-stated past psychiatric history who is seen in follow-up.   Diagnosis: 1.  Schizoaffective disorder; depressive type versus schizophrenia 2.  Cannabis  dependence  Pertinent findings on examination today: 1.  Continued hallucinations and paranoia but decreased from on admission. 2.  Patient denied suicidal ideation this AM. 3.  Vital signs are stable and his sleep is good. 4.  Discussion on restarting Wellbutrin XL.  Plan: 1.  Continue Depakote DR 500 mg p.o. every 12 hours for mood stability. 2.  Continue hydroxyzine 25 mg p.o. 3 times daily as needed anxiety. 3.  Continue Seroquel 300 mg p.o. nightly for mood stability and psychosis. 4.  Continue trazodone 100 mg p.o. nightly as needed insomnia. 5.  Restart Wellbutrin XL at 150 mg p.o. daily for mood and anxiety. 6.  Disposition planning-in progress.  30, MD 12/18/2019, 9:43 AM

## 2019-12-19 MED ORDER — QUETIAPINE FUMARATE 400 MG PO TABS
400.0000 mg | ORAL_TABLET | Freq: Every day | ORAL | Status: DC
  Administered 2019-12-19: 400 mg via ORAL
  Filled 2019-12-19 (×2): qty 1

## 2019-12-19 NOTE — Progress Notes (Signed)
Psychoeducational Group Note  Date:  12/19/2019 Time:  2032  Group Topic/Focus:  Wrap-Up Group:   The focus of this group is to help patients review their daily goal of treatment and discuss progress on daily workbooks.  Participation Level: Did Not Attend  Participation Quality:  Not Applicable  Affect:  Not Applicable  Cognitive:  Not Applicable  Insight:  Not Applicable  Engagement in Group: Not Applicable  Additional Comments:  The patient did not attend group this evening.   Hazle Coca S 12/19/2019, 8:32 PM

## 2019-12-19 NOTE — Progress Notes (Addendum)
Pt continues to keep to himself much of the evening , pt stated he was doing better and was more talkative when he came out of the room    12/19/19 2000  Psych Admission Type (Psych Patients Only)  Admission Status Voluntary  Psychosocial Assessment  Patient Complaints Anxiety  Eye Contact Fair  Facial Expression Flat  Affect Anxious;Blunted  Speech Logical/coherent  Interaction Assertive  Motor Activity Other (Comment) (WDL)  Appearance/Hygiene In scrubs  Behavior Characteristics Cooperative  Mood Depressed  Aggressive Behavior  Effect No apparent injury  Thought Process  Coherency WDL  Content Paranoia  Delusions Paranoid  Perception Hallucinations  Hallucination Visual  Judgment Limited  Confusion None  Danger to Self  Current suicidal ideation? Denies  Agreement Not to Harm Self Yes  Description of Agreement verbal  Danger to Others  Danger to Others None reported or observed

## 2019-12-19 NOTE — Progress Notes (Signed)
D: Pt presented with delusional thinking, and paranoia.  Pt is calm when speaking to staff and other pt's. A: RN administered medications as prescribed, assessed for needs/concerns and validated pt's feelings. R: Pt preoccupied with wanting to know his discharge date.  Pt responded well to redirection.  Pt interacting with peers and staff.  Pt is safe on the unit.

## 2019-12-19 NOTE — Progress Notes (Signed)
[facility] MD Progress Note  12/19/2019 12:55 PM Timothy [name]  MRN:  [id number] Subjective:  Patient is a 24 year old male with past psychiatric history significant for schizophrenia who was brought to the [facility] emergency department after being found in the [place] parking lot reporting thoughts of self-harm, yelling, and being disruptive.  Objective: Patient is seen and examined.  Patient is a 24 year old male with the above-stated past psychiatric history who is seen in follow-up.  He is slightly better today.  He was able to smile and engage.  He continued to talk about being upset about "his son".  He talks about having the picture of the child, and trying to get it across others how upset he was about the fact that he is never seen him, and does not know where he is.  He stated that the child was 22 years old, and I pointed out that if the child was 42 years old that he would have inseminated the mother at age 28.  He backtracked on that a bit.  He denied auditory visual hallucinations.  He stated he still having trouble sleeping.  He denied any suicidal or homicidal ideation. His vital signs are stable, he is afebrile.  Nursing notes reflect that he slept 6.25 hours last night.  No new laboratories.  Principal Problem: <principal problem not specified> Diagnosis: Active Problems:   Schizoaffective disorder, bipolar type (HCC)  Total Time spent with patient: 20 minutes  Past Psychiatric History: See admission H&P  Past Medical History:  Past Medical History:  Diagnosis Date  . Cannabis abuse   . Paranoid schizophrenia (HCC)   . Schizoaffective disorder, depressive type (HCC) 09/17/2014   History reviewed. No pertinent surgical history. Family History:  Family History  Problem Relation Age of Onset  . Mental illness Brother   . Drug abuse Brother   . Mental illness Cousin   . Suicidality Cousin   . Diabetes Maternal Grandmother    Family Psychiatric  History:  See admission H&P Social History:  Social History   Substance and Sexual Activity  Alcohol Use Yes  . Alcohol/week: 4.0 - 6.0 standard drinks  . Types: 4 - 6 Shots of liquor per week     Social History   Substance and Sexual Activity  Drug Use Yes  . Types: Marijuana    Social History   Socioeconomic History  . Marital status: Single    Spouse name: Not on file  . Number of children: Not on file  . Years of education: Not on file  . Highest education level: Not on file  Occupational History  . Not on file  Tobacco Use  . Smoking status: Current Some Day Smoker    Packs/day: 0.25  . Smokeless tobacco: Never Used  Vaping Use  . Vaping Use: Never used  Substance and Sexual Activity  . Alcohol use: Yes    Alcohol/week: 4.0 - 6.0 standard drinks    Types: 4 - 6 Shots of liquor per week  . Drug use: Yes    Types: Marijuana  . Sexual activity: Yes    Birth control/protection: Condom, Rhythm  Other Topics Concern  . Not on file  Social History Narrative  . Not on file   Social Determinants of Health   Financial Resource Strain:   . Difficulty of Paying Living Expenses:   Food Insecurity:   . Worried About Programme researcher, broadcasting/film/video in the Last Year:   . [employer]  in the Last Year:   Transportation Needs:   . Freight forwarder (Medical):   Marland Kitchen Lack of Transportation (Non-Medical):   Physical Activity:   . Days of Exercise per Week:   . Minutes of Exercise per Session:   Stress:   . Feeling of Stress :   Social Connections:   . Frequency of Communication with Friends and Family:   . Frequency of Social Gatherings with Friends and Family:   . Attends Religious Services:   . Active Member of Clubs or Organizations:   . Attends Banker Meetings:   Marland Kitchen Marital Status:    Additional Social History:                         Sleep: Fair  Appetite:  Good  Current Medications: Current Facility-Administered Medications  Medication Dose  Route Frequency Provider Last Rate Last Admin  . acetaminophen (TYLENOL) tablet 650 mg  650 mg Oral Q6H PRN Charm Rings, NP      . alum & mag hydroxide-simeth (MAALOX/MYLANTA) 200-200-20 MG/5ML suspension 30 mL  30 mL Oral Q4H PRN Charm Rings, NP      . buPROPion (WELLBUTRIN XL) 24 hr tablet 150 mg  150 mg Oral Daily [name], MD   150 mg at 12/19/19 0914  . divalproex (DEPAKOTE) DR tablet 500 mg  500 mg Oral Q12H Charm Rings, NP   500 mg at 12/19/19 0915  . hydrOXYzine (ATARAX/VISTARIL) tablet 25 mg  25 mg Oral TID PRN Charm Rings, NP   25 mg at 12/17/19 2056  . magnesium hydroxide (MILK OF MAGNESIA) suspension 30 mL  30 mL Oral Daily PRN Charm Rings, NP      . QUEtiapine (SEROQUEL) tablet 300 mg  300 mg Oral QHS Charm Rings, NP   300 mg at 12/18/19 2229  . traZODone (DESYREL) tablet 100 mg  100 mg Oral QHS PRN Charm Rings, NP   100 mg at 12/18/19 2229    Lab Results: No results found for this or any previous visit (from the past 48 hour(s)).  Blood Alcohol level:  Lab Results  Component Value Date   ETH <10 12/15/2019   ETH <10 11/09/2019    Metabolic Disorder Labs: Lab Results  Component Value Date   HGBA1C 5.4 08/15/2016   MPG 108 08/15/2016   MPG 123 11/26/2015   Lab Results  Component Value Date   PROLACTIN 33.2 (H) 08/15/2016   Lab Results  Component Value Date   CHOL 163 11/11/2019   TRIG 81 11/11/2019   HDL 39 (L) 11/11/2019   CHOLHDL 4.2 11/11/2019   VLDL 16 11/11/2019   LDLCALC 108 (H) 11/11/2019   LDLCALC 95 08/15/2016    Physical Findings: AIMS: Facial and Oral Movements Muscles of Facial Expression: None, normal Lips and Perioral Area: None, normal Jaw: None, normal Tongue: None, normal,Extremity Movements Upper (arms, wrists, hands, fingers): None, normal Lower (legs, knees, ankles, toes): None, normal, Trunk Movements Neck, shoulders, hips: None, normal, Overall Severity Severity of abnormal movements (highest  score from questions above): None, normal Incapacitation due to abnormal movements: None, normal Patient's awareness of abnormal movements (rate only patient's report): No Awareness, Dental Status Current problems with teeth and/or dentures?: No Does patient usually wear dentures?: No  CIWA:    COWS:     Musculoskeletal: Strength & Muscle Tone: within normal limits Gait & Station: normal Patient leans: N/A  Psychiatric  Specialty Exam: Physical Exam  Nursing note and vitals reviewed. Constitutional: He is oriented to person, place, and time.  HENT:  Head: Normocephalic and atraumatic.  Respiratory: Effort normal.  GI: Normal appearance.  Neurological: He is alert and oriented to person, place, and time.    Review of Systems  Blood pressure 103/64, pulse (!) 102, temperature 98.1 F (36.7 C), temperature source Oral, resp. rate 18, height 6\' 4"  (1.93 m), weight 101.6 kg, SpO2 100 %.Body mass index is 27.27 kg/m.  General Appearance: Casual  Eye Contact:  Fair  Speech:  Normal Rate  Volume:  Decreased  Mood:  Anxious and Dysphoric  Affect:  Congruent  Thought Process:  Coherent and Descriptions of Associations: Loose  Orientation:  Full (Time, Place, and Person)  Thought Content:  Delusions  Suicidal Thoughts:  No  Homicidal Thoughts:  No  Memory:  Immediate;   Fair Recent;   Fair Remote;   Fair  Judgement:  Intact  Insight:  Lacking  Psychomotor Activity:  Decreased  Concentration:  Concentration: Fair and Attention Span: Fair  Recall:  Good  Fund of Knowledge:  Good  Language:  Good  Akathisia:  Negative  Handed:  Right  AIMS (if indicated):     Assets:  Desire for Improvement Resilience  ADL's:  Intact  Cognition:  WNL  Sleep:  Number of Hours: 6.25     Treatment Plan Summary: Daily contact with patient to assess and evaluate symptoms and progress in treatment, Medication management and Plan : Patient is seen and examined.  Patient is a 24 year old male  with the above-stated past psychiatric history who is seen in follow-up.   Diagnosis: 1.  Schizoaffective disorder; depressive type versus schizophrenia 2.  Cannabis dependence  Pertinent findings on examination today: 1.  Decrease hallucinations and paranoia, but still delusional thinking of about "my child". 2.  Patient denied suicidal ideation this AM. 3.  Improved sleep. 4.  Patient makes better eye contact today, and more verbal.  He was able to smile and engage to a bit.  Plan: 1.  Continue Depakote DR 500 mg p.o. every 12 hours for mood stability. 2.  Continue hydroxyzine 25 mg p.o. 3 times daily as needed anxiety. 3.  Increase Seroquel to 400 mg p.o. nightly for mood stability and psychosis. 4.  Continue trazodone 100 mg p.o. nightly as needed insomnia. 5.  Continue Wellbutrin XL at 150 mg p.o. daily for mood and anxiety. 6.  Disposition planning-in progress.  Sharma Covert, MD 12/19/2019, 12:55 PM

## 2019-12-20 MED ORDER — QUETIAPINE FUMARATE 300 MG PO TABS
600.0000 mg | ORAL_TABLET | Freq: Every day | ORAL | Status: DC
  Administered 2019-12-20: 600 mg via ORAL
  Filled 2019-12-20: qty 2
  Filled 2019-12-20 (×2): qty 7
  Filled 2019-12-20: qty 2

## 2019-12-20 MED ORDER — TRAZODONE HCL 50 MG PO TABS
50.0000 mg | ORAL_TABLET | Freq: Every evening | ORAL | Status: DC | PRN
  Administered 2019-12-20: 50 mg via ORAL
  Filled 2019-12-20: qty 1
  Filled 2019-12-20: qty 7

## 2019-12-20 NOTE — Progress Notes (Signed)
[facility] MD Progress Note  12/20/2019 10:14 AM Timothy [name]  MRN:  [id number] Subjective:  Patient is a 24 year old male with past psychiatric history significant for schizophrenia who was brought to the [facility] emergency department after being found in the [place] parking lot reporting thoughts of self-harm, yelling, and being disruptive.  Objective: Patient is seen and examined.  Patient is a 24 year old male with the above-stated past psychiatric history is seen in follow-up.  Initially I observe the patient in his room, and clearly was responding to internal stimuli.  When I walked in his room to interview him he denied he is having auditory hallucinations.  His mood continues to slowly improve.  He is more interactive.  He denied suicidal or homicidal ideation.  His vital signs are stable, he is afebrile.  Nursing notes reflect that he slept 10 hours.  No new laboratories.  Principal Problem: <principal problem not specified> Diagnosis: Active Problems:   Schizoaffective disorder, bipolar type (HCC)  Total Time spent with patient: 20 minutes  Past Psychiatric History: See admission H&P  Past Medical History:  Past Medical History:  Diagnosis Date  . Cannabis abuse   . Paranoid schizophrenia (HCC)   . Schizoaffective disorder, depressive type (HCC) 09/17/2014   History reviewed. No pertinent surgical history. Family History:  Family History  Problem Relation Age of Onset  . Mental illness Brother   . Drug abuse Brother   . Mental illness Cousin   . Suicidality Cousin   . Diabetes Maternal Grandmother    Family Psychiatric  History: See admission H&P Social History:  Social History   Substance and Sexual Activity  Alcohol Use Yes  . Alcohol/week: 4.0 - 6.0 standard drinks  . Types: 4 - 6 Shots of liquor per week     Social History   Substance and Sexual Activity  Drug Use Yes  . Types: Marijuana    Social History   Socioeconomic History  .  Marital status: Single    Spouse name: Not on file  . Number of children: Not on file  . Years of education: Not on file  . Highest education level: Not on file  Occupational History  . Not on file  Tobacco Use  . Smoking status: Current Some Day Smoker    Packs/day: 0.25  . Smokeless tobacco: Never Used  Vaping Use  . Vaping Use: Never used  Substance and Sexual Activity  . Alcohol use: Yes    Alcohol/week: 4.0 - 6.0 standard drinks    Types: 4 - 6 Shots of liquor per week  . Drug use: Yes    Types: Marijuana  . Sexual activity: Yes    Birth control/protection: Condom, Rhythm  Other Topics Concern  . Not on file  Social History Narrative  . Not on file   Social Determinants of Health   Financial Resource Strain:   . Difficulty of Paying Living Expenses:   Food Insecurity:   . Worried About Programme researcher, broadcasting/film/video in the Last Year:   . Barista in the Last Year:   Transportation Needs:   . Freight forwarder (Medical):   Marland Kitchen Lack of Transportation (Non-Medical):   Physical Activity:   . Days of Exercise per Week:   . Minutes of Exercise per Session:   Stress:   . Feeling of Stress :   Social Connections:   . Frequency of Communication with Friends and Family:   . Frequency of Social Gatherings  with Friends and Family:   . Attends Religious Services:   . Active Member of Clubs or Organizations:   . Attends Archivist Meetings:   Marland Kitchen Marital Status:    Additional Social History:                         Sleep: Good  Appetite:  Good  Current Medications: Current Facility-Administered Medications  Medication Dose Route Frequency Provider Last Rate Last Admin  . acetaminophen (TYLENOL) tablet 650 mg  650 mg Oral Q6H PRN Patrecia Pour, NP      . alum & mag hydroxide-simeth (MAALOX/MYLANTA) 200-200-20 MG/5ML suspension 30 mL  30 mL Oral Q4H PRN Patrecia Pour, NP      . buPROPion (WELLBUTRIN XL) 24 hr tablet 150 mg  150 mg Oral Daily  Sharma Covert, MD   150 mg at 12/20/19 0745  . divalproex (DEPAKOTE) DR tablet 500 mg  500 mg Oral Q12H Patrecia Pour, NP   500 mg at 12/20/19 0745  . hydrOXYzine (ATARAX/VISTARIL) tablet 25 mg  25 mg Oral TID PRN Patrecia Pour, NP   25 mg at 12/19/19 2128  . magnesium hydroxide (MILK OF MAGNESIA) suspension 30 mL  30 mL Oral Daily PRN Patrecia Pour, NP      . QUEtiapine (SEROQUEL) tablet 600 mg  600 mg Oral QHS Sharma Covert, MD      . traZODone (DESYREL) tablet 100 mg  100 mg Oral QHS PRN Patrecia Pour, NP   100 mg at 12/19/19 2128    Lab Results: No results found for this or any previous visit (from the past 48 hour(s)).  Blood Alcohol level:  Lab Results  Component Value Date   ETH <10 12/15/2019   ETH <10 71/69/6789    Metabolic Disorder Labs: Lab Results  Component Value Date   HGBA1C 5.4 08/15/2016   MPG 108 08/15/2016   MPG 123 11/26/2015   Lab Results  Component Value Date   PROLACTIN 33.2 (H) 08/15/2016   Lab Results  Component Value Date   CHOL 163 11/11/2019   TRIG 81 11/11/2019   HDL 39 (L) 11/11/2019   CHOLHDL 4.2 11/11/2019   VLDL 16 11/11/2019   LDLCALC 108 (H) 11/11/2019   LDLCALC 95 08/15/2016    Physical Findings: AIMS: Facial and Oral Movements Muscles of Facial Expression: None, normal Lips and Perioral Area: None, normal Jaw: None, normal Tongue: None, normal,Extremity Movements Upper (arms, wrists, hands, fingers): None, normal Lower (legs, knees, ankles, toes): None, normal, Trunk Movements Neck, shoulders, hips: None, normal, Overall Severity Severity of abnormal movements (highest score from questions above): None, normal Incapacitation due to abnormal movements: None, normal Patient's awareness of abnormal movements (rate only patient's report): No Awareness, Dental Status Current problems with teeth and/or dentures?: No Does patient usually wear dentures?: No  CIWA:    COWS:     Musculoskeletal: Strength & Muscle  Tone: within normal limits Gait & Station: normal Patient leans: N/A  Psychiatric Specialty Exam: Physical Exam  Nursing note and vitals reviewed. Constitutional: He is oriented to person, place, and time.  HENT:  Head: Normocephalic and atraumatic.  Respiratory: Effort normal.  GI: Normal appearance.  Neurological: He is alert and oriented to person, place, and time.    Review of Systems  Blood pressure 110/77, pulse 100, temperature 98.6 F (37 C), temperature source Oral, resp. rate 18, height 6\' 4"  (1.93 m), weight 101.6  kg, SpO2 100 %.Body mass index is 27.27 kg/m.  General Appearance: Casual  Eye Contact:  Fair  Speech:  Normal Rate  Volume:  Decreased  Mood:  Dysphoric  Affect:  Congruent  Thought Process:  Coherent and Descriptions of Associations: Circumstantial  Orientation:  Full (Time, Place, and Person)  Thought Content:  Hallucinations: Auditory  Suicidal Thoughts:  No  Homicidal Thoughts:  No  Memory:  Immediate;   Fair Recent;   Fair Remote;   Fair  Judgement:  Intact  Insight:  Fair  Psychomotor Activity:  Normal  Concentration:  Concentration: Fair and Attention Span: Fair  Recall:  [employer] of Knowledge:  Fair  Language:  Good  Akathisia:  Negative  Handed:  Right  AIMS (if indicated):     Assets:  Desire for Improvement Resilience  ADL's:  Intact  Cognition:  WNL  Sleep:  Number of Hours: 10     Treatment Plan Summary: Daily contact with patient to assess and evaluate symptoms and progress in treatment, Medication management and Plan : Patient is seen and examined.  Patient is a 24 year old male with the above-stated past psychiatric history who is seen in follow-up.   Diagnosis: 1. Schizoaffective disorder; depressive type versus schizophrenia 2. Cannabis dependence  Pertinent findings on examination today: 1.  Patient observed responding to internal stimuli with auditory hallucinations this a.m., but mood and isolation has  improved. 2.  Sleep has improved. 3.  Patient denied suicidal ideation.  Plan: 1. Continue Depakote DR 500 mg p.o. every 12 hours for mood stability. 2. Continue hydroxyzine 25 mg p.o. 3 times daily as needed anxiety. 3.  Increase Seroquel to 600 mg p.o. nightly for mood stability and psychosis. 4. Continue trazodone 100 mg p.o. nightly as needed insomnia. 5.  Continue Wellbutrin XL at 150 mg p.o. daily for mood and anxiety. 6. Order Depakote level, liver function enzymes and a CBC with differential for a.m. tomorrow. 7.  Disposition planning-in progress. [name], MD 12/20/2019, 10:14 AM

## 2019-12-20 NOTE — Progress Notes (Signed)
   12/20/19 0933  Psych Admission Type (Psych Patients Only)  Admission Status Voluntary  Psychosocial Assessment  Patient Complaints None  Eye Contact Fair  Facial Expression Animated  Affect Anxious  Speech Logical/coherent  Interaction Assertive  Motor Activity Other (Comment) (WDL)  Appearance/Hygiene Unremarkable  Behavior Characteristics Cooperative  Mood Anxious ("I'm fine, I just want to go home")  Aggressive Behavior  Effect No apparent injury  Thought Process  Coherency WDL  Content Paranoia  Delusions Paranoid  Perception WDL (Denies AVH when assessed.)  Hallucination None reported or observed  Judgment Limited  Confusion None  Danger to Self  Current suicidal ideation? Denies  Agreement Not to Harm Self Yes  Description of Agreement Pt verbally contracts for safety  Danger to Others  Danger to Others None reported or observed   Pt A & O X 3. Presents animated with lucid conversations at intervals during shift. Attended scheduled groups and remains medication compliant. Showered earlier this shift. Denies SI, HI, AVH and pain when assessed "I'm fine, I just want to go home today, I thought the doctor say he will let me go". Pt tolerated all medications and meals well. Emotional support and verbal encouragement offered to pt throughout this shift. All medications administered as scheduled with verbal education and effects monitored. D/C procedure reviewed with pt. Q 15 minutes safety checks maintained without incident thus far.  Pt verbalized understanding related to d/c procedure. Remains cooperative with unit routines and care. Safety maintained on and off unit. Denies further concerns at this time.

## 2019-12-20 NOTE — Progress Notes (Signed)
Recreation Therapy Notes  Date: 6.17.21 Time: 1000 Location: 500 Hall Day Room   Group Topic: Leisure Education  Goal Area(s) Addresses:  Patient will identify positive leisure activities.  Patient will identify one positive benefit of participation in leisure activities.   Behavioral Response: Engaged  Intervention: Leisure [employer], Music  Activity: Pictionary.  LRT and patients engaged in an activity where the person had to pick a word from the container and draw it on the board.  Each person got one minute and the person who guessed the picture correctly, got the next turn. When the game was finished, LRT allowed patients to request a song they wanted to hear and played it for them as long as it was clean.  Education:  Leisure Education, Building control surveyor  Education Outcome: Acknowledges education/In group clarification offered/Needs additional education  Clinical Observations/Feedback: Pt was active and engaged during the activity.  Pt gave good effort when drawing and trying to guess the pictures others drew.  Pt sat quietly for most of the time while the music was playing.  Pt appeared to be fully in tuned to each song that played.    Caroll Rancher, LRT/CTRS         Caroll Rancher A 12/20/2019 11:34 AM

## 2019-12-20 NOTE — Progress Notes (Signed)
   12/20/19 2100  Psych Admission Type (Psych Patients Only)  Admission Status Voluntary  Psychosocial Assessment  Patient Complaints None  Eye Contact Fair  Facial Expression Animated  Affect Anxious  Speech Logical/coherent  Interaction Assertive  Motor Activity Other (Comment) (WDL)  Appearance/Hygiene Unremarkable  Behavior Characteristics Cooperative  Mood Pleasant  Aggressive Behavior  Effect No apparent injury  Thought Process  Coherency WDL  Content Paranoia  Delusions Paranoid  Perception WDL (Denies AVH when assessed.)  Hallucination None reported or observed  Judgment Limited  Confusion None  Danger to Self  Current suicidal ideation? Denies  Agreement Not to Harm Self Yes  Description of Agreement Pt verbally contracts for safety  Danger to Others  Danger to Others None reported or observed

## 2019-12-21 LAB — CBC WITH DIFFERENTIAL/PLATELET
Abs Immature Granulocytes: 0.06 10*3/uL (ref 0.00–0.07)
Basophils Absolute: 0 10*3/uL (ref 0.0–0.1)
Basophils Relative: 1 %
Eosinophils Absolute: 0.1 10*3/uL (ref 0.0–0.5)
Eosinophils Relative: 2 %
HCT: 51.4 % (ref 39.0–52.0)
Hemoglobin: 16.5 g/dL (ref 13.0–17.0)
Immature Granulocytes: 1 %
Lymphocytes Relative: 37 %
Lymphs Abs: 1.9 10*3/uL (ref 0.7–4.0)
MCH: 30.7 pg (ref 26.0–34.0)
MCHC: 32.1 g/dL (ref 30.0–36.0)
MCV: 95.7 fL (ref 80.0–100.0)
Monocytes Absolute: 0.7 10*3/uL (ref 0.1–1.0)
Monocytes Relative: 14 %
Neutro Abs: 2.3 10*3/uL (ref 1.7–7.7)
Neutrophils Relative %: 45 %
Platelets: 189 10*3/uL (ref 150–400)
RBC: 5.37 MIL/uL (ref 4.22–5.81)
RDW: 13.2 % (ref 11.5–15.5)
WBC: 5.1 10*3/uL (ref 4.0–10.5)
nRBC: 0 % (ref 0.0–0.2)

## 2019-12-21 LAB — VALPROIC ACID LEVEL: Valproic Acid Lvl: 84 ug/mL (ref 50.0–100.0)

## 2019-12-21 LAB — HEPATIC FUNCTION PANEL
ALT: 19 U/L (ref 0–44)
AST: 19 U/L (ref 15–41)
Albumin: 4 g/dL (ref 3.5–5.0)
Alkaline Phosphatase: 41 U/L (ref 38–126)
Bilirubin, Direct: <0.1 mg/dL (ref 0.0–0.2)
Total Bilirubin: 0.8 mg/dL (ref 0.3–1.2)
Total Protein: 7.4 g/dL (ref 6.5–8.1)

## 2019-12-21 MED ORDER — BUPROPION HCL ER (XL) 150 MG PO TB24: 150.0000 mg | ORAL_TABLET | Freq: Every day | ORAL | 0 refills | Status: DC

## 2019-12-21 MED ORDER — QUETIAPINE FUMARATE 300 MG PO TABS: 600.0000 mg | ORAL_TABLET | Freq: Every day | ORAL | 0 refills | Status: DC

## 2019-12-21 MED ORDER — QUETIAPINE FUMARATE 300 MG PO TABS: 600.0000 mg | ORAL_TABLET | Freq: Every day | ORAL | Status: DC

## 2019-12-21 MED ORDER — TRAZODONE HCL 50 MG PO TABS: 50.0000 mg | ORAL_TABLET | Freq: Every evening | ORAL | 0 refills | Status: DC | PRN

## 2019-12-21 MED ORDER — HYDROXYZINE HCL 25 MG PO TABS: 25.0000 mg | ORAL_TABLET | Freq: Three times a day (TID) | ORAL | 0 refills | Status: DC | PRN

## 2019-12-21 MED ORDER — DIVALPROEX SODIUM 500 MG PO DR TAB: 500.0000 mg | DELAYED_RELEASE_TABLET | Freq: Two times a day (BID) | ORAL | 0 refills | Status: DC

## 2019-12-21 NOTE — Progress Notes (Signed)
Pt discharged to lobby. Pt was stable and appreciative at that time. All papers, samples and prescriptions were given and valuables returned. Verbal understanding expressed. Denies SI/HI and A/VH. Pt given opportunity to express concerns and ask questions.  

## 2019-12-21 NOTE — Progress Notes (Signed)
  [facility] Adult Case Management Discharge Plan :  Will you be returning to the same living situation after discharge:  Yes,  to home. At discharge, do you have transportation home?: No. Safe Transport will be arranged. Do you have the ability to pay for your medications: No. Samples will be provided at discharge.   Release of information consent forms completed and in the chart;  Patient's signature needed at discharge.  Patient to Follow up at:  Follow-up Information    [facility]. Go on 12/31/2019.   Specialty: Behavioral Health Why: You are scheduled for an appointment for therapy on 12/31/19 at 10:00 am, in person.  Please arrive at 9:30 am to complete paperwork.  You also have an appointment for medication management on 01/22/20 at 9:00 am. Contact information: [phone] [place] [address] [phone]              Next level of care provider has access to [facility] Link:yes  Safety Planning and Suicide Prevention discussed: Yes,  with grandmother.      Has patient been referred to the Quitline?: N/A patient is not a smoker  Patient has been referred for addiction treatment: N/A  Otelia Santee, LCSWA 12/21/2019, 9:12 AM

## 2019-12-21 NOTE — Progress Notes (Signed)
Recreation Therapy Notes  INPATIENT RECREATION TR PLAN  Patient Details Name: Timothy [name] MRN: [id number] DOB: 09/01/1995 Today's Date: 12/21/2019  Rec Therapy Plan Is patient appropriate for Therapeutic Recreation?: Yes Treatment times per week: about 3 days Estimated Length of Stay: 5-7 days TR Treatment/Interventions: Group participation (Comment)  Discharge Criteria Pt will be discharged from therapy if:: Discharged Treatment plan/goals/alternatives discussed and agreed upon by:: Patient/family  Discharge Summary Short term goals set: See patient care plan Short term goals met: Complete Progress toward goals comments: Groups attended Which groups?: Leisure education, Other (Comment) (Triggers) Reason goals not met: None Therapeutic equipment acquired: N/A Reason patient discharged from therapy: Discharge from hospital Pt/family agrees with progress & goals achieved: Yes Date patient discharged from therapy: 12/21/19     Victorino Sparrow, LRT/CTRS  Ria Comment, Kimoni Pagliarulo A 12/21/2019, 12:00 PM

## 2019-12-21 NOTE — [facility] Suicide Risk Assessment (Signed)
[facility] Discharge Suicide Risk Assessment   Principal Problem: <principal problem not specified> Discharge Diagnoses: Active Problems:   Schizoaffective disorder, bipolar type (HCC)   Total Time spent with patient: 15 minutes  Musculoskeletal: Strength & Muscle Tone: within normal limits Gait & Station: normal Patient leans: N/A  Psychiatric Specialty Exam: Review of Systems  All other systems reviewed and are negative.   Blood pressure (!) 126/55, pulse (!) 101, temperature 98.2 F (36.8 C), temperature source Oral, resp. rate 18, height 6\' 4"  (1.93 m), weight 101.6 kg, SpO2 100 %.Body mass index is 27.27 kg/m.  General Appearance: Casual  Eye Contact::  Fair  Speech:  Normal Rate409  Volume:  Normal  Mood:  Euthymic  Affect:  Congruent  Thought Process:  Coherent and Descriptions of Associations: Intact  Orientation:  Full (Time, Place, and Person)  Thought Content:  Logical  Suicidal Thoughts:  No  Homicidal Thoughts:  No  Memory:  Immediate;   Fair Recent;   Fair Remote;   Fair  Judgement:  Intact  Insight:  Fair  Psychomotor Activity:  Normal  Concentration:  Fair  Recall:  [phone] of Knowledge:Fair  Language: Good  Akathisia:  Negative  Handed:  Right  AIMS (if indicated):     Assets:  Desire for Improvement Housing Resilience Social Support  Sleep:  Number of Hours: 9  Cognition: WNL  ADL's:  Intact   Mental Status Per Nursing Assessment::   On Admission:  Suicidal ideation indicated by patient, Plan includes specific time, place, or method, Intention to act on suicide plan, Self-harm thoughts, Belief that plan would result in death, Suicide plan  Demographic Factors:  Male and Low socioeconomic status  Loss Factors: Financial problems/change in socioeconomic status  Historical Factors: Impulsivity  Risk Reduction Factors:   Living with another person, especially a relative and Positive social support  Continued Clinical Symptoms:   Schizophrenia:   Less than 29 years old Paranoid or undifferentiated type  Cognitive Features That Contribute To Risk:  None    Suicide Risk:  Minimal: No identifiable suicidal ideation.  Patients presenting with no risk factors but with morbid ruminations; may be classified as minimal risk based on the severity of the depressive symptoms   Follow-up Information    [facility]. Go on 12/31/2019.   Specialty: Behavioral Health Why: You are scheduled for an appointment for therapy on 12/31/19 at 10:00 am, in person.  Please arrive at 9:30 am to complete paperwork.  You also have an appointment for medication management on 01/22/20 at 9:00 am. Contact information: [address] [place] [phone]              Plan Of Care/Follow-up recommendations:  Activity:  ad lib  [phone], MD 12/21/2019, 8:00 AM

## 2019-12-21 NOTE — Discharge Summary (Signed)
Physician Discharge Summary Note  Patient:  Timothy [name] is an 24 y.o., male  MRN:  [id number]  DOB:  1995-10-17  Patient phone:  [phone] (home)   Patient address:   [address] [place] [address],   Total Time spent with patient: Greater than 30 minutes  Date of Admission:  12/16/2019  Date of Discharge: 12-21-19  Reason for Admission: Timothy [name] parking lot reporting thoughts of self-harm after he was told he is "going to be a father".   Principal Problem: Schizoaffective disorder, bipolar type [facility])  Discharge Diagnoses: Principal Problem:   Schizoaffective disorder, bipolar type (HCC)  Past Psychiatric History: Schizoaffective disorder, Bipolar-type.  Past Medical History:  Past Medical History:  Diagnosis Date  . Cannabis abuse   . Paranoid schizophrenia (HCC)   . Schizoaffective disorder, depressive type (HCC) 09/17/2014   History reviewed. No pertinent surgical history. Family History:  Family History  Problem Relation Age of Onset  . Mental illness Brother   . Drug abuse Brother   . Mental illness Cousin   . Suicidality Cousin   . Diabetes Maternal Grandmother    Family Psychiatric  History: See admission H&P  Social History:  Social History   Substance and Sexual Activity  Alcohol Use Yes  . Alcohol/week: 4.0 - 6.0 standard drinks  . Types: 4 - 6 Shots of liquor per week     Social History   Substance and Sexual Activity  Drug Use Yes  . Types: Marijuana    Social History   Socioeconomic History  . Marital status: Single    Spouse name: Not on file  . Number of children: Not on file  . Years of education: Not on file  . Highest education level: Not on file  Occupational History  . Not on file  Tobacco Use  . Smoking status: Current Some Day Smoker    Packs/day: 0.25  . Smokeless tobacco: Never Used  Vaping Use  . Vaping Use: Never used  Substance and Sexual Activity  . Alcohol use: Yes    Alcohol/week: 4.0 - 6.0  standard drinks    Types: 4 - 6 Shots of liquor per week  . Drug use: Yes    Types: Marijuana  . Sexual activity: Yes    Birth control/protection: Condom, Rhythm  Other Topics Concern  . Not on file  Social History Narrative  . Not on file   Social Determinants of Health   Financial Resource Strain:   . Difficulty of Paying Living Expenses:   Food Insecurity:   . Worried About Programme researcher, broadcasting/film/video in the Last Year:   . Barista in the Last Year:   Transportation Needs:   . Freight forwarder (Medical):   Marland Kitchen Lack of Transportation (Non-Medical):   Physical Activity:   . Days of Exercise per Week:   . Minutes of Exercise per Session:   Stress:   . Feeling of Stress :   Social Connections:   . Frequency of Communication with Friends and Family:   . Frequency of Social Gatherings with Friends and Family:   . Attends Religious Services:   . Active Member of Clubs or Organizations:   . Attends Banker Meetings:   Marland Kitchen Marital Status:    Hospital Course: (Per Md's admission evaluation notes): Patient is a 24 year old male with a past psychiatric history significant for schizophrenia who was originally brought to the [facility] emergency department after being found in  the Timothy [name] parking lot reporting thoughts of self-harm. Somehow or another this had something to do with him being told that he was "going to be a father". He discussed that during the interview today, but it sounds more delusional if anything. He was brought by emergency medical services to the [facility] emergency department. He did not want to speak to anyone and would only nod yes or no. He had been discharged from the [facility] inpatient psychiatric service in May 2021. He was admitted there on 11/10/2019. His diagnosis was schizoaffective disorder. At that time he had been off his medications for approximately 2 months. He  admitted to auditory hallucinations at that time. He stated that his last medications included Seroquel, Wellbutrin, Depakote, trazodone, Vistaril. He was actually discharged by me on 11/16/2019. He was discharged on Wellbutrin, Depakote, hydroxyzine, Seroquel and trazodone. He still is quite delusional. He also seems to have thought blocking at this time. He was admitted to the hospital for evaluation and stabilization. In treatment team he did mention that he has been smoking marijuana on a daily basis, and "I probably will continue".  This is one of several psychiatric discharge summaries from [facility] for this 24 year old AA male with hx of chronic mental illness, THC use disorder & multiple psychiatric admissions. He is known in this [facility] for worsening symptoms of Schizoaffective disorder, bipolar-type. He has been tried on multiple psychotropic medications for his symptoms & it appeared his symptoms has not been able to improve & yet, Timothy [name] is known to be non-compliant to his treatment regimen. He was brought to the [facility] this time around for evaluation & treatment after he was found at the [employer] parking lot reporting thoughts of self-harm. Somehow or another this had something to do with him being told that he is "going to be a father".   After evaluation of his presenting symptoms, Timothy [name] was recommended for mood stabilization treatments. The medication regimen for his presenting symptoms were discussed & with his consent initiated. He received, stabilized & was discharged on the medications as listed below on his discharge medication lists. He was also enrolled & participated in the group counseling sessions being offered & held on this unit. He learned coping skills. He presented on this admission, no other chronic medical conditions that required treatment & monitoring. He tolerated his treatment regimen without any adverse effects or reactions reported.  Timothy [name]'s symptoms responded well to his  treatment regimen. This is evidenced by his reports of improved symptoms & absence of suicidal ideations. He is currently mentally & medically stable to be discharged to continue mental health care & medication management on an outpatient basis. During the course of his hospitalization, the 15-minute checks were adequate to ensure [place]'s safety.  Patient did not display any dangerous, violent or suicidal behavior on the unit. He interacted with patients & staff appropriately. He participated appropriately in the group sessions/therapies. His medications were addressed & adjusted to meet his needs. He was recommended for outpatient follow-up care & medication management upon discharge to assure his continuity of care.  At the time of discharge, patient is not reporting any acute suicidal/homicidal ideations. He feels more confident about his self-care & in managing his symptoms. He currently denies any new issues or concerns. Education and supportive counseling provided throughout her hospital stay & upon discharge.  Today upon his discharge evaluation with the attending psychiatrist, Casmer shares he is doing well. He denies any  other specific concerns. He is sleeping well. His appetite is good. He denies other physical complaints. He denies AH/VH. He feels that his medications have been helpful & is in agreement to continue his current treatment regimen as recommended. He was able to engage in safety planning including [facility] or contact emergency services if he feels unable to maintain his own safety or the safety of others. Pt had no further questions, comments, or concerns. [facility] personal belongings in no apparent distress. Transportation per the safe [employer]..  Physical Findings: AIMS: Facial and Oral Movements Muscles of Facial Expression: None, normal Lips and Perioral Area: None, normal Jaw: None, normal Tongue: None, normal,Extremity Movements Upper (arms,  wrists, hands, fingers): None, normal Lower (legs, knees, ankles, toes): None, normal, Trunk Movements Neck, shoulders, hips: None, normal, Overall Severity Severity of abnormal movements (highest score from questions above): None, normal Incapacitation due to abnormal movements: None, normal Patient's awareness of abnormal movements (rate only patient's report): No Awareness, Dental Status Current problems with teeth and/or dentures?: No Does patient usually wear dentures?: No  CIWA:    COWS:     Musculoskeletal: Strength & Muscle Tone: within normal limits Gait & Station: normal Patient leans: N/A  Psychiatric Specialty Exam: Physical Exam  Nursing note and vitals reviewed. Constitutional: He is oriented to person, place, and time. He appears well-developed.  HENT:  Head: Normocephalic and atraumatic.  Nose: Nose normal.  Mouth/Throat: Oropharynx is clear.  Eyes: Pupils are equal, round, and reactive to light.  Cardiovascular: Normal rate and normal pulses.  Respiratory: Effort normal.  Genitourinary:    Genitourinary Comments: Deferred   Musculoskeletal:        General: Normal range of motion.     Cervical back: Normal range of motion.  Neurological: He is alert and oriented to person, place, and time.  Skin: Skin is warm and dry.    Review of Systems  Constitutional: Negative for chills, diaphoresis and fever.  HENT: Negative for congestion, rhinorrhea, sneezing and sore throat.   Eyes: Negative for discharge.  Respiratory: Negative for cough, chest tightness, shortness of breath and wheezing.   Cardiovascular: Negative for chest pain and palpitations.  Gastrointestinal: Negative for diarrhea, nausea and vomiting.  Endocrine: Negative for cold intolerance.  Genitourinary: Negative for difficulty urinating.  Musculoskeletal: Negative for arthralgias and myalgias.  Allergic/Immunologic: Negative for environmental allergies and food allergies.       Allergies: Haldol,  Abilify, PCN, Zyprexa  Neurological: Negative for dizziness, tremors, seizures, syncope, speech difficulty, weakness, light-headedness, numbness and headaches.  Psychiatric/Behavioral: Positive for dysphoric mood (Stabilized with medication prior to discharge), hallucinations (Hx. of (Stabilized with medication prior to discharge)) and sleep disturbance (Stabilized with medication prior to discharge). Negative for agitation, behavioral problems, confusion, decreased concentration, self-injury and suicidal ideas. The patient is not nervous/anxious (Stable) and is not hyperactive.     Blood pressure (!) 126/55, pulse (!) 101, temperature 98.2 F (36.8 C), temperature source Oral, resp. rate 18, height 6\' 4"  (1.93 m), weight 101.6 kg, SpO2 100 %.Body mass index is 27.27 kg/m.  See Md's discharge SRA  Sleep:  Number of Hours: 9   Has this patient used any form of tobacco in the last 30 days? (Cigarettes, Smokeless Tobacco, Cigars, and/or Pipes): N/A  Blood Alcohol level:  Lab Results  Component Value Date   [facility] 12/15/2019   ETH <10 11/09/2019   Metabolic Disorder Labs:  Lab Results  Component Value Date  HGBA1C 5.4 08/15/2016   MPG 108 08/15/2016   MPG 123 11/26/2015   Lab Results  Component Value Date   PROLACTIN 33.2 (H) 08/15/2016   Lab Results  Component Value Date   CHOL 163 11/11/2019   TRIG 81 11/11/2019   HDL 39 (L) 11/11/2019   CHOLHDL 4.2 11/11/2019   VLDL 16 11/11/2019   LDLCALC 108 (H) 11/11/2019   LDLCALC 95 08/15/2016   See Psychiatric Specialty Exam and Suicide Risk Assessment completed by Attending Physician prior to discharge.  Discharge destination:  Home  Is patient on multiple antipsychotic therapies at discharge:  No   Has Patient had three or more failed trials of antipsychotic monotherapy by history:  No  Recommended Plan for Multiple Antipsychotic Therapies: NA  Allergies as of 12/21/2019      Reactions   Haldol [haloperidol Lactate] Other  (See Comments)   Muscle stiffness   Abilify [aripiprazole]    eps   Penicillins Hives   Felt like throat was closing Has patient had a PCN reaction causing immediate rash, facial/tongue/throat swelling, SOB or lightheadedness with hypotension: Unknown Has patient had a PCN reaction causing severe rash involving mucus membranes or skin necrosis: unknown Has patient had a PCN reaction that required hospitalization Unknown Has patient had a PCN reaction occurring within the last 10 years: Unknown If all of the above answers are "NO", then may proceed with Cephalosporin use.   Zyprexa [olanzapine]    eps      Medication List    STOP taking these medications   acetaminophen 500 MG tablet Commonly known as: TYLENOL   buPROPion 150 MG 12 hr tablet Commonly known as: WELLBUTRIN SR Replaced by: buPROPion 150 MG 24 hr tablet     TAKE these medications     Indication  buPROPion 150 MG 24 hr tablet Commonly known as: WELLBUTRIN XL Take 1 tablet (150 mg total) by mouth daily. For depression Start taking on: December 22, 2019 Replaces: buPROPion 150 MG 12 hr tablet  Indication: Major Depressive Disorder   divalproex 500 MG DR tablet Commonly known as: DEPAKOTE Take 1 tablet (500 mg total) by mouth every 12 (twelve) hours. For mood stabilization What changed:   medication strength  how much to take  when to take this  additional instructions  Indication: Mood stabilization   hydrOXYzine 25 MG tablet Commonly known as: ATARAX/VISTARIL Take 1 tablet (25 mg total) by mouth 3 (three) times daily as needed for anxiety.  Indication: Feeling Anxious   QUEtiapine 300 MG tablet Commonly known as: SEROQUEL Take 2 tablets (600 mg total) by mouth at bedtime. For mood control What changed:   how much to take  additional instructions  Indication: Mood control   traZODone 50 MG tablet Commonly known as: DESYREL Take 1 tablet (50 mg total) by mouth at bedtime as needed for sleep. What  changed:   medication strength  how much to take  Indication: Trouble Sleeping       Follow-up Information    [facility] [place]. Go on 12/31/2019.   Specialty: Behavioral Health Why: You are scheduled for an appointment for therapy on 12/31/19 at 10:00 am, in person.  Please arrive at 9:30 am to complete paperwork.  You also have an appointment for medication management on 01/22/20 at 9:00 am. Contact information: [address] [place] [address] [phone]             Follow-up recommendations: Activity:  As tolerated Diet: As recommended  by your primary care doctor. Keep all scheduled follow-up appointments as recommended.   Comments: Prescriptions given at discharge.  Patient agreeable to plan.  Given opportunity to ask questions.  Appears to feel comfortable with discharge denies any current suicidal or homicidal thought. Patient is also instructed prior to discharge to: Take all medications as prescribed by his/her mental healthcare provider. Report any adverse effects and or reactions from the medicines to his/her outpatient provider promptly. Patient has been instructed & cautioned: To not engage in alcohol and or illegal drug use while on prescription medicines. In the event of worsening symptoms, patient is instructed to call the crisis hotline, 911 and or go to the nearest ED for appropriate evaluation and treatment of symptoms. To follow-up with his/her primary care provider for your other medical issues, concerns and or health care needs.  Signed: Armandina Stammer, NP, PMHNP, FNP-BC 12/21/2019, 11:01 AM

## 2019-12-21 NOTE — Plan of Care (Signed)
Pt was able to identify triggers at completion of recreation therapy group sessions.   Stormey Wilborn, LRT/CTRS 

## 2019-12-21 NOTE — Tx Team (Signed)
Interdisciplinary Treatment and Diagnostic Plan Update  12/21/2019 Time of Session: 9:00am Timothy [name] MRN: [id number]  Principal Diagnosis: <principal problem not specified>  Secondary Diagnoses: Active Problems:   Schizoaffective disorder, bipolar type (HCC)   Current Medications:  Current Facility-Administered Medications  Medication Dose Route Frequency Provider Last Rate Last Admin  . acetaminophen (TYLENOL) tablet 650 mg  650 mg Oral Q6H PRN Charm Rings, NP      . alum & mag hydroxide-simeth (MAALOX/MYLANTA) 200-200-20 MG/5ML suspension 30 mL  30 mL Oral Q4H PRN Charm Rings, NP      . buPROPion (WELLBUTRIN XL) 24 hr tablet 150 mg  150 mg Oral Daily [name], MD   150 mg at 12/21/19 0756  . divalproex (DEPAKOTE) DR tablet 500 mg  500 mg Oral Q12H Charm Rings, NP   500 mg at 12/21/19 0756  . hydrOXYzine (ATARAX/VISTARIL) tablet 25 mg  25 mg Oral TID PRN Charm Rings, NP   25 mg at 12/20/19 2100  . magnesium hydroxide (MILK OF MAGNESIA) suspension 30 mL  30 mL Oral Daily PRN Charm Rings, NP      . QUEtiapine (SEROQUEL) tablet 600 mg  600 mg Oral QHS [name], MD   600 mg at 12/20/19 2100  . traZODone (DESYREL) tablet 50 mg  50 mg Oral QHS PRN [name], MD   50 mg at 12/20/19 2100   PTA Medications: Medications Prior to Admission  Medication Sig Dispense Refill Last Dose  . acetaminophen (TYLENOL) 500 MG tablet Take 1,000 mg by mouth every 6 (six) hours as needed for mild pain or moderate pain.     Marland Kitchen buPROPion (WELLBUTRIN SR) 150 MG 12 hr tablet Take 1 tablet (150 mg total) by mouth 2 (two) times daily. (Patient not taking: Reported on 12/15/2019) 60 tablet 0   . divalproex (DEPAKOTE) 250 MG DR tablet Take 3 tablets (750 mg total) by mouth every evening. (Patient not taking: Reported on 12/15/2019) 90 tablet 0   . hydrOXYzine (ATARAX/VISTARIL) 25 MG tablet Take 1 tablet (25 mg total) by mouth 3 (three) times daily as needed for anxiety.  (Patient not taking: Reported on 12/15/2019) 30 tablet 0   . QUEtiapine (SEROQUEL) 300 MG tablet Take 1 tablet (300 mg total) by mouth at bedtime. (Patient not taking: Reported on 12/15/2019) 30 tablet 0   . traZODone (DESYREL) 100 MG tablet Take 1 tablet (100 mg total) by mouth at bedtime as needed for sleep. (Patient not taking: Reported on 12/15/2019) 30 tablet 0     Patient Stressors: Financial difficulties Marital or family conflict Substance abuse  Patient Strengths: Ability for insight Active sense of humor Average or above average intelligence Motivation for treatment/growth Physical Health Special hobby/interest Supportive family/friends  Treatment Modalities: Medication Management, Group therapy, Case management,  1 to 1 session with clinician, Psychoeducation, Recreational therapy.   Physician Treatment Plan for Primary Diagnosis: <principal problem not specified> Long Term Goal(s): Improvement in symptoms so as ready for discharge Improvement in symptoms so as ready for discharge   Short Term Goals: Ability to identify changes in lifestyle to reduce recurrence of condition will improve Ability to verbalize feelings will improve Ability to demonstrate self-control will improve Ability to identify and develop effective coping behaviors will improve Ability to maintain clinical measurements within normal limits will improve Compliance with prescribed medications will improve Ability to identify triggers associated with substance abuse/mental health issues will improve Ability to identify changes in  lifestyle to reduce recurrence of condition will improve Ability to verbalize feelings will improve Ability to demonstrate self-control will improve Ability to identify and develop effective coping behaviors will improve Ability to maintain clinical measurements within normal limits will improve Compliance with prescribed medications will improve Ability to identify triggers  associated with substance abuse/mental health issues will improve  Medication Management: Evaluate patient's response, side effects, and tolerance of medication regimen.  Therapeutic Interventions: 1 to 1 sessions, Unit Group sessions and Medication administration.  Evaluation of Outcomes: Adequate for Discharge  Physician Treatment Plan for Secondary Diagnosis: Active Problems:   Schizoaffective disorder, bipolar type ([place])  Long Term Goal(s): Improvement in symptoms so as ready for discharge Improvement in symptoms so as ready for discharge   Short Term Goals: Ability to identify changes in lifestyle to reduce recurrence of condition will improve Ability to verbalize feelings will improve Ability to demonstrate self-control will improve Ability to identify and develop effective coping behaviors will improve Ability to maintain clinical measurements within normal limits will improve Compliance with prescribed medications will improve Ability to identify triggers associated with substance abuse/mental health issues will improve Ability to identify changes in lifestyle to reduce recurrence of condition will improve Ability to verbalize feelings will improve Ability to demonstrate self-control will improve Ability to identify and develop effective coping behaviors will improve Ability to maintain clinical measurements within normal limits will improve Compliance with prescribed medications will improve Ability to identify triggers associated with substance abuse/mental health issues will improve     Medication Management: Evaluate patient's response, side effects, and tolerance of medication regimen.  Therapeutic Interventions: 1 to 1 sessions, Unit Group sessions and Medication administration.  Evaluation of Outcomes: Adequate for Discharge   RN Treatment Plan for Primary Diagnosis: <principal problem not specified> Long Term Goal(s): Knowledge of disease and therapeutic regimen  to maintain health will improve  Short Term Goals: Ability to identify and develop effective coping behaviors will improve  Medication Management: RN will administer medications as ordered by provider, will assess and evaluate patient's response and provide education to patient for prescribed medication. RN will report any adverse and/or side effects to prescribing provider.  Therapeutic Interventions: 1 on 1 counseling sessions, Psychoeducation, Medication administration, Evaluate responses to treatment, Monitor vital signs and CBGs as ordered, Perform/monitor CIWA, COWS, AIMS and Fall Risk screenings as ordered, Perform wound care treatments as ordered.  Evaluation of Outcomes: Adequate for Discharge   LCSW Treatment Plan for Primary Diagnosis: <principal problem not specified> Long Term Goal(s): Safe transition to appropriate next level of care at discharge, Engage patient in therapeutic group addressing interpersonal concerns.  Short Term Goals: Engage patient in aftercare planning with referrals and resources, Increase social support, Identify triggers associated with mental health/substance abuse issues and Increase skills for wellness and recovery  Therapeutic Interventions: Assess for all discharge needs, 1 to 1 time with Social worker, Explore available resources and support systems, Assess for adequacy in community support network, Educate family and significant other(s) on suicide prevention, Complete Psychosocial Assessment, Interpersonal group therapy.  Evaluation of Outcomes: Adequate for Discharge  Progress in Treatment: Attending groups: Yes.  Participating in groups: Yes. Taking medication as prescribed: Yes. Toleration medication: Yes. Family/Significant other contact made: Yes, individual(s) contacted:  grandmother Patient understands diagnosis: Yes. Discussing patient identified problems/goals with staff: Yes. Medical problems stabilized or resolved: Yes. Denies  suicidal/homicidal ideation: Yes.  New problem(s) identified: No, Describe:  CSW assessing  New Short Term/Long Term Goal(s): medication management for mood  stabilization; elimination of SI thoughts; development of comprehensive mental wellness/sobriety plan.  Patient Goals:  "Take my meds."  Discharge Plan or Barriers: Following up with [facility] for medication management and therapy  Reason for Continuation of Hospitalization: Medication stabilization  Estimated Length of Stay: discharging today  Attendees: Patient: Timothy [name] 12/21/2019 9:41 AM  Physician: Dr. Jola Babinski 12/21/2019 9:41 AM  Nursing:  12/21/2019 9:41 AM  RN Care Manager: 12/21/2019 9:41 AM  Social Worker: Enid Cutter, LCSWA 12/21/2019 9:41 AM  Recreational Therapist:  12/21/2019 9:41 AM  Other:  12/21/2019 9:41 AM  Other:  12/21/2019 9:41 AM  Other: 12/21/2019 9:41 AM    Scribe for Treatment Team: Darreld Mclean, LCSWA 12/21/2019 9:41 AM

## 2019-12-21 NOTE — Progress Notes (Signed)
SPIRITUALITY GROUP NOTE  Pt attended spirituality group facilitated by Wilkie Aye, MDIv, BCC.  Group Description:  Group focused on topic of hope.  Patients participated in facilitated discussion around topic, connecting with one another around experiences and definitions for hope.  Group members engaged with visual explorer photos, reflecting on what hope looks like for them today.  Group engaged in discussion around how their definitions of hope are present today in hospital.   Modalities: Psycho-social ed, Adlerian, Narrative, MI  Patient Progress: Pt invited to group.  DID NOT ATTEND

## 2019-12-24 ENCOUNTER — Other Ambulatory Visit: Payer: Self-pay

## 2019-12-24 ENCOUNTER — Encounter: Payer: Self-pay | Admitting: *Deleted

## 2019-12-24 DIAGNOSIS — Z20822 Contact with and (suspected) exposure to covid-19: Secondary | ICD-10-CM | POA: Insufficient documentation

## 2019-12-24 DIAGNOSIS — F17210 Nicotine dependence, cigarettes, uncomplicated: Secondary | ICD-10-CM | POA: Insufficient documentation

## 2019-12-24 DIAGNOSIS — Z79899 Other long term (current) drug therapy: Secondary | ICD-10-CM | POA: Insufficient documentation

## 2019-12-24 DIAGNOSIS — F259 Schizoaffective disorder, unspecified: Secondary | ICD-10-CM | POA: Insufficient documentation

## 2019-12-24 NOTE — ED Triage Notes (Addendum)
Pt to ED reporting he is hearing sounds in his apartment and states his neighbors told him recently someone died in the apartment and pt now feels he is hearing this fall off the walls. Pt visual hallucinations. PT was recently admitted to this hospital for behavioral health but when asked he doesn't remember why he was admitted. No drinking or drug use. Difficulty sleeping intermittently. Pt fixated on a ghost being in his hose and continuously brings it back into the conversation.   PT calm and cooperative in triage.

## 2019-12-25 ENCOUNTER — Emergency Department
Admission: EM | Admit: 2019-12-25 | Discharge: 2019-12-25 | Disposition: A | Discharge status: Home / Self Care | Payer: Medicaid Other | Attending: Emergency Medicine | Admitting: Emergency Medicine

## 2019-12-25 DIAGNOSIS — F250 Schizoaffective disorder, bipolar type: Secondary | ICD-10-CM | POA: Diagnosis present

## 2019-12-25 DIAGNOSIS — F251 Schizoaffective disorder, depressive type: Secondary | ICD-10-CM | POA: Diagnosis present

## 2019-12-25 DIAGNOSIS — G2111 Neuroleptic induced parkinsonism: Secondary | ICD-10-CM | POA: Diagnosis present

## 2019-12-25 DIAGNOSIS — F209 Schizophrenia, unspecified: Secondary | ICD-10-CM

## 2019-12-25 DIAGNOSIS — F1220 Cannabis dependence, uncomplicated: Secondary | ICD-10-CM | POA: Diagnosis present

## 2019-12-25 DIAGNOSIS — F1310 Sedative, hypnotic or anxiolytic abuse, uncomplicated: Secondary | ICD-10-CM | POA: Diagnosis present

## 2019-12-25 LAB — CBC
HCT: 44.8 % (ref 39.0–52.0)
Hemoglobin: 14.8 g/dL (ref 13.0–17.0)
MCH: 30.3 pg (ref 26.0–34.0)
MCHC: 33 g/dL (ref 30.0–36.0)
MCV: 91.8 fL (ref 80.0–100.0)
Platelets: 198 10*3/uL (ref 150–400)
RBC: 4.88 MIL/uL (ref 4.22–5.81)
RDW: 13.3 % (ref 11.5–15.5)
WBC: 7.8 10*3/uL (ref 4.0–10.5)
nRBC: 0 % (ref 0.0–0.2)

## 2019-12-25 LAB — COMPREHENSIVE METABOLIC PANEL
ALT: 18 U/L (ref 0–44)
AST: 25 U/L (ref 15–41)
Albumin: 4.3 g/dL (ref 3.5–5.0)
Alkaline Phosphatase: 41 U/L (ref 38–126)
Anion gap: 8 (ref 5–15)
BUN: 18 mg/dL (ref 6–20)
CO2: 32 mmol/L (ref 22–32)
Calcium: 9.1 mg/dL (ref 8.9–10.3)
Chloride: 103 mmol/L (ref 98–111)
Creatinine, Ser: 1.3 mg/dL — ABNORMAL HIGH (ref 0.61–1.24)
GFR calc Af Amer: >60 mL/min (ref >60)
GFR calc non Af Amer: >60 mL/min (ref >60)
Glucose, Bld: 88 mg/dL (ref 70–99)
Potassium: 3.7 mmol/L (ref 3.5–5.1)
Sodium: 143 mmol/L (ref 135–145)
Total Bilirubin: 0.6 mg/dL (ref 0.3–1.2)
Total Protein: 7.6 g/dL (ref 6.5–8.1)

## 2019-12-25 LAB — SALICYLATE LEVEL: Salicylate Lvl: <7 mg/dL — ABNORMAL LOW (ref 7.0–30.0)

## 2019-12-25 LAB — URINE DRUG SCREEN, QUALITATIVE (ARMC ONLY)
Amphetamines, Ur Screen: NOT DETECTED
Barbiturates, Ur Screen: NOT DETECTED
Benzodiazepine, Ur Scrn: NOT DETECTED
Cannabinoid 50 Ng, Ur ~~LOC~~: POSITIVE — AB
Cocaine Metabolite,Ur ~~LOC~~: NOT DETECTED
MDMA (Ecstasy)Ur Screen: NOT DETECTED
Methadone Scn, Ur: NOT DETECTED
Opiate, Ur Screen: NOT DETECTED
Phencyclidine (PCP) Ur S: NOT DETECTED
Tricyclic, Ur Screen: NOT DETECTED

## 2019-12-25 LAB — SARS CORONAVIRUS 2 BY RT PCR (HOSPITAL ORDER, PERFORMED IN ~~LOC~~ HOSPITAL LAB): SARS Coronavirus 2: NEGATIVE

## 2019-12-25 LAB — ETHANOL: Alcohol, Ethyl (B): <10 mg/dL (ref <10)

## 2019-12-25 LAB — ACETAMINOPHEN LEVEL: Acetaminophen (Tylenol), Serum: <10 ug/mL — ABNORMAL LOW (ref 10–30)

## 2019-12-25 NOTE — ED Notes (Signed)
Hourly rounding reveals patient in room. No complaints, stable, in no acute distress. Q15 minute rounds and monitoring via Rover and Officer to continue.   

## 2019-12-25 NOTE — ED Notes (Addendum)
Pt was transferred from triage, with no sign of distress assessed or reported. Pt report hx of suicide yrs ago, pt denies HI and SIB. Pt is clam and cooperative. Pt denies pain, pt report AVH, and the are causing him distress. Pt is clam and cooperative during assessment

## 2019-12-25 NOTE — Consult Note (Signed)
[facility] Face-to-Face Psychiatry Consult   Reason for Consult: Hallucinations Referring Physician:  Dr. 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 male patient presented to [facility] ED via POV voluntary. Per the ED triage nurse note, the patient reported hearing things move around most of the time in his apartment and states his neighbor told him recently, and someone passed away in his apartment.  Therefore he feels he is hearing and seeing things like the dead person is around.  The patient was recently discharged from the behavioral health [facility] in [place] on a medication regimen.  The patient disclosed he has not been taking his medications.  This Probation officer provided education to the patient that he needs to take his medication as prescribed to him. It takes about 2 to 3 weeks to have the medication in his system to experience the effectiveness of his medication regimen.  The patient was also educated on stopping using marijuana to make sure his medication is working effectively.  He voiced agreement with the education provided to him.  The patient was seen face-to-face by this provider; the chart was reviewed and consulted with Dr. Alfred Levins on 12/25/2019 due to the patient's care. It was discussed with the EDP that the patient does not meet the criteria to be admitted to the psychiatric inpatient unit.  On evaluation, the patient is alert and oriented x4, calm, cooperative, and mood-congruent with affect. The patient does not appear to be  responding to internal or external stimuli. Neither is the patient presenting with any delusional thinking. The patient admits to auditory and visual hallucinations. The patient denies any suicidal, homicidal, or self-harm ideations. The patient is not presenting with any psychotic or paranoid behaviors. During an encounter with the patient, he was able to answer questions appropriately. .   Plan: The patient is not a safety risk to himself or others and does not require psychiatric inpatient admission for stabilization and treatment.  HPI: Per Dr. Alfred Levins: OSA [name] is a 24 y.o. male with a history of schizoaffective disorder who presents voluntarily for evaluation of hallucinations.  Patient endorses a history of intermittent hallucinations.  Has been seeing people in his apartment.  He has been hearing voices as well.  He denies any drinking or drug use.  Does not take his medications as prescribed.  Denies any suicidal thoughts.  Has had a history of prior suicide attempt several years ago.  He reports that his hallucinations are getting progressively more frequent.  Past Psychiatric History: yes   Risk to Self: Suicidal Ideation: No Suicidal Intent: No Is patient at risk for suicide?: No Suicidal Plan?: No Access to Means: No Triggers for Past Attempts: Unknown Intentional Self Injurious Behavior: None Risk to Others: Homicidal Ideation: No Thoughts of Harm to Others: No Current Homicidal Intent: No Current Homicidal Plan: No Access to Homicidal Means: No Assessment of Violence: None Noted Does patient have access to weapons?: No Criminal Charges Pending?: No Does patient have a court date: No Prior Inpatient Therapy: Prior Inpatient Therapy: Yes Prior Therapy Dates: 2021, 2020 Prior Therapy  Facilty/Provider(s): [facility] Reason for Treatment: MH issues Prior Outpatient Therapy: Prior Outpatient Therapy: Yes Prior Therapy Dates: UTA Prior Therapy Facilty/Provider(s): [facility] per  hx Reason for Treatment: Med mang Does patient have an ACCT team?: Unknown Does patient have Intensive In-House Services?  : Unknown Does patient have [facility] services? : Unknown Does patient have P4CC services?: Unknown  Past Medical History:  Past Medical History:  Diagnosis Date  . Cannabis abuse   . Paranoid schizophrenia (HCC)   . Schizoaffective disorder, depressive type (HCC) 09/17/2014   History reviewed. No pertinent surgical history. Family History:  Family History  Problem Relation Age of Onset  . Mental illness Brother   . Drug abuse Brother   . Mental illness Cousin   . Suicidality Cousin   . Diabetes Maternal Grandmother    Family Psychiatric  History: unknown Social History:  Social History   Substance and Sexual Activity  Alcohol Use Yes  . Alcohol/week: 4.0 - 6.0 standard drinks  . Types: 4 - 6 Shots of liquor per week     Social History   Substance and Sexual Activity  Drug Use Yes  . Types: Marijuana    Social History   Socioeconomic History  . Marital status: Single    Spouse name: Not on file  . Number of children: Not on file  . Years of education: Not on file  . Highest education level: Not on file  Occupational History  . Not on file  Tobacco Use  . Smoking status: Current Some Day Smoker    Packs/day: 0.25  . Smokeless tobacco: Never Used  Vaping Use  . Vaping Use: Never used  Substance and Sexual Activity  . Alcohol use: Yes    Alcohol/week: 4.0 - 6.0 standard drinks    Types: 4 - 6 Shots of liquor per week  . Drug use: Yes    Types: Marijuana  . Sexual activity: Yes    Birth control/protection: Condom, Rhythm  Other Topics Concern  . Not on file  Social History Narrative  . Not on file   Social Determinants of Health   Financial Resource Strain:   . Difficulty of Paying Living Expenses:   Food Insecurity:   . Worried About Programme researcher, broadcasting/film/video in the Last Year:   . Barista in the Last Year:   Transportation  Needs:   . Freight forwarder (Medical):   Marland Kitchen Lack of Transportation (Non-Medical):   Physical Activity:   . Days of Exercise per Week:   . Minutes of Exercise per Session:   Stress:   . Feeling of Stress :   Social Connections:   . Frequency of Communication with Friends and Family:   . Frequency of Social Gatherings with Friends and Family:   . Attends Religious Services:   . Active Member of Clubs or Organizations:   . Attends Banker Meetings:   Marland Kitchen Marital Status:    Additional Social History:    Allergies:   Allergies  Allergen Reactions  . Haldol [Haloperidol Lactate] Other (See Comments)    Muscle stiffness  . Abilify [Aripiprazole]     eps  . Penicillins Hives    Felt like throat was closing Has patient had a PCN reaction causing immediate rash, facial/tongue/throat swelling, SOB or lightheadedness with hypotension: Unknown Has patient had a PCN reaction causing severe rash involving mucus membranes or skin necrosis: unknown Has patient had a PCN reaction that required hospitalization Unknown Has patient had a  PCN reaction occurring within the last 10 years: Unknown If all of the above answers are "NO", then may proceed with Cephalosporin use.   . Zyprexa [Olanzapine]     eps    Labs:  Results for orders placed or performed during the hospital encounter of 12/25/19 (from the past 48 hour(s))  Comprehensive metabolic panel     Status: Abnormal   Collection Time: 12/24/19 11:52 PM  Result Value Ref Range   Sodium 143 135 - 145 mmol/L   Potassium 3.7 3.5 - 5.1 mmol/L   Chloride 103 98 - 111 mmol/L   CO2 32 22 - 32 mmol/L   Glucose, Bld 88 70 - 99 mg/dL    Comment: Glucose reference range applies only to samples taken after fasting for at least 8 hours.   BUN 18 6 - 20 mg/dL   Creatinine, Ser 1.611.30 (H) 0.61 - 1.24 mg/dL   Calcium 9.1 8.9 - 09.610.3 mg/dL   Total Protein 7.6 6.5 - 8.1 g/dL   Albumin 4.3 3.5 - 5.0 g/dL   AST 25 15 - 41 U/L   ALT 18 0  - 44 U/L   Alkaline Phosphatase 41 38 - 126 U/L   Total Bilirubin 0.6 0.3 - 1.2 mg/dL   GFR calc non Af Amer >60 >60 mL/min   GFR calc Af Amer >60 >60 mL/min   Anion gap 8 5 - 15    Comment: Performed at [facility], [address] [place], [place] [address]  Ethanol     Status: None   Collection Time: 12/24/19 11:52 PM  Result Value Ref Range   Alcohol, Ethyl (B) <10 <10 mg/dL    Comment: (NOTE) Lowest detectable limit for serum alcohol is 10 mg/dL.  For medical purposes only. Performed at [facility], [address] [place], [place] [address]   Salicylate level     Status: Abnormal   Collection Time: 12/24/19 11:52 PM  Result Value Ref Range   Salicylate Lvl <7.0 (L) 7.0 - 30.0 mg/dL    Comment: Performed at [facility], [address] [place], [place] [address]  Acetaminophen level     Status: Abnormal   Collection Time: 12/24/19 11:52 PM  Result Value Ref Range   Acetaminophen (Tylenol), Serum <10 (L) 10 - 30 ug/mL    Comment: (NOTE) Therapeutic concentrations vary significantly. A range of 10-30 ug/mL  may be an effective concentration for many patients. However, some  are best treated at concentrations outside of this range. Acetaminophen concentrations >150 ug/mL at 4 hours after ingestion  and >50 ug/mL at 12 hours after ingestion are often associated with  toxic reactions.  Performed at [facility], [address] [place], [place] [address]   cbc     Status: None   Collection Time: 12/24/19 11:52 PM  Result Value Ref Range   WBC 7.8 4.0 - 10.5 K/uL   RBC 4.88 4.22 - 5.81 MIL/uL   Hemoglobin 14.8 13.0 - 17.0 g/dL   HCT 30.844.8 39 - 52 %   MCV 91.8 80.0 - 100.0 fL   MCH 30.3 26.0 - 34.0 pg   MCHC 33.0 30.0 - 36.0 g/dL   RDW 65.713.3 84.611.5 - 96.215.5 %   Platelets 198 150 - 400 K/uL   nRBC 0.0 0.0 - 0.2 %    Comment: Performed at [facility], [address] [place], [place] [address]  Urine Drug Screen,  Qualitative     Status: Abnormal  Collection Time: 12/24/19 11:52 PM  Result Value Ref Range   Tricyclic, Ur Screen NONE DETECTED NONE DETECTED   Amphetamines, Ur Screen NONE DETECTED NONE DETECTED   MDMA (Ecstasy)Ur Screen NONE DETECTED NONE DETECTED   Cocaine Metabolite,Ur [place] NONE DETECTED NONE DETECTED   Opiate, Ur Screen NONE DETECTED NONE DETECTED   Phencyclidine (PCP) Ur S NONE DETECTED NONE DETECTED   Cannabinoid 50 Ng, Ur [place] POSITIVE (A) NONE DETECTED   Barbiturates, Ur Screen NONE DETECTED NONE DETECTED   Benzodiazepine, Ur Scrn NONE DETECTED NONE DETECTED   Methadone Scn, Ur NONE DETECTED NONE DETECTED    Comment: (NOTE) Tricyclics + metabolites, urine    Cutoff 1000 ng/mL Amphetamines + metabolites, urine  Cutoff 1000 ng/mL MDMA (Ecstasy), urine              Cutoff 500 ng/mL Cocaine Metabolite, urine          Cutoff 300 ng/mL Opiate + metabolites, urine        Cutoff 300 ng/mL Phencyclidine (PCP), urine         Cutoff 25 ng/mL Cannabinoid, urine                 Cutoff 50 ng/mL Barbiturates + metabolites, urine  Cutoff 200 ng/mL Benzodiazepine, urine              Cutoff 200 ng/mL Methadone, urine                   Cutoff 300 ng/mL  The urine drug screen provides only a preliminary, unconfirmed analytical test result and should not be used for non-medical purposes. Clinical consideration and professional judgment should be applied to any positive drug screen result due to possible interfering substances. A more specific alternate chemical method must be used in order to obtain a confirmed analytical result. Gas chromatography / mass spectrometry (GC/MS) is the preferred confirm atory method. Performed at [facility], [address] [place], [place] [address]   SARS Coronavirus 2 by RT PCR (hospital order, performed in [facility] hospital lab) Nasopharyngeal Nasopharyngeal Swab     Status: None   Collection Time: 12/25/19 12:59 AM   Specimen: Nasopharyngeal  Swab  Result Value Ref Range   SARS Coronavirus 2 NEGATIVE NEGATIVE    Comment: (NOTE) SARS-CoV-2 target nucleic acids are NOT DETECTED.  The SARS-CoV-2 RNA is generally detectable in upper and lower respiratory specimens during the acute phase of infection. The lowest concentration of SARS-CoV-2 viral copies this assay can detect is 250 copies / mL. A negative result does not preclude SARS-CoV-2 infection and should not be used as the sole basis for treatment or other patient management decisions.  A negative result may occur with improper specimen collection / handling, submission of specimen other than nasopharyngeal swab, presence of viral mutation(s) within the areas targeted by this assay, and inadequate number of viral copies (<250 copies / mL). A negative result must be combined with clinical observations, patient history, and epidemiological information.  Fact Sheet for Patients:   BoilerBrush.com.cy  Fact Sheet for Healthcare Providers: https://pope.com/  This test is not yet approved or  cleared by the [place] FDA and has been authorized for detection and/or diagnosis of SARS-CoV-2 by FDA under an Emergency Use Authorization (EUA).  This EUA will remain in effect (meaning this test can be used) for the duration of the COVID-19 declaration under Section 564(b)(1) of the Act, 21 U.S.C. section 360bbb-3(b)(1), unless the authorization is terminated or revoked sooner.  Performed at [facility], [address] [place], [place] [address]     No current facility-administered medications for this encounter.   Current Outpatient Medications  Medication Sig Dispense Refill  . buPROPion (WELLBUTRIN XL) 150 MG 24 hr tablet Take 1 tablet (150 mg total) by mouth daily. For depression 30 tablet 0  . divalproex (DEPAKOTE) 500 MG DR tablet Take 1 tablet (500 mg total) by mouth every 12 (twelve) hours. For mood  stabilization 60 tablet 0  . hydrOXYzine (ATARAX/VISTARIL) 25 MG tablet Take 1 tablet (25 mg total) by mouth 3 (three) times daily as needed for anxiety. 75 tablet 0  . QUEtiapine (SEROQUEL) 300 MG tablet Take 2 tablets (600 mg total) by mouth at bedtime. For mood control 60 tablet 0  . traZODone (DESYREL) 50 MG tablet Take 1 tablet (50 mg total) by mouth at bedtime as needed for sleep. 30 tablet 0    Musculoskeletal: Strength & Muscle Tone: within normal limits Gait & Station: normal Patient leans: N/A  Psychiatric Specialty Exam: Physical Exam  Nursing note and vitals reviewed. Constitutional: He is oriented to person, place, and time. He appears well-developed.  HENT:  Head: Normocephalic.  Eyes: Pupils are equal, round, and reactive to light.  Respiratory: Effort normal.  Musculoskeletal:        General: Normal range of motion.     Cervical back: Normal range of motion.  Neurological: He is alert and oriented to person, place, and time.  Skin: Skin is warm and dry.  Psychiatric: His speech is normal. Judgment and thought content normal. His affect is blunt. He is slowed and withdrawn. He exhibits a depressed mood.    Review of Systems  Psychiatric/Behavioral: Positive for hallucinations.  All other systems reviewed and are negative.   Blood pressure 121/67, pulse 87, temperature 98.2 F (36.8 C), temperature source Oral, resp. rate 20, weight 111.1 kg, SpO2 98 %.Body mass index is 29.82 kg/m.  General Appearance: Casual  Eye Contact:  Poor  Speech:  Slow  Volume:  Decreased  Mood:  Depressed  Affect:  Congruent, Depressed and Flat  Thought Process:  Disorganized and Descriptions of Associations: Circumstantial  Orientation:  Full (Time, Place, and Person)  Thought Content:  Illogical and Hallucinations: Auditory Visual  Suicidal Thoughts:  No  Homicidal Thoughts:  No  Memory:  NA Immediate;   Good Recent;   Good Remote;   Good  Judgement:  Impaired  Insight:   Lacking  Psychomotor Activity:  Normal  Concentration:  Attention Span: Fair  Recall:  [employer] of Knowledge:  Fair  Language:  Fair  Akathisia:  NA  Handed:  Right  AIMS (if indicated):     Assets:  Social Support  ADL's:  Impaired  Cognition:  Impaired,  Moderate  Sleep:        Treatment Plan Summary: Medication management and Plan The patient does not meet criteria for psychiatric inpatient admission.  Disposition: No evidence of imminent risk to self or others at present.   Patient does not meet criteria for psychiatric inpatient admission. Supportive therapy provided about ongoing stressors. The patient does not meet criteria for psychiatric inpatient admission.  Gillermo Murdoch, NP 12/25/2019 5:52 AM

## 2019-12-25 NOTE — ED Notes (Signed)
Pt given meal tray.

## 2019-12-25 NOTE — ED Notes (Signed)
Pt denies SI/HI/AVH on assessment 

## 2019-12-25 NOTE — ED Notes (Signed)
Pt discharging home. Discharge teaching and follow-up care reviewed with pt, and pt verbalized understanding. Pt given all his personal belongings back. Pt given bus pass per his request. Escorted to lobby, ambulatory and in NAD.

## 2019-12-25 NOTE — BH Assessment (Signed)
Assessment Note  Timothy [name] is an 24 y.o. male who presents to the ED voluntarily. Per the initial triage note, "Pt to ED reporting he is hearing sounds in his apartment and states his neighbors told him recently someone died in the apartment and pt now feels he is hearing this fall off the walls. Pt visual hallucinations. PT was recently admitted to this hospital for behavioral health but when asked he doesn't remember why he was admitted. No drinking or drug use. Difficulty sleeping intermittently. Pt fixated on a ghost being in his hose and continuously brings it back into the conversation. PT calm and cooperative in triage."    Writer assessed patient and patient presented with a calm demeanor. Patient was alert and oriented x4. Patient reported that his neighbor told him that someone recently died in his apartment. Patient reported that since then he has been paranoid and thinks he sees dark figures walk through his apartment. Patient reports he was recently admitted to another mental health hospital [facility]) last week and was discharged. Patient reports he is not taking his medications. Patient endorsed smoking marijuana because "its the only thing that helps".   Patient was provided emotional support and also educated on the importance of taking his medications. Patient was educated on how long medications can sometimes take to begin working. Patient voiced understanding and reported he would start taking his medications. Patient denied SI/HI.  Patient will be offered resources for his area to begin mental health treatment.   This case was staffed with Annice Pih, NP and Don Perking, MD. Patient was found to be appropriate for discharge due to recent d/c from [facility] and reporting he hasnt been taking his medications provided to him for the last few days. Patient denied SI/HI. Patient is deemed safe to return home with mental health resources.                                                                                                                                                 Diagnosis: Schizoaffective D/O, Paranoid Schizophrenia  Past Medical History:  Past Medical History:  Diagnosis Date  . Cannabis abuse   . Paranoid schizophrenia (HCC)   . Schizoaffective disorder, depressive type (HCC) 09/17/2014    History reviewed. No pertinent surgical history.  Family History:  Family History  Problem Relation Age of Onset  . Mental illness Brother   . Drug abuse Brother   . Mental illness Cousin   . Suicidality Cousin   . Diabetes Maternal Grandmother     Social History:  reports that he has been smoking. He has been smoking about 0.25 packs per day. He has never used smokeless tobacco. He reports current alcohol use of about 4.0 - 6.0 standard drinks of alcohol per week. He reports current drug use. Drug: Marijuana.  Additional Social History:  Alcohol / Drug  Use Pain Medications: See PTA Prescriptions: See PTA Over the Counter: See PTA History of alcohol / drug use?: Yes  CIWA: CIWA-Ar BP: 121/67 Pulse Rate: 87 COWS:    Allergies:  Allergies  Allergen Reactions  . Haldol [Haloperidol Lactate] Other (See Comments)    Muscle stiffness  . Abilify [Aripiprazole]     eps  . Penicillins Hives    Felt like throat was closing Has patient had a PCN reaction causing immediate rash, facial/tongue/throat swelling, SOB or lightheadedness with hypotension: Unknown Has patient had a PCN reaction causing severe rash involving mucus membranes or skin necrosis: unknown Has patient had a PCN reaction that required hospitalization Unknown Has patient had a PCN reaction occurring within the last 10 years: Unknown If all of the above answers are "NO", then may proceed with Cephalosporin use.   . Zyprexa [Olanzapine]     eps    Home Medications: (Not in a hospital admission)   OB/GYN Status:  No LMP for male patient.  General Assessment Data Location of Assessment: [facility] ED TTS  Assessment: In system Is this a Tele or Face-to-Face Assessment?: Face-to-Face Is this an Initial Assessment or a Re-assessment for this encounter?: Initial Assessment Patient Accompanied by:: N/A Language Other than English: No Living Arrangements:  (Private home) What gender do you identify as?: Male Marital status: Single Living Arrangements: Alone Can pt return to current living arrangement?: Yes Admission Status: Voluntary Is patient capable of signing voluntary admission?: Yes     Crisis Care Plan Living Arrangements: Alone  Education Status Is patient currently in school?: No Is the patient employed, unemployed or receiving disability?: Employed  Risk to self with the past 6 months Suicidal Ideation: No Has patient been a risk to self within the past 6 months prior to admission? : No Suicidal Intent: No Has patient had any suicidal intent within the past 6 months prior to admission? : No Is patient at risk for suicide?: No Suicidal Plan?: No Has patient had any suicidal plan within the past 6 months prior to admission? : No Access to Means: No Previous Attempts/Gestures: No Triggers for Past Attempts: Unknown Intentional Self Injurious Behavior: None Family Suicide History: No Recent stressful life event(s): Other (Comment) Persecutory voices/beliefs?: No Depression: No Substance abuse history and/or treatment for substance abuse?: Yes Suicide prevention information given to non-admitted patients: Not applicable  Risk to Others within the past 6 months Homicidal Ideation: No Does patient have any lifetime risk of violence toward others beyond the six months prior to admission? : No Thoughts of Harm to Others: No Current Homicidal Intent: No Current Homicidal Plan: No Access to Homicidal Means: No Assessment of Violence: None Noted Does patient have access to weapons?: No Criminal Charges Pending?: No Does patient have a court date: No Is patient on  probation?: No  Psychosis Hallucinations: None noted Delusions: None noted  Mental Status Report Appearance/Hygiene: Unremarkable Eye Contact: Fair Motor Activity: Unable to assess Speech: Logical/coherent Level of Consciousness: Quiet/awake Mood: Apathetic, Suspicious Affect: Anxious Anxiety Level: Moderate Thought Processes: Coherent, Relevant Judgement: Impaired Orientation: Person, Place, Time, Situation, Appropriate for developmental age Obsessive Compulsive Thoughts/Behaviors: Unable to Assess  Cognitive Functioning Concentration: Normal Memory: Recent Intact, Remote Intact Is patient IDD: No Insight: Fair Impulse Control: Unable to Assess Appetite: Fair Have you had any weight changes? : No Change Sleep: No Change Total Hours of Sleep:  (unsure) Vegetative Symptoms: Unable to Assess  ADLScreening [facility] Assessment Services) Patient's cognitive ability adequate to safely complete daily  activities?: Yes Patient able to express need for assistance with ADLs?: Yes Independently performs ADLs?: Yes (appropriate for developmental age)  Prior Inpatient Therapy Prior Inpatient Therapy: Yes Prior Therapy Dates: 2021, 2020 Prior Therapy Facilty/Provider(s): [facility] Reason for Treatment: MH issues  Prior Outpatient Therapy Prior Outpatient Therapy: Yes Prior Therapy Dates: UTA Prior Therapy Facilty/Provider(s): [facility] per hx Reason for Treatment: Med mang Does patient have an ACCT team?: Unknown Does patient have Intensive In-House Services?  : Unknown Does patient have [facility] services? : Unknown Does patient have P4CC services?: Unknown  ADL Screening (condition at time of admission) Patient's cognitive ability adequate to safely complete daily activities?: Yes Is the patient deaf or have difficulty hearing?: No Does the patient have difficulty seeing, even when wearing glasses/contacts?: No Does the patient have difficulty concentrating, remembering, or making  decisions?: No Patient able to express need for assistance with ADLs?: Yes Does the patient have difficulty dressing or bathing?: No Independently performs ADLs?: Yes (appropriate for developmental age) Does the patient have difficulty walking or climbing stairs?: No Weakness of Legs: None Weakness of Arms/Hands: None  Home Assistive Devices/Equipment Home Assistive Devices/Equipment: None  Therapy Consults (therapy consults require a physician order) PT Evaluation Needed: No OT Evalulation Needed: No SLP Evaluation Needed: No Abuse/Neglect Assessment (Assessment to be complete while patient is alone) Abuse/Neglect Assessment Can Be Completed: Yes Physical Abuse: Denies Verbal Abuse: Denies Sexual Abuse: Denies Exploitation of patient/patient's resources: Denies Self-Neglect: Denies Values / Beliefs Cultural Requests During Hospitalization: None Spiritual Requests During Hospitalization: None Consults Spiritual Care Consult Needed: No Transition of Care Team Consult Needed: No Advance Directives (For Healthcare) Does Patient Have a Medical Advance Directive?: No Would patient like information on creating a medical advance directive?: No - Patient declined          Disposition:  Disposition Initial Assessment Completed for this Encounter: Yes Disposition of Patient: Discharge Patient refused recommended treatment: No Mode of transportation if patient is discharged/movement?: N/A Patient referred to: Outpatient clinic referral  On Site Evaluation by:   Reviewed with Physician:    Willene Hatchet, MSc., [facility], [facility] 12/25/2019 5:02 AM

## 2019-12-25 NOTE — ED Notes (Signed)
Black pants Black socks Grey tank top [employer] Black belt Black crocs  Three bracelets/rubber bands Black hoodie Black dew rag Dark blue bag Light blue bag Headphones Wallet Keys Cigarettes

## 2019-12-25 NOTE — ED Provider Notes (Signed)
[facility] Emergency Department Provider Note  ____________________________________________  Time seen: Approximately 2:26 AM  I have reviewed the triage vital signs and the nursing notes.   HISTORY  Chief Complaint Hallucinations   HPI Timothy [name] is a 24 y.o. male with a history of schizoaffective disorder  who presents voluntarily for evaluation of hallucinations.  Patient endorses a history of intermittent hallucinations.  Has been seeing people in his apartment.  He has been hearing voices as well.  He denies any drinking or drug use.  Does not take his medications as prescribed.  Denies any suicidal thoughts.  Has had a history of prior suicide attempt several years ago.  He reports that his hallucinations are getting progressively more frequent.  Past Medical History:  Diagnosis Date  . Cannabis abuse   . Paranoid schizophrenia ([place])   . Schizoaffective disorder, depressive type ([place]) 09/17/2014    Patient Active Problem List   Diagnosis Date Noted  . 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 Schizoaffective disorder, depressive type ([place]) 08/12/2016  . Schizoaffective disorder, bipolar type ([place]) 09/18/2014  . Tobacco use disorder 09/18/2014  . 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 Cannabis use disorder, severe, dependence ([place]) 09/17/2014    History reviewed. No pertinent surgical history.  Prior to Admission medications   Medication Sig Start Date End Date Taking? Authorizing Provider  buPROPion (WELLBUTRIN XL) 150 MG 24 hr tablet Take 1 tablet (150 mg total) by mouth daily. For depression 12/22/19  Yes Lindell Spar I, NP  divalproex (DEPAKOTE) 500 MG DR tablet Take 1 tablet (500 mg total) by mouth every 12 (twelve) hours. For mood stabilization 12/21/19  Yes Lindell Spar I, NP  hydrOXYzine (ATARAX/VISTARIL) 25 MG tablet Take 1 tablet (25 mg total) by mouth 3 (three) times daily as needed for anxiety. 12/21/19  Yes Lindell Spar I, NP  QUEtiapine (SEROQUEL) 300 MG tablet Take 2 tablets (600 mg total) by mouth at bedtime. For mood control 12/21/19  Yes Lindell Spar I, NP  traZODone (DESYREL) 50 MG tablet Take 1 tablet (50 mg total) by mouth at bedtime as needed for sleep. 12/21/19  Yes Lindell Spar I, NP    Allergies Haldol [haloperidol lactate], Abilify [aripiprazole], Penicillins, and Zyprexa [olanzapine]  Family History  Problem Relation Age of Onset  . Mental illness Brother   . Drug abuse Brother   . Mental illness Cousin   . Suicidality Cousin   . Diabetes Maternal Grandmother     Social History Social History   Tobacco Use  . Smoking status: Current Some Day Smoker    Packs/day: 0.25  . Smokeless tobacco: Never Used  Vaping Use  . Vaping Use: Never used  Substance Use Topics  . Alcohol use: Yes    Alcohol/week: 4.0 - 6.0 standard drinks    Types: 4 - 6 Shots of liquor per week  . Drug use: Yes    Types: Marijuana    Review of Systems  Constitutional: Negative for fever. Eyes: Negative for visual changes. ENT: Negative for sore throat. Neck: No neck pain  Cardiovascular: Negative for chest pain. Respiratory: Negative for shortness of breath. Gastrointestinal: Negative for abdominal pain, vomiting or diarrhea. Genitourinary: Negative for dysuria. Musculoskeletal: Negative for back pain. Skin: Negative for rash. Neurological: Negative for headaches, weakness or numbness. Psych: No SI or HI. + hallucinations  ____________________________________________   PHYSICAL EXAM:  VITAL SIGNS: ED Triage Vitals  Enc Vitals Group     BP 12/24/19 2336 121/67  Pulse Rate 12/24/19 2336 87     Resp 12/24/19 2336 20     Temp 12/24/19 2336 98.2 F (36.8 C)     Temp Source 12/24/19 2336 Oral     SpO2 12/24/19 2336 98 %     Weight 12/24/19 2339 245 lb (111.1 kg)     Height --      Head Circumference --      Peak Flow --      Pain Score 12/24/19 2349 0     Pain Loc --      Pain Edu?  --      Excl. in GC? --     Constitutional: Alert and oriented. Well appearing and in no apparent distress. HEENT:      Head: Normocephalic and atraumatic.         Eyes: Conjunctivae are normal. Sclera is non-icteric.       Mouth/Throat: Mucous membranes are moist.       Neck: Supple with no signs of meningismus. Cardiovascular: Regular rate and rhythm.  Respiratory: Normal respiratory effort.  Gastrointestinal: Soft, non tender, and non distended. Musculoskeletal: No edema, cyanosis, or erythema of extremities. Neurologic: Normal speech and language. Face is symmetric. Moving all extremities. No gross focal neurologic deficits are appreciated. Skin: Skin is warm, dry and intact. No rash noted. Psychiatric: Mood and affect are normal. Speech and behavior are normal.  ____________________________________________   LABS (all labs ordered are listed, but only abnormal results are displayed)  Labs Reviewed  COMPREHENSIVE METABOLIC PANEL - Abnormal; Notable for the following components:      Result Value   Creatinine, Ser 1.30 (*)    All other components within normal limits  SALICYLATE LEVEL - Abnormal; Notable for the following components:   Salicylate Lvl <7.0 (*)    All other components within normal limits  ACETAMINOPHEN LEVEL - Abnormal; Notable for the following components:   Acetaminophen (Tylenol), Serum <10 (*)    All other components within normal limits  URINE DRUG SCREEN, QUALITATIVE ([facility] ONLY) - Abnormal; Notable for the following components:   Cannabinoid 50 Ng, Ur [place] POSITIVE (*)    All other components within normal limits  SARS CORONAVIRUS 2 BY RT PCR (HOSPITAL ORDER, PERFORMED IN [facility])  ETHANOL  CBC   ____________________________________________  EKG  none  ____________________________________________  RADIOLOGY  none  ____________________________________________   PROCEDURES  Procedure(s) performed: None Procedures Critical  Care performed:  None ____________________________________________   INITIAL IMPRESSION / ASSESSMENT AND PLAN / ED COURSE  24 y.o. male with a history of schizoaffective disorder  who presents voluntarily for evaluation of hallucinations.  Patient is pleasant and cooperative, endorses visual and auditory hallucinations.  No SI or HI.  No indication for IVC placement. Labs for medical clearance with no acute abnormalities.  UDS positive for cannabinoids.  Patient is medically cleared awaiting psychiatric evaluation    The patient has been placed in psychiatric observation due to the need to provide a safe environment for the patient while obtaining psychiatric consultation and evaluation, as well as ongoing medical and medication management to treat the patient's condition.  The patient has not been placed under full IVC at this time.   Please note:  Patient was evaluated in Emergency Department today for the symptoms described in the history of present illness. Patient was evaluated in the context of the global COVID-19 pandemic, which necessitated consideration that the patient might be at risk for infection with the SARS-CoV-2 virus that  causes COVID-19. Institutional protocols and algorithms that pertain to the evaluation of patients at risk for COVID-19 are in a state of rapid change based on information released by regulatory bodies including the CDC and federal and state organizations. These policies and algorithms were followed during the patient's care in the ED.  Some ED evaluations and interventions may be delayed as a result of limited staffing during the pandemic.   ____________________________________________   FINAL CLINICAL IMPRESSION(S) / ED DIAGNOSES   Final diagnoses:  Schizophrenia, unspecified type (HCC)      NEW MEDICATIONS STARTED DURING THIS VISIT:  ED Discharge Orders    None       Note:  This document was prepared using Dragon voice recognition software and  may include unintentional dictation errors.    Don Perking, [place], MD 12/25/19 0230

## 2019-12-31 ENCOUNTER — Encounter (HOSPITAL_COMMUNITY): Payer: Self-pay | Admitting: Licensed Clinical Social Worker

## 2019-12-31 ENCOUNTER — Other Ambulatory Visit: Payer: Self-pay

## 2019-12-31 ENCOUNTER — Ambulatory Visit (INDEPENDENT_AMBULATORY_CARE_PROVIDER_SITE_OTHER): Payer: No Payment, Other | Admitting: Licensed Clinical Social Worker

## 2019-12-31 DIAGNOSIS — F259 Schizoaffective disorder, unspecified: Secondary | ICD-10-CM | POA: Diagnosis not present

## 2019-12-31 NOTE — Progress Notes (Signed)
Comprehensive Clinical Assessment (CCA) Note  12/31/2019 Timothy [name] [id number]  Visit Diagnosis:      ICD-10-CM   1. Schizoaffective disorder, unspecified type ([place])  F25.9     Client is a  24 year old Male. Client is referred by [facility] for a schizoaffective unspecified type.   Client states mental health symptoms as evidenced by:   Delusions, hallucinations, disorganized thoughts or behaviors, irritability, depression, confusion, flight of ideas, flight of reference, and triangulation of thoughts.  Client states he does have SI thoughts without plan or intent safety plan conducted and pt agreeable.    Client was screened for the following SDOH: Smoking, exercise, stress, social interaction, depression, and alcohol.    Assessment Information that integrates subjective and objective details with a therapist's professional interpretation:   LCSW and pt met face to face for 60 min initial evaluation. Pt was alert and oriented x 5, unremarkable speech, with flat slow affect. Pt engaging well throughout assessment. Timothy [name] reports that he did not take his medication this morning because he forgot but has been compliant since getting out of the hospital. He reports many stressor in his life including him saying that he has a 51/60 year old son that he is not sure is his, he reports he has been putting money away for him for years however has never met him or have contact with the baby's mother. LCSW asked pt to confirm that the child was born when pt was 15 years old, but pt was insistent that it did not happen until he was 24 years old.  Pt states at night he hears voices which he does endorse worsens at night stating "Like I hear banging on the wall or people talking loudly, but I do not know what they are saying" Usually when this happens he goes outside to sit on the porch this is when he states his grandma is trying to tell him something through the trees or animals. LCSW did discuss with pt whether  these voices or pt grandmother ever tell him to do anything and pt did state he can never understand them. Other reported stressor are the work ethic of his colleagues at [employer], "I just feel like they do not do anything. I just count down the hours until I can go home and smoke". [name] does report he spends about 100.00 weekly on marijuana and smoke 3 to 4 times daily.   Client meets criteria for schizoaffective disorder    Client states use of the following substances: Marijuana   Therapist addressed (substance use) concern, although client meets criteria, he/ she reports they do not wish to pursue tx at this time although therapist feels they would benefit from [place] counseling. (IF CLIENT HAS A S/A PROBLEM)   Treatment recommendations are include plan: Overall goal is to decrease auditory hallucinations and delusions. Increase social skills and interaction.   Goals: Elevate mood and show evidence of usual energy, activities, and socialization level.; Reduce irritability and increase normal social interaction with family and friends.  Objective: Take medications daily, increase daily exercise to 3 x weekly, complete 1 dinner weekly with grandmother.       Clinician assisted client with scheduling the following appointments: 2 weeks . Clinician details of appointment.    Client agreed with treatment recommendations.  CCA Screening, Triage and Referral (STR)  Patient Reported Information  Whom do you see for routine medical problems? I don't have a doctor  What Do You Feel Would Help  You the Most Today? Therapy;Medication   Have You Recently Been in Any Inpatient Treatment (Hospital/Detox/Crisis Center/28-Day Program)? Yes  Name/Location of Program/Hospital:[facility]   Have You Ever Received Services From [employer] Before? Yes  Who Do You See at [facility]? ED   Have You Recently Had Any Thoughts About Hurting Yourself? Yes  Are You Planning to Commit Suicide/Harm  Yourself At This time? Yes   Have you Recently Had Thoughts About Hurting Someone Timothy [name]? No  Explanation: No data recorded  Have You Used Any Alcohol or Drugs in the Past 24 Hours? No   Do You Currently Have a Therapist/Psychiatrist? No   Have You Been Recently Discharged From Any Office Practice or Programs? No     CCA Screening Triage Referral Assessment Type of Contact: Face-to-Face  Date Telepsych consult ordered in [facility]:  12/15/19   Patient Reported Information Reviewed? Yes   Is CPS involved or ever been involved? Never  Is APS involved or ever been involved? Never   Patient Determined To Be At Risk for Harm To Self or Others Based on Review of Patient Reported Information or Presenting Complaint? Yes, for Self-Harm (Pt reports that he feels like he would be better off dead but states " I would never act on it now" Pt has Hx of cutting himself in highschool. No current plan or means including knifes pt reports)  Are There Guns or Other Weapons in [place]? No   Location of Assessment: [facility]   Does Patient Present under Involuntary Commitment? No   [place] of Residence: [place]   Options For Referral: Medication Management     CCA Biopsychosocial  Intake/Chief Complaint:  CCA Intake With Chief Complaint Chief Complaint/Presenting Problem: schizoaffective disorder with non command hallucinations and dellusions Patient's Currently Reported Symptoms/Problems: hallucinations "When I go to sleep I hear stuff moving", delusions " My grandma is trying to tell me something, but she died 2 years ago". Idea of refrence, flight of thought, Individual's Strengths: working full time Initial Clinical Notes/Concerns: hallucinations and delusions  Mental Health Symptoms Depression:  Depression: Difficulty Concentrating, Worthlessness, Hopelessness  Mania:  Mania: Racing thoughts  Anxiety:      Psychosis:  Psychosis: Affective  flattening/alogia/avolition, Delusions, Hallucinations, Grossly disorganized speech  Trauma:     Obsessions:     Compulsions:     Inattention:     Hyperactivity/Impulsivity:     Oppositional/Defiant Behaviors:     Emotional Irregularity:     Other Mood/Personality Symptoms:      Mental Status Exam Appearance and self-care  Stature:  Stature: Tall  Weight:  Weight: Average weight  Clothing:  Clothing: Casual  Grooming:  Grooming: Normal  Cosmetic use:  Cosmetic Use: None  Posture/gait:  Posture/Gait: Normal  Motor activity:  Motor Activity: Slowed  Sensorium  Attention:  Attention: Distractible  Concentration:  Concentration: Scattered  Orientation:  Orientation: X5  Recall/memory:  Recall/Memory: Normal  Affect and Mood  Affect:     Mood:  Mood: Hopeless  Relating  Eye contact:  Eye Contact: Avoided  Facial expression:     Attitude toward examiner:  Attitude Toward Examiner: Cooperative  Thought and Language  Speech flow:    Thought content:  Thought Content: Delusions, Ideas of Influence, Ideas of Reference  Preoccupation:     Hallucinations:  Hallucinations: Auditory, Visual (Grandma who passed away two years ago will try and tell him to do things, but pt cannot make out what she is saying.)  Organization:  Transport planner of Knowledge:     Intelligence:  Intelligence: Average  Abstraction:  Abstraction: Brewing technologist:     Reality Testing:  Reality Testing: Distorted  Insight:  Insight: Gaps  Decision Making:     Social Functioning  Social Maturity:  Social Maturity: Isolates  Social Judgement:     Stress  Stressors:  Stressors: Family conflict, Work, Illness  Coping Ability:     Skill Deficits:     Supports:  Supports: Family     Religion: Religion/Spirituality Are You A Religious Person?: Yes  Leisure/Recreation: Leisure / Recreation Do You Have Hobbies?: No  Exercise/Diet: Exercise/Diet Do You Exercise?: No Have You  Gained or Lost A Significant Amount of Weight in the Past Six Months?: No Do You Follow a Special Diet?: No Do You Have Any Trouble Sleeping?: Yes Explanation of Sleeping Difficulties: trouble falling asleep.   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; about a year at [employer] Patient's job has been impacted by current illness: No What is the longest time patient has a held a job?: 5 years Where was the patient employed at that time?: fast food Has patient ever been in the [employer]?: No Did You Receive Any Psychiatric Treatment/Services While in the [employer]?: No (NA)  Education: Education Last Grade Completed: 12 Did Teacher, adult education From [employer]?: Yes Did [place]?: No Did You Attend Graduate School?: No   CCA Family/Childhood History  Family and Relationship History:    Childhood History:     Child/Adolescent Assessment:         DSM5 Diagnoses: Patient Active Problem List   Diagnosis Date Noted  . Benzodiazepine abuse ([place]) 08/13/2016  . Schizoaffective disorder, depressive type ([place]) 08/12/2016  . Schizoaffective disorder, bipolar type ([place]) 09/18/2014  . Tobacco use disorder 09/18/2014  . 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 Cannabis use disorder, severe, dependence ([place]) 09/17/2014    Patient Centered Plan: Patient is on the following Treatment Plan(s):  Depression   Referrals to Alternative Service(s): Referred to Alternative Service(s):   Place:   Date:   Time:    Referred to Alternative Service(s):   Place:   Date:   Time:    Referred to Alternative Service(s):   Place:   Date:   Time:    Referred to Alternative Service(s):   Place:   Date:   Time:     Dory Horn

## 2020-01-16 ENCOUNTER — Ambulatory Visit (HOSPITAL_COMMUNITY): Payer: Federal, State, Local not specified - Other | Admitting: Licensed Clinical Social Worker

## 2020-01-22 ENCOUNTER — Encounter (HOSPITAL_COMMUNITY): Payer: Self-pay | Admitting: Psychiatry

## 2020-01-22 ENCOUNTER — Ambulatory Visit (INDEPENDENT_AMBULATORY_CARE_PROVIDER_SITE_OTHER): Payer: No Payment, Other | Admitting: Psychiatry

## 2020-01-22 ENCOUNTER — Other Ambulatory Visit: Payer: Self-pay

## 2020-01-22 DIAGNOSIS — F1220 Cannabis dependence, uncomplicated: Secondary | ICD-10-CM

## 2020-01-22 DIAGNOSIS — F250 Schizoaffective disorder, bipolar type: Secondary | ICD-10-CM

## 2020-01-22 MED ORDER — BREXPIPRAZOLE 1 MG PO TABS: ORAL_TABLET | ORAL | 0 refills | Status: AC

## 2020-01-22 MED ORDER — DIVALPROEX SODIUM 500 MG PO DR TAB: 500.0000 mg | DELAYED_RELEASE_TABLET | Freq: Two times a day (BID) | ORAL | 1 refills | Status: DC

## 2020-01-22 MED ORDER — QUETIAPINE FUMARATE 300 MG PO TABS: 600.0000 mg | ORAL_TABLET | Freq: Every day | ORAL | 1 refills | Status: DC

## 2020-01-22 MED ORDER — HYDROXYZINE HCL 25 MG PO TABS: 25.0000 mg | ORAL_TABLET | Freq: Three times a day (TID) | ORAL | 1 refills | Status: DC | PRN

## 2020-01-22 MED ORDER — TRAZODONE HCL 50 MG PO TABS: 50.0000 mg | ORAL_TABLET | Freq: Every evening | ORAL | 1 refills | Status: DC | PRN

## 2020-01-22 MED ORDER — BUPROPION HCL ER (XL) 150 MG PO TB24: 150.0000 mg | ORAL_TABLET | Freq: Every day | ORAL | 1 refills | Status: DC

## 2020-01-22 NOTE — Progress Notes (Signed)
Psychiatric Initial Adult Assessment   Patient Identification: Timothy [name] MRN:  [id number] Date of Evaluation:  01/22/2020   Referral Source: [facility]  Chief Complaint:   " I am doing good."  Visit Diagnosis:    ICD-10-CM   1. Schizoaffective disorder, bipolar type (HCC)  F25.0   2. Cannabis use disorder, moderate, dependence (HCC)  F12.20     History of Present Illness: This is a 24 year old male with history of schizoaffective disorder versus schizophrenia now seen for establishing care.  Patient has history of numerous psychiatric hospitalizations.  He has a longstanding history of psychosis and also has history of suicidal attempts in the past. He was most recently seen in the ED on June 22 and was discharged on the same day.  His most recent psychiatry admission was from June 13 to December 21, 2019 when he was admitted to [facility] for escalating psychosis and suicidal and homicidal ideations.  Prior to that he was admitted in May 2021 at [facility] for similar clinical complaints. He was discharged on Wellbutrin XL 150 mg every morning, Depakote 500 mg twice daily, Seroquel 600 mg at bedtime, hydroxyzine 25 mg 3 times daily as needed, trazodone 50 mg at bedtime as needed in June.  Today, patient was noted to be well put together and was carrying a backpack.  He stated that his grandmother provided transportation today.  He stated that he has been taking his medications regularly and they do seem to help him somewhat.  He stated that he still continues to have auditory hallucinations and sometimes visual hallucinations which sometimes bother him.  He informed that he also has paranoid ideations at times and also continues to have ideas of reference.  He stated that the current combination of medicines is helpful and he does not mind continuing the same combination for now.  He stated that he really wants to stay on his meds as that is helpful to him. Of note, he is allergic to Haldol, Abilify,  olanzapine.  He denied any suicidal or homicidal ideations today.  He denied any active symptom suggestive of mania or hypomania.  He denied anhedonia or any other symptom suggestive of depression. He stated that he is able to sleep fairly well and uses trazodone only as needed.  He stated that he goes to bed around 9 or 10 PM and wakes up around 7 or 8 AM.  He stated that he lives by himself and is able to manage his basic bills however sometimes struggles financially.  During the daytime he helps his uncle take care of his elderly grandmother.  He stated that he is looking for another job so that he can manage all his bills properly.  He denied any other acute issues or concerns at this time.  He stated that he started going back and forth to the hospitals and would like to stay on current medications.  He stated that he still smokes marijuana on and off and that it helps to keep him calm.   Past Psychiatric History: Schizophrenia versus schizoaffective disorder.  Has history of numerous psychiatric hospitalizations in the past.  Also has history of cannabis use disorder.  Previous Psychotropic Medications: Yes   Substance Abuse History in the last 12 months:  Yes.    Consequences of Substance Abuse: Negative  Past Medical History:  Past Medical History:  Diagnosis Date  . Cannabis abuse   . Paranoid schizophrenia (HCC)   . Schizoaffective disorder, depressive type (HCC)  09/17/2014   No past surgical history on file.  Family Psychiatric History: see below  Family History:  Family History  Problem Relation Age of Onset  . Mental illness Brother   . Drug abuse Brother   . Mental illness Cousin   . Suicidality Cousin   . Diabetes Maternal Grandmother     Social History:   Social History   Socioeconomic History  . Marital status: Single    Spouse name: Not on file  . Number of children: Not on file  . Years of education: Not on file  . Highest education level: Not on  file  Occupational History  . Not on file  Tobacco Use  . Smoking status: Current Some Day Smoker    Packs/day: 0.25  . Smokeless tobacco: Never Used  Vaping Use  . Vaping Use: Never used  Substance and Sexual Activity  . Alcohol use: Yes    Alcohol/week: 4.0 - 6.0 standard drinks    Types: 4 - 6 Shots of liquor per week  . Drug use: Yes    Frequency: 21.0 times per week    Types: Marijuana  . Sexual activity: Yes    Birth control/protection: Condom, Rhythm  Other Topics Concern  . Not on file  Social History Narrative  . Not on file   Social Determinants of Health   Financial Resource Strain: Low Risk   . Difficulty of Paying Living Expenses: Not hard at all  Food Insecurity: No Food Insecurity  . Worried About Programme researcher, broadcasting/film/video in the Last Year: Never true  . Ran Out of Food in the Last Year: Never true  Transportation Needs: No Transportation Needs  . Lack of Transportation (Medical): No  . Lack of Transportation (Non-Medical): No  Physical Activity: Inactive  . Days of Exercise per Week: 0 days  . Minutes of Exercise per Session: 0 min  Stress: Stress Concern Present  . Feeling of Stress : Rather much  Social Connections: Moderately Isolated  . Frequency of Communication with Friends and Family: Once a week  . Frequency of Social Gatherings with Friends and Family: Once a week  . Attends Religious Services: More than 4 times per year  . Active Member of Clubs or Organizations: Yes  . Attends Banker Meetings: More than 4 times per year  . Marital Status: Never married    Additional Social History: Lives by himself, currently unemployed and is looking for a part-time job  Allergies:   Allergies  Allergen Reactions  . Haldol [Haloperidol Lactate] Other (See Comments)    Muscle stiffness  . Abilify [Aripiprazole]     eps  . Penicillins Hives    Felt like throat was closing Has patient had a PCN reaction causing immediate rash,  facial/tongue/throat swelling, SOB or lightheadedness with hypotension: Unknown Has patient had a PCN reaction causing severe rash involving mucus membranes or skin necrosis: unknown Has patient had a PCN reaction that required hospitalization Unknown Has patient had a PCN reaction occurring within the last 10 years: Unknown If all of the above answers are "NO", then may proceed with Cephalosporin use.   . Zyprexa [Olanzapine]     eps    Metabolic Disorder Labs: Lab Results  Component Value Date   HGBA1C 5.4 08/15/2016   MPG 108 08/15/2016   MPG 123 11/26/2015   Lab Results  Component Value Date   PROLACTIN 33.2 (H) 08/15/2016   Lab Results  Component Value Date   CHOL 163  11/11/2019   TRIG 81 11/11/2019   HDL 39 (L) 11/11/2019   CHOLHDL 4.2 11/11/2019   VLDL 16 11/11/2019   LDLCALC 108 (H) 11/11/2019   LDLCALC 95 08/15/2016   Lab Results  Component Value Date   TSH 1.155 11/11/2019    Therapeutic Level Labs: No results found for: LITHIUM No results found for: CBMZ Lab Results  Component Value Date   VALPROATE 84 12/21/2019    Current Medications: Current Outpatient Medications  Medication Sig Dispense Refill  . buPROPion (WELLBUTRIN XL) 150 MG 24 hr tablet Take 1 tablet (150 mg total) by mouth daily. For depression 30 tablet 0  . divalproex (DEPAKOTE) 500 MG DR tablet Take 1 tablet (500 mg total) by mouth every 12 (twelve) hours. For mood stabilization 60 tablet 0  . hydrOXYzine (ATARAX/VISTARIL) 25 MG tablet Take 1 tablet (25 mg total) by mouth 3 (three) times daily as needed for anxiety. 75 tablet 0  . QUEtiapine (SEROQUEL) 300 MG tablet Take 2 tablets (600 mg total) by mouth at bedtime. For mood control 60 tablet 0  . traZODone (DESYREL) 50 MG tablet Take 1 tablet (50 mg total) by mouth at bedtime as needed for sleep. 30 tablet 0   No current facility-administered medications for this visit.    Musculoskeletal: Strength & Muscle Tone: within normal  limits Gait & Station: normal Patient leans: N/A  Psychiatric Specialty Exam: Review of Systems  There were no vitals taken for this visit.There is no height or weight on file to calculate BMI.  General Appearance: Fairly Groomed  Eye Contact:  Good  Speech:  Clear and Coherent and Normal Rate  Volume:  Normal  Mood:  Euthymic  Affect:  Restricted  Thought Process:  Goal Directed and Descriptions of Associations: Intact  Orientation:  Full (Time, Place, and Person)  Thought Content:  Logical, Hallucinations: Auditory and Paranoid Ideation  Suicidal Thoughts:  No  Homicidal Thoughts:  No  Memory:  Immediate;   Good Recent;   Good  Judgement:  Fair  Insight:  Fair  Psychomotor Activity:  Normal  Concentration:  Concentration: Good and Attention Span: Good  Recall:  Good  Fund of Knowledge:Good  Language: Good  Akathisia:  Negative  Handed:  Right  AIMS (if indicated):  1  Assets:  Communication Skills Desire for Improvement Financial Resources/Insurance Housing Social Support Transportation  ADL's:  Intact  Cognition: WNL  Sleep:  Fair   Screenings: AIMS     [facility]Discharged[facility]10/2019 in [facility] Ad[facility]) [facility]) [facility]  AIMS Total Score 0 0 0 0 0    AUDIT     [facility]) [facility] Ad[facility] (Discharged) from 11/10/2019 in [facility] Admission (Discharged) [facility] (Dis[facility]  Alcohol Use Disorder Identification Test Final Score (AUDIT) 8 2 12 1 2     PHQ2-9     Counselor from 12/31/2019 in [facility]  PHQ-2 Total Score 3  PHQ-9 Total Score 14      Assessment and Plan: 24 year old male with  schizoaffective disorder, cannabis abuse and history of numerous psychiatric admissions in the past seen for establishing care.  [facility].  Patient still has ongoing auditory and visual hallucinations as well as paranoid ideations.  He stated that Seroquel is helpful but not completely.  He is agreeable to adding Rexulti to target his psychotic symptoms to his regimen.  He was prescribed Rexulti starter pack sample 1 mg for 1 week and then to increase to 2 mg following that.  Potential side effects of medication and risks vs benefits of treatment vs non-treatment were explained and discussed. All questions were answered.  1. Schizoaffective disorder, bipolar type (HCC)  - buPROPion (WELLBUTRIN XL) 150 MG 24 hr tablet; Take 1 tablet (150 mg total) by mouth daily. For depression  Dispense: 30 tablet; Refill: 1 - divalproex (DEPAKOTE) 500 MG DR tablet; Take 1 tablet (500 mg total) by mouth 2 (two) times daily. For mood stabilization  Dispense: 60 tablet; Refill: 1 - hydrOXYzine (ATARAX/VISTARIL) 25 MG tablet; Take 1 tablet (25 mg total) by mouth 3 (three) times daily as needed for anxiety.  Dispense: 75 tablet; Refill: 1 - QUEtiapine (SEROQUEL) 300 MG tablet; Take 2 tablets (600 mg total) by mouth at bedtime. For mood control  Dispense: 60 tablet; Refill: 1 - traZODone (DESYREL) 50 MG tablet; Take 1 tablet (50 mg total) by mouth at bedtime as needed for sleep.  Dispense: 30 tablet; Refill: 1 -Start brexpiprazole (REXULTI) 1 MG TABS tablet; Take 1 tablet (1 mg total) by mouth daily for 7 days, THEN 2 tablets (2 mg total) daily for 7 days.  Dispense: 30 tablet;  Refill: 0  2. Cannabis use disorder, moderate, dependence (HCC) -Patient was advised to limit his use.  Patient was scheduled to return back in about 2 weeks to see the therapist and was advised to give feedback to the therapist regarding how he did on Rexulti. Based on the feedback we will send a prescription to the pharmacy.  Follow-up with the writer in a month.    Zena AmosMandeep Zyanne Schumm, MD 7/20/20219:12 AM

## 2020-02-05 ENCOUNTER — Ambulatory Visit (INDEPENDENT_AMBULATORY_CARE_PROVIDER_SITE_OTHER): Payer: No Payment, Other | Admitting: Licensed Clinical Social Worker

## 2020-02-05 ENCOUNTER — Other Ambulatory Visit: Payer: Self-pay

## 2020-02-05 DIAGNOSIS — F250 Schizoaffective disorder, bipolar type: Secondary | ICD-10-CM | POA: Diagnosis not present

## 2020-02-05 NOTE — Patient Instructions (Addendum)
Managing Schizoaffective Disorder If you have been diagnosed with schizoaffective disorder (ScAD), you may be relieved to know why you have felt or behaved a certain way. You may also feel overwhelmed about the treatment ahead, how to get the support you need, and how to deal with the condition day-to-day. With care and support, you can learn to manage your symptoms and live with ScAD. ScAD is a chronic, lifelong condition that may occur in cycles. Periods of severe symptoms may be followed by periods of less severe symptoms or improvement. There are steps you can take to help manage ScAD and make your life better. How to manage lifestyle changes Managing stress Stress is your body's reaction to life changes and events, both good and bad. For people with ScAD, stress can cause more severe symptoms to start (can be a trigger), so it is important to learn ways to deal with stress. Your health care provider, therapist, or counselor may suggest techniques such as:  Meditation, muscle relaxation, and breathing exercises.  Music therapy. This can include creating music or listening to music.  Life skills training. This training is focused on work, self-care, money, house management, and social skills. Other things you can do to manage stress include:  Keeping a stress diary. This can help you learn what causes your stress to start and how you can control your response to those triggers.  Exercising. Even a short daily walk can help.  Getting enough sleep.  Making a schedule to manage your time. Knowing what you will do from day to day helps you avoid feeling overwhelmed by tasks and deadlines.  Spending time on hobbies you enjoy that help you relax.  Medicines Your health care provider is likely to prescribe various types of medicine depending on your symptoms. These may include one or more of the following types:  Antipsychotics.  Mood stabilizers.  Antidepressants. Make sure you:  Talk  with your pharmacist or health care provider about all medicines that you take, the possible side effects, and which medicines are safe to take together.  Make it your goal to take part in all treatment decisions (shared decision-making). Ask about possible side effects of medicines that your health care provider recommends, and tell him or her how you feel about having those side effects. It is best if shared decision-making with your health care provider is part of your total treatment plan. Relationships Having the support of your family and friends can play a major role in the success of your treatment. The following steps can help you maintain healthy relationships:  Think about going to couples therapy, family therapy, or family education classes.  Create a written plan for your treatment, and include close family members and friends in the process.  Consider bringing your partner or another family member or friend to the appointments you have with your health care provider. How to recognize changes in your condition If you find that your condition is getting worse, talk to your health care provider right away. Watch for these signs:  Your mood becomes extreme with either emotional highs or the intense lows of depression.  Your speech becomes unclear.  You are disorganized, show the wrong social behaviors, or withdraw from social activities.  You have racing thoughts and have trouble thinking clearly or staying focused.  You hear, see, taste, and believe things that others do not.  You have poor personal hygiene, weight gain or weight loss, or changes in how you are sleeping or eating.  Follow these instructions at home:  Take over-the-counter and prescription medicines only as told by your health care provider. Do not start new medicines or stop taking medicines before you ask your health care provider if it is safe to make those changes.  Avoid caffeine, alcohol, and drugs. They  can affect how your medicine works and can make your symptoms worse.  Eat a healthy diet.  Look for support groups in your area so you can meet other people with your condition. You can learn new methods of managing ScAD by listening to others.  Keep all follow-up visits as told by your health care provider, therapist, or counselor. This is important. Where to find support Talking to others  Reach out to trusted friends or family members, explain your condition, and let them know that you are working with a health care team.  Consider giving educational materials to friends and family.  If you are having trouble telling your friends and family about your condition, keep in mind that honest and open communication can make these conversations easier. Finances Be sure to check with your insurance carrier to find out what treatment options are covered by your plan. You may also be able to find financial assistance through not-for-profit organizations or with local government-based resources. If you are taking medicines, you may be able to get the generic form, which may be less expensive than brand-name medicine. Some makers of prescription medicines also offer help to patients who cannot afford the medicines that they need. Therapy and support groups  Make sure you find a counselor or therapist who is familiar with ScAD. Meet with your counselor or therapist once a week or more often if needed.  Find support programs for people with ScAD. Where to find more information  [employer] on Mental Illness: www.nami.org Contact a health care provider if:  You are not able to take your medicines as prescribed.  Your symptoms get worse. Get help right away if:  You have serious thoughts about hurting yourself or others. If you ever feel like you may hurt yourself or others, or have thoughts about taking your own life, get help right away. You can go to your nearest emergency department or  call:  Your local emergency services (911 in the [place]  A suicide crisis helpline, such as the National Suicide Prevention Lifeline at 409 731 7951. This is open 24 hours a day. Summary  Schizoaffective disorder (ScAD) is a chronic, lifelong illness. It is best controlled with continuous treatment that includes medicine and therapy.  Learning ways to manage stress may help your treatment to work better.  Having the support of your family and friends can be a key to making your treatment a success.  If you find that your condition is getting worse, talk to your health care provider right away. This information is not intended to replace advice given to you by your health care provider. Make sure you discuss any questions you have with your health care provider. Document Revised: 10/13/2018 Document Reviewed: 10/21/2016 Elsevier Patient Education  2020 Elsevier Inc.  Suicidal Feelings: How to Help Yourself Suicide is when you end your own life. There are many things you can do to help yourself feel better when struggling with these feelings. Many services and people are available to support you and others who struggle with similar feelings.  If you ever feel like you may hurt yourself or others, or have thoughts about taking your own life, get help right away. To  get help:  Call your local emergency services (911 in the [place]  The [place] [address]'s health and human services helpline (211 in the [place]  Go to your nearest emergency department.  Call a suicide hotline to speak with a trained counselor. The following suicide hotlines are available in the [place]: ? 1-800-273-TALK (581)010-1126). ? 1-800-SUICIDE 781-216-3209). ? 772-003-6257. This is a hotline for Spanish speakers. ? 281-838-8719. This is a hotline for TTY users. ? 1-866-4-U-TREVOR (510)205-4369). This is a hotline for lesbian, gay, bisexual, transgender, or questioning youth. ? For a list of hotlines in  [place], visit ItCheaper.dk.html  Contact a crisis center or a local suicide prevention center. To find a crisis center or suicide prevention center: ? Call your local hospital, clinic, community service organization, mental health center, social service provider, or health department. Ask for help with connecting to a crisis center. ? For a list of crisis centers in the [place], visit: suicidepreventionlifeline.org ? For a list of crisis centers in [place], visit: suicideprevention.ca How to help yourself feel better   Promise yourself that you will not do anything extreme when you have suicidal feelings. Remember, there is hope. Many people have gotten through suicidal thoughts and feelings, and you can too. If you have had these feelings before, remind yourself that you can get through them again.  Let family, friends, teachers, or counselors know how you are feeling. Try not to separate yourself from those who care about you and want to help you. Talk with someone every day, even if you do not feel sociable. Face-to-face conversation is best to help them understand your feelings.  Contact a mental health care provider and work with this person regularly.  Make a safety plan that you can follow during a crisis. Include phone numbers of suicide prevention hotlines, mental health professionals, and trusted friends and family members you can call during an emergency. Save these numbers on your phone.  If you are thinking of taking a lot of medicine, give your medicine to someone who can give it to you as prescribed. If you are on antidepressants and are concerned you will overdose, tell your health care provider so that he or she can give you safer medicines.  Try to stick to your routines. Follow a schedule every day. Make self-care a priority.  Make a list of realistic goals, and cross them off when you achieve them. Accomplishments can  give you a sense of worth.  Wait until you are feeling better before doing things that you find difficult or unpleasant.  Do things that you have always enjoyed to take your mind off your feelings. Try reading a book, or listening to or playing music. Spending time outside, in nature, may help you feel better. Follow these instructions at home:   Visit your primary health care provider every year for a checkup.  Work with a mental health care provider as needed.  Eat a well-balanced diet, and eat regular meals.  Get plenty of rest.  Exercise if you are able. Just 30 minutes of exercise each day can help you feel better.  Take over-the-counter and prescription medicines only as told by your health care provider. Ask your mental health care provider about the possible side effects of any medicines you are taking.  Do not use alcohol or drugs, and remove these substances from your home.  Remove weapons, poisons, knives, and other deadly items from your home. General recommendations  Keep your living space well lit.  When you are feeling well, write yourself a letter with tips and support that you can read when you are not feeling well.  Remember that life's difficulties can be sorted out with help. Conditions can be treated, and you can learn behaviors and ways of thinking that will help you. Where to find more information  National Suicide Prevention Lifeline: www.suicidepreventionlifeline.org  Hopeline: www.hopeline.com  [employer] for Suicide Prevention: https://www.ayers.com/  [employer] (for lesbian, gay, bisexual, transgender, or questioning youth): www.thetrevorproject.org Contact a health care provider if:  You feel as though you are a burden to others.  You feel agitated, angry, vengeful, or have extreme mood swings.  You have withdrawn from family and friends. Get help right away if:  You are talking about suicide or wishing to die.  You start making  plans for how to commit suicide.  You feel that you have no reason to live.  You start making plans for putting your affairs in order, saying goodbye, or giving your possessions away.  You feel guilt, shame, or unbearable pain, and it seems like there is no way out.  You are frequently using drugs or alcohol.  You are engaging in risky behaviors that could lead to death. If you have any of these symptoms, get help right away. Call emergency services, go to your nearest emergency department or crisis center, or call a suicide crisis helpline. Summary  Suicide is when you take your own life.  Promise yourself that you will not do anything extreme when you have suicidal feelings.  Let family, friends, teachers, or counselors know how you are feeling.  Get help right away if you feel as though life is getting too tough to handle and you are thinking about suicide. This information is not intended to replace advice given to you by your health care provider. Make sure you discuss any questions you have with your health care provider. Document Revised: 10/12/2018 Document Reviewed: 02/01/2017 Elsevier Patient Education  2020 [employer].
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
## 2020-02-27 ENCOUNTER — Encounter (HOSPITAL_COMMUNITY): Payer: No Payment, Other | Admitting: Psychiatry

## 2020-02-29 ENCOUNTER — Encounter: Payer: Self-pay | Admitting: Emergency Medicine

## 2020-02-29 ENCOUNTER — Other Ambulatory Visit: Payer: Self-pay

## 2020-02-29 DIAGNOSIS — F1210 Cannabis abuse, uncomplicated: Secondary | ICD-10-CM | POA: Insufficient documentation

## 2020-02-29 DIAGNOSIS — Y907 Blood alcohol level of 200-239 mg/100 ml: Secondary | ICD-10-CM | POA: Insufficient documentation

## 2020-02-29 DIAGNOSIS — F329 Major depressive disorder, single episode, unspecified: Secondary | ICD-10-CM | POA: Insufficient documentation

## 2020-02-29 DIAGNOSIS — F600 Paranoid personality disorder: Secondary | ICD-10-CM | POA: Insufficient documentation

## 2020-02-29 DIAGNOSIS — F17210 Nicotine dependence, cigarettes, uncomplicated: Secondary | ICD-10-CM | POA: Insufficient documentation

## 2020-02-29 DIAGNOSIS — F259 Schizoaffective disorder, unspecified: Secondary | ICD-10-CM | POA: Insufficient documentation

## 2020-02-29 DIAGNOSIS — Z20822 Contact with and (suspected) exposure to covid-19: Secondary | ICD-10-CM | POA: Insufficient documentation

## 2020-02-29 LAB — CBC
HCT: 44 % (ref 39.0–52.0)
Hemoglobin: 15.2 g/dL (ref 13.0–17.0)
MCH: 30.3 pg (ref 26.0–34.0)
MCHC: 34.5 g/dL (ref 30.0–36.0)
MCV: 87.8 fL (ref 80.0–100.0)
Platelets: 233 10*3/uL (ref 150–400)
RBC: 5.01 MIL/uL (ref 4.22–5.81)
RDW: 12.9 % (ref 11.5–15.5)
WBC: 5.4 10*3/uL (ref 4.0–10.5)
nRBC: 0 % (ref 0.0–0.2)

## 2020-02-29 NOTE — ED Notes (Signed)
Patient changed into hospital scrubs by this RN and Beth EDT. Patient belongings placed in a belonging bag. Patient has a t-shirt, shorts, underwear, shoes and socks.

## 2020-02-29 NOTE — ED Triage Notes (Signed)
Patient states that he wants to speak to the psychologist. Patient states that it is scary out there.
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
## 2020-03-01 ENCOUNTER — Emergency Department
Admission: EM | Admit: 2020-03-01 | Discharge: 2020-03-02 | Disposition: A | Discharge status: Home / Self Care | Payer: Medicaid Other | Attending: Emergency Medicine | Admitting: Emergency Medicine

## 2020-03-01 DIAGNOSIS — F251 Schizoaffective disorder, depressive type: Secondary | ICD-10-CM

## 2020-03-01 DIAGNOSIS — F250 Schizoaffective disorder, bipolar type: Secondary | ICD-10-CM | POA: Diagnosis present

## 2020-03-01 LAB — COMPREHENSIVE METABOLIC PANEL
ALT: 20 U/L (ref 0–44)
AST: 24 U/L (ref 15–41)
Albumin: 4.5 g/dL (ref 3.5–5.0)
Alkaline Phosphatase: 39 U/L (ref 38–126)
Anion gap: 8 (ref 5–15)
BUN: 13 mg/dL (ref 6–20)
CO2: 26 mmol/L (ref 22–32)
Calcium: 9.1 mg/dL (ref 8.9–10.3)
Chloride: 103 mmol/L (ref 98–111)
Creatinine, Ser: 1.23 mg/dL (ref 0.61–1.24)
GFR calc Af Amer: >60 mL/min (ref >60)
GFR calc non Af Amer: >60 mL/min (ref >60)
Glucose, Bld: 110 mg/dL — ABNORMAL HIGH (ref 70–99)
Potassium: 3.5 mmol/L (ref 3.5–5.1)
Sodium: 137 mmol/L (ref 135–145)
Total Bilirubin: 1.7 mg/dL — ABNORMAL HIGH (ref 0.3–1.2)
Total Protein: 8 g/dL (ref 6.5–8.1)

## 2020-03-01 LAB — SALICYLATE LEVEL: Salicylate Lvl: <7 mg/dL — ABNORMAL LOW (ref 7.0–30.0)

## 2020-03-01 LAB — SARS CORONAVIRUS 2 BY RT PCR (HOSPITAL ORDER, PERFORMED IN ~~LOC~~ HOSPITAL LAB): SARS Coronavirus 2: NEGATIVE

## 2020-03-01 LAB — ETHANOL: Alcohol, Ethyl (B): <10 mg/dL (ref <10)

## 2020-03-01 LAB — ACETAMINOPHEN LEVEL: Acetaminophen (Tylenol), Serum: <10 ug/mL — ABNORMAL LOW (ref 10–30)

## 2020-03-01 MED ORDER — QUETIAPINE FUMARATE 200 MG PO TABS
600.0000 mg | ORAL_TABLET | Freq: Every day | ORAL | Status: DC
  Administered 2020-03-01: 600 mg via ORAL
  Filled 2020-03-01: qty 3

## 2020-03-01 MED ORDER — HYDROXYZINE HCL 25 MG PO TABS
25.0000 mg | ORAL_TABLET | Freq: Three times a day (TID) | ORAL | Status: DC | PRN
  Administered 2020-03-01: 25 mg via ORAL
  Filled 2020-03-01: qty 1

## 2020-03-01 MED ORDER — DIVALPROEX SODIUM 500 MG PO DR TAB
500.0000 mg | DELAYED_RELEASE_TABLET | Freq: Two times a day (BID) | ORAL | Status: DC
  Administered 2020-03-01 – 2020-03-02 (×2): 500 mg via ORAL
  Filled 2020-03-01 (×2): qty 1

## 2020-03-01 MED ORDER — TRAZODONE HCL 50 MG PO TABS
50.0000 mg | ORAL_TABLET | Freq: Every evening | ORAL | Status: DC | PRN
  Administered 2020-03-01: 50 mg via ORAL
  Filled 2020-03-01: qty 1

## 2020-03-01 MED ORDER — BUPROPION HCL ER (XL) 150 MG PO TB24
150.0000 mg | ORAL_TABLET | Freq: Every day | ORAL | Status: DC
  Administered 2020-03-02: 150 mg via ORAL
  Filled 2020-03-01: qty 1

## 2020-03-01 NOTE — ED Notes (Signed)
Hourly rounding reveals patient sleeping in room. No complaints, stable, in no acute distress. Q15 minute rounds and monitoring via Rover and Officer to continue.  

## 2020-03-01 NOTE — ED Notes (Signed)
Pt transferred into ED BHU room 3   Patient assigned to appropriate care area. Patient oriented to unit/care area: Informed that, for their safety, care areas are designed for safety and monitored by security cameras at all times; Visiting hours and phone times explained to patient. Patient verbalizes understanding, and verbal contract for safety obtained.   Assessment completed  He denies pain   

## 2020-03-01 NOTE — ED Notes (Signed)
Assumed care of patient patient sleeping comfortably, will assess upon awakening. As per prior nurse patient was calm and cooperative upon arrival. Awaiting for plan of care. Safety maintained.

## 2020-03-01 NOTE — ED Notes (Signed)
Patient sleeping comfortably, breakfast provided.

## 2020-03-01 NOTE — Consult Note (Signed)
[facility] Face-to-Face Psychiatry Consult   Reason for Consult:  Psych evaluation  Referring Physician: Dr. Dolores Frame  Patient Identification: Timothy [name] MRN:  [id number] Principal Diagnosis: Schizoaffective disorder, bipolar type (HCC) Diagnosis:  Principal Problem:   Schizoaffective disorder, bipolar type (HCC)   Total Time spent with patient: 45 minutes  Subjective:   Timothy [name] is a 24 y.o. male patient admitted to [facility] under ivc for bizarre behavior.  HPI:  Timothy [name], 24 y.o., male patient seen face to face by  this provider; and chart reviewed.   On evaluation KAHLE MCQUEEN reports that "its scary outside. HE admits to not taking his medication as prescribed.  He doesn't mention the events that has brought him t the hospital.  He says he here because it scary outside.  He says he has thoughts about killing himself and that he might do it.  He also states that he will kill someone else too, and states no one in particular.  He says every outside looks alike.  During evaluation Timothy [name] is laying in bed with no shirt on; he is alert/oriented x 3 bizarre/cooperative; and mood congruent with affect.  Patient is speaking in a clear tone at moderate volume, and normal pace; with fair eye contact.  His thought process is incoherent and irrelevant; There is no indication that he is currently responding to internal/external stimuli, but it is evident that he is experiencing delusional thought content.  Patient endorses suicidal/self-harm/homicidal ideation, psychosis, and paranoia.    Past Psychiatric History: schizoaffective disorder  Risk to Self:   Risk to Others:   Prior Inpatient Therapy:   Prior Outpatient Therapy:    Past Medical History:  Past Medical History:  Diagnosis Date  . Cannabis abuse   . Paranoid schizophrenia (HCC)   . Schizoaffective disorder, depressive type (HCC) 09/17/2014   History reviewed. No pertinent surgical history. Family History:  Family History   Problem Relation Age of Onset  . Mental illness Brother   . Drug abuse Brother   . Mental illness Cousin   . Suicidality Cousin   . Diabetes Maternal Grandmother    Family Psychiatric  History: unknown Social History:  Social History   Substance and Sexual Activity  Alcohol Use Yes     Social History   Substance and Sexual Activity  Drug Use Yes  . Types: Marijuana    Social History   Socioeconomic History  . Marital status: Single    Spouse name: Not on file  . Number of children: Not on file  . Years of education: Not on file  . Highest education level: Not on file  Occupational History  . Not on file  Tobacco Use  . Smoking status: Current Some Day Smoker    Packs/day: 0.25  . Smokeless tobacco: Never Used  Vaping Use  . Vaping Use: Never used  Substance and Sexual Activity  . Alcohol use: Yes  . Drug use: Yes    Types: Marijuana  . Sexual activity: Yes  Other Topics Concern  . Not on file  Social History Narrative  . Not on file   Social Determinants of Health   Financial Resource Strain: Low Risk   . Difficulty of Paying Living Expenses: Not hard at all  Food Insecurity: No Food Insecurity  . Worried About Programme researcher, broadcasting/film/video in the Last Year: Never true  . Ran Out of Food in the Last Year: Never true  Transportation Needs: No Transportation Needs  .  Lack of Transportation (Medical): No  . Lack of Transportation (Non-Medical): No  Physical Activity: Inactive  . Days of Exercise per Week: 0 days  . Minutes of Exercise per Session: 0 min  Stress: Stress Concern Present  . Feeling of Stress : Rather much  Social Connections: Moderately Isolated  . Frequency of Communication with Friends and Family: Once a week  . Frequency of Social Gatherings with Friends and Family: Once a week  . Attends Religious Services: More than 4 times per year  . Active Member of Clubs or Organizations: Yes  . Attends Banker Meetings: More than 4 times per  year  . Marital Status: Never married   Additional Social History:    Allergies:   Allergies  Allergen Reactions  . Haldol [Haloperidol Lactate] Other (See Comments)    Muscle stiffness  . Abilify [Aripiprazole]     eps  . Penicillins Hives    Felt like throat was closing Has patient had a PCN reaction causing immediate rash, facial/tongue/throat swelling, SOB or lightheadedness with hypotension: Unknown Has patient had a PCN reaction causing severe rash involving mucus membranes or skin necrosis: unknown Has patient had a PCN reaction that required hospitalization Unknown Has patient had a PCN reaction occurring within the last 10 years: Unknown If all of the above answers are "NO", then may proceed with Cephalosporin use.   . Zyprexa [Olanzapine]     eps    Labs:  Results for orders placed or performed during the hospital encounter of 03/01/20 (from the past 48 hour(s))  Comprehensive metabolic panel     Status: Abnormal   Collection Time: 02/29/20 11:41 PM  Result Value Ref Range   Sodium 137 135 - 145 mmol/L   Potassium 3.5 3.5 - 5.1 mmol/L   Chloride 103 98 - 111 mmol/L   CO2 26 22 - 32 mmol/L   Glucose, Bld 110 (H) 70 - 99 mg/dL    Comment: Glucose reference range applies only to samples taken after fasting for at least 8 hours.   BUN 13 6 - 20 mg/dL   Creatinine, Ser 7.41 0.61 - 1.24 mg/dL   Calcium 9.1 8.9 - 28.7 mg/dL   Total Protein 8.0 6.5 - 8.1 g/dL   Albumin 4.5 3.5 - 5.0 g/dL   AST 24 15 - 41 U/L   ALT 20 0 - 44 U/L   Alkaline Phosphatase 39 38 - 126 U/L   Total Bilirubin 1.7 (H) 0.3 - 1.2 mg/dL   GFR calc non Af Amer >60 >60 mL/min   GFR calc Af Amer >60 >60 mL/min   Anion gap 8 5 - 15    Comment: Performed at [facility], [address] [place], [place] [address]  Ethanol     Status: None   Collection Time: 02/29/20 11:41 PM  Result Value Ref Range   Alcohol, Ethyl (B) <10 <10 mg/dL    Comment: (NOTE) Lowest detectable limit for serum  alcohol is 10 mg/dL.  For medical purposes only. Performed at [facility], [address] [place], [place] [address]   Salicylate level     Status: Abnormal   Collection Time: 02/29/20 11:41 PM  Result Value Ref Range   Salicylate Lvl <7.0 (L) 7.0 - 30.0 mg/dL    Comment: Performed at [facility], [address], [place], [place] [address]  Acetaminophen level     Status: Abnormal   Collection Time: 02/29/20 11:41 PM  Result Value Ref Range  Acetaminophen (Tylenol), Serum <10 (L) 10 - 30 ug/mL    Comment: (NOTE) Therapeutic concentrations vary significantly. A range of 10-30 ug/mL  may be an effective concentration for many patients. However, some  are best treated at concentrations outside of this range. Acetaminophen concentrations >150 ug/mL at 4 hours after ingestion  and >50 ug/mL at 12 hours after ingestion are often associated with  toxic reactions.  Performed at [facility], [address] [place], [place] [address]   cbc     Status: None   Collection Time: 02/29/20 11:41 PM  Result Value Ref Range   WBC 5.4 4.0 - 10.5 K/uL   RBC 5.01 4.22 - 5.81 MIL/uL   Hemoglobin 15.2 13.0 - 17.0 g/dL   HCT 86.5 39 - 52 %   MCV 87.8 80.0 - 100.0 fL   MCH 30.3 26.0 - 34.0 pg   MCHC 34.5 30.0 - 36.0 g/dL   RDW 78.4 69.6 - 29.5 %   Platelets 233 150 - 400 K/uL   nRBC 0.0 0.0 - 0.2 %    Comment: Performed at [facility], [address] [place], [place] [address]    No current facility-administered medications for this encounter.   Current Outpatient Medications  Medication Sig Dispense Refill  . buPROPion (WELLBUTRIN XL) 150 MG 24 hr tablet Take 1 tablet (150 mg total) by mouth daily. For depression 30 tablet 1  . divalproex (DEPAKOTE) 500 MG DR tablet Take 1 tablet (500 mg total) by mouth 2 (two) times daily. For mood stabilization 60 tablet 1  . hydrOXYzine (ATARAX/VISTARIL) 25 MG tablet Take 1 tablet (25 mg total) by mouth 3  (three) times daily as needed for anxiety. 75 tablet 1  . QUEtiapine (SEROQUEL) 300 MG tablet Take 2 tablets (600 mg total) by mouth at bedtime. For mood control 60 tablet 1  . traZODone (DESYREL) 50 MG tablet Take 1 tablet (50 mg total) by mouth at bedtime as needed for sleep. 30 tablet 1    Musculoskeletal: Strength & Muscle Tone: within normal limits Gait & Station: normal Patient leans: N/A  Psychiatric Specialty Exam: Physical Exam  Review of Systems  Blood pressure (!) 143/73, pulse (!) 56, temperature 98.4 F (36.9 C), temperature source Oral, resp. rate 18, weight 81.6 kg, SpO2 99 %.Body mass index is 21.91 kg/m.  General Appearance: Bizarre  Eye Contact:  Minimal  Speech:  Blocked  Volume:  Normal  Mood:  Anxious and Dysphoric  Affect:  Congruent  Thought Process:  Disorganized and Descriptions of Associations: Tangential  Orientation:  Full (Time, Place, and Person)  Thought Content:  Illogical  Suicidal Thoughts:  Yes.  without intent/plan  Homicidal Thoughts:  Yes.  without intent/plan  Memory:  Immediate;   Fair  Judgement:  Impaired  Insight:  Lacking  Psychomotor Activity:  Normal  Concentration:  Attention Span: Fair  Recall:  [employer] of Knowledge:  Poor  Language:  Fair  Akathisia:  NA  Handed:  Right  AIMS (if indicated):     Assets:  Desire for Improvement  ADL's:  Intact  Cognition:  Impaired,  Mild  Sleep:        Treatment Plan Summary: Daily contact with patient to assess and evaluate symptoms and progress in treatment and Medication management  Disposition: Recommend psychiatric Inpatient admission when medically cleared. Supportive therapy provided about ongoing stressors.  Jearld Lesch, NP 03/01/2020 4:46 AM

## 2020-03-01 NOTE — ED Notes (Signed)
Pt given snack try at this time   
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
## 2020-03-01 NOTE — ED Notes (Signed)
Pt. Transferred from Triage to room 23 after dressing out and screening for contraband. Pt. Oriented to Quad including Q15 minute rounds as well as Psychologist, counselling for their protection. Patient is alert and oriented, warm and dry in no acute distress. Patient denies SI, HI, but does state he has non command AH and sees"images". Pt. Encouraged to let me know if needs arise.

## 2020-03-01 NOTE — ED Notes (Signed)
Patient walking around his room naked.

## 2020-03-01 NOTE — ED Notes (Signed)
Patient swabbed for covid and transferred to [facility] 2

## 2020-03-01 NOTE — ED Notes (Signed)
Patient evaluated by TTS and psych this morning. Plan pending.

## 2020-03-02 DIAGNOSIS — F250 Schizoaffective disorder, bipolar type: Secondary | ICD-10-CM

## 2020-03-02 MED ORDER — DIVALPROEX SODIUM 500 MG PO DR TAB: 500.0000 mg | DELAYED_RELEASE_TABLET | Freq: Two times a day (BID) | ORAL | 1 refills | Status: DC

## 2020-03-02 MED ORDER — TRAZODONE HCL 50 MG PO TABS: 50.0000 mg | ORAL_TABLET | Freq: Every evening | ORAL | 1 refills | Status: DC | PRN

## 2020-03-02 MED ORDER — QUETIAPINE FUMARATE 300 MG PO TABS: 600.0000 mg | ORAL_TABLET | Freq: Every day | ORAL | 1 refills | Status: DC

## 2020-03-02 MED ORDER — BUPROPION HCL ER (XL) 150 MG PO TB24: 150.0000 mg | ORAL_TABLET | Freq: Every day | ORAL | 1 refills | Status: DC

## 2020-03-02 MED ORDER — HYDROXYZINE HCL 25 MG PO TABS: 25.0000 mg | ORAL_TABLET | Freq: Three times a day (TID) | ORAL | 1 refills | Status: DC | PRN

## 2020-03-02 NOTE — ED Notes (Signed)
Pt given meal tray.

## 2020-03-02 NOTE — ED Provider Notes (Signed)
Emergency Medicine Observation Re-evaluation Note  Timothy [name] is a 24 y.o. male, seen on rounds today.  Pt initially presented to the ED for complaints of Psychiatric Evaluation Currently, the patient is resting calmly.  Physical Exam  BP 116/66 (BP Location: Right Arm)   Pulse 80   Temp 98.5 F (36.9 C) (Oral)   Resp 18   Wt 81.6 kg   SpO2 99%   BMI 21.91 kg/m  Physical Exam Vitals and nursing note reviewed.  HENT:     Head: Normocephalic and atraumatic.     Right Ear: External ear normal.     Left Ear: External ear normal.     Nose: Nose normal.  Cardiovascular:     Rate and Rhythm: Normal rate.  Pulmonary:     Effort: No respiratory distress.  Neurological:     Mental Status: He is alert and oriented to person, place, and time.  Psychiatric:        Mood and Affect: Mood normal.     ED Course / MDM  EKG:  Clinical Course as of Mar 03 735  Sat Mar 01, 2020  2355 Patient was evaluated by psychiatric NP who recommends admission.   [JS]    Clinical Course User Index [JS] Irean Hong, MD   I have reviewed the labs performed to date as well as medications administered while in observation.  Recent changes in the last 24 hours include none.  Plan  Current plan is for pending formal psych recs. Will likely d/c home per discussion with psych NP3. Patient is under full IVC at this time.   Gilles Chiquito, MD 03/02/20 1004

## 2020-03-02 NOTE — BH Assessment (Signed)
Late Entry  Spoke with patient to complete consult.  Obtained collateral information from patient's mother.  Develop safety plan and discussed treatment options/plans for follow-up upon discharge from ER.

## 2020-03-02 NOTE — Discharge Instructions (Signed)
[facility] [employer] (Mental Health & Substance Use Services) & [facility]  [place] Address: [address], [place], [place] [address] Hours:  Closed ? Ricky [place] Mon Phone: [phone]  [facility]  MENTAL HEALTH EMERGENCY INFORMATION FOR THOSE SEEKING HELP RIGHT AWAY.   Call [phone] for assistance with any of our services Call 911 if you are experiencing a medical emergency Patient wearing a blue shirt and jeans speaking with a doctor in a white lab coat  OUTPATIENT SERVICES We also offer the following outpatient services:  Individual Therapy Partial Hospitalization Program (PHP) Substance Abuse Intensive Outpatient Program (SAIOP) Specialized Intensive Adult Group Therapy Medication Management Peer Living Room VIRTUAL TOUR Take a virtual tour of the new [facility].  Phone: [phone] Address: [address] [place], [place] [address] Hours: Open 24/7, No appointment required.

## 2020-03-02 NOTE — ED Notes (Signed)
Pt's grandmother coming to pick up pt in about an hour.

## 2020-03-02 NOTE — ED Notes (Signed)
Pt given belongings bag 1/1 back and allowed to change back ito clothing. Grandmother out front to pick pt up.

## 2020-03-02 NOTE — ED Provider Notes (Signed)
Patient seen by psychiatry service and cleared for discharge.  Discharged stable condition.   Gilles Chiquito, MD 03/02/20 1043

## 2020-03-02 NOTE — Consult Note (Signed)
[facility] Psych ED Discharge  03/02/2020 10:34 AM [name]  MRN:  [id number] Principal Problem: Schizoaffective disorder, bipolar type [facility]) Discharge Diagnoses: Principal Problem:   Schizoaffective disorder, bipolar type (HCC)  Subjective: "I'm good."  Patient seen and evaluated in person by this provider.  Client kept overnight to continue to assess for stability.  Denies suicidal/homicidal ideations, hallucinations, mania, and other psychiatric concerns.  With his permission his mother was contacted and she had no safety concerns and was surprised to hear that he was at the hospital.  Client feels safe returning home with no issues noted and will follow up with his regular outpatient provider to continue his care.  HPI per NP Dixon:  Timothy [name], 24 y.o., male patient seen face to face by  this provider; and chart reviewed.   On evaluation Timothy [name] reports that "its scary outside. HE admits to not taking his medication as prescribed.  He doesn't mention the events that has brought him t the hospital.  He says he here because it scary outside.  He says he has thoughts about killing himself and that he might do it.  He also states that he will kill someone else too, and states no one in particular.  He says every outside looks alike.  During evaluation Timothy [name] is laying in bed with no shirt on; he is alert/oriented x 3 bizarre/cooperative; and mood congruent with affect.  Patient is speaking in a clear tone at moderate volume, and normal pace; with fair eye contact.  His thought process is incoherent and irrelevant; There is no indication that he is currently responding to internal/external stimuli, but it is evident that he is experiencing delusional thought content.  Patient endorses suicidal/self-harm/homicidal ideation, psychosis, and paranoia.    Total Time spent with patient: 30 minutes  Past Psychiatric History: schizoaffective d/o  Past Medical History:  Past Medical  History:  Diagnosis Date  . Cannabis abuse   . Paranoid schizophrenia (HCC)   . Schizoaffective disorder, depressive type (HCC) 09/17/2014   History reviewed. No pertinent surgical history. Family History:  Family History  Problem Relation Age of Onset  . Mental illness Brother   . Drug abuse Brother   . Mental illness Cousin   . Suicidality Cousin   . Diabetes Maternal Grandmother    Family Psychiatric  History: see above Social History:  Social History   Substance and Sexual Activity  Alcohol Use Yes     Social History   Substance and Sexual Activity  Drug Use Yes  . Types: Marijuana    Social History   Socioeconomic History  . Marital status: Single    Spouse name: Not on file  . Number of children: Not on file  . Years of education: Not on file  . Highest education level: Not on file  Occupational History  . Not on file  Tobacco Use  . Smoking status: Current Some Day Smoker    Packs/day: 0.25  . Smokeless tobacco: Never Used  Vaping Use  . Vaping Use: Never used  Substance and Sexual Activity  . Alcohol use: Yes  . Drug use: Yes    Types: Marijuana  . Sexual activity: Yes  Other Topics Concern  . Not on file  Social History Narrative  . Not on file   Social Determinants of Health   Financial Resource Strain: Low Risk   . Difficulty of Paying Living Expenses: Not hard at all  Food Insecurity: No Food Insecurity  .  Worried About Programme researcher, broadcasting/film/video in the Last Year: Never true  . Ran Out of Food in the Last Year: Never true  Transportation Needs: No Transportation Needs  . Lack of Transportation (Medical): No  . Lack of Transportation (Non-Medical): No  Physical Activity: Inactive  . Days of Exercise per Week: 0 days  . Minutes of Exercise per Session: 0 min  Stress: Stress Concern Present  . Feeling of Stress : Rather much  Social Connections: Moderately Isolated  . Frequency of Communication with Friends and Family: Once a week  . Frequency  of Social Gatherings with Friends and Family: Once a week  . Attends Religious Services: More than 4 times per year  . Active Member of Clubs or Organizations: Yes  . Attends Banker Meetings: More than 4 times per year  . Marital Status: Never married    Has this patient used any form of tobacco in the last 30 days? (Cigarettes, Smokeless Tobacco, Cigars, and/or Pipes) A prescription for an FDA-approved tobacco cessation medication was offered at discharge and the patient refused  Current Medications: Current Facility-Administered Medications  Medication Dose Route Frequency Provider Last Rate Last Admin  . buPROPion (WELLBUTRIN XL) 24 hr tablet 150 mg  150 mg Oral Daily Charm Rings, NP   150 mg at 03/02/20 0957  . divalproex (DEPAKOTE) DR tablet 500 mg  500 mg Oral BID Charm Rings, NP   500 mg at 03/02/20 0957  . hydrOXYzine (ATARAX/VISTARIL) tablet 25 mg  25 mg Oral TID PRN Charm Rings, NP   25 mg at 03/01/20 2204  . QUEtiapine (SEROQUEL) tablet 600 mg  600 mg Oral QHS Charm Rings, NP   600 mg at 03/01/20 2204  . traZODone (DESYREL) tablet 50 mg  50 mg Oral QHS PRN Charm Rings, NP   50 mg at 03/01/20 2204   Current Outpatient Medications  Medication Sig Dispense Refill  . buPROPion (WELLBUTRIN XL) 150 MG 24 hr tablet Take 1 tablet (150 mg total) by mouth daily. For depression 30 tablet 1  . divalproex (DEPAKOTE) 500 MG DR tablet Take 1 tablet (500 mg total) by mouth 2 (two) times daily. For mood stabilization 60 tablet 1  . hydrOXYzine (ATARAX/VISTARIL) 25 MG tablet Take 1 tablet (25 mg total) by mouth 3 (three) times daily as needed for anxiety. 75 tablet 1  . QUEtiapine (SEROQUEL) 300 MG tablet Take 2 tablets (600 mg total) by mouth at bedtime. For mood control 60 tablet 1  . traZODone (DESYREL) 50 MG tablet Take 1 tablet (50 mg total) by mouth at bedtime as needed for sleep. 30 tablet 1   PTA Medications: (Not in a hospital  admission)   Musculoskeletal: Strength & Muscle Tone: within normal limits Gait & Station: normal Patient leans: N/A  Psychiatric Specialty Exam: Physical Exam Vitals and nursing note reviewed.  Constitutional:      Appearance: Normal appearance.  HENT:     Head: Normocephalic.     Nose: Nose normal.  Pulmonary:     Effort: Pulmonary effort is normal.  Musculoskeletal:        General: Normal range of motion.     Cervical back: Normal range of motion.  Neurological:     General: No focal deficit present.     Mental Status: He is alert and oriented to person, place, and time.  Psychiatric:        Attention and Perception: Attention and perception normal.  Mood and Affect: Mood is anxious.        Speech: Speech normal.        Behavior: Behavior normal. Behavior is cooperative.        Thought Content: Thought content normal.        Cognition and Memory: Cognition and memory normal.        Judgment: Judgment normal.     Review of Systems  Psychiatric/Behavioral: The patient is nervous/anxious.   All other systems reviewed and are negative.   Blood pressure 120/67, pulse 85, temperature 97.9 F (36.6 C), temperature source Oral, resp. rate 18, weight 81.6 kg, SpO2 100 %.Body mass index is 21.91 kg/m.  General Appearance: Casual  Eye Contact:  Good  Speech:  Normal Rate  Volume:  Normal  Mood:  Euthymic  Affect:  Blunt  Thought Process:  Coherent and Descriptions of Associations: Intact  Orientation:  Full (Time, Place, and Person)  Thought Content:  WDL and Logical  Suicidal Thoughts:  No  Homicidal Thoughts:  No  Memory:  Immediate;   Good Recent;   Good Remote;   Good  Judgement:  Fair  Insight:  Fair  Psychomotor Activity:  Normal  Concentration:  Concentration: Good and Attention Span: Good  Recall:  Good  Fund of Knowledge:  Fair  Language:  Good  Akathisia:  No  Handed:  Right  AIMS (if indicated):     Assets:  Housing Leisure Time Physical  Health Resilience Social Support  ADL's:  Intact  Cognition:  WNL  Sleep:        Demographic Factors:  Male and Living alone  Loss Factors: NA  Historical Factors: NA  Risk Reduction Factors:   Sense of responsibility to family, Living with another person, especially a relative, Positive social support and Positive therapeutic relationship  Continued Clinical Symptoms:  None  Cognitive Features That Contribute To Risk:  None    Suicide Risk:  Minimal: No identifiable suicidal ideation.  Patients presenting with no risk factors but with morbid ruminations; may be classified as minimal risk based on the severity of the depressive symptoms   Plan Of Care/Follow-up recommendations:  Schizoaffective d/o: Restarted Seroquel 600 mg at bedtime -Restarted Depakote 500 mg BID  Depression: -Continue Wellbutrin 150 mg daily  Anxiety: -Continue hydroxyzine 25 mg TID PRN  Insomnia: -Continue Trazodone 50 mg daily at bedtime Activity:  as tolerate Diet:  heart healthy diet  Disposition: discharge  home Nanine Means, NP 03/02/2020, 10:34 AM

## 2020-03-02 NOTE — BH Assessment (Signed)
TTS completed reassessment. Pt presents calm, pleasant and oriented x 4. Pt reports to feel better this morning and inquired about access to his current medications. Pt discussed the importance of remaining on his medications with Catha Nottingham, NP and agreed to comply with medications. Pt denied any current SI/HI/AH/VH and contracted for safety.   Per Nanine Means, NP pt will be discharged with the recommendation to follow up with resources provided

## 2020-03-12 ENCOUNTER — Ambulatory Visit (HOSPITAL_COMMUNITY): Payer: No Payment, Other | Admitting: Licensed Clinical Social Worker

## 2020-04-10 ENCOUNTER — Other Ambulatory Visit: Payer: Self-pay

## 2020-04-10 ENCOUNTER — Emergency Department
Admission: EM | Admit: 2020-04-10 | Discharge: 2020-04-13 | Disposition: A | Discharge status: Other Acute Inpatient Hospital | Payer: Medicaid Other | Attending: Emergency Medicine | Admitting: Emergency Medicine

## 2020-04-10 ENCOUNTER — Encounter: Payer: Self-pay | Admitting: Medical Oncology

## 2020-04-10 DIAGNOSIS — R456 Violent behavior: Secondary | ICD-10-CM | POA: Insufficient documentation

## 2020-04-10 DIAGNOSIS — F250 Schizoaffective disorder, bipolar type: Secondary | ICD-10-CM | POA: Diagnosis not present

## 2020-04-10 DIAGNOSIS — F17210 Nicotine dependence, cigarettes, uncomplicated: Secondary | ICD-10-CM | POA: Insufficient documentation

## 2020-04-10 DIAGNOSIS — Z20822 Contact with and (suspected) exposure to covid-19: Secondary | ICD-10-CM | POA: Insufficient documentation

## 2020-04-10 DIAGNOSIS — F1290 Cannabis use, unspecified, uncomplicated: Secondary | ICD-10-CM | POA: Insufficient documentation

## 2020-04-10 DIAGNOSIS — F1220 Cannabis dependence, uncomplicated: Secondary | ICD-10-CM | POA: Diagnosis present

## 2020-04-10 DIAGNOSIS — F29 Unspecified psychosis not due to a substance or known physiological condition: Secondary | ICD-10-CM | POA: Insufficient documentation

## 2020-04-10 DIAGNOSIS — F200 Paranoid schizophrenia: Secondary | ICD-10-CM | POA: Insufficient documentation

## 2020-04-10 DIAGNOSIS — Y909 Presence of alcohol in blood, level not specified: Secondary | ICD-10-CM | POA: Insufficient documentation

## 2020-04-10 DIAGNOSIS — Z046 Encounter for general psychiatric examination, requested by authority: Secondary | ICD-10-CM | POA: Insufficient documentation

## 2020-04-10 LAB — COMPREHENSIVE METABOLIC PANEL
ALT: 22 U/L (ref 0–44)
AST: 35 U/L (ref 15–41)
Albumin: 4.4 g/dL (ref 3.5–5.0)
Alkaline Phosphatase: 37 U/L — ABNORMAL LOW (ref 38–126)
Anion gap: 16 — ABNORMAL HIGH (ref 5–15)
BUN: 13 mg/dL (ref 6–20)
CO2: 21 mmol/L — ABNORMAL LOW (ref 22–32)
Calcium: 9.2 mg/dL (ref 8.9–10.3)
Chloride: 105 mmol/L (ref 98–111)
Creatinine, Ser: 1.13 mg/dL (ref 0.61–1.24)
GFR calc non Af Amer: >60 mL/min (ref >60)
Glucose, Bld: 78 mg/dL (ref 70–99)
Potassium: 3.2 mmol/L — ABNORMAL LOW (ref 3.5–5.1)
Sodium: 142 mmol/L (ref 135–145)
Total Bilirubin: 1.3 mg/dL — ABNORMAL HIGH (ref 0.3–1.2)
Total Protein: 7.5 g/dL (ref 6.5–8.1)

## 2020-04-10 LAB — CBC WITH DIFFERENTIAL/PLATELET
Abs Immature Granulocytes: 0.01 10*3/uL (ref 0.00–0.07)
Basophils Absolute: 0 10*3/uL (ref 0.0–0.1)
Basophils Relative: 1 %
Eosinophils Absolute: 0 10*3/uL (ref 0.0–0.5)
Eosinophils Relative: 1 %
HCT: 44.6 % (ref 39.0–52.0)
Hemoglobin: 14.9 g/dL (ref 13.0–17.0)
Immature Granulocytes: 0 %
Lymphocytes Relative: 33 %
Lymphs Abs: 2.1 10*3/uL (ref 0.7–4.0)
MCH: 30 pg (ref 26.0–34.0)
MCHC: 33.4 g/dL (ref 30.0–36.0)
MCV: 89.9 fL (ref 80.0–100.0)
Monocytes Absolute: 0.8 10*3/uL (ref 0.1–1.0)
Monocytes Relative: 13 %
Neutro Abs: 3.3 10*3/uL (ref 1.7–7.7)
Neutrophils Relative %: 52 %
Platelets: 247 10*3/uL (ref 150–400)
RBC: 4.96 MIL/uL (ref 4.22–5.81)
RDW: 13 % (ref 11.5–15.5)
WBC: 6.4 10*3/uL (ref 4.0–10.5)
nRBC: 0 % (ref 0.0–0.2)

## 2020-04-10 LAB — ETHANOL: Alcohol, Ethyl (B): <10 mg/dL (ref <10)

## 2020-04-10 LAB — RESPIRATORY PANEL BY RT PCR (FLU A&B, COVID)
Influenza A by PCR: NEGATIVE
Influenza B by PCR: NEGATIVE
SARS Coronavirus 2 by RT PCR: NEGATIVE

## 2020-04-10 LAB — VALPROIC ACID LEVEL: Valproic Acid Lvl: <10 ug/mL — ABNORMAL LOW (ref 50.0–100.0)

## 2020-04-10 MED ORDER — BUPROPION HCL ER (XL) 150 MG PO TB24
150.0000 mg | ORAL_TABLET | Freq: Every day | ORAL | Status: DC
  Administered 2020-04-11 – 2020-04-12 (×2): 150 mg via ORAL
  Filled 2020-04-10 (×2): qty 1

## 2020-04-10 MED ORDER — HYDROXYZINE HCL 25 MG PO TABS: 25.0000 mg | ORAL_TABLET | Freq: Three times a day (TID) | ORAL | Status: DC | PRN

## 2020-04-10 MED ORDER — DIVALPROEX SODIUM 500 MG PO DR TAB
500.0000 mg | DELAYED_RELEASE_TABLET | Freq: Two times a day (BID) | ORAL | Status: DC
  Administered 2020-04-10 – 2020-04-12 (×5): 500 mg via ORAL
  Filled 2020-04-10 (×5): qty 1

## 2020-04-10 MED ORDER — LORAZEPAM 2 MG/ML IJ SOLN: 2.0000 mg | Freq: Once | INTRAMUSCULAR | Status: AC

## 2020-04-10 MED ORDER — HALOPERIDOL LACTATE 5 MG/ML IJ SOLN
INTRAMUSCULAR | Status: AC
  Administered 2020-04-10: 5 mg via INTRAMUSCULAR
  Filled 2020-04-10: qty 1

## 2020-04-10 MED ORDER — QUETIAPINE FUMARATE 200 MG PO TABS
600.0000 mg | ORAL_TABLET | Freq: Every day | ORAL | Status: DC
  Administered 2020-04-10 – 2020-04-12 (×3): 600 mg via ORAL
  Filled 2020-04-10 (×2): qty 3
  Filled 2020-04-10: qty 2

## 2020-04-10 MED ORDER — LORAZEPAM 2 MG/ML IJ SOLN
INTRAMUSCULAR | Status: AC
  Administered 2020-04-10: 2 mg via INTRAMUSCULAR
  Filled 2020-04-10: qty 1

## 2020-04-10 MED ORDER — HALOPERIDOL LACTATE 5 MG/ML IJ SOLN: 5.0000 mg | Freq: Once | INTRAMUSCULAR | Status: AC

## 2020-04-10 MED ORDER — TRAZODONE HCL 50 MG PO TABS: 50.0000 mg | ORAL_TABLET | Freq: Every evening | ORAL | Status: DC | PRN

## 2020-04-10 NOTE — ED Triage Notes (Signed)
Pt to ED via [place] [employer], was walking in roads and kicking over trash cans without clothes on. Pt was approached by PD and began to endorse SI and HI. Pt did report to PD that he has schizophrenia and has not been taking his meds. Pt was taken to [facility] first and ran out the door before he could be assessed. Pt arrives to ED fighting with PD and not wanting to cooperate, screaming and tearful.

## 2020-04-10 NOTE — ED Notes (Addendum)
Pt. Was given water and rejected snack.

## 2020-04-10 NOTE — ED Provider Notes (Signed)
Patient brought in by police.  Reportedly he was going down the street naked kicking trash cans and saying he wanted to kill himself.  He was taken to [facility] but ran away.  Police caught him again and brought him here.  Here he is yelling and screaming and resisting anybody's efforts to try to calm him down.  I have taken out commitment papers on him based on the history I gave above and the fact that he will not cooperate with any sort of evaluation at the present time.  I have ordered Ativan and Haldol IM for this gentleman.  I have done this in an effort not to physically restrain him and to calm him down.   Arnaldo Natal, MD 04/10/20 1346

## 2020-04-10 NOTE — ED Notes (Signed)
Report to include Situation, Background, Assessment, and Recommendations received from [place], [place]. Patient alert and oriented, warm and dry, in no acute distress. Patient denies HI, AH ,and pain. Patient made aware of Q15 minute rounds and Psychologist, counselling presence for their safety. Patient instructed to come to this nurse with needs or concerns.

## 2020-04-10 NOTE — ED Notes (Signed)
Hourly rounding reveals patient asleep in room. No complaints, stable, in no acute distress. Q15 minute rounds and monitoring via [place] and Officer to continue.  

## 2020-04-10 NOTE — Consult Note (Signed)
[facility] Face-to-Face Psychiatry Consult   Reason for Consult: Consult for this patient who was brought to the emergency room by local authorities with reports that he had been naked kicking things over acting bizarrely and talking about suicide in public.  Patient now placed under involuntary commitment Referring Physician: Juliette Alcide Patient Identification: Timothy [name] MRN:  [id number] Principal Diagnosis: Schizoaffective disorder, bipolar type (HCC) Diagnosis:  Principal Problem:   Schizoaffective disorder, bipolar type (HCC) Active Problems:   Cannabis use disorder, moderate, dependence (HCC)   Total Time spent with patient: 1 hour  Subjective:   Timothy [name] is a 24 y.o. male patient admitted with patient not currently willing to talk with me.  HPI: Patient seen chart reviewed.  24 year old man brought to the emergency room today with police report that he had been outside wearing no close walking down the street kicking things over acting bizarre.  It is reported that when they approached him he had talked about suicide and homicide.  In the emergency room patient was evidently agitated to the point they felt for safety he should be given some Ativan and Haldol IM.  At this time the patient is resting peacefully.  My attempts to wake him up only got him to open his eyes for a couple seconds and shake his head at may not answer any questions.  Patient does not appear to have any labs drawn yet.  Vitals normal however and the patient appears to be in no distress physically.  Reviewing the chart it is clear that he has had multiple visits to the emergency rooms and hospitals this summer with psychotic symptoms repeatedly and carries a diagnosis of schizoaffective disorder.  Past Psychiatric History: Patient has had hospitalizations within the past few months as well as more recent visits to the emergency room.  Seems to often present in acute distress with paranoid and psychotic symptoms and then  may deny them a short time later.  Treated as an outpatient with Seroquel from what I can determine.  According to past notes the patient has a history of at least 2 serious suicide attempts but no documented history of violence.  He has a history of cannabis abuse that has been of concern with his treatment.  Looks like he used to go to [facility].  Not clear who if anyone is his current provider  Risk to Self:   Risk to Others:   Prior Inpatient Therapy:   Prior Outpatient Therapy:    Past Medical History:  Past Medical History:  Diagnosis Date  . Cannabis abuse   . Paranoid schizophrenia (HCC)   . Schizoaffective disorder, depressive type (HCC) 09/17/2014   History reviewed. No pertinent surgical history. Family History:  Family History  Problem Relation Age of Onset  . Mental illness Brother   . Drug abuse Brother   . Mental illness Cousin   . Suicidality Cousin   . Diabetes Maternal Grandmother    Family Psychiatric  History: Patient unable to provide any information no information available Social History:  Social History   Substance and Sexual Activity  Alcohol Use Yes     Social History   Substance and Sexual Activity  Drug Use Yes  . Types: Marijuana    Social History   Socioeconomic History  . Marital status: Single    Spouse name: Not on file  . Number of children: Not on file  . Years of education: Not on file  . Highest education level: Not on file  Occupational History  . Not on file  Tobacco Use  . Smoking status: Current Some Day Smoker    Packs/day: 0.25  . Smokeless tobacco: Never Used  Vaping Use  . Vaping Use: Never used  Substance and Sexual Activity  . Alcohol use: Yes  . Drug use: Yes    Types: Marijuana  . Sexual activity: Yes  Other Topics Concern  . Not on file  Social History Narrative  . Not on file   Social Determinants of Health   Financial Resource Strain: Low Risk   . Difficulty of Paying Living Expenses: Not hard at all   Food Insecurity: No Food Insecurity  . Worried About Programme researcher, broadcasting/film/videounning Out of Food in the Last Year: Never true  . Ran Out of Food in the Last Year: Never true  Transportation Needs: No Transportation Needs  . Lack of Transportation (Medical): No  . Lack of Transportation (Non-Medical): No  Physical Activity: Inactive  . Days of Exercise per Week: 0 days  . Minutes of Exercise per Session: 0 min  Stress: Stress Concern Present  . Feeling of Stress : Rather much  Social Connections: Moderately Isolated  . Frequency of Communication with Friends and Family: Once a week  . Frequency of Social Gatherings with Friends and Family: Once a week  . Attends Religious Services: More than 4 times per year  . Active Member of Clubs or Organizations: Yes  . Attends BankerClub or Organization Meetings: More than 4 times per year  . Marital Status: Never married   Additional Social History:    Allergies:   Allergies  Allergen Reactions  . Haldol [Haloperidol Lactate] Other (See Comments)    Muscle stiffness  . Abilify [Aripiprazole]     eps  . Penicillins Hives    Felt like throat was closing Has patient had a PCN reaction causing immediate rash, facial/tongue/throat swelling, SOB or lightheadedness with hypotension: Unknown Has patient had a PCN reaction causing severe rash involving mucus membranes or skin necrosis: unknown Has patient had a PCN reaction that required hospitalization Unknown Has patient had a PCN reaction occurring within the last 10 years: Unknown If all of the above answers are "NO", then may proceed with Cephalosporin use.   . Zyprexa [Olanzapine]     eps    Labs: No results found for this or any previous visit (from the past 48 hour(s)).  Current Facility-Administered Medications  Medication Dose Route Frequency Provider Last Rate Last Admin  . buPROPion (WELLBUTRIN XL) 24 hr tablet 150 mg  150 mg Oral Daily Shaune PollackIsaacs, Cameron, MD      . divalproex (DEPAKOTE) DR tablet 500 mg  500  mg Oral BID Shaune PollackIsaacs, Cameron, MD      . hydrOXYzine (ATARAX/VISTARIL) tablet 25 mg  25 mg Oral TID PRN Shaune PollackIsaacs, Cameron, MD      . QUEtiapine (SEROQUEL) tablet 600 mg  600 mg Oral QHS Shaune PollackIsaacs, Cameron, MD      . traZODone (DESYREL) tablet 50 mg  50 mg Oral QHS PRN Shaune PollackIsaacs, Cameron, MD       Current Outpatient Medications  Medication Sig Dispense Refill  . buPROPion (WELLBUTRIN XL) 150 MG 24 hr tablet Take 1 tablet (150 mg total) by mouth daily. For depression 30 tablet 1  . divalproex (DEPAKOTE) 500 MG DR tablet Take 1 tablet (500 mg total) by mouth 2 (two) times daily. For mood stabilization 60 tablet 1  . hydrOXYzine (ATARAX/VISTARIL) 25 MG tablet Take 1 tablet (25 mg total) by  mouth 3 (three) times daily as needed for anxiety. 75 tablet 1  . QUEtiapine (SEROQUEL) 300 MG tablet Take 2 tablets (600 mg total) by mouth at bedtime. For mood control 60 tablet 1  . traZODone (DESYREL) 50 MG tablet Take 1 tablet (50 mg total) by mouth at bedtime as needed for sleep. 30 tablet 1    Musculoskeletal: Strength & Muscle Tone: within normal limits Gait & Station: normal Patient leans: N/A  Psychiatric Specialty Exam: Physical Exam Vitals and nursing note reviewed.  Constitutional:      Appearance: He is well-developed.  HENT:     Head: Normocephalic and atraumatic.  Eyes:     Conjunctiva/sclera: Conjunctivae normal.     Pupils: Pupils are equal, round, and reactive to light.  Cardiovascular:     Heart sounds: Normal heart sounds.  Pulmonary:     Effort: Pulmonary effort is normal.  Abdominal:     Palpations: Abdomen is soft.  Musculoskeletal:        General: Normal range of motion.     Cervical back: Normal range of motion.  Skin:    General: Skin is warm and dry.  Neurological:     General: No focal deficit present.     Mental Status: He is alert.  Psychiatric:        Attention and Perception: He is inattentive.        Mood and Affect: Affect is flat.        Speech: He is  noncommunicative.     Review of Systems  Unable to perform ROS: Patient unresponsive    Blood pressure 117/66, pulse 76, temperature 98.1 F (36.7 C), temperature source Oral, resp. rate (!) 24, height 6\' 4"  (1.93 m), weight 81 kg, SpO2 100 %.Body mass index is 21.74 kg/m.  General Appearance: Casual  Eye Contact:  Minimal  Speech:  Negative  Volume:  Decreased  Mood:  Negative  Affect:  Negative  Thought Process:  NA  Orientation:  Negative  Thought Content:  Negative  Suicidal Thoughts:  No  Homicidal Thoughts:  No  Memory:  Negative  Judgement:  Negative  Insight:  Negative  Psychomotor Activity:  Negative  Concentration:  Concentration: Negative  Recall:  Negative  Fund of Knowledge:  Negative  Language:  Negative  Akathisia:  Negative  Handed:  Right  AIMS (if indicated):     Assets:  Physical Health  ADL's:  Impaired  Cognition:  Impaired,  Mild  Sleep:        Treatment Plan Summary: Daily contact with patient to assess and evaluate symptoms and progress in treatment, Medication management and Plan Despite his current sedation and lack of communication and seems pretty clear from the current situation and the multiple recent presentations that this is a gentleman with serious ongoing psychotic disorder who would benefit from inpatient hospital treatment and who continues to probably pose a danger to himself if not treated in his current condition.  I will allow the current IVC to continue and will place orders in the chart for admission to psychiatric ward.  Not sure if we have any beds available here tonight.  I have spoken with triage specialist and suggested if he cannot be accommodated out of bed at our hospital that they can work on referral to outside hospitals.  Meanwhile I am going to go ahead and order all of the basic lab studies to be done.  Disposition: Recommend psychiatric Inpatient admission when medically cleared.  , MD  04/10/2020 5:45 PM

## 2020-04-10 NOTE — BH Assessment (Signed)
Attempted to assess patient, patient unable to aroused at this time, will attempt at a later time.

## 2020-04-10 NOTE — ED Notes (Signed)
Patient continues to be asleep, NAD noted

## 2020-04-10 NOTE — ED Notes (Signed)
IVC pending placement 

## 2020-04-11 ENCOUNTER — Inpatient Hospital Stay: Admission: AD | Admit: 2020-04-11 | Payer: No Payment, Other | Source: Intra-hospital | Admitting: Psychiatry

## 2020-04-11 LAB — URINE DRUG SCREEN, QUALITATIVE (ARMC ONLY)
Amphetamines, Ur Screen: NOT DETECTED
Barbiturates, Ur Screen: NOT DETECTED
Benzodiazepine, Ur Scrn: NOT DETECTED
Cannabinoid 50 Ng, Ur ~~LOC~~: POSITIVE — AB
Cocaine Metabolite,Ur ~~LOC~~: NOT DETECTED
MDMA (Ecstasy)Ur Screen: NOT DETECTED
Methadone Scn, Ur: NOT DETECTED
Opiate, Ur Screen: NOT DETECTED
Phencyclidine (PCP) Ur S: NOT DETECTED
Tricyclic, Ur Screen: NOT DETECTED

## 2020-04-11 MED ORDER — TRIHEXYPHENIDYL HCL 2 MG PO TABS
1.0000 mg | ORAL_TABLET | Freq: Two times a day (BID) | ORAL | Status: DC
  Administered 2020-04-11 – 2020-04-12 (×3): 1 mg via ORAL
  Filled 2020-04-11 (×4): qty 1

## 2020-04-11 MED ORDER — THIOTHIXENE 2 MG PO CAPS
2.0000 mg | ORAL_CAPSULE | Freq: Two times a day (BID) | ORAL | Status: DC
  Administered 2020-04-11 – 2020-04-12 (×4): 2 mg via ORAL
  Filled 2020-04-11 (×5): qty 1

## 2020-04-11 NOTE — ED Notes (Signed)
Hourly rounding reveals patient asleep in room. No complaints, stable, in no acute distress. Q15 minute rounds and monitoring via [place] and Officer to continue.  

## 2020-04-11 NOTE — BH Assessment (Addendum)
Referral Checks:   [facility] ([phone]- [phone]), Denied due to no insurance    Earl[facility] Plater [phone]), Under review with Herbert Seta, patient needs a UDS and EKG completed; task sent and completed at 5:15am 04/12/20   [facility] Lan [phone], [phone], [phone] or [phone]),    [facility] [phone] or [phone])   [facility] Metro [phone]), Intake staff reports referral has not been reviewed yet as of 1:00am, contacted back at 4:00am No answer   [facility] [phone] -or- [phone]), Denied due to patient being out the catchment area for the facility   [facility] Daniels [phone]).   [facility] [phone])

## 2020-04-11 NOTE — ED Notes (Signed)
Pt was given a sandwich tray, peanut butter, graham crackers, and a sprit.

## 2020-04-11 NOTE — ED Provider Notes (Signed)
Emergency Medicine Observation Re-evaluation Note  Timothy [name] is a 24 y.o. male, seen on rounds today.  Pt initially presented to the ED for complaints of Psychiatric Evaluation Currently, the patient is resting, voices no medical complaints.  Physical Exam  BP (!) 103/58 (BP Location: Left Arm)   Pulse 74   Temp 97.7 F (36.5 C) (Oral)   Resp 20   Ht 6\' 4"  (1.93 m)   Wt 81 kg   SpO2 98%   BMI 21.74 kg/m  Physical Exam General: Resting in no acute distress Cardiac: No cyanosis Lungs: Equal rise and fall Psych: Not agitated  ED Course / MDM  EKG:    I have reviewed the labs performed to date as well as medications administered while in observation.  Recent changes in the last 24 hours include no changes overnight.  Plan  Current plan is for psychiatric hospitalization. Patient is under full IVC at this time.   , MD 04/11/20 [phone]

## 2020-04-11 NOTE — ED Notes (Signed)
Patient transferred to room 4 from the quad, He is calm and cooperative at this time, will continue to monitor, camera surveillance in progress for safety.

## 2020-04-11 NOTE — ED Notes (Signed)
IVC/  PENDING  PLACEMENT 

## 2020-04-11 NOTE — Progress Notes (Signed)
Patient ID: Timothy [name], male   DOB: 06/13/1996, 24 y.o.   MRN: [id number]   Brief Progress note  Patient remains asleep with sheet over his head but answers yes he hears me  Continues on unit pending admission  Low dose navane bid added to seroquel to help with antipsychotic effect   Awaits bed transfer   Left Message for Mom by phone  No answer   Rama Candise Bowens MD

## 2020-04-11 NOTE — BH Assessment (Signed)
Referral information for Psychiatric Hospitalization faxed to;   . [facility] ([phone]-or- [phone]),   . [facility] ([phone]---[phone]---[phone]),  . [facility] ([phone], [phone], [phone] or [phone]),   . [facility] ([phone] or [phone])  . [facility] ([phone]),   . [facility] ([phone] -or- [phone]),   . [facility] ([phone]).  . [facility] ([phone]) 

## 2020-04-11 NOTE — ED Notes (Signed)
Hourly rounding reveals patient asleep in room. No complaints, stable, in no acute distress. Q15 minute rounds and monitoring via Security Cameras to continue. 

## 2020-04-11 NOTE — ED Provider Notes (Signed)
[facility] Emergency Department Provider Note   ____________________________________________    I have reviewed the triage vital signs and the nursing notes.   HISTORY  Chief Complaint Psychiatric Evaluation     HPI Timothy [name] is a 24 y.o. male with an apparent history of schizophrenia brought in by police under IVC for apparently running down the street naked kicking over trash cans and other bizarre behavior.  Patient is extremely aggressive and screaming and is requiring multiple police officers to control him upon arrival.  Reportedly endorsed SI to police.  No further history available at this time  Past Medical History:  Diagnosis Date  . Cannabis abuse   . Paranoid schizophrenia (HCC)   . Schizoaffective disorder, depressive type (HCC) 09/17/2014    Patient Active Problem List   Diagnosis Date Noted  . Benzodiazepine abuse (HCC) 08/13/2016  . Schizoaffective disorder, depressive type (HCC) 08/12/2016  . Schizoaffective disorder, bipolar type (HCC) 09/18/2014  . Tobacco use disorder 09/18/2014  . 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 Cannabis use disorder, moderate, dependence (HCC) 09/17/2014    History reviewed. No pertinent surgical history.  Prior to Admission medications   Medication Sig Start Date End Date Taking? Authorizing Provider  buPROPion (WELLBUTRIN XL) 150 MG 24 hr tablet Take 1 tablet (150 mg total) by mouth daily. For depression Patient not taking: Reported on 04/10/2020 03/02/20   Charm Rings, NP  divalproex (DEPAKOTE) 500 MG DR tablet Take 1 tablet (500 mg total) by mouth 2 (two) times daily. For mood stabilization Patient not taking: Reported on 04/10/2020 03/02/20   Charm Rings, NP  hydrOXYzine (ATARAX/VISTARIL) 25 MG tablet Take 1 tablet (25 mg total) by mouth 3 (three) times daily as needed for anxiety. Patient not taking: Reported on 04/10/2020 03/02/20   Charm Rings, NP  QUEtiapine  (SEROQUEL) 300 MG tablet Take 2 tablets (600 mg total) by mouth at bedtime. For mood control Patient not taking: Reported on 04/10/2020 03/02/20   Charm Rings, NP  traZODone (DESYREL) 50 MG tablet Take 1 tablet (50 mg total) by mouth at bedtime as needed for sleep. Patient not taking: Reported on 04/10/2020 03/02/20   Charm Rings, NP     Allergies Haldol [haloperidol lactate], Abilify [aripiprazole], Penicillins, and Zyprexa [olanzapine]  Family History  Problem Relation Age of Onset  . Mental illness Brother   . Drug abuse Brother   . Mental illness Cousin   . Suicidality Cousin   . Diabetes Maternal Grandmother     Social History Social History   Tobacco Use  . Smoking status: Current Some Day Smoker    Packs/day: 0.25  . Smokeless tobacco: Never Used  Vaping Use  . Vaping Use: Never used  Substance Use Topics  . Alcohol use: Yes  . Drug use: Yes    Types: Marijuana    Unable to obtain review of Systems    ____________________________________________   PHYSICAL EXAM:  VITAL SIGNS: ED Triage Vitals  Enc Vitals Group     BP 04/10/20 1349 117/66     Pulse Rate 04/10/20 1349 76     Resp 04/10/20 1349 (!) 24     Temp 04/10/20 1349 98.1 F (36.7 C)     Temp Source 04/10/20 1349 Oral     SpO2 04/10/20 1349 100 %     Weight 04/10/20 1346 81 kg (178 lb 9.2 oz)     Height 04/10/20 1346 1.93 m (6\' 4" )  Head Circumference --      Peak Flow --      Pain Score 04/10/20 1346 0     Pain Loc --      Pain Edu? --      Excl. in GC? --    Constitutional: Alert, aggressive, violent Eyes: Conjunctivae are normal.  Head: Atraumatic. Nose: No swelling or epistaxis Mouth/Throat: Mucous membranes are moist.   Neck:  Painless ROM CardiovascularGood peripheral circulation. Respiratory: Normal respiratory effort.  No retractions. Gastrointestinal: Soft and nontender. No distention.   Musculoskeletal: Warm and well perfused Neurologic:  Normal speech and language.  No gross focal neurologic deficits are appreciated.  Skin:  Skin is warm, dry and intact.  Psychiatric: Bizarre mood, psychosis, aggression  ____________________________________________   LABS (all labs ordered are listed, but only abnormal results are displayed)  Labs Reviewed  COMPREHENSIVE METABOLIC PANEL - Abnormal; Notable for the following components:      Result Value   Potassium 3.2 (*)    CO2 21 (*)    Alkaline Phosphatase 37 (*)    Total Bilirubin 1.3 (*)    Anion gap 16 (*)    All other components within normal limits  VALPROIC ACID LEVEL - Abnormal; Notable for the following components:   Valproic Acid Lvl <10 (*)    All other components within normal limits  RESPIRATORY PANEL BY RT PCR (FLU A&B, COVID)  ETHANOL  CBC WITH DIFFERENTIAL/PLATELET  URINE DRUG SCREEN, QUALITATIVE ([facility] ONLY)   ____________________________________________  EKG   ____________________________________________  RADIOLOGY   ____________________________________________   PROCEDURES  Procedure(s) performed: No  Procedures   Critical Care performed: no   ____________________________________________   INITIAL IMPRESSION / ASSESSMENT AND PLAN / ED COURSE  Pertinent labs & imaging results that were available during my care of the patient were reviewed by me and considered in my medical decision making (see chart for details).  Patient with a history of schizophrenia presents with psychosis, aggressive behavior.  Required IM medications for patient and staff safety.  Strongly suspicious for schizophrenia exacerbation, unclear whether compliant with medications.  Will consult psychiatry and TTS.  Medically cleared for psychiatric evaluation.  The patient has been placed in psychiatric observation due to the need to provide a safe environment for the patient while obtaining psychiatric consultation and evaluation, as well as ongoing medical and medication management to treat the  patient's condition.  The patient has been placed under full IVC at this time.     ____________________________________________   FINAL CLINICAL IMPRESSION(S) / ED DIAGNOSES  Final diagnoses:  Psychosis, unspecified psychosis type (HCC)        Note:  This document was prepared using Dragon voice recognition software and may include unintentional dictation errors.   Jene Every, MD 04/11/20 [phone]

## 2020-04-11 NOTE — ED Notes (Signed)
Attempted to move pt to [facility], pt unable to move due to unsteady on feet and concern for safety

## 2020-04-11 NOTE — ED Notes (Signed)
Patient asking for food, sandwich tray given to him per April ( security guard).

## 2020-04-11 NOTE — BH Assessment (Deleted)
PATIENT BED AVAILABLE AFTER 9AM ON 04/12/20  Patient has been accepted to [facility].  Patient assigned to room [facility] C-Unit Accepting physician is Dr. Sallyanne Kuster.  Call report to [phone].  Representative was [place].   ER Staff is aware of it:  [facility] ER Secretary  Dr. Delton Prairie, ER MD  [facility] Patient's Nurse     Address: [address], [place] [address] Patient must check-in at the [facility]

## 2020-04-11 NOTE — ED Notes (Signed)
Hourly rounding reveals patient awake in room. No complaints, stable, in no acute distress. Q15 minute rounds and monitoring via Security Cameras to continue. 

## 2020-04-11 NOTE — ED Notes (Signed)
Report to include Situation, Background, Assessment, and Recommendations received from [place], [place]. Patient alert and oriented, warm and dry, in no acute distress. Patient denies HI and pain. Patient made aware of Q15 minute rounds and security cameras for their safety. Patient instructed to come to this nurse with needs or concerns.

## 2020-04-11 NOTE — ED Provider Notes (Signed)
-----------------------------------------   12:05 AM on 04/11/2020 -----------------------------------------  The patient has been placed in psychiatric observation due to the need to provide a safe environment for the patient while obtaining psychiatric consultation and evaluation, as well as ongoing medical and medication management to treat the patient's condition.  The patient has been placed under full IVC at this time.    Irean Hong, MD 04/11/20 [phone]

## 2020-04-11 NOTE — ED Notes (Signed)
Patient ate 100% of lunch and beverage.  

## 2020-04-12 NOTE — ED Notes (Signed)
Hourly rounding reveals patient asleep in room. No complaints, stable, in no acute distress. Q15 minute rounds and monitoring via Security Cameras to continue. 

## 2020-04-12 NOTE — BH Assessment (Signed)
Writer spoke with the patient to complete an updated/reassessment. Patient was guarded and withdrawn. He states he was doing well but patient continue to need inpatient treatment.

## 2020-04-12 NOTE — BH Assessment (Addendum)
Patient has been accepted to [facility].  Patient assigned to [facility]. Accepting physician is Dr. Estill Cotta .  Call report to [phone].  Representative was [place].   ER Staff is aware of it:  Delaney Meigs, ER Secretary  Dr. Derrill Kay, ER MD  Alvis Lemmings, Patient's Nurse     Pt can be transported anytime after 8am on 04/13/20.

## 2020-04-12 NOTE — ED Notes (Signed)
Assumed care of patient this morning. Patient sleeping comfortably, as per prior nurse patient slept throughout the night. Safety maintained will monitor.

## 2020-04-12 NOTE — ED Provider Notes (Signed)
Emergency Medicine Observation Re-evaluation Note  Timothy [name] is a 24 y.o. male, seen on rounds today.  Pt initially presented to the ED for complaints of Psychiatric Evaluation Currently, the patient is calm and resting without distress.  Physical Exam  BP 110/62 (BP Location: Left Arm)   Pulse 78   Temp 97.8 F (36.6 C) (Oral)   Resp 16   Ht 6\' 4"  (1.93 m)   Wt 81 kg   SpO2 98%   BMI 21.74 kg/m  Physical Exam General: Patient resting comfortably without any distress Cardiac: Normal perfusion Lungs: Normal chest rise and fall Psych: Calm without distress  ED Course / MDM  EKG:    I have reviewed the labs performed to date as well as medications administered while in observation.  Recent changes in the last 24 hours include awaiting disposition.  Plan  Current plan is for ongoing psychiatric disposition. Patient is under full IVC at this time.   , MD 04/12/20 [phone]

## 2020-04-12 NOTE — BH Assessment (Signed)
Referral Checks:   [facility] ([phone]- [phone]), Denied due to no insurance    [facility] [phone]), Under review with Herbert Seta, patient needs a UDS and EKG completed; task sent and completed at 5:15am 04/12/20   [facility] [phone], [phone], [phone] or [phone]), Unable to reach anyone.   [facility] [phone] or [phone]), Unable to reach anyone.   [facility] ([phone]), Information refaxed.   [facility] [phone] -or- [phone]), Denied due to patient being out the catchment area for the facility   [facility] [phone]), left voicemail message requesting phone call.   [facility] [phone])

## 2020-04-12 NOTE — BH Assessment (Signed)
[facility] [phone]),  -Access Team [phone] 9:25 PM Per Herbert Seta pt declined.   [facility] [phone], [phone], [phone] or [phone]), 9:57 PM Per Amy there is no one to review pt at this time. Amy advised a call back to follow up at around 9:30-10 AM.    [facility] [phone] or [phone]), 9:59 PM Per Irish Lack there are no availiable beds at this time.    [facility] ([phone]), 10:00 PM Per Hex; referrals have yet to be reviewed. Agreed to call back to follow up.    [facility] [phone]), 10:02 PM Left voicemail message requesting phone call.   [facility] [phone]) 10:03 PM Per Misty Stanley no record of referral. Referral refaxed at 10:05 PM. Additionally no male beds availiable at this time.

## 2020-04-12 NOTE — BH Assessment (Signed)
Writer received phone call from [facility] reporting that patient is unable to accepted due to patient being outside of the [facility] catchment area. TTS to continue with looking for placement  Communicated this to: EDP Dr. Dolores Frame BHU RN [facility] ED Secretary Olegario Messier

## 2020-04-12 NOTE — ED Provider Notes (Signed)
-----------------------------------------   4:58 AM on 04/12/2020 -----------------------------------------  [facility] has declined to accept patient.  TTS working on referrals.   Irean Hong, MD 04/12/20 [phone]

## 2020-04-13 NOTE — ED Provider Notes (Signed)
Patient has been evaluated, stable for transfer to psychiatric facility via law enforcement   Sharyn Creamer, MD 04/13/20 [phone]

## 2020-04-13 NOTE — ED Provider Notes (Signed)
Emergency Medicine Observation Re-evaluation Note  Timothy [name] is a 24 y.o. male, seen on rounds today.  Pt initially presented to the ED for complaints of Psychiatric Evaluation Currently, the patient is calm and resting without distress.  Easily alerts to voice.  Physical Exam  BP 118/74 (BP Location: Right Arm)   Pulse 77   Temp 98.6 F (37 C) (Oral)   Resp 17   Ht 6\' 4"  (1.93 m)   Wt 81 kg   SpO2 99%   BMI 21.74 kg/m  Physical Exam General: Patient resting comfortably without any distress and easily alerts to voice Cardiac: Normal perfusion Lungs: Normal chest rise and fall Psych: Calm without distress  ED Course / MDM  EKG:    I have reviewed the labs performed to date as well as medications administered while in observation.  Recent changes in the last 24 hours include awaiting disposition.  Plan  Current plan is for ongoing psychiatric disposition. Patient is under full IVC at this time.   , MD 04/12/20 06/12/20    Conley Rolls, MD 04/13/20 [phone]

## 2020-04-13 NOTE — ED Notes (Signed)
Attempted to call report to [facility] [phone] with no answer or machine to leave voicemail. Patient left at this time with SO to be transported to [facility].

## 2020-05-05 ENCOUNTER — Ambulatory Visit (HOSPITAL_COMMUNITY): Payer: No Payment, Other | Admitting: Licensed Clinical Social Worker

## 2020-05-27 ENCOUNTER — Encounter: Payer: Self-pay | Admitting: Radiology

## 2020-05-27 ENCOUNTER — Emergency Department: Payer: Self-pay

## 2020-05-27 ENCOUNTER — Other Ambulatory Visit: Payer: Self-pay

## 2020-05-27 ENCOUNTER — Emergency Department
Admission: EM | Admit: 2020-05-27 | Discharge: 2020-05-27 | Disposition: A | Discharge status: Home / Self Care | Payer: Self-pay | Attending: Emergency Medicine | Admitting: Emergency Medicine

## 2020-05-27 DIAGNOSIS — S5002XA Contusion of left elbow, initial encounter: Secondary | ICD-10-CM | POA: Insufficient documentation

## 2020-05-27 DIAGNOSIS — F17200 Nicotine dependence, unspecified, uncomplicated: Secondary | ICD-10-CM | POA: Insufficient documentation

## 2020-05-27 DIAGNOSIS — Y92410 Unspecified street and highway as the place of occurrence of the external cause: Secondary | ICD-10-CM | POA: Insufficient documentation

## 2020-05-27 DIAGNOSIS — Y9355 Activity, bike riding: Secondary | ICD-10-CM | POA: Insufficient documentation

## 2020-05-27 DIAGNOSIS — T1490XA Injury, unspecified, initial encounter: Secondary | ICD-10-CM

## 2020-05-27 LAB — COMPREHENSIVE METABOLIC PANEL
ALT: 18 U/L (ref 0–44)
AST: 26 U/L (ref 15–41)
Albumin: 3.7 g/dL (ref 3.5–5.0)
Alkaline Phosphatase: 35 U/L — ABNORMAL LOW (ref 38–126)
Anion gap: 10 (ref 5–15)
BUN: 14 mg/dL (ref 6–20)
CO2: 23 mmol/L (ref 22–32)
Calcium: 8.5 mg/dL — ABNORMAL LOW (ref 8.9–10.3)
Chloride: 106 mmol/L (ref 98–111)
Creatinine, Ser: 1.18 mg/dL (ref 0.61–1.24)
GFR, Estimated: >60 mL/min (ref >60)
Glucose, Bld: 148 mg/dL — ABNORMAL HIGH (ref 70–99)
Potassium: 3.9 mmol/L (ref 3.5–5.1)
Sodium: 139 mmol/L (ref 135–145)
Total Bilirubin: 1.2 mg/dL (ref 0.3–1.2)
Total Protein: 6.4 g/dL — ABNORMAL LOW (ref 6.5–8.1)

## 2020-05-27 LAB — LACTIC ACID, PLASMA: Lactic Acid, Venous: 2.1 mmol/L (ref 0.5–1.9)

## 2020-05-27 LAB — CBC
HCT: 43.7 % (ref 39.0–52.0)
Hemoglobin: 14.4 g/dL (ref 13.0–17.0)
MCH: 30.6 pg (ref 26.0–34.0)
MCHC: 33 g/dL (ref 30.0–36.0)
MCV: 92.8 fL (ref 80.0–100.0)
Platelets: 217 10*3/uL (ref 150–400)
RBC: 4.71 MIL/uL (ref 4.22–5.81)
RDW: 13.7 % (ref 11.5–15.5)
WBC: 6.3 10*3/uL (ref 4.0–10.5)
nRBC: 0 % (ref 0.0–0.2)

## 2020-05-27 LAB — SAMPLE TO BLOOD BANK

## 2020-05-27 LAB — URINALYSIS, COMPLETE (UACMP) WITH MICROSCOPIC
Bacteria, UA: NONE SEEN
Bilirubin Urine: NEGATIVE
Glucose, UA: NEGATIVE mg/dL
Hgb urine dipstick: NEGATIVE
Ketones, ur: NEGATIVE mg/dL
Leukocytes,Ua: NEGATIVE
Nitrite: NEGATIVE
Protein, ur: NEGATIVE mg/dL
Specific Gravity, Urine: 1.031 — ABNORMAL HIGH (ref 1.005–1.030)
pH: 5 (ref 5.0–8.0)

## 2020-05-27 MED ORDER — FENTANYL CITRATE (PF) 100 MCG/2ML IJ SOLN
25.0000 ug | Freq: Once | INTRAMUSCULAR | Status: AC
  Administered 2020-05-27: 25 ug via INTRAVENOUS
  Filled 2020-05-27: qty 2

## 2020-05-27 MED ORDER — SODIUM CHLORIDE 0.9 % IV BOLUS
500.0000 mL | Freq: Once | INTRAVENOUS | Status: AC
  Administered 2020-05-27: 500 mL via INTRAVENOUS

## 2020-05-27 MED ORDER — IOHEXOL 300 MG/ML  SOLN
100.0000 mL | Freq: Once | INTRAMUSCULAR | Status: AC | PRN
  Administered 2020-05-27: 100 mL via INTRAVENOUS
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
## 2020-05-27 NOTE — ED Notes (Signed)
Patient given paper scrub shirt, pants still intact.  Encouraged to get dressed and then will ambulate

## 2020-08-18 ENCOUNTER — Emergency Department
Admission: EM | Admit: 2020-08-18 | Discharge: 2020-08-20 | Disposition: A | Discharge status: Home / Self Care | Payer: Medicaid Other | Attending: Emergency Medicine | Admitting: Emergency Medicine

## 2020-08-18 ENCOUNTER — Other Ambulatory Visit: Payer: Self-pay

## 2020-08-18 DIAGNOSIS — Z20822 Contact with and (suspected) exposure to covid-19: Secondary | ICD-10-CM | POA: Insufficient documentation

## 2020-08-18 DIAGNOSIS — F250 Schizoaffective disorder, bipolar type: Secondary | ICD-10-CM | POA: Diagnosis present

## 2020-08-18 DIAGNOSIS — F29 Unspecified psychosis not due to a substance or known physiological condition: Secondary | ICD-10-CM

## 2020-08-18 DIAGNOSIS — F22 Delusional disorders: Secondary | ICD-10-CM | POA: Insufficient documentation

## 2020-08-18 DIAGNOSIS — F17200 Nicotine dependence, unspecified, uncomplicated: Secondary | ICD-10-CM | POA: Insufficient documentation

## 2020-08-18 DIAGNOSIS — F1220 Cannabis dependence, uncomplicated: Secondary | ICD-10-CM | POA: Insufficient documentation

## 2020-08-18 DIAGNOSIS — F209 Schizophrenia, unspecified: Secondary | ICD-10-CM
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
## 2020-08-18 NOTE — ED Provider Notes (Signed)
[facility] Emergency Department Provider Note   ____________________________________________   Event Date/Time   First MD Initiated Contact with Patient 08/18/20 2332     (approximate)  I have reviewed the triage vital signs and the nursing notes.   HISTORY  Chief Complaint Psychiatric Evaluation    HPI Timothy [name] is a 25 y.o. male brought to the ED via EMS under IVC.  Patient with a history of schizophrenia, off his medications, hearing voices.  Refusing to have blood work done currently.     Past Medical History:  Diagnosis Date  . Cannabis abuse   . Paranoid schizophrenia (HCC)   . Schizoaffective disorder, depressive type (HCC) 09/17/2014    Patient Active Problem List   Diagnosis Date Noted  . Benzodiazepine abuse (HCC) 08/13/2016  . Schizoaffective disorder, depressive type (HCC) 08/12/2016  . Schizoaffective disorder, bipolar type (HCC) 09/18/2014  . Tobacco use disorder 09/18/2014  . 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 Cannabis use disorder, moderate, dependence (HCC) 09/17/2014    History reviewed. No pertinent surgical history.  Prior to Admission medications   Medication Sig Start Date End Date Taking? Authorizing Provider  buPROPion (WELLBUTRIN XL) 150 MG 24 hr tablet Take 1 tablet (150 mg total) by mouth daily. For depression Patient not taking: No sig reported 03/02/20   Charm Rings, NP  divalproex (DEPAKOTE) 500 MG DR tablet Take 1 tablet (500 mg total) by mouth 2 (two) times daily. For mood stabilization Patient not taking: No sig reported 03/02/20   Charm Rings, NP  hydrOXYzine (ATARAX/VISTARIL) 25 MG tablet Take 1 tablet (25 mg total) by mouth 3 (three) times daily as needed for anxiety. Patient not taking: No sig reported 03/02/20   Charm Rings, NP  QUEtiapine (SEROQUEL) 300 MG tablet Take 2 tablets (600 mg total) by mouth at bedtime. For mood control Patient not taking: No sig reported  03/02/20   Charm Rings, NP  traZODone (DESYREL) 50 MG tablet Take 1 tablet (50 mg total) by mouth at bedtime as needed for sleep. Patient not taking: No sig reported 03/02/20   Charm Rings, NP    Allergies Haldol [haloperidol lactate], Abilify [aripiprazole], Penicillins, and Zyprexa [olanzapine]  Family History  Problem Relation Age of Onset  . Mental illness Brother   . Drug abuse Brother   . Mental illness Cousin   . Suicidality Cousin   . Diabetes Maternal Grandmother     Social History Social History   Tobacco Use  . Smoking status: Current Some Day Smoker    Packs/day: 0.25  . Smokeless tobacco: Never Used  Vaping Use  . Vaping Use: Never used  Substance Use Topics  . Alcohol use: Yes  . Drug use: Yes    Types: Marijuana    Review of Systems  Constitutional: No fever/chills Eyes: No visual changes. ENT: No sore throat. Cardiovascular: Denies chest pain. Respiratory: Denies shortness of breath. Gastrointestinal: No abdominal pain.  No nausea, no vomiting.  No diarrhea.  No constipation. Genitourinary: Negative for dysuria. Musculoskeletal: Negative for back pain. Skin: Negative for rash. Neurological: Negative for headaches, focal weakness or numbness. Psychiatric:  Positive for psychosis.  ____________________________________________   PHYSICAL EXAM:  VITAL SIGNS: ED Triage Vitals  Enc Vitals Group     BP 08/18/20 2243 92/74     Pulse Rate 08/18/20 2243 60     Resp 08/18/20 2243 18     Temp 08/18/20 2243 97.9 F (36.6 C)  Temp Source 08/18/20 2243 Oral     SpO2 08/18/20 2243 100 %     Weight 08/18/20 2249 200 lb (90.7 kg)     Height 08/18/20 2249 5\' 10"  (1.778 m)     Head Circumference --      Peak Flow --      Pain Score 08/18/20 2248 0     Pain Loc --      Pain Edu? --      Excl. in GC? --     Constitutional: Alert and oriented. Well appearing and in no acute distress. Eyes: Conjunctivae are normal. PERRL. EOMI. Head:  Atraumatic. Nose: No congestion/rhinnorhea. Mouth/Throat: Mucous membranes are moist.  Oropharynx non-erythematous. Neck: No stridor.   Cardiovascular: Normal rate, regular rhythm. Grossly normal heart sounds.  Good peripheral circulation. Respiratory: Normal respiratory effort.  No retractions. Lungs CTAB. Gastrointestinal: Soft and nontender. No distention. No abdominal bruits. No CVA tenderness. Musculoskeletal: No lower extremity tenderness nor edema.  No joint effusions. Neurologic:  Normal speech and language. No gross focal neurologic deficits are appreciated. No gait instability. Skin:  Skin is warm, dry and intact. No rash noted. Psychiatric: Mood and affect are flat. Speech and behavior are paranoid.  ____________________________________________   LABS (all labs ordered are listed, but only abnormal results are displayed)  Labs Reviewed  COMPREHENSIVE METABOLIC PANEL  ETHANOL  SALICYLATE LEVEL  ACETAMINOPHEN LEVEL  CBC  URINE DRUG SCREEN, QUALITATIVE ([facility] ONLY)   ____________________________________________  EKG  None ____________________________________________  RADIOLOGY I, Kumiko Fishman J, personally viewed and evaluated these images (plain radiographs) as part of my medical decision making, as well as reviewing the written report by the radiologist.  ED MD interpretation: None  Official radiology report(s): No results found.  ____________________________________________   PROCEDURES  Procedure(s) performed (including Critical Care):  Procedures   ____________________________________________   INITIAL IMPRESSION / ASSESSMENT AND PLAN / ED COURSE  As part of my medical decision making, I reviewed the following data within the electronic MEDICAL RECORD NUMBER Nursing notes reviewed and incorporated, Labs reviewed, Old chart reviewed, A consult was requested and obtained from this/these consultant(s) Psychiatry and Notes from prior ED visits     25 year old  male schizophrenic presenting under IVC for psychosis. The patient has been placed in psychiatric observation due to the need to provide a safe environment for the patient while obtaining psychiatric consultation and evaluation, as well as ongoing medical and medication management to treat the patient's condition.  The patient has been placed under full IVC at this time.       ____________________________________________   FINAL CLINICAL IMPRESSION(S) / ED DIAGNOSES  Final diagnoses:  Schizophrenia, unspecified type (HCC)  Psychosis, unspecified psychosis type [facility])     ED Discharge Orders    None      *Please note:  KAESYN JOHNSTON was evaluated in Emergency Department on 08/18/2020 for the symptoms described in the history of present illness. He was evaluated in the context of the global COVID-19 pandemic, which necessitated consideration that the patient might be at risk for infection with the SARS-CoV-2 virus that causes COVID-19. Institutional protocols and algorithms that pertain to the evaluation of patients at risk for COVID-19 are in a state of rapid change based on information released by regulatory bodies including the CDC and federal and state organizations. These policies and algorithms were followed during the patient's care in the ED.  Some ED evaluations and interventions may be delayed as a result of limited staffing during and the  pandemic.*   Note:  This document was prepared using Dragon voice recognition software and may include unintentional dictation errors.   Irean Hong, MD 08/19/20 [phone]
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
## 2020-08-18 NOTE — ED Notes (Signed)
Pt refusing to be dressed out at this time. Pt refusing blood work as well.
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
## 2020-08-19 DIAGNOSIS — F250 Schizoaffective disorder, bipolar type: Secondary | ICD-10-CM

## 2020-08-19 LAB — COMPREHENSIVE METABOLIC PANEL
ALT: 19 U/L (ref 0–44)
AST: 22 U/L (ref 15–41)
Albumin: 3.8 g/dL (ref 3.5–5.0)
Alkaline Phosphatase: 37 U/L — ABNORMAL LOW (ref 38–126)
Anion gap: 6 (ref 5–15)
BUN: 21 mg/dL — ABNORMAL HIGH (ref 6–20)
CO2: 26 mmol/L (ref 22–32)
Calcium: 8.8 mg/dL — ABNORMAL LOW (ref 8.9–10.3)
Chloride: 106 mmol/L (ref 98–111)
Creatinine, Ser: 1.12 mg/dL (ref 0.61–1.24)
GFR, Estimated: >60 mL/min (ref >60)
Glucose, Bld: 115 mg/dL — ABNORMAL HIGH (ref 70–99)
Potassium: 3.7 mmol/L (ref 3.5–5.1)
Sodium: 138 mmol/L (ref 135–145)
Total Bilirubin: 0.9 mg/dL (ref 0.3–1.2)
Total Protein: 6.9 g/dL (ref 6.5–8.1)

## 2020-08-19 LAB — ACETAMINOPHEN LEVEL: Acetaminophen (Tylenol), Serum: <10 ug/mL — ABNORMAL LOW (ref 10–30)

## 2020-08-19 LAB — CBC
HCT: 41.1 % (ref 39.0–52.0)
Hemoglobin: 13.6 g/dL (ref 13.0–17.0)
MCH: 29.8 pg (ref 26.0–34.0)
MCHC: 33.1 g/dL (ref 30.0–36.0)
MCV: 90.1 fL (ref 80.0–100.0)
Platelets: 196 10*3/uL (ref 150–400)
RBC: 4.56 MIL/uL (ref 4.22–5.81)
RDW: 12.4 % (ref 11.5–15.5)
WBC: 6.4 10*3/uL (ref 4.0–10.5)
nRBC: 0 % (ref 0.0–0.2)

## 2020-08-19 LAB — RESP PANEL BY RT-PCR (FLU A&B, COVID) ARPGX2
Influenza A by PCR: NEGATIVE
Influenza B by PCR: NEGATIVE
SARS Coronavirus 2 by RT PCR: NEGATIVE

## 2020-08-19 LAB — ETHANOL: Alcohol, Ethyl (B): <10 mg/dL (ref <10)

## 2020-08-19 LAB — SALICYLATE LEVEL: Salicylate Lvl: <7 mg/dL — ABNORMAL LOW (ref 7.0–30.0)

## 2020-08-19 MED ORDER — QUETIAPINE FUMARATE 25 MG PO TABS
300.0000 mg | ORAL_TABLET | Freq: Every day | ORAL | Status: DC
  Administered 2020-08-19: 300 mg via ORAL
  Filled 2020-08-19: qty 1

## 2020-08-19 MED ORDER — DIVALPROEX SODIUM 500 MG PO DR TAB
750.0000 mg | DELAYED_RELEASE_TABLET | Freq: Two times a day (BID) | ORAL | Status: DC
  Administered 2020-08-19 – 2020-08-20 (×3): 750 mg via ORAL
  Filled 2020-08-19 (×3): qty 1

## 2020-08-19 NOTE — BH Assessment (Signed)
Referral information for Psychiatric Hospitalization faxed to:  [facility]- No bed available due to high acuity on unit   . [facility] [phone])- Pt chatted to [facility]) for review  [facility] [phone]- [phone]),   [address] [phone]),   [facility] [phone] -or- [phone]),   [facility] [phone] -[phone]) [phone]  [facility]arl[facility] Plater [phone]),   [facility] [phone] or [phone])  [facility] [phone] or [phone])  [facility] [phone]).

## 2020-08-19 NOTE — ED Notes (Signed)
IVC pending placement 

## 2020-08-19 NOTE — ED Notes (Signed)
Staff heard banging coming from shower.  Pt was told by NT that the soap dispenser did not work and he had liquid and bar soap in his basin.  Pt replied, "Ok."

## 2020-08-19 NOTE — ED Notes (Signed)
Meal tray given 

## 2020-08-19 NOTE — ED Notes (Signed)
Pt knocked doors off of room 2 and placed them against the wall outside of the room.

## 2020-08-19 NOTE — ED Notes (Signed)
Pt given drink 
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
## 2020-08-19 NOTE — ED Notes (Signed)
Pt given activity book and crayon.

## 2020-08-19 NOTE — ED Notes (Addendum)
Pt given crackers.  

## 2020-08-19 NOTE — ED Notes (Signed)
Breakfast tray given. °

## 2020-08-19 NOTE — BH Assessment (Signed)
Comprehensive Clinical Assessment (CCA) Note  08/19/2020 Timothy FeltyMarkie T [name] [id number]  Chief Complaint: Patient is a 25 year old male presenting [facility] ED under IVC. Per triage note Pt to ED EMS for schizophrenia, not complaint with meds. Waiting on IVC papers from [place]. Pt non compliant. Pt responding "fuck my dad" to questions. Pt in triage not cooperative with questions, mumbling about people setting him up and fighting with hands. Not answering questions because he "doesn't want to." During assessment patient appears alert and oriented x4, calm and cooperative. When asked why patient was presenting to the ED patient reported "I called the police." Patient reports current SI and reports "I had a plan to hurt myself" but patient would not report what that exact plan was. Patient also reports HI "I just want to fight" but does not report who in particular he wants to hurt. Patient denies AH but reports VH "I don't know how to explain it." Patient does report a lack of sleep and no appetite. Patient reports that he does take his medications but is not seeing a psychiatrist. Patient also reports using alcohol and marijuana today "today I had a couple of shots." Patient is currently refusing lab work therefore no BAL is available at this time. Patient continues to report SI/HI/VH, denies AH but appears to be responding to internal stimuli.   Patient disposition pending Chief Complaint  Patient presents with  . Psychiatric Evaluation   Visit Diagnosis: Schizoaffective Disorder   CCA Screening, Triage and Referral (STR)  Patient Reported Information How did you hear about us? Other (Comment)  [facility]  Referral phone number: No data recorded  Whom do you see for routine medical problems? Other (Comment)  Practice/Facility Name: No data recorded Practice/Facility Phone Number: No data recorded Name of Contact: No data recorded Contact Number: No data recorded Contact Fax  Number: No data recorded Prescriber Name: No data recorded Prescriber Address (if known): No data recorded  What Is the Reason for Your Visit/Call Today? No data recorded How Long Has This Been Causing You Problems? > than 6 months  What Do You Feel Would Help You the Most Today? Assessment Only; Therapy; Medication   Have You Recently Been in Any Inpatient Treatment (Hospital/Detox/Crisis Center/28-Day Program)? No  Name/Location of Program/Hospital:[place]  How Long Were You There? No data recorded When Were You Discharged? No data recorded  Have You [facility] Received [facility] Before? Yes  [facility]? Inpatient treatment   Have You Recently Had Any Thoughts About Hurting Yourself? Yes  Are You Planning to Commit Suicide/Harm Yourself At This time? Yes   Have you Recently Had Thoughts About Hurting Someone Karolee Ohslse? Yes  Explanation: No data recorded  Have You Used Any Alcohol or Drugs in the Past 24 Hours? Yes  How Long Ago Did You Use Drugs or Alcohol? 0141  What Did You Use and How Much? Alcohol, marijuana   Do You Currently Have a Therapist/Psychiatrist? No  Name of Therapist/Psychiatrist: No data recorded  Have You Been Recently Discharged From Any Office Practice or Programs? No  Explanation of Discharge From Practice/Program: No data recorded    CCA Screening Triage Referral Assessment Type of Contact: Face-to-Face  Is this Initial or Reassessment? No data recorded Date Telepsych consult ordered in [facility]:  12/15/2019  Time Telepsych consult ordered in [facility]:  No data recorded  Patient Reported Information Reviewed? Yes  Patient Left Without Being Seen? No data  recorded Reason for Not Completing Assessment: No data recorded  Collateral Involvement: No data recorded  Does Patient Have a Court Appointed Legal Guardian? No data recorded Name and Contact of Legal Guardian: No data recorded If Minor and Not Living with  Parent(s), Who has Custody? No data recorded Is CPS involved or ever been involved? Never  Is APS involved or ever been involved? Never   Patient Determined To Be At Risk for Harm To Self or Others Based on Review of Patient Reported Information or Presenting Complaint? Yes, for Self-Harm  Method: No data recorded Availability of Means: No data recorded Intent: No data recorded Notification Required: No data recorded Additional Information for Danger to Others Potential: No data recorded Additional Comments for Danger to Others Potential: No data recorded Are There Guns or Other Weapons in Your Home? No  Types of Guns/Weapons: No data recorded Are These Weapons Safely Secured?                            No data recorded Who Could Verify You Are Able To Have These Secured: No data recorded Do You Have any Outstanding Charges, Pending Court Dates, Parole/Probation? No data recorded Contacted To Inform of Risk of Harm To Self or Others: No data recorded  Location of Assessment: [facility]   Does Patient Present under Involuntary Commitment? Yes  IVC Papers Initial File Date: 08/19/2020   [place] of Residence: [place]   Patient Currently Receiving the Following Services: No data recorded  Determination of Need: Emergent (2 hours)   Options For Referral: Medication Management     CCA Biopsychosocial Intake/Chief Complaint:  Patient is presenting under IVC, patient has a history of Schizoaffective Disorder and is currently non compliant with medications  Current Symptoms/Problems: Patient reports current SI, HI, and VH   Patient Reported Schizophrenia/Schizoaffective Diagnosis in Past: Yes   Strengths: Unknown  Preferences: Unknown  Abilities: Unknown   Type of Services Patient Feels are Needed: Unknown   Initial Clinical Notes/Concerns: None   Mental Health Symptoms Depression:  Increase/decrease in appetite; Change in energy/activity   Duration of  Depressive symptoms: Greater than two weeks   Mania:  None   Anxiety:   Irritability   Psychosis:  Hallucinations; Delusions   Duration of Psychotic symptoms: Greater than six months   Trauma:  None   Obsessions:  None   Compulsions:  None   Inattention:  None   Hyperactivity/Impulsivity:  N/A   Oppositional/Defiant Behaviors:  None   Emotional Irregularity:  None   Other Mood/Personality Symptoms:  No data recorded   Mental Status Exam Appearance and self-care  Stature:  Average   Weight:  Average weight   Clothing:  Casual   Grooming:  Normal   Cosmetic use:  None   Posture/gait:  Normal   Motor activity:  Not Remarkable   Sensorium  Attention:  Normal   Concentration:  Normal   Orientation:  X5   Recall/memory:  Normal   Affect and Mood  Affect:  Flat   Mood:  Depressed   Relating  Eye contact:  Normal   Facial expression:  Depressed   Attitude toward examiner:  Cooperative   Thought and Language  Speech flow: Clear and Coherent   Thought content:  Appropriate to Mood and Circumstances   Preoccupation:  None   Hallucinations:  Visual; Auditory   Organization:  No data recorded  [employer] of Knowledge:  Fair  Intelligence:  Average   Abstraction:  Normal   Judgement:  Poor   Reality Testing:  Adequate   Insight:  Lacking; Poor   Decision Making:  Impulsive   Social Functioning  Social Maturity:  Isolates   Social Judgement:  Heedless   Stress  Stressors:  Other (Comment)   Coping Ability:  Exhausted   Skill Deficits:  None   Supports:  Other (Comment)     Religion: Religion/Spirituality Are You A Religious Person?: No  Leisure/Recreation: Leisure / Recreation Do You Have Hobbies?: No  Exercise/Diet: Exercise/Diet Do You Exercise?: No Have You Gained or Lost A Significant Amount of Weight in the Past Six Months?: No Do You Follow a Special Diet?: No Do You Have Any Trouble Sleeping?:  Yes Explanation of Sleeping Difficulties: Patient reports difficulty sleeping   CCA Employment/Education Employment/Work Situation: Employment / Work Situation Employment situation: Unemployed Patient's job has been impacted by current illness:  (Unknown) What is the longest time patient has a held a job?: Unknown Where was the patient employed at that time?: Unknown Has patient ever been in the [employer]?: No  Education: Education Is Patient Currently Attending School?: No Did Garment/textile technologist From [employer]?:  (Unknown) Did You Have An Individualized Education Program (IIEP): No Did You Have Any Difficulty At [employer]?: No Patient's Education Has Been Impacted by Current Illness: No   CCA Family/Childhood History Family and Relationship History: Family history Marital status:  (Unknown) Are you sexually active?:  (Unknown) What is your sexual orientation?: Unknown Has your sexual activity been affected by drugs, alcohol, medication, or emotional stress?: Unknown Does patient have children?:  (Unknown)  Childhood History:  Childhood History By whom was/is the patient raised?:  (Unknown) Additional childhood history information: None reported Description of patient's relationship with caregiver when they were a child: None reported Patient's description of current relationship with people who raised him/her: None reported How were you disciplined when you got in trouble as a child/adolescent?: None reported Does patient have siblings?:  (None reported) Did patient suffer any verbal/emotional/physical/sexual abuse as a child?:  (UTA) Did patient suffer from severe childhood neglect?:  (UTA) Has patient ever been sexually abused/assaulted/raped as an adolescent or adult?:  (UTA) Was the patient ever a victim of a crime or a disaster?:  (UTA) Witnessed domestic violence?:  (UTA) Has patient been affected by domestic violence as an adult?:  Industrial/product designer)  Child/Adolescent Assessment:      CCA Substance Use Alcohol/Drug Use: Alcohol / Drug Use Pain Medications: See MAR Prescriptions: See MAR Over the Counter: See MAR History of alcohol / drug use?: Yes Substance #1 Name of Substance 1: Alcohol 1 - Amount (size/oz): "A couple of shots" 1 - Last Use / Amount: 08/18/20 1- Route of Use: Oral Substance #2 Name of Substance 2: Marijuana                     ASAM's:  Six Dimensions of Multidimensional Assessment  Dimension 1:  Acute Intoxication and/or Withdrawal Potential:      Dimension 2:  Biomedical Conditions and Complications:      Dimension 3:  Emotional, Behavioral, or Cognitive Conditions and Complications:     Dimension 4:  Readiness to Change:     Dimension 5:  Relapse, Continued use, or Continued Problem Potential:     Dimension 6:  Recovery/Living Environment:     ASAM Severity Score:    ASAM Recommended Level of Treatment:     Substance use Disorder (  SUD)    Recommendations for Services/Supports/Treatments:  Patient disposition pending  DSM5 Diagnoses: Patient Active Problem List   Diagnosis Date Noted  . Benzodiazepine abuse (HCC) 08/13/2016  . Schizoaffective disorder, depressive type (HCC) 08/12/2016  . Schizoaffective disorder, bipolar type (HCC) 09/18/2014  . Tobacco use disorder 09/18/2014  . 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 Cannabis use disorder, moderate, dependence (HCC) 09/17/2014    Patient Centered Plan: Patient is on the following Treatment Plan(s):  Substance Abuse, Schizoaffective Disorder   Referrals to Alternative Service(s): Referred to Alternative Service(s):   Place:   Date:   Time:    Referred to Alternative Service(s):   Place:   Date:   Time:    Referred to Alternative Service(s):   Place:   Date:   Time:    Referred to Alternative Service(s):   Place:   Date:   Time:     Annalena Piatt A Louis Ivery, LCAS-A

## 2020-08-19 NOTE — ED Notes (Signed)
Hourly rounding reveals patient in room. No complaints, stable, in no acute distress. Q15 minute rounds and monitoring via Rover and Officer to continue.   

## 2020-08-19 NOTE — ED Notes (Signed)
Pt. Alert and oriented, warm and dry, in no distress. Pt. Denies SI, HI, and AVH. Patient was writing on wall with crayon. Patient was asked by writer not to write on walls. Pt. Encouraged to let nursing staff know of any concerns or needs.  ENVIRONMENTAL ASSESSMENT Potentially harmful objects out of patient reach: Yes.   Personal belongings secured: Yes.   Patient dressed in hospital provided attire only: Yes.   Plastic bags out of patient reach: Yes.   Patient care equipment (cords, cables, call bells, lines, and drains) shortened, removed, or accounted for: Yes.   Equipment and supplies removed from bottom of stretcher: Yes.   Potentially toxic materials out of patient reach: Yes.   Sharps container removed or out of patient reach: Yes.

## 2020-08-19 NOTE — ED Notes (Signed)
Sandwich and soft drink given.  

## 2020-08-19 NOTE — ED Notes (Signed)
Pt. Transferred from Triage to room 20 after dressing out and screening for contraband. Report to include Situation, Background, Assessment and Recommendations from [facility]. Pt. Oriented to [place] including Q15 minute rounds as well as Psychologist, counselling for their protection. Patient is alert and oriented, warm and dry in no acute distress. Patient reported SI, HI, and AVH. Pt. Encouraged to let me know if needs arise.

## 2020-08-19 NOTE — ED Notes (Signed)
Pt wandered into another (empty) room.  Security officer asked pt to return to his assigned room.

## 2020-08-20 ENCOUNTER — Inpatient Hospital Stay
Admission: RE | Admit: 2020-08-20 | Discharge: 2020-08-26 | DRG: 885 | Disposition: A | Discharge status: Home / Self Care | Payer: 59 | Source: Intra-hospital | Attending: Behavioral Health | Admitting: Behavioral Health

## 2020-08-20 ENCOUNTER — Encounter: Payer: Self-pay | Admitting: Psychiatry

## 2020-08-20 ENCOUNTER — Other Ambulatory Visit: Payer: Self-pay

## 2020-08-20 DIAGNOSIS — F1220 Cannabis dependence, uncomplicated: Secondary | ICD-10-CM | POA: Diagnosis present

## 2020-08-20 DIAGNOSIS — R45851 Suicidal ideations: Secondary | ICD-10-CM | POA: Diagnosis present

## 2020-08-20 DIAGNOSIS — F251 Schizoaffective disorder, depressive type: Principal | ICD-10-CM | POA: Diagnosis present

## 2020-08-20 DIAGNOSIS — F17200 Nicotine dependence, unspecified, uncomplicated: Secondary | ICD-10-CM | POA: Diagnosis present

## 2020-08-20 DIAGNOSIS — Z9151 Personal history of suicidal behavior: Secondary | ICD-10-CM | POA: Diagnosis not present

## 2020-08-20 DIAGNOSIS — G4700 Insomnia, unspecified: Secondary | ICD-10-CM | POA: Diagnosis present

## 2020-08-20 DIAGNOSIS — F17210 Nicotine dependence, cigarettes, uncomplicated: Secondary | ICD-10-CM | POA: Diagnosis present

## 2020-08-20 DIAGNOSIS — Z880 Allergy status to penicillin: Secondary | ICD-10-CM | POA: Diagnosis not present

## 2020-08-20 DIAGNOSIS — Z818 Family history of other mental and behavioral disorders: Secondary | ICD-10-CM

## 2020-08-20 DIAGNOSIS — F29 Unspecified psychosis not due to a substance or known physiological condition: Secondary | ICD-10-CM | POA: Diagnosis present

## 2020-08-20 DIAGNOSIS — F250 Schizoaffective disorder, bipolar type: Secondary | ICD-10-CM

## 2020-08-20 MED ORDER — QUETIAPINE FUMARATE 200 MG PO TABS: 300.0000 mg | ORAL_TABLET | Freq: Every day | ORAL | Status: DC

## 2020-08-20 MED ORDER — DIVALPROEX SODIUM 500 MG PO DR TAB: 750.0000 mg | DELAYED_RELEASE_TABLET | Freq: Two times a day (BID) | ORAL | Status: DC

## 2020-08-20 MED ORDER — QUETIAPINE FUMARATE 200 MG PO TABS
400.0000 mg | ORAL_TABLET | Freq: Every day | ORAL | Status: DC
  Administered 2020-08-20 – 2020-08-21 (×2): 400 mg via ORAL
  Filled 2020-08-20 (×2): qty 2

## 2020-08-20 MED ORDER — MAGNESIUM HYDROXIDE 400 MG/5ML PO SUSP: 30.0000 mL | Freq: Every day | ORAL | Status: DC | PRN

## 2020-08-20 MED ORDER — ALUM & MAG HYDROXIDE-SIMETH 200-200-20 MG/5ML PO SUSP
30.0000 mL | ORAL | Status: DC | PRN
Start: 2020-08-20 — End: 2020-08-26
  Administered 2020-08-21: 30 mL via ORAL
  Filled 2020-08-20 (×2): qty 30

## 2020-08-20 MED ORDER — TRAZODONE HCL 50 MG PO TABS
50.0000 mg | ORAL_TABLET | Freq: Every evening | ORAL | Status: DC | PRN
  Administered 2020-08-20 – 2020-08-23 (×3): 50 mg via ORAL
  Filled 2020-08-20 (×4): qty 1

## 2020-08-20 MED ORDER — DIVALPROEX SODIUM 500 MG PO DR TAB
500.0000 mg | DELAYED_RELEASE_TABLET | Freq: Two times a day (BID) | ORAL | Status: DC
  Administered 2020-08-20 – 2020-08-26 (×12): 500 mg via ORAL
  Filled 2020-08-20 (×12): qty 1

## 2020-08-20 MED ORDER — QUETIAPINE FUMARATE 200 MG PO TABS: 600.0000 mg | ORAL_TABLET | Freq: Every day | ORAL | Status: DC

## 2020-08-20 MED ORDER — ACETAMINOPHEN 325 MG PO TABS
650.0000 mg | ORAL_TABLET | Freq: Four times a day (QID) | ORAL | Status: DC | PRN
  Administered 2020-08-20 – 2020-08-25 (×5): 650 mg via ORAL
  Filled 2020-08-20 (×5): qty 2

## 2020-08-20 NOTE — Progress Notes (Signed)
Pt was brought in IVC after an encounter with the police. He states he had been drinking, smoking, and taking "meds" together and he cannot recall the details of the encounter with the police.   Pt endorses auditory and visual hallucinations, stating that he hears someone talking, but they are not telling him to kill anyone. He also says he sees people who are dead. These hallucinations cause him to want to hurt himself. He currently endorses self harm thoughts.   Pt has a history of substance abuse and regularly uses marijuana. He also says he drinks a pint of alcohol every week or two weeks and has taken ecstasy.  Pt was calm and cooperative with the admission assessment. He has a flat affect and can be disorganized at times.   Pt remains safe on the unit at this time. Q15 minute safety checks are maintained.

## 2020-08-20 NOTE — ED Notes (Signed)
Linen supplies given.

## 2020-08-20 NOTE — [facility] Group Notes (Signed)
[facility] Group Notes:  (Nursing/MHT/Case Management/Adjunct)  Date:  08/20/2020  Time:  9:54 PM  Type of Therapy:  Group Therapy  Participation Level:  Active  Participation Quality:  Appropriate  Affect:  Appropriate  Cognitive:  Alert  Insight:  Good  Engagement in Group:  Engaged and no goals.  Modes of Intervention:  Support  Summary of Progress/Problems:  Timothy [name] 08/20/2020, 9:54 PM

## 2020-08-20 NOTE — Tx Team (Signed)
Initial Treatment Plan 08/20/2020 4:44 PM [name] ZOX:[id number]    PATIENT STRESSORS: Medication change or noncompliance Substance abuse Other: grandparent's health   PATIENT STRENGTHS: Average or above average intelligence Communication skills Financial means General fund of knowledge Motivation for treatment/growth Physical Health Supportive family/friends Work skills   PATIENT IDENTIFIED PROBLEMS: Paranoia   Non med compliance  Substance abuse                 DISCHARGE CRITERIA:  Improved stabilization in mood, thinking, and/or behavior Motivation to continue treatment in a less acute level of care Need for constant or close observation no longer present Verbal commitment to aftercare and medication compliance  PRELIMINARY DISCHARGE PLAN: Attend aftercare/continuing care group Outpatient therapy Return to previous living arrangement Return to previous work or school arrangements  PATIENT/FAMILY INVOLVEMENT: This treatment plan has been presented to and reviewed with the patient, Timothy [name]. The patient has been given the opportunity to ask questions and make suggestions.  Chalmers Cater, RN 08/20/2020, 4:44 PM

## 2020-08-20 NOTE — BH Assessment (Addendum)
Referral chekcs:  . [facility]- No bed available due to high acuity on unit   . [facility] [phone])- Per [facility] no appropriate bed available  [place] [place] [phone]), Kenard Gower reports denied due to no insurance  [facility] [phone]), Referral has not yet been reviewed, check back in the AM  [facility] [phone] -or- [phone]), Leighton Parody reports denied due to no insurance  [facility] [phone] -or- [phone]) [phone] No overnight intake staff, staff are available after 7am  Earlene Plater ([phone]), No answer from any of the 3 numbers   [place] [phone] or [phone]) Not accepting referrals at this time  [facility] [phone] or [phone]) No answer, voicemail was left  [facility]er Daniels [phone]). No answer, voicemail was left

## 2020-08-20 NOTE — Consult Note (Signed)
[name] Face-to-Face Psychiatry Consult   Reason for Consult: Follow-up for 25 year old man with schizophrenia in the emergency room awaiting admission Referring Physician: Siadecki Patient Identification: Timothy [name] MRN:  [id number] Principal Diagnosis: Schizoaffective disorder, bipolar type (HCC) Diagnosis:  Principal Problem:   Schizoaffective disorder, bipolar type (HCC) Active Problems:   Cannabis use disorder, moderate, dependence (HCC)   Total Time spent with patient: 30 minutes  Subjective:   Timothy [name] is a 25 y.o. male patient admitted with "I am okay".  HPI: Patient seen chart reviewed.  Patient has been sleeping much of the day.  He is eating.  Takes care of basic hygiene.  No new complaints.  Remains disorganized and somewhat paranoid in his speech.  Labs such as they are unremarkable at this point vitals stable.  Past Psychiatric History: History of schizoaffective disorder and cannabis abuse  Risk to Self:   Risk to Others:   Prior Inpatient Therapy:   Prior Outpatient Therapy:    Past Medical History:  Past Medical History:  Diagnosis Date  . Cannabis abuse   . Paranoid schizophrenia (HCC)   . Schizoaffective disorder, depressive type (HCC) 09/17/2014   History reviewed. No pertinent surgical history. Family History:  Family History  Problem Relation Age of Onset  . Mental illness Brother   . Drug abuse Brother   . Mental illness Cousin   . Suicidality Cousin   . Diabetes Maternal Grandmother    Family Psychiatric  History: See previous Social History:  Social History   Substance and Sexual Activity  Alcohol Use Yes     Social History   Substance and Sexual Activity  Drug Use Yes  . Types: Marijuana    Social History   Socioeconomic History  . Marital status: Single    Spouse name: Not on file  . Number of children: Not on file  . Years of education: Not on file  . Highest education level: Not on file  Occupational History  . Not on  file  Tobacco Use  . Smoking status: Current Some Day Smoker    Packs/day: 0.25  . Smokeless tobacco: Never Used  Vaping Use  . Vaping Use: Never used  Substance and Sexual Activity  . Alcohol use: Yes  . Drug use: Yes    Types: Marijuana  . Sexual activity: Yes  Other Topics Concern  . Not on file  Social History Narrative  . Not on file   Social Determinants of Health   Financial Resource Strain: Low Risk   . Difficulty of Paying Living Expenses: Not hard at all  Food Insecurity: No Food Insecurity  . Worried About Programme researcher, broadcasting/film/video in the Last Year: Never true  . Ran Out of Food in the Last Year: Never true  Transportation Needs: No Transportation Needs  . Lack of Transportation (Medical): No  . Lack of Transportation (Non-Medical): No  Physical Activity: Inactive  . Days of Exercise per Week: 0 days  . Minutes of Exercise per Session: 0 min  Stress: Stress Concern Present  . Feeling of Stress : Rather much  Social Connections: Moderately Isolated  . Frequency of Communication with Friends and Family: Once a week  . Frequency of Social Gatherings with Friends and Family: Once a week  . Attends Religious Services: More than 4 times per year  . Active Member of Clubs or Organizations: Yes  . Attends Banker Meetings: More than 4 times per year  . Marital Status: Never married  Additional Social History:    Allergies:   Allergies  Allergen Reactions  . Haldol [Haloperidol Lactate] Other (See Comments)    Muscle stiffness  . Abilify [Aripiprazole]     eps  . Penicillins Hives    Felt like throat was closing Has patient had a PCN reaction causing immediate rash, facial/tongue/throat swelling, SOB or lightheadedness with hypotension: Unknown Has patient had a PCN reaction causing severe rash involving mucus membranes or skin necrosis: unknown Has patient had a PCN reaction that required hospitalization Unknown Has patient had a PCN reaction  occurring within the last 10 years: Unknown If all of the above answers are "NO", then may proceed with Cephalosporin use.   . Zyprexa [Olanzapine]     eps    Labs:  Results for orders placed or performed during the hospital encounter of 08/18/20 (from the past 48 hour(s))  Comprehensive metabolic panel     Status: Abnormal   Collection Time: 08/19/20  2:28 AM  Result Value Ref Range   Sodium 138 135 - 145 mmol/L   Potassium 3.7 3.5 - 5.1 mmol/L   Chloride 106 98 - 111 mmol/L   CO2 26 22 - 32 mmol/L   Glucose, Bld 115 (H) 70 - 99 mg/dL    Comment: Glucose reference range applies only to samples taken after fasting for at least 8 hours.   BUN 21 (H) 6 - 20 mg/dL   Creatinine, Ser 7.26 0.61 - 1.24 mg/dL   Calcium 8.8 (L) 8.9 - 10.3 mg/dL   Total Protein 6.9 6.5 - 8.1 g/dL   Albumin 3.8 3.5 - 5.0 g/dL   AST 22 15 - 41 U/L   ALT 19 0 - 44 U/L   Alkaline Phosphatase 37 (L) 38 - 126 U/L   Total Bilirubin 0.9 0.3 - 1.2 mg/dL   GFR, Estimated >20 >35 mL/min    Comment: (NOTE) Calculated using the CKD-EPI Creatinine Equation (2021)    Anion gap 6 5 - 15    Comment: Performed at [facility], [address] [place], [place] [address]  Ethanol     Status: None   Collection Time: 08/19/20  2:28 AM  Result Value Ref Range   Alcohol, Ethyl (B) <10 <10 mg/dL    Comment: (NOTE) Lowest detectable limit for serum alcohol is 10 mg/dL.  For medical purposes only. Performed at [facility], [address] [place], [place] [address]   Salicylate level     Status: Abnormal   Collection Time: 08/19/20  2:28 AM  Result Value Ref Range   Salicylate Lvl <7.0 (L) 7.0 - 30.0 mg/dL    Comment: Performed at [facility], [address] [place], [place] [address]  Acetaminophen level     Status: Abnormal   Collection Time: 08/19/20  2:28 AM  Result Value Ref Range   Acetaminophen (Tylenol), Serum <10 (L) 10 - 30 ug/mL    Comment: (NOTE) Therapeutic  concentrations vary significantly. A range of 10-30 ug/mL  may be an effective concentration for many patients. However, some  are best treated at concentrations outside of this range. Acetaminophen concentrations >150 ug/mL at 4 hours after ingestion  and >50 ug/mL at 12 hours after ingestion are often associated with  toxic reactions.  Performed at [facility], [address] [place], [place] [address]   cbc     Status: None   Collection Time: 08/19/20  2:28 AM  Result Value Ref Range   WBC 6.4 4.0 -  10.5 K/uL   RBC 4.56 4.22 - 5.81 MIL/uL   Hemoglobin 13.6 13.0 - 17.0 g/dL   HCT 62.2 29.7 - 98.9 %   MCV 90.1 80.0 - 100.0 fL   MCH 29.8 26.0 - 34.0 pg   MCHC 33.1 30.0 - 36.0 g/dL   RDW 21.1 94.1 - 74.0 %   Platelets 196 150 - 400 K/uL   nRBC 0.0 0.0 - 0.2 %    Comment: Performed at [facility], [address], [place], [place] [address]  Resp Panel by RT-PCR (Flu A&B, Covid) Nasopharyngeal Swab     Status: None   Collection Time: 08/19/20  5:06 AM   Specimen: Nasopharyngeal Swab; Nasopharyngeal(NP) swabs in vial transport medium  Result Value Ref Range   SARS Coronavirus 2 by RT PCR NEGATIVE NEGATIVE    Comment: (NOTE) SARS-CoV-2 target nucleic acids are NOT DETECTED.  The SARS-CoV-2 RNA is generally detectable in upper respiratory specimens during the acute phase of infection. The lowest concentration of SARS-CoV-2 viral copies this assay can detect is 138 copies/mL. A negative result does not preclude SARS-Cov-2 infection and should not be used as the sole basis for treatment or other patient management decisions. A negative result may occur with  improper specimen collection/handling, submission of specimen other than nasopharyngeal swab, presence of viral mutation(s) within the areas targeted by this assay, and inadequate number of viral copies(<138 copies/mL). A negative result must be combined with clinical observations, patient history, and  epidemiological information. The expected result is Negative.  Fact Sheet for Patients:  BloggerCourse.com  Fact Sheet for Healthcare Providers:  SeriousBroker.it  This test is no t yet approved or cleared by the [place] FDA and  has been authorized for detection and/or diagnosis of SARS-CoV-2 by FDA under an Emergency Use Authorization (EUA). This EUA will remain  in effect (meaning this test can be used) for the duration of the COVID-19 declaration under Section 564(b)(1) of the Act, 21 U.S.C.section 360bbb-3(b)(1), unless the authorization is terminated  or revoked sooner.       Influenza A by PCR NEGATIVE NEGATIVE   Influenza B by PCR NEGATIVE NEGATIVE    Comment: (NOTE) The Xpert Xpress SARS-CoV-2/FLU/RSV plus assay is intended as an aid in the diagnosis of influenza from Nasopharyngeal swab specimens and should not be used as a sole basis for treatment. Nasal washings and aspirates are unacceptable for Xpert Xpress SARS-CoV-2/FLU/RSV testing.  Fact Sheet for Patients: BloggerCourse.com  Fact Sheet for Healthcare Providers: SeriousBroker.it  This test is not yet approved or cleared by the [place] FDA and has been authorized for detection and/or diagnosis of SARS-CoV-2 by FDA under an Emergency Use Authorization (EUA). This EUA will remain in effect (meaning this test can be used) for the duration of the COVID-19 declaration under Section 564(b)(1) of the Act, 21 U.S.C. section 360bbb-3(b)(1), unless the authorization is terminated or revoked.  Performed at [facility], [address], [place], [place] [address]     Current Facility-Administered Medications  Medication Dose Route Frequency Provider Last Rate Last Admin  . divalproex (DEPAKOTE) DR tablet 750 mg  750 mg Oral Q12H Terris Germano, Jackquline Denmark, MD   750 mg at 08/20/20 0936  . QUEtiapine  (SEROQUEL) tablet 300 mg  300 mg Oral QHS Ascencion Stegner, Jackquline Denmark, MD   300 mg at 08/19/20 2138   Current Outpatient Medications  Medication Sig Dispense Refill  . buPROPion (WELLBUTRIN XL) 150 MG 24 hr tablet Take 1 tablet (150 mg  total) by mouth daily. For depression (Patient not taking: No sig reported) 30 tablet 1  . divalproex (DEPAKOTE) 500 MG DR tablet Take 1 tablet (500 mg total) by mouth 2 (two) times daily. For mood stabilization (Patient not taking: No sig reported) 60 tablet 1  . hydrOXYzine (ATARAX/VISTARIL) 25 MG tablet Take 1 tablet (25 mg total) by mouth 3 (three) times daily as needed for anxiety. (Patient not taking: No sig reported) 75 tablet 1  . QUEtiapine (SEROQUEL) 300 MG tablet Take 2 tablets (600 mg total) by mouth at bedtime. For mood control (Patient not taking: No sig reported) 60 tablet 1  . traZODone (DESYREL) 50 MG tablet Take 1 tablet (50 mg total) by mouth at bedtime as needed for sleep. (Patient not taking: No sig reported) 30 tablet 1    Musculoskeletal: Strength & Muscle Tone: within normal limits Gait & Station: normal Patient leans: N/A  Psychiatric Specialty Exam: Physical Exam Vitals and nursing note reviewed.  Constitutional:      Appearance: He is well-developed and well-nourished.  HENT:     Head: Normocephalic and atraumatic.  Eyes:     Conjunctiva/sclera: Conjunctivae normal.     Pupils: Pupils are equal, round, and reactive to light.  Cardiovascular:     Heart sounds: Normal heart sounds.  Pulmonary:     Effort: Pulmonary effort is normal.  Abdominal:     Palpations: Abdomen is soft.  Musculoskeletal:        General: Normal range of motion.     Cervical back: Normal range of motion.  Skin:    General: Skin is warm and dry.  Neurological:     General: No focal deficit present.     Mental Status: He is alert.  Psychiatric:        Attention and Perception: He is inattentive.        Mood and Affect: Affect is blunt.        Speech: Speech is  delayed.        Behavior: Behavior is slowed.        Thought Content: Thought content is paranoid. Thought content does not include homicidal or suicidal ideation.        Cognition and Memory: Cognition is impaired.        Judgment: Judgment is impulsive.     Review of Systems  Constitutional: Negative.   HENT: Negative.   Eyes: Negative.   Respiratory: Negative.   Cardiovascular: Negative.   Gastrointestinal: Negative.   Musculoskeletal: Negative.   Skin: Negative.   Neurological: Negative.   Psychiatric/Behavioral: Negative.     Blood pressure 140/77, pulse 68, temperature 98.1 F (36.7 C), temperature source Oral, resp. rate (!) 68, height  (1.778 m), weight 90.7 kg, SpO2 100 %.Body mass index is 28.7 kg/m.  General Appearance: Casual  Eye Contact:  Good  Speech:  Clear and Coherent  Volume:  Normal  Mood:  Irritable  Affect:  Constricted  Thought Process:  Disorganized  Orientation:  Full (Time, Place, and Person)  Thought Content:  Illogical  Suicidal Thoughts:  No  Homicidal Thoughts:  No  Memory:  Immediate;   Fair Recent;   Poor Remote;   Fair  Judgement:  Impaired  Insight:  Shallow  Psychomotor Activity:  Decreased  Concentration:  Concentration: Poor  Recall:  Poor  Fund of Knowledge:  Fair  Language:  Fair  Akathisia:  No  Handed:  Right  AIMS (if indicated):     Assets:  Desire  for Improvement Resilience  ADL's:  Impaired  Cognition:  Impaired,  Mild  Sleep:        Treatment Plan Summary: Medication management and Plan Plan continues to be for medical treatment of schizophrenia with admission to the psychiatric unit.  Patient has been accepted and should be transferred today.  Orders will be placed for admission.  Patient informed of plan.  Disposition: Recommend psychiatric Inpatient admission when medically cleared.  Mordecai Rasmussen, MD 08/20/2020 2:08 PM

## 2020-08-20 NOTE — ED Notes (Signed)
Pt is asleep. Vs will be assess when pt is awake. No other needs found at this moment. 

## 2020-08-20 NOTE — ED Notes (Signed)
Report given to receiving nurse. Patient moving to lower level BHU

## 2020-08-20 NOTE — ED Notes (Signed)
Patient out of room to nursing station requesting a shower. Shower and oral hygiene supplies provided. Patient in shower.

## 2020-08-20 NOTE — ED Notes (Signed)
Shower offered patient declined continues to sleep.  

## 2020-08-20 NOTE — BH Assessment (Signed)
Patient is to be admitted to [facility] by Dr. Neale Burly.  Attending Physician will be Dr. Toni Amend.   Patient has been assigned to room 307, by [facility] Charge Nurse Megan.     ER staff is aware of the admission:  Luann, ER Secretary    Dr. Marisa Severin, ER MD   Para March, Patient's Nurse   Ethelene Browns Patient Access.

## 2020-08-20 NOTE — ED Notes (Addendum)
Breakfast tray given. VS obtained. Shower has been offered. 

## 2020-08-21 DIAGNOSIS — F251 Schizoaffective disorder, depressive type: Principal | ICD-10-CM

## 2020-08-21 LAB — URINALYSIS, ROUTINE W REFLEX MICROSCOPIC
Bilirubin Urine: NEGATIVE
Glucose, UA: NEGATIVE mg/dL
Hgb urine dipstick: NEGATIVE
Ketones, ur: NEGATIVE mg/dL
Leukocytes,Ua: NEGATIVE
Nitrite: NEGATIVE
Protein, ur: NEGATIVE mg/dL
Specific Gravity, Urine: 1.016 (ref 1.005–1.030)
pH: 8 (ref 5.0–8.0)

## 2020-08-21 LAB — URINE DRUG SCREEN, QUALITATIVE (ARMC ONLY)
Amphetamines, Ur Screen: NOT DETECTED
Barbiturates, Ur Screen: NOT DETECTED
Benzodiazepine, Ur Scrn: NOT DETECTED
Cannabinoid 50 Ng, Ur ~~LOC~~: POSITIVE — AB
Cocaine Metabolite,Ur ~~LOC~~: NOT DETECTED
MDMA (Ecstasy)Ur Screen: NOT DETECTED
Methadone Scn, Ur: NOT DETECTED
Opiate, Ur Screen: NOT DETECTED
Phencyclidine (PCP) Ur S: NOT DETECTED
Tricyclic, Ur Screen: NOT DETECTED

## 2020-08-21 LAB — HEMOGLOBIN A1C
Hgb A1c MFr Bld: 5.5 % (ref 4.8–5.6)
Mean Plasma Glucose: 111 mg/dL

## 2020-08-21 LAB — LIPID PANEL
Cholesterol: 167 mg/dL (ref 0–200)
HDL: 45 mg/dL (ref >40)
LDL Cholesterol: 107 mg/dL — ABNORMAL HIGH (ref 0–99)
Total CHOL/HDL Ratio: 3.7 RATIO
Triglycerides: 76 mg/dL (ref <150)
VLDL: 15 mg/dL (ref 0–40)

## 2020-08-21 LAB — AMYLASE: Amylase: 121 U/L — ABNORMAL HIGH (ref 28–100)

## 2020-08-21 LAB — LIPASE, BLOOD: Lipase: 34 U/L (ref 11–51)

## 2020-08-21 MED ORDER — BISMUTH SUBSALICYLATE 262 MG PO CHEW
524.0000 mg | CHEWABLE_TABLET | ORAL | Status: DC | PRN
  Administered 2020-08-25: 524 mg via ORAL
  Filled 2020-08-21 (×2): qty 2

## 2020-08-21 NOTE — Progress Notes (Signed)
Recreation Therapy Notes  INPATIENT RECREATION THERAPY ASSESSMENT  Patient Details Name: Timothy [name] MRN: [id number] DOB: 10-Aug-1995 Today's Date: 08/21/2020       Information Obtained From: Patient  Able to Participate in Assessment/Interview: Yes  Patient Presentation: Responsive  Reason for Admission (Per Patient): Active Symptoms,Substance Abuse  Patient Stressors:    Coping Skills:   Banker (2+):  Music - Listen,Music - Write music,Sports - Basketball,Exercise - Adult nurse ([employer])  Frequency of Recreation/Participation: Marketing executive Resources:  Yes  Community Resources:  Gym  Current Use:    If no, Barriers?:    Expressed Interest in [employer] Information:    [employer] of Residence:  [place]  Patient Main Form of Transportation: Therapist, music  Patient Strengths:  encouraging people, making people laugh  Patient Identified Areas of Improvement:  Get treatment  Patient Goal for Hospitalization:  Get better  Current SI (including self-harm):  Yes (No plan)  Current HI:  Yes  Current AVH: Yes  Staff Intervention Plan: Group Attendance,Collaborate with Interdisciplinary Treatment Team  Consent to Intern Participation: N/A  Nekeshia Lenhardt 08/21/2020, 4:21 PM
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
## 2020-08-21 NOTE — Progress Notes (Signed)
Recreation Therapy Notes  Date: 08/21/2020  Time: 10:00 am  Location: Craft room   Behavioral response: Appropriate   Intervention Topic: Animal Assisted Therapy   Discussion/Intervention:  Animal Assisted Therapy took place today during group.  Animal Assisted Therapy is the planned inclusion of an animal in a patient's treatment plan. The patients were able to engage in therapy with an animal during group. Participants were educated on what a service dog is and the different between a support dog and a service dog. Patient were informed on the many animal needs there are and how their needs are similar. Individuals were enlightened on the process to get a service animal or support animal. Patients got the opportunity to pet the animal and were offered emotional support from the animal and staff.  Clinical Observations/Feedback:  Patient came to group and was on topic and was focused on what peers and staff had to say. Participant shared their experiences and history with animals. Individual was social with peers, staff and animal while participating in group.  Greogry Goodwyn LRT/CTRS         Lawarence Meek 08/21/2020 12:32 PM

## 2020-08-21 NOTE — Progress Notes (Signed)
Recreation Therapy Notes  INPATIENT RECREATION TR PLAN  Patient Details Name: Timothy [name] MRN: [id number] DOB: 1996-01-12 Today's Date: 08/21/2020  Rec Therapy Plan Is patient appropriate for Therapeutic Recreation?: Yes Treatment times per week: at least 3 Estimated Length of Stay: 5-7 days TR Treatment/Interventions: Group participation (Comment)  Discharge Criteria Pt will be discharged from therapy if:: Discharged Treatment plan/goals/alternatives discussed and agreed upon by:: Patient/family  Discharge Summary     Cortnie Ringel 08/21/2020, 4:22 PM

## 2020-08-21 NOTE — Progress Notes (Signed)
Pt has been ringing his call bell multiple times throughout the morning/early afternoon. When checking on him, he says that he is afraid of the shadows he is seeing.

## 2020-08-21 NOTE — Progress Notes (Signed)
Patient presented with a flat affect. Pt states he did not sleep well due to sharp mid abdominal pain. Pt has had a recent BM and does not believe the pain is due to constipation. Pt also has right hip pain. Tylenol was given. Pt denies SI and HI but endorses visual hallucinations. He states he saw shadows in his room when he got up.  Medications were given per MD orders, pt was med compliant. Support and encouragement was provided. Pt remains safe on the unit at this time and q15 minute safety checks are maintained.

## 2020-08-21 NOTE — [facility] Suicide Risk Assessment (Signed)
[facility] INPATIENT:  Family/Significant Other Suicide Prevention Education  Suicide Prevention Education:  Education Completed; Gerica Ramsey/mother [phone]), has been identified by the patient as the family member/significant other with whom the patient will be residing, and identified as the person(s) who will aid the patient in the event of a mental health crisis (suicidal ideations/suicide attempt).  With written consent from the patient, the family member/significant other has been provided the following suicide prevention education, prior to the and/or following the discharge of the patient.  The suicide prevention education provided includes the following:  Suicide risk factors  Suicide prevention and interventions  National Suicide Hotline telephone number  [facility] assessment telephone number  [facility] Emergency Assistance 911  [facility] and/or Residential Mobile Crisis Unit telephone number  Request made of family/significant other to:  Remove weapons (e.g., guns, rifles, knives), all items previously/currently identified as safety concern.    Remove drugs/medications (over-the-counter, prescriptions, illicit drugs), all items previously/currently identified as a safety concern.  The family member/significant other verbalizes understanding of the suicide prevention education information provided.  The family member/significant other agrees to remove the items of safety concern listed above.  Reed Pandy stated that she feels that pt has not been taking his medication. She reported that APS contacted her because it sounds like someone reported that the pt needed help. She shared that pt recently lost his job. She denied thinking that pt was a danger to himself or anyone else and denied any access to weapons. She states that pt needs to stop smoking weed. She states that he needs to be somewhere where they assist him with taking medications like a group home.  CSW explained that this is something that would only be an option if pt were interested in this. She stated that pt has Medicaid. Reed Pandy stated that she can always tell when pt is off his medication because he does not want to listen and cannot focus. No other concerns expressed. Contact ended without incident.   [name] 08/21/2020, 4:09 PM

## 2020-08-21 NOTE — [facility] Group Notes (Signed)
LCSW Group Therapy Note     08/21/2020 2:15 PM     Type of Therapy/Topic:  Group Therapy:  Balance in Life     Participation Level:  Minimal     Description of Group:    This group will address the concept of balance and how it feels and looks when one is unbalanced. Patients will be encouraged to process areas in their lives that are out of balance and identify reasons for remaining unbalanced. Facilitators will guide patients in utilizing problem-solving interventions to address and correct the stressor making their life unbalanced. Understanding and applying boundaries will be explored and addressed for obtaining and maintaining a balanced life. Patients will be encouraged to explore ways to assertively make their unbalanced needs known to significant others in their lives, using other group members and facilitator for support and feedback.     Therapeutic Goals:  1.      Patient will identify two or more emotions or situations they have that consume much of in their lives.  2.      Patient will identify signs/triggers that life has become out of balance:  3.      Patient will identify two ways to set boundaries in order to achieve balance in their lives:  4.      Patient will demonstrate ability to communicate their needs through discussion and/or role plays     Summary of Patient Progress: Pt arrived to group with about 10 minutes left in group. Pt participated in the mindfulness meditation exercise that occurred at the end of group.   Therapeutic Modalities:   Cognitive Behavioral Therapy  Solution-Focused Therapy  Assertiveness Training     Markes Shatswell [place] MSW, LCSW-A  08/21/2020 2:15 PM

## 2020-08-22 LAB — HEMOGLOBIN A1C
Hgb A1c MFr Bld: 5.4 % (ref 4.8–5.6)
Mean Plasma Glucose: 108 mg/dL

## 2020-08-22 MED ORDER — QUETIAPINE FUMARATE 200 MG PO TABS
600.0000 mg | ORAL_TABLET | Freq: Every day | ORAL | Status: DC
  Administered 2020-08-22 – 2020-08-25 (×4): 600 mg via ORAL
  Filled 2020-08-22 (×4): qty 3

## 2020-08-22 NOTE — Progress Notes (Signed)
Patient alert and oriented x 3 with periods of confusion to situation, affect is blunted, thoughts are disorganized and incoherent at times,  Speech is tangential with delayed response noted during interaction with him. Patient also  appears responding to internal stimuli and suspicious of staff, although he denies SI/HI/AVH. Patient was noted in the dayroom seated by himself not interacting with peers and staff. Patient was offered emotional support and encouragement, he was complaint with scheduled medication regimen, no distress noted, 15 minutes safety checks maintained will continue to monitor.

## 2020-08-22 NOTE — Tx Team (Addendum)
Interdisciplinary Treatment and Diagnostic Plan Update  08/22/2020 Time of Session: 09:00 Timothy [name] MRN: [id number]  Principal Diagnosis: Schizoaffective disorder, depressive type (HCC)  Secondary Diagnoses: Principal Problem:   Schizoaffective disorder, depressive type (HCC) Active Problems:   Cannabis use disorder, moderate, dependence (HCC)   Tobacco use disorder   Current Medications:  Current Facility-Administered Medications  Medication Dose Route Frequency Provider Last Rate Last Admin  . acetaminophen (TYLENOL) tablet 650 mg  650 mg Oral Q6H PRN Clapacs, Jackquline Denmark, MD   650 mg at 08/21/20 0758  . alum & mag hydroxide-simeth (MAALOX/MYLANTA) 200-200-20 MG/5ML suspension 30 mL  30 mL Oral Q4H PRN Clapacs, Jackquline Denmark, MD   30 mL at 08/21/20 0759  . bismuth subsalicylate (PEPTO BISMOL) chewable tablet 524 mg  524 mg Oral Q4H PRN Jesse Sans, MD      . divalproex (DEPAKOTE) DR tablet 500 mg  500 mg Oral Q12H Jesse Sans, MD   500 mg at 08/22/20 0851  . magnesium hydroxide (MILK OF MAGNESIA) suspension 30 mL  30 mL Oral Daily PRN Clapacs, John T, MD      . QUEtiapine (SEROQUEL) tablet 400 mg  400 mg Oral QHS Jesse Sans, MD   400 mg at 08/21/20 2129  . traZODone (DESYREL) tablet 50 mg  50 mg Oral QHS PRN Jesse Sans, MD   50 mg at 08/21/20 2129   PTA Medications: Medications Prior to Admission  Medication Sig Dispense Refill Last Dose  . buPROPion (WELLBUTRIN XL) 150 MG 24 hr tablet Take 1 tablet (150 mg total) by mouth daily. For depression (Patient not taking: No sig reported) 30 tablet 1   . divalproex (DEPAKOTE) 500 MG DR tablet Take 1 tablet (500 mg total) by mouth 2 (two) times daily. For mood stabilization (Patient not taking: No sig reported) 60 tablet 1   . hydrOXYzine (ATARAX/VISTARIL) 25 MG tablet Take 1 tablet (25 mg total) by mouth 3 (three) times daily as needed for anxiety. (Patient not taking: No sig reported) 75 tablet 1   . QUEtiapine (SEROQUEL)  300 MG tablet Take 2 tablets (600 mg total) by mouth at bedtime. For mood control (Patient not taking: No sig reported) 60 tablet 1   . traZODone (DESYREL) 50 MG tablet Take 1 tablet (50 mg total) by mouth at bedtime as needed for sleep. (Patient not taking: No sig reported) 30 tablet 1     Patient Stressors: Medication change or noncompliance Substance abuse Other: grandparent's health  Patient Strengths: Average or above average intelligence Communication skills Financial means General fund of knowledge Motivation for treatment/growth Physical Health Supportive family/friends Work skills  Treatment Modalities: Medication Management, Group therapy, Case management,  1 to 1 session with clinician, Psychoeducation, Recreational therapy.   Physician Treatment Plan for Primary Diagnosis: Schizoaffective disorder, depressive type (HCC) Long Term Goal(s): Improvement in symptoms so as ready for discharge Improvement in symptoms so as ready for discharge   Short Term Goals: Ability to identify changes in lifestyle to reduce recurrence of condition will improve Ability to verbalize feelings will improve Ability to disclose and discuss suicidal ideas Ability to demonstrate self-control will improve Ability to identify and develop effective coping behaviors will improve Ability to maintain clinical measurements within normal limits will improve Compliance with prescribed medications will improve Ability to identify triggers associated with substance abuse/mental health issues will improve Ability to identify changes in lifestyle to reduce recurrence of condition will improve Ability to verbalize feelings will improve  Ability to disclose and discuss suicidal ideas Ability to demonstrate self-control will improve Ability to identify and develop effective coping behaviors will improve Ability to maintain clinical measurements within normal limits will improve Compliance with prescribed  medications will improve Ability to identify triggers associated with substance abuse/mental health issues will improve  Medication Management: Evaluate patient's response, side effects, and tolerance of medication regimen.  Therapeutic Interventions: 1 to 1 sessions, Unit Group sessions and Medication administration.  Evaluation of Outcomes: Progressing  Physician Treatment Plan for Secondary Diagnosis: Principal Problem:   Schizoaffective disorder, depressive type (HCC) Active Problems:   Cannabis use disorder, moderate, dependence (HCC)   Tobacco use disorder  Long Term Goal(s): Improvement in symptoms so as ready for discharge Improvement in symptoms so as ready for discharge   Short Term Goals: Ability to identify changes in lifestyle to reduce recurrence of condition will improve Ability to verbalize feelings will improve Ability to disclose and discuss suicidal ideas Ability to demonstrate self-control will improve Ability to identify and develop effective coping behaviors will improve Ability to maintain clinical measurements within normal limits will improve Compliance with prescribed medications will improve Ability to identify triggers associated with substance abuse/mental health issues will improve Ability to identify changes in lifestyle to reduce recurrence of condition will improve Ability to verbalize feelings will improve Ability to disclose and discuss suicidal ideas Ability to demonstrate self-control will improve Ability to identify and develop effective coping behaviors will improve Ability to maintain clinical measurements within normal limits will improve Compliance with prescribed medications will improve Ability to identify triggers associated with substance abuse/mental health issues will improve     Medication Management: Evaluate patient's response, side effects, and tolerance of medication regimen.  Therapeutic Interventions: 1 to 1 sessions, Unit  Group sessions and Medication administration.  Evaluation of Outcomes: Progressing   RN Treatment Plan for Primary Diagnosis: Schizoaffective disorder, depressive type (HCC) Long Term Goal(s): Knowledge of disease and therapeutic regimen to maintain health will improve  Short Term Goals: Ability to remain free from injury will improve, Ability to demonstrate self-control, Ability to participate in decision making will improve, Ability to verbalize feelings will improve, Ability to disclose and discuss suicidal ideas, Ability to identify and develop effective coping behaviors will improve and Compliance with prescribed medications will improve  Medication Management: RN will administer medications as ordered by provider, will assess and evaluate patient's response and provide education to patient for prescribed medication. RN will report any adverse and/or side effects to prescribing provider.  Therapeutic Interventions: 1 on 1 counseling sessions, Psychoeducation, Medication administration, Evaluate responses to treatment, Monitor vital signs and CBGs as ordered, Perform/monitor CIWA, COWS, AIMS and Fall Risk screenings as ordered, Perform wound care treatments as ordered.  Evaluation of Outcomes: Progressing   LCSW Treatment Plan for Primary Diagnosis: Schizoaffective disorder, depressive type (HCC) Long Term Goal(s): Safe transition to appropriate next level of care at discharge, Engage patient in therapeutic group addressing interpersonal concerns.  Short Term Goals: Engage patient in aftercare planning with referrals and resources, Increase social support, Increase ability to appropriately verbalize feelings, Increase emotional regulation, Facilitate acceptance of mental health diagnosis and concerns, Facilitate patient progression through stages of change regarding substance use diagnoses and concerns, Identify triggers associated with mental health/substance abuse issues and Increase skills  for wellness and recovery  Therapeutic Interventions: Assess for all discharge needs, 1 to 1 time with Social worker, Explore available resources and support systems, Assess for adequacy in community support network, Educate family  and significant other(s) on suicide prevention, Complete Psychosocial Assessment, Interpersonal group therapy.  Evaluation of Outcomes: Progressing   Progress in Treatment: Attending groups: Yes. Participating in groups: Yes. Taking medication as prescribed: Yes. Toleration medication: Yes. Family/Significant other contact made: Yes, individual(s) contacted:  Scarlette Slice, mother. Patient understands diagnosis: Yes. Discussing patient identified problems/goals with staff: Yes. Medical problems stabilized or resolved: Yes. Denies suicidal/homicidal ideation: No. Issues/concerns per patient self-inventory: No. Other: None.  New problem(s) identified: No, Describe:  none.  New Short Term/Long Term Goal(s): detox, elimination of symptoms of psychosis, medication management for mood stabilization; elimination of SI thoughts; development of comprehensive mental wellness/sobriety plan.  Patient Goals: "Really need treatment." He states that marijuana is the main issue and that he needs to get back on his medications.    Discharge Plan or Barriers: CSW will assist patient with development of an appropriate discharge/aftercare plan.  Reason for Continuation of Hospitalization: Depression Hallucinations Medication stabilization Suicidal ideation  Estimated Length of Stay: 1-7 days   Recreational Therapy: Patient Stressors: N/A Patient Goal: Patient will engage in groups without prompting or encouragement from LRT x3 group sessions within 5 recreation therapy group sessions.  Attendees: Patient: Timothy [name] 08/22/2020 9:23 AM  Physician: Les Pou, MD 08/22/2020 9:23 AM  Nursing: Cecille Amsterdam, RN 08/22/2020 9:23 AM  RN Care Manager: 08/22/2020  9:23 AM  Social Worker: Vilma Meckel. Algis Greenhouse, MSW, [place], LCAS 08/22/2020 9:23 AM  Recreational Therapist: Hilbert Bible, LRT  08/22/2020 9:23 AM  Other:  08/22/2020 9:23 AM  Other:  08/22/2020 9:23 AM  Other: 08/22/2020 9:23 AM    Scribe for Treatment Team: [name], LCSW 08/22/2020 9:23 AM

## 2020-08-22 NOTE — [facility] Group Notes (Addendum)
LCSW Group Therapy Note     08/22/2020 2:39 PM     Type of Therapy and Topic:  Group Therapy:  Feelings around Relapse and Recovery     Participation Level:  Active     Description of Group:    Patients in this group will discuss emotions they experience before and after a relapse. They will process how experiencing these feelings, or avoidance of experiencing them, relates to having a relapse. Facilitator will guide patients to explore emotions they have related to recovery. Patients will be encouraged to process which emotions are more powerful. They will be guided to discuss the emotional reaction significant others in their lives may have to their relapse or recovery. Patients will be assisted in exploring ways to respond to the emotions of others without this contributing to a relapse.      Therapeutic Goals:  1.    Patient will identify two or more emotions that lead to a relapse for them  2.    Patient will identify two emotions that result when they relapse  3.    Patient will identify two emotions related to recovery  4.    Patient will demonstrate ability to communicate their needs through discussion and/or role plays        Summary of Patient Progress:  Pt identified that his family was a significant factor in helping him cutback on his substance use. CSW discussed how the value of being a role model for his nieces and nephews is beneficial to his recovery process. Pt stated that reaching out to his friends and connecting with them was also important. Pt stated that the recovery process can bring about feelings of loneliness.            Therapeutic Modalities:   Cognitive Behavioral Therapy  Solution-Focused Therapy  Assertiveness Training  Relapse Prevention Therapy       Christianne Zacher [place], MSW, LCSW-A  08/22/2020 2:39 PM

## 2020-08-22 NOTE — Progress Notes (Signed)
Recreation Therapy Notes   Date: 08/22/2020  Time: 9:30 am   Location: Court yard   Behavioral response: Appropriate  Intervention Topic: Leisure   Discussion/Intervention:  Group content today was focused on leisure. The group defined what leisure is and some positive leisure activities they participate in. Individuals identified the difference between good and bad leisure. Participants expressed how they feel after participating in the leisure of their choice. The group discussed how they go about picking a leisure activity and if others are involved in their leisure activities. The patient stated how many leisure activities they have to choose from and reasons why it is important to have leisure time. Individuals participated in the intervention "Exploration of Leisure" where they had a chance to identify new leisure activities as well as benefits of leisure. Clinical Observations/Feedback: Patient came to group and was focused on what peers and staff had to say about leisure. Individual was social with peers and staff while participating in the intervention. Tyjanae Bartek LRT/CTRS         Letcher Schweikert 08/22/2020 10:53 AM

## 2020-08-22 NOTE — [facility] Counselor (Signed)
CSW reached out to APS worker Levora Angel [phone]) to follow up to clarify his request for information. HIPPA complaint voicemail left with contact information for follow up.  Vilma Meckel. Algis Greenhouse, MSW, LCSW, LCAS 08/22/2020 11:11 AM

## 2020-08-22 NOTE — Plan of Care (Signed)
Patient has been more visible in the milieu. Remains guarded with some improvements. Attending groups and taking medications. Appetite improved. Denies thoughts of self harm. Staff continue to provide support and encouragements. Safety precautions maintained.

## 2020-08-22 NOTE — Progress Notes (Signed)
[facility] MD Progress Note  08/22/2020 1:49 PM Timothy [name]  MRN:  [id number]   CC: "I need to stop smoking."  Subjective:  25 year old male with schizoaffective disorder who presented for bizarre behavior and psychosis. No acute events overnight, medication compliant, attending to ADLs.   Patient seen during treatment team and again one-on-one. He notes his goals are to get off mariajuana and on right treatment. We did some motivational interviewing today. He noted his main motivations for quitting are to save money, have a better relationship with his mother, and have a larger dating pool. Discussed SMART goals, NA/AA meetings, outpatient treatment options. We also discussed placing money he would typically use for drugs in a jar to buy something special such as a trip. He notes that his main coping skills were drawing and listening to music.   Principal Problem: Schizoaffective disorder, depressive type (HCC) Diagnosis: Principal Problem:   Schizoaffective disorder, depressive type (HCC) Active Problems:   Cannabis use disorder, moderate, dependence (HCC)   Tobacco use disorder  Total Time spent with patient: 45 minutes  Past Psychiatric History: See H&P  Past Medical History:  Past Medical History:  Diagnosis Date  . Cannabis abuse   . Paranoid schizophrenia (HCC)   . Schizoaffective disorder, depressive type (HCC) 09/17/2014   History reviewed. No pertinent surgical history. Family History:  Family History  Problem Relation Age of Onset  . Mental illness Brother   . Drug abuse Brother   . Mental illness Cousin   . Suicidality Cousin   . Diabetes Maternal Grandmother    Family Psychiatric  History: See H&P Social History:  Social History   Substance and Sexual Activity  Alcohol Use Yes  . Alcohol/week: 2.0 standard drinks  . Types: 2 Cans of beer per week     Social History   Substance and Sexual Activity  Drug Use Yes  . Types: Marijuana, MDMA (Ecstacy)    Social  History   Socioeconomic History  . Marital status: Single    Spouse name: Not on file  . Number of children: Not on file  . Years of education: Not on file  . Highest education level: Not on file  Occupational History  . Not on file  Tobacco Use  . Smoking status: Current Some Day Smoker    Packs/day: 0.25  . Smokeless tobacco: Never Used  Vaping Use  . Vaping Use: Never used  Substance and Sexual Activity  . Alcohol use: Yes    Alcohol/week: 2.0 standard drinks    Types: 2 Cans of beer per week  . Drug use: Yes    Types: Marijuana, MDMA (Ecstacy)  . Sexual activity: Yes  Other Topics Concern  . Not on file  Social History Narrative  . Not on file   Social Determinants of Health   Financial Resource Strain: Low Risk   . Difficulty of Paying Living Expenses: Not hard at all  Food Insecurity: No Food Insecurity  . Worried About Programme researcher, broadcasting/film/video in the Last Year: Never true  . Ran Out of Food in the Last Year: Never true  Transportation Needs: No Transportation Needs  . Lack of Transportation (Medical): No  . Lack of Transportation (Non-Medical): No  Physical Activity: Inactive  . Days of Exercise per Week: 0 days  . Minutes of Exercise per Session: 0 min  Stress: Stress Concern Present  . Feeling of Stress : Rather much  Social Connections: Moderately Isolated  . Frequency of Communication with  Friends and Family: Once a week  . Frequency of Social Gatherings with Friends and Family: Once a week  . Attends Religious Services: More than 4 times per year  . Active Member of Clubs or Organizations: Yes  . Attends BankerClub or Organization Meetings: More than 4 times per year  . Marital Status: Never married   Additional Social History:                         Sleep: Fair  Appetite:  Fair  Current Medications: Current Facility-Administered Medications  Medication Dose Route Frequency Provider Last Rate Last Admin  . acetaminophen (TYLENOL) tablet 650 mg   650 mg Oral Q6H PRN Clapacs, Jackquline DenmarkJohn T, MD   650 mg at 08/21/20 0758  . alum & mag hydroxide-simeth (MAALOX/MYLANTA) 200-200-20 MG/5ML suspension 30 mL  30 mL Oral Q4H PRN Clapacs, Jackquline DenmarkJohn T, MD   30 mL at 08/21/20 0759  . bismuth subsalicylate (PEPTO BISMOL) chewable tablet 524 mg  524 mg Oral Q4H PRN Jesse SansFreeman, Nissi Doffing M, MD      . divalproex (DEPAKOTE) DR tablet 500 mg  500 mg Oral Q12H Jesse SansFreeman, Weda Baumgarner M, MD   500 mg at 08/22/20 0851  . magnesium hydroxide (MILK OF MAGNESIA) suspension 30 mL  30 mL Oral Daily PRN Clapacs, John T, MD      . QUEtiapine (SEROQUEL) tablet 600 mg  600 mg Oral QHS Jesse SansFreeman, Saad Buhl M, MD      . traZODone (DESYREL) tablet 50 mg  50 mg Oral QHS PRN Jesse SansFreeman, Sunaina Ferrando M, MD   50 mg at 08/21/20 2129    Lab Results:  Results for orders placed or performed during the hospital encounter of 08/20/20 (from the past 48 hour(s))  Hemoglobin A1c     Status: None   Collection Time: 08/21/20  8:03 AM  Result Value Ref Range   Hgb A1c MFr Bld 5.4 4.8 - 5.6 %    Comment: (NOTE)         Prediabetes: 5.7 - 6.4         Diabetes: >6.4         Glycemic control for adults with diabetes: <7.0    Mean Plasma Glucose 108 mg/dL    Comment: (NOTE) Performed At: [facility]  [address] [place], [place] [id number] Jolene SchimkeNagendra Sanjai MD VQ:[id number]:[phone]   Lipid panel     Status: Abnormal   Collection Time: 08/21/20  8:03 AM  Result Value Ref Range   Cholesterol 167 0 - 200 mg/dL   Triglycerides 76 <433<150 mg/dL   HDL 45 >29>40 mg/dL   Total CHOL/HDL Ratio 3.7 RATIO   VLDL 15 0 - 40 mg/dL   LDL Cholesterol 518107 (H) 0 - 99 mg/dL    Comment:        Total Cholesterol/HDL:CHD Risk Coronary Heart Disease Risk Table                     Men   Women  1/2 Average Risk   3.4   3.3  Average Risk       5.0   4.4  2 X Average Risk   9.6   7.1  3 X Average Risk  23.4   11.0        Use the calculated Patient Ratio above and the CHD Risk Table to determine the patient's CHD Risk.        ATP III  CLASSIFICATION (LDL):  <100  mg/dL   Optimal  951-884  mg/dL   Near or Above                    Optimal  130-159  mg/dL   Borderline  166-063  mg/dL   High  >016     mg/dL   Very High Performed at [facility], [address] [place], [place] [address]   Lipase, blood     Status: None   Collection Time: 08/21/20  8:03 AM  Result Value Ref Range   Lipase 34 11 - 51 U/L    Comment: Performed at [facility], [address] [place], [place] [address]  Amylase     Status: Abnormal   Collection Time: 08/21/20  8:03 AM  Result Value Ref Range   Amylase 121 (H) 28 - 100 U/L    Comment: Performed at [facility], [address] [place], [place] [address]  Urinalysis, Routine w reflex microscopic Urine, Random     Status: Abnormal   Collection Time: 08/21/20  1:48 PM  Result Value Ref Range   Color, Urine YELLOW (A) YELLOW   APPearance CLEAR (A) CLEAR   Specific Gravity, Urine 1.016 1.005 - 1.030   pH 8.0 5.0 - 8.0   Glucose, UA NEGATIVE NEGATIVE mg/dL   Hgb urine dipstick NEGATIVE NEGATIVE   Bilirubin Urine NEGATIVE NEGATIVE   Ketones, ur NEGATIVE NEGATIVE mg/dL   Protein, ur NEGATIVE NEGATIVE mg/dL   Nitrite NEGATIVE NEGATIVE   Leukocytes,Ua NEGATIVE NEGATIVE    Comment: Performed at [facility], [address] [place], [place] [address]    Blood Alcohol level:  Lab Results  Component Value Date   [facility] <10 08/19/2020   ETH <10 04/10/2020    Metabolic Disorder Labs: Lab Results  Component Value Date   HGBA1C 5.4 08/21/2020   MPG 108 08/21/2020   MPG 111 08/19/2020   Lab Results  Component Value Date   PROLACTIN 33.2 (H) 08/15/2016   Lab Results  Component Value Date   CHOL 167 08/21/2020   TRIG 76 08/21/2020   HDL 45 08/21/2020   CHOLHDL 3.7 08/21/2020   VLDL 15 08/21/2020   LDLCALC 107 (H) 08/21/2020   LDLCALC 108 (H) 11/11/2019    Physical Findings: AIMS:  , ,  ,  ,    CIWA:    COWS:      Musculoskeletal: Strength & Muscle Tone: within normal limits Gait & Station: normal Patient leans: N/A  Psychiatric Specialty Exam: Physical Exam  Review of Systems  Blood pressure 116/73, pulse 74, temperature 98.3 F (36.8 C), temperature source Oral, resp. rate 17, height 6\' 4"  (1.93 m), weight 94.8 kg, SpO2 100 %.Body mass index is 25.44 kg/m.  General Appearance: Fairly Groomed  Eye Contact:  Minimal  Speech:  Clear and Coherent  Volume:  Normal  Mood:  Depressed  Affect:  Congruent  Thought Process:  Coherent  Orientation:  Full (Time, Place, and Person)  Thought Content:  Hallucinations: Auditory Visual  Suicidal Thoughts:  Yes.  without intent/plan  Homicidal Thoughts:  No  Memory:  Immediate;   Fair Recent;   Fair Remote;   Fair  Judgement:  Intact  Insight:  Shallow  Psychomotor Activity:  Normal  Concentration:  Concentration: Fair  Recall:  of Knowledge:  Fair  Language:  Fair  Akathisia:  Negative  Handed:  Right  AIMS (if indicated):     Assets:  Communication Skills  Desire for Improvement Housing Physical Health Resilience Social Support  ADL's:  Intact  Cognition:  WNL  Sleep:  Number of Hours: 7.75     Treatment Plan Summary: Daily contact with patient to assess and evaluate symptoms and progress in treatment and Medication management  1) Schizoaffective Affective Disorder, Depressed type- established problem, unstable - Presenting with AH/VH and suicidal ideations  - Restart Depakote 500 BID  - Restart Seroquel 400 mg QHS, and titrate back to pervious dose of 600 mg as tolerated  2) Cannabis Use Disorder- established problem - 30 minutes of psychotherapy/motivational interviewing performed with patient to develop SMART goal for cannabis smoking cessation  3) Abdominal pain- resolved - Patient no longer endorsing abdominal pain today. Lipase within normal limits, UA within normal limits - Pepto bismol for symptomatic  relief, and monitor   Jesse Sans, MD 08/22/2020, 1:49 PM

## 2020-08-23 NOTE — Plan of Care (Signed)
Active in the milieu, calm and cooperative. Alert and oriented and denying thoughts of self-harm. Has no major sign of distress.

## 2020-08-23 NOTE — B[facility] Counselor (Signed)
CSW provided patient with directions on how to apply for a job.

## 2020-08-23 NOTE — Progress Notes (Signed)
Patient alert and oriented x 4, affect is blunted, thoughts are organized and coherent, forwards very little, with delayed response noted during interaction with him. Patient appears responding to internal stimuli but noted interacting appropriately with peers and staff. Patient currently denies denies SI/HI/AVH. Marland Kitchen Patient was offered emotional support and encouragement, he was complaint with scheduled medication regimen, no distress noted, 15 minutes safety checks maintained will continue to monitor.

## 2020-08-23 NOTE — [facility] Group Notes (Signed)
  [facility] LCSW Group Therapy Note  Date/Time:  08/23/2020 1:44 PM- 2:57 PM   Type of Therapy and Topic:  Group Therapy:  Feelings About Hospitalization  Participation Level:  None   Description of Group This process group involved patients discussing their feelings related to being hospitalized, as well as the benefits they see to being in the hospital.  These feelings and benefits were itemized.  The group then brainstormed specific ways in which they could seek those same benefits when they discharge and return home.  Therapeutic Goals 1. Patient will identify and describe positive and negative feelings related to hospitalization 2. Patient will verbalize benefits of hospitalization to themselves personally 3. Patients will brainstorm together ways they can obtain similar benefits in the outpatient setting, identify barriers to wellness and possible solutions  Summary of Patient Progress:  Patient came into group but did not participate.   Therapeutic Modalities Cognitive Behavioral Therapy Motivational Interviewing    Susa Simmonds, Theresia Majors 08/23/2020  4:37 PM

## 2020-08-23 NOTE — Progress Notes (Addendum)
[facility] MD Progress Note  08/23/2020 12:53 PM Timothy [name]  MRN:  [id number]   CC: "I'm fine."  Subjective:    Patient is a 25 year old male who is being seen for the first time today. He was seen by Dr. Neale Burly on August 21, 2020 for initial psychiatric evaluation. He was brought in due to exacerbations of psychosis. Patient is calm and cooperative, soft soft-spoken  Tells me that he's feeling "little" better. Says that his auditory hallucinations were command and negativistic, but no longer. Auditory halluinations are more prevalent and says that they are moderate in intensity as are his visual. Paranoia is modeate. At his best, says that they do not go away completely but become manageable and mild. Denies suicidal ideations. He slept well last night. Eating well. No new medical problems. Depression anxiety arelow.  Principal Problem: Schizoaffective disorder, depressive type (HCC) Diagnosis: Principal Problem:   Schizoaffective disorder, depressive type (HCC) Active Problems:   Cannabis use disorder, moderate, dependence (HCC)   Tobacco use disorder  Total Time spent with patient: 45 minutes  Past Psychiatric History: See H&P  Past Medical History:  Past Medical History:  Diagnosis Date  . Cannabis abuse   . Paranoid schizophrenia (HCC)   . Schizoaffective disorder, depressive type (HCC) 09/17/2014   History reviewed. No pertinent surgical history. Family History:  Family History  Problem Relation Age of Onset  . Mental illness Brother   . Drug abuse Brother   . Mental illness Cousin   . Suicidality Cousin   . Diabetes Maternal Grandmother    Family Psychiatric  History: See H&P Social History:  Social History   Substance and Sexual Activity  Alcohol Use Yes  . Alcohol/week: 2.0 standard drinks  . Types: 2 Cans of beer per week     Social History   Substance and Sexual Activity  Drug Use Yes  . Types: Marijuana, MDMA (Ecstacy)    Social History    Socioeconomic History  . Marital status: Single    Spouse name: Not on file  . Number of children: Not on file  . Years of education: Not on file  . Highest education level: Not on file  Occupational History  . Not on file  Tobacco Use  . Smoking status: Current Some Day Smoker    Packs/day: 0.25  . Smokeless tobacco: Never Used  Vaping Use  . Vaping Use: Never used  Substance and Sexual Activity  . Alcohol use: Yes    Alcohol/week: 2.0 standard drinks    Types: 2 Cans of beer per week  . Drug use: Yes    Types: Marijuana, MDMA (Ecstacy)  . Sexual activity: Yes  Other Topics Concern  . Not on file  Social History Narrative  . Not on file   Social Determinants of Health   Financial Resource Strain: Low Risk   . Difficulty of Paying Living Expenses: Not hard at all  Food Insecurity: No Food Insecurity  . Worried About Programme researcher, broadcasting/film/video in the Last Year: Never true  . Ran Out of Food in the Last Year: Never true  Transportation Needs: No Transportation Needs  . Lack of Transportation (Medical): No  . Lack of Transportation (Non-Medical): No  Physical Activity: Inactive  . Days of Exercise per Week: 0 days  . Minutes of Exercise per Session: 0 min  Stress: Stress Concern Present  . Feeling of Stress : Rather much  Social Connections: Moderately Isolated  . Frequency of Communication with Friends and  Family: Once a week  . Frequency of Social Gatherings with Friends and Family: Once a week  . Attends Religious Services: More than 4 times per year  . Active Member of Clubs or Organizations: Yes  . Attends Banker Meetings: More than 4 times per year  . Marital Status: Never married   Additional Social History:                         Sleep: Fair  Appetite:  Fair  Current Medications: Current Facility-Administered Medications  Medication Dose Route Frequency Provider Last Rate Last Admin  . acetaminophen (TYLENOL) tablet 650 mg  650 mg  Oral Q6H PRN Clapacs, Jackquline Denmark, MD   650 mg at 08/23/20 0839  . alum & mag hydroxide-simeth (MAALOX/MYLANTA) 200-200-20 MG/5ML suspension 30 mL  30 mL Oral Q4H PRN Clapacs, Jackquline Denmark, MD   30 mL at 08/21/20 0759  . bismuth subsalicylate (PEPTO BISMOL) chewable tablet 524 mg  524 mg Oral Q4H PRN Jesse Sans, MD      . divalproex (DEPAKOTE) DR tablet 500 mg  500 mg Oral Q12H Jesse Sans, MD   500 mg at 08/23/20 0839  . magnesium hydroxide (MILK OF MAGNESIA) suspension 30 mL  30 mL Oral Daily PRN Clapacs, John T, MD      . QUEtiapine (SEROQUEL) tablet 600 mg  600 mg Oral QHS Jesse Sans, MD   600 mg at 08/22/20 2104  . traZODone (DESYREL) tablet 50 mg  50 mg Oral QHS PRN Jesse Sans, MD   50 mg at 08/21/20 2129    Lab Results:  Results for orders placed or performed during the hospital encounter of 08/20/20 (from the past 48 hour(s))  Urinalysis, Routine w reflex microscopic Urine, Random     Status: Abnormal   Collection Time: 08/21/20  1:48 PM  Result Value Ref Range   Color, Urine YELLOW (A) YELLOW   APPearance CLEAR (A) CLEAR   Specific Gravity, Urine 1.016 1.005 - 1.030   pH 8.0 5.0 - 8.0   Glucose, UA NEGATIVE NEGATIVE mg/dL   Hgb urine dipstick NEGATIVE NEGATIVE   Bilirubin Urine NEGATIVE NEGATIVE   Ketones, ur NEGATIVE NEGATIVE mg/dL   Protein, ur NEGATIVE NEGATIVE mg/dL   Nitrite NEGATIVE NEGATIVE   Leukocytes,Ua NEGATIVE NEGATIVE    Comment: Performed at [facility] [address] [place], [place] [address]    Blood Alcohol level:  Lab Results  Component Value Date   [facility] <10 08/19/2020   ETH <10 04/10/2020    Metabolic Disorder Labs: Lab Results  Component Value Date   HGBA1C 5.4 08/21/2020   MPG 108 08/21/2020   MPG 111 08/19/2020   Lab Results  Component Value Date   PROLACTIN 33.2 (H) 08/15/2016   Lab Results  Component Value Date   CHOL 167 08/21/2020   TRIG 76 08/21/2020   HDL 45 08/21/2020   CHOLHDL 3.7 08/21/2020   VLDL 15  08/21/2020   LDLCALC 107 (H) 08/21/2020   LDLCALC 108 (H) 11/11/2019    Physical Findings: AIMS:  , ,  ,  ,    CIWA:    COWS:     Musculoskeletal: Strength & Muscle Tone: within normal limits Gait & Station: normal Patient leans: N/A  Psychiatric Specialty Exam: Physical Exam  Review of Systems  Blood pressure 109/90, pulse 83, temperature 98.1 F (36.7 C), temperature source Oral, resp. rate 18, height 6\' 4"  (1.93  m), weight 94.8 kg, SpO2 100 %.Body mass index is 25.44 kg/m.  General Appearance: Fairly Groomed  Eye Contact:  Minimal  Speech:  Clear and Coherent  Volume:  Normal  Mood:  Fine  Affect:  Congruent  Thought Process:  Coherent  Orientation:  Full (Time, Place, and Person)  Thought Content:  Hallucinations: Auditory Visual  Suicidal Thoughts:  Yes.  without intent/plan  Homicidal Thoughts:  No  Memory:  Immediate;   Fair Recent;   Fair Remote;   Fair  Judgement:  Intact  Insight:  Shallow  Psychomotor Activity:  Normal  Concentration:  Concentration: Fair  Recall:  [employer] of Knowledge:  Fair  Language:  Fair  Akathisia:  Negative  Handed:  Right  AIMS (if indicated):     Assets:  Communication Skills Desire for Improvement Housing Physical Health Resilience Social Support  ADL's:  Intact  Cognition:  WNL  Sleep:  Number of Hours: 6     Treatment Plan Summary: Daily contact with patient to assess and evaluate symptoms and progress in treatment and Medication management  1) Schizoaffective Affective Disorder, Depressed type- established problem, unstable - Presenting with AH/VH and suicidal ideations  - Restart Depakote 500 BID  - Restart Seroquel 400 mg QHS, and titrate back to pervious dose of 600 mg as tolerated  2) Cannabis Use Disorder- established problem - 30 minutes of psychotherapy/motivational interviewing performed with patient to develop SMART goal for cannabis smoking cessation  3) Abdominal pain- resolved - Patient no  longer endorsing abdominal pain today. Lipase within normal limits, UA within normal limits - Pepto bismol for symptomatic relief, and monitor   2/19 Depakote level Monday morning Seroquel recently increased on February 18- 600 mg  CPT: 20100  Reggie Pile, MD 08/23/2020, 12:53 PM

## 2020-08-24 NOTE — [facility] Group Notes (Signed)
[facility] Group Notes:  (Nursing/MHT/Case Management/Adjunct)  Date:  08/24/2020  Time:  9:08 PM  Type of Therapy:  Group Therapy  Participation Level:  Active  Participation Quality:  Appropriate  Affect:  Appropriate  Cognitive:  Alert  Insight:  Good  Engagement in Group:  Engaged and had a good day.  Modes of Intervention:  Support  Summary of Progress/Problems:  Timothy [name] 08/24/2020, 9:08 PM

## 2020-08-24 NOTE — [facility] Group Notes (Signed)
[facility] Group Notes: (Clinical Social Work)   08/24/2020      Type of Therapy:  Group Therapy   Participation Level:  Did Not Attend despite MHT prompting   Terra Aveni N Camira Geidel, LCSW  08/24/2020 2:43 PM   

## 2020-08-24 NOTE — Plan of Care (Signed)
  Problem: Education: Goal: Emotional status will improve Outcome: Progressing Goal: Mental status will improve Outcome: Progressing Goal: Verbalization of understanding the information provided will improve Outcome: Not Progressing   Problem: Activity: Goal: Sleeping patterns will improve Outcome: Progressing   Problem: Coping: Goal: Ability to verbalize frustrations and anger appropriately will improve Outcome: Progressing   Problem: Health Behavior/Discharge Planning: Goal: Compliance with treatment plan for underlying cause of condition will improve Outcome: Progressing   Problem: Safety: Goal: Periods of time without injury will increase Outcome: Progressing   Problem: Education: Goal: Knowledge of the prescribed therapeutic regimen will improve Outcome: Progressing   Problem: Health Behavior/Discharge Planning: Goal: Compliance with prescribed medication regimen will improve Outcome: Progressing   Problem: Safety: Goal: Ability to redirect hostility and anger into socially appropriate behaviors will improve Outcome: Progressing Goal: Ability to remain free from injury will improve Outcome: Progressing   Problem: Safety: Goal: Ability to redirect hostility and anger into socially appropriate behaviors will improve Outcome: Progressing Goal: Ability to remain free from injury will improve Outcome: Progressing

## 2020-08-24 NOTE — Progress Notes (Signed)
[facility] MD Progress Note  08/24/2020 10:58 AM Timothy [name]  MRN:  [id number]   CC: "I'm alright."  Subjective:    Patient is a 25 year old male who is being seen for the first time today. He was seen by Dr. Neale Burly on August 21, 2020 for initial psychiatric evaluation. He was brought in due to exacerbations of psychosis.   08/24/20 Patient reports feeling about the same as yesterday. No new events that he can pinpoint. He did sleep well throughout the night. He has not had any hostility, volatility, or aggression.Minimal interactions with others. Talks about another male on the unit fallow him around, "it creeps me out. This patient has been seen to be intrusive with others. Mild auditory and visual hallucinations. Denies s/i, h/i. Denies excessive daytime sedation.   08/23/20 Patient is calm and cooperative, soft soft-spoken  Tells me that he's feeling "little" better. Says that his auditory hallucinations were command and negativistic, but no longer. Auditory halluinations are more prevalent and says that they are moderate in intensity as are his visual. Paranoia is modeate. At his best, says that they do not go away completely but become manageable and mild. Denies suicidal ideations. He slept well last night. Eating well. No new medical problems. Depression anxiety arelow.  Principal Problem: Schizoaffective disorder, depressive type (HCC) Diagnosis: Principal Problem:   Schizoaffective disorder, depressive type (HCC) Active Problems:   Cannabis use disorder, moderate, dependence (HCC)   Tobacco use disorder  Total Time spent with patient: 27 minutes.   Past Psychiatric History: See H&P  Past Medical History:  Past Medical History:  Diagnosis Date  . Cannabis abuse   . Paranoid schizophrenia (HCC)   . Schizoaffective disorder, depressive type (HCC) 09/17/2014   History reviewed. No pertinent surgical history. Family History:  Family History  Problem Relation Age of Onset  .  Mental illness Brother   . Drug abuse Brother   . Mental illness Cousin   . Suicidality Cousin   . Diabetes Maternal Grandmother    Family Psychiatric  History: See H&P Social History:  Social History   Substance and Sexual Activity  Alcohol Use Yes  . Alcohol/week: 2.0 standard drinks  . Types: 2 Cans of beer per week     Social History   Substance and Sexual Activity  Drug Use Yes  . Types: Marijuana, MDMA (Ecstacy)    Social History   Socioeconomic History  . Marital status: Single    Spouse name: Not on file  . Number of children: Not on file  . Years of education: Not on file  . Highest education level: Not on file  Occupational History  . Not on file  Tobacco Use  . Smoking status: Current Some Day Smoker    Packs/day: 0.25  . Smokeless tobacco: Never Used  Vaping Use  . Vaping Use: Never used  Substance and Sexual Activity  . Alcohol use: Yes    Alcohol/week: 2.0 standard drinks    Types: 2 Cans of beer per week  . Drug use: Yes    Types: Marijuana, MDMA (Ecstacy)  . Sexual activity: Yes  Other Topics Concern  . Not on file  Social History Narrative  . Not on file   Social Determinants of Health   Financial Resource Strain: Low Risk   . Difficulty of Paying Living Expenses: Not hard at all  Food Insecurity: No Food Insecurity  . Worried About Programme researcher, broadcasting/film/video in the Last Year: Never true  . Ran  Out of Food in the Last Year: Never true  Transportation Needs: No Transportation Needs  . Lack of Transportation (Medical): No  . Lack of Transportation (Non-Medical): No  Physical Activity: Inactive  . Days of Exercise per Week: 0 days  . Minutes of Exercise per Session: 0 min  Stress: Stress Concern Present  . Feeling of Stress : Rather much  Social Connections: Moderately Isolated  . Frequency of Communication with Friends and Family: Once a week  . Frequency of Social Gatherings with Friends and Family: Once a week  . Attends Religious Services:  More than 4 times per year  . Active Member of Clubs or Organizations: Yes  . Attends Banker Meetings: More than 4 times per year  . Marital Status: Never married   Additional Social History:                         Sleep: Fair  Appetite:  Fair  Current Medications: Current Facility-Administered Medications  Medication Dose Route Frequency Provider Last Rate Last Admin  . acetaminophen (TYLENOL) tablet 650 mg  650 mg Oral Q6H PRN Clapacs, Jackquline Denmark, MD   650 mg at 08/23/20 0839  . alum & mag hydroxide-simeth (MAALOX/MYLANTA) 200-200-20 MG/5ML suspension 30 mL  30 mL Oral Q4H PRN Clapacs, Jackquline Denmark, MD   30 mL at 08/21/20 0759  . bismuth subsalicylate (PEPTO BISMOL) chewable tablet 524 mg  524 mg Oral Q4H PRN Jesse Sans, MD      . divalproex (DEPAKOTE) DR tablet 500 mg  500 mg Oral Q12H Jesse Sans, MD   500 mg at 08/24/20 0805  . magnesium hydroxide (MILK OF MAGNESIA) suspension 30 mL  30 mL Oral Daily PRN Clapacs, John T, MD      . QUEtiapine (SEROQUEL) tablet 600 mg  600 mg Oral QHS Jesse Sans, MD   600 mg at 08/23/20 2032  . traZODone (DESYREL) tablet 50 mg  50 mg Oral QHS PRN Jesse Sans, MD   50 mg at 08/23/20 2032    Lab Results:  No results found for this or any previous visit (from the past 48 hour(s)).  Blood Alcohol level:  Lab Results  Component Value Date   ETH <10 08/19/2020   ETH <10 04/10/2020    Metabolic Disorder Labs: Lab Results  Component Value Date   HGBA1C 5.4 08/21/2020   MPG 108 08/21/2020   MPG 111 08/19/2020   Lab Results  Component Value Date   PROLACTIN 33.2 (H) 08/15/2016   Lab Results  Component Value Date   CHOL 167 08/21/2020   TRIG 76 08/21/2020   HDL 45 08/21/2020   CHOLHDL 3.7 08/21/2020   VLDL 15 08/21/2020   LDLCALC 107 (H) 08/21/2020   LDLCALC 108 (H) 11/11/2019    Physical Findings: AIMS:  , ,  ,  ,    CIWA:    COWS:     Musculoskeletal: Strength & Muscle Tone: within normal  limits Gait & Station: normal Patient leans: N/A  Psychiatric Specialty Exam: Physical Exam  Review of Systems  Blood pressure 111/83, pulse 75, temperature 98.4 F (36.9 C), temperature source Oral, resp. rate 18, height 6\' 4"  (1.93 m), weight 94.8 kg, SpO2 100 %.Body mass index is 25.44 kg/m.  General Appearance: well groomed  Eye Contact:  Minimal  Speech:  Clear and Coherent  Volume:  Normal  Mood:  Alright  Affect:  Congruent  Thought Process:  Coherent  Orientation:  Full (Time, Place, and Person)  Thought Content:  Hallucinations: Auditory Visual  Suicidal Thoughts:  Yes.  without intent/plan  Homicidal Thoughts:  No  Memory:  Immediate;   Fair Recent;   Fair Remote;   Fair  Judgement:  Intact  Insight:  Shallow  Psychomotor Activity:  Normal  Concentration:  Concentration: Fair  Recall:  [employer] of Knowledge:  Fair  Language:  Fair  Akathisia:  Negative  Handed:  Right  AIMS (if indicated):     Assets:  Communication Skills Desire for Improvement Housing Physical Health Resilience Social Support  ADL's:  Intact  Cognition:  WNL  Sleep:  Number of Hours: 8     Treatment Plan Summary: Daily contact with patient to assess and evaluate symptoms and progress in treatment and Medication management  1) Schizoaffective Affective Disorder, Depressed type- established problem, unstable - Presenting with AH/VH and suicidal ideations  - Restart Depakote 500 BID  - Restart Seroquel 400 mg QHS, and titrate back to pervious dose of 600 mg as tolerated  2) Cannabis Use Disorder- established problem - 30 minutes of psychotherapy/motivational interviewing performed with patient to develop SMART goal for cannabis smoking cessation  3) Abdominal pain- resolved - Patient no longer endorsing abdominal pain today. Lipase within normal limits, UA within normal limits - Pepto bismol for symptomatic relief, and monitor   2/19 Depakote level Monday morning Seroquel  recently increased on February 18- 600 mg  2/20 Depakote level tomorrow AM.  CPT: 41287  Reggie Pile, MD 08/24/2020, 10:58 AM

## 2020-08-24 NOTE — Progress Notes (Signed)
Patient pleasant and cooperative. Forwards minimal, just smiles and answers yes and no. Pt denies any SI, HI, endorses visual hallucinations but denies AH. Reports he sees shadows. No interaction noted with peers, isolative to self and room.  Encouragement and support provided, safety checks maintained. Medications given as prescribed. Pt receptive and remains safe on unit with q 15 min checks.

## 2020-08-24 NOTE — Progress Notes (Addendum)
Pt is alert and oriented to person, place, time and situation. Pt is calm, cooperative, denies suicidal and homicidal ideation, denies hallucinations. Pt reports he slept well last night, appetite is good, affect is flat, when asked to describe his mood, states, "It's moderate." Pt makes fair eye contact, is soft spoken, forwards little but is cooperative with shift assessment interview. Pt is medication compliant. Pt spends time in his room resting quietly, reports that there is an intrusive and loud peer in on the unit that he does not welcome interaction with him therefore he chooses to spend time resting in his room. Will, continue to monitor pt per Q15 minute face checks and monitor for safety and progress.   Later, in the evening, pt reported having visual hallucinations, started seeing "black dots." Pt reports this is a reoccurring hallucinations he has had on and off for years. Encouraged pt to report this to the doctor tomorrow. Noted and reminded that pt will be taking his scheduled antipsychotic this evening, and that is supposed to help him with these symptoms. Also educated pt to inform doctor if he feels that his symptoms are worsening, and/or if they are improving. Pt verbalized understanding.

## 2020-08-25 ENCOUNTER — Other Ambulatory Visit (HOSPITAL_COMMUNITY): Payer: Self-pay | Admitting: Behavioral Health

## 2020-08-25 LAB — VALPROIC ACID LEVEL: Valproic Acid Lvl: 72 ug/mL (ref 50.0–100.0)

## 2020-08-25 MED ORDER — DIVALPROEX SODIUM 500 MG PO DR TAB: 500.0000 mg | DELAYED_RELEASE_TABLET | Freq: Two times a day (BID) | ORAL | 0 refills | Status: DC

## 2020-08-25 MED ORDER — TRAZODONE HCL 50 MG PO TABS: 50.0000 mg | ORAL_TABLET | Freq: Every evening | ORAL | 0 refills | Status: DC | PRN

## 2020-08-25 MED ORDER — QUETIAPINE FUMARATE 300 MG PO TABS: 600.0000 mg | ORAL_TABLET | Freq: Every day | ORAL | 0 refills | Status: DC

## 2020-08-25 NOTE — BHH Counselor (Signed)
CSW met with pt briefly to discuss aftercare plans. He stated that he was interested in being reconnected with the [facility] for outpatient treatment as he lives in [place], [place]. Pt states that his grandmother may be able to take him for outpatient treatment. No other concerns expressed. Contact ended without incident.    R. , MSW, LCSW, LCAS 08/25/2020 2:24 PM  

## 2020-08-25 NOTE — Plan of Care (Signed)
Patient denies SI/HI/AVH  Problem: Education: Goal: Emotional status will improve Outcome: Progressing Goal: Mental status will improve Outcome: Progressing   

## 2020-08-25 NOTE — Plan of Care (Signed)
  Problem: Education: Goal: Knowledge of [facility] Education information/materials will improve Outcome: Progressing Goal: Emotional status will improve Outcome: Progressing Goal: Mental status will improve Outcome: Progressing Goal: Verbalization of understanding the information provided will improve Outcome: Progressing   Problem: Activity: Goal: Interest or engagement in activities will improve Outcome: Progressing Goal: Sleeping patterns will improve Outcome: Progressing   Problem: Coping: Goal: Ability to verbalize frustrations and anger appropriately will improve Outcome: Progressing Goal: Ability to demonstrate self-control will improve Outcome: Progressing   Problem: Health Behavior/Discharge Planning: Goal: Identification of resources available to assist in meeting health care needs will improve Outcome: Progressing Goal: Compliance with treatment plan for underlying cause of condition will improve Outcome: Progressing   Problem: Physical Regulation: Goal: Ability to maintain clinical measurements within normal limits will improve Outcome: Progressing   Problem: Safety: Goal: Periods of time without injury will increase Outcome: Progressing   Problem: Activity: Goal: Will verbalize the importance of balancing activity with adequate rest periods Outcome: Progressing   Problem: Education: Goal: Will be free of psychotic symptoms Outcome: Progressing Goal: Knowledge of the prescribed therapeutic regimen will improve Outcome: Progressing   Problem: Coping: Goal: Coping ability will improve Outcome: Progressing Goal: Will verbalize feelings Outcome: Progressing   Problem: Health Behavior/Discharge Planning: Goal: Compliance with prescribed medication regimen will improve Outcome: Progressing   Problem: Nutritional: Goal: Ability to achieve adequate nutritional intake will improve Outcome: Progressing   Problem: Role Relationship: Goal:  Ability to communicate needs accurately will improve Outcome: Progressing Goal: Ability to interact with others will improve Outcome: Progressing   Problem: Safety: Goal: Ability to redirect hostility and anger into socially appropriate behaviors will improve Outcome: Progressing Goal: Ability to remain free from injury will improve Outcome: Progressing   Problem: Self-Care: Goal: Ability to participate in self-care as condition permits will improve Outcome: Progressing   Problem: Self-Concept: Goal: Will verbalize positive feelings about self Outcome: Progressing   Problem: Education: Goal: Knowledge of disease or condition will improve Outcome: Progressing Goal: Understanding of discharge needs will improve Outcome: Progressing   Problem: Health Behavior/Discharge Planning: Goal: Ability to identify changes in lifestyle to reduce recurrence of condition will improve Outcome: Progressing Goal: Identification of resources available to assist in meeting health care needs will improve Outcome: Progressing   Problem: Physical Regulation: Goal: Complications related to the disease process, condition or treatment will be avoided or minimized Outcome: Progressing   Problem: Safety: Goal: Ability to remain free from injury will improve Outcome: Progressing   Problem: Education: Goal: Knowledge of disease or condition will improve Outcome: Progressing Goal: Understanding of discharge needs will improve Outcome: Progressing   Problem: Health Behavior/Discharge Planning: Goal: Ability to identify changes in lifestyle to reduce recurrence of condition will improve Outcome: Progressing Goal: Identification of resources available to assist in meeting health care needs will improve Outcome: Progressing   Problem: Physical Regulation: Goal: Complications related to the disease process, condition or treatment will be avoided or minimized Outcome: Progressing   Problem:  Safety: Goal: Ability to remain free from injury will improve Outcome: Progressing

## 2020-08-25 NOTE — Progress Notes (Signed)
Patient has been isolative to self and to room, mostly. Forwards little in conversation. Denies SI, HI and AVH

## 2020-08-25 NOTE — BHH Group Notes (Signed)
LCSW Group Therapy Note   08/25/2020 1:18 PM  Type of Therapy and Topic:  Group Therapy:  Overcoming Obstacles   Participation Level:  Did Not Attend   Description of Group:    In this group patients will be encouraged to explore what they see as obstacles to their own wellness and recovery. They will be guided to discuss their thoughts, feelings, and behaviors related to these obstacles. The group will process together ways to cope with barriers, with attention given to specific choices patients can make. Each patient will be challenged to identify changes they are motivated to make in order to overcome their obstacles. This group will be process-oriented, with patients participating in exploration of their own experiences as well as giving and receiving support and challenge from other group members.   Therapeutic Goals: 1. Patient will identify personal and current obstacles as they relate to admission. 2. Patient will identify barriers that currently interfere with their wellness or overcoming obstacles.  3. Patient will identify feelings, thought process and behaviors related to these barriers. 4. Patient will identify two changes they are willing to make to overcome these obstacles:      Summary of Patient Progress X     Therapeutic Modalities:   Cognitive Behavioral Therapy Solution Focused Therapy Motivational Interviewing Relapse Prevention Therapy  Simona Huh R. Algis Greenhouse, MSW, LCSW, LCAS 08/25/2020 1:18 PM

## 2020-08-25 NOTE — Progress Notes (Signed)
Patient calm and cooperative during assessment denying SI/HI/AVH with this Clinical research associate. Pt was minimal on the unit this evening only coming out of his room for snack and medications. Patient compliant with medication administration per MD orders. Patient given education, support, and encouragement to be active in his treatment plan. Patient being monitored Q 15 minutes for safety per unit protocol. Pt remains safe on the unit.

## 2020-08-25 NOTE — Progress Notes (Signed)
Patient is cooperative with assessment. He presents with a flat affect. Patient denies SI, HI, and AVH this morning. He says he is feeling ok and slept well. Patient came out for breakfast but has spent the rest of the morning so far isolating in his room.  Patient was given medications per MD orders and remains med compliant. Patient remains safe on the unit at this time and q15 minute safety checks are maintained.

## 2020-08-25 NOTE — Progress Notes (Signed)
[facility] MD Progress Note  08/25/2020 1:09 PM Timothy [name]  MRN:  [id number]   CC: "I'm okay"  Subjective:  25 year old male with schizoaffective disorder who presented for bizarre behavior and psychosis. No acute events overnight, medication compliant, attending to ADLs.   Patient seen one-on-one today. He notes that he slept well overnight, and feels somewhat tired this morning. He feels his auditory hallucinations have resolved. He continues to note he sees shadows and this has not improved with medications. He continues to isolate to room for majority of day. He denies suicidal ideations and homicidal ideations. 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  Principal Problem: Schizoaffective disorder, depressive type (HCC) Diagnosis: Principal Problem:   Schizoaffective disorder, depressive type (HCC) Active Problems:   Cannabis use disorder, moderate, dependence (HCC)   Tobacco use disorder  Total Time spent with patient: 30 minutes  Past Psychiatric History: See H&P  Past Medical History:  Past Medical History:  Diagnosis Date  . Cannabis abuse   . Paranoid schizophrenia (HCC)   . Schizoaffective disorder, depressive type (HCC) 09/17/2014   History reviewed. No pertinent surgical history. Family History:  Family History  Problem Relation Age of Onset  . Mental illness Brother   . Drug abuse Brother   . Mental illness Cousin   . Suicidality Cousin   . Diabetes Maternal Grandmother    Family Psychiatric  History: See H&P Social History:  Social History   Substance and Sexual Activity  Alcohol Use Yes  . Alcohol/week: 2.0 standard drinks  . Types: 2 Cans of beer per week     Social History   Substance and Sexual Activity  Drug Use Yes  . Types: Marijuana, MDMA (Ecstacy)    Social History   Socioeconomic History  . Marital status: Single    Spouse name: Not on file  . Number of children: Not  on file  . Years of education: Not on file  . Highest education level: Not on file  Occupational History  . Not on file  Tobacco Use  . Smoking status: Current Some Day Smoker    Packs/day: 0.25  . Smokeless tobacco: Never Used  Vaping Use  . Vaping Use: Never used  Substance and Sexual Activity  . Alcohol use: Yes    Alcohol/week: 2.0 standard drinks    Types: 2 Cans of beer per week  . Drug use: Yes    Types: Marijuana, MDMA (Ecstacy)  . Sexual activity: Yes  Other Topics Concern  . Not on file  Social History Narrative  . Not on file   Social Determinants of Health   Financial Resource Strain: Low Risk   . Difficulty of Paying Living Expenses: Not hard at all  Food Insecurity: No Food Insecurity  . Worried About Programme researcher, broadcasting/film/video in the Last Year: Never true  . Ran Out of Food in the Last Year: Never true  Transportation Needs: No Transportation Needs  . Lack of Transportation (Medical): No  . Lack of Transportation (Non-Medical): No  Physical Activity: Inactive  . Days of Exercise per Week: 0 days  . Minutes of Exercise per Session: 0 min  Stress: Stress Concern Present  . Feeling of Stress : Rather much  Social Connections: Moderately Isolated  . Frequency of Communication with Friends and Family: Once a week  . Frequency of Social Gatherings with Friends and Family: Once a week  . Attends  Religious Services: More than 4 times per year  . Active Member of Clubs or Organizations: Yes  . Attends Banker Meetings: More than 4 times per year  . Marital Status: Never married   Additional Social History:                         Sleep: Fair  Appetite:  Fair  Current Medications: Current Facility-Administered Medications  Medication Dose Route Frequency Provider Last Rate Last Admin  . acetaminophen (TYLENOL) tablet 650 mg  650 mg Oral Q6H PRN Clapacs, Jackquline Denmark, MD   650 mg at 08/24/20 1951  . alum & mag hydroxide-simeth (MAALOX/MYLANTA)  200-200-20 MG/5ML suspension 30 mL  30 mL Oral Q4H PRN Clapacs, Jackquline Denmark, MD   30 mL at 08/21/20 0759  . bismuth subsalicylate (PEPTO BISMOL) chewable tablet 524 mg  524 mg Oral Q4H PRN Jesse Sans, MD      . divalproex (DEPAKOTE) DR tablet 500 mg  500 mg Oral Q12H Jesse Sans, MD   500 mg at 08/25/20 0748  . magnesium hydroxide (MILK OF MAGNESIA) suspension 30 mL  30 mL Oral Daily PRN Clapacs, John T, MD      . QUEtiapine (SEROQUEL) tablet 600 mg  600 mg Oral QHS Jesse Sans, MD   600 mg at 08/24/20 2111  . traZODone (DESYREL) tablet 50 mg  50 mg Oral QHS PRN Jesse Sans, MD   50 mg at 08/23/20 2032    Lab Results:  Results for orders placed or performed during the hospital encounter of 08/20/20 (from the past 48 hour(s))  Valproic acid level     Status: None   Collection Time: 08/25/20  9:04 AM  Result Value Ref Range   Valproic Acid Lvl 72 50.0 - 100.0 ug/mL    Comment: Performed at [facility], [address], [place], [place] [address]    Blood Alcohol level:  Lab Results  Component Value Date   [facility] <10 08/19/2020   ETH <10 04/10/2020    Metabolic Disorder Labs: Lab Results  Component Value Date   HGBA1C 5.4 08/21/2020   MPG 108 08/21/2020   MPG 111 08/19/2020   Lab Results  Component Value Date   PROLACTIN 33.2 (H) 08/15/2016   Lab Results  Component Value Date   CHOL 167 08/21/2020   TRIG 76 08/21/2020   HDL 45 08/21/2020   CHOLHDL 3.7 08/21/2020   VLDL 15 08/21/2020   LDLCALC 107 (H) 08/21/2020   LDLCALC 108 (H) 11/11/2019    Physical Findings: AIMS:  , ,  ,  ,    CIWA:    COWS:     Musculoskeletal: Strength & Muscle Tone: within normal limits Gait & Station: normal Patient leans: N/A  Psychiatric Specialty Exam: Physical Exam   Review of Systems   Blood pressure 115/68, pulse 81, temperature 98.7 F (37.1 C), temperature source Oral, resp. rate 18, height 6\' 4"  (1.93 m), weight 94.8 kg, SpO2 100 %.Body mass index is  25.44 kg/m.  General Appearance: Fairly Groomed  Eye Contact:  Minimal  Speech:  Clear and Coherent  Volume:  Normal  Mood:  Euthymic  Affect:  Constricted and Depressed  Thought Process:  Coherent  Orientation:  Full (Time, Place, and Person)  Thought Content:  Hallucinations: Visual  Suicidal Thoughts:  No  Homicidal Thoughts:  No  Memory:  Immediate;   Fair Recent;   Fair Remote;  Fair  Judgement:  Intact  Insight:  Shallow  Psychomotor Activity:  Normal  Concentration:  Concentration: Fair  Recall:  [employer] of Knowledge:  Fair  Language:  Fair  Akathisia:  Negative  Handed:  Right  AIMS (if indicated):     Assets:  Communication Skills Desire for Improvement Housing Physical Health Resilience Social Support  ADL's:  Intact  Cognition:  WNL  Sleep:  Number of Hours: 8.25     Treatment Plan Summary: Daily contact with patient to assess and evaluate symptoms and progress in treatment and Medication management  1) Schizoaffective Affective Disorder, Depressed type- established problem, improving - Auditory hallucinations have resolved, no longer suicidal. Visual hallucinations remain - Continue Depakote 500 BID- level 72 today - Continue Seroquel 600 mg QHS  2) Cannabis Use Disorder- established problem - Reviewed SMART goals today to help encourage continued cessation   Jesse Sans, MD 08/25/2020, 1:09 PM

## 2020-08-25 NOTE — Progress Notes (Signed)
Recreation Therapy Notes   Date: 08/25/2020  Time: 9:30 am   Location: Craft room     Behavioral response: N/A   Intervention Topic: Time Management   Discussion/Intervention: Patient did not attend group.   Clinical Observations/Feedback:  Patient did not attend group.   Jiovanna Frei LRT/CTRS        Breea Loncar 08/25/2020 12:07 PM

## 2020-08-26 MED ORDER — DIVALPROEX SODIUM 500 MG PO DR TAB: 500.0000 mg | DELAYED_RELEASE_TABLET | Freq: Two times a day (BID) | ORAL | 1 refills | Status: DC

## 2020-08-26 MED ORDER — TRAZODONE HCL 50 MG PO TABS: 50.0000 mg | ORAL_TABLET | Freq: Every evening | ORAL | 1 refills | Status: DC | PRN

## 2020-08-26 MED ORDER — QUETIAPINE FUMARATE 300 MG PO TABS: 600.0000 mg | ORAL_TABLET | Freq: Every day | ORAL | 1 refills | Status: DC

## 2020-08-26 NOTE — Progress Notes (Signed)
Recreation Therapy Notes  Date: 08/26/2020  Time: 9:30 am   Location: Craft room   Behavioral response: Appropriate  Intervention Topic: Problem Solving    Discussion/Intervention:  Group content today was focused on time management. The group defined time management and identified healthy ways to manage time. Individuals expressed how much of the 24 hours they use in a day. Patients expressed how much time they use just for themselves personally. The group expressed how they have managed their time in the past. Individuals participated in the intervention "Managing Life" where they had a chance to see how much of the 24 hours they use and where it goes. Clinical Observations/Feedback: Patient came to group and stated that; he problem solves by walking away or talking to a friend. He explained that his emotions stop him from problem solving in the proper way. Individual was social with staff while participating in the intervention. Rollan Roger LRT/CTRS          Shetara Launer 08/26/2020 11:53 AM

## 2020-08-26 NOTE — BHH Suicide Risk Assessment (Signed)
[facility] Discharge Suicide Risk Assessment   Principal Problem: Schizoaffective disorder, depressive type (HCC) Discharge Diagnoses: Principal Problem:   Schizoaffective disorder, depressive type (HCC) Active Problems:   Cannabis use disorder, moderate, dependence (HCC)   Tobacco use disorder   Total Time spent with patient: 35 minutes- 25 minutes face-to-face contact with patient, 10 minutes documentation, coordination of care, scripts   Musculoskeletal: Strength & Muscle Tone: within normal limits Gait & Station: normal Patient leans: N/A  Psychiatric Specialty Exam: Review of Systems  Constitutional: Negative for appetite change and fatigue.  HENT: Negative for rhinorrhea and sore throat.   Eyes: Negative for photophobia and visual disturbance.  Respiratory: Negative for cough and shortness of breath.   Cardiovascular: Negative for chest pain and palpitations.  Gastrointestinal: Negative for constipation, diarrhea, nausea and vomiting.  Endocrine: Negative for cold intolerance and heat intolerance.  Genitourinary: Negative for difficulty urinating and dysuria.  Musculoskeletal: Negative for arthralgias and myalgias.  Skin: Negative for rash and wound.  Allergic/Immunologic: Negative for food allergies and immunocompromised state.  Neurological: Negative for dizziness and headaches.  Hematological: Negative for adenopathy. Does not bruise/bleed easily.  Psychiatric/Behavioral: Negative for behavioral problems, dysphoric mood, hallucinations and suicidal ideas. The patient is not nervous/anxious.     Blood pressure 116/72, pulse 72, temperature 98.8 F (37.1 C), temperature source Oral, resp. rate 17, height 6\' 4"  (1.93 m), weight 94.8 kg, SpO2 97 %.Body mass index is 25.44 kg/m.  General Appearance: Fairly Groomed  ::  Good  Speech:  Clear and Coherent and Normal Rate  Volume:  Normal  Mood:  Euthymic  Affect:  Congruent  Thought Process:  Coherent  Orientation:   Full (Time, Place, and Person)  Thought Content:  Logical  Suicidal Thoughts:  No  Homicidal Thoughts:  No  Memory:  Immediate;   Fair Recent;   Fair Remote;   Fair  Judgement:  Intact  Insight:  Fair  Psychomotor Activity:  Normal  Concentration:  Fair  Recall:  [id number] of Knowledge:Fair  Language: Fair  Akathisia:  Negative  Handed:  Right  AIMS (if indicated):     Assets:  Communication Skills Desire for Improvement Housing Physical Health Resilience Social Support  Sleep:  Number of Hours: 8.5  Cognition: WNL  ADL's:  Intact   Mental Status Per Nursing Assessment::   On Admission:  Self-harm thoughts  Demographic Factors:  Male  Loss Factors: NA  Historical Factors: Impulsivity  Risk Reduction Factors:   Sense of responsibility to family, Positive social support, Positive therapeutic relationship and Positive coping skills or problem solving skills  Continued Clinical Symptoms:  Schizophrenia:   Paranoid or undifferentiated type Previous Psychiatric Diagnoses and Treatments  Cognitive Features That Contribute To Risk:  None    Suicide Risk:  Minimal: No identifiable suicidal ideation.  Patients presenting with no risk factors but with morbid ruminations; may be classified as minimal risk based on the severity of the depressive symptoms   Follow-up Information    [facility] Follow up on 09/01/2020.   Specialty: Urgent Care Why: Your therapy appointment is on 09/01/20 on walk-in basis. Arrive by 7:45am.   Medication management appointment on 09/18/20 on walk-in basis. Arrive by 7:45am.  Contact information: [phone] [place] [phone]              Plan Of Care/Follow-up recommendations:  Activity:  as tolerated  Diet:  regular diet  [phone], MD 08/26/2020, 11:13 AM

## 2020-08-26 NOTE — BHH Counselor (Signed)
CSW reached out to APS worker Levora Angel [phone]) and APS Program Director Rodena Medin [phone]) to follow up and clarify his request for information. HIPPA complaint voicemail left with contact information for follow up.  Vilma Meckel. Algis Greenhouse, MSW, LCSW, LCAS 08/26/2020 2:12 PM

## 2020-08-26 NOTE — Discharge Summary (Signed)
Physician Discharge Summary Note  Patient:  Timothy [name] is an 25 y.o., male MRN:  [id number] DOB:  08-22-1995 Patient phone:  [phone] (home)  Patient address:   [address] [place] [address],  Total Time spent with patient: 35 minutes- 25 minutes face-to-face contact with patient, 10 minutes documentation, coordination of care, scripts   Date of Admission:  08/20/2020 Date of Discharge: 08/26/2020  Reason for Admission:  25 year old male with schizoaffective disorder who presented for bizarre behavior and psychosis  Principal Problem: Schizoaffective disorder, depressive type (HCC) Discharge Diagnoses: Principal Problem:   Schizoaffective disorder, depressive type (HCC) Active Problems:   Cannabis use disorder, moderate, dependence (HCC)   Tobacco use disorder   Past Psychiatric History: History schizoaffective disorder, depressive type and cannabis use disorder. Multiple hospitalizations, last outpatient follow-up July 2021. History of suicide attempts.   Past Medical History:  Past Medical History:  Diagnosis Date  . Cannabis abuse   . Paranoid schizophrenia (HCC)   . Schizoaffective disorder, depressive type (HCC) 09/17/2014   History reviewed. No pertinent surgical history. Family History:  Family History  Problem Relation Age of Onset  . Mental illness Brother   . Drug abuse Brother   . Mental illness Cousin   . Suicidality Cousin   . Diabetes Maternal Grandmother    Family Psychiatric  History: Brother and cousin with mental illness and substance abuse. Cousin with suicide attempt.  Social History:  Social History   Substance and Sexual Activity  Alcohol Use Yes  . Alcohol/week: 2.0 standard drinks  . Types: 2 Cans of beer per week     Social History   Substance and Sexual Activity  Drug Use Yes  . Types: Marijuana, MDMA (Ecstacy)    Social History   Socioeconomic History  . Marital status: Single    Spouse name: Not on file  . Number  of children: Not on file  . Years of education: Not on file  . Highest education level: Not on file  Occupational History  . Not on file  Tobacco Use  . Smoking status: Current Some Day Smoker    Packs/day: 0.25  . Smokeless tobacco: Never Used  Vaping Use  . Vaping Use: Never used  Substance and Sexual Activity  . Alcohol use: Yes    Alcohol/week: 2.0 standard drinks    Types: 2 Cans of beer per week  . Drug use: Yes    Types: Marijuana, MDMA (Ecstacy)  . Sexual activity: Yes  Other Topics Concern  . Not on file  Social History Narrative  . Not on file   Social Determinants of Health   Financial Resource Strain: Low Risk   . Difficulty of Paying Living Expenses: Not hard at all  Food Insecurity: No Food Insecurity  . Worried About Programme researcher, broadcasting/film/video in the Last Year: Never true  . Ran Out of Food in the Last Year: Never true  Transportation Needs: No Transportation Needs  . Lack of Transportation (Medical): No  . Lack of Transportation (Non-Medical): No  Physical Activity: Inactive  . Days of Exercise per Week: 0 days  . Minutes of Exercise per Session: 0 min  Stress: Stress Concern Present  . Feeling of Stress : Rather much  Social Connections: Moderately Isolated  . Frequency of Communication with Friends and Family: Once a week  . Frequency of Social Gatherings with Friends and Family: Once a week  . Attends Religious Services: More than 4 times per year  . Active  Member of Clubs or Organizations: Yes  . Attends Banker Meetings: More than 4 times per year  . Marital Status: Never married    Hospital Course:  25 year old male with schizoaffective disorder who presented for bizarre behavior and psychosis. While in the hospital he was restarted on Depakote 500 BID, with level 74. He was also restarted on Seroquel and titrated to 600 gm daily. On this combination, mood improved and he no longer endorsed suicidal ideations. Auditory hallucinations also  resolved. He did endorse occasional spots in his vision, but no true visual hallucinations. He also denied homicidal ideations. During his hospital stay patient also expressed interest in cannabis cessation. Motivational interviewing was employed to help him flesh out his motivators for quiting- saving money, getting a better job, having a better relationship with his mom, and having more women be willing to date him. We discussed SMART goals at length, and helped develop a plan to cut back and eventually quit smoking all together. He plans to follow-up at the [facility] for outpatient care.   Physical Findings: AIMS: Facial and Oral Movements Muscles of Facial Expression: None, normal Lips and Perioral Area: None, normal Jaw: None, normal Tongue: None, normal,Extremity Movements Upper (arms, wrists, hands, fingers): None, normal Lower (legs, knees, ankles, toes): None, normal, Trunk Movements Neck, shoulders, hips: None, normal, Overall Severity Severity of abnormal movements (highest score from questions above): None, normal Incapacitation due to abnormal movements: None, normal Patient's awareness of abnormal movements (rate only patient's report): No Awareness, Dental Status Current problems with teeth and/or dentures?: No Does patient usually wear dentures?: No  CIWA:    COWS:     Musculoskeletal: Strength & Muscle Tone: within normal limits Gait & Station: normal Patient leans: N/A  Psychiatric Specialty Exam: Physical Exam Vitals and nursing note reviewed.  Constitutional:      Appearance: Normal appearance.  HENT:     Head: Normocephalic and atraumatic.     Right Ear: External ear normal.     Left Ear: External ear normal.     Nose: Nose normal.     Mouth/Throat:     Mouth: Mucous membranes are moist.     Pharynx: Oropharynx is clear.  Eyes:     Extraocular Movements: Extraocular movements intact.     Conjunctiva/sclera: Conjunctivae  normal.     Pupils: Pupils are equal, round, and reactive to light.  Cardiovascular:     Rate and Rhythm: Normal rate.     Pulses: Normal pulses.  Pulmonary:     Effort: Pulmonary effort is normal.     Breath sounds: Normal breath sounds.  Abdominal:     General: Abdomen is flat.     Palpations: Abdomen is soft.  Musculoskeletal:        General: No swelling. Normal range of motion.     Cervical back: Normal range of motion and neck supple.  Skin:    General: Skin is warm and dry.  Neurological:     General: No focal deficit present.     Mental Status: He is alert and oriented to person, place, and time.  Psychiatric:        Mood and Affect: Mood normal.        Behavior: Behavior normal.        Thought Content: Thought content normal.        Judgment: Judgment normal.     Review of Systems  Constitutional: Negative for appetite change and fatigue.  HENT:  Negative for rhinorrhea and sore throat.   Eyes: Negative for photophobia and visual disturbance.  Respiratory: Negative for cough and shortness of breath.   Cardiovascular: Negative for chest pain and palpitations.  Gastrointestinal: Negative for constipation, diarrhea, nausea and vomiting.  Endocrine: Negative for cold intolerance and heat intolerance.  Genitourinary: Negative for difficulty urinating and dysuria.  Musculoskeletal: Negative for arthralgias and myalgias.  Skin: Negative for rash and wound.  Allergic/Immunologic: Negative for food allergies and immunocompromised state.  Neurological: Negative for dizziness and headaches.  Hematological: Negative for adenopathy. Does not bruise/bleed easily.  Psychiatric/Behavioral: Negative for behavioral problems, dysphoric mood, hallucinations and suicidal ideas. The patient is not nervous/anxious.     Blood pressure 116/72, pulse 72, temperature 98.8 F (37.1 C), temperature source Oral, resp. rate 17, height 6\' 4"  (1.93 m), weight 94.8 kg, SpO2 97 %.Body mass index is  25.44 kg/m.  General Appearance: Fairly Groomed  Patent attorneyye Contact::  Good  Speech:  Clear and Coherent and Normal Rate  Volume:  Normal  Mood:  Euthymic  Affect:  Congruent  Thought Process:  Coherent  Orientation:  Full (Time, Place, and Person)  Thought Content:  Logical  Suicidal Thoughts:  No  Homicidal Thoughts:  No  Memory:  Immediate;   Fair Recent;   Fair Remote;   Fair  Judgement:  Intact  Insight:  Fair  Psychomotor Activity:  Normal  Concentration:  Fair  Recall:  Fair  Fund of Knowledge:Fair  Language: Fair  Akathisia:  Negative  Handed:  Right  AIMS (if indicated):     Assets:  Communication Skills Desire for Improvement Housing Physical Health Resilience Social Support  Sleep:  Number of Hours: 8.5  Cognition: WNL  ADL's:  Intact        Have you used any form of tobacco in the last 30 days? (Cigarettes, Smokeless Tobacco, Cigars, and/or Pipes): Yes  Has this patient used any form of tobacco in the last 30 days? (Cigarettes, Smokeless Tobacco, Cigars, and/or Pipes)  Yes, A prescription for an FDA-approved tobacco cessation medication was offered at discharge and the patient refused  Blood Alcohol level:  Lab Results  Component Value Date   [facility] 08/[facility]   ETH <10 04/10/2020    Metabolic Disorder Labs:  Lab Results  Component Value Date   HGBA1C 5.4 08/21/2020   MPG 108 08/21/2020   MPG 111 08/19/2020   Lab Results  Component Value Date   PROLACTIN 33.2 (H) 08/15/2016   Lab Results  Component Value Date   CHOL 167 08/21/2020   TRIG 76 08/21/2020   HDL 45 08/21/2020   CHOLHDL 3.7 08/21/2020   VLDL 15 08/21/2020   LDLCALC 107 (H) 08/21/2020   LDLCALC 108 (H) 11/11/2019    See Psychiatric Specialty Exam and Suicide Risk Assessment completed by Attending Physician prior to discharge.  Discharge destination:  Home  Is patient on multiple antipsychotic therapies at discharge:  No   Has Patient had three or more failed trials of  antipsychotic monotherapy by history:  No  Recommended Plan for Multiple Antipsychotic Therapies: NA  Discharge Instructions    Diet general   Complete by: As directed    Increase activity slowly   Complete by: As directed      Allergies as of 08/26/2020      Reactions   Haldol [haloperidol Lactate] Other (See Comments)   Muscle stiffness   Abilify [aripiprazole]    eps   Penicillins Hives   Felt like throat was  closing Has patient had a PCN reaction causing immediate rash, facial/tongue/throat swelling, SOB or lightheadedness with hypotension: Unknown Has patient had a PCN reaction causing severe rash involving mucus membranes or skin necrosis: unknown Has patient had a PCN reaction that required hospitalization Unknown Has patient had a PCN reaction occurring within the last 10 years: Unknown If all of the above answers are "NO", then may proceed with Cephalosporin use.   Zyprexa [olanzapine]    eps      Medication List    STOP taking these medications   buPROPion 150 MG 24 hr tablet Commonly known as: WELLBUTRIN XL   hydrOXYzine 25 MG tablet Commonly known as: ATARAX/VISTARIL     TAKE these medications     Indication  divalproex 500 MG DR tablet Commonly known as: DEPAKOTE Take 1 tablet (500 mg total) by mouth every 12 (twelve) hours. What changed:   when to take this  additional instructions  Indication: Schizophrenia   QUEtiapine 300 MG tablet Commonly known as: SEROQUEL Take 2 tablets (600 mg total) by mouth at bedtime. What changed: additional instructions  Indication: Schizophrenia   traZODone 50 MG tablet Commonly known as: DESYREL Take 1 tablet (50 mg total) by mouth at bedtime as needed for sleep.  Indication: Trouble Sleeping       Follow-up Information    [facility] Follow up on 09/01/2020.   Specialty: Urgent Care Why: Your therapy appointment is on 09/01/20 on walk-in basis. Arrive by 7:45am.   Medication  management appointment on 09/18/20 on walk-in basis. Arrive by 7:45am.  Contact information: [address] [place] [address] [phone]              Follow-up recommendations:  Activity:  as tolerated Diet:  regular diet  Comments:  7-day supply of free medications provided to patient along with printed 30-day scripts and one refill  Signed: Jesse Sans, MD 08/26/2020, 11:22 AM

## 2020-08-26 NOTE — Progress Notes (Signed)
Recreation Therapy Notes  INPATIENT RECREATION TR PLAN  Patient Details Name: Timothy [name] MRN: [id number] DOB: 1995-12-18 Today's Date: 08/26/2020  Rec Therapy Plan Is patient appropriate for Therapeutic Recreation?: Yes Treatment times per week: at least 3 Estimated Length of Stay: 5-7 days TR Treatment/Interventions: Group participation (Comment)  Discharge Criteria Pt will be discharged from therapy if:: Discharged Treatment plan/goals/alternatives discussed and agreed upon by:: Patient/family  Discharge Summary Short term goals set: Patient will engage in groups without prompting or encouragement from LRT x3 group sessions within 5 recreation therapy group sessions Short term goals met: Complete Progress toward goals comments: Groups attended Which groups?: Leisure education,AAA/T,Other (Comment) (Problem Solving) Reason goals not met: N/A Therapeutic equipment acquired: N/A Reason patient discharged from therapy: Discharge from hospital Pt/family agrees with progress & goals achieved: Yes Date patient discharged from therapy: 08/26/20   Lawton Dollinger 08/26/2020, 1:12 PM

## 2020-08-26 NOTE — Progress Notes (Signed)
  [facility] Adult Case Management Discharge Plan :  Will you be returning to the same living situation after discharge:  Yes,  pt plans to return to his apartment. At discharge, do you have transportation home?: Yes,  CSW to arrange transportation. Do you have the ability to pay for your medications: No.  Release of information consent forms completed and in the chart;  Patient's signature needed at discharge.  Patient to Follow up at:  Follow-up Information    [facility] Follow up on 09/01/2020.   Specialty: Urgent Care Why: Your therapy appointment is on 09/01/20 on walk-in basis. Arrive by 7:45am.   Medication management appointment on 09/18/20 on walk-in basis. Arrive by 7:45am.  Contact information: [address] [place] [address] [phone]              Next level of care provider has access to [facility] Link:no  Safety Planning and Suicide Prevention discussed: Yes,  SPE completed with Scarlette Slice, mother.  Have you used any form of tobacco in the last 30 days? (Cigarettes, Smokeless Tobacco, Cigars, and/or Pipes): Yes  Has patient been referred to the Quitline?: Patient refused referral  Patient has been referred for addiction treatment: Pt. refused referral  [name], LCSW 08/26/2020, 9:52 AM

## 2020-08-26 NOTE — Progress Notes (Signed)
Discharge Note:   Pt is alert and oriented to person, place, time and situation. Pt is calm, cooperative, pleasant, denies suicidal and homicidal ideation, denies hallucinations, denies feelings of depression and anxiety. Pt given discharge instructions, which include his outpatient follow up appointments, discharge medications, denies medication education, printed prescriptions and his seven day supply of meds, pt verbalized understanding of all; and all personal belongings returned to pt upon discharge. Pt voices no complaints upon discharge, no distress noted or reported.

## 2020-08-26 NOTE — Plan of Care (Signed)
  Problem: Group Participation Goal: STG - Patient will engage in groups without prompting or encouragement from LRT x3 group sessions within 5 recreation therapy group sessions Description: STG - Patient will engage in groups without prompting or encouragement from LRT x3 group sessions within 5 recreation therapy group sessions Outcome: Completed/Met

## 2020-09-29 ENCOUNTER — Other Ambulatory Visit: Payer: Self-pay

## 2020-09-29 ENCOUNTER — Emergency Department (HOSPITAL_COMMUNITY)
Admission: EM | Admit: 2020-09-29 | Discharge: 2020-09-29 | Discharge status: Absent without leave | Payer: No Typology Code available for payment source

## 2020-09-29 ENCOUNTER — Emergency Department
Admission: EM | Admit: 2020-09-29 | Discharge: 2020-10-01 | Disposition: A | Discharge status: Other Acute Inpatient Hospital | Payer: Medicaid Other | Attending: Emergency Medicine | Admitting: Emergency Medicine

## 2020-09-29 DIAGNOSIS — F329 Major depressive disorder, single episode, unspecified: Secondary | ICD-10-CM | POA: Insufficient documentation

## 2020-09-29 DIAGNOSIS — F251 Schizoaffective disorder, depressive type: Secondary | ICD-10-CM | POA: Diagnosis not present

## 2020-09-29 DIAGNOSIS — R45851 Suicidal ideations: Secondary | ICD-10-CM | POA: Insufficient documentation

## 2020-09-29 DIAGNOSIS — Z20822 Contact with and (suspected) exposure to covid-19: Secondary | ICD-10-CM | POA: Insufficient documentation

## 2020-09-29 DIAGNOSIS — F209 Schizophrenia, unspecified: Secondary | ICD-10-CM | POA: Insufficient documentation

## 2020-09-29 DIAGNOSIS — F17200 Nicotine dependence, unspecified, uncomplicated: Secondary | ICD-10-CM | POA: Insufficient documentation

## 2020-09-29 LAB — URINE DRUG SCREEN, QUALITATIVE (ARMC ONLY)
Amphetamines, Ur Screen: NOT DETECTED
Barbiturates, Ur Screen: NOT DETECTED
Benzodiazepine, Ur Scrn: NOT DETECTED
Cannabinoid 50 Ng, Ur ~~LOC~~: NOT DETECTED
Cocaine Metabolite,Ur ~~LOC~~: NOT DETECTED
MDMA (Ecstasy)Ur Screen: NOT DETECTED
Methadone Scn, Ur: NOT DETECTED
Opiate, Ur Screen: NOT DETECTED
Phencyclidine (PCP) Ur S: NOT DETECTED
Tricyclic, Ur Screen: NOT DETECTED

## 2020-09-29 LAB — COMPREHENSIVE METABOLIC PANEL
ALT: 20 U/L (ref 0–44)
AST: 22 U/L (ref 15–41)
Albumin: 4.3 g/dL (ref 3.5–5.0)
Alkaline Phosphatase: 44 U/L (ref 38–126)
Anion gap: 8 (ref 5–15)
BUN: 12 mg/dL (ref 6–20)
CO2: 27 mmol/L (ref 22–32)
Calcium: 8.9 mg/dL (ref 8.9–10.3)
Chloride: 102 mmol/L (ref 98–111)
Creatinine, Ser: 1.04 mg/dL (ref 0.61–1.24)
GFR, Estimated: >60 mL/min (ref >60)
Glucose, Bld: 80 mg/dL (ref 70–99)
Potassium: 3.7 mmol/L (ref 3.5–5.1)
Sodium: 137 mmol/L (ref 135–145)
Total Bilirubin: 1 mg/dL (ref 0.3–1.2)
Total Protein: 7.7 g/dL (ref 6.5–8.1)

## 2020-09-29 LAB — CBC
HCT: 45.6 % (ref 39.0–52.0)
Hemoglobin: 15.2 g/dL (ref 13.0–17.0)
MCH: 29.9 pg (ref 26.0–34.0)
MCHC: 33.3 g/dL (ref 30.0–36.0)
MCV: 89.8 fL (ref 80.0–100.0)
Platelets: 211 10*3/uL (ref 150–400)
RBC: 5.08 MIL/uL (ref 4.22–5.81)
RDW: 12.6 % (ref 11.5–15.5)
WBC: 6 10*3/uL (ref 4.0–10.5)
nRBC: 0 % (ref 0.0–0.2)

## 2020-09-29 LAB — RESP PANEL BY RT-PCR (FLU A&B, COVID) ARPGX2
Influenza A by PCR: NEGATIVE
Influenza B by PCR: NEGATIVE
SARS Coronavirus 2 by RT PCR: NEGATIVE

## 2020-09-29 LAB — ACETAMINOPHEN LEVEL: Acetaminophen (Tylenol), Serum: <10 ug/mL — ABNORMAL LOW (ref 10–30)

## 2020-09-29 LAB — SALICYLATE LEVEL: Salicylate Lvl: <7 mg/dL — ABNORMAL LOW (ref 7.0–30.0)

## 2020-09-29 LAB — VALPROIC ACID LEVEL: Valproic Acid Lvl: 55 ug/mL (ref 50.0–100.0)

## 2020-09-29 LAB — ETHANOL: Alcohol, Ethyl (B): <10 mg/dL (ref <10)

## 2020-09-29 MED ORDER — DIVALPROEX SODIUM 500 MG PO DR TAB
500.0000 mg | DELAYED_RELEASE_TABLET | Freq: Two times a day (BID) | ORAL | Status: DC
  Administered 2020-09-29 – 2020-10-01 (×4): 500 mg via ORAL
  Filled 2020-09-29 (×4): qty 1

## 2020-09-29 MED ORDER — QUETIAPINE FUMARATE 200 MG PO TABS
600.0000 mg | ORAL_TABLET | Freq: Every day | ORAL | Status: DC
  Administered 2020-09-29 – 2020-09-30 (×2): 600 mg via ORAL
  Filled 2020-09-29: qty 3

## 2020-09-29 MED ORDER — TRAZODONE HCL 50 MG PO TABS
50.0000 mg | ORAL_TABLET | Freq: Every day | ORAL | Status: DC
  Administered 2020-09-29 – 2020-09-30 (×2): 50 mg via ORAL
  Filled 2020-09-29 (×2): qty 1

## 2020-09-29 NOTE — BH Assessment (Signed)
UPDATED DISPOSITION: Please be advised that this writer spoke with charge nurse Susy Frizzle, RN who reported that the pt will not be admitted to the unit on 09/29/20 as originally planned. Due to the dynamics in the BMU the pt's acceptance has been rescinded at this time. TTS will continue to seek placement options for patient.   ER staff is aware of the revisions: 1. Inetta Fermo, ER Secretary  2. Quale, ER MD  3. Amy,Patient's Nurse 4. Teresa,Patient Access.

## 2020-09-29 NOTE — BH Assessment (Addendum)
Referral information for Psychiatric Hospitalization faxed to;   [facility] [place] [place] [phone]),   . [facility] [phone])  . [facility] [phone]),  . [facility] [phone]),   . [address] [phone]),    [facility]([phone])  [facility] [phone])

## 2020-09-29 NOTE — ED Notes (Signed)
Report called to kim rn bhu nurse 

## 2020-09-29 NOTE — ED Triage Notes (Signed)
Pt via POV from home. Pt here voluntary. Pt having thoughts of hurting himself by cutting himself. Pt states he does hear voices. Pt states he has a rough childhood and wants help. Pt is poor historian with which meds and medical dx he has. Pt is calm and cooperative in triage.

## 2020-09-29 NOTE — Consult Note (Signed)
[facility] Face-to-Face Psychiatry Consult   Reason for Consult: Consult for this 25 year old man with a history of schizoaffective disorder and substance abuse who came voluntarily to the emergency room complaining of suicidal thoughts Referring Physician: Quale Patient Identification: Timothy [name] MRN:  [id number] Principal Diagnosis: Schizoaffective disorder, depressive type (HCC) Diagnosis:  Principal Problem:   Schizoaffective disorder, depressive type (HCC)   Total Time spent with patient: 1 hour  Subjective:   Timothy [name] is a 25 y.o. male patient admitted with "I was having suicidal thoughts and I need to get it together".  HPI: Patient seen chart reviewed.  25 year old man with a history of mental health problems who was last in the hospital about a month ago walked in today voluntarily saying that he was feeling much worse.  Patient is speaking very quietly and seems disorganized at times so that I do not entirely understand what he is talking about.  He says he has been staying with his uncle and that no one appreciates what he is doing.  He has been feeling worse for days now and says he was having suicidal thoughts.  He says that he cut himself on his arms recently and is having thoughts about cutting himself some more.  Endorses auditory hallucinations which sometimes tell him to harm himself.  Claims that he was compliant with his medications although when I ask for details he became vague and talked about how he "got back on them".  He denied any use of drugs.  I spoke with his mother who tells me she suspects that marijuana use may be involved.  Patient is denying any physical symptoms.  He does talk about trouble sleeping.  He says the medicine he was taking made him sleep too much and so he stopped it.  When he talks about the sleeping medicine I am not sure if he means the trazodone or the Seroquel.  He is calm not hostile and cooperative and asking for admission.  Past Psychiatric  History: History of schizoaffective disorder depressive type cannabis abuse.  Recent hospitalization just about a month ago  Risk to Self:   Risk to Others:   Prior Inpatient Therapy:   Prior Outpatient Therapy:    Past Medical History:  Past Medical History:  Diagnosis Date  . Cannabis abuse   . Paranoid schizophrenia (HCC)   . Schizoaffective disorder, depressive type (HCC) 09/17/2014   No past surgical history on file. Family History:  Family History  Problem Relation Age of Onset  . Mental illness Brother   . Drug abuse Brother   . Mental illness Cousin   . Suicidality Cousin   . Diabetes Maternal Grandmother    Family Psychiatric  History: Mental illness and some members of the family including a brother Social History:  Social History   Substance and Sexual Activity  Alcohol Use Yes  . Alcohol/week: 2.0 standard drinks  . Types: 2 Cans of beer per week     Social History   Substance and Sexual Activity  Drug Use Yes  . Types: Marijuana, MDMA (Ecstacy)    Social History   Socioeconomic History  . Marital status: Single    Spouse name: Not on file  . Number of children: Not on file  . Years of education: Not on file  . Highest education level: Not on file  Occupational History  . Not on file  Tobacco Use  . Smoking status: Current Some Day Smoker    Packs/day: 0.25  .  Smokeless tobacco: Never Used  Vaping Use  . Vaping Use: Never used  Substance and Sexual Activity  . Alcohol use: Yes    Alcohol/week: 2.0 standard drinks    Types: 2 Cans of beer per week  . Drug use: Yes    Types: Marijuana, MDMA (Ecstacy)  . Sexual activity: Yes  Other Topics Concern  . Not on file  Social History Narrative  . Not on file   Social Determinants of Health   Financial Resource Strain: Low Risk   . Difficulty of Paying Living Expenses: Not hard at all  Food Insecurity: No Food Insecurity  . Worried About Programme researcher, broadcasting/film/video in the Last Year: Never true  . Ran  Out of Food in the Last Year: Never true  Transportation Needs: No Transportation Needs  . Lack of Transportation (Medical): No  . Lack of Transportation (Non-Medical): No  Physical Activity: Inactive  . Days of Exercise per Week: 0 days  . Minutes of Exercise per Session: 0 min  Stress: Stress Concern Present  . Feeling of Stress : Rather much  Social Connections: Moderately Isolated  . Frequency of Communication with Friends and Family: Once a week  . Frequency of Social Gatherings with Friends and Family: Once a week  . Attends Religious Services: More than 4 times per year  . Active Member of Clubs or Organizations: Yes  . Attends Banker Meetings: More than 4 times per year  . Marital Status: Never married   Additional Social History:    Allergies:   Allergies  Allergen Reactions  . Haldol [Haloperidol Lactate] Other (See Comments)    Muscle stiffness  . Abilify [Aripiprazole]     eps  . Penicillins Hives    Felt like throat was closing Has patient had a PCN reaction causing immediate rash, facial/tongue/throat swelling, SOB or lightheadedness with hypotension: Unknown Has patient had a PCN reaction causing severe rash involving mucus membranes or skin necrosis: unknown Has patient had a PCN reaction that required hospitalization Unknown Has patient had a PCN reaction occurring within the last 10 years: Unknown If all of the above answers are "NO", then may proceed with Cephalosporin use.   . Zyprexa [Olanzapine]     eps    Labs:  Results for orders placed or performed during the hospital encounter of 09/29/20 (from the past 48 hour(s))  Comprehensive metabolic panel     Status: None   Collection Time: 09/29/20  3:45 PM  Result Value Ref Range   Sodium 137 135 - 145 mmol/L   Potassium 3.7 3.5 - 5.1 mmol/L   Chloride 102 98 - 111 mmol/L   CO2 27 22 - 32 mmol/L   Glucose, Bld 80 70 - 99 mg/dL    Comment: Glucose reference range applies only to samples  taken after fasting for at least 8 hours.   BUN 12 6 - 20 mg/dL   Creatinine, Ser 6.31 0.61 - 1.24 mg/dL   Calcium 8.9 8.9 - 49.7 mg/dL   Total Protein 7.7 6.5 - 8.1 g/dL   Albumin 4.3 3.5 - 5.0 g/dL   AST 22 15 - 41 U/L   ALT 20 0 - 44 U/L   Alkaline Phosphatase 44 38 - 126 U/L   Total Bilirubin 1.0 0.3 - 1.2 mg/dL   GFR, Estimated >02 >63 mL/min    Comment: (NOTE) Calculated using the CKD-EPI Creatinine Equation (2021)    Anion gap 8 5 - 15    Comment:  Performed at [facility], [address] [place], [place] [address]  Salicylate level     Status: Abnormal   Collection Time: 09/29/20  3:45 PM  Result Value Ref Range   Salicylate Lvl <7.0 (L) 7.0 - 30.0 mg/dL    Comment: Performed at [facility], [address] [place], [place] [address]  Acetaminophen level     Status: Abnormal   Collection Time: 09/29/20  3:45 PM  Result Value Ref Range   Acetaminophen (Tylenol), Serum <10 (L) 10 - 30 ug/mL    Comment: (NOTE) Therapeutic concentrations vary significantly. A range of 10-30 ug/mL  may be an effective concentration for many patients. However, some  are best treated at concentrations outside of this range. Acetaminophen concentrations >150 ug/mL at 4 hours after ingestion  and >50 ug/mL at 12 hours after ingestion are often associated with  toxic reactions.  Performed at [facility], [address] [place], [place] [address]   cbc     Status: None   Collection Time: 09/29/20  3:45 PM  Result Value Ref Range   WBC 6.0 4.0 - 10.5 K/uL   RBC 5.08 4.22 - 5.81 MIL/uL   Hemoglobin 15.2 13.0 - 17.0 g/dL   HCT 06.2 37.6 - 28.3 %   MCV 89.8 80.0 - 100.0 fL   MCH 29.9 26.0 - 34.0 pg   MCHC 33.3 30.0 - 36.0 g/dL   RDW 15.1 76.1 - 60.7 %   Platelets 211 150 - 400 K/uL   nRBC 0.0 0.0 - 0.2 %    Comment: Performed at [facility], [address], [place], [place] [address]    Current Facility-Administered Medications  Medication  Dose Route Frequency Provider Last Rate Last Admin  . divalproex (DEPAKOTE) DR tablet 500 mg  500 mg Oral Q12H Tumeka Chimenti T, MD      . QUEtiapine (SEROQUEL) tablet 600 mg  600 mg Oral QHS Naziya Hegwood T, MD      . traZODone (DESYREL) tablet 50 mg  50 mg Oral QHS Demeco Ducksworth, Jackquline Denmark, MD       Current Outpatient Medications  Medication Sig Dispense Refill  . divalproex (DEPAKOTE) 500 MG DR tablet Take 1 tablet (500 mg total) by mouth every 12 (twelve) hours. 60 tablet 1  . QUEtiapine (SEROQUEL) 300 MG tablet Take 2 tablets (600 mg total) by mouth at bedtime. 60 tablet 1  . traZODone (DESYREL) 50 MG tablet Take 1 tablet (50 mg total) by mouth at bedtime as needed for sleep. 30 tablet 1    Musculoskeletal: Strength & Muscle Tone: within normal limits Gait & Station: normal Patient leans: N/A            Psychiatric Specialty Exam:  Presentation  General Appearance: No data recorded Eye Contact:No data recorded Speech:No data recorded Speech Volume:No data recorded Handedness:No data recorded  Mood and Affect  Mood:No data recorded Affect:No data recorded  Thought Process  Thought Processes:No data recorded Descriptions of Associations:No data recorded Orientation:No data recorded Thought Content:No data recorded History of Schizophrenia/Schizoaffective disorder:Yes  Duration of Psychotic Symptoms:Greater than six months  Hallucinations:No data recorded Ideas of Reference:No data recorded Suicidal Thoughts:No data recorded Homicidal Thoughts:No data recorded  Sensorium  Memory:No data recorded Judgment:No data recorded Insight:No data recorded  Executive Functions  Concentration:No data recorded Attention Span:No data recorded Recall:No data recorded Fund of Knowledge:No data recorded Language:No data recorded  Psychomotor Activity  Psychomotor Activity:No data recorded  Assets  Assets:No data  recorded  Sleep  Sleep:No data recorded  Physical  Exam: Physical Exam Vitals and nursing note reviewed.  Constitutional:      Appearance: Normal appearance.  HENT:     Head: Normocephalic and atraumatic.     Mouth/Throat:     Pharynx: Oropharynx is clear.  Eyes:     Pupils: Pupils are equal, round, and reactive to light.  Cardiovascular:     Rate and Rhythm: Normal rate and regular rhythm.  Pulmonary:     Effort: Pulmonary effort is normal.     Breath sounds: Normal breath sounds.  Abdominal:     General: Abdomen is flat.     Palpations: Abdomen is soft.  Musculoskeletal:        General: Normal range of motion.  Skin:    General: Skin is warm and dry.  Neurological:     General: No focal deficit present.     Mental Status: He is alert. Mental status is at baseline.  Psychiatric:        Attention and Perception: Attention normal. He perceives auditory hallucinations.        Mood and Affect: Mood is depressed. Affect is blunt.        Speech: Speech is delayed.        Behavior: Behavior is slowed.        Thought Content: Thought content is paranoid. Thought content includes suicidal ideation. Thought content includes suicidal plan.        Cognition and Memory: Memory is impaired.        Judgment: Judgment is impulsive.    Review of Systems  Constitutional: Negative.   HENT: Negative.   Eyes: Negative.   Respiratory: Negative.   Cardiovascular: Negative.   Gastrointestinal: Negative.   Musculoskeletal: Negative.   Skin: Negative.   Neurological: Negative.   Psychiatric/Behavioral: Positive for depression, hallucinations and suicidal ideas. The patient is nervous/anxious.    Blood pressure 125/74, pulse (!) 52, temperature 97.8 F (36.6 C), temperature source Oral, resp. rate 20, height 6\' 4"  (1.93 m), weight 113.4 kg, SpO2 99 %. Body mass index is 30.43 kg/m.  Treatment Plan Summary: Medication management and Plan 25 year old man he is calm but looks sad and depressed holding his head in his hands.  Cooperative but  hard to understand or follow the whole story.  Endorsing suicidal thoughts.  Labs are being reviewed and so far are negative.  COVID test still pending.  I have added a Depakote level to the orders.  Patient will be restarted on Depakote Seroquel and trazodone which was what he was taking when he was here last time.  Spoke with TTS and inpatient team and we will plan for voluntary admission if everything looks okay.  Disposition: Recommend psychiatric Inpatient admission when medically cleared. Supportive therapy provided about ongoing stressors.  Mordecai RasmussenJohn Florrie Ramires, MD 09/29/2020 4:20 PM

## 2020-09-29 NOTE — ED Notes (Signed)
Belonging include:  Black shirt Plaid shirt Green pants Black hat  1 pair yellow shoes  1 pair of white socks Blue underwear  White envelope

## 2020-09-29 NOTE — ED Notes (Signed)
Pt. To BHU from ED ambulatory without difficulty, to room  BHU 5. Report from Amy RN. Pt. Is alert and oriented, warm and dry in no distress. Pt. Denies SI, HI, and AVH. Pt. Calm and cooperative. Pt. Made aware of security cameras and Q15 minute rounds. Pt. Encouraged to let Nursing staff know of any concerns or needs.   ENVIRONMENTAL ASSESSMENT Potentially harmful objects out of patient reach: Yes.   Personal belongings secured: Yes.   Patient dressed in hospital provided attire only: Yes.   Plastic bags out of patient reach: Yes.   Patient care equipment (cords, cables, call bells, lines, and drains) shortened, removed, or accounted for: Yes.   Equipment and supplies removed from bottom of stretcher: Yes.   Potentially toxic materials out of patient reach: Yes.   Sharps container removed or out of patient reach: Yes.

## 2020-09-29 NOTE — BH Assessment (Signed)
Patient can come down after 10pm  Call to give report: [phone]  Patient is to be admitted to [facility] BMU byDr. Neale Burly  Attending Physician will be.Dr. Neale Burly  Patient has been assigned to room(TBA), by [facility] Charge NurseGigi, RN.  Intake Paper Work has been signed and placed on patient chart.  ER staff is aware of the admission: 1. Misty Stanley, ER Secretary  2. Quale, ER MD  3. Annie,Patient's Nurse  4. Ethelene Browns, Patient Access.

## 2020-09-29 NOTE — ED Notes (Signed)
Resumed care from bri rn.  Pt sleeping.  Pt waiting on admission bed.

## 2020-09-29 NOTE — ED Provider Notes (Signed)
The patient has been placed in psychiatric observation due to the need to provide a safe environment for the patient while obtaining psychiatric consultation and evaluation, as well as ongoing medical and medication management to treat the patient's condition.  The patient has not been placed under full IVC at this time.    Sharyn Creamer, MD 09/29/20 [phone]

## 2020-09-29 NOTE — ED Notes (Signed)
VOL CONSULT  DONE  PENDING  GOING  TO  BEH  MED  TONIGHT

## 2020-09-29 NOTE — ED Provider Notes (Signed)
[facility] Emergency Department Provider Note   ____________________________________________   Event Date/Time   First MD Initiated Contact with Patient 09/29/20 1551     (approximate)  I have reviewed the triage vital signs and the nursing notes.   HISTORY  Chief Complaint Psychiatric Evaluation    HPI Timothy [name] is a 25 y.o. male history of paranoid schizophrenia  Patient reports he came today is living in his uncles house, he has been having some thoughts about hurting himself and feeling like he is having worsening of his symptoms of depression.  He denies any recent medical illness.  He has been living with his uncle for a little while.  Denies any fevers or chills no chest pain no trouble breathing no headaches.  His symptoms seem to be ongoing for a while but are getting worse, he felt like he may need to be admitted to psychiatry for these worsening symptoms.  Does take 1 or 2 psychiatric medications   Past Medical History:  Diagnosis Date  . Cannabis abuse   . Paranoid schizophrenia (HCC)   . Schizoaffective disorder, depressive type (HCC) 09/17/2014    Patient Active Problem List   Diagnosis Date Noted  . Benzodiazepine abuse (HCC) 08/13/2016  . Schizoaffective disorder, depressive type (HCC) 08/12/2016  . Schizoaffective disorder, bipolar type (HCC) 09/18/2014  . Tobacco use disorder 09/18/2014  . 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 Cannabis use disorder, moderate, dependence (HCC) 09/17/2014    No past surgical history on file.  Prior to Admission medications   Medication Sig Start Date End Date Taking? Authorizing Provider  divalproex (DEPAKOTE) 500 MG DR tablet Take 1 tablet (500 mg total) by mouth every 12 (twelve) hours. 08/26/20   Jesse Sans, MD  QUEtiapine (SEROQUEL) 300 MG tablet Take 2 tablets (600 mg total) by mouth at bedtime. 08/26/20   Jesse Sans, MD  traZODone (DESYREL) 50 MG tablet Take  1 tablet (50 mg total) by mouth at bedtime as needed for sleep. 08/26/20   Jesse Sans, MD    Allergies Haldol [haloperidol lactate], Abilify [aripiprazole], Penicillins, and Zyprexa [olanzapine]  Family History  Problem Relation Age of Onset  . Mental illness Brother   . Drug abuse Brother   . Mental illness Cousin   . Suicidality Cousin   . Diabetes Maternal Grandmother     Social History Social History   Tobacco Use  . Smoking status: Current Some Day Smoker    Packs/day: 0.25  . Smokeless tobacco: Never Used  Vaping Use  . Vaping Use: Never used  Substance Use Topics  . Alcohol use: Yes    Alcohol/week: 2.0 standard drinks    Types: 2 Cans of beer per week  . Drug use: Yes    Types: Marijuana, MDMA (Ecstacy)    Review of Systems Constitutional: No fever/chills Eyes: No visual changes. ENT: No sore throat. Cardiovascular: Denies chest pain. Respiratory: Denies shortness of breath. Gastrointestinal: No abdominal pain.   Musculoskeletal: Negative for back pain. Skin: Negative for rash. Neurological: Negative for headaches or weakness. Psychiatric: Reports feeling depressed, some suicidal thoughts.  He wants to be here, and is very interested in being admitted.  Voluntary at this time   ____________________________________________   PHYSICAL EXAM:  VITAL SIGNS: ED Triage Vitals  Enc Vitals Group     BP 09/29/20 1549 125/74     Pulse Rate 09/29/20 1549 (!) 52     Resp 09/29/20 1549 20  Temp 09/29/20 1549 97.8 F (36.6 C)     Temp Source 09/29/20 1549 Oral     SpO2 09/29/20 1549 99 %     Weight 09/29/20 1550 250 lb (113.4 kg)     Height 09/29/20 1550 6\' 4"  (1.93 m)     Head Circumference --      Peak Flow --      Pain Score 09/29/20 1549 0     Pain Loc --      Pain Edu? --      Excl. in GC? --     Constitutional: Alert and oriented. Well appearing and in no acute distress.  Somewhat flattened affect, but gives fist bump on arrival to the  room, very pleasant. Eyes: Conjunctivae are normal. Head: Atraumatic. Nose: No congestion/rhinnorhea. Mouth/Throat: Mucous membranes are moist. Neck: No stridor.  Cardiovascular: Normal rate, regular rhythm. Grossly normal heart sounds.  Good peripheral circulation. Respiratory: Normal respiratory effort.  No retractions. Lungs CTAB. Gastrointestinal: No distention. Musculoskeletal: No lower extremity tenderness nor edema. Neurologic:  Normal speech and language. No gross focal neurologic deficits are appreciated.  Skin:  Skin is warm, dry and intact. No rash noted. Psychiatric: Mood and affect are n somewhat flat, endorses some suicidal ideations.  Denies wanting to acutely kill himself.  He is here voluntarily, and reports that he would like to stay in the hospital for psychiatric care. ____________________________________________   LABS (all labs ordered are listed, but only abnormal results are displayed)  Labs Reviewed  SALICYLATE LEVEL - Abnormal; Notable for the following components:      Result Value   Salicylate Lvl <7.0 (*)    All other components within normal limits  ACETAMINOPHEN LEVEL - Abnormal; Notable for the following components:   Acetaminophen (Tylenol), Serum <10 (*)    All other components within normal limits  RESP PANEL BY RT-PCR (FLU A&B, COVID) ARPGX2  COMPREHENSIVE METABOLIC PANEL  ETHANOL  CBC  VALPROIC ACID LEVEL  URINE DRUG SCREEN, QUALITATIVE ([facility] ONLY)  POC SARS CORONAVIRUS 2 AG -  ED   ____________________________________________  EKG   ____________________________________________  RADIOLOGY   ____________________________________________   PROCEDURES  Procedure(s) performed: None  Procedures  Critical Care performed: No  ____________________________________________   INITIAL IMPRESSION / ASSESSMENT AND PLAN / ED COURSE  Pertinent labs & imaging results that were available during my care of the patient were reviewed by me  and considered in my medical decision making (see chart for details).   Reassuring exam with normal vital signs.  Patient without acute medical complaint.  Established history of schizophrenia.  He has been seen and evaluated by Dr. 10/01/20 here today, and Dr. Toni Amend recommends voluntary admission to psychiatric unit at this point.  I am agreeable with this plan.  We will proceed on that course.  I do think as the patient is motivated to stay, came voluntarily that he can remain voluntary at this moment in time, but should he decide to elope I would consider potentially placing him under involuntary commitment though I do not see that as a significant risk at this point given the patient's strong desire to be admitted.  Labs reviewed to this point, unremarkable.     ----------------------------------------- 4:21 PM on 09/29/2020 -----------------------------------------  Patient placed into psychiatric observation at this time. ____________________________________________   FINAL CLINICAL IMPRESSION(S) / ED DIAGNOSES  Final diagnoses:  Suicidal ideation        Note:  This document was prepared using Dragon voice recognition software and  may include unintentional dictation errors       Sharyn Creamer, MD 09/29/20 1744

## 2020-09-30 NOTE — ED Notes (Signed)
Meal tray given 

## 2020-09-30 NOTE — ED Notes (Signed)
Hourly rounding completed at this time, patient currently awake in room. No complaints, stable, and in no acute distress. Q15 minute rounds and monitoring via Security Cameras to continue. 

## 2020-09-30 NOTE — BH Assessment (Addendum)
Referral checks:    [facility] ([phone]- [phone]), Kathlene November requested a refax. Task completed 6:09 AM    [facility] [phone] & [phone]) Per, Leta Jungling no beds available at this time.    [facility] ([phone]), Refax requested. Task completed at 6:27AM.   [facility] [phone] appropriate beds ([facility] only)   [facility] [phone]), No answer   [facility]([phone]) No answer   [facility] [phone]) Per Viviann Spare, pt has out of network insurance.

## 2020-09-30 NOTE — ED Notes (Signed)
VS will be taken once patient wakes.

## 2020-09-30 NOTE — ED Notes (Signed)
Hourly rounding completed at this time, patient currently asleep in room. No complaints, stable, and in no acute distress. Q15 minute rounds and monitoring via Security Cameras to continue. 

## 2020-09-30 NOTE — ED Notes (Addendum)
VS will be taken once patient is awake.  

## 2020-09-30 NOTE — ED Provider Notes (Signed)
Emergency Medicine Observation Re-evaluation Note  Timothy [name] is a 25 y.o. male, seen on rounds today.  Pt initially presented to the ED for complaints of Psychiatric Evaluation Currently, the patient is sleeping comfortably, denies complaints when woken.  Physical Exam  BP 125/74 (BP Location: Right Arm)   Pulse (!) 52   Temp 97.8 F (36.6 C) (Oral)   Resp 20   Ht 6\' 4"  (1.93 m)   Wt 113.4 kg   SpO2 99%   BMI 30.43 kg/m  Physical Exam Constitutional: Resting comfortably. Eyes: Conjunctivae are normal. Head: Atraumatic. Nose: No congestion/rhinnorhea. Mouth/Throat: Mucous membranes are moist. Neck: Normal ROM Cardiovascular: No cyanosis noted. Respiratory: Normal respiratory effort. Gastrointestinal: Non-distended. Genitourinary: deferred Musculoskeletal: No lower extremity tenderness nor edema. Neurologic:  Normal speech and language. No gross focal neurologic deficits are appreciated. Skin:  Skin is warm, dry and intact. No rash noted.    ED Course / MDM  EKG:   I have reviewed the labs performed to date as well as medications administered while in observation.  Recent changes in the last 24 hours include none.  Plan  Current plan is for psychiatric admission. Patient is not under full IVC at this time.   , MD 09/30/20 [phone]

## 2020-09-30 NOTE — ED Notes (Signed)
Report received from Amy, RN including  Situation, Background, Assessment, and Recommendations. Patient alert and oriented, warm and dry, in no acute distress. Patient denies SI, HI, AVH and pain. Patient made aware of Q15 minute rounds and security cameras for their safety. Patient instructed to come to this nurse with needs or concerns. 

## 2020-09-30 NOTE — ED Notes (Signed)
Meal tray placed in room 

## 2020-09-30 NOTE — ED Notes (Signed)
Pt awake and to restroom now

## 2020-09-30 NOTE — ED Notes (Signed)
Pt denies SI at this time, states he has had AH but none now. Pt is alert and oriented x 4, has been sleeping. Provided with Sprite and water per request.

## 2020-09-30 NOTE — ED Notes (Signed)
Pt provided with snack - [place] sandwich tray, graham crackers, peanut butter, [place] Ice, and sprite to drink

## 2020-10-01 ENCOUNTER — Inpatient Hospital Stay
Admission: RE | Admit: 2020-10-01 | Discharge: 2020-10-08 | DRG: 885 | Disposition: A | Discharge status: Home / Self Care | Payer: 59 | Attending: Behavioral Health | Admitting: Behavioral Health

## 2020-10-01 ENCOUNTER — Other Ambulatory Visit: Payer: Self-pay

## 2020-10-01 ENCOUNTER — Encounter: Payer: Self-pay | Admitting: Psychiatry

## 2020-10-01 DIAGNOSIS — F251 Schizoaffective disorder, depressive type: Secondary | ICD-10-CM | POA: Diagnosis present

## 2020-10-01 DIAGNOSIS — Z818 Family history of other mental and behavioral disorders: Secondary | ICD-10-CM | POA: Diagnosis not present

## 2020-10-01 DIAGNOSIS — Z813 Family history of other psychoactive substance abuse and dependence: Secondary | ICD-10-CM

## 2020-10-01 DIAGNOSIS — Z720 Tobacco use: Secondary | ICD-10-CM | POA: Diagnosis not present

## 2020-10-01 DIAGNOSIS — F200 Paranoid schizophrenia: Secondary | ICD-10-CM | POA: Diagnosis present

## 2020-10-01 DIAGNOSIS — Z20822 Contact with and (suspected) exposure to covid-19: Secondary | ICD-10-CM | POA: Diagnosis present

## 2020-10-01 DIAGNOSIS — F250 Schizoaffective disorder, bipolar type: Secondary | ICD-10-CM

## 2020-10-01 DIAGNOSIS — F1220 Cannabis dependence, uncomplicated: Secondary | ICD-10-CM | POA: Diagnosis present

## 2020-10-01 DIAGNOSIS — Z888 Allergy status to other drugs, medicaments and biological substances status: Secondary | ICD-10-CM

## 2020-10-01 DIAGNOSIS — F1310 Sedative, hypnotic or anxiolytic abuse, uncomplicated: Secondary | ICD-10-CM | POA: Diagnosis present

## 2020-10-01 DIAGNOSIS — Z833 Family history of diabetes mellitus: Secondary | ICD-10-CM

## 2020-10-01 DIAGNOSIS — Z880 Allergy status to penicillin: Secondary | ICD-10-CM | POA: Diagnosis not present

## 2020-10-01 DIAGNOSIS — G4710 Hypersomnia, unspecified: Secondary | ICD-10-CM | POA: Diagnosis present

## 2020-10-01 DIAGNOSIS — R45851 Suicidal ideations: Secondary | ICD-10-CM | POA: Diagnosis present

## 2020-10-01 DIAGNOSIS — Z9151 Personal history of suicidal behavior: Secondary | ICD-10-CM | POA: Diagnosis not present

## 2020-10-01 DIAGNOSIS — F17200 Nicotine dependence, unspecified, uncomplicated: Secondary | ICD-10-CM | POA: Diagnosis present

## 2020-10-01 DIAGNOSIS — Z79899 Other long term (current) drug therapy: Secondary | ICD-10-CM | POA: Diagnosis not present

## 2020-10-01 MED ORDER — DIVALPROEX SODIUM 500 MG PO DR TAB
500.0000 mg | DELAYED_RELEASE_TABLET | Freq: Two times a day (BID) | ORAL | Status: DC
  Administered 2020-10-01 – 2020-10-08 (×14): 500 mg via ORAL
  Filled 2020-10-01 (×14): qty 1

## 2020-10-01 MED ORDER — TRAZODONE HCL 50 MG PO TABS: 50.0000 mg | ORAL_TABLET | Freq: Every day | ORAL | Status: DC

## 2020-10-01 MED ORDER — ACETAMINOPHEN 325 MG PO TABS
650.0000 mg | ORAL_TABLET | Freq: Four times a day (QID) | ORAL | Status: DC | PRN
  Administered 2020-10-06: 650 mg via ORAL
  Filled 2020-10-01: qty 2

## 2020-10-01 MED ORDER — QUETIAPINE FUMARATE 200 MG PO TABS: 600.0000 mg | ORAL_TABLET | Freq: Every day | ORAL | Status: DC

## 2020-10-01 MED ORDER — BENZTROPINE MESYLATE 1 MG PO TABS
1.0000 mg | ORAL_TABLET | Freq: Every day | ORAL | Status: DC
  Administered 2020-10-01: 1 mg via ORAL
  Filled 2020-10-01: qty 1

## 2020-10-01 MED ORDER — QUETIAPINE FUMARATE 200 MG PO TABS
400.0000 mg | ORAL_TABLET | Freq: Every day | ORAL | Status: DC
  Administered 2020-10-01: 400 mg via ORAL
  Filled 2020-10-01: qty 2

## 2020-10-01 MED ORDER — MAGNESIUM HYDROXIDE 400 MG/5ML PO SUSP: 30.0000 mL | Freq: Every day | ORAL | Status: DC | PRN

## 2020-10-01 MED ORDER — ARIPIPRAZOLE 10 MG PO TABS
10.0000 mg | ORAL_TABLET | Freq: Every day | ORAL | Status: DC
  Administered 2020-10-01 – 2020-10-03 (×3): 10 mg via ORAL
  Filled 2020-10-01 (×3): qty 1

## 2020-10-01 MED ORDER — ALUM & MAG HYDROXIDE-SIMETH 200-200-20 MG/5ML PO SUSP: 30.0000 mL | ORAL | Status: DC | PRN

## 2020-10-01 MED ORDER — TRAZODONE HCL 50 MG PO TABS
50.0000 mg | ORAL_TABLET | Freq: Every evening | ORAL | Status: DC | PRN
  Administered 2020-10-02 – 2020-10-06 (×2): 50 mg via ORAL
  Filled 2020-10-01 (×2): qty 1
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
## 2020-10-01 NOTE — ED Notes (Signed)
Hourly rounding completed at this time, patient currently asleep in room. No complaints, stable, and in no acute distress. Q15 minute rounds and monitoring via Security Cameras to continue. 
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
## 2020-10-01 NOTE — Consult Note (Signed)
[facility] Face-to-Face Psychiatry Consult   Reason for Consult: Follow-up consult 25 year old man with history of schizoaffective disorder who is still in the emergency room Referring Physician: Fuller Plan Patient Identification: Timothy [name] MRN:  [id number] Principal Diagnosis: Schizoaffective disorder, depressive type (HCC) Diagnosis:  Principal Problem:   Schizoaffective disorder, depressive type (HCC)   Total Time spent with patient: 30 minutes  Subjective:   Timothy [name] is a 25 y.o. male patient admitted with "not very good".  HPI: Patient seen for follow-up today.  This 25 year old man came to the emergency room with complaints of depression hallucinations generally feeling bad some degree of suicidal ideation.  He was scheduled for admission to the unit the night before last but because of circumstances beyond our control of bed has not been available until today.  Patient states he is still feeling bad.  He looks on the edge of tears when talking.  Says he still feels very depressed and negative.  Has intermittent auditory hallucinations and still has passive suicidal thoughts.  Vitals remained stable.  No new labs today.  Patient has been cooperative with treatment and remains calm and actually usually withdrawn in his room.  Past Psychiatric History: Past history of schizoaffective disorder previous hospitalizations and treatment  Risk to Self:   Risk to Others:   Prior Inpatient Therapy:   Prior Outpatient Therapy:    Past Medical History:  Past Medical History:  Diagnosis Date  . Cannabis abuse   . Paranoid schizophrenia (HCC)   . Schizoaffective disorder, depressive type (HCC) 09/17/2014   No past surgical history on file. Family History:  Family History  Problem Relation Age of Onset  . Mental illness Brother   . Drug abuse Brother   . Mental illness Cousin   . Suicidality Cousin   . Diabetes Maternal Grandmother    Family Psychiatric  History: See previous.  Positive  for some mental illness and other family members Social History:  Social History   Substance and Sexual Activity  Alcohol Use Yes  . Alcohol/week: 2.0 standard drinks  . Types: 2 Cans of beer per week     Social History   Substance and Sexual Activity  Drug Use Yes  . Types: Marijuana, MDMA (Ecstacy)    Social History   Socioeconomic History  . Marital status: Single    Spouse name: Not on file  . Number of children: Not on file  . Years of education: Not on file  . Highest education level: Not on file  Occupational History  . Not on file  Tobacco Use  . Smoking status: Current Some Day Smoker    Packs/day: 0.25  . Smokeless tobacco: Never Used  Vaping Use  . Vaping Use: Never used  Substance and Sexual Activity  . Alcohol use: Yes    Alcohol/week: 2.0 standard drinks    Types: 2 Cans of beer per week  . Drug use: Yes    Types: Marijuana, MDMA (Ecstacy)  . Sexual activity: Yes  Other Topics Concern  . Not on file  Social History Narrative  . Not on file   Social Determinants of Health   Financial Resource Strain: Low Risk   . Difficulty of Paying Living Expenses: Not hard at all  Food Insecurity: No Food Insecurity  . Worried About Programme researcher, broadcasting/film/video in the Last Year: Never true  . Ran Out of Food in the Last Year: Never true  Transportation Needs: No Transportation Needs  . Lack of Transportation (  Medical): No  . Lack of Transportation (Non-Medical): No  Physical Activity: Inactive  . Days of Exercise per Week: 0 days  . Minutes of Exercise per Session: 0 min  Stress: Stress Concern Present  . Feeling of Stress : Rather much  Social Connections: Moderately Isolated  . Frequency of Communication with Friends and Family: Once a week  . Frequency of Social Gatherings with Friends and Family: Once a week  . Attends Religious Services: More than 4 times per year  . Active Member of Clubs or Organizations: Yes  . Attends Banker Meetings: More  than 4 times per year  . Marital Status: Never married   Additional Social History:    Allergies:   Allergies  Allergen Reactions  . Haldol [Haloperidol Lactate] Other (See Comments)    Muscle stiffness  . Abilify [Aripiprazole]     eps  . Penicillins Hives    Felt like throat was closing Has patient had a PCN reaction causing immediate rash, facial/tongue/throat swelling, SOB or lightheadedness with hypotension: Unknown Has patient had a PCN reaction causing severe rash involving mucus membranes or skin necrosis: unknown Has patient had a PCN reaction that required hospitalization Unknown Has patient had a PCN reaction occurring within the last 10 years: Unknown If all of the above answers are "NO", then may proceed with Cephalosporin use.   . Zyprexa [Olanzapine]     eps    Labs:  Results for orders placed or performed during the hospital encounter of 09/29/20 (from the past 48 hour(s))  Comprehensive metabolic panel     Status: None   Collection Time: 09/29/20  3:45 PM  Result Value Ref Range   Sodium 137 135 - 145 mmol/L   Potassium 3.7 3.5 - 5.1 mmol/L   Chloride 102 98 - 111 mmol/L   CO2 27 22 - 32 mmol/L   Glucose, Bld 80 70 - 99 mg/dL    Comment: Glucose reference range applies only to samples taken after fasting for at least 8 hours.   BUN 12 6 - 20 mg/dL   Creatinine, Ser 1.61 0.61 - 1.24 mg/dL   Calcium 8.9 8.9 - 09.6 mg/dL   Total Protein 7.7 6.5 - 8.1 g/dL   Albumin 4.3 3.5 - 5.0 g/dL   AST 22 15 - 41 U/L   ALT 20 0 - 44 U/L   Alkaline Phosphatase 44 38 - 126 U/L   Total Bilirubin 1.0 0.3 - 1.2 mg/dL   GFR, Estimated >04 >54 mL/min    Comment: (NOTE) Calculated using the CKD-EPI Creatinine Equation (2021)    Anion gap 8 5 - 15    Comment: Performed at [facility], [address] [place], [place] [address]  Ethanol     Status: None   Collection Time: 09/29/20  3:45 PM  Result Value Ref Range   Alcohol, Ethyl (B) <10 <10 mg/dL    Comment:  (NOTE) Lowest detectable limit for serum alcohol is 10 mg/dL.  For medical purposes only. Performed at [facility], [address] [place], [place] [address]   Salicylate level     Status: Abnormal   Collection Time: 09/29/20  3:45 PM  Result Value Ref Range   Salicylate Lvl <7.0 (L) 7.0 - 30.0 mg/dL    Comment: Performed at [facility], [address], [place], [place] [address]  Acetaminophen level     Status: Abnormal   Collection Time: 09/29/20  3:45 PM  Result Value Ref Range  Acetaminophen (Tylenol), Serum <10 (L) 10 - 30 ug/mL    Comment: (NOTE) Therapeutic concentrations vary significantly. A range of 10-30 ug/mL  may be an effective concentration for many patients. However, some  are best treated at concentrations outside of this range. Acetaminophen concentrations >150 ug/mL at 4 hours after ingestion  and >50 ug/mL at 12 hours after ingestion are often associated with  toxic reactions.  Performed at [facility], [address] [place], [place] [address]   cbc     Status: None   Collection Time: 09/29/20  3:45 PM  Result Value Ref Range   WBC 6.0 4.0 - 10.5 K/uL   RBC 5.08 4.22 - 5.81 MIL/uL   Hemoglobin 15.2 13.0 - 17.0 g/dL   HCT 60.4 54.0 - 98.1 %   MCV 89.8 80.0 - 100.0 fL   MCH 29.9 26.0 - 34.0 pg   MCHC 33.3 30.0 - 36.0 g/dL   RDW 19.1 47.8 - 29.5 %   Platelets 211 150 - 400 K/uL   nRBC 0.0 0.0 - 0.2 %    Comment: Performed at [facility], [address] [place], [place] [address]  Valproic acid level     Status: None   Collection Time: 09/29/20  3:45 PM  Result Value Ref Range   Valproic Acid Lvl 55 50.0 - 100.0 ug/mL    Comment: Performed at [facility], [address] [place], [place] [address]  Resp Panel by RT-PCR (Flu A&B, Covid) Nasopharyngeal Swab     Status: None   Collection Time: 09/29/20  4:25 PM   Specimen: Nasopharyngeal Swab; Nasopharyngeal(NP) swabs in vial transport medium   Result Value Ref Range   SARS Coronavirus 2 by RT PCR NEGATIVE NEGATIVE    Comment: (NOTE) SARS-CoV-2 target nucleic acids are NOT DETECTED.  The SARS-CoV-2 RNA is generally detectable in upper respiratory specimens during the acute phase of infection. The lowest concentration of SARS-CoV-2 viral copies this assay can detect is 138 copies/mL. A negative result does not preclude SARS-Cov-2 infection and should not be used as the sole basis for treatment or other patient management decisions. A negative result may occur with  improper specimen collection/handling, submission of specimen other than nasopharyngeal swab, presence of viral mutation(s) within the areas targeted by this assay, and inadequate number of viral copies(<138 copies/mL). A negative result must be combined with clinical observations, patient history, and epidemiological information. The expected result is Negative.  Fact Sheet for Patients:  BloggerCourse.com  Fact Sheet for Healthcare Providers:  SeriousBroker.it  This test is no t yet approved or cleared by the [place] FDA and  has been authorized for detection and/or diagnosis of SARS-CoV-2 by FDA under an Emergency Use Authorization (EUA). This EUA will remain  in effect (meaning this test can be used) for the duration of the COVID-19 declaration under Section 564(b)(1) of the Act, 21 U.S.C.section 360bbb-3(b)(1), unless the authorization is terminated  or revoked sooner.       Influenza A by PCR NEGATIVE NEGATIVE   Influenza B by PCR NEGATIVE NEGATIVE    Comment: (NOTE) The Xpert Xpress SARS-CoV-2/FLU/RSV plus assay is intended as an aid in the diagnosis of influenza from Nasopharyngeal swab specimens and should not be used as a sole basis for treatment. Nasal washings and aspirates are unacceptable for Xpert Xpress SARS-CoV-2/FLU/RSV testing.  Fact Sheet for  Patients: BloggerCourse.com  Fact Sheet for Healthcare Providers: SeriousBroker.it  This test is not yet approved or cleared by the  [place] Fut[employer] trader and has been authorized for detection and/or diagnosis of SARS-CoV-2 by FDA under an TEFL teacher (EUA). This EUA will remain in effect (meaning this test can be used) for the duration of the COVID-19 declaration under Section 564(b)(1) of the Act, 21 U.S.C. section 360bbb-3(b)(1), unless the authorization is terminated or revoked.  Performed at [facility], [address] [place], [place] [address]   Urine Drug Screen, Qualitative     Status: None   Collection Time: 09/29/20  6:17 PM  Result Value Ref Range   Tricyclic, Ur Screen NONE DETECTED NONE DETECTED   Amphetamines, Ur Screen NONE DETECTED NONE DETECTED   MDMA (Ecstasy)Ur Screen NONE DETECTED NONE DETECTED   Cocaine Metabolite,Ur [place] NONE DETECTED NONE DETECTED   Opiate, Ur Screen NONE DETECTED NONE DETECTED   Phencyclidine (PCP) Ur S NONE DETECTED NONE DETECTED   Cannabinoid 50 Ng, Ur  NONE DETECTED NONE DETECTED   Barbiturates, Ur Screen NONE DETECTED NONE DETECTED   Benzodiazepine, Ur Scrn NONE DETECTED NONE DETECTED   Methadone Scn, Ur NONE DETECTED NONE DETECTED    Comment: (NOTE) Tricyclics + metabolites, urine    Cutoff 1000 ng/mL Amphetamines + metabolites, urine  Cutoff 1000 ng/mL MDMA (Ecstasy), urine              Cutoff 500 ng/mL Cocaine Metabolite, urine          Cutoff 300 ng/mL Opiate + metabolites, urine        Cutoff 300 ng/mL Phencyclidine (PCP), urine         Cutoff 25 ng/mL Cannabinoid, urine                 Cutoff 50 ng/mL Barbiturates + metabolites, urine  Cutoff 200 ng/mL Benzodiazepine, urine              Cutoff 200 ng/mL Methadone, urine                   Cutoff 300 ng/mL  The urine drug screen provides only a preliminary, unconfirmed analytical test result and should  not be used for non-medical purposes. Clinical consideration and professional judgment should be applied to any positive drug screen result due to possible interfering substances. A more specific alternate chemical method must be used in order to obtain a confirmed analytical result. Gas chromatography / mass spectrometry (GC/MS) is the preferred confirm atory method. Performed at [facility], [address] [place], [place] [address]     Current Facility-Administered Medications  Medication Dose Route Frequency Provider Last Rate Last Admin  . divalproex (DEPAKOTE) DR tablet 500 mg  500 mg Oral Q12H Demetrious Rainford, Jackquline Denmark, MD   500 mg at 10/01/20 0951  . QUEtiapine (SEROQUEL) tablet 600 mg  600 mg Oral QHS Carine Nordgren, Jackquline Denmark, MD   600 mg at 09/30/20 2111  . traZODone (DESYREL) tablet 50 mg  50 mg Oral QHS Zaylin Runco, Jackquline Denmark, MD   50 mg at 09/30/20 2112   Current Outpatient Medications  Medication Sig Dispense Refill  . divalproex (DEPAKOTE) 500 MG DR tablet Take 1 tablet (500 mg total) by mouth every 12 (twelve) hours. 60 tablet 1  . QUEtiapine (SEROQUEL) 300 MG tablet Take 2 tablets (600 mg total) by mouth at bedtime. 60 tablet 1  . traZODone (DESYREL) 50 MG tablet Take 1 tablet (50 mg total) by mouth at bedtime as needed for sleep. 30 tablet 1    Musculoskeletal: Strength & Muscle Tone: within normal limits  Gait & Station: normal Patient leans: N/A            Psychiatric Specialty Exam:  Presentation  General Appearance: No data recorded Eye Contact:No data recorded Speech:No data recorded Speech Volume:No data recorded Handedness:No data recorded  Mood and Affect  Mood:No data recorded Affect:No data recorded  Thought Process  Thought Processes:No data recorded Descriptions of Associations:No data recorded Orientation:No data recorded Thought Content:No data recorded History of Schizophrenia/Schizoaffective disorder:Yes  Duration of Psychotic  Symptoms:Greater than six months  Hallucinations:No data recorded Ideas of Reference:No data recorded Suicidal Thoughts:No data recorded Homicidal Thoughts:No data recorded  Sensorium  Memory:No data recorded Judgment:No data recorded Insight:No data recorded  Executive Functions  Concentration:No data recorded Attention Span:No data recorded Recall:No data recorded Fund of Knowledge:No data recorded Language:No data recorded  Psychomotor Activity  Psychomotor Activity:No data recorded  Assets  Assets:No data recorded  Sleep  Sleep:No data recorded  Physical Exam: Physical Exam Vitals and nursing note reviewed.  Constitutional:      Appearance: Normal appearance.  HENT:     Head: Normocephalic and atraumatic.     Mouth/Throat:     Pharynx: Oropharynx is clear.  Eyes:     Pupils: Pupils are equal, round, and reactive to light.  Cardiovascular:     Rate and Rhythm: Normal rate and regular rhythm.  Pulmonary:     Effort: Pulmonary effort is normal.     Breath sounds: Normal breath sounds.  Abdominal:     General: Abdomen is flat.     Palpations: Abdomen is soft.  Musculoskeletal:        General: Normal range of motion.  Skin:    General: Skin is warm and dry.  Neurological:     General: No focal deficit present.     Mental Status: He is alert. Mental status is at baseline.  Psychiatric:        Attention and Perception: Attention normal. He perceives auditory hallucinations.        Mood and Affect: Mood is depressed. Affect is blunt.        Speech: Speech is delayed.        Behavior: Behavior is slowed.        Thought Content: Thought content is paranoid. Thought content includes suicidal ideation. Thought content does not include suicidal plan.        Cognition and Memory: Cognition normal.        Judgment: Judgment normal.    Review of Systems  Constitutional: Negative.   HENT: Negative.   Eyes: Negative.   Respiratory: Negative.   Cardiovascular:  Negative.   Gastrointestinal: Negative.   Musculoskeletal: Negative.   Skin: Negative.   Neurological: Negative.   Psychiatric/Behavioral: Positive for depression, hallucinations and suicidal ideas. Negative for memory loss and substance abuse. The patient is not nervous/anxious and does not have insomnia.    Blood pressure 110/70, pulse 77, temperature 98.1 F (36.7 C), temperature source Oral, resp. rate 18, height 6\' 4"  (1.93 m), weight 113.4 kg, SpO2 100 %. Body mass index is 30.43 kg/m.  Treatment Plan Summary: Plan 25 year old man with schizoaffective disorder depressive type remains flat withdrawn depressed and voicing some suicidal ideation and hallucinations.  Continues to be appropriate for inpatient hospitalization.  Bed is available today and I have suggested he be considered first in line for admission to our psychiatric unit.  Will review medications and try to make sure things make sense and make sure we have orders for full labs that  are in place if not now when he gets downstairs.  Patient understands plan and is agreeable.  Case reviewed with ER physician and TTS.  Disposition: Recommend psychiatric Inpatient admission when medically cleared. Supportive therapy provided about ongoing stressors.  Mordecai Rasmussen, MD 10/01/2020 10:12 AM

## 2020-10-01 NOTE — Tx Team (Signed)
Initial Treatment Plan 10/01/2020 4:26 PM [name] TYO:[id number]   PATIENT STRESSORS: Medication change or noncompliance Other: Taking care of his uncle   PATIENT STRENGTHS: Motivation for treatment/growth Physical Health   PATIENT IDENTIFIED PROBLEMS: Depression  Self-harm                   DISCHARGE CRITERIA:  Reduction of life-threatening or endangering symptoms to within safe limits  PRELIMINARY DISCHARGE PLAN: Return to previous living arrangement  PATIENT/FAMILY INVOLVEMENT: This treatment plan has been presented to and reviewed with the patient, Timothy [name]. The patient has been given the opportunity to ask questions and make suggestions.  Celene Kras, RN 10/01/2020, 4:26 PM

## 2020-10-01 NOTE — ED Notes (Signed)
Pt discharged to BMU.  VS stable. Belongings sent with patient (I bag).  Pt cooperative.

## 2020-10-01 NOTE — ED Notes (Signed)
Pt asleep at this time, unable to collect vitals. Will collect pt vitals once awake. 

## 2020-10-01 NOTE — ED Provider Notes (Signed)
Emergency Medicine Observation Re-evaluation Note  Timothy [name] is a 25 y.o. male, seen on rounds today.  Pt initially presented to the ED for complaints of Psychiatric Evaluation  Currently, the patient is is no acute distress. Denies any concerns at this time. Sitting in bed  Physical Exam  Blood pressure 110/70, pulse 77, temperature 98.1 F (36.7 C), temperature source Oral, resp. rate 18, height 6\' 4"  (1.93 m), weight 113.4 kg, SpO2 100 %.  Physical Exam General: No apparent distress HEENT: moist mucous membranes CV: RRR Pulm: Normal WOB GI: soft and non tender MSK: no edema or cyanosis Neuro: face symmetric, moving all extremities     ED Course / MDM     I have reviewed the labs performed to date as well as medications administered while in observation.  Recent changes in the last 24 hours include none   Plan   Current plan is to continue to wait for psych plan/placement if felt warranted  Patient is not under full IVC at this time.    , MD 10/01/20 [phone]

## 2020-10-02 DIAGNOSIS — F251 Schizoaffective disorder, depressive type: Principal | ICD-10-CM

## 2020-10-02 LAB — LIPID PANEL
Cholesterol: 147 mg/dL (ref 0–200)
HDL: 43 mg/dL (ref >40)
LDL Cholesterol: 96 mg/dL (ref 0–99)
Total CHOL/HDL Ratio: 3.4 RATIO
Triglycerides: 39 mg/dL (ref <150)
VLDL: 8 mg/dL (ref 0–40)

## 2020-10-02 LAB — HEMOGLOBIN A1C
Hgb A1c MFr Bld: 5.6 % (ref 4.8–5.6)
Mean Plasma Glucose: 114.02 mg/dL

## 2020-10-02 MED ORDER — QUETIAPINE FUMARATE 200 MG PO TABS
300.0000 mg | ORAL_TABLET | Freq: Every day | ORAL | Status: DC
  Administered 2020-10-02: 300 mg via ORAL
  Filled 2020-10-02: qty 1

## 2020-10-02 MED ORDER — BENZTROPINE MESYLATE 1 MG PO TABS
1.0000 mg | ORAL_TABLET | Freq: Two times a day (BID) | ORAL | Status: DC
  Administered 2020-10-02 – 2020-10-03 (×2): 1 mg via ORAL
  Filled 2020-10-02 (×2): qty 1

## 2020-10-02 NOTE — [facility] Group Notes (Signed)
LCSW Group Therapy Note     10/02/2020 1:52 PM     Type of Therapy/Topic:  Group Therapy:  Balance in Life     Participation Level:  Did Not Attend     Description of Group:    This group will address the concept of balance and how it feels and looks when one is unbalanced. Patients will be encouraged to process areas in their lives that are out of balance and identify reasons for remaining unbalanced. Facilitators will guide patients in utilizing problem-solving interventions to address and correct the stressor making their life unbalanced. Understanding and applying boundaries will be explored and addressed for obtaining and maintaining a balanced life. Patients will be encouraged to explore ways to assertively make their unbalanced needs known to significant others in their lives, using other group members and facilitator for support and feedback.     Therapeutic Goals:  1.      Patient will identify two or more emotions or situations they have that consume much of in their lives.  2.      Patient will identify signs/triggers that life has become out of balance:  3.      Patient will identify two ways to set boundaries in order to achieve balance in their lives:  4.      Patient will demonstrate ability to communicate their needs through discussion and/or role plays     Summary of Patient Progress:  X    Therapeutic Modalities:   Cognitive Behavioral Therapy  Solution-Focused Therapy  Assertiveness Training     Rector Devonshire [place] MSW, LCSW-A  10/02/2020 1:52 PM

## 2020-10-02 NOTE — Progress Notes (Signed)
Pt is alert and oriented to person, place, time and situation. Pt denies suicidal and homicidal ideation, denies hallucinations. Pt has been isolating in his room. Pt is medication compliant. Will continue to monitor pt per Q15 minute face checks, and monitor for safety and progress.

## 2020-10-02 NOTE — Plan of Care (Signed)
  Problem: Education: Goal: Mental status will improve Outcome: Progressing   Problem: Coping: Goal: Ability to interact with others will improve Outcome: Progressing

## 2020-10-02 NOTE — Progress Notes (Signed)
[facility] MD Progress Note  10/02/2020 12:47 PM Timothy [name]  MRN:  [id number]   CC "I had some weird dreams."  Subjective:  25 year old male with schizoaffective disorder who presented with suicidal ideations with plan to cut himself. No acute events overnight, medication compliant, attending to ADLs.  Patient seen one-on-one this morning. He notes he woke up several times last night due to weird dreams, but cannot recall the content. He felt he was able to fall back asleep each time though, and documented 8 hours of sleep. He continues to feel somewhat fatigued today, but less so yesterday. He continues to have some passive suicidal ideations, but contracts for safety. He denies current auditory hallucinations on time of assessment, but notes he still hears intermittent voices telling him to hurt himself. He also endorses some visual hallucinations of shadows when the lights are off. He denies homicidal ideations. He has felt his arm twitch once today, but other than that denies any side effects to medications. Will continue to monitor closely given history of EPS side effects to antipsychotics in the past. He continues to express some paranoia about people being out to get him and harm him, but he is unable to articulate this well, or identify why he feel this way.   Principal Problem: Schizoaffective disorder, depressive type (HCC) Diagnosis: Principal Problem:   Schizoaffective disorder, depressive type (HCC) Active Problems:   Cannabis use disorder, moderate, dependence (HCC)   Tobacco use disorder   Benzodiazepine abuse (HCC)  Total Time spent with patient: 30 minutes  Past Psychiatric History: See H&P  Past Medical History:  Past Medical History:  Diagnosis Date  . Cannabis abuse   . Paranoid schizophrenia (HCC)   . Schizoaffective disorder, depressive type (HCC) 09/17/2014   History reviewed. No pertinent surgical history. Family History:  Family History  Problem Relation Age of  Onset  . Mental illness Brother   . Drug abuse Brother   . Mental illness Cousin   . Suicidality Cousin   . Diabetes Maternal Grandmother    Family Psychiatric  History: See H&P Social History:  Social History   Substance and Sexual Activity  Alcohol Use Yes  . Alcohol/week: 2.0 standard drinks  . Types: 2 Cans of beer per week     Social History   Substance and Sexual Activity  Drug Use Yes  . Types: Marijuana, MDMA (Ecstacy)    Social History   Socioeconomic History  . Marital status: Single    Spouse name: Not on file  . Number of children: Not on file  . Years of education: Not on file  . Highest education level: Not on file  Occupational History  . Not on file  Tobacco Use  . Smoking status: Current Some Day Smoker    Packs/day: 0.25  . Smokeless tobacco: Never Used  Vaping Use  . Vaping Use: Never used  Substance and Sexual Activity  . Alcohol use: Yes    Alcohol/week: 2.0 standard drinks    Types: 2 Cans of beer per week  . Drug use: Yes    Types: Marijuana, MDMA (Ecstacy)  . Sexual activity: Yes  Other Topics Concern  . Not on file  Social History Narrative  . Not on file   Social Determinants of Health   Financial Resource Strain: Low Risk   . Difficulty of Paying Living Expenses: Not hard at all  Food Insecurity: No Food Insecurity  . Worried About Programme researcher, broadcasting/film/video in the Last Year:  Never true  . Ran Out of Food in the Last Year: Never true  Transportation Needs: No Transportation Needs  . Lack of Transportation (Medical): No  . Lack of Transportation (Non-Medical): No  Physical Activity: Inactive  . Days of Exercise per Week: 0 days  . Minutes of Exercise per Session: 0 min  Stress: Stress Concern Present  . Feeling of Stress : Rather much  Social Connections: Moderately Isolated  . Frequency of Communication with Friends and Family: Once a week  . Frequency of Social Gatherings with Friends and Family: Once a week  . Attends Religious  Services: More than 4 times per year  . Active Member of Clubs or Organizations: Yes  . Attends Banker Meetings: More than 4 times per year  . Marital Status: Never married   Additional Social History:                         Sleep: Fair  Appetite:  Fair  Current Medications: Current Facility-Administered Medications  Medication Dose Route Frequency Provider Last Rate Last Admin  . acetaminophen (TYLENOL) tablet 650 mg  650 mg Oral Q6H PRN Clapacs, John T, MD      . alum & mag hydroxide-simeth (MAALOX/MYLANTA) 200-200-20 MG/5ML suspension 30 mL  30 mL Oral Q4H PRN Clapacs, John T, MD      . ARIPiprazole (ABILIFY) tablet 10 mg  10 mg Oral Daily Jesse Sans, MD   10 mg at 10/02/20 1241  . benztropine (COGENTIN) tablet 1 mg  1 mg Oral BID Jesse Sans, MD      . divalproex (DEPAKOTE) DR tablet 500 mg  500 mg Oral Q12H Clapacs, Jackquline Denmark, MD   500 mg at 10/02/20 1241  . magnesium hydroxide (MILK OF MAGNESIA) suspension 30 mL  30 mL Oral Daily PRN Clapacs, John T, MD      . QUEtiapine (SEROQUEL) tablet 300 mg  300 mg Oral QHS Jesse Sans, MD      . traZODone (DESYREL) tablet 50 mg  50 mg Oral QHS PRN Jesse Sans, MD        Lab Results:  Results for orders placed or performed during the hospital encounter of 10/01/20 (from the past 48 hour(s))  Hemoglobin A1c     Status: None   Collection Time: 10/02/20  6:28 AM  Result Value Ref Range   Hgb A1c MFr Bld 5.6 4.8 - 5.6 %    Comment: (NOTE) Pre diabetes:          5.7%-6.4%  Diabetes:              >6.4%  Glycemic control for   <7.0% adults with diabetes    Mean Plasma Glucose 114.02 mg/dL    Comment: Performed at [facility], [address], [place], [place] [address]  Lipid panel     Status: None   Collection Time: 10/02/20  6:28 AM  Result Value Ref Range   Cholesterol 147 0 - 200 mg/dL   Triglycerides 39 <824 mg/dL   HDL 43 >23 mg/dL   Total CHOL/HDL Ratio 3.4 RATIO   VLDL 8 0 -  40 mg/dL   LDL Cholesterol 96 0 - 99 mg/dL    Comment:        Total Cholesterol/HDL:CHD Risk Coronary Heart Disease Risk Table                     Men  Women  1/2 Average Risk   3.4   3.3  Average Risk       5.0   4.4  2 X Average Risk   9.6   7.1  3 X Average Risk  23.4   11.0        Use the calculated Patient Ratio above and the CHD Risk Table to determine the patient's CHD Risk.        ATP III CLASSIFICATION (LDL):  <100     mg/dL   Optimal  583-094  mg/dL   Near or Above                    Optimal  130-159  mg/dL   Borderline  076-808  mg/dL   High  >811     mg/dL   Very High Performed at [facility], [address] [place], [place] [address]     Blood Alcohol level:  Lab Results  Component Value Date   [facility] <10 09/29/2020   ETH <10 08/19/2020    Metabolic Disorder Labs: Lab Results  Component Value Date   HGBA1C 5.6 10/02/2020   MPG 114.02 10/02/2020   MPG 108 08/21/2020   Lab Results  Component Value Date   PROLACTIN 33.2 (H) 08/15/2016   Lab Results  Component Value Date   CHOL 147 10/02/2020   TRIG 39 10/02/2020   HDL 43 10/02/2020   CHOLHDL 3.4 10/02/2020   VLDL 8 10/02/2020   LDLCALC 96 10/02/2020   LDLCALC 107 (H) 08/21/2020    Physical Findings: AIMS:  , ,  ,  ,    CIWA:    COWS:     Musculoskeletal: Strength & Muscle Tone: within normal limits Gait & Station: normal Patient leans: N/A  Psychiatric Specialty Exam:  Presentation  General Appearance: Fairly Groomed  Eye Contact:Fair  Speech:Normal Rate  Speech Volume:Normal  Handedness:Right   Mood and Affect  Mood:Depressed  Affect:Congruent; Constricted   Thought Process  Thought Processes:Coherent  Descriptions of Associations:Intact  Orientation:Full (Time, Place and Person)  Thought Content:Paranoid Ideation  History of Schizophrenia/Schizoaffective disorder:Yes  Duration of Psychotic Symptoms:Greater than six  months  Hallucinations:Hallucinations: Auditory  Ideas of Reference:Paranoia  Suicidal Thoughts:Suicidal Thoughts: Yes, Passive SI Passive Intent and/or Plan: Without Intent; Without Plan  Homicidal Thoughts:Homicidal Thoughts: No   Sensorium  Memory:Immediate Fair; Recent Fair; Remote Fair  Judgment:Impaired  Insight:Present   Executive Functions  Concentration:Fair  Attention Span:Fair  Recall:Fair  Fund of Knowledge:Fair  Language:Fair   Psychomotor Activity  Psychomotor Activity:Psychomotor Activity: Decreased   Assets  Assets:Communication Skills; Desire for Improvement; Physical Health; Resilience   Sleep  Sleep:Sleep: Fair Number of Hours of Sleep: 8.5    Physical Exam: Physical Exam ROS Blood pressure (!) 130/55, pulse 67, temperature 98.2 F (36.8 C), temperature source Oral, resp. rate 18, height 6\' 4"  (1.93 m), weight 97.1 kg, SpO2 100 %. Body mass index is 26.05 kg/m.   Treatment Plan Summary: Daily contact with patient to assess and evaluate symptoms and progress in treatment and Medication management 25 year old male with schizoaffective disorder, depressive type presenting with suicidal ideations. Continue Depakote 500 mg BID, decrease seroquel 300 mg QHS with plan to titrate off and discontinue, continue Abilify 10 mg daily with cogentin 1 mg BID for EPS prevention. Titrate to effect. Patient denied any drug use since leaving the hospital in February. UDS negative on admission. Continue to encourage abstinence from illicit substances.   March, MD 10/02/2020, 12:47  PM

## 2020-10-02 NOTE — Progress Notes (Signed)
Recreation Therapy Notes  Date: 10/02/2020   Time: 10:00 am   Location: Craft room     Behavioral response: N/A   Intervention Topic: Leisure    Discussion/Intervention: Patient did not attend group.   Clinical Observations/Feedback:  Patient did not attend group.   Amadi Yoshino LRT/CTRS        Timothy [name] 10/02/2020 10:16 AM 

## 2020-10-02 NOTE — Progress Notes (Signed)
Patient has been isolative to his room. He did not come for snack and had to be prompted to come to medication room for QHS medication. He reports that he has been in his room sleeping since being admitted to the unit.  He denies SI HI AVH anxiety and pain.  He does endorse depression and received QHS medication to help with symptoms. He remains safe on the unit and was encouraged to come to staff with ant concerns.      Cleo Butler-Nicholson, LPN
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
## 2020-10-03 MED ORDER — FLUOXETINE HCL 20 MG PO CAPS
20.0000 mg | ORAL_CAPSULE | Freq: Every day | ORAL | Status: DC
  Administered 2020-10-03 – 2020-10-06 (×4): 20 mg via ORAL
  Filled 2020-10-03 (×4): qty 1

## 2020-10-03 MED ORDER — QUETIAPINE FUMARATE 200 MG PO TABS
400.0000 mg | ORAL_TABLET | Freq: Every day | ORAL | Status: DC
  Administered 2020-10-03 – 2020-10-07 (×5): 400 mg via ORAL
  Filled 2020-10-03 (×5): qty 2

## 2020-10-03 NOTE — Progress Notes (Signed)
[facility] MD Progress Note  10/03/2020 12:07 PM Timothy [name]  MRN:  [id number]   CC "Feeling depressed today"  Subjective:  25 year old male with schizoaffective disorder who presented with suicidal ideations with plan to cut himself. No acute events overnight, medication compliant, attending to ADLs.  Patient seen during treatment team and again one-on-one this morning. He notes that he has been feeling increasingly depressed, and having more frequent suicidal thoughts today. He has also reported worsening sleep and increased nightmares since decreasing Seroquel and starting Abilify. He wishes to return to past regimen. However, on admission he was also feeling depressed and suicidal with Seroquel and Depakote. He is agreeable to adding Prozac to assist with depression. He denies any homicidal ideations, or visual hallucinations. He continues to hear occasional voices telling him to hurt himself. He is able to contract for safety at time of interview. He was encouraged to speak to staff and come out to day room if suicidal thoughts return.    Principal Problem: Schizoaffective disorder, depressive type (HCC) Diagnosis: Principal Problem:   Schizoaffective disorder, depressive type (HCC) Active Problems:   Cannabis use disorder, moderate, dependence (HCC)   Tobacco use disorder   Benzodiazepine abuse (HCC)  Total Time spent with patient: 30 minutes  Past Psychiatric History: See H&P  Past Medical History:  Past Medical History:  Diagnosis Date  . Cannabis abuse   . Paranoid schizophrenia (HCC)   . Schizoaffective disorder, depressive type (HCC) 09/17/2014   History reviewed. No pertinent surgical history. Family History:  Family History  Problem Relation Age of Onset  . Mental illness Brother   . Drug abuse Brother   . Mental illness Cousin   . Suicidality Cousin   . Diabetes Maternal Grandmother    Family Psychiatric  History: See H&P Social History:  Social History   Substance  and Sexual Activity  Alcohol Use Yes  . Alcohol/week: 2.0 standard drinks  . Types: 2 Cans of beer per week     Social History   Substance and Sexual Activity  Drug Use Yes  . Types: Marijuana, MDMA (Ecstacy)    Social History   Socioeconomic History  . Marital status: Single    Spouse name: Not on file  . Number of children: Not on file  . Years of education: Not on file  . Highest education level: Not on file  Occupational History  . Not on file  Tobacco Use  . Smoking status: Current Some Day Smoker    Packs/day: 0.25  . Smokeless tobacco: Never Used  Vaping Use  . Vaping Use: Never used  Substance and Sexual Activity  . Alcohol use: Yes    Alcohol/week: 2.0 standard drinks    Types: 2 Cans of beer per week  . Drug use: Yes    Types: Marijuana, MDMA (Ecstacy)  . Sexual activity: Yes  Other Topics Concern  . Not on file  Social History Narrative  . Not on file   Social Determinants of Health   Financial Resource Strain: Low Risk   . Difficulty of Paying Living Expenses: Not hard at all  Food Insecurity: No Food Insecurity  . Worried About Programme researcher, broadcasting/film/video in the Last Year: Never true  . Ran Out of Food in the Last Year: Never true  Transportation Needs: No Transportation Needs  . Lack of Transportation (Medical): No  . Lack of Transportation (Non-Medical): No  Physical Activity: Inactive  . Days of Exercise per Week: 0 days  .  Minutes of Exercise per Session: 0 min  Stress: Stress Concern Present  . Feeling of Stress : Rather much  Social Connections: Moderately Isolated  . Frequency of Communication with Friends and Family: Once a week  . Frequency of Social Gatherings with Friends and Family: Once a week  . Attends Religious Services: More than 4 times per year  . Active Member of Clubs or Organizations: Yes  . Attends Banker Meetings: More than 4 times per year  . Marital Status: Never married   Additional Social History:                          Sleep: Fair  Appetite:  Fair  Current Medications: Current Facility-Administered Medications  Medication Dose Route Frequency Provider Last Rate Last Admin  . acetaminophen (TYLENOL) tablet 650 mg  650 mg Oral Q6H PRN Clapacs, John T, MD      . alum & mag hydroxide-simeth (MAALOX/MYLANTA) 200-200-20 MG/5ML suspension 30 mL  30 mL Oral Q4H PRN Clapacs, John T, MD      . divalproex (DEPAKOTE) DR tablet 500 mg  500 mg Oral Q12H Clapacs, Jackquline Denmark, MD   500 mg at 10/03/20 0831  . FLUoxetine (PROZAC) capsule 20 mg  20 mg Oral Daily Jesse Sans, MD      . magnesium hydroxide (MILK OF MAGNESIA) suspension 30 mL  30 mL Oral Daily PRN Clapacs, John T, MD      . QUEtiapine (SEROQUEL) tablet 400 mg  400 mg Oral QHS Jesse Sans, MD      . traZODone (DESYREL) tablet 50 mg  50 mg Oral QHS PRN Jesse Sans, MD   50 mg at 10/02/20 2202    Lab Results:  Results for orders placed or performed during the hospital encounter of 10/01/20 (from the past 48 hour(s))  Hemoglobin A1c     Status: None   Collection Time: 10/02/20  6:28 AM  Result Value Ref Range   Hgb A1c MFr Bld 5.6 4.8 - 5.6 %    Comment: (NOTE) Pre diabetes:          5.7%-6.4%  Diabetes:              >6.4%  Glycemic control for   <7.0% adults with diabetes    Mean Plasma Glucose 114.02 mg/dL    Comment: Performed at [facility], [address] [place], [place] [address]  Lipid panel     Status: None   Collection Time: 10/02/20  6:28 AM  Result Value Ref Range   Cholesterol 147 0 - 200 mg/dL   Triglycerides 39 <194 mg/dL   HDL 43 >17 mg/dL   Total CHOL/HDL Ratio 3.4 RATIO   VLDL 8 0 - 40 mg/dL   LDL Cholesterol 96 0 - 99 mg/dL    Comment:        Total Cholesterol/HDL:CHD Risk Coronary Heart Disease Risk Table                     Men   Women  1/2 Average Risk   3.4   3.3  Average Risk       5.0   4.4  2 X Average Risk   9.6   7.1  3 X Average Risk  23.4   11.0        Use the  calculated Patient Ratio above and the CHD Risk Table to determine the patient's  CHD Risk.        ATP III CLASSIFICATION (LDL):  <100     mg/dL   Optimal  503-888  mg/dL   Near or Above                    Optimal  130-159  mg/dL   Borderline  280-034  mg/dL   High  >917     mg/dL   Very High Performed at [facility], [address] [place], [place] [address]     Blood Alcohol level:  Lab Results  Component Value Date   [facility] <10 09/29/2020   ETH <10 08/19/2020    Metabolic Disorder Labs: Lab Results  Component Value Date   HGBA1C 5.6 10/02/2020   MPG 114.02 10/02/2020   MPG 108 08/21/2020   Lab Results  Component Value Date   PROLACTIN 33.2 (H) 08/15/2016   Lab Results  Component Value Date   CHOL 147 10/02/2020   TRIG 39 10/02/2020   HDL 43 10/02/2020   CHOLHDL 3.4 10/02/2020   VLDL 8 10/02/2020   LDLCALC 96 10/02/2020   LDLCALC 107 (H) 08/21/2020    Physical Findings: AIMS:  , ,  ,  ,    CIWA:    COWS:     Musculoskeletal: Strength & Muscle Tone: within normal limits Gait & Station: normal Patient leans: N/A  Psychiatric Specialty Exam:  Presentation  General Appearance: Fairly Groomed  Eye Contact:Fair  Speech:Normal Rate  Speech Volume:Normal  Handedness:Right   Mood and Affect  Mood:Depressed  Affect:Congruent; Constricted   Thought Process  Thought Processes:Coherent  Descriptions of Associations:Intact  Orientation:Full (Time, Place and Person)  Thought Content:Paranoid Ideation  History of Schizophrenia/Schizoaffective disorder:Yes  Duration of Psychotic Symptoms:Greater than six months  Hallucinations:Hallucinations: Auditory  Ideas of Reference:Paranoia  Suicidal Thoughts:Suicidal Thoughts: Yes, Passive SI Passive Intent and/or Plan: Without Intent; Without Plan  Homicidal Thoughts:Homicidal Thoughts: No   Sensorium  Memory:Immediate Fair; Recent Fair; Remote  Fair  Judgment:Impaired  Insight:Present   Executive Functions  Concentration:Fair  Attention Span:Fair  Recall:Fair  Fund of Knowledge:Fair  Language:Fair   Psychomotor Activity  Psychomotor Activity:Psychomotor Activity: Decreased   Assets  Assets:Communication Skills; Desire for Improvement; Physical Health; Resilience   Sleep  Sleep:Sleep: Fair Number of Hours of Sleep: 8.5    Physical Exam: Physical Exam  ROS  Blood pressure 121/74, pulse 65, temperature 98.1 F (36.7 C), temperature source Oral, resp. rate 18, height 6\' 4"  (1.93 m), weight 97.1 kg, SpO2 98 %. Body mass index is 26.05 kg/m.   Treatment Plan Summary: Daily contact with patient to assess and evaluate symptoms and progress in treatment and Medication management 25 year old male with schizoaffective disorder, depressive type presenting with suicidal ideations. Today patient reported worsening depression and more frequent suicidal ideations since decreasing seroquel and starting Abilify. Continue Depakote 500 mg BID, increase seroquel 400 mg QHS. Discontinue Abilify and cogentin. Start Prozac 20 mg daily. Patient denied any drug use since leaving the hospital in February. UDS negative on admission. Continue to encourage abstinence from illicit substances.   10/03/20: Psychiatric exam above reviewed and remains accurate. Assessment and plan above reviewed and updated.    12/03/20, MD 10/03/2020, 12:07 PM

## 2020-10-03 NOTE — Progress Notes (Signed)
Recreation Therapy Notes  INPATIENT RECREATION THERAPY ASSESSMENT  Patient Details Name: Timothy [name] MRN: [id number] DOB: [birth year] Today's Date: 10/03/2020       Information Obtained From: Patient  Able to Participate in Assessment/Interview: Yes  Patient Presentation: Responsive  Reason for Admission (Per Patient): Active Symptoms  Patient Stressors:    Coping Skills:   Surveyor, minerals (2+):  Music - Listen,Social - Friends Administrator, Civil Service)  Frequency of Recreation/Participation: Monthly  Awareness of Community Resources:  Yes  Community Resources:     Current Use: No  If no, Barriers?: Attitudinal,Financial,Transportation  Expressed Interest in [employer] Information: No  [employer] of Residence:  Film/video editor  Patient Main Form of Transportation: Other (Comment) (Family)  Patient Strengths:  good listener, Fast learner  Patient Identified Areas of Improvement:  Stay in my own lane; Talk to others more  Patient Goal for Hospitalization:  Finding a place to go and get some therapy  Current SI (including self-harm):  Yes (No plan)  Current HI:  No  Current AVH: No  Staff Intervention Plan: Group Attendance,Collaborate with Interdisciplinary Treatment Team  Consent to Intern Participation: N/A  Lolitha Tortora 10/03/2020, 12:21 PM

## 2020-10-03 NOTE — Progress Notes (Signed)
Recreation Therapy Notes  Date: 10/03/2020  Time: 9:30 am   Location: Court yard    Behavioral response: Appropriate  Intervention Topic: Social Skills    Discussion/Intervention:  Group content on today was focused on Pharmacist, community. The group defined social skills and identified ways they use social skills. Patients expressed what obstacles they face when trying to be social. Participants described the importance of social skills. The group listed ways to improve social skills and reasons to improve social skills. Individuals had an opportunity to learn new and improve social skills as well as identify their weaknesses. Clinical Observations/Feedback: Patient came to group and was focused on what peers and staff had to say. He expressed that he often isolates but he can he social at times. Individual was social with staff and peers while participating in the intervention. Shrihan Putt LRT/CTRS         Vetta Couzens 10/03/2020 11:29 AM

## 2020-10-03 NOTE — [facility] Suicide Risk Assessment (Signed)
[facility] INPATIENT:  Family/Significant Other Suicide Prevention Education  Suicide Prevention Education:  Contact Attempts: Timothy [name]/mother [phone]), has been identified by the patient as the family member/significant other with whom the patient will be residing, and identified as the person(s) who will aid the patient in the event of a mental health crisis.  With written consent from the patient, two attempts were made to provide suicide prevention education, prior to and/or following the patient's discharge.  We were unsuccessful in providing suicide prevention education.  A suicide education pamphlet was given to the patient to share with family/significant other.  Date and time of first attempt: 10/03/20 at 2:38PM Date and time of second attempt: second attempt needed  Timothy [name] 10/03/2020, 2:39 PM

## 2020-10-03 NOTE — [facility] Group Notes (Signed)
LCSW Group Therapy Note  10/03/2020 2:17 PM  Type of Therapy and Topic:  Group Therapy:  Feelings around Relapse and Recovery  Participation Level:  Active   Description of Group:    Patients in this group will discuss emotions they experience before and after a relapse. They will process how experiencing these feelings, or avoidance of experiencing them, relates to having a relapse. Facilitator will guide patients to explore emotions they have related to recovery. Patients will be encouraged to process which emotions are more powerful. They will be guided to discuss the emotional reaction significant others in their lives may have to their relapse or recovery. Patients will be assisted in exploring ways to respond to the emotions of others without this contributing to a relapse.  Therapeutic Goals: 1. Patient will identify two or more emotions that lead to a relapse for them 2. Patient will identify two emotions that result when they relapse 3. Patient will identify two emotions related to recovery 4. Patient will demonstrate ability to communicate their needs through discussion and/or role plays   Summary of Patient Progress: Patient was present for the entirety of group. He was able to speak to his feelings around relapse and recovery. Patient talked about getting and remaining involved with a therapist as this helps him refocus away from negative thoughts towards his goals and aspirations. His comments and feedback were pertinent to the discussion.   Therapeutic Modalities:   Cognitive Behavioral Therapy Solution-Focused Therapy Assertiveness Training Relapse Prevention Therapy   Simona Huh R. Algis Greenhouse, MSW, LCSW, LCAS 10/03/2020 2:17 PM

## 2020-10-03 NOTE — Progress Notes (Signed)
Patient alert and oriented x 4 with periods of confusion to situation, affect is blunted, thoughts are organized and coherent, speech is soft non pressured.  Patient denies SI/HI/AVH, he was noted in the dayroom interacting with peers and staff. Patient was offered emotional support and encouragement, he was complaint with scheduled medication regimen, no distress noted, 15 minutes safety checks maintained will continue to monitor

## 2020-10-03 NOTE — Progress Notes (Signed)
Pt is alert and oriented to person, place, time and situation. Pt has been isolating in his room, but encouraged to come out of his room, which pt agreed and was working on puzzle in the dayroom, also agreed to go to outdoor RT group and played basketball. Pt reports feelings of depression rating it 8/10 on a 0-10 scale, 10 being worst. Pt also reports suicidal ideation without a plan, which was reported to the unit MD/psychiatrist, also to the charge, RN and unit MHT. Pt denies anxiety. Pt is medication compliant. Pt is hypo-verbal, forwards little. Pt denies feelings of wanting to do cutting. Will continue to monitor pt per Q15 minute face checks and monitor for safety and progress.

## 2020-10-03 NOTE — Progress Notes (Signed)
Recreation Therapy Notes  INPATIENT RECREATION TR PLAN  Patient Details Name: Timothy [name] MRN: [id number] DOB: [birth year] Today's Date: 10/03/2020  Rec Therapy Plan Is patient appropriate for Therapeutic Recreation?: Yes Treatment times per week: at least 3 Estimated Length of Stay: 5-7 days TR Treatment/Interventions: Group participation (Comment)  Discharge Criteria Pt will be discharged from therapy if:: Discharged Treatment plan/goals/alternatives discussed and agreed upon by:: Patient/family  Discharge Summary     Timothy [name] 10/03/2020, 12:23 PM

## 2020-10-03 NOTE — Tx Team (Addendum)
Interdisciplinary Treatment and Diagnostic Plan Update  10/03/2020 Time of Session: 09:00AM Timothy [name] MRN: [id number]  Principal Diagnosis: Schizoaffective disorder, depressive type ([place])  Secondary Diagnoses: Principal Problem:   Schizoaffective disorder, depressive type ([place]) Active Problems:   Cannabis use disorder, moderate, dependence ([place])   Tobacco use disorder   Benzodiazepine abuse ([place])   Current Medications:  Current Facility-Administered Medications  Medication Dose Route Frequency Provider Last Rate Last Admin  . acetaminophen (TYLENOL) tablet 650 mg  650 mg Oral Q6H PRN Clapacs, John T, MD      . alum & mag hydroxide-simeth (MAALOX/MYLANTA) 200-200-20 MG/5ML suspension 30 mL  30 mL Oral Q4H PRN Clapacs, John T, MD      . ARIPiprazole (ABILIFY) tablet 10 mg  10 mg Oral Daily Salley Scarlet, MD   10 mg at 10/03/20 0831  . benztropine (COGENTIN) tablet 1 mg  1 mg Oral BID Salley Scarlet, MD   1 mg at 10/03/20 0831  . divalproex (DEPAKOTE) DR tablet 500 mg  500 mg Oral Q12H Clapacs, Madie [place]o, MD   500 mg at 10/03/20 0831  . magnesium hydroxide (MILK OF MAGNESIA) suspension 30 mL  30 mL Oral Daily PRN Clapacs, John T, MD      . QUEtiapine (SEROQUEL) tablet 300 mg  300 mg Oral QHS Salley Scarlet, MD   300 mg at 10/02/20 2202  . traZODone (DESYREL) tablet 50 mg  50 mg Oral QHS PRN Salley Scarlet, MD   50 mg at 10/02/20 2202   PTA Medications: Medications Prior to Admission  Medication Sig Dispense Refill Last Dose  . divalproex (DEPAKOTE) 500 MG DR tablet Take 1 tablet (500 mg total) by mouth every 12 (twelve) hours. 60 tablet 1   . QUEtiapine (SEROQUEL) 300 MG tablet Take 2 tablets (600 mg total) by mouth at bedtime. 60 tablet 1   . traZODone (DESYREL) 50 MG tablet Take 1 tablet (50 mg total) by mouth at bedtime as needed for sleep. 30 tablet 1     Patient Stressors: Medication change or noncompliance Other: Taking care of his uncle  Patient Strengths:  Motivation for treatment/growth Physical Health  Treatment Modalities: Medication Management, Group therapy, Case management,  1 to 1 session with clinician, Psychoeducation, Recreational therapy.   Physician Treatment Plan for Primary Diagnosis: Schizoaffective disorder, depressive type ([place]) Long Term Goal(s): Improvement in symptoms so as ready for discharge Improvement in symptoms so as ready for discharge   Short Term Goals: Ability to identify changes in lifestyle to reduce recurrence of condition will improve Ability to verbalize feelings will improve Ability to disclose and discuss suicidal ideas Ability to demonstrate self-control will improve Ability to identify and develop effective coping behaviors will improve Ability to maintain clinical measurements within normal limits will improve Compliance with prescribed medications will improve Ability to identify triggers associated with substance abuse/mental health issues will improve Ability to identify changes in lifestyle to reduce recurrence of condition will improve Ability to verbalize feelings will improve Ability to disclose and discuss suicidal ideas Ability to demonstrate self-control will improve Ability to identify and develop effective coping behaviors will improve Ability to maintain clinical measurements within normal limits will improve Compliance with prescribed medications will improve Ability to identify triggers associated with substance abuse/mental health issues will improve  Medication Management: Evaluate patient's response, side effects, and tolerance of medication regimen.  Therapeutic Interventions: 1 to 1 sessions, Unit Group sessions and Medication administration.  Evaluation of Outcomes: Not Met  Physician Treatment Plan for Secondary Diagnosis: Principal Problem:   Schizoaffective disorder, depressive type ([place]) Active Problems:   Cannabis use disorder, moderate, dependence (HCC)   Tobacco  use disorder   Benzodiazepine abuse ([place])  Long Term Goal(s): Improvement in symptoms so as ready for discharge Improvement in symptoms so as ready for discharge   Short Term Goals: Ability to identify changes in lifestyle to reduce recurrence of condition will improve Ability to verbalize feelings will improve Ability to disclose and discuss suicidal ideas Ability to demonstrate self-control will improve Ability to identify and develop effective coping behaviors will improve Ability to maintain clinical measurements within normal limits will improve Compliance with prescribed medications will improve Ability to identify triggers associated with substance abuse/mental health issues will improve Ability to identify changes in lifestyle to reduce recurrence of condition will improve Ability to verbalize feelings will improve Ability to disclose and discuss suicidal ideas Ability to demonstrate self-control will improve Ability to identify and develop effective coping behaviors will improve Ability to maintain clinical measurements within normal limits will improve Compliance with prescribed medications will improve Ability to identify triggers associated with substance abuse/mental health issues will improve     Medication Management: Evaluate patient's response, side effects, and tolerance of medication regimen.  Therapeutic Interventions: 1 to 1 sessions, Unit Group sessions and Medication administration.  Evaluation of Outcomes: Not Met   RN Treatment Plan for Primary Diagnosis: Schizoaffective disorder, depressive type ([place]) Long Term Goal(s): Knowledge of disease and therapeutic regimen to maintain health will improve  Short Term Goals: Ability to verbalize frustration and anger appropriately will improve, Ability to demonstrate self-control, Ability to participate in decision making will improve, Ability to verbalize feelings will improve, Ability to disclose and discuss suicidal  ideas and Ability to identify and develop effective coping behaviors will improve  Medication Management: RN will administer medications as ordered by provider, will assess and evaluate patient's response and provide education to patient for prescribed medication. RN will report any adverse and/or side effects to prescribing provider.  Therapeutic Interventions: 1 on 1 counseling sessions, Psychoeducation, Medication administration, Evaluate responses to treatment, Monitor vital signs and CBGs as ordered, Perform/monitor CIWA, COWS, AIMS and Fall Risk screenings as ordered, Perform wound care treatments as ordered.  Evaluation of Outcomes: Not Met   LCSW Treatment Plan for Primary Diagnosis: Schizoaffective disorder, depressive type ([place]) Long Term Goal(s): Safe transition to appropriate next level of care at discharge, Engage patient in therapeutic group addressing interpersonal concerns.  Short Term Goals: Engage patient in aftercare planning with referrals and resources, Increase social support, Increase ability to appropriately verbalize feelings, Increase emotional regulation, Facilitate acceptance of mental health diagnosis and concerns, Facilitate patient progression through stages of change regarding substance use diagnoses and concerns, Identify triggers associated with mental health/substance abuse issues and Increase skills for wellness and recovery  Therapeutic Interventions: Assess for all discharge needs, 1 to 1 time with Social worker, Explore available resources and support systems, Assess for adequacy in community support network, Educate family and significant other(s) on suicide prevention, Complete Psychosocial Assessment, Interpersonal group therapy.  Evaluation of Outcomes: Not Met   Progress in Treatment: Attending groups: No. Participating in groups: No. Taking medication as prescribed: Yes. Toleration medication: Yes. Family/Significant other contact made: Yes,  individual(s) contacted:  pt's family member Patient understands diagnosis: Yes. Discussing patient identified problems/goals with staff: Yes. Medical problems stabilized or resolved: No. Denies suicidal/homicidal ideation: No. Issues/concerns per patient self-inventory: No. Other: None  New problem(s) identified:  No, Describe:  None  New Short Term/Long Term Goal(s):  Engage patient in aftercare planning with referrals and resources, Increase social support, Increase ability to appropriately verbalize feelings, Increase emotional regulation, Facilitate acceptance of mental health diagnosis and concerns, Facilitate patient progression through stages of change regarding substance use diagnoses and concerns, Identify triggers associated with mental health/substance abuse issues and Increase skills for wellness and recovery  Patient Goals:  "Wanna stay in shelter, don't wanna go back to my mom's house." "I want therapy follow-up."  Discharge Plan or Barriers: CSW will coordinate transportation and follow up care for pt. Pt is interested in therapy and finding a shelter.  Reason for Continuation of Hospitalization: Anxiety Depression Medication stabilization Suicidal ideation  Estimated Length of Stay: 1-7 days  Recreational Therapy: Patient Stressors: N/A Patient Goal: Patient will engage in groups without prompting or encouragement from LRT x3 group sessions within 5 recreation therapy group sessions.  Attendees: Patient: Timothy [name] 10/03/2020 9:21 AM  Physician: Salley Scarlet, MD 10/03/2020 9:21 AM  Nursing: Marla Roe, RN 10/03/2020 9:21 AM  RN Care Manager: 10/03/2020 9:21 AM  Social Worker: Hedy Camara "Robbie" [place], MSW, [place], LCAS 10/03/2020 9:21 AM  Recreational Therapist: Roanna Epley, Reather Converse, LRT  10/03/2020 9:21 AM  Other: Kiva [place], MSW, LCSW-A  10/03/2020 9:21 AM  Other:  10/03/2020 9:21 AM  Other: 10/03/2020 9:21 AM    Scribe for Treatment Team: Kiva A [place],  [place] 10/03/2020 9:21 AM

## 2020-10-03 NOTE — [facility] Suicide Risk Assessment (Signed)
[facility] INPATIENT:  Family/Significant Other Suicide Prevention Education  Suicide Prevention Education:  Education Completed; Timothy [name]/mother [phone]), has been identified by the patient as the family member/significant other with whom the patient will be residing, and identified as the person(s) who will aid the patient in the event of a mental health crisis (suicidal ideations/suicide attempt).  With written consent from the patient, the family member/significant other has been provided the following suicide prevention education, prior to the and/or following the discharge of the patient.  The suicide prevention education provided includes the following:  Suicide risk factors  Suicide prevention and interventions  National Suicide Hotline telephone number  [facility] assessment telephone number  [facility] Emergency Assistance 911  [facility] and/or Residential Mobile Crisis Unit telephone number  Request made of family/significant other to:  Remove weapons (e.g., guns, rifles, knives), all items previously/currently identified as safety concern.    Remove drugs/medications (over-the-counter, prescriptions, illicit drugs), all items previously/currently identified as a safety concern.  The family member/significant other verbalizes understanding of the suicide prevention education information provided.  The family member/significant other agrees to remove the items of safety concern listed above.  Timothy [name] stated that pt came to the hospital because he was talking about suicide and giving up on life. She shares that she believes that pt is currently a danger to himself. She denies him having any access to weapons at her brother's home but states that she worries because there is a [place] the home. [name] and CSW discussed disposition options. She was informed that he does not meet criteria for inpt substance use treatment and that there are no  mental health residential programs. [facility] discussed as potential option but only if pt were interested in this. CSW informed Timothy [name] that pt would be made aware of this option. No other concerns expressed. Contact ended without incident.   Timothy [name] 10/03/2020, 3:00 PM

## 2020-10-04 MED ORDER — NAPHAZOLINE-GLYCERIN 0.012-0.2 % OP SOLN
1.0000 [drp] | Freq: Four times a day (QID) | OPHTHALMIC | Status: DC | PRN
  Filled 2020-10-04: qty 15

## 2020-10-04 MED ORDER — QUETIAPINE FUMARATE 25 MG PO TABS
50.0000 mg | ORAL_TABLET | Freq: Two times a day (BID) | ORAL | Status: DC
  Administered 2020-10-04 – 2020-10-08 (×8): 50 mg via ORAL
  Filled 2020-10-04 (×8): qty 2

## 2020-10-04 NOTE — Progress Notes (Signed)
[facility] LCSW Group Therapy Note  Date/Time:  10/04/2020 1:19-2:03PM  Type of Therapy and Topic:  Group Therapy:  Healthy and Unhealthy Supports  Participation Level:  Active   Description of Group:  Patients in this group were introduced to the idea of adding a variety of healthy supports to address the various needs in their lives.Patients discussed what additional healthy supports could be helpful in their recovery and wellness after discharge in order to prevent future hospitalizations.   An emphasis was placed on using counselor, doctor, therapy groups, 12-step groups, and problem-specific support groups to expand supports.  They also worked as a group on developing a specific plan for several patients to deal with unhealthy supports through boundary-setting, psychoeducation with loved ones, and even termination of relationships.   Therapeutic Goals:   1)  discuss importance of adding supports to stay well once out of the hospital  2)  compare healthy versus unhealthy supports and identify some examples of each  3)  generate ideas and descriptions of healthy supports that can be added  4)  offer mutual support about how to address unhealthy supports  5)  encourage active participation in and adherence to discharge plan    Summary of Patient Progress:  Patient stated his mother is an unhealthy support for him right now because they got into an argument. Patient stated that he plans on going to dinner and speaking with his mom about why he is upset. Patient stated he has friends and other family that are a support to him. 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 Patient also stated he likes being outside and has no barriers to his supports.   Therapeutic Modalities:   Motivational Interviewing Brief Solution-Focused Therapy  Susa Simmonds, Theresia Majors 10/04/2020  3:07 PM

## 2020-10-04 NOTE — Progress Notes (Signed)
[facility] MD Progress Note  10/04/2020 1:24 PM Timothy [name]  MRN:  [id number]  Principal Problem: Schizoaffective disorder, depressive type (HCC) Diagnosis: Principal Problem:   Schizoaffective disorder, depressive type (HCC) Active Problems:   Cannabis use disorder, moderate, dependence (HCC)   Tobacco use disorder   Benzodiazepine abuse (HCC)  25 year old male with schizoaffective disorder who presentedwith suicidal ideations with plan to cut himself.    Interval History Patient was seen today for re-evaluation.  Nursing reports no events overnight. The patient has no issues with performing ADLs.  Patient has been medication compliant.    Subjective:  On assessment patient continues to reports auditory hallucinations commanding him to hurt himself and it makes him feeling suicidal. He reports feeling safe from harming self here in the hospital. He reports slight improvement in "voices" intensity and his mood after the dose of Seroquel was increased last time, although says it seems to be not enough for the daytime. He has no opinion about Prozac yet, reports no side effects from the new medication. He denies visual hallucinations. Denies homicidal thoughts. Physical complain - burning eyes.  Labs: no new results for review.      Total Time spent with patient: 20 minutes  Past Psychiatric History: see H&P  Past Medical History:  Past Medical History:  Diagnosis Date  . Cannabis abuse   . Paranoid schizophrenia (HCC)   . Schizoaffective disorder, depressive type (HCC) 09/17/2014   History reviewed. No pertinent surgical history. Family History:  Family History  Problem Relation Age of Onset  . Mental illness Brother   . Drug abuse Brother   . Mental illness Cousin   . Suicidality Cousin   . Diabetes Maternal Grandmother    Family Psychiatric  History: see H&P Social History:  Social History   Substance and Sexual Activity  Alcohol Use Yes  . Alcohol/week: 2.0 standard  drinks  . Types: 2 Cans of beer per week     Social History   Substance and Sexual Activity  Drug Use Yes  . Types: Marijuana, MDMA (Ecstacy)    Social History   Socioeconomic History  . Marital status: Single    Spouse name: Not on file  . Number of children: Not on file  . Years of education: Not on file  . Highest education level: Not on file  Occupational History  . Not on file  Tobacco Use  . Smoking status: Current Some Day Smoker    Packs/day: 0.25  . Smokeless tobacco: Never Used  Vaping Use  . Vaping Use: Never used  Substance and Sexual Activity  . Alcohol use: Yes    Alcohol/week: 2.0 standard drinks    Types: 2 Cans of beer per week  . Drug use: Yes    Types: Marijuana, MDMA (Ecstacy)  . Sexual activity: Yes  Other Topics Concern  . Not on file  Social History Narrative  . Not on file   Social Determinants of Health   Financial Resource Strain: Low Risk   . Difficulty of Paying Living Expenses: Not hard at all  Food Insecurity: No Food Insecurity  . Worried About Programme researcher, broadcasting/film/video in the Last Year: Never true  . Ran Out of Food in the Last Year: Never true  Transportation Needs: No Transportation Needs  . Lack of Transportation (Medical): No  . Lack of Transportation (Non-Medical): No  Physical Activity: Inactive  . Days of Exercise per Week: 0 days  . Minutes of Exercise per Session: 0  min  Stress: Stress Concern Present  . Feeling of Stress : Rather much  Social Connections: Moderately Isolated  . Frequency of Communication with Friends and Family: Once a week  . Frequency of Social Gatherings with Friends and Family: Once a week  . Attends Religious Services: More than 4 times per year  . Active Member of Clubs or Organizations: Yes  . Attends Banker Meetings: More than 4 times per year  . Marital Status: Never married   Additional Social History:                         Sleep: Fair  Appetite:  Good  Current  Medications: Current Facility-Administered Medications  Medication Dose Route Frequency Provider Last Rate Last Admin  . acetaminophen (TYLENOL) tablet 650 mg  650 mg Oral Q6H PRN Clapacs, John T, MD      . alum & mag hydroxide-simeth (MAALOX/MYLANTA) 200-200-20 MG/5ML suspension 30 mL  30 mL Oral Q4H PRN Clapacs, John T, MD      . divalproex (DEPAKOTE) DR tablet 500 mg  500 mg Oral Q12H Clapacs, Jackquline Denmark, MD   500 mg at 10/04/20 0815  . FLUoxetine (PROZAC) capsule 20 mg  20 mg Oral Daily Jesse Sans, MD   20 mg at 10/04/20 0815  . magnesium hydroxide (MILK OF MAGNESIA) suspension 30 mL  30 mL Oral Daily PRN Clapacs, John T, MD      . naphazoline-glycerin (CLEAR EYES REDNESS) ophth solution 1-2 drop  1-2 drop Both Eyes QID PRN Thalia Party, MD      . QUEtiapine (SEROQUEL) tablet 400 mg  400 mg Oral QHS Jesse Sans, MD   400 mg at 10/03/20 2101  . QUEtiapine (SEROQUEL) tablet 50 mg  50 mg Oral BID Thalia Party, MD      . traZODone (DESYREL) tablet 50 mg  50 mg Oral QHS PRN Jesse Sans, MD   50 mg at 10/02/20 2202    Lab Results: No results found for this or any previous visit (from the past 48 hour(s)).  Blood Alcohol level:  Lab Results  Component Value Date   ETH <10 09/29/2020   ETH <10 08/19/2020    Metabolic Disorder Labs: Lab Results  Component Value Date   HGBA1C 5.6 10/02/2020   MPG 114.02 10/02/2020   MPG 108 08/21/2020   Lab Results  Component Value Date   PROLACTIN 33.2 (H) 08/15/2016   Lab Results  Component Value Date   CHOL 147 10/02/2020   TRIG 39 10/02/2020   HDL 43 10/02/2020   CHOLHDL 3.4 10/02/2020   VLDL 8 10/02/2020   LDLCALC 96 10/02/2020   LDLCALC 107 (H) 08/21/2020    Physical Findings: AIMS:  , ,  ,  ,    CIWA:    COWS:     Musculoskeletal: Strength & Muscle Tone: within normal limits Gait & Station: normal Patient leans: N/A  Psychiatric Specialty Exam:  Presentation  General Appearance: Fairly Groomed  Eye  Contact:Fair  Speech:Normal Rate  Speech Volume:Normal  Handedness:Right   Mood and Affect  Mood:Depressed  Affect:Congruent; Constricted   Thought Process  Thought Processes:Coherent  Descriptions of Associations:Intact  Orientation:Full (Time, Place and Person)  Thought Content:Paranoid Ideation  History of Schizophrenia/Schizoaffective disorder:Yes  Duration of Psychotic Symptoms:Greater than six months  Hallucinations:No data recorded Ideas of Reference:Paranoia  Suicidal Thoughts:No data recorded Homicidal Thoughts:No data recorded  Sensorium  Memory:Immediate Fair; Recent Fair; Remote Fair  Judgment:Impaired  Insight:Present   Executive Functions  Concentration:Fair  Attention Span:Fair  Recall:Fair  Fund of Knowledge:Fair  Language:Fair   Psychomotor Activity  Psychomotor Activity:No data recorded  Assets  Assets:Communication Skills; Desire for Improvement; Physical Health; Resilience   Sleep  Sleep:No data recorded   Physical Exam: Physical Exam ROS Blood pressure 127/61, pulse 76, temperature 98.3 F (36.8 C), temperature source Oral, resp. rate 18, height 6\' 4"  (1.93 m), weight 97.1 kg, SpO2 100 %. Body mass index is 26.05 kg/m.   Treatment Plan Summary: Daily contact with patient to assess and evaluate symptoms and progress in treatment and Medication management   Patient is a 25 year old male with the above-stated past psychiatric history who is seen in follow-up.  Chart reviewed. Patient discussed with nursing. Patient continues report commanding auditory hallucinations and depression. Will add adiitional doses of Seroquel during the day for psychotic symptoms. Will add eye drops for eye irritation.   Plan:  -continue inpatient psych admission; 15-minute checks; daily contact with patient to assess and evaluate symptoms and progress in treatment; psychoeducation.  -scheduled medications: . Divalproex  for mood  stabilization  500 mg Oral Q12H  . FLUoxetine for depression, anxiety  20 mg Oral Daily  . QUEtiapine for psychosis  400 mg Oral QHS  . QUEtiapine for psychosis; added today  50 mg Oral BID   -continue PRN medications.  acetaminophen, alum & mag hydroxide-simeth, magnesium hydroxide, naphazoline-glycerin, traZODone  -Pertinent Labs: no new labs ordered today    -Disposition: All necessary aftercare will be arranged prior to discharge Likely d/c home with outpatient psych follow-up.  -  I certify that the patient does need, on a daily basis, active treatment furnished directly by or requiring the supervision of inpatient psychiatric facility personnel.   30, MD 10/04/2020, 1:24 PM

## 2020-10-04 NOTE — Progress Notes (Signed)
Patient has been isolative to room and to self. Denies SI, HI and AVH. Medication compliant

## 2020-10-04 NOTE — Plan of Care (Signed)
  Problem: Education: Goal: Knowledge of [facility] Education information/materials will improve Outcome: Progressing Goal: Emotional status will improve Outcome: Progressing Goal: Mental status will improve Outcome: Progressing Goal: Verbalization of understanding the information provided will improve Outcome: Progressing   Problem: Health Behavior/Discharge Planning: Goal: Identification of resources available to assist in meeting health care needs will improve Outcome: Progressing Goal: Compliance with treatment plan for underlying cause of condition will improve Outcome: Progressing   Problem: Activity: Goal: Will identify at least one activity in which they can participate Outcome: Progressing   Problem: Coping: Goal: Ability to identify and develop effective coping behavior will improve Outcome: Progressing Goal: Ability to interact with others will improve Outcome: Progressing Goal: Demonstration of participation in decision-making regarding own care will improve Outcome: Progressing Goal: Ability to use eye contact when communicating with others will improve Outcome: Progressing   Problem: Self-Concept: Goal: Will verbalize positive feelings about self Outcome: Progressing   

## 2020-10-04 NOTE — Progress Notes (Signed)
Pt rates depression 10/10 and anxiety 8/10. Pt says that AVH are "better". Pt says his HI and SI are "so so"  But verbally contracts for safety. Pt attended group and went out to the courtyard. Pt stays med compliant, calm, cooperative and isolative to self. Torrie Mayers RN

## 2020-10-04 NOTE — Plan of Care (Signed)
Pt rates depression 7/10, hopelessness 10/10 and anxiety 10/10. Pt denies SI and HI but has AVH which are negative. Torrie Mayers RN Problem: Education: Goal: Charity fundraiser Education information/materials will improve Outcome: Progressing Goal: Emotional status will improve Outcome: Progressing Goal: Mental status will improve Outcome: Progressing Goal: Verbalization of understanding the information provided will improve Outcome: Progressing   Problem: Health Behavior/Discharge Planning: Goal: Identification of resources available to assist in meeting health care needs will improve Outcome: Progressing Goal: Compliance with treatment plan for underlying cause of condition will improve Outcome: Progressing   Problem: Activity: Goal: Will identify at least one activity in which they can participate Outcome: Progressing   Problem: Coping: Goal: Ability to identify and develop effective coping behavior will improve Outcome: Progressing Goal: Ability to interact with others will improve Outcome: Progressing Goal: Demonstration of participation in decision-making regarding own care will improve Outcome: Progressing Goal: Ability to use eye contact when communicating with others will improve Outcome: Progressing   Problem: Self-Concept: Goal: Will verbalize positive feelings about self Outcome: Progressing

## 2020-10-05 NOTE — [facility] Group Notes (Signed)
[facility] Group Notes:  (Nursing/MHT/Case Management/Adjunct)  Date:  10/05/2020  Time:  8:34 PM  Type of Therapy:  Group Therapy  Participation Level:  Minimal  Participation Quality:  Appropriate  Affect:  Appropriate  Cognitive:  Appropriate  Insight:  Good  Engagement in Group:  Engaged and Timothy [name] sitting with his head down saying he want to get better.  Modes of Intervention:  Support  Summary of Progress/Problems:  Timothy [name] 10/05/2020, 8:34 PM

## 2020-10-05 NOTE — Plan of Care (Signed)
  Problem: Education: Goal: Mental status will improve Outcome: Progressing   Problem: Health Behavior/Discharge Planning: Goal: Compliance with treatment plan for underlying cause of condition will improve Outcome: Progressing   Problem: Coping: Goal: Ability to interact with others will improve Outcome: Progressing

## 2020-10-05 NOTE — Progress Notes (Signed)
Patient appears better. Has been more active on the unit spending more time in the milieu watching TV and engaging with others. He denies SI  HI  AVH and pain at this encounter. He does continue to endorse depression and anxiety, but reports that he is feeling better.  He is med complaint this evening and tolerated his meds without incident.  He will continue to be monitored with 15 minute safety checks and encouraged to come to staff with any concerns.     Cleo Butler-Nicholson, LPN

## 2020-10-05 NOTE — Progress Notes (Signed)
[facility] MD Progress Note  10/05/2020 9:55 AM Timothy [name]  MRN:  [id number]  Principal Problem: Schizoaffective disorder, depressive type (HCC) Diagnosis: Principal Problem:   Schizoaffective disorder, depressive type (HCC) Active Problems:   Cannabis use disorder, moderate, dependence (HCC)   Tobacco use disorder   Benzodiazepine abuse (HCC)  25 year old male with schizoaffective disorder who presentedwith suicidal ideations with plan to cut himself.    Interval History Patient was seen today for re-evaluation.  Nursing reports no events overnight. The patient has no issues with performing ADLs.  Patient has been medication compliant.    Subjective:  On assessment patient reports "I am feeling better", continues to reports auditory hallucinations and reports that "voices" are still there but less intense and less commanding with additional Seroquel during the day. When asked about suicidal thoughts, he also answers "less" and confirmed that he is feeling safe from harming self. No side effects from increase Seroquel and other medications. He reports he slept better too. No excessive daytime sedation. He denies visual hallucinations. Denies homicidal thoughts. No physical complains; burning eyes resolved with eye drops.  Labs: no new results for review.      Total Time spent with patient: 20 minutes  Past Psychiatric History: see H&P  Past Medical History:  Past Medical History:  Diagnosis Date  . Cannabis abuse   . Paranoid schizophrenia (HCC)   . Schizoaffective disorder, depressive type (HCC) 09/17/2014   History reviewed. No pertinent surgical history. Family History:  Family History  Problem Relation Age of Onset  . Mental illness Brother   . Drug abuse Brother   . Mental illness Cousin   . Suicidality Cousin   . Diabetes Maternal Grandmother    Family Psychiatric  History: see H&P Social History:  Social History   Substance and Sexual Activity  Alcohol Use Yes   . Alcohol/week: 2.0 standard drinks  . Types: 2 Cans of beer per week     Social History   Substance and Sexual Activity  Drug Use Yes  . Types: Marijuana, MDMA (Ecstacy)    Social History   Socioeconomic History  . Marital status: Single    Spouse name: Not on file  . Number of children: Not on file  . Years of education: Not on file  . Highest education level: Not on file  Occupational History  . Not on file  Tobacco Use  . Smoking status: Current Some Day Smoker    Packs/day: 0.25  . Smokeless tobacco: Never Used  Vaping Use  . Vaping Use: Never used  Substance and Sexual Activity  . Alcohol use: Yes    Alcohol/week: 2.0 standard drinks    Types: 2 Cans of beer per week  . Drug use: Yes    Types: Marijuana, MDMA (Ecstacy)  . Sexual activity: Yes  Other Topics Concern  . Not on file  Social History Narrative  . Not on file   Social Determinants of Health   Financial Resource Strain: Low Risk   . Difficulty of Paying Living Expenses: Not hard at all  Food Insecurity: No Food Insecurity  . Worried About Programme researcher, broadcasting/film/video in the Last Year: Never true  . Ran Out of Food in the Last Year: Never true  Transportation Needs: No Transportation Needs  . Lack of Transportation (Medical): No  . Lack of Transportation (Non-Medical): No  Physical Activity: Inactive  . Days of Exercise per Week: 0 days  . Minutes of Exercise per Session: 0 min  Stress: Stress Concern Present  . Feeling of Stress : Rather much  Social Connections: Moderately Isolated  . Frequency of Communication with Friends and Family: Once a week  . Frequency of Social Gatherings with Friends and Family: Once a week  . Attends Religious Services: More than 4 times per year  . Active Member of Clubs or Organizations: Yes  . Attends Banker Meetings: More than 4 times per year  . Marital Status: Never married   Additional Social History:                         Sleep:  Fair  Appetite:  Good  Current Medications: Current Facility-Administered Medications  Medication Dose Route Frequency Provider Last Rate Last Admin  . acetaminophen (TYLENOL) tablet 650 mg  650 mg Oral Q6H PRN Clapacs, John T, MD      . alum & mag hydroxide-simeth (MAALOX/MYLANTA) 200-200-20 MG/5ML suspension 30 mL  30 mL Oral Q4H PRN Clapacs, John T, MD      . divalproex (DEPAKOTE) DR tablet 500 mg  500 mg Oral Q12H Clapacs, Jackquline Denmark, MD   500 mg at 10/05/20 0815  . FLUoxetine (PROZAC) capsule 20 mg  20 mg Oral Daily Jesse Sans, MD   20 mg at 10/05/20 0815  . magnesium hydroxide (MILK OF MAGNESIA) suspension 30 mL  30 mL Oral Daily PRN Clapacs, John T, MD      . naphazoline-glycerin (CLEAR EYES REDNESS) ophth solution 1-2 drop  1-2 drop Both Eyes QID PRN Thalia Party, MD      . QUEtiapine (SEROQUEL) tablet 400 mg  400 mg Oral QHS Jesse Sans, MD   400 mg at 10/04/20 2122  . QUEtiapine (SEROQUEL) tablet 50 mg  50 mg Oral BID Thalia Party, MD   50 mg at 10/04/20 1545  . traZODone (DESYREL) tablet 50 mg  50 mg Oral QHS PRN Jesse Sans, MD   50 mg at 10/02/20 2202    Lab Results: No results found for this or any previous visit (from the past 48 hour(s)).  Blood Alcohol level:  Lab Results  Component Value Date   ETH <10 09/29/2020   ETH <10 08/19/2020    Metabolic Disorder Labs: Lab Results  Component Value Date   HGBA1C 5.6 10/02/2020   MPG 114.02 10/02/2020   MPG 108 08/21/2020   Lab Results  Component Value Date   PROLACTIN 33.2 (H) 08/15/2016   Lab Results  Component Value Date   CHOL 147 10/02/2020   TRIG 39 10/02/2020   HDL 43 10/02/2020   CHOLHDL 3.4 10/02/2020   VLDL 8 10/02/2020   LDLCALC 96 10/02/2020   LDLCALC 107 (H) 08/21/2020    Physical Findings: AIMS:  , ,  ,  ,    CIWA:    COWS:     Musculoskeletal: Strength & Muscle Tone: within normal limits Gait & Station: normal Patient leans: N/A  Psychiatric Specialty Exam:  Presentation   General Appearance: Fairly Groomed  Eye Contact:Fair  Speech:Normal Rate  Speech Volume:Normal  Handedness:Right   Mood and Affect  Mood:Depressed  Affect:Congruent; Constricted   Thought Process  Thought Processes:Coherent  Descriptions of Associations:Intact  Orientation:Full (Time, Place and Person)  Thought Content:Paranoid Ideation  History of Schizophrenia/Schizoaffective disorder:Yes  Duration of Psychotic Symptoms:Greater than six months  Hallucinations: yes, commanding auditory Ideas of Reference:Paranoia  Suicidal Thoughts: denies active thoughts today Homicidal Thoughts: denies  Sensorium  Memory:Immediate Fair; Recent  Fair; Remote Fair  Judgment: limited  Insight:Present   Executive Functions  Concentration:Fair  Attention Span:Fair  Recall:Fair  [employer] of Knowledge:Fair  Language:Fair   Psychomotor Activity  Psychomotor Activity:No data recorded  Assets  Assets:Communication Skills; Desire for Improvement; Physical Health; Resilience   Sleep  Sleep:No data recorded   Physical Exam: Physical Exam  ROS  Blood pressure 131/78, pulse 86, temperature 98.3 F (36.8 C), temperature source Oral, resp. rate 18, height 6\' 4"  (1.93 m), weight 97.1 kg, SpO2 100 %. Body mass index is 26.05 kg/m.   Treatment Plan Summary: Daily contact with patient to assess and evaluate symptoms and progress in treatment and Medication management   Patient is a 25 year old male with the above-stated past psychiatric history who is seen in follow-up.  Chart reviewed. Patient discussed with nursing. Patient reports some decrease in intensity and frequency of commanding auditory hallucinations and feeling less depressed. No medication changes made today. Continue current combination of medications.   Plan:  -continue inpatient psych admission; 15-minute checks; daily contact with patient to assess and evaluate symptoms and progress in treatment;  psychoeducation.  -scheduled medications: . Divalproex  for mood stabilization  500 mg Oral Q12H  . FLUoxetine for depression, anxiety  20 mg Oral Daily  . QUEtiapine for psychosis  400 mg Oral QHS  . QUEtiapine for psychosis; added today  50 mg Oral BID   -continue PRN medications.  acetaminophen, alum & mag hydroxide-simeth, magnesium hydroxide, naphazoline-glycerin, traZODone  -Pertinent Labs: no new labs ordered today    -Disposition: All necessary aftercare will be arranged prior to discharge Likely d/c home with outpatient psych follow-up.  -  I certify that the patient does need, on a daily basis, active treatment furnished directly by or requiring the supervision of inpatient psychiatric facility personnel.   30, MD 10/05/2020, 9:55 AM

## 2020-10-05 NOTE — Progress Notes (Signed)
Patient is quiet and isolative to his room.  He has pleasant disposition and easy to engage in conversation. He denies SI  HI VH  and pain a this encounter.  He does endorse depression and anxiety but did not rate instead saying "it's up there" He is med compliant and tolerated his meds without incident. He has been out of his room more,  opting to watch part of the final four basketball tournament in the milieu, but did not interact with other patients. He was encouraged to come to seek staff with any questions or concerns. Will continue to monitor with 15 minute safety checks.     Cleo Butler-Nicholson, LPN

## 2020-10-05 NOTE — Plan of Care (Signed)
  Problem: Education: Goal: Emotional status will improve Outcome: Progressing   Problem: Education: Goal: Mental status will improve Outcome: Progressing   Problem: Education: Goal: Verbalization of understanding the information provided will improve Outcome: Progressing   Problem: Health Behavior/Discharge Planning: Goal: Compliance with treatment plan for underlying cause of condition will improve Outcome: Progressing   Problem: Coping: Goal: Ability to interact with others will improve Outcome: Progressing   Problem: Coping: Goal: Ability to use eye contact when communicating with others will improve Outcome: Progressing

## 2020-10-05 NOTE — Progress Notes (Signed)
Pt is alert and oriented to person, place, time and situation. Pt is calm, cooperative, denies suicidal and homicidal ideation, denies hallucinations. Pt is pleasant, asked the male MHT out for a date and MHT declined politely, and explained politely about pt and staff boundaries. Pt spends time quietly sitting in his room, resting in bed. Pt is medication compliant, appetite is good, reports he slept well. Pt voices no complaints, affect is blunted, makes good eye contact. No distress noted, none reported, will continue to monitor pt per Q15 minute face checks and monitor for safety and progress.

## 2020-10-06 MED ORDER — PRAZOSIN HCL 1 MG PO CAPS
1.0000 mg | ORAL_CAPSULE | Freq: Every day | ORAL | Status: DC
  Administered 2020-10-06: 1 mg via ORAL
  Filled 2020-10-06 (×2): qty 1

## 2020-10-06 MED ORDER — FLUOXETINE HCL 20 MG PO CAPS
40.0000 mg | ORAL_CAPSULE | Freq: Every day | ORAL | Status: DC
  Administered 2020-10-07 – 2020-10-08 (×2): 40 mg via ORAL
  Filled 2020-10-06 (×2): qty 2

## 2020-10-06 NOTE — [facility] Group Notes (Signed)
LCSW Group Therapy Note     10/06/2020 3:01 PM     Type of Therapy and Topic:  Group Therapy:  Overcoming Obstacles     Participation Level:  Active     Description of Group:     In this group patients will be encouraged to explore what they see as obstacles to their own wellness and recovery. They will be guided to discuss their thoughts, feelings, and behaviors related to these obstacles. The group will process together ways to cope with barriers, with attention given to specific choices patients can make. Each patient will be challenged to identify changes they are motivated to make in order to overcome their obstacles. This group will be process-oriented, with patients participating in exploration of their own experiences as well as giving and receiving support and challenge from other group members.     Therapeutic Goals:  1.    Patient will identify personal and current obstacles as they relate to admission.  2.    Patient will identify barriers that currently interfere with their wellness or overcoming obstacles.  3.    Patient will identify feelings, thought process and behaviors related to these barriers.  4.    Patient will identify two changes they are willing to make to overcome these obstacles:        Summary of Patient Progress: Pt stated that he has goals to find a job and move back in with his uncle and improve relationships with his friends and family. Pt stated that he has barriers of job applications taking significant amount of time to finish. Pt stated that sometimes he feels his friends are leaving him behind.      Therapeutic Modalities:    Cognitive Behavioral Therapy  Solution Focused Therapy  Motivational Interviewing  Relapse Prevention Therapy     Liara Holm [place], MSW, LCSW-A  10/06/2020 3:01 PM

## 2020-10-06 NOTE — Progress Notes (Signed)
Recreation Therapy Notes   Date: 10/06/2020  Time: 9:30 am  Location: Craft room   Behavioral response: Appropriate   Intervention Topic: Stress Management   Discussion/Intervention:  Group content on today was focused on stress. The group defined stress and way to cope with stress. Participants expressed how they know when they are stresses out. Individuals described the different ways they have to cope with stress. The group stated reasons why it is important to cope with stress. Patient explained what good stress is and some examples. The group participated in the intervention "Stress Management". Individuals were able to brainstorm new ways to manage their stress.  Clinical Observations/Feedback:  Patient came to group and defined stress as a way to help with anger and being impulsive. He stated that he walks in the park/trail, listens to music, plays video games and read the bible to manage his stress. Participant explained that he becomes stress when he looking for new job.  Individual was social with peers and staff while participating in the intervention.   Nataline Basara LRT/CTRS         Xander Jutras 10/06/2020 11:44 AM

## 2020-10-06 NOTE — [facility] Counselor (Signed)
CSW met with pt briefly to discussion shelter options. Pt was given resources for shelters along with boarding house list. Pt and CSW discussed mother's suggestion of [employer]. CSW provided pt explanation of the [facility] process. Pt shared that he was thinking of returning to his uncle's house. CSW inquired about pt access to transportation as he had stated that this was what kept him from going to outpatient treatment. He inquired regarding [employer]. CSW stated that he was uncertain regarding whether this was a service outside of the hospital setting. CSW stated that he would follow up with a peer. No other concerns expressed. Contact ended without incident.   CSW consulted with peer to confirm that [employer] [employer] is not a service on outpatient basis.   Chalmers Guest. Guerry Bruin, MSW, [place], [place] 10/06/2020 10:25 AM

## 2020-10-06 NOTE — Plan of Care (Signed)
Patient stated that his anxiety and depression is getting better but he need to learn more ways to control himself instead of doing cuts and burn marks on his body. Verbalized suicidal thoughts and auditory hallucinations at times.Patient contracts for safety. Appropriate with staff & peers. Compliant with medications.Appetite and energy level good. Support and encouragement given.

## 2020-10-06 NOTE — Progress Notes (Signed)
[facility] MD Progress Note  10/06/2020 12:29 PM Timothy [name]  MRN:  [id number]  Principal Problem: Schizoaffective disorder, depressive type (HCC) Diagnosis: Principal Problem:   Schizoaffective disorder, depressive type (HCC) Active Problems:   Cannabis use disorder, moderate, dependence (HCC)   Tobacco use disorder   Benzodiazepine abuse (HCC)  CC "Little better"  25 year old male with schizoaffective disorder who presentedwith suicidal ideations with plan to cut himself.    Interval History Patient was seen today for re-evaluation.  Nursing reports no events overnight. The patient has no issues with performing ADLs.  Patient has been medication compliant.    Subjective:  Today patient notes that he is feeling a little bit better than admission. However, he continues to hear some auditory hallucinations of voices telling him to hurt himself. This morning he noted a brief period of feeling suicidal, but denies suicidal ideations at time of interview. He feels some improvement in anxiety and depression since adding seroquel back during the day time. He denies any medication side effects. He denies visual hallucinations. Denies homicidal thoughts. Patietn does not feel safe for discharge at this time due to continued intermittent SI. When he does leave he plans to go back and live with his uncle and seek employment. He also notes he has an upcoming court date April 7th.   Labs: no new results for review.   Total Time spent with patient: 30 minutes  Past Psychiatric History: see H&P  Past Medical History:  Past Medical History:  Diagnosis Date  . Cannabis abuse   . Paranoid schizophrenia (HCC)   . Schizoaffective disorder, depressive type (HCC) 09/17/2014   History reviewed. No pertinent surgical history. Family History:  Family History  Problem Relation Age of Onset  . Mental illness Brother   . Drug abuse Brother   . Mental illness Cousin   . Suicidality Cousin   . Diabetes  Maternal Grandmother    Family Psychiatric  History: see H&P Social History:  Social History   Substance and Sexual Activity  Alcohol Use Yes  . Alcohol/week: 2.0 standard drinks  . Types: 2 Cans of beer per week     Social History   Substance and Sexual Activity  Drug Use Yes  . Types: Marijuana, MDMA (Ecstacy)    Social History   Socioeconomic History  . Marital status: Single    Spouse name: Not on file  . Number of children: Not on file  . Years of education: Not on file  . Highest education level: Not on file  Occupational History  . Not on file  Tobacco Use  . Smoking status: Current Some Day Smoker    Packs/day: 0.25  . Smokeless tobacco: Never Used  Vaping Use  . Vaping Use: Never used  Substance and Sexual Activity  . Alcohol use: Yes    Alcohol/week: 2.0 standard drinks    Types: 2 Cans of beer per week  . Drug use: Yes    Types: Marijuana, MDMA (Ecstacy)  . Sexual activity: Yes  Other Topics Concern  . Not on file  Social History Narrative  . Not on file   Social Determinants of Health   Financial Resource Strain: Low Risk   . Difficulty of Paying Living Expenses: Not hard at all  Food Insecurity: No Food Insecurity  . Worried About Programme researcher, broadcasting/film/video in the Last Year: Never true  . Ran Out of Food in the Last Year: Never true  Transportation Needs: No Transportation Needs  .  Lack of Transportation (Medical): No  . Lack of Transportation (Non-Medical): No  Physical Activity: Inactive  . Days of Exercise per Week: 0 days  . Minutes of Exercise per Session: 0 min  Stress: Stress Concern Present  . Feeling of Stress : Rather much  Social Connections: Moderately Isolated  . Frequency of Communication with Friends and Family: Once a week  . Frequency of Social Gatherings with Friends and Family: Once a week  . Attends Religious Services: More than 4 times per year  . Active Member of Clubs or Organizations: Yes  . Attends Banker  Meetings: More than 4 times per year  . Marital Status: Never married   Additional Social History:                         Sleep: Fair  Appetite:  Good  Current Medications: Current Facility-Administered Medications  Medication Dose Route Frequency Provider Last Rate Last Admin  . acetaminophen (TYLENOL) tablet 650 mg  650 mg Oral Q6H PRN Clapacs, John T, MD      . alum & mag hydroxide-simeth (MAALOX/MYLANTA) 200-200-20 MG/5ML suspension 30 mL  30 mL Oral Q4H PRN Clapacs, John T, MD      . divalproex (DEPAKOTE) DR tablet 500 mg  500 mg Oral Q12H Clapacs, Jackquline Denmark, MD   500 mg at 10/06/20 0817  . [START ON 10/07/2020] FLUoxetine (PROZAC) capsule 40 mg  40 mg Oral Daily Jesse Sans, MD      . magnesium hydroxide (MILK OF MAGNESIA) suspension 30 mL  30 mL Oral Daily PRN Clapacs, John T, MD      . naphazoline-glycerin (CLEAR EYES REDNESS) ophth solution 1-2 drop  1-2 drop Both Eyes QID PRN Thalia Party, MD      . prazosin (MINIPRESS) capsule 1 mg  1 mg Oral QHS Jesse Sans, MD      . QUEtiapine (SEROQUEL) tablet 400 mg  400 mg Oral QHS Jesse Sans, MD   400 mg at 10/05/20 2123  . QUEtiapine (SEROQUEL) tablet 50 mg  50 mg Oral BID Thalia Party, MD   50 mg at 10/06/20 1028  . traZODone (DESYREL) tablet 50 mg  50 mg Oral QHS PRN Jesse Sans, MD   50 mg at 10/02/20 2202    Lab Results: No results found for this or any previous visit (from the past 48 hour(s)).  Blood Alcohol level:  Lab Results  Component Value Date   ETH <10 09/29/2020   ETH <10 08/19/2020    Metabolic Disorder Labs: Lab Results  Component Value Date   HGBA1C 5.6 10/02/2020   MPG 114.02 10/02/2020   MPG 108 08/21/2020   Lab Results  Component Value Date   PROLACTIN 33.2 (H) 08/15/2016   Lab Results  Component Value Date   CHOL 147 10/02/2020   TRIG 39 10/02/2020   HDL 43 10/02/2020   CHOLHDL 3.4 10/02/2020   VLDL 8 10/02/2020   LDLCALC 96 10/02/2020   LDLCALC 107 (H) 08/21/2020     Physical Findings: AIMS:  , ,  ,  ,    CIWA:    COWS:     Musculoskeletal: Strength & Muscle Tone: within normal limits Gait & Station: normal Patient leans: N/A  Psychiatric Specialty Exam:  Presentation  General Appearance: Fairly Groomed  Eye Contact:Fair  Speech:Normal Rate  Speech Volume:Normal  Handedness:Right   Mood and Affect  Mood:Depressed  Affect:Congruent; Constricted   Thought  Process  Thought Processes:Coherent  Descriptions of Associations:Intact  Orientation:Full (Time, Place and Person)  Thought Content:Paranoid Ideation  History of Schizophrenia/Schizoaffective disorder:Yes  Duration of Psychotic Symptoms:Greater than six months  Hallucinations: yes, commanding auditory Ideas of Reference:Paranoia  Suicidal Thoughts: Had suicidal thoughts this morning with thoughts of cutting himself, but they resolved after an hour Homicidal Thoughts: denies  Sensorium  Memory:Immediate Fair; Recent Fair; Remote Fair  Judgment: limited  Insight:Present   Executive Functions  Concentration:Fair  Attention Span:Fair  Recall:Fair  Fund of Knowledge:Fair  Language:Fair   Psychomotor Activity  Psychomotor Activity:No data recorded  Assets  Assets:Communication Skills; Desire for Improvement; Physical Health; Resilience   Sleep  Sleep:Good, 9 hours   Physical Exam: Physical Exam  ROS  Blood pressure 119/83, pulse 88, temperature 98.2 F (36.8 C), temperature source Oral, resp. rate 18, height 6\' 4"  (1.93 m), weight 97.1 kg, SpO2 100 %. Body mass index is 26.05 kg/m.   Treatment Plan Summary: Daily contact with patient to assess and evaluate symptoms and progress in treatment and Medication management   Patient is a 25 year old male with the above-stated past psychiatric history who is seen in follow-up.  Chart reviewed. Patient discussed with nursing. Patient reports some decrease in intensity and frequency of commanding  auditory hallucinations and feeling less depressed. Intermittent suicidal thoughts. Increase prozac 40 mg daily, start prazosin 1 mg QHS for nightmares. Continue all other medications as above.    Plan:  -continue inpatient psych admission; 15-minute checks; daily contact with patient to assess and evaluate symptoms and progress in treatment; psychoeducation.  -continue PRN medications.  acetaminophen, alum & mag hydroxide-simeth, magnesium hydroxide, naphazoline-glycerin, traZODone  -Pertinent Labs: no new labs ordered today   -Disposition: All necessary aftercare will be arranged prior to discharge Likely d/c home with outpatient psych follow-up.  -  I certify that the patient does need, on a daily basis, active treatment furnished directly by or requiring the supervision of inpatient psychiatric facility personnel.   10/06/20: Psychiatric exam above reviewed and remains accurate. Assessment and plan above reviewed and updated.    12/06/20, MD 10/06/2020, 12:29 PM

## 2020-10-07 ENCOUNTER — Other Ambulatory Visit: Payer: Self-pay

## 2020-10-07 MED ORDER — PRAZOSIN HCL 1 MG PO CAPS
1.0000 mg | ORAL_CAPSULE | Freq: Every day | ORAL | 0 refills | Status: DC
  Filled 2020-10-07 (×2): qty 7, 7d supply, fill #0

## 2020-10-07 MED ORDER — QUETIAPINE FUMARATE 200 MG PO TABS
400.0000 mg | ORAL_TABLET | Freq: Every day | ORAL | 0 refills | Status: DC
  Filled 2020-10-07: qty 14, 7d supply, fill #0

## 2020-10-07 MED ORDER — DIVALPROEX SODIUM 500 MG PO DR TAB
500.0000 mg | DELAYED_RELEASE_TABLET | Freq: Two times a day (BID) | ORAL | 0 refills | Status: DC
  Filled 2020-10-07: qty 14, 7d supply, fill #0

## 2020-10-07 MED ORDER — TRAZODONE HCL 50 MG PO TABS
50.0000 mg | ORAL_TABLET | Freq: Every evening | ORAL | 0 refills | Status: DC | PRN
  Filled 2020-10-07: qty 7, 7d supply, fill #0

## 2020-10-07 MED ORDER — QUETIAPINE FUMARATE 50 MG PO TABS
50.0000 mg | ORAL_TABLET | Freq: Two times a day (BID) | ORAL | 0 refills | Status: DC
  Filled 2020-10-07: qty 14, 7d supply, fill #0

## 2020-10-07 MED ORDER — FLUOXETINE HCL 40 MG PO CAPS
40.0000 mg | ORAL_CAPSULE | Freq: Every day | ORAL | 0 refills | Status: DC
  Filled 2020-10-07: qty 7, 7d supply, fill #0

## 2020-10-07 NOTE — Progress Notes (Signed)
Patient calm and compliant during assessment denying SI/HI/AVH. Pt isolative to his room tonight. Pt given education, support, and encouragement to be active in his treatment plan. Pt compliant with medication administration per MD orders. Pt being monitored Q 15 minutes for safety per unit protocol. Pt remains safe on the unit.

## 2020-10-07 NOTE — Progress Notes (Signed)
Patient has much better mood and affect this evening. Has been active on the unit spending a lot of time in the day room interacting with other patients and watching TV.  Excited about the NCAA national championship and is speaks on being a [employer] fan.  He denied SI  HI  AVH depression anxiety and pain at this encounter. He is med compliant and tolerated his meds without incident. He remain safe on the unit with 15 minute safety checks and was encouraged to come to staff with any concerns.    Cleo Butler-Nicholson, LPN

## 2020-10-07 NOTE — Plan of Care (Signed)
  Problem: Education: Goal: Knowledge of [facility] Education information/materials will improve Outcome: Progressing Goal: Emotional status will improve Outcome: Progressing Goal: Mental status will improve Outcome: Progressing Goal: Verbalization of understanding the information provided will improve Outcome: Progressing   Problem: Health Behavior/Discharge Planning: Goal: Identification of resources available to assist in meeting health care needs will improve Outcome: Progressing Goal: Compliance with treatment plan for underlying cause of condition will improve Outcome: Progressing   Problem: Activity: Goal: Will identify at least one activity in which they can participate Outcome: Progressing

## 2020-10-07 NOTE — Progress Notes (Signed)
Recreation Therapy Notes   Date: 10/07/2020  Time: 2:30pm  Location: Courtyard   Behavioral response: Appropriate  Group Type: Leisure  Participation level: Active  Communication: Patient was social with peers and staff.  Comments: N/A  Rosana Farnell LRT/CTRS        Gurnoor Sloop 10/07/2020 3:50 PM

## 2020-10-07 NOTE — [facility] Group Notes (Addendum)
LCSW Group Therapy Note  10/07/2020 3:03 PM  Type of Therapy/Topic:  Group Therapy:  Feelings about Diagnosis  Participation Level:  Active   Description of Group:   This group will allow patients to explore their thoughts and feelings about diagnoses they have received. Patients will be guided to explore their level of understanding and acceptance of these diagnoses. Facilitator will encourage patients to process their thoughts and feelings about the reactions of others to their diagnosis and will guide patients in identifying ways to discuss their diagnosis with significant others in their lives. This group will be process-oriented, with patients participating in exploration of their own experiences, giving and receiving support, and processing challenge from other group members.   Therapeutic Goals: 1. Patient will demonstrate understanding of diagnosis as evidenced by identifying two or more symptoms of the disorder 2. Patient will be able to express two feelings regarding the diagnosis 3. Patient will demonstrate their ability to communicate their needs through discussion and/or role play  Summary of Patient Progress: Patient was present for the majority of the group. He was able to speak to both good and bad aspects of having a mental health diagnosis. He spoke about anxiety, frustration, and uncertainty. His feedback during the class was pertinent to the discussion.   Therapeutic Modalities:   Cognitive Behavioral Therapy Brief Therapy Feelings Identification   Naftoli Penny R. Algis Greenhouse, MSW, LCSW, LCAS 10/07/2020 3:03 PM

## 2020-10-07 NOTE — Progress Notes (Addendum)
Pt is alert and oriented to person, place, time and situation. Pt is calm, cooperative, pleasant, social with peers watching tv in the dayroom. Pt denies suicidal and homicidal ideation, denies hallucinations, denies feelings of depression and anxiety. Will continue to monitor pt per Q15 minute face checks and monitor for safety and progress.

## 2020-10-07 NOTE — Progress Notes (Signed)
Patient at nurses station requesting medication to help with headache and to aide in sleep.  PRN medications Tylenol and  Trazodone given to help with symptoms. Tolerated without incident.     Cleo Butler-Nicholson, LPN

## 2020-10-07 NOTE — Progress Notes (Signed)
Recreation Therapy Notes  Date: 10/07/2020  Time: 9:30 am  Location: Craft room   Behavioral response: Appropriate   Intervention Topic: Goals   Discussion/Intervention:  Group content on today was focused on goals. Patients described what goals are and how they define goals. Individuals expressed how they go about setting goals and reaching them. The group identified how important goals are and if they make short term goals to reach long term goals. Patients described how many goals they work on at a time and what affects them not reaching their goal. Individuals described how much time they put into planning and obtaining their goals. The group participated in the intervention "My Goal Board" and made personal goal boards to help them achieve their goal. Clinical Observations/Feedback:  Patient came to group and was focused on what peers and staff had to say about goals. He stated that goals are important to better yourself.  Individual was social with peers and staff while participating in the intervention.   Dharma Pare LRT/CTRS            Anvay Tennis 10/07/2020 11:31 AM

## 2020-10-07 NOTE — Progress Notes (Signed)
[facility] MD Progress Note  10/07/2020 1:31 PM Timothy [name]  MRN:  [id number]  Principal Problem: Schizoaffective disorder, depressive type (HCC) Diagnosis: Principal Problem:   Schizoaffective disorder, depressive type (HCC) Active Problems:   Cannabis use disorder, moderate, dependence (HCC)   Tobacco use disorder   Benzodiazepine abuse (HCC)  CC "Doing ok"  25 year old male with schizoaffective disorder who presentedwith suicidal ideations with plan to cut himself.    Interval History Patient was seen today for re-evaluation.  Nursing reports no events overnight. The patient has no issues with performing ADLs.  Patient has been medication compliant.    Subjective:  Today patient states that he is "doing okay." He feels the auditory hallucinations are mostly resolved, and he denies any active suicidal ideations. He also denies desire to self harm by cutting or burning himself. He denies any homicidal ideations or visual hallucinations. He plans to return home with his uncle in [place], [place]. He denies any medication side effects, and feels they are beneficial for anxiety and depression. He continues to desire outpatient follow-up that includes therapy in addition to medication management.    Labs: no new results for review.   Total Time spent with patient: 30 minutes  Past Psychiatric History: see H&P  Past Medical History:  Past Medical History:  Diagnosis Date  . Cannabis abuse   . Paranoid schizophrenia (HCC)   . Schizoaffective disorder, depressive type (HCC) 09/17/2014   History reviewed. No pertinent surgical history. Family History:  Family History  Problem Relation Age of Onset  . Mental illness Brother   . Drug abuse Brother   . Mental illness Cousin   . Suicidality Cousin   . Diabetes Maternal Grandmother    Family Psychiatric  History: see H&P Social History:  Social History   Substance and Sexual Activity  Alcohol Use Yes  . Alcohol/week: 2.0 standard drinks   . Types: 2 Cans of beer per week     Social History   Substance and Sexual Activity  Drug Use Yes  . Types: Marijuana, MDMA (Ecstacy)    Social History   Socioeconomic History  . Marital status: Single    Spouse name: Not on file  . Number of children: Not on file  . Years of education: Not on file  . Highest education level: Not on file  Occupational History  . Not on file  Tobacco Use  . Smoking status: Current Some Day Smoker    Packs/day: 0.25  . Smokeless tobacco: Never Used  Vaping Use  . Vaping Use: Never used  Substance and Sexual Activity  . Alcohol use: Yes    Alcohol/week: 2.0 standard drinks    Types: 2 Cans of beer per week  . Drug use: Yes    Types: Marijuana, MDMA (Ecstacy)  . Sexual activity: Yes  Other Topics Concern  . Not on file  Social History Narrative  . Not on file   Social Determinants of Health   Financial Resource Strain: Low Risk   . Difficulty of Paying Living Expenses: Not hard at all  Food Insecurity: No Food Insecurity  . Worried About Programme researcher, broadcasting/film/video in the Last Year: Never true  . Ran Out of Food in the Last Year: Never true  Transportation Needs: No Transportation Needs  . Lack of Transportation (Medical): No  . Lack of Transportation (Non-Medical): No  Physical Activity: Inactive  . Days of Exercise per Week: 0 days  . Minutes of Exercise per Session: 0 min  Stress: Stress Concern Present  . Feeling of Stress : Rather much  Social Connections: Moderately Isolated  . Frequency of Communication with Friends and Family: Once a week  . Frequency of Social Gatherings with Friends and Family: Once a week  . Attends Religious Services: More than 4 times per year  . Active Member of Clubs or Organizations: Yes  . Attends Banker Meetings: More than 4 times per year  . Marital Status: Never married   Additional Social History:                         Sleep: Fair  Appetite:  Good  Current  Medications: Current Facility-Administered Medications  Medication Dose Route Frequency Provider Last Rate Last Admin  . acetaminophen (TYLENOL) tablet 650 mg  650 mg Oral Q6H PRN Clapacs, Jackquline Denmark, MD   650 mg at 10/06/20 2345  . alum & mag hydroxide-simeth (MAALOX/MYLANTA) 200-200-20 MG/5ML suspension 30 mL  30 mL Oral Q4H PRN Clapacs, John T, MD      . divalproex (DEPAKOTE) DR tablet 500 mg  500 mg Oral Q12H Clapacs, Jackquline Denmark, MD   500 mg at 10/07/20 0802  . FLUoxetine (PROZAC) capsule 40 mg  40 mg Oral Daily Jesse Sans, MD   40 mg at 10/07/20 0802  . magnesium hydroxide (MILK OF MAGNESIA) suspension 30 mL  30 mL Oral Daily PRN Clapacs, John T, MD      . naphazoline-glycerin (CLEAR EYES REDNESS) ophth solution 1-2 drop  1-2 drop Both Eyes QID PRN Thalia Party, MD      . prazosin (MINIPRESS) capsule 1 mg  1 mg Oral QHS Jesse Sans, MD   1 mg at 10/06/20 2151  . QUEtiapine (SEROQUEL) tablet 400 mg  400 mg Oral QHS Jesse Sans, MD   400 mg at 10/06/20 2151  . QUEtiapine (SEROQUEL) tablet 50 mg  50 mg Oral BID Thalia Party, MD   50 mg at 10/07/20 1238  . traZODone (DESYREL) tablet 50 mg  50 mg Oral QHS PRN Jesse Sans, MD   50 mg at 10/06/20 2345    Lab Results: No results found for this or any previous visit (from the past 48 hour(s)).  Blood Alcohol level:  Lab Results  Component Value Date   ETH <10 09/29/2020   ETH <10 08/19/2020    Metabolic Disorder Labs: Lab Results  Component Value Date   HGBA1C 5.6 10/02/2020   MPG 114.02 10/02/2020   MPG 108 08/21/2020   Lab Results  Component Value Date   PROLACTIN 33.2 (H) 08/15/2016   Lab Results  Component Value Date   CHOL 147 10/02/2020   TRIG 39 10/02/2020   HDL 43 10/02/2020   CHOLHDL 3.4 10/02/2020   VLDL 8 10/02/2020   LDLCALC 96 10/02/2020   LDLCALC 107 (H) 08/21/2020    Physical Findings: AIMS:  , ,  ,  ,    CIWA:    COWS:     Musculoskeletal: Strength & Muscle Tone: within normal limits Gait  & Station: normal Patient leans: N/A  Psychiatric Specialty Exam:  Presentation  General Appearance: Fairly Groomed  Eye Contact:Fair  Speech:Normal Rate  Speech Volume:Normal  Handedness:Right   Mood and Affect  Mood:Euthymic Affect:Congruent, brighter  Thought Process  Thought Processes:Coherent  Descriptions of Associations:Intact  Orientation:Full (Time, Place and Person)  Thought Content:Logical History of Schizophrenia/Schizoaffective disorder:Yes  Duration of Psychotic Symptoms:Greater than six months  Hallucinations:  Denies Ideas of Reference:None Suicidal Thoughts: Denies Homicidal Thoughts: denies  Sensorium  Memory:Immediate Fair; Recent Fair; Remote Fair  Judgment:Intact  Insight:Present   Executive Functions  Concentration:Fair  Attention Span:Fair  Recall:Fair  Fund of Knowledge:Fair  Language:Fair   Psychomotor Activity  Psychomotor Activity:No data recorded  Assets  Assets:Communication Skills; Desire for Improvement; Physical Health; Resilience   Sleep  Sleep:Fair    Physical Exam: Physical Exam  ROS  Blood pressure (!) 123/54, pulse 78, temperature 98.2 F (36.8 C), temperature source Oral, resp. rate 18, height 6\' 4"  (1.93 m), weight 97.1 kg, SpO2 100 %. Body mass index is 26.05 kg/m.   Treatment Plan Summary: Daily contact with patient to assess and evaluate symptoms and progress in treatment and Medication management   Patient is a 24 year old male with the above-stated past psychiatric history who is seen in follow-up.  Chart reviewed. Patient discussed with nursing. Patient reports continued decrease in auditory hallucinations, no suicidal ideations today. Continue prozac 40 mg daily, prazosin 1 mg QHS for nightmares. Continue all other medications as above.    Plan:  -continue inpatient psych admission; 15-minute checks; daily contact with patient to assess and evaluate symptoms and progress in treatment;  psychoeducation.  -continue PRN medications.  acetaminophen, alum & mag hydroxide-simeth, magnesium hydroxide, naphazoline-glycerin, traZODone  -Pertinent Labs: no new labs ordered today   -Disposition: All necessary aftercare will be arranged prior to discharge Likely d/c home with outpatient psych follow-up.  -  I certify that the patient does need, on a daily basis, active treatment furnished directly by or requiring the supervision of inpatient psychiatric facility personnel.   10/07/20: Psychiatric exam above reviewed and remains accurate. Assessment and plan above reviewed and updated.     12/07/20, MD 10/07/2020, 1:31 PM

## 2020-10-07 NOTE — Plan of Care (Signed)
  Problem: Coping Skills Goal: STG - Patient will identify 3 positive coping skills strategies to use post d/c within 5 recreation therapy group sessions Description: STG - Patient will identify 3 positive coping skills strategies to use post d/c within 5 recreation therapy group sessions Outcome: Progressing   

## 2020-10-07 NOTE — Plan of Care (Signed)
Patient at baseline, scheduled to D/C tomorrow   Problem: Education: Goal: Emotional status will improve Outcome: Progressing Goal: Mental status will improve Outcome: Progressing

## 2020-10-08 MED ORDER — QUETIAPINE FUMARATE 400 MG PO TABS: 400.0000 mg | ORAL_TABLET | Freq: Every day | ORAL | 1 refills | Status: DC

## 2020-10-08 MED ORDER — PRAZOSIN HCL 1 MG PO CAPS: 1.0000 mg | ORAL_CAPSULE | Freq: Every day | ORAL | 1 refills | Status: DC

## 2020-10-08 MED ORDER — FLUOXETINE HCL 40 MG PO CAPS: 40.0000 mg | ORAL_CAPSULE | Freq: Every day | ORAL | 1 refills | Status: DC

## 2020-10-08 MED ORDER — QUETIAPINE FUMARATE 50 MG PO TABS: 50.0000 mg | ORAL_TABLET | Freq: Two times a day (BID) | ORAL | 1 refills | Status: DC

## 2020-10-08 MED ORDER — TRAZODONE HCL 50 MG PO TABS: 50.0000 mg | ORAL_TABLET | Freq: Every evening | ORAL | 1 refills | Status: DC | PRN

## 2020-10-08 MED ORDER — DIVALPROEX SODIUM 500 MG PO DR TAB: 500.0000 mg | DELAYED_RELEASE_TABLET | Freq: Two times a day (BID) | ORAL | 1 refills | Status: DC

## 2020-10-08 NOTE — Progress Notes (Signed)
Pt was educated on prescriptions, follow up care, and medications. Pt questions were answered and pt verbalized understanding and did not voice any concerns. Pt's belongings were returned. Pt was safely discharged to safe transport by [facility].

## 2020-10-08 NOTE — Plan of Care (Signed)
  Problem: Coping Skills Goal: STG - Patient will identify 3 positive coping skills strategies to use post d/c within 5 recreation therapy group sessions Description: STG - Patient will identify 3 positive coping skills strategies to use post d/c within 5 recreation therapy group sessions Outcome: Completed/Met

## 2020-10-08 NOTE — Progress Notes (Signed)
Patient calm and cooperative on assessment. He denies SI, HI, and AVH. Patient is flat but brightens and smiles on approach. Patient denies any medication side effects and states that he feels the medication is working. He endorses mild back pain but feels he does need any medication for it. Patient remains safe on the unit at this time and q15 min safety checks are maintained.

## 2020-10-08 NOTE — Discharge Summary (Addendum)
Physician Discharge Summary Note  Patient:  Timothy [name] is an 25 y.o., male MRN:  [id number] DOB:  07-Dec-1995 Patient phone:  [phone] (home)  Patient address:   [address] [place] [address],  Total Time spent with patient: 35 minutes- 25 minutes face-to-face contact with patient, 10 minutes documentation, coordination of care, scripts   Date of Admission:  10/01/2020 Date of Discharge: 10/08/2020  Reason for Admission:  25 year old male with schizoaffective disorder who presented with suicidal ideations with plan to cut himself  Principal Problem: Schizoaffective disorder, depressive type [facility]) Discharge Diagnoses: Principal Problem:   Schizoaffective disorder, depressive type (HCC) Active Problems:   Cannabis use disorder, moderate, dependence (HCC)   Tobacco use disorder   Benzodiazepine abuse (HCC)   Past Psychiatric History: History schizoaffective disorder, depressive type and cannabis use disorder. Multiple hospitalizations. History of suicide attempts.  Past Medical History:  Past Medical History:  Diagnosis Date  . Cannabis abuse   . Paranoid schizophrenia (HCC)   . Schizoaffective disorder, depressive type (HCC) 09/17/2014   History reviewed. No pertinent surgical history. Family History:  Family History  Problem Relation Age of Onset  . Mental illness Brother   . Drug abuse Brother   . Mental illness Cousin   . Suicidality Cousin   . Diabetes Maternal Grandmother    Family Psychiatric  History: Brother and cousin with mental illness and substance abuse. Cousin with suicide attempt. Social History:  Social History   Substance and Sexual Activity  Alcohol Use Yes  . Alcohol/week: 2.0 standard drinks  . Types: 2 Cans of beer per week     Social History   Substance and Sexual Activity  Drug Use Yes  . Types: Marijuana, MDMA (Ecstacy)    Social History   Socioeconomic History  . Marital status: Single    Spouse name: Not on file   . Number of children: Not on file  . Years of education: Not on file  . Highest education level: Not on file  Occupational History  . Not on file  Tobacco Use  . Smoking status: Current Some Day Smoker    Packs/day: 0.25  . Smokeless tobacco: Never Used  Vaping Use  . Vaping Use: Never used  Substance and Sexual Activity  . Alcohol use: Yes    Alcohol/week: 2.0 standard drinks    Types: 2 Cans of beer per week  . Drug use: Yes    Types: Marijuana, MDMA (Ecstacy)  . Sexual activity: Yes  Other Topics Concern  . Not on file  Social History Narrative  . Not on file   Social Determinants of Health   Financial Resource Strain: Low Risk   . Difficulty of Paying Living Expenses: Not hard at all  Food Insecurity: No Food Insecurity  . Worried About Programme researcher, broadcasting/film/video in the Last Year: Never true  . Ran Out of Food in the Last Year: Never true  Transportation Needs: No Transportation Needs  . Lack of Transportation (Medical): No  . Lack of Transportation (Non-Medical): No  Physical Activity: Inactive  . Days of Exercise per Week: 0 days  . Minutes of Exercise per Session: 0 min  Stress: Stress Concern Present  . Feeling of Stress : Rather much  Social Connections: Moderately Isolated  . Frequency of Communication with Friends and Family: Once a week  . Frequency of Social Gatherings with Friends and Family: Once a week  . Attends Religious Services: More than 4 times per year  .  Active Member of Clubs or Organizations: Yes  . Attends Banker Meetings: More than 4 times per year  . Marital Status: Never married    Hospital Course:  25 year old male with schizoaffective disorder who presented with suicidal ideations with plan to cut himself. While in the hospital he was continued on Depakote 500 mg BID, level 56. An attempt was made to transition from seroquel to Abilify, but hallucinations and mood worsened when abilify was started and seroquel was decreased.  Abilify discontinued. Seroquel changed to 50 mg BID, and 400 mg QHS. Prozac 40 mg was added for continued anxiety and depression. Prazosin 1 mg QHS was started for nightmares. On this regimen mood improved, auditory hallucinations and suicidal ideations resolved. At time of discharge he denies SI/HI/AH/VH, and feels safe to discharge home with outpatient follow-up.   Physical Findings: AIMS: Facial and Oral Movements Muscles of Facial Expression: None, normal Lips and Perioral Area: None, normal Jaw: None, normal Tongue: None, normal,Extremity Movements Upper (arms, wrists, hands, fingers): None, normal Lower (legs, knees, ankles, toes): None, normal, Trunk Movements Neck, shoulders, hips: None, normal, Overall Severity Severity of abnormal movements (highest score from questions above): None, normal Incapacitation due to abnormal movements: None, normal Patient's awareness of abnormal movements (rate only patient's report): No Awareness, Dental Status Current problems with teeth and/or dentures?: No Does patient usually wear dentures?: No  CIWA:    COWS:     Musculoskeletal: Strength & Muscle Tone: within normal limits Gait & Station: normal Patient leans: N/A   Psychiatric Specialty Exam: General Appearance: Fairly Groomed  Patent attorney::  Good  Speech:  Clear and Coherent and Normal Rate  Volume:  Normal  Mood:  Euthymic  Affect:  Congruent and Full Range  Thought Process:  Coherent and Linear  Orientation:  Full (Time, Place, and Person)  Thought Content:  Logical  Suicidal Thoughts:  No  Homicidal Thoughts:  No  Memory:  Immediate;   Fair Recent;   Fair Remote;   Fair  Judgement:  Intact  Insight:  Fair  Psychomotor Activity:  Normal  Concentration:  Fair  Recall:  [employer] of Knowledge:Fair  Language: Fair  Akathisia:  Negative  Handed:  Right  AIMS (if indicated):     Assets:  Communication Skills Desire for Improvement Housing Leisure Time Physical  Health Resilience Social Support  Sleep:  Number of Hours: 4.5  Cognition: WNL  ADL's:  Intact     Physical Exam: Physical Exam Vitals and nursing note reviewed.  Constitutional:      Appearance: Normal appearance.  HENT:     Head: Normocephalic and atraumatic.     Right Ear: External ear normal.     Left Ear: External ear normal.     Nose: Nose normal.     Mouth/Throat:     Mouth: Mucous membranes are moist.     Pharynx: Oropharynx is clear.  Eyes:     Extraocular Movements: Extraocular movements intact.     Conjunctiva/sclera: Conjunctivae normal.     Pupils: Pupils are equal, round, and reactive to light.  Cardiovascular:     Rate and Rhythm: Normal rate.     Pulses: Normal pulses.  Pulmonary:     Effort: Pulmonary effort is normal.     Breath sounds: Normal breath sounds.  Abdominal:     General: Abdomen is flat.     Palpations: Abdomen is soft.  Musculoskeletal:        General: No swelling. Normal range  of motion.     Cervical back: Normal range of motion and neck supple.  Skin:    General: Skin is warm and dry.  Neurological:     General: No focal deficit present.     Mental Status: He is alert and oriented to person, place, and time.  Psychiatric:        Mood and Affect: Mood normal.        Behavior: Behavior normal.        Thought Content: Thought content normal.        Judgment: Judgment normal.    Review of Systems  Constitutional: Negative for activity change and fatigue.  HENT: Negative for rhinorrhea and sore throat.   Eyes: Negative for photophobia and visual disturbance.  Respiratory: Negative for cough and shortness of breath.   Cardiovascular: Negative for chest pain and palpitations.  Gastrointestinal: Negative for constipation, diarrhea, nausea and vomiting.  Endocrine: Negative for cold intolerance and heat intolerance.  Genitourinary: Negative for difficulty urinating and dysuria.  Musculoskeletal: Negative for arthralgias and myalgias.   Skin: Negative for rash and wound.  Allergic/Immunologic: Positive for environmental allergies. Negative for food allergies.  Neurological: Negative for dizziness and headaches.  Hematological: Negative for adenopathy. Does not bruise/bleed easily.  Psychiatric/Behavioral: Negative for agitation, behavioral problems, hallucinations, sleep disturbance and suicidal ideas. The patient is not nervous/anxious.   Blood pressure 139/69, pulse 91, temperature 98.6 F (37 C), temperature source Oral, resp. rate 18, height 6\' 4"  (1.93 m), weight 97.1 kg, SpO2 100 %. Body mass index is 26.05 kg/m.   Have you used any form of tobacco in the last 30 days? (Cigarettes, Smokeless Tobacco, Cigars, and/or Pipes): No  Has this patient used any form of tobacco in the last 30 days? (Cigarettes, Smokeless Tobacco, Cigars, and/or Pipes) No  Blood Alcohol level:  Lab Results  Component Value Date   ETH <10 09/29/2020   ETH <10 08/19/2020    Metabolic Disorder Labs:  Lab Results  Component Value Date   HGBA1C 5.6 10/02/2020   MPG 114.02 10/02/2020   MPG 108 08/21/2020   Lab Results  Component Value Date   PROLACTIN 33.2 (H) 08/15/2016   Lab Results  Component Value Date   CHOL 147 10/02/2020   TRIG 39 10/02/2020   HDL 43 10/02/2020   CHOLHDL 3.4 10/02/2020   VLDL 8 10/02/2020   LDLCALC 96 10/02/2020   LDLCALC 107 (H) 08/21/2020    See Psychiatric Specialty Exam and Suicide Risk Assessment completed by Attending Physician prior to discharge.  Discharge destination:  Other: Shelter  Is patient on multiple antipsychotic therapies at discharge:  No   Has Patient had three or more failed trials of antipsychotic monotherapy by history:  No  Recommended Plan for Multiple Antipsychotic Therapies: NA  Discharge Instructions    Diet general   Complete by: As directed    Increase activity slowly   Complete by: As directed      Allergies as of 10/08/2020      Reactions   Haldol [haloperidol  Lactate] Other (See Comments)   Muscle stiffness   Penicillins Hives   Felt like throat was closing Has patient had a PCN reaction causing immediate rash, facial/tongue/throat swelling, SOB or lightheadedness with hypotension: Unknown Has patient had a PCN reaction causing severe rash involving mucus membranes or skin necrosis: unknown Has patient had a PCN reaction that required hospitalization Unknown Has patient had a PCN reaction occurring within the last 10 years: Unknown If all  of the above answers are "NO", then may proceed with Cephalosporin use.   Zyprexa [olanzapine]    eps      Medication List    TAKE these medications     Indication  divalproex 500 MG DR tablet Commonly known as: DEPAKOTE Take 1 tablet (500 mg total) by mouth every 12 (twelve) hours.  Indication: Schizophrenia   FLUoxetine 40 MG capsule Commonly known as: PROZAC Take 1 capsule (40 mg total) by mouth daily.  Indication: Depression   prazosin 1 MG capsule Commonly known as: MINIPRESS Take 1 capsule (1 mg total) by mouth at bedtime.  Indication: Frightening Dreams   QUEtiapine 400 MG tablet Commonly known as: SEROQUEL Take 1 tablet (400 mg total) by mouth at bedtime. What changed:   medication strength  how much to take  Indication: Schizophrenia   QUEtiapine 50 MG tablet Commonly known as: SEROQUEL Take 1 tablet (50 mg total) by mouth 2 (two) times daily. What changed: You were already taking a medication with the same name, and this prescription was added. Make sure you understand how and when to take each.  Indication: Schizophrenia   traZODone 50 MG tablet Commonly known as: DESYREL Take 1 tablet (50 mg total) by mouth at bedtime as needed for sleep.  Indication: Trouble Sleeping       Follow-up Information    [facility] Follow up.   Why: Lorella Nimrod will pick you up at 7am on Monday, 10/13/20 to arrange peer support and outpatient services. You will need to call him to  give him your address and phone number prior to this date to make sure he has the address and your number. Thanks! Contact information: [address] [place] [address] [phone]               Follow-up recommendations:  Activity:  as tolerated Diet:  regular diet  Comments:  7-day supply of free medications given to patient along with printed 30-day scripts with 1 refill  Signed: Jesse Sans, MD 10/08/2020, 9:28 AM

## 2020-10-08 NOTE — BHH Suicide Risk Assessment (Signed)
[facility] Discharge Suicide Risk Assessment   Principal Problem: Schizoaffective disorder, depressive type (HCC) Discharge Diagnoses: Principal Problem:   Schizoaffective disorder, depressive type (HCC) Active Problems:   Cannabis use disorder, moderate, dependence (HCC)   Tobacco use disorder   Benzodiazepine abuse (HCC)   Total Time spent with patient: 35 minutes- 25 minutes face-to-face contact with patient, 10 minutes documentation, coordination of care, scripts   Musculoskeletal: Strength & Muscle Tone: within normal limits Gait & Station: normal Patient leans: N/A  Psychiatric Specialty Exam: Review of Systems  Constitutional: Negative for activity change and fatigue.  HENT: Negative for rhinorrhea and sore throat.   Eyes: Negative for photophobia and visual disturbance.  Respiratory: Negative for cough and shortness of breath.   Cardiovascular: Negative for chest pain and palpitations.  Gastrointestinal: Negative for constipation, diarrhea, nausea and vomiting.  Endocrine: Negative for cold intolerance and heat intolerance.  Genitourinary: Negative for difficulty urinating and dysuria.  Musculoskeletal: Negative for arthralgias and myalgias.  Skin: Negative for rash and wound.  Allergic/Immunologic: Positive for environmental allergies. Negative for food allergies.  Neurological: Negative for dizziness and headaches.  Hematological: Negative for adenopathy. Does not bruise/bleed easily.  Psychiatric/Behavioral: Negative for agitation, behavioral problems, hallucinations, sleep disturbance and suicidal ideas. The patient is not nervous/anxious.     Blood pressure 139/69, pulse 91, temperature 98.6 F (37 C), temperature source Oral, resp. rate 18, height 6\' 4"  (1.93 m), weight 97.1 kg, SpO2 100 %.Body mass index is 26.05 kg/m.  General Appearance: Fairly Groomed  ::  Good  Speech:  Clear and Coherent and Normal Rate  Volume:  Normal  Mood:  Euthymic  Affect:   Congruent and Full Range  Thought Process:  Coherent and Linear  Orientation:  Full (Time, Place, and Person)  Thought Content:  Logical  Suicidal Thoughts:  No  Homicidal Thoughts:  No  Memory:  Immediate;   Fair Recent;   Fair Remote;   Fair  Judgement:  Intact  Insight:  Fair  Psychomotor Activity:  Normal  Concentration:  Fair  Recall:  [phone] of Knowledge:Fair  Language: Fair  Akathisia:  Negative  Handed:  Right  AIMS (if indicated):     Assets:  Communication Skills Desire for Improvement Housing Leisure Time Physical Health Resilience Social Support  Sleep:  Number of Hours: 4.5  Cognition: WNL  ADL's:  Intact   Mental Status Per Nursing Assessment::   On Admission:  Self-harm thoughts  Demographic Factors:  Male, Adolescent or young adult and Unemployed  Loss Factors: Legal issues  Historical Factors: NA  Risk Reduction Factors:   Sense of responsibility to family, Religious beliefs about death, Living with another person, especially a relative, Positive social support, Positive therapeutic relationship and Positive coping skills or problem solving skills  Continued Clinical Symptoms:  Schizophrenia:   Paranoid or undifferentiated type Previous Psychiatric Diagnoses and Treatments  Cognitive Features That Contribute To Risk:  None    Suicide Risk:  Minimal: No identifiable suicidal ideation.  Patients presenting with no risk factors but with morbid ruminations; may be classified as minimal risk based on the severity of the depressive symptoms   Follow-up Information    [facility] Follow up.   Why: [phone] will pick you up at 7am on Monday, 10/13/20 to arrange peer support and outpatient services. You will need to call him to give him your address and phone number prior to this date to make sure he has the address and your number.  Thanks! Contact information: [address] [place] [address] [phone]                Plan Of Care/Follow-up recommendations:  Activity:  as tolerated Diet:  regular diet  Jesse Sans, MD 10/08/2020, 9:06 AM

## 2020-10-08 NOTE — Progress Notes (Signed)
Recreation Therapy Notes  INPATIENT RECREATION TR PLAN  Patient Details Name: Timothy [name] MRN: [id number] DOB: Aug 11, 1995 Today's Date: 10/08/2020  Rec Therapy Plan Is patient appropriate for Therapeutic Recreation?: Yes Treatment times per week: at least 3 Estimated Length of Stay: 5-7 days TR Treatment/Interventions: Group participation (Comment)  Discharge Criteria Pt will be discharged from therapy if:: Discharged Treatment plan/goals/alternatives discussed and agreed upon by:: Patient/family  Discharge Summary Short term goals set: Patient will identify 3 positive coping skills strategies to use post d/c within 5 recreation therapy group sessions Short term goals met: Complete Progress toward goals comments: Groups attended Which groups?: Communication,Goal setting,Stress management,Social skills Reason goals not met: N/A Therapeutic equipment acquired: N/A Reason patient discharged from therapy: Discharge from hospital Pt/family agrees with progress & goals achieved: Yes Date patient discharged from therapy: 10/08/20   Lorren Rossetti 10/08/2020, 12:41 PM

## 2020-10-08 NOTE — Progress Notes (Signed)
Recreation Therapy Notes    Date: 10/08/2020  Time: 9:30 am  Location: Craft room   Behavioral response: Appropriate   Intervention Topic: Communication  Discussion/Intervention:  Group content today was focused on communication. The group defined communication and ways to communicate with others. Individuals stated reason why communication is important and some reasons to communicate with others. Patients expressed if they thought they were good at communicating with others and ways they could improve their communication skills. The group identified important parts of communication and some experiences they have had in the past with communication. The group participated in the intervention "What is that?", where they had a chance to test out their communication skills and identify ways to improve their communication techniques.  Clinical Observations/Feedback:  Patient came to group and identified communication as relationships and dating. He stated that communication is important to get to know others. Individual was social with peers and staff while participating in the intervention.   Nashayla Telleria LRT/CTRS             Marielis Samara 10/08/2020 12:38 PM

## 2020-10-08 NOTE — Progress Notes (Signed)
  [facility] Adult Case Management Discharge Plan :  Will you be returning to the same living situation after discharge:  Yes,  patient to return to uncle's home  At discharge, do you have transportation home?: Yes,  CSW to assist with transportation  Do you have the ability to pay for your medications: No.  Release of information consent forms completed and in the chart;  Patient's signature needed at discharge.  Patient to Follow up at:  Follow-up Information    [facility] Follow up.   Why: Lorella Nimrod will pick you up at 7am on Monday, 10/13/20 to arrange peer support and outpatient services. You will need to call him to give him your address and phone number prior to this date to make sure he has the address and your number. Thanks! Contact information: [address] [place] [address] [phone]               Next level of care provider has access to [facility] Link:no  Safety Planning and Suicide Prevention discussed: Yes,  SPE completed with patient and family/friend  Have you used any form of tobacco in the last 30 days? (Cigarettes, Smokeless Tobacco, Cigars, and/or Pipes): No  Has patient been referred to the Quitline?: Patient refused referral  Patient has been referred for addiction treatment: N/A  Corky Crafts, LCSWA 10/08/2020, 9:57 AM

## 2020-11-06 ENCOUNTER — Emergency Department
Admission: EM | Admit: 2020-11-06 | Discharge: 2020-11-06 | Disposition: A | Discharge status: Home / Self Care | Payer: 59 | Attending: Emergency Medicine | Admitting: Emergency Medicine

## 2020-11-06 ENCOUNTER — Other Ambulatory Visit: Payer: Self-pay

## 2020-11-06 ENCOUNTER — Encounter: Payer: Self-pay | Admitting: Emergency Medicine

## 2020-11-06 DIAGNOSIS — F17200 Nicotine dependence, unspecified, uncomplicated: Secondary | ICD-10-CM | POA: Insufficient documentation

## 2020-11-06 DIAGNOSIS — Z79899 Other long term (current) drug therapy: Secondary | ICD-10-CM | POA: Insufficient documentation

## 2020-11-06 DIAGNOSIS — F251 Schizoaffective disorder, depressive type: Secondary | ICD-10-CM | POA: Diagnosis not present

## 2020-11-06 DIAGNOSIS — Z5900 Homelessness unspecified: Secondary | ICD-10-CM | POA: Insufficient documentation

## 2020-11-06 DIAGNOSIS — Z20822 Contact with and (suspected) exposure to covid-19: Secondary | ICD-10-CM | POA: Insufficient documentation

## 2020-11-06 DIAGNOSIS — R443 Hallucinations, unspecified: Secondary | ICD-10-CM

## 2020-11-06 LAB — RESP PANEL BY RT-PCR (FLU A&B, COVID) ARPGX2
Influenza A by PCR: NEGATIVE
Influenza B by PCR: NEGATIVE
SARS Coronavirus 2 by RT PCR: NEGATIVE

## 2020-11-06 LAB — COMPREHENSIVE METABOLIC PANEL
ALT: 20 U/L (ref 0–44)
AST: 26 U/L (ref 15–41)
Albumin: 4.2 g/dL (ref 3.5–5.0)
Alkaline Phosphatase: 44 U/L (ref 38–126)
Anion gap: 7 (ref 5–15)
BUN: 8 mg/dL (ref 6–20)
CO2: 26 mmol/L (ref 22–32)
Calcium: 9.1 mg/dL (ref 8.9–10.3)
Chloride: 103 mmol/L (ref 98–111)
Creatinine, Ser: 1.06 mg/dL (ref 0.61–1.24)
GFR, Estimated: >60 mL/min (ref >60)
Glucose, Bld: 100 mg/dL — ABNORMAL HIGH (ref 70–99)
Potassium: 4 mmol/L (ref 3.5–5.1)
Sodium: 136 mmol/L (ref 135–145)
Total Bilirubin: 1.3 mg/dL — ABNORMAL HIGH (ref 0.3–1.2)
Total Protein: 7.3 g/dL (ref 6.5–8.1)

## 2020-11-06 LAB — CBC
HCT: 44.3 % (ref 39.0–52.0)
Hemoglobin: 14.8 g/dL (ref 13.0–17.0)
MCH: 30.6 pg (ref 26.0–34.0)
MCHC: 33.4 g/dL (ref 30.0–36.0)
MCV: 91.5 fL (ref 80.0–100.0)
Platelets: 186 10*3/uL (ref 150–400)
RBC: 4.84 MIL/uL (ref 4.22–5.81)
RDW: 13.5 % (ref 11.5–15.5)
WBC: 3.9 10*3/uL — ABNORMAL LOW (ref 4.0–10.5)
nRBC: 0 % (ref 0.0–0.2)

## 2020-11-06 LAB — URINE DRUG SCREEN, QUALITATIVE (ARMC ONLY)
Amphetamines, Ur Screen: NOT DETECTED
Barbiturates, Ur Screen: NOT DETECTED
Benzodiazepine, Ur Scrn: NOT DETECTED
Cannabinoid 50 Ng, Ur ~~LOC~~: NOT DETECTED
Cocaine Metabolite,Ur ~~LOC~~: NOT DETECTED
MDMA (Ecstasy)Ur Screen: NOT DETECTED
Methadone Scn, Ur: NOT DETECTED
Opiate, Ur Screen: NOT DETECTED
Phencyclidine (PCP) Ur S: NOT DETECTED
Tricyclic, Ur Screen: NOT DETECTED

## 2020-11-06 LAB — ETHANOL: Alcohol, Ethyl (B): <10 mg/dL (ref <10)

## 2020-11-06 LAB — SALICYLATE LEVEL: Salicylate Lvl: <7 mg/dL — ABNORMAL LOW (ref 7.0–30.0)

## 2020-11-06 LAB — ACETAMINOPHEN LEVEL: Acetaminophen (Tylenol), Serum: <10 ug/mL — ABNORMAL LOW (ref 10–30)

## 2020-11-06 LAB — VALPROIC ACID LEVEL: Valproic Acid Lvl: <10 ug/mL — ABNORMAL LOW (ref 50.0–100.0)

## 2020-11-06 MED ORDER — PRAZOSIN HCL 1 MG PO CAPS: 1.0000 mg | ORAL_CAPSULE | Freq: Every day | ORAL | 1 refills | Status: DC

## 2020-11-06 MED ORDER — QUETIAPINE FUMARATE 400 MG PO TABS: 400.0000 mg | ORAL_TABLET | Freq: Every day | ORAL | 1 refills | Status: DC

## 2020-11-06 MED ORDER — DIVALPROEX SODIUM 500 MG PO DR TAB: 500.0000 mg | DELAYED_RELEASE_TABLET | Freq: Two times a day (BID) | ORAL | 1 refills | Status: DC

## 2020-11-06 MED ORDER — QUETIAPINE FUMARATE 50 MG PO TABS: 50.0000 mg | ORAL_TABLET | Freq: Two times a day (BID) | ORAL | 1 refills | Status: DC

## 2020-11-06 MED ORDER — FLUOXETINE HCL 40 MG PO CAPS: 40.0000 mg | ORAL_CAPSULE | Freq: Every day | ORAL | 1 refills | Status: DC

## 2020-11-06 NOTE — Discharge Instructions (Addendum)
Cleared by psych for discharge  

## 2020-11-06 NOTE — ED Notes (Addendum)
covid swab sent to lab at this time.  

## 2020-11-06 NOTE — ED Notes (Signed)
Patient transferred from Triage to room 20 after dressing out and screening for contraband. Report received from Jeannett Senior, RN including situation, background, assessment and recommendations. Pt oriented to [employer] including Q15 minute rounds as well as Psychologist, counselling for their protection. Patient is alert and oriented, warm and dry in no acute distress. Patient denies SI, HI, and AH. Pt. Encouraged to let this nurse know if needs arise.

## 2020-11-06 NOTE — TOC Transition Note (Signed)
Transition of Care [facility]) - CM/SW Discharge Note   Patient Details  Name: JAZIR NEWEY MRN: [id number] Date of Birth: [birth year]  Transition of Care [facility] CM/SW Contact:  Joseph Art, LCSWA Phone Number: 11/06/2020, 3:07 PM   Clinical Narrative:     Patient will d/c to [facility].  CSW unable to find shelter placement in [place].  Patient will transport via [employer]  ETA 3-45 minutes.  EDP/ED Staff updated.  Patient signed rider waiver and it was emailed to [employer].  Final next [facility] care: [facility] Barriers to Discharge: No Barriers Identified   Patient Goals and CMS Choice        Discharge Placement                       Discharge Plan and Services                                     Social Determinants of Health (SDOH) Interventions     Readmission Risk Interventions No flowsheet data found.

## 2020-11-06 NOTE — ED Triage Notes (Signed)
Pt to ED c/o hallucinations for a couple days.  States was kicked out of his house then, denies drugs or alcohol.  Hx of schizophrenia and having visual hallucinations of shadows and things crawling across walls.  Denies SI/HI.  Pt changed into hospital appropriate scrubs.  Contents into 1 bag, contents include: 1 pair brown shoes, 1 lighter, 1 vial, 2 cell phones, 1 hard wallet, 1 pair white socks, 1 pair boxers, 1 pair jeans, 1 red shirt, 1 black tank top.
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
## 2020-11-06 NOTE — ED Notes (Signed)
Pt reports no change in [facility] since last visit. States he is unable to differentiate from real and hallucinations. Reports taking medications as prescribed.

## 2020-11-06 NOTE — ED Provider Notes (Signed)
[facility] Emergency Department Provider Note  ____________________________________________   Event Date/Time   First MD Initiated Contact with Patient 11/06/20 [phone]     (approximate)  I have reviewed the triage vital signs and the nursing notes.   HISTORY  Chief Complaint Psychiatric Evaluation and Hallucinations    HPI Timothy [name] is a 25 y.o. male with history of schizophrenia who comes in with concerns for hallucination for the past few days.  Patient reports seeing shadows and things crawling on the walls.  He also reports hearing whispers.  He denies any command hallucinations.  He states that this has been going on for a while but seems to be worsening over the last few days.  Does report that he gets better when he has been hospitalized previously.  He states that he has been compliant with all of his medications.  Denies SI, HI.  Denies alcohol or drug use.  Denies any other medical concerns          Past Medical History:  Diagnosis Date  . Cannabis abuse   . Paranoid schizophrenia (HCC)   . Schizoaffective disorder, depressive type (HCC) 09/17/2014    Patient Active Problem List   Diagnosis Date Noted  . Benzodiazepine abuse (HCC) 08/13/2016  . Schizoaffective disorder, depressive type (HCC) 08/12/2016  . Schizoaffective disorder, bipolar type (HCC) 09/18/2014  . Tobacco use disorder 09/18/2014  . 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 Cannabis use disorder, moderate, dependence (HCC) 09/17/2014    History reviewed. No pertinent surgical history.  Prior to Admission medications   Medication Sig Start Date End Date Taking? Authorizing Provider  divalproex (DEPAKOTE) 500 MG DR tablet TAKE 1 TABLET BY MOUTH EVERY 12 HOURS. 08/25/20 08/25/21  Jesse Sans, MD  divalproex (DEPAKOTE) 500 MG DR tablet Take 1 tablet (500 mg total) by mouth every 12 (twelve) hours. 10/08/20   Jesse Sans, MD  FLUoxetine (PROZAC) 40 MG  capsule Take 1 capsule (40 mg total) by mouth daily. 10/08/20   Jesse Sans, MD  prazosin (MINIPRESS) 1 MG capsule Take 1 capsule (1 mg total) by mouth at bedtime. 10/08/20   Jesse Sans, MD  QUEtiapine (SEROQUEL) 300 MG tablet TAKE 2 TABLETS (600 MG) BY MOUTH AT BEDTIME. 08/25/20 08/25/21  Jesse Sans, MD  QUEtiapine (SEROQUEL) 400 MG tablet Take 1 tablet (400 mg total) by mouth at bedtime. 10/08/20   Jesse Sans, MD  QUEtiapine (SEROQUEL) 50 MG tablet Take 1 tablet (50 mg total) by mouth 2 (two) times daily. 10/08/20   Jesse Sans, MD  traZODone (DESYREL) 50 MG tablet TAKE 1 TABLET BY MOUTH AT BEDTIME AS NEEDED FOR SLEEP. 08/25/20 08/25/21  Jesse Sans, MD  traZODone (DESYREL) 50 MG tablet Take 1 tablet (50 mg total) by mouth at bedtime as needed for sleep. 10/08/20   Jesse Sans, MD    Allergies Haldol [haloperidol lactate], Penicillins, and Zyprexa [olanzapine]  Family History  Problem Relation Age of Onset  . Mental illness Brother   . Drug abuse Brother   . Mental illness Cousin   . Suicidality Cousin   . Diabetes Maternal Grandmother     Social History Social History   Tobacco Use  . Smoking status: Current Some Day Smoker    Packs/day: 0.25  . Smokeless tobacco: Never Used  Vaping Use  . Vaping Use: Never used  Substance Use Topics  . Alcohol use: Yes    Alcohol/week: 2.0 standard drinks  Types: 2 Cans of beer per week  . Drug use: Yes    Types: Marijuana, MDMA (Ecstacy)      Review of Systems Constitutional: No fever/chills Eyes: No visual changes. ENT: No sore throat. Cardiovascular: Denies chest pain. Respiratory: Denies shortness of breath. Gastrointestinal: No abdominal pain.  No nausea, no vomiting.  No diarrhea.  No constipation. Genitourinary: Negative for dysuria. Musculoskeletal: Negative for back pain. Skin: Negative for rash. Neurological: Negative for headaches, focal weakness or numbness. Psych: Hallucinations All other  ROS negative ____________________________________________   PHYSICAL EXAM:  VITAL SIGNS: ED Triage Vitals  Enc Vitals Group     BP 11/06/20 0612 114/65     Pulse Rate 11/06/20 0612 60     Resp 11/06/20 0612 16     Temp 11/06/20 0612 98.5 F (36.9 C)     Temp Source 11/06/20 0612 Oral     SpO2 11/06/20 0612 99 %     Weight 11/06/20 0613 213 lb 13.5 oz (97 kg)     Height 11/06/20 0613 6\' 4"  (1.93 m)     Head Circumference --      Peak Flow --      Pain Score 11/06/20 0613 10     Pain Loc --      Pain Edu? --      Excl. in GC? --     Constitutional: Alert and oriented. Well appearing and in no acute distress. Eyes: Conjunctivae are normal. No swelling around eyes Head: Atraumatic. Nose: No congestion/rhinnorhea. Mouth/Throat: Mucous membranes are moist.   Neck: No stridor. Trachea Midline. FROM Cardiovascular: Normal rate, no swelling noted Respiratory: No increased wob, no stridor Gastrointestinal: Soft and nontender. No distention. No abdominal bruits.  Musculoskeletal: No lower extremity tenderness nor edema.  No joint effusions. Neurologic:  Normal speech and language. No gross focal neurologic deficits are appreciated.  Skin:  Skin is warm, dry and intact. No rash noted. Psychiatric: Positive AH, VH, no SI or HI GU: Deferred   ____________________________________________   LABS (all labs ordered are listed, but only abnormal results are displayed)  Labs Reviewed  COMPREHENSIVE METABOLIC PANEL - Abnormal; Notable for the following components:      Result Value   Glucose, Bld 100 (*)    Total Bilirubin 1.3 (*)    All other components within normal limits  SALICYLATE LEVEL - Abnormal; Notable for the following components:   Salicylate Lvl <7.0 (*)    All other components within normal limits  ACETAMINOPHEN LEVEL - Abnormal; Notable for the following components:   Acetaminophen (Tylenol), Serum <10 (*)    All other components within normal limits  CBC -  Abnormal; Notable for the following components:   WBC 3.9 (*)    All other components within normal limits  URINE DRUG SCREEN, QUALITATIVE ([facility] ONLY)  ETHANOL   ____________________________________________   INITIAL IMPRESSION / ASSESSMENT AND PLAN / ED COURSE  Timothy [name] was evaluated in Emergency Department on 11/06/2020 for the symptoms described in the history of present illness. He was evaluated in the context of the global COVID-19 pandemic, which necessitated consideration that the patient might be at risk for infection with the SARS-CoV-2 virus that causes COVID-19. Institutional protocols and algorithms that pertain to the evaluation of patients at risk for COVID-19 are in a state of rapid change based on information released by regulatory bodies including the CDC and federal and state organizations. These policies and algorithms were followed during the patient's care in the ED.  Pt is without any acute medical complaints. No exam findings to suggest medical cause of current presentation. Will order psychiatric screening labs and discuss further w/ psychiatric service.  Patient does not meet IVC criteria at this time  White count slightly low but denied infectious symptoms  D/d includes but is not limited to psychiatric disease, behavioral/personality disorder, inadequate socioeconomic support, medical.  Based on HPI, exam, unremarkable labs, no concern for acute medical problem at this time. No rigidity, clonus, hyperthermia, focal neurologic deficit, diaphoresis, tachycardia, meningismus, ataxia, gait abnormality or other finding to suggest this visit represents a non-psychiatric problem. Screening labs reviewed.    Given this, pt medically cleared, to be dispositioned per Psych.    The patient has been placed in psychiatric observation due to the need to provide a safe environment for the patient while obtaining psychiatric consultation and evaluation, as well as ongoing  medical and medication management to treat the patient's condition.  The patient has not been placed under full IVC at this time.        ____________________________________________   FINAL CLINICAL IMPRESSION(S) / ED DIAGNOSES   Final diagnoses:  Hallucinations      MEDICATIONS GIVEN DURING THIS VISIT:  Medications - No data to display   ED Discharge Orders    None       Note:  This document was prepared using Dragon voice recognition software and may include unintentional dictation errors.   Concha Se, MD 11/06/20 [phone]

## 2020-11-06 NOTE — TOC Initial Note (Signed)
Transition of Care [facility]) - Progression Note    Patient Details  Name: Timothy [name] MRN: [id number] Date of Birth: 11/08/1995  Transition of Care [facility]) CM/SW Contact  Marina Goodell Phone Number: [phone] 11/06/2020, 2:43 PM  Clinical Narrative:     Patient presents to [facility] due to homelessness and active visual hallucinations.  Patient has been psych and medically cleared.  CSW spoke with patient's mother Dani Gobble (Mother) [phone], who stated the patient was not allowed back in the home because the patient "jumped on his disabled uncle and now there is a warrant out for his arrest."  CSW verbalized understanding.  CSW reached out to [facility] and to [employer] but was unable to speak to someone about placement.  CSW contacted [facility] and was told by manager there were no beds available.  CSW spoke with patient and he agreed to go to [facility] in [place] and try to see if there was availability for a bed tonight.  CSW updated EDP /ED RN and Consulting civil engineer on discharge plan.  CSW contacted [employer] for transportation arrangements to [facility], they will ETA 30-45 minutes   [facility]charge [facility]: [facility] Barriers to Discharge: No Barriers Identified  Expected Discharge Plan and Services Expected Discharge Plan: [employer]                                               Social Determinants of Health (SDOH) Interventions    Readmission Risk Interventions No flowsheet data found.

## 2021-01-04 IMAGING — DX DG HUMERUS 2V *L*
3 series · 3 of 3 positions shown · non-contrast
Comparison: None.

CLINICAL DATA: Status post trauma.

EXAM:
LEFT HUMERUS - 2+ VIEW

[humerus ap (1 of 2)]
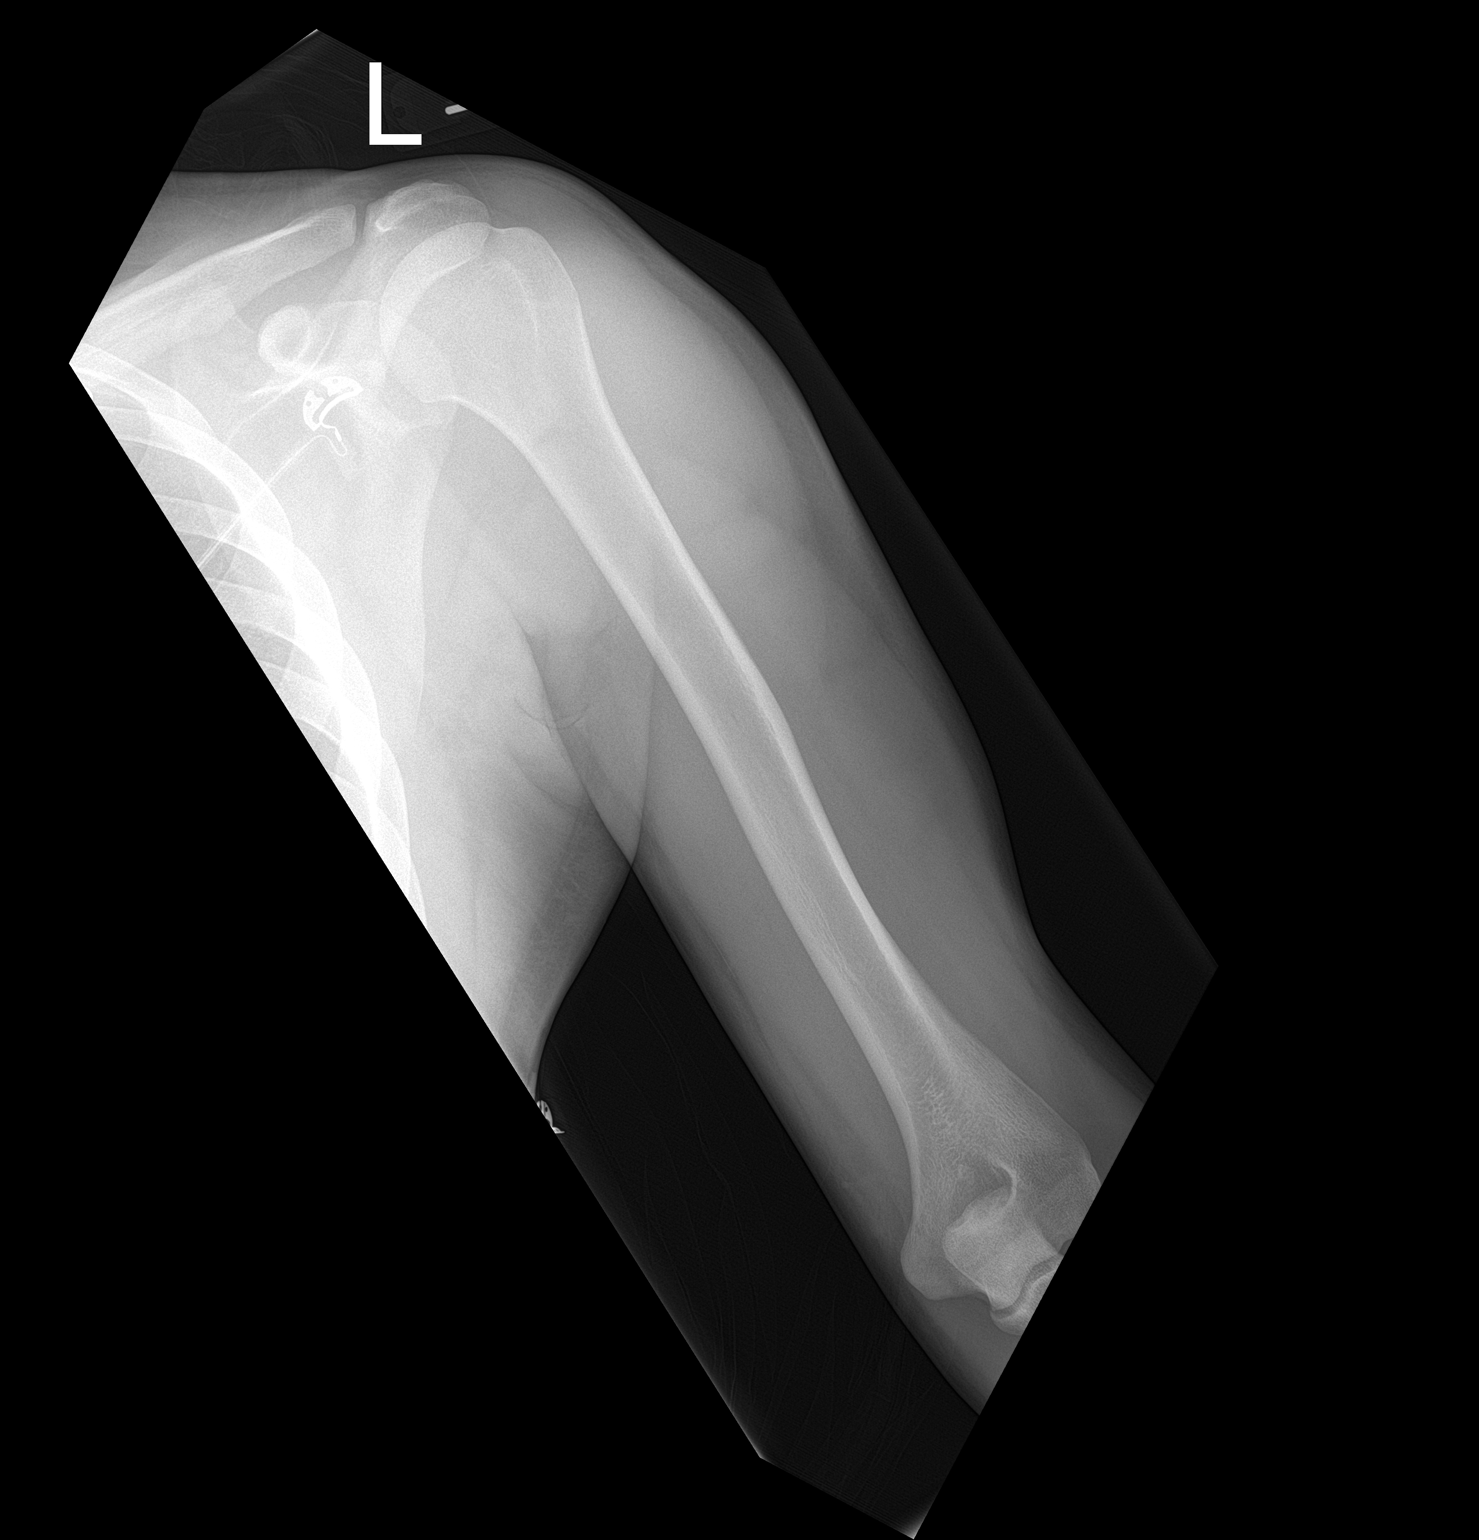

[humerus lat]
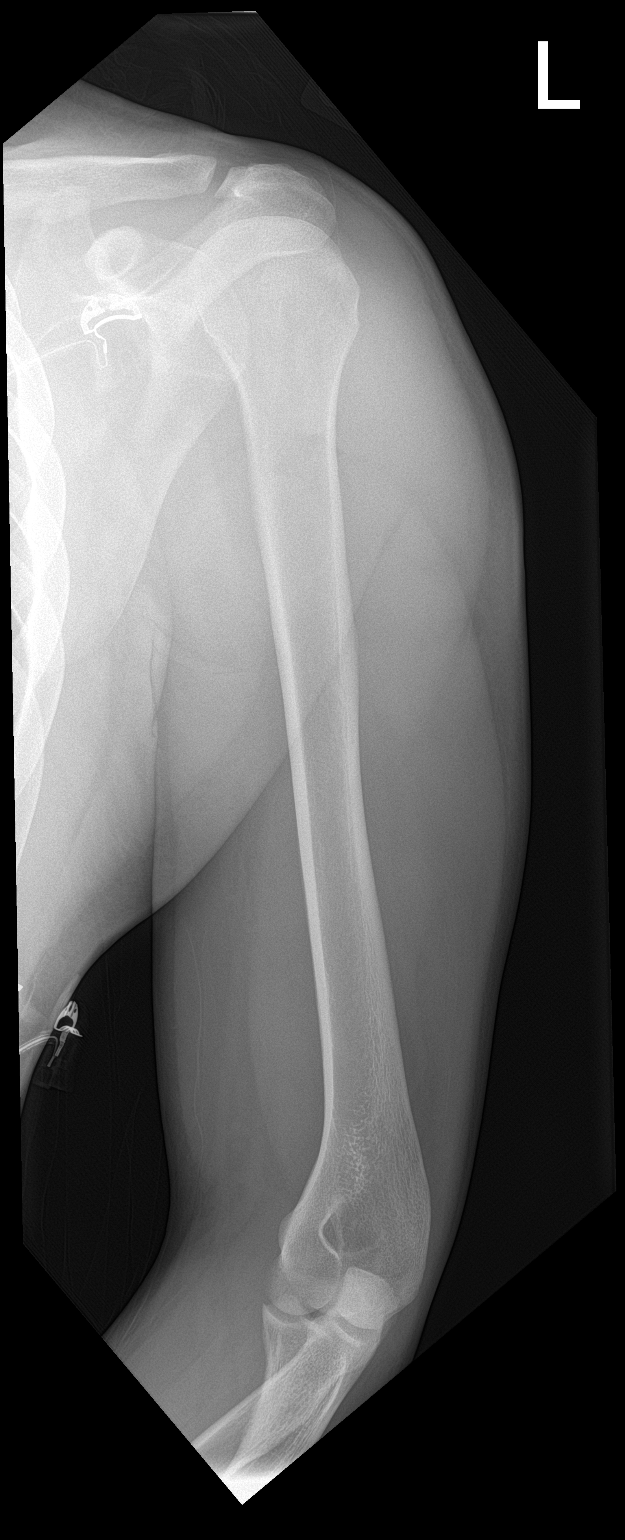

[humerus ap (2 of 2)]
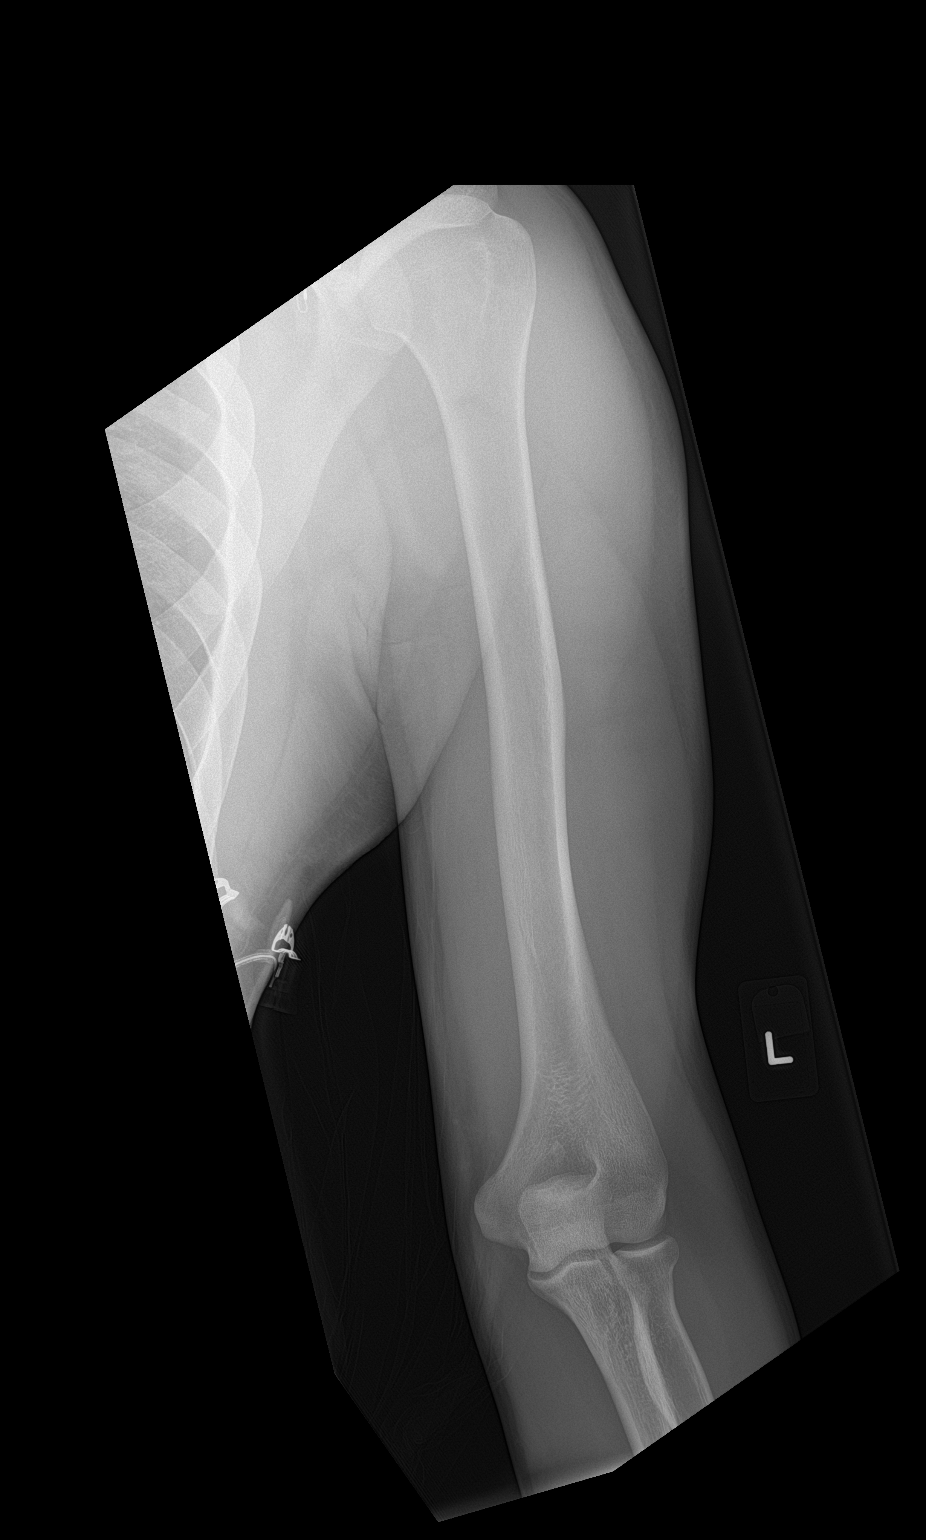

[3 of 3 positions shown; findings below may reference images not displayed]

FINDINGS: There is no evidence of fracture or other focal bone lesions. Mild
soft tissue swelling is seen along the lateral aspect of proximal
left humerus.
IMPRESSION: No acute fracture or dislocation.

## 2021-03-14 ENCOUNTER — Emergency Department (HOSPITAL_COMMUNITY)
Admission: EM | Admit: 2021-03-14 | Discharge: 2021-03-15 | Disposition: A | Discharge status: Home / Self Care | Payer: Self-pay | Attending: Emergency Medicine | Admitting: Emergency Medicine

## 2021-03-14 ENCOUNTER — Emergency Department (HOSPITAL_COMMUNITY): Payer: Self-pay

## 2021-03-14 ENCOUNTER — Encounter (HOSPITAL_COMMUNITY): Payer: Self-pay | Admitting: Emergency Medicine

## 2021-03-14 ENCOUNTER — Other Ambulatory Visit: Payer: Self-pay

## 2021-03-14 DIAGNOSIS — F17210 Nicotine dependence, cigarettes, uncomplicated: Secondary | ICD-10-CM | POA: Insufficient documentation

## 2021-03-14 DIAGNOSIS — Y900 Blood alcohol level of less than 20 mg/100 ml: Secondary | ICD-10-CM | POA: Insufficient documentation

## 2021-03-14 DIAGNOSIS — R55 Syncope and collapse: Secondary | ICD-10-CM | POA: Insufficient documentation

## 2021-03-14 DIAGNOSIS — Z79899 Other long term (current) drug therapy: Secondary | ICD-10-CM | POA: Insufficient documentation

## 2021-03-14 LAB — CBC WITH DIFFERENTIAL/PLATELET
Abs Immature Granulocytes: 0.02 10*3/uL (ref 0.00–0.07)
Basophils Absolute: 0 10*3/uL (ref 0.0–0.1)
Basophils Relative: 1 %
Eosinophils Absolute: 0.1 10*3/uL (ref 0.0–0.5)
Eosinophils Relative: 1 %
HCT: 49.3 % (ref 39.0–52.0)
Hemoglobin: 16 g/dL (ref 13.0–17.0)
Immature Granulocytes: 1 %
Lymphocytes Relative: 29 %
Lymphs Abs: 1.2 10*3/uL (ref 0.7–4.0)
MCH: 30.5 pg (ref 26.0–34.0)
MCHC: 32.5 g/dL (ref 30.0–36.0)
MCV: 94.1 fL (ref 80.0–100.0)
Monocytes Absolute: 0.6 10*3/uL (ref 0.1–1.0)
Monocytes Relative: 14 %
Neutro Abs: 2.4 10*3/uL (ref 1.7–7.7)
Neutrophils Relative %: 54 %
Platelets: 241 10*3/uL (ref 150–400)
RBC: 5.24 MIL/uL (ref 4.22–5.81)
RDW: 13.1 % (ref 11.5–15.5)
WBC: 4.3 10*3/uL (ref 4.0–10.5)
nRBC: 0 % (ref 0.0–0.2)

## 2021-03-14 LAB — COMPREHENSIVE METABOLIC PANEL
ALT: 25 U/L (ref 0–44)
AST: 24 U/L (ref 15–41)
Albumin: 4 g/dL (ref 3.5–5.0)
Alkaline Phosphatase: 40 U/L (ref 38–126)
Anion gap: 10 (ref 5–15)
BUN: 14 mg/dL (ref 6–20)
CO2: 23 mmol/L (ref 22–32)
Calcium: 9.2 mg/dL (ref 8.9–10.3)
Chloride: 102 mmol/L (ref 98–111)
Creatinine, Ser: 1.21 mg/dL (ref 0.61–1.24)
GFR, Estimated: >60 mL/min (ref >60)
Glucose, Bld: 113 mg/dL — ABNORMAL HIGH (ref 70–99)
Potassium: 3.6 mmol/L (ref 3.5–5.1)
Sodium: 135 mmol/L (ref 135–145)
Total Bilirubin: 1.9 mg/dL — ABNORMAL HIGH (ref 0.3–1.2)
Total Protein: 7.2 g/dL (ref 6.5–8.1)

## 2021-03-14 LAB — VALPROIC ACID LEVEL: Valproic Acid Lvl: <10 ug/mL — ABNORMAL LOW (ref 50.0–100.0)

## 2021-03-14 LAB — ETHANOL: Alcohol, Ethyl (B): <10 mg/dL (ref <10)

## 2021-03-14 LAB — SALICYLATE LEVEL: Salicylate Lvl: <7 mg/dL — ABNORMAL LOW (ref 7.0–30.0)

## 2021-03-14 NOTE — ED Provider Notes (Signed)
Emergency Medicine Provider Triage Evaluation Note  Timothy [name] , a 25 y.o. male  was evaluated in triage.  Pt complains of altered mental status.  Patient came in by EMS, patient was at work when he suddenly collapsed on the ground.  Striking his head on the back.  CBG was checked and was 61, he was given p.o. help with glucose.  Patient reports he has been "falling out lately ".  Review of Systems  Positive: Headache, LOC Negative: Fever, neck pain  Physical Exam  There were no vitals taken for this visit. Gen:   Awake, no distress   Resp:  Normal effort  MSK:   Moves extremities without difficulty  Other:  Placed in Biomedical engineer by EMS.Pupils are equal and reactive  Medical Decision Making  Medically screening exam initiated at 7:31 PM.  Appropriate orders placed.  [name] was informed that the remainder of the evaluation will be completed by another provider, this initial triage assessment does not replace that evaluation, and the importance of remaining in the ED until their evaluation is complete.  Patient here status post syncopal episode.  Appears to be at baseline now.  Does answer questions adequately.  Prior history of mental health illness.   Claude Manges, PA-C 03/14/21 1934    Maia Plan, MD 03/22/21 1747

## 2021-03-14 NOTE — ED Triage Notes (Signed)
Patient arrived with EMS from work , brief syncopal episode this evening , CBG=61 , received oral glucose prior to arrival , denies pain /respirations unlabored, alert and oriented at arrival .

## 2021-03-15 NOTE — Discharge Instructions (Addendum)
It was our pleasure to provide your ER care today - we hope that you feel better.  Drink plenty of fluids/stay well hydrated.   Follow up with primary care doctor in the next 1-2 weeks.   Return to ER if worse, new symptoms, recurrent fainting, chest pain, trouble breathing, fevers, or other concern.

## 2021-03-15 NOTE — ED Provider Notes (Addendum)
[facility] EMERGENCY DEPARTMENT Provider Note   CSN: [id number] Arrival date & time: 03/14/21  1905     History Chief Complaint  Patient presents with   Syncope     Timothy [name] is a 25 y.o. male.  Patient indicates was at work yesterday, standing at grill when had brief syncopal event. Symptoms acute onset, episodic, now resolved. States felt fine earlier and just prior. Denies associated chest pain or discomfort. No sob. No palpitations or sense of fast or irregular heart beating. Denies other syncopal events. No sob or unusual doe. No fever/chills or sweats. No nausea, vomiting or diarrhea. Denies rectal bleeding, melena, or any recent abnormal bruising or bleeding. Denies recent change in meds or new meds. Post event, currently denies any symptoms or pain - states feels fine. No seizure activity or post-ictal period/confusion noted.   The history is provided by the patient and medical records.      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 No pertinent surgical history.     Family History  Problem Relation Age of Onset   Mental illness Brother    Drug abuse Brother    Mental illness Cousin    Suicidality Cousin    Diabetes Maternal Grandmother     Social History   Tobacco Use   Smoking status: Some Days    Packs/day: 0.25    Types: Cigarettes   Smokeless tobacco: Never  Vaping Use   Vaping Use: Never used  Substance Use  Topics   Alcohol use: Yes    Alcohol/week: 2.0 standard drinks    Types: 2 Cans of beer per week   Drug use: Yes    Types: Marijuana, MDMA (Ecstacy)    Home Medications Prior to Admission medications   Medication Sig Start Date End Date Taking? Authorizing Provider  divalproex (DEPAKOTE) 500 MG DR tablet Take 1 tablet (500 mg total) by mouth every 12 (twelve) hours. 11/06/20   Clapacs, Jackquline Denmark, MD  FLUoxetine (PROZAC) 40 MG capsule Take 1 capsule (40 mg total) by mouth daily. 11/06/20   Clapacs, Jackquline Denmark, MD  prazosin (MINIPRESS) 1 MG capsule Take 1 capsule (1 mg total) by mouth at bedtime. 11/06/20   Clapacs, Jackquline Denmark, MD  QUEtiapine (SEROQUEL) 400 MG tablet Take 1 tablet (400 mg total) by mouth at bedtime. 11/06/20   Clapacs, Jackquline Denmark, MD  QUEtiapine (SEROQUEL) 50 MG tablet Take 1 tablet (50 mg total) by mouth 2 (two) times daily. 11/06/20   Clapacs, Jackquline Denmark, MD  traZODone (DESYREL) 50 MG tablet TAKE 1 TABLET BY MOUTH AT BEDTIME AS NEEDED FOR SLEEP. 08/25/20 08/25/21  Jesse Sans, MD  traZODone (DESYREL) 50 MG tablet Take 1 tablet (50 mg total) by mouth at bedtime as needed for sleep. 10/08/20   Jesse Sans, MD    Allergies    Haldol [haloperidol lactate], Penicillins, and Zyprexa [olanzapine]  Review of Systems   Review of Systems  Constitutional:  Negative for chills and fever.  HENT:  Negative for sore throat.   Eyes:  Negative for visual disturbance.  Respiratory:  Negative for cough and shortness of breath.   Cardiovascular:  Negative for chest pain.  Gastrointestinal:  Negative for abdominal pain, blood in stool, diarrhea and vomiting.  Genitourinary:  Negative for dysuria and flank pain.  Musculoskeletal:  Negative for back pain and neck pain.  Skin:  Negative for rash and wound.  Neurological:  Negative for weakness, numbness and headaches.  Hematological:  Does not bruise/bleed easily.  Psychiatric/Behavioral:  Negative for confusion.    Physical Exam Updated Vital Signs BP  115/78 (BP Location: Right Arm)   Pulse 69   Temp 98.1 F (36.7 C) (Oral)   Resp 16   Ht 1.778 m (5\' 10" )   Wt 90 kg   SpO2 100%   BMI 28.47 kg/m   Physical Exam Vitals and nursing note reviewed.  Constitutional:      Appearance: Normal appearance. He is well-developed.  HENT:     Head: Atraumatic.     Nose: Nose normal.     Mouth/Throat:     Mouth: Mucous membranes are moist.     Pharynx: Oropharynx is clear.  Eyes:     General: No scleral icterus.    Conjunctiva/sclera: Conjunctivae normal.     Pupils: Pupils are equal, round, and reactive to light.  Neck:     Vascular: No carotid bruit.     Trachea: No tracheal deviation.  Cardiovascular:     Rate and Rhythm: Normal rate and regular rhythm.     Pulses: Normal pulses.     Heart sounds: Normal heart sounds. No murmur heard.   No friction rub. No gallop.  Pulmonary:     Effort: Pulmonary effort is normal. No accessory muscle usage or respiratory distress.     Breath sounds: Normal breath sounds.  Chest:     Chest wall: No tenderness.  Abdominal:     General: Bowel sounds are normal. There is no distension.     Palpations: Abdomen is soft.     Tenderness: There is no abdominal tenderness. There is no guarding.     Comments: No abd contusion, pain or tenderness.   Genitourinary:    Comments: No cva tenderness. Musculoskeletal:        General: No swelling or tenderness.     Cervical back: Normal range of motion and neck supple. No rigidity or tenderness.     Comments: CTLS spine, non tender, aligned, no step off. No focal bony tenderness on extremity exam.   Skin:    General: Skin is warm and dry.     Findings: No rash.  Neurological:     Mental Status: He is alert.     Comments: Alert, speech clear. GCS 15. Motor/sens grossly intact bil. Steady gait.   Psychiatric:        Mood and Affect: Mood normal.    ED Results / Procedures / Treatments   Labs (all labs ordered are listed, but only abnormal results are  displayed) Results for orders placed or performed during the hospital encounter of 03/14/21  CBC with Differential  Result Value Ref Range   WBC 4.3 4.0 - 10.5 K/uL   RBC 5.24 4.22 - 5.81 MIL/uL   Hemoglobin 16.0 13.0 - 17.0 g/dL   HCT 05/14/21 11.9 - 14.7 %   MCV 94.1 80.0 - 100.0 fL   MCH 30.5 26.0 - 34.0 pg   MCHC 32.5 30.0 - 36.0 g/dL   RDW  13.1 11.5 - 15.5 %   Platelets 241 150 - 400 K/uL   nRBC 0.0 0.0 - 0.2 %   Neutrophils Relative % 54 %   Neutro Abs 2.4 1.7 - 7.7 K/uL   Lymphocytes Relative 29 %   Lymphs Abs 1.2 0.7 - 4.0 K/uL   Monocytes Relative 14 %   Monocytes Absolute 0.6 0.1 - 1.0 K/uL   Eosinophils Relative 1 %   Eosinophils Absolute 0.1 0.0 - 0.5 K/uL   Basophils Relative 1 %   Basophils Absolute 0.0 0.0 - 0.1 K/uL   Immature Granulocytes 1 %   Abs Immature Granulocytes 0.02 0.00 - 0.07 K/uL  Comprehensive metabolic panel  Result Value Ref Range   Sodium 135 135 - 145 mmol/L   Potassium 3.6 3.5 - 5.1 mmol/L   Chloride 102 98 - 111 mmol/L   CO2 23 22 - 32 mmol/L   Glucose, Bld 113 (H) 70 - 99 mg/dL   BUN 14 6 - 20 mg/dL   Creatinine, Ser 0.99 0.61 - 1.24 mg/dL   Calcium 9.2 8.9 - 83.3 mg/dL   Total Protein 7.2 6.5 - 8.1 g/dL   Albumin 4.0 3.5 - 5.0 g/dL   AST 24 15 - 41 U/L   ALT 25 0 - 44 U/L   Alkaline Phosphatase 40 38 - 126 U/L   Total Bilirubin 1.9 (H) 0.3 - 1.2 mg/dL   GFR, Estimated >82 >50 mL/min   Anion gap 10 5 - 15  Ethanol  Result Value Ref Range   Alcohol, Ethyl (B) <10 <10 mg/dL  Salicylate level  Result Value Ref Range   Salicylate Lvl <7.0 (L) 7.0 - 30.0 mg/dL  Valproic acid level  Result Value Ref Range   Valproic Acid Lvl <10 (L) 50.0 - 100.0 ug/mL   CT HEAD WO CONTRAST ( )  Result Date: 03/14/2021 CLINICAL DATA:  Syncope EXAM: CT HEAD WITHOUT CONTRAST TECHNIQUE: Contiguous axial images were obtained from the base of the skull through the vertex without intravenous contrast. COMPARISON:  05/27/2020 FINDINGS: Brain: No acute  infarct or hemorrhage. Lateral ventricles and midline structures are unremarkable. No acute extra-axial fluid collections. No mass effect. Vascular: No hyperdense vessel or unexpected calcification. Skull: Normal. Negative for fracture or focal lesion. Sinuses/Orbits: No acute finding. Other: None. IMPRESSION: 1. Stable head CT, no acute intracranial process. Electronically Signed   By: Sharlet Salina M.D.   On: 03/14/2021 21:04   CT Cervical Spine Wo Contrast  Result Date: 03/14/2021 CLINICAL DATA:  Syncope EXAM: CT CERVICAL SPINE WITHOUT CONTRAST TECHNIQUE: Multidetector CT imaging of the cervical spine was performed without intravenous contrast. Multiplanar CT image reconstructions were also generated. COMPARISON:  05/27/2020 FINDINGS: Alignment: Stable reversal of cervical lordosis. Otherwise alignment is anatomic. Skull base and vertebrae: No acute fracture. No primary bone lesion or focal pathologic process. Soft tissues and spinal canal: No prevertebral fluid or swelling. No visible canal hematoma. Disc levels:  No significant spondylosis or facet hypertrophy. Upper chest: Central airway is patent.  Lung apices are clear. Other: Reconstructed images demonstrate no additional findings. IMPRESSION: 1. No acute cervical spine fracture. Electronically Signed   By: Sharlet Salina M.D.   On: 03/14/2021 21:07    EKG EKG Interpretation  Date/Time:  Saturday March 14 2021 19:51:03 EDT Ventricular Rate:  76 PR Interval:  172 QRS Duration: 88 QT Interval:  370 QTC Calculation: 416 R Axis:   89 Text Interpretation: Normal sinus rhythm with sinus arrhythmia Nonspecific ST and T wave  abnormality Confirmed by Cathren LaineSteinl, Rondale Nies ([id number]) on 03/15/2021 7:32:46 AM  Radiology CT HEAD WO CONTRAST (5MM)  Result Date: 03/14/2021 CLINICAL DATA:  Syncope EXAM: CT HEAD WITHOUT CONTRAST TECHNIQUE: Contiguous axial images were obtained from the base of the skull through the vertex without intravenous contrast.  COMPARISON:  05/27/2020 FINDINGS: Brain: No acute infarct or hemorrhage. Lateral ventricles and midline structures are unremarkable. No acute extra-axial fluid collections. No mass effect. Vascular: No hyperdense vessel or unexpected calcification. Skull: Normal. Negative for fracture or focal lesion. Sinuses/Orbits: No acute finding. Other: None. IMPRESSION: 1. Stable head CT, no acute intracranial process. Electronically Signed   By: Sharlet SalinaMichael  Brown M.D.   On: 03/14/2021 21:04   CT Cervical Spine Wo Contrast  Result Date: 03/14/2021 CLINICAL DATA:  Syncope EXAM: CT CERVICAL SPINE WITHOUT CONTRAST TECHNIQUE: Multidetector CT imaging of the cervical spine was performed without intravenous contrast. Multiplanar CT image reconstructions were also generated. COMPARISON:  05/27/2020 FINDINGS: Alignment: Stable reversal of cervical lordosis. Otherwise alignment is anatomic. Skull base and vertebrae: No acute fracture. No primary bone lesion or focal pathologic process. Soft tissues and spinal canal: No prevertebral fluid or swelling. No visible canal hematoma. Disc levels:  No significant spondylosis or facet hypertrophy. Upper chest: Central airway is patent.  Lung apices are clear. Other: Reconstructed images demonstrate no additional findings. IMPRESSION: 1. No acute cervical spine fracture. Electronically Signed   By: Sharlet SalinaMichael  Brown M.D.   On: 03/14/2021 21:07    Procedures Procedures   Medications Ordered in ED Medications - No data to display  ED Course  I have reviewed the triage vital signs and the nursing notes.  Pertinent labs & imaging results that were available during my care of the patient were reviewed by me and considered in my medical decision making (see chart for details).    MDM Rules/Calculators/A&P                          Iv ns. Continuous pulse ox and cardiac monitoring. Stat labs, ecg. Imaging ordered.   Reviewed nursing notes and prior charts for additional history.    Labs reviewed/interpreted by me - wbc normal, hgb normal. Chem normal.   CT reviewed/interpreted by me - no hem.   Po fluids, food. Ambulated in hall.  No faintness or dizziness. No pain or other c/o.   Pt currently appears stable for d/c.  Rec pcp f/u.  Return precautions provided.    Final Clinical Impression(s) / ED Diagnoses Final diagnoses:  None    Rx / DC Orders ED Discharge Orders     None          Cathren LaineSteinl, Francie Keeling, MD 03/15/21 [phone]

## 2021-04-17 ENCOUNTER — Emergency Department
Admission: EM | Admit: 2021-04-17 | Discharge: 2021-04-17 | Disposition: A | Discharge status: Home / Self Care | Payer: Medicaid Other | Attending: Emergency Medicine | Admitting: Emergency Medicine

## 2021-04-17 ENCOUNTER — Other Ambulatory Visit: Payer: Self-pay

## 2021-04-17 DIAGNOSIS — R531 Weakness: Secondary | ICD-10-CM | POA: Insufficient documentation

## 2021-04-17 DIAGNOSIS — Z139 Encounter for screening, unspecified: Secondary | ICD-10-CM

## 2021-04-17 DIAGNOSIS — R5383 Other fatigue: Secondary | ICD-10-CM | POA: Insufficient documentation

## 2021-04-17 DIAGNOSIS — R001 Bradycardia, unspecified: Secondary | ICD-10-CM | POA: Insufficient documentation

## 2021-04-17 DIAGNOSIS — F17210 Nicotine dependence, cigarettes, uncomplicated: Secondary | ICD-10-CM | POA: Insufficient documentation

## 2021-04-17 LAB — CBC
HCT: 44.3 % (ref 39.0–52.0)
Hemoglobin: 15 g/dL (ref 13.0–17.0)
MCH: 31.2 pg (ref 26.0–34.0)
MCHC: 33.9 g/dL (ref 30.0–36.0)
MCV: 92.1 fL (ref 80.0–100.0)
Platelets: 223 10*3/uL (ref 150–400)
RBC: 4.81 MIL/uL (ref 4.22–5.81)
RDW: 12.8 % (ref 11.5–15.5)
WBC: 4.3 10*3/uL (ref 4.0–10.5)
nRBC: 0 % (ref 0.0–0.2)

## 2021-04-17 LAB — COMPREHENSIVE METABOLIC PANEL
ALT: 21 U/L (ref 0–44)
AST: 26 U/L (ref 15–41)
Albumin: 3.8 g/dL (ref 3.5–5.0)
Alkaline Phosphatase: 42 U/L (ref 38–126)
Anion gap: 8 (ref 5–15)
BUN: 13 mg/dL (ref 6–20)
CO2: 30 mmol/L (ref 22–32)
Calcium: 9.2 mg/dL (ref 8.9–10.3)
Chloride: 101 mmol/L (ref 98–111)
Creatinine, Ser: 1.06 mg/dL (ref 0.61–1.24)
GFR, Estimated: >60 mL/min (ref >60)
Glucose, Bld: 98 mg/dL (ref 70–99)
Potassium: 3.9 mmol/L (ref 3.5–5.1)
Sodium: 139 mmol/L (ref 135–145)
Total Bilirubin: 1.1 mg/dL (ref 0.3–1.2)
Total Protein: 7.3 g/dL (ref 6.5–8.1)

## 2021-04-17 LAB — TROPONIN I (HIGH SENSITIVITY): Troponin I (High Sensitivity): 3 ng/L (ref <18)

## 2021-04-17 LAB — TSH: TSH: 0.531 u[IU]/mL (ref 0.350–4.500)

## 2021-04-17 NOTE — ED Triage Notes (Signed)
Pt arrives via [facility]. EMS states pt was found by bystander on the ground. Pt was sleeping. Per EMS, pt hasnt eaten for a few days and CBG was 70. Ems states pt ate biscuit and new CBG is 93.

## 2021-04-17 NOTE — ED Provider Notes (Signed)
[facility] Emergency Department Provider Note   ____________________________________________    I have reviewed the triage vital signs and the nursing notes.   HISTORY  Chief Complaint Weakness     HPI Timothy [name] is a 25 y.o. male with history of schizophrenia who presents with reported complaints of weakness.  EMS notes that the patient was found sitting on the side of the road.  He denies chest pain abdominal pain, no nausea or vomiting.  History of schizophrenia, denies auditory hallucinations but thinks that his schizophrenia may be worsening.  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  Prior to Admission medications   Medication Sig Start Date End Date Taking? Authorizing Provider  divalproex (DEPAKOTE) 500 MG DR tablet Take 1 tablet (500 mg total) by mouth every 12 (twelve) hours. 11/06/20   Clapacs, Jackquline Denmark, MD  FLUoxetine (PROZAC) 40 MG capsule Take 1 capsule (40 mg total) by mouth daily. 11/06/20   Clapacs, Jackquline Denmark, MD  prazosin (MINIPRESS) 1 MG capsule Take 1 capsule (1 mg total) by mouth at bedtime. 11/06/20   Clapacs, Jackquline Denmark, MD  QUEtiapine (SEROQUEL) 400 MG tablet Take 1 tablet (400 mg total) by mouth at bedtime. 11/06/20   Clapacs, Jackquline Denmark, MD  QUEtiapine (SEROQUEL) 50 MG tablet Take 1 tablet (50 mg total) by mouth 2 (two) times  daily. 11/06/20   Clapacs, Jackquline Denmark, MD  traZODone (DESYREL) 50 MG tablet TAKE 1 TABLET BY MOUTH AT BEDTIME AS NEEDED FOR SLEEP. 08/25/20 08/25/21  Jesse Sans, MD  traZODone (DESYREL) 50 MG tablet Take 1 tablet (50 mg total) by mouth at bedtime as needed for sleep. 10/08/20   Jesse Sans, MD     Allergies Haldol [haloperidol lactate], Penicillins, and Zyprexa [olanzapine]  Family History  Problem Relation Age of Onset   Mental illness Brother    Drug abuse Brother    Mental illness Cousin    Suicidality Cousin    Diabetes Maternal Grandmother     Social History Social History   Tobacco Use   Smoking status: Some Days    Packs/day: 0.25    Types: Cigarettes   Smokeless tobacco: Never  Vaping Use   Vaping Use: Never used  Substance Use Topics   Alcohol use: Yes    Alcohol/week: 2.0 standard drinks    Types: 2 Cans of beer per week   Drug use: Yes    Types: Marijuana, MDMA (Ecstacy)    Review of Systems  Constitutional: No dizziness Eyes: No visual changes.  ENT: No sore throat. Cardiovascular: Denies chest pain. Respiratory: Denies shortness of breath. Gastrointestinal: No abdominal pain.  No nausea, no vomiting.   Genitourinary: No painful urination Musculoskeletal: Negative for injury Skin: Negative for rash. Neurological: Negative for headaches, no neuro weakness   ____________________________________________   PHYSICAL EXAM:  VITAL SIGNS: ED Triage Vitals  Enc Vitals Group  BP 04/17/21 0812 133/65     Pulse Rate 04/17/21 0812 (!) 48     Resp 04/17/21 0812 15     Temp 04/17/21 0812 97.9 F (36.6 C)     Temp Source 04/17/21 0812 Oral     SpO2 04/17/21 0812 100 %     Weight --      Height 04/17/21 0813 1.918 m (6' 3.5")     Head Circumference --      Peak Flow --      Pain Score 04/17/21 0813 0     Pain Loc --      Pain Edu? --      Excl. in GC? --     Constitutional: Alert, appears appropriately oriented, no acute distress Eyes:  Conjunctivae are normal.  Head: Atraumatic. Nose: No congestion/rhinnorhea. Mouth/Throat: Mucous membranes are moist.    Cardiovascular: Mildly bradycardic regular rhythm. Grossly normal heart sounds.  Good peripheral circulation. Respiratory: Normal respiratory effort.  No retractions. Lungs CTAB. Gastrointestinal: Soft and nontender. No distention.    Musculoskeletal: No lower extremity tenderness nor edema.  Warm and well perfused Neurologic:  Normal speech and language. No gross focal neurologic deficits are appreciated.  Skin:  Skin is warm, dry and intact. No rash noted. Psychiatric: Flat affect, appropriate mood, quiet speech behavior appropriate   ____________________________________________   LABS (all labs ordered are listed, but only abnormal results are displayed)  Labs Reviewed  CBC  COMPREHENSIVE METABOLIC PANEL  TSH  URINE DRUG SCREEN, QUALITATIVE ([facility] ONLY)  TROPONIN I (HIGH SENSITIVITY)   ____________________________________________  EKG  ED ECG REPORT I, Jene Every, the attending physician, personally viewed and interpreted this ECG.  Date: 04/17/2021  Rhythm: Sinus bradycardia QRS Axis: normal Intervals: normal ST/T Wave abnormalities: normal Narrative Interpretation: no evidence of acute ischemia  ____________________________________________  RADIOLOGY   ____________________________________________   PROCEDURES  Procedure(s) performed: No  Procedures   Critical Care performed: No ____________________________________________   INITIAL IMPRESSION / ASSESSMENT AND PLAN / ED COURSE  Pertinent labs & imaging results that were available during my care of the patient were reviewed by me and considered in my medical decision making (see chart for details).   Patient presents after being found on the side of the road, he is unwilling to state where he was walking.  Review of records demonstrates the patient does have a history of  homelessness as well as schizophrenia.  He does not have any physical complaints at this time.  Vital signs are overall reassuring, mild bradycardia which was noted 1 month ago during last ED stay.  No dizziness.  Will check labs to rule out infection, electrolyte abnormalities.    Lab work is quite reassuring, normal troponin, normal TSH given his bradycardia.  Discussed with him more, he reports he is homeless which is why he was found on the side of the road, he reports he was sleeping.  He does appear to be at his baseline  We will provide patient with a meal, no indication for admission or psychiatric consultation at this time    ____________________________________________   FINAL CLINICAL IMPRESSION(S) / ED DIAGNOSES  Final diagnoses:  Fatigue, unspecified type  Encounter for medical screening examination        Note:  This document was prepared using Dragon voice recognition software and may include unintentional dictation errors.    Jene Every, MD 04/17/21 [phone]

## 2021-04-17 NOTE — ED Notes (Addendum)
Security at bedside searching belongings for knife that pt stated he had in his bag. No knife found by security. Drugs found and taken by security

## 2021-04-20 ENCOUNTER — Emergency Department
Admission: EM | Admit: 2021-04-20 | Discharge: 2021-04-21 | Disposition: A | Discharge status: Home / Self Care | Payer: Self-pay | Attending: Emergency Medicine | Admitting: Emergency Medicine

## 2021-04-20 ENCOUNTER — Other Ambulatory Visit: Payer: Self-pay

## 2021-04-20 DIAGNOSIS — F32A Depression, unspecified: Secondary | ICD-10-CM | POA: Insufficient documentation

## 2021-04-20 DIAGNOSIS — F250 Schizoaffective disorder, bipolar type: Secondary | ICD-10-CM

## 2021-04-20 DIAGNOSIS — F17210 Nicotine dependence, cigarettes, uncomplicated: Secondary | ICD-10-CM | POA: Insufficient documentation

## 2021-04-20 DIAGNOSIS — Z20822 Contact with and (suspected) exposure to covid-19: Secondary | ICD-10-CM | POA: Insufficient documentation

## 2021-04-20 DIAGNOSIS — F251 Schizoaffective disorder, depressive type: Secondary | ICD-10-CM | POA: Insufficient documentation

## 2021-04-20 DIAGNOSIS — F17200 Nicotine dependence, unspecified, uncomplicated: Secondary | ICD-10-CM | POA: Diagnosis present

## 2021-04-20 DIAGNOSIS — Z5900 Homelessness unspecified: Secondary | ICD-10-CM | POA: Insufficient documentation

## 2021-04-20 DIAGNOSIS — F1220 Cannabis dependence, uncomplicated: Secondary | ICD-10-CM | POA: Insufficient documentation

## 2021-04-20 DIAGNOSIS — Z046 Encounter for general psychiatric examination, requested by authority: Secondary | ICD-10-CM | POA: Insufficient documentation

## 2021-04-20 DIAGNOSIS — Z79899 Other long term (current) drug therapy: Secondary | ICD-10-CM | POA: Insufficient documentation

## 2021-04-20 DIAGNOSIS — F419 Anxiety disorder, unspecified: Secondary | ICD-10-CM | POA: Insufficient documentation

## 2021-04-20 LAB — COMPREHENSIVE METABOLIC PANEL
ALT: 19 U/L (ref 0–44)
AST: 25 U/L (ref 15–41)
Albumin: 4.2 g/dL (ref 3.5–5.0)
Alkaline Phosphatase: 41 U/L (ref 38–126)
Anion gap: 9 (ref 5–15)
BUN: 13 mg/dL (ref 6–20)
CO2: 27 mmol/L (ref 22–32)
Calcium: 9.3 mg/dL (ref 8.9–10.3)
Chloride: 101 mmol/L (ref 98–111)
Creatinine, Ser: 1.15 mg/dL (ref 0.61–1.24)
GFR, Estimated: >60 mL/min (ref >60)
Glucose, Bld: 79 mg/dL (ref 70–99)
Potassium: 4.3 mmol/L (ref 3.5–5.1)
Sodium: 137 mmol/L (ref 135–145)
Total Bilirubin: 1.6 mg/dL — ABNORMAL HIGH (ref 0.3–1.2)
Total Protein: 7.7 g/dL (ref 6.5–8.1)

## 2021-04-20 LAB — CBC
HCT: 45.2 % (ref 39.0–52.0)
Hemoglobin: 15.5 g/dL (ref 13.0–17.0)
MCH: 31.2 pg (ref 26.0–34.0)
MCHC: 34.3 g/dL (ref 30.0–36.0)
MCV: 90.9 fL (ref 80.0–100.0)
Platelets: 230 10*3/uL (ref 150–400)
RBC: 4.97 MIL/uL (ref 4.22–5.81)
RDW: 12.6 % (ref 11.5–15.5)
WBC: 4 10*3/uL (ref 4.0–10.5)
nRBC: 0 % (ref 0.0–0.2)

## 2021-04-20 LAB — URINE DRUG SCREEN, QUALITATIVE (ARMC ONLY)
Amphetamines, Ur Screen: NOT DETECTED
Barbiturates, Ur Screen: NOT DETECTED
Benzodiazepine, Ur Scrn: NOT DETECTED
Cannabinoid 50 Ng, Ur ~~LOC~~: POSITIVE — AB
Cocaine Metabolite,Ur ~~LOC~~: NOT DETECTED
MDMA (Ecstasy)Ur Screen: NOT DETECTED
Methadone Scn, Ur: NOT DETECTED
Opiate, Ur Screen: NOT DETECTED
Phencyclidine (PCP) Ur S: NOT DETECTED
Tricyclic, Ur Screen: NOT DETECTED

## 2021-04-20 LAB — RESP PANEL BY RT-PCR (FLU A&B, COVID) ARPGX2
Influenza A by PCR: NEGATIVE
Influenza B by PCR: NEGATIVE
SARS Coronavirus 2 by RT PCR: NEGATIVE

## 2021-04-20 LAB — ETHANOL: Alcohol, Ethyl (B): <10 mg/dL (ref <10)

## 2021-04-20 NOTE — ED Notes (Signed)
Food tray was given with drink. 

## 2021-04-20 NOTE — ED Provider Notes (Signed)
Emergency Medicine Provider Triage Evaluation Note  Timothy [name] , a 25 y.o. male  was evaluated in triage.  Pt complains of depression and anxiety and wants to talk to psychiatrist.  Denies suicidal or homicidal ideation.  Patient thinks he has an appointment at the emergency department.  Review of Systems  Positive: Patient has depression and anxiety. Negative: No chest pain, chest tightness or abdominal pain.  Physical Exam  BP 109/70 (BP Location: Left Arm)   Pulse 73   Temp 98.9 F (37.2 C) (Oral)   Resp 16   Ht 6\' 4"  (1.93 m)   Wt 108.9 kg   SpO2 100%   BMI 29.21 kg/m  Gen:   Awake, no distress  Resp:  Normal effort MSK:   Moves extremities without difficulty  Other:    Medical Decision Making  Medically screening exam initiated at 4:47 PM.  Appropriate orders placed.  was informed that the remainder of the evaluation will be completed by another provider, this initial triage assessment does not replace that evaluation, and the importance of remaining in the ED until their evaluation is complete.     [name] [place], PA-C 04/20/21 1648    04/22/21, MD 04/20/21 [phone]

## 2021-04-20 NOTE — ED Triage Notes (Signed)
Pt is homeless and states that he received a message on his phone from  stating he had an appointment for today, pt is unable to state with who or for what, pt states that his phone is currently not charged to retrieve the message, pt asked what he thinks the appointment may be for and he states mental health. Pt states that he isn't taking his medication because he doesn't have money

## 2021-04-20 NOTE — ED Provider Notes (Signed)
[facility] Emergency Department Provider Note  Time seen: 5:18 PM  I have reviewed the triage vital signs and the nursing notes.   HISTORY  Chief Complaint Mental Health Problem   HPI Timothy [name] is a 25 y.o. male with a past medical history of schizophrenia, presents to the emergency department for psychiatric evaluation.  According to the patient states he has been having worsening anxiety and depression and is wishing to speak to a psychiatrist.  States he has not been taking his medication as prescribed since he does not have any money to fill his medications.  Denies any recent substance use.  Denies any SI or HI.   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  Prior to Admission medications   Medication Sig Start Date End Date Taking? Authorizing Provider  divalproex (DEPAKOTE) 500 MG DR tablet Take 1 tablet (500 mg total) by mouth every 12 (twelve) hours. 11/06/20   Clapacs, Jackquline Denmark, MD  FLUoxetine (PROZAC) 40 MG capsule Take 1 capsule (40 mg total) by mouth daily. 11/06/20   Clapacs, Jackquline Denmark, MD  prazosin (MINIPRESS) 1 MG capsule Take 1 capsule (1 mg total) by mouth at bedtime. 11/06/20   Clapacs, Jackquline Denmark, MD  QUEtiapine (SEROQUEL) 400 MG tablet Take 1 tablet (400 mg total) by mouth at bedtime. 11/06/20   Clapacs, Jackquline Denmark, MD  QUEtiapine (SEROQUEL) 50 MG tablet Take 1 tablet (50 mg total) by mouth 2 (two) times daily. 11/06/20    Clapacs, Jackquline Denmark, MD  traZODone (DESYREL) 50 MG tablet TAKE 1 TABLET BY MOUTH AT BEDTIME AS NEEDED FOR SLEEP. 08/25/20 08/25/21  Jesse Sans, MD  traZODone (DESYREL) 50 MG tablet Take 1 tablet (50 mg total) by mouth at bedtime as needed for sleep. 10/08/20   Jesse Sans, MD    Allergies  Allergen Reactions   Haldol [Haloperidol Lactate] Other (See Comments)    Muscle stiffness   Penicillins Hives    Felt like throat was closing Has patient had a PCN reaction causing immediate rash, facial/tongue/throat swelling, SOB or lightheadedness with hypotension: Unknown Has patient had a PCN reaction causing severe rash involving mucus membranes or skin necrosis: unknown Has patient had a PCN reaction that required hospitalization Unknown Has patient had a PCN reaction occurring within the last 10 years: Unknown If all of the above answers are "NO", then may proceed with Cephalosporin use.    Zyprexa [Olanzapine]     eps    Family History  Problem Relation Age of Onset   Mental illness Brother    Drug abuse Brother    Mental illness Cousin    Suicidality Cousin    Diabetes Maternal Grandmother     Social History Social History   Tobacco Use   Smoking status: Some Days    Packs/day: 0.25    Types: Cigarettes   Smokeless tobacco: Never  Vaping Use   Vaping Use: Never used  Substance Use Topics   Alcohol use: Yes  Alcohol/week: 2.0 standard drinks    Types: 2 Cans of beer per week   Drug use: Yes    Types: Marijuana, MDMA (Ecstacy)    Review of Systems Constitutional: Negative for fever. Cardiovascular: Negative for chest pain. Respiratory: Negative for shortness of breath. Gastrointestinal: Negative for abdominal pain, vomiting and diarrhea. Genitourinary: Negative for urinary compaints Musculoskeletal: Negative for musculoskeletal complaints Skin: Negative for skin complaints  Neurological: Negative for headache All other ROS  negative  ____________________________________________   PHYSICAL EXAM:  VITAL SIGNS: ED Triage Vitals  Enc Vitals Group     BP 04/20/21 1641 109/70     Pulse Rate 04/20/21 1641 73     Resp 04/20/21 1641 16     Temp 04/20/21 1641 98.9 F (37.2 C)     Temp Source 04/20/21 1641 Oral     SpO2 04/20/21 1641 100 %     Weight 04/20/21 1642 240 lb (108.9 kg)     Height 04/20/21 1642 6\' 4"  (1.93 m)     Head Circumference --      Peak Flow --      Pain Score 04/20/21 1642 0     Pain Loc --      Pain Edu? --      Excl. in GC? --    Constitutional: Alert and oriented. Well appearing and in no distress. Eyes: Normal exam ENT      Head: Normocephalic and atraumatic.      Mouth/Throat: Mucous membranes are moist. Cardiovascular: Normal rate, regular rhythm.  Respiratory: Normal respiratory effort without tachypnea nor retractions. Breath sounds are clear  Gastrointestinal: Soft and nontender. No distention. Musculoskeletal: Nontender with normal range of motion in all extremities Neurologic:  Normal speech and language. No gross focal neurologic deficits  Skin:  Skin is warm, dry and intact.  Psychiatric: Patient with a flat affect, admits worsening depression and anxiety  ____________________________________________    INITIAL IMPRESSION / ASSESSMENT AND PLAN / ED COURSE  Pertinent labs & imaging results that were available during my care of the patient were reviewed by me and considered in my medical decision making (see chart for details).   Patient presents emergency department for worsening depression and anxiety.  No SI or HI, does not meet IVC criteria but does wish to speak to a psychiatrist.  States he has been off of his medication due to finances.  Patient's labs are largely within normal limits, urine is pending.  Patients work-up is overall reassuring/nonrevealing.  Psychiatry is seen the patient and believe the patient safe for discharge home with outpatient  follow-up.  Patient will be discharged shortly.  04/22/21 was evaluated in Emergency Department on 04/20/2021 for the symptoms described in the history of present illness. He was evaluated in the context of the global COVID-19 pandemic, which necessitated consideration that the patient might be at risk for infection with the SARS-CoV-2 virus that causes COVID-19. Institutional protocols and algorithms that pertain to the evaluation of patients at risk for COVID-19 are in a state of rapid change based on information released by regulatory bodies including the CDC and federal and state organizations. These policies and algorithms were followed during the patient's care in the ED.  ____________________________________________   FINAL CLINICAL IMPRESSION(S) / ED DIAGNOSES  Depression Anxiety   04/22/2021, MD 04/20/21 2154

## 2021-04-20 NOTE — ED Notes (Signed)
Patient provided snack at appropriate snack time.  Pt consumed 100% of snack provided, tolerated well w/o complaints   Trash disposted of appropriately by patient.  

## 2021-04-20 NOTE — Consult Note (Signed)
[facility] Face-to-Face Psychiatry Consult   Reason for Consult: Mental Health Problem Referring Physician: Dr. Lenard Lance Patient Identification: Timothy [name] MRN:  [id number] Principal Diagnosis: <principal problem not specified> Diagnosis:  Active Problems:   Cannabis use disorder, moderate, dependence (HCC)   Schizoaffective disorder, bipolar type (HCC)   Tobacco use disorder   Schizoaffective disorder, depressive type (HCC)   Homelessness   Total Time spent with patient: 1 hour  Subjective: "I have no where to go. I am homeless." Timothy [name] is a 25 y.o. male patient presented to [facility] ED voluntarily because the patient believed he'd missed an appointment from [place] and stated he was homeless. The patient explained that he has no family members as his support system, which he identifies as his primary stressor due to a lack of secure housing. The patient shared that he is noncompliant with his psychiatric medications.  The patient was seen face-to-face by this provider; the chart was reviewed and consulted with Dr. Fanny Bien on 04/20/2021 due to the patient's care. It was discussed with the EDP that the patient does not meet the criteria to be admitted to the psychiatric inpatient unit. The patient will be discharged to follow up with [facility] in the morning for medication management. On evaluation, the patient is alert and oriented x4, calm, cooperative, and mood-congruent with affect. The patient denied symptoms of depression. The patient was attentive and cooperative throughout the assessment. The patient lacked insight and judgment.  The patient does not appear to be responding to internal or external stimuli. Neither is the patient presenting with any delusional thinking. The patient denies auditory or visual hallucinations. The patient denies any suicidal, homicidal, or self-harm ideations. The patient is not presenting with any psychotic or paranoid behaviors. During an encounter with the  patient, he could answer questions appropriately. HPI: Per Dr. Lenard Lance, Timothy [name] is a 25 y.o. male with a past medical history of schizophrenia, presents to the emergency department for psychiatric evaluation.  According to the patient states he has been having worsening anxiety and depression and is wishing to speak to a psychiatrist.  States he has not been taking his medication as prescribed since he does not have any money to fill his medications.  Denies any recent substance use.  Denies any SI or HI.   Past Psychiatric History:  Cannabis abuse    Paranoid schizophrenia (HCC)   Schizoaffective disorder, depressive type (HCC)   Risk to Self:   Risk to Others:   Prior Inpatient Therapy:   Prior Outpatient Therapy:    Past Medical History:  Past Medical History:  Diagnosis Date   Cannabis abuse    Paranoid schizophrenia (HCC)    Schizoaffective disorder, depressive type (HCC) 09/17/2014   No past surgical history on file. Family History:  Family History  Problem Relation Age of Onset   Mental illness Brother    Drug abuse Brother    Mental illness Cousin    Suicidality Cousin    Diabetes Maternal Grandmother    Family Psychiatric  History:  Social History:  Social History   Substance and Sexual Activity  Alcohol Use Yes   Alcohol/week: 2.0 standard drinks   Types: 2 Cans of beer per week     Social History   Substance and Sexual Activity  Drug Use Yes   Types: Marijuana, MDMA Chiropodist)    Social History   Socioeconomic History   Marital status: Single    Spouse name: Not on file  Number of children: Not on file   Years of education: Not on file   Highest education level: Not on file  Occupational History   Not on file  Tobacco Use   Smoking status: Some Days    Packs/day: 0.25    Types: Cigarettes   Smokeless tobacco: Never  Vaping Use   Vaping Use: Never used  Substance and Sexual Activity   Alcohol use: Yes    Alcohol/week: 2.0 standard  drinks    Types: 2 Cans of beer per week   Drug use: Yes    Types: Marijuana, MDMA (Ecstacy)   Sexual activity: Yes  Other Topics Concern   Not on file  Social History Narrative   Not on file   Social Determinants of Health   Financial Resource Strain: Not on file  Food Insecurity: Not on file  Transportation Needs: Not on file  Physical Activity: Not on file  Stress: Not on file  Social Connections: Not on file   Additional Social History:    Allergies:   Allergies  Allergen Reactions   Haldol [Haloperidol Lactate] Other (See Comments)    Muscle stiffness   Penicillins Hives    Felt like throat was closing Has patient had a PCN reaction causing immediate rash, facial/tongue/throat swelling, SOB or lightheadedness with hypotension: Unknown Has patient had a PCN reaction causing severe rash involving mucus membranes or skin necrosis: unknown Has patient had a PCN reaction that required hospitalization Unknown Has patient had a PCN reaction occurring within the last 10 years: Unknown If all of the above answers are "NO", then may proceed with Cephalosporin use.    Zyprexa [Olanzapine]     eps    Labs:  Results for orders placed or performed during the hospital encounter of 04/20/21 (from the past 48 hour(s))  Comprehensive metabolic panel     Status: Abnormal   Collection Time: 04/20/21  4:47 PM  Result Value Ref Range   Sodium 137 135 - 145 mmol/L   Potassium 4.3 3.5 - 5.1 mmol/L   Chloride 101 98 - 111 mmol/L   CO2 27 22 - 32 mmol/L   Glucose, Bld 79 70 - 99 mg/dL    Comment: Glucose reference range applies only to samples taken after fasting for at least 8 hours.   BUN 13 6 - 20 mg/dL   Creatinine, Ser 1.01 0.61 - 1.24 mg/dL   Calcium 9.3 8.9 - 75.1 mg/dL   Total Protein 7.7 6.5 - 8.1 g/dL   Albumin 4.2 3.5 - 5.0 g/dL   AST 25 15 - 41 U/L   ALT 19 0 - 44 U/L   Alkaline Phosphatase 41 38 - 126 U/L   Total Bilirubin 1.6 (H) 0.3 - 1.2 mg/dL   GFR, Estimated  >02 >58 mL/min    Comment: (NOTE) Calculated using the CKD-EPI Creatinine Equation (2021)    Anion gap 9 5 - 15    Comment: Performed at [facility], [address] [place], [place] [address]  Ethanol     Status: None   Collection Time: 04/20/21  4:47 PM  Result Value Ref Range   Alcohol, Ethyl (B) <10 <10 mg/dL    Comment: (NOTE) Lowest detectable limit for serum alcohol is 10 mg/dL.  For medical purposes only. Performed at [facility], [address] [place], [place] [address]   cbc     Status: None   Collection Time: 04/20/21  4:47 PM  Result Value Ref Range  WBC 4.0 4.0 - 10.5 K/uL   RBC 4.97 4.22 - 5.81 MIL/uL   Hemoglobin 15.5 13.0 - 17.0 g/dL   HCT 78.5 88.5 - 02.7 %   MCV 90.9 80.0 - 100.0 fL   MCH 31.2 26.0 - 34.0 pg   MCHC 34.3 30.0 - 36.0 g/dL   RDW 74.1 28.7 - 86.7 %   Platelets 230 150 - 400 K/uL   nRBC 0.0 0.0 - 0.2 %    Comment: Performed at [facility], [address] [place], [place] [address]  Urine Drug Screen, Qualitative     Status: Abnormal   Collection Time: 04/20/21  6:29 PM  Result Value Ref Range   Tricyclic, Ur Screen NONE DETECTED NONE DETECTED   Amphetamines, Ur Screen NONE DETECTED NONE DETECTED   MDMA (Ecstasy)Ur Screen NONE DETECTED NONE DETECTED   Cocaine Metabolite,Ur [place] NONE DETECTED NONE DETECTED   Opiate, Ur Screen NONE DETECTED NONE DETECTED   Phencyclidine (PCP) Ur S NONE DETECTED NONE DETECTED   Cannabinoid 50 Ng, Ur [place] POSITIVE (A) NONE DETECTED   Barbiturates, Ur Screen NONE DETECTED NONE DETECTED   Benzodiazepine, Ur Scrn NONE DETECTED NONE DETECTED   Methadone Scn, Ur NONE DETECTED NONE DETECTED    Comment: (NOTE) Tricyclics + metabolites, urine    Cutoff 1000 ng/mL Amphetamines + metabolites, urine  Cutoff 1000 ng/mL MDMA (Ecstasy), urine              Cutoff 500 ng/mL Cocaine Metabolite, urine          Cutoff 300 ng/mL Opiate + metabolites, urine        Cutoff 300 ng/mL Phencyclidine  (PCP), urine         Cutoff 25 ng/mL Cannabinoid, urine                 Cutoff 50 ng/mL Barbiturates + metabolites, urine  Cutoff 200 ng/mL Benzodiazepine, urine              Cutoff 200 ng/mL Methadone, urine                   Cutoff 300 ng/mL  The urine drug screen provides only a preliminary, unconfirmed analytical test result and should not be used for non-medical purposes. Clinical consideration and professional judgment should be applied to any positive drug screen result due to possible interfering substances. A more specific alternate chemical method must be used in order to obtain a confirmed analytical result. Gas chromatography / mass spectrometry (GC/MS) is the preferred confirm atory method. Performed at [facility], [address] [place], [place] [address]   Resp Panel by RT-PCR (Flu A&B, Covid) Nasopharyngeal Swab     Status: None   Collection Time: 04/20/21  6:29 PM   Specimen: Nasopharyngeal Swab; Nasopharyngeal(NP) swabs in vial transport medium  Result Value Ref Range   SARS Coronavirus 2 by RT PCR NEGATIVE NEGATIVE    Comment: (NOTE) SARS-CoV-2 target nucleic acids are NOT DETECTED.  The SARS-CoV-2 RNA is generally detectable in upper respiratory specimens during the acute phase of infection. The lowest concentration of SARS-CoV-2 viral copies this assay can detect is 138 copies/mL. A negative result does not preclude SARS-Cov-2 infection and should not be used as the sole basis for treatment or other patient management decisions. A negative result may occur with  improper specimen collection/handling, submission of specimen other than nasopharyngeal swab, presence of viral mutation(s) within the areas targeted by this assay, and inadequate number of viral copies(<138  copies/mL). A negative result must be combined with clinical observations, patient history, and epidemiological information. The expected result is Negative.  Fact Sheet for  Patients:  BloggerCourse.com  Fact Sheet for Healthcare Providers:  SeriousBroker.it  This test is no t yet approved or cleared by the [place] FDA and  has been authorized for detection and/or diagnosis of SARS-CoV-2 by FDA under an Emergency Use Authorization (EUA). This EUA will remain  in effect (meaning this test can be used) for the duration of the COVID-19 declaration under Section 564(b)(1) of the Act, 21 U.S.C.section 360bbb-3(b)(1), unless the authorization is terminated  or revoked sooner.       Influenza A by PCR NEGATIVE NEGATIVE   Influenza B by PCR NEGATIVE NEGATIVE    Comment: (NOTE) The Xpert Xpress SARS-CoV-2/FLU/RSV plus assay is intended as an aid in the diagnosis of influenza from Nasopharyngeal swab specimens and should not be used as a sole basis for treatment. Nasal washings and aspirates are unacceptable for Xpert Xpress SARS-CoV-2/FLU/RSV testing.  Fact Sheet for Patients: BloggerCourse.com  Fact Sheet for Healthcare Providers: SeriousBroker.it  This test is not yet approved or cleared by the [place] FDA and has been authorized for detection and/or diagnosis of SARS-CoV-2 by FDA under an Emergency Use Authorization (EUA). This EUA will remain in effect (meaning this test can be used) for the duration of the COVID-19 declaration under Section 564(b)(1) of the Act, 21 U.S.C. section 360bbb-3(b)(1), unless the authorization is terminated or revoked.  Performed at [facility], [address] [place], [place] [address]     No current facility-administered medications for this encounter.   Current Outpatient Medications  Medication Sig Dispense Refill   divalproex (DEPAKOTE) 500 MG DR tablet Take 1 tablet (500 mg total) by mouth every 12 (twelve) hours. 60 tablet 1   FLUoxetine (PROZAC) 40 MG capsule Take 1 capsule (40 mg total)  by mouth daily. 30 capsule 1   prazosin (MINIPRESS) 1 MG capsule Take 1 capsule (1 mg total) by mouth at bedtime. 30 capsule 1   QUEtiapine (SEROQUEL) 400 MG tablet Take 1 tablet (400 mg total) by mouth at bedtime. 30 tablet 1   QUEtiapine (SEROQUEL) 50 MG tablet Take 1 tablet (50 mg total) by mouth 2 (two) times daily. 60 tablet 1   traZODone (DESYREL) 50 MG tablet TAKE 1 TABLET BY MOUTH AT BEDTIME AS NEEDED FOR SLEEP. 7 tablet 0   traZODone (DESYREL) 50 MG tablet Take 1 tablet (50 mg total) by mouth at bedtime as needed for sleep. 30 tablet 1    Musculoskeletal: Strength & Muscle Tone: within normal limits Gait & Station: normal Patient leans: N/A   Psychiatric Specialty Exam:  Presentation  General Appearance: Appropriate for Environment  Eye Contact:Fair  Speech:Normal Rate  Speech Volume:Normal  Handedness:Right   Mood and Affect  Mood:Euthymic  Affect:Appropriate   Thought Process  Thought Processes:Coherent  Descriptions of Associations:Intact  Orientation:Full (Time, Place and Person)  Thought Content:Logical  History of Schizophrenia/Schizoaffective disorder:Yes  Duration of Psychotic Symptoms:Greater than six months  Hallucinations:Hallucinations: None  Ideas of Reference:None  Suicidal Thoughts:Suicidal Thoughts: No  Homicidal Thoughts:Homicidal Thoughts: No   Sensorium  Memory:Immediate Good; Recent Good; Remote Good  Judgment:Fair  Insight:Fair   Executive Functions  Concentration:Good  Attention Span:Good  Recall:Fair  Fund of Knowledge:Fair  Language:Good   Psychomotor Activity  Psychomotor Activity:Psychomotor Activity: Normal   Assets  Assets:Communication Skills; Desire for Improvement; Financial Resources/Insurance; Resilience; Social Support   Sleep  Sleep:Sleep:  Fair   Physical Exam: Physical Exam Vitals and nursing note reviewed.  Constitutional:      Appearance: Normal appearance.  HENT:     Head:  Normocephalic.     Nose: Nose normal.     Mouth/Throat:     Mouth: Mucous membranes are moist.  Cardiovascular:     Rate and Rhythm: Normal rate and regular rhythm.     Pulses: Normal pulses.  Pulmonary:     Effort: Pulmonary effort is normal.  Musculoskeletal:        General: Normal range of motion.     Cervical back: Normal range of motion and neck supple.  Neurological:     General: No focal deficit present.     Mental Status: He is alert and oriented to person, place, and time. Mental status is at baseline.  Psychiatric:        Attention and Perception: Attention and perception normal.        Mood and Affect: Mood and affect normal.        Speech: Speech normal.        Behavior: Behavior normal. Behavior is cooperative.        Thought Content: Thought content normal.        Cognition and Memory: Cognition and memory normal.        Judgment: Judgment normal.   Review of Systems  Psychiatric/Behavioral:  Positive for substance abuse.   All other systems reviewed and are negative. Blood pressure 109/70, pulse 73, temperature 98.9 F (37.2 C), temperature source Oral, resp. rate 16, height  (1.93 m), weight 108.9 kg, SpO2 100 %. Body mass index is 29.21 kg/m.  Treatment Plan Summary: Plan The patient does not meet criteria for psychiatric inpatient admission and will be discharged to follow-up with [facility] in the morning.  Disposition: No evidence of imminent risk to self or others at present.   Patient does not meet criteria for psychiatric inpatient admission. Supportive therapy provided about ongoing stressors. Refer to IOP. Discussed crisis plan, support from social network, calling 911, coming to the Emergency Department, and calling Suicide Hotline.  Gillermo Murdoch, NP 04/20/2021 11:41 PM

## 2021-04-20 NOTE — ED Notes (Signed)
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
## 2021-04-20 NOTE — ED Notes (Signed)
Pt. To BHU from ED ambulatory without difficulty, to room  BHU 2. Report from [facility] Pt. Is alert and oriented, warm and dry in no distress. Pt. Denies SI, HI, and AVH. Pt. Calm and cooperative. Pt. Made aware of security cameras and Q15 minute rounds. Pt. Encouraged to let Nursing staff know of any concerns or needs.

## 2021-04-20 NOTE — ED Notes (Signed)
VOL, pend psych consult 

## 2021-04-20 NOTE — ED Provider Notes (Signed)
Psychiatry, Gillermo Murdoch, saw patient advised me that they recommend patient be discharged to follow-up with [facility].  They do recommend it would be reasonable to wait until the morning prior to discharging him given the time of day.   Sharyn Creamer, MD 04/20/21 2147

## 2021-04-21 NOTE — ED Notes (Signed)
Patient alert and oriented patient denies SI/HI/AVH. Discharge paperwork reviewed with patient. Patient verbalized understanding. Resources reviewed with patient. Patient refused vitals and signing signature pad.

## 2021-04-21 NOTE — BH Assessment (Signed)
Pt identified finances as a barrier to accessing his psych medications. This Clinical research associate provided pt's nurse with outpatient resources, to be given to pt upon discharge.

## 2021-05-19 NOTE — Congregational Nurse Program (Signed)
  Dept: [phone]   Congregational Nurse Program Note  Date of Encounter: 05/19/2021 Client new to clinic. Requested vital signs be checked. BP 130/78. Application for [facility] completed, RN to deliver. Client reports he is currently staying at the homeless shelter and working at "a Production designer, theatre/television/film behind [employer]    Past Medical History: Past Medical History:  Diagnosis Date   Cannabis abuse    Paranoid schizophrenia (HCC)    Schizoaffective disorder, depressive type (HCC) 09/17/2014    Encounter Details:  CNP Questionnaire - 05/19/21 1000       Questionnaire   Do you give verbal consent to treat you today? Yes    Location Patient Served  [facility]    Visit Setting Church or Organization    Patient Status Homeless   currently staying at the [employer] shelter   Insurance Uninsured ([employer]s/Self-Pay)    Insurance Referral N/A    Medication N/A   cleint reports eh is not taking any medications at this time   Medical Provider No   application for the [facility] completed   Screening Referrals N/A    Medical Referral Other    Medical Appointment Made N/A    Food Have Food Insecurities    Transportation N/A   uses the [employer] bus system   Housing/Utilities No permanent housing    Interpersonal Safety N/A    Intervention Blood pressure;Advocate    ED Visit Averted N/A    Life-Saving Intervention Made N/A

## 2021-06-03 ENCOUNTER — Ambulatory Visit: Payer: Self-pay

## 2021-06-14 ENCOUNTER — Other Ambulatory Visit: Payer: Self-pay

## 2021-06-14 ENCOUNTER — Emergency Department
Admission: EM | Admit: 2021-06-14 | Discharge: 2021-06-15 | Disposition: A | Discharge status: Other Acute Inpatient Hospital | Payer: 59 | Attending: Emergency Medicine | Admitting: Emergency Medicine

## 2021-06-14 ENCOUNTER — Encounter: Payer: Self-pay | Admitting: Emergency Medicine

## 2021-06-14 DIAGNOSIS — Z20822 Contact with and (suspected) exposure to covid-19: Secondary | ICD-10-CM | POA: Insufficient documentation

## 2021-06-14 DIAGNOSIS — F99 Mental disorder, not otherwise specified: Secondary | ICD-10-CM | POA: Insufficient documentation

## 2021-06-14 DIAGNOSIS — R251 Tremor, unspecified: Secondary | ICD-10-CM | POA: Insufficient documentation

## 2021-06-14 DIAGNOSIS — F17210 Nicotine dependence, cigarettes, uncomplicated: Secondary | ICD-10-CM | POA: Insufficient documentation

## 2021-06-14 DIAGNOSIS — F251 Schizoaffective disorder, depressive type: Secondary | ICD-10-CM | POA: Diagnosis present

## 2021-06-14 DIAGNOSIS — F419 Anxiety disorder, unspecified: Secondary | ICD-10-CM | POA: Insufficient documentation

## 2021-06-14 DIAGNOSIS — Z79899 Other long term (current) drug therapy: Secondary | ICD-10-CM | POA: Insufficient documentation

## 2021-06-14 LAB — URINE DRUG SCREEN, QUALITATIVE (ARMC ONLY)
Amphetamines, Ur Screen: NOT DETECTED
Barbiturates, Ur Screen: NOT DETECTED
Benzodiazepine, Ur Scrn: NOT DETECTED
Cannabinoid 50 Ng, Ur ~~LOC~~: POSITIVE — AB
Cocaine Metabolite,Ur ~~LOC~~: NOT DETECTED
MDMA (Ecstasy)Ur Screen: NOT DETECTED
Methadone Scn, Ur: NOT DETECTED
Opiate, Ur Screen: NOT DETECTED
Phencyclidine (PCP) Ur S: NOT DETECTED
Tricyclic, Ur Screen: NOT DETECTED

## 2021-06-14 LAB — URINALYSIS, COMPLETE (UACMP) WITH MICROSCOPIC
Bacteria, UA: NONE SEEN
Bilirubin Urine: NEGATIVE
Glucose, UA: NEGATIVE mg/dL
Hgb urine dipstick: NEGATIVE
Ketones, ur: NEGATIVE mg/dL
Leukocytes,Ua: NEGATIVE
Nitrite: NEGATIVE
Protein, ur: NEGATIVE mg/dL
Specific Gravity, Urine: 1.019 (ref 1.005–1.030)
Squamous Epithelial / HPF: NONE SEEN (ref 0–5)
pH: 5 (ref 5.0–8.0)

## 2021-06-14 LAB — SALICYLATE LEVEL: Salicylate Lvl: <7 mg/dL — ABNORMAL LOW (ref 7.0–30.0)

## 2021-06-14 LAB — CHLAMYDIA/NGC RT PCR (ARMC ONLY)
Chlamydia Tr: NOT DETECTED
N gonorrhoeae: NOT DETECTED

## 2021-06-14 LAB — COMPREHENSIVE METABOLIC PANEL
ALT: 20 U/L (ref 0–44)
AST: 18 U/L (ref 15–41)
Albumin: 4.2 g/dL (ref 3.5–5.0)
Alkaline Phosphatase: 41 U/L (ref 38–126)
Anion gap: 6 (ref 5–15)
BUN: 11 mg/dL (ref 6–20)
CO2: 31 mmol/L (ref 22–32)
Calcium: 9.2 mg/dL (ref 8.9–10.3)
Chloride: 102 mmol/L (ref 98–111)
Creatinine, Ser: 0.93 mg/dL (ref 0.61–1.24)
GFR, Estimated: >60 mL/min (ref >60)
Glucose, Bld: 87 mg/dL (ref 70–99)
Potassium: 4 mmol/L (ref 3.5–5.1)
Sodium: 139 mmol/L (ref 135–145)
Total Bilirubin: 0.8 mg/dL (ref 0.3–1.2)
Total Protein: 7.5 g/dL (ref 6.5–8.1)

## 2021-06-14 LAB — RESP PANEL BY RT-PCR (FLU A&B, COVID) ARPGX2
Influenza A by PCR: NEGATIVE
Influenza B by PCR: NEGATIVE
SARS Coronavirus 2 by RT PCR: NEGATIVE

## 2021-06-14 LAB — ACETAMINOPHEN LEVEL: Acetaminophen (Tylenol), Serum: <10 ug/mL — ABNORMAL LOW (ref 10–30)

## 2021-06-14 LAB — ETHANOL: Alcohol, Ethyl (B): <10 mg/dL (ref <10)

## 2021-06-14 MED ORDER — QUETIAPINE FUMARATE 25 MG PO TABS
50.0000 mg | ORAL_TABLET | Freq: Two times a day (BID) | ORAL | Status: DC
  Administered 2021-06-14 – 2021-06-15 (×2): 50 mg via ORAL
  Filled 2021-06-14 (×2): qty 2

## 2021-06-14 MED ORDER — FLUOXETINE HCL 20 MG PO CAPS
20.0000 mg | ORAL_CAPSULE | Freq: Every day | ORAL | Status: DC
  Administered 2021-06-14 – 2021-06-15 (×2): 20 mg via ORAL
  Filled 2021-06-14 (×2): qty 1

## 2021-06-14 MED ORDER — DIVALPROEX SODIUM 500 MG PO DR TAB
500.0000 mg | DELAYED_RELEASE_TABLET | Freq: Two times a day (BID) | ORAL | Status: DC
  Administered 2021-06-14 – 2021-06-15 (×2): 500 mg via ORAL
  Filled 2021-06-14 (×2): qty 1

## 2021-06-14 NOTE — ED Provider Notes (Signed)
[facility] Emergency Department Provider Note ____________________________________________   Event Date/Time   First MD Initiated Contact with Patient 06/14/21 1357     (approximate)  I have reviewed the triage vital signs and the nursing notes.   HISTORY  Chief Complaint Depression and Mental Health Problem    HPI Timothy [name] is a 25 y.o. male with PMH as noted below including schizophrenia who reports increased depression and anxiety although denies SI.  He also complains that he has been feeling "shaky" over the last few days.  He states it has been colder than usual and he is living in a tent so could just be from the temperature, but states that he feels shaky not just when it is cold.  However, he denies fever, cough, shortness of breath, vomiting or diarrhea, or any other acute physical symptoms.  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 No pertinent surgical history.  Prior to Admission medications   Medication Sig Start Date End Date Taking? Authorizing Provider  divalproex (DEPAKOTE) 500 MG DR tablet Take 1 tablet (500 mg total) by mouth every 12 (twelve) hours. 11/06/20   Clapacs, Jackquline Denmark, MD  FLUoxetine (PROZAC) 40 MG capsule Take 1 capsule (40 mg total) by mouth daily. 11/06/20   Clapacs, Jackquline Denmark, MD  prazosin (MINIPRESS) 1 MG capsule Take 1 capsule (1 mg total) by mouth at bedtime. 11/06/20   Clapacs, Jackquline Denmark, MD  QUEtiapine  (SEROQUEL) 400 MG tablet Take 1 tablet (400 mg total) by mouth at bedtime. 11/06/20   Clapacs, Jackquline Denmark, MD  QUEtiapine (SEROQUEL) 50 MG tablet Take 1 tablet (50 mg total) by mouth 2 (two) times daily. 11/06/20   Clapacs, Jackquline Denmark, MD  traZODone (DESYREL) 50 MG tablet TAKE 1 TABLET BY MOUTH AT BEDTIME AS NEEDED FOR SLEEP. 08/25/20 08/25/21  Jesse Sans, MD  traZODone (DESYREL) 50 MG tablet Take 1 tablet (50 mg total) by mouth at bedtime as needed for sleep. 10/08/20   Jesse Sans, MD    Allergies Haldol [haloperidol lactate], Penicillins, and Zyprexa [olanzapine]  Family History  Problem Relation Age of Onset   Mental illness Brother    Drug abuse Brother    Mental illness Cousin    Suicidality Cousin    Diabetes Maternal Grandmother     Social History Social History   Tobacco Use   Smoking status: Some Days    Packs/day: 0.25    Types: Cigarettes   Smokeless tobacco: Never  Vaping Use   Vaping Use: Never used  Substance Use Topics   Alcohol use: Yes    Alcohol/week: 2.0 standard drinks    Types: 2 Cans of beer per week   Drug use: Yes    Types: Marijuana, MDMA (Ecstacy)    Review of Systems  Constitutional: No fever/chills Eyes: No visual changes. ENT: No sore throat. Cardiovascular: Denies chest pain. Respiratory: Denies shortness of breath. Gastrointestinal: No vomiting or diarrhea.  Genitourinary: Negative for dysuria.  Musculoskeletal: Negative for back pain.  Skin: Negative for rash. Neurological: Negative for headaches, focal weakness or numbness.   ____________________________________________   PHYSICAL EXAM:  VITAL SIGNS: ED Triage Vitals  Enc Vitals Group     BP 06/14/21 0800 108/68     Pulse Rate 06/14/21 0800 (!) 54     Resp --      Temp 06/14/21 0800 98.3 F (36.8 C)     Temp src --      SpO2 06/14/21 0800 100 %     Weight 06/14/21 0758 240 lb 4.8 oz (109 kg)     Height 06/14/21 0758 6\' 3"  (1.905 m)     Head Circumference --      Peak Flow  --      Pain Score 06/14/21 0758 0     Pain Loc --      Pain Edu? --      Excl. in GC? --     Constitutional: Alert and oriented. Well appearing and in no acute distress. Eyes: Conjunctivae are normal.  Head: Atraumatic. Nose: No congestion/rhinnorhea. Mouth/Throat: Mucous membranes are moist.   Neck: Normal range of motion.  Cardiovascular: Good peripheral circulation. Respiratory: Normal respiratory effort.   Gastrointestinal: No distention.  Musculoskeletal: Extremities warm and well perfused.  Neurologic:  Normal speech and language. No gross focal neurologic deficits are appreciated.  Skin:  Skin is warm and dry. No rash noted. Psychiatric: Flat affect.  Calm and cooperative.  ____________________________________________   LABS (all labs ordered are listed, but only abnormal results are displayed)  Labs Reviewed  URINALYSIS, COMPLETE (UACMP) WITH MICROSCOPIC - Abnormal; Notable for the following components:      Result Value   Color, Urine YELLOW (*)    APPearance CLEAR (*)    All other components within normal limits  URINE DRUG SCREEN, QUALITATIVE ([facility] ONLY) - Abnormal; Notable for the following components:   Cannabinoid 50 Ng, Ur [place] POSITIVE (*)    All other components within normal limits  ACETAMINOPHEN LEVEL - Abnormal; Notable for the following components:   Acetaminophen (Tylenol), Serum <10 (*)    All other components within normal limits  SALICYLATE LEVEL - Abnormal; Notable for the following components:   Salicylate Lvl <7.0 (*)    All other components within normal limits  RESP PANEL BY RT-PCR (FLU A&B, COVID) ARPGX2  CHLAMYDIA/NGC RT PCR ([facility] ONLY)            COMPREHENSIVE METABOLIC PANEL  ETHANOL  CBC   ____________________________________________  EKG   ____________________________________________  RADIOLOGY    ____________________________________________   PROCEDURES  Procedure(s) performed: No  Procedures  Critical Care  performed: No ____________________________________________   INITIAL IMPRESSION / ASSESSMENT AND PLAN / ED COURSE  Pertinent labs & imaging results that were available during my care of the patient were reviewed by me and considered in my medical decision making (see chart for details).   25 year old male with a history of schizophrenia presents with increased feelings of depression and anxiety although no SI.  He also reports feeling shaky but denies any other focal physical complaints.  The vital signs are normal and the exam is unremarkable.  Lab work-up obtained for medical clearance is within normal limits.  We will obtain psychiatry and TTS consults.  At this time there is no evidence of acute danger to self or others and no indication for IVC.  I suspect that the patient's feeling of shakiness is related to the lower temperature recently and living in a tent, rather than a  physical cause.  There is no evidence of any type of withdrawal.  We will obtain a respiratory panel to evaluate for viral etiologies.  Disposition will be based on psychiatry team recommendations.  The patient has been placed in psychiatric observation due to the need to provide a safe environment for the patient while obtaining psychiatric consultation and evaluation, as well as ongoing medical and medication management to treat the patient's condition.  The patient has not been placed under full IVC at this time.   ----------------------------------------- 3:06 PM on 06/14/2021 -----------------------------------------  Patient signed out to the oncoming ED physician Dr. Vicente Males.  ____________________________________________   FINAL CLINICAL IMPRESSION(S) / ED DIAGNOSES  Final diagnoses:  Shakiness  Anxiety      NEW MEDICATIONS STARTED DURING THIS VISIT:  New Prescriptions   No medications on file     Note:  This document was prepared using Dragon voice recognition software and may include  unintentional dictation errors.    Dionne Bucy, MD 06/14/21 1506

## 2021-06-14 NOTE — Consult Note (Signed)
[facility] Face-to-Face Psychiatry Consult   Reason for Consult:  Depression Referring Physician:  EDP Patient Identification: Timothy [name] MRN:  [id number] Principal Diagnosis: Schizoaffective disorder, depressive type Diagnosis:  Active Problems:   Schizoaffective disorder, depressive type (HCC)   Total Time spent with patient: 1 hour  Subjective:   Timothy [name] is a 25 y.o. male patient admitted with depression, history of schizoaffective disorder.  HPI:  25 yo male presents to the ED with an increase in depression, history of suicide attempts and schizoaffective disorder.  He has been living in a tent for the past few days because he was late to the shelter too many times.  Denies taking any medications as they make him "pass out", agreeable to start lower doses, history of Parkinsonism.  Long history of SMI with multiple suicide attempts and admission.  Reports high depression with suicidal ideations prior to coming into the ED.  Denies hallucinations but slow to respond, quiet voice, paranoid and guarded.  Hospitalization needed for stabilization.  Past Psychiatric History: schizoaffective disorder  Risk to Self:  yes Risk to Others:  none Prior Inpatient Therapy:  multiple Prior Outpatient Therapy:  none currently  Past Medical History:  Past Medical History:  Diagnosis Date   Cannabis abuse    Paranoid schizophrenia (HCC)    Schizoaffective disorder, depressive type (HCC) 09/17/2014   History reviewed. No pertinent surgical history. Family History:  Family History  Problem Relation Age of Onset   Mental illness Brother    Drug abuse Brother    Mental illness Cousin    Suicidality Cousin    Diabetes Maternal Grandmother    Family Psychiatric  History: see above Social History:  Social History   Substance and Sexual Activity  Alcohol Use Yes   Alcohol/week: 2.0 standard drinks   Types: 2 Cans of beer per week     Social History   Substance and Sexual Activity   Drug Use Yes   Types: Marijuana, MDMA Chiropodist)    Social History   Socioeconomic History   Marital status: Single    Spouse name: Not on file   Number of children: Not on file   Years of education: Not on file   Highest education level: Not on file  Occupational History   Not on file  Tobacco Use   Smoking status: Some Days    Packs/day: 0.25    Types: Cigarettes   Smokeless tobacco: Never  Vaping Use   Vaping Use: Never used  Substance and Sexual Activity   Alcohol use: Yes    Alcohol/week: 2.0 standard drinks    Types: 2 Cans of beer per week   Drug use: Yes    Types: Marijuana, MDMA (Ecstacy)   Sexual activity: Yes  Other Topics Concern   Not on file  Social History Narrative   Not on file   Social Determinants of Health   Financial Resource Strain: Not on file  Food Insecurity: Not on file  Transportation Needs: Not on file  Physical Activity: Not on file  Stress: Not on file  Social Connections: Not on file   Additional Social History:    Allergies:   Allergies  Allergen Reactions   Haldol [Haloperidol Lactate] Other (See Comments)    Muscle stiffness   Penicillins Hives    Felt like throat was closing Has patient had a PCN reaction causing immediate rash, facial/tongue/throat swelling, SOB or lightheadedness with hypotension: Unknown Has patient had a PCN reaction causing severe rash involving  mucus membranes or skin necrosis: unknown Has patient had a PCN reaction that required hospitalization Unknown Has patient had a PCN reaction occurring within the last 10 years: Unknown If all of the above answers are "NO", then may proceed with Cephalosporin use.    Zyprexa [Olanzapine]     eps    Labs:  Results for orders placed or performed during the hospital encounter of 06/14/21 (from the past 48 hour(s))  Comprehensive metabolic panel     Status: None   Collection Time: 06/14/21  8:19 AM  Result Value Ref Range   Sodium 139 135 - 145 mmol/L    Potassium 4.0 3.5 - 5.1 mmol/L   Chloride 102 98 - 111 mmol/L   CO2 31 22 - 32 mmol/L   Glucose, Bld 87 70 - 99 mg/dL    Comment: Glucose reference range applies only to samples taken after fasting for at least 8 hours.   BUN 11 6 - 20 mg/dL   Creatinine, Ser 4.58 0.61 - 1.24 mg/dL   Calcium 9.2 8.9 - 09.9 mg/dL   Total Protein 7.5 6.5 - 8.1 g/dL   Albumin 4.2 3.5 - 5.0 g/dL   AST 18 15 - 41 U/L   ALT 20 0 - 44 U/L   Alkaline Phosphatase 41 38 - 126 U/L   Total Bilirubin 0.8 0.3 - 1.2 mg/dL   GFR, Estimated >83 >38 mL/min    Comment: (NOTE) Calculated using the CKD-EPI Creatinine Equation (2021)    Anion gap 6 5 - 15    Comment: Performed at [facility], [address] [place], [place] [address]  Ethanol     Status: None   Collection Time: 06/14/21  8:19 AM  Result Value Ref Range   Alcohol, Ethyl (B) <10 <10 mg/dL    Comment: (NOTE) Lowest detectable limit for serum alcohol is 10 mg/dL.  For medical purposes only. Performed at [facility], [address], [place], [place] [address]   Acetaminophen level     Status: Abnormal   Collection Time: 06/14/21  8:19 AM  Result Value Ref Range   Acetaminophen (Tylenol), Serum <10 (L) 10 - 30 ug/mL    Comment: (NOTE) Therapeutic concentrations vary significantly. A range of 10-30 ug/mL  may be an effective concentration for many patients. However, some  are best treated at concentrations outside of this range. Acetaminophen concentrations >150 ug/mL at 4 hours after ingestion  and >50 ug/mL at 12 hours after ingestion are often associated with  toxic reactions.  Performed at [facility], [address] [place], [place] [address]   Salicylate level     Status: Abnormal   Collection Time: 06/14/21  8:19 AM  Result Value Ref Range   Salicylate Lvl <7.0 (L) 7.0 - 30.0 mg/dL    Comment: Performed at [facility], [address] [place], [place] [address]  Urinalysis, Complete w  Microscopic     Status: Abnormal   Collection Time: 06/14/21  8:22 AM  Result Value Ref Range   Color, Urine YELLOW (A) YELLOW   APPearance CLEAR (A) CLEAR   Specific Gravity, Urine 1.019 1.005 - 1.030   pH 5.0 5.0 - 8.0   Glucose, UA NEGATIVE NEGATIVE mg/dL   Hgb urine dipstick NEGATIVE NEGATIVE   Bilirubin Urine NEGATIVE NEGATIVE   Ketones, ur NEGATIVE NEGATIVE mg/dL   Protein, ur NEGATIVE NEGATIVE mg/dL   Nitrite NEGATIVE NEGATIVE   Leukocytes,Ua NEGATIVE NEGATIVE   WBC, UA 0-5 0 -  5 WBC/hpf   Bacteria, UA NONE SEEN NONE SEEN   Squamous Epithelial / LPF NONE SEEN 0 - 5   Mucus PRESENT     Comment: Performed at [facility], [address] [place], [place] [address]  Urine Drug Screen, Qualitative     Status: Abnormal   Collection Time: 06/14/21  8:22 AM  Result Value Ref Range   Tricyclic, Ur Screen NONE DETECTED NONE DETECTED   Amphetamines, Ur Screen NONE DETECTED NONE DETECTED   MDMA (Ecstasy)Ur Screen NONE DETECTED NONE DETECTED   Cocaine Metabolite,Ur [place] NONE DETECTED NONE DETECTED   Opiate, Ur Screen NONE DETECTED NONE DETECTED   Phencyclidine (PCP) Ur S NONE DETECTED NONE DETECTED   Cannabinoid 50 Ng, Ur [place] POSITIVE (A) NONE DETECTED   Barbiturates, Ur Screen NONE DETECTED NONE DETECTED   Benzodiazepine, Ur Scrn NONE DETECTED NONE DETECTED   Methadone Scn, Ur NONE DETECTED NONE DETECTED    Comment: (NOTE) Tricyclics + metabolites, urine    Cutoff 1000 ng/mL Amphetamines + metabolites, urine  Cutoff 1000 ng/mL MDMA (Ecstasy), urine              Cutoff 500 ng/mL Cocaine Metabolite, urine          Cutoff 300 ng/mL Opiate + metabolites, urine        Cutoff 300 ng/mL Phencyclidine (PCP), urine         Cutoff 25 ng/mL Cannabinoid, urine                 Cutoff 50 ng/mL Barbiturates + metabolites, urine  Cutoff 200 ng/mL Benzodiazepine, urine              Cutoff 200 ng/mL Methadone, urine                   Cutoff 300 ng/mL  The urine drug screen provides only  a preliminary, unconfirmed analytical test result and should not be used for non-medical purposes. Clinical consideration and professional judgment should be applied to any positive drug screen result due to possible interfering substances. A more specific alternate chemical method must be used in order to obtain a confirmed analytical result. Gas chromatography / mass spectrometry (GC/MS) is the preferred confirm atory method. Performed at [facility], [address] [place], [place] [address]   Resp Panel by RT-PCR (Flu A&B, Covid) Nasopharyngeal Swab     Status: None   Collection Time: 06/14/21  2:17 PM   Specimen: Nasopharyngeal Swab; Nasopharyngeal(NP) swabs in vial transport medium  Result Value Ref Range   SARS Coronavirus 2 by RT PCR NEGATIVE NEGATIVE    Comment: (NOTE) SARS-CoV-2 target nucleic acids are NOT DETECTED.  The SARS-CoV-2 RNA is generally detectable in upper respiratory specimens during the acute phase of infection. The lowest concentration of SARS-CoV-2 viral copies this assay can detect is 138 copies/mL. A negative result does not preclude SARS-Cov-2 infection and should not be used as the sole basis for treatment or other patient management decisions. A negative result may occur with  improper specimen collection/handling, submission of specimen other than nasopharyngeal swab, presence of viral mutation(s) within the areas targeted by this assay, and inadequate number of viral copies(<138 copies/mL). A negative result must be combined with clinical observations, patient history, and epidemiological information. The expected result is Negative.  Fact Sheet for Patients:  BloggerCourse.com  Fact Sheet for Healthcare Providers:  SeriousBroker.it  This test is no t yet approved or cleared by the [place] and  has been authorized for detection and/or diagnosis of SARS-CoV-2 by FDA under an  Emergency Use Authorization (EUA). This EUA will remain  in effect (meaning this test can be used) for the duration of the COVID-19 declaration under Section 564(b)(1) of the Act, 21 U.S.C.section 360bbb-3(b)(1), unless the authorization is terminated  or revoked sooner.       Influenza A by PCR NEGATIVE NEGATIVE   Influenza B by PCR NEGATIVE NEGATIVE    Comment: (NOTE) The Xpert Xpress SARS-CoV-2/FLU/RSV plus assay is intended as an aid in the diagnosis of influenza from Nasopharyngeal swab specimens and should not be used as a sole basis for treatment. Nasal washings and aspirates are unacceptable for Xpert Xpress SARS-CoV-2/FLU/RSV testing.  Fact Sheet for Patients: BloggerCourse.com  Fact Sheet for Healthcare Providers: SeriousBroker.it  This test is not yet approved or cleared by the [place] FDA and has been authorized for detection and/or diagnosis of SARS-CoV-2 by FDA under an Emergency Use Authorization (EUA). This EUA will remain in effect (meaning this test can be used) for the duration of the COVID-19 declaration under Section 564(b)(1) of the Act, 21 U.S.C. section 360bbb-3(b)(1), unless the authorization is terminated or revoked.  Performed at [facility], [address] [place], [place] [address]     Current Facility-Administered Medications  Medication Dose Route Frequency Provider Last Rate Last Admin   divalproex (DEPAKOTE) DR tablet 500 mg  500 mg Oral Q12H Kathlyne Loud, Herminio Heads, NP       FLUoxetine (PROZAC) capsule 20 mg  20 mg Oral Daily Charm Rings, NP       QUEtiapine (SEROQUEL) tablet 50 mg  50 mg Oral BID Charm Rings, NP       Current Outpatient Medications  Medication Sig Dispense Refill   divalproex (DEPAKOTE) 500 MG DR tablet Take 1 tablet (500 mg total) by mouth every 12 (twelve) hours. 60 tablet 1   FLUoxetine (PROZAC) 40 MG capsule Take 1 capsule (40 mg total) by mouth  daily. 30 capsule 1   prazosin (MINIPRESS) 1 MG capsule Take 1 capsule (1 mg total) by mouth at bedtime. 30 capsule 1   QUEtiapine (SEROQUEL) 400 MG tablet Take 1 tablet (400 mg total) by mouth at bedtime. 30 tablet 1   QUEtiapine (SEROQUEL) 50 MG tablet Take 1 tablet (50 mg total) by mouth 2 (two) times daily. 60 tablet 1   traZODone (DESYREL) 50 MG tablet TAKE 1 TABLET BY MOUTH AT BEDTIME AS NEEDED FOR SLEEP. 7 tablet 0   traZODone (DESYREL) 50 MG tablet Take 1 tablet (50 mg total) by mouth at bedtime as needed for sleep. 30 tablet 1    Musculoskeletal: Strength & Muscle Tone: within normal limits Gait & Station: normal Patient leans: N/A  Psychiatric Specialty Exam: Physical Exam Vitals and nursing note reviewed.  Constitutional:      Appearance: Normal appearance.  HENT:     Head: Normocephalic.     Nose: Nose normal.  Pulmonary:     Effort: Pulmonary effort is normal.  Musculoskeletal:        General: Normal range of motion.     Cervical back: Normal range of motion.  Neurological:     General: No focal deficit present.     Mental Status: He is alert and oriented to person, place, and time.  Psychiatric:        Attention and Perception: He is inattentive.        Mood and Affect: Mood is anxious and  depressed. Affect is blunt.        Speech: Speech is delayed.        Behavior: Behavior is slowed.        Thought Content: Thought content normal.        Cognition and Memory: Cognition and memory normal.        Judgment: Judgment normal. Judgment is not inappropriate.    Review of Systems  Psychiatric/Behavioral:  Positive for depression. The patient is nervous/anxious.   All other systems reviewed and are negative.  Blood pressure 108/68, pulse (!) 54, temperature 98.3 F (36.8 C), height 6\' 3"  (1.905 m), weight 109 kg, SpO2 100 %.Body mass index is 30.04 kg/m.  General Appearance: Casual  Eye Contact:  Fair  Speech:  Slow  Volume:  Decreased  Mood:  Depressed   Affect:  Blunt  Thought Process:  Coherent  Orientation:  Full (Time, Place, and Person)  Thought Content:  Paranoid Ideation  Suicidal Thoughts:  No  Homicidal Thoughts:  No  Memory:  Immediate;   Fair Recent;   Fair Remote;   Fair  Judgement:  Poor  Insight:  Lacking  Psychomotor Activity:  Decreased  Concentration:  Concentration: Fair and Attention Span: Fair  Recall:  of Knowledge:  Fair  Language:  Fair  Akathisia:  No  Handed:  Right  AIMS (if indicated):     Assets:  Leisure Time Physical Health Resilience  ADL's:  Intact  Cognition:  Impaired,  Mild  Sleep:      cl  Physical Exam: Physical Exam Vitals and nursing note reviewed.  Constitutional:      Appearance: Normal appearance.  HENT:     Head: Normocephalic.     Nose: Nose normal.  Pulmonary:     Effort: Pulmonary effort is normal.  Musculoskeletal:        General: Normal range of motion.     Cervical back: Normal range of motion.  Neurological:     General: No focal deficit present.     Mental Status: He is alert and oriented to person, place, and time.  Psychiatric:        Attention and Perception: He is inattentive.        Mood and Affect: Mood is anxious and depressed. Affect is blunt.        Speech: Speech is delayed.        Behavior: Behavior is slowed.        Thought Content: Thought content normal.        Cognition and Memory: Cognition and memory normal.        Judgment: Judgment normal. Judgment is not inappropriate.   Review of Systems  Psychiatric/Behavioral:  Positive for depression. The patient is nervous/anxious.   All other systems reviewed and are negative. Blood pressure 108/68, pulse (!) 54, temperature 98.3 F (36.8 C), height 6\' 3"  (1.905 m), weight 109 kg, SpO2 100 %. Body mass index is 30.04 kg/m.  Treatment Plan Summary: Daily contact with patient to assess and evaluate symptoms and progress in treatment, Medication management, and Plan : Schizoaffective  disorder, depressive type: -Restarted Depakote 500 mg BID -Restarted Prozac at 20 mg daily -Restarted Seroquel 50 mg daily at bedtime -Admit to inpatient psychiatric unit for stabilization  Disposition: Recommend psychiatric Inpatient admission when medically cleared.  [employer], NP 06/14/2021 3:46 PM
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
## 2021-06-14 NOTE — ED Notes (Signed)
This RN and Dr. Marisa Severin went over to speak with pt. Pt states that he is coming in today because he has been staying in a tent outside and he was concerned because he was feeling shaky.   Pt denies pain anywhere. Pt denies SI/HI. Pt denies having any kind of psych issue.  When pt was asked by Dr. Marisa Severin if he told staff in triage he was more depressed and want to speak with someone in psychiatry, pt stated yes.  Pt was given soda to drink. Pt waiting on psych consult.

## 2021-06-14 NOTE — ED Triage Notes (Signed)
Pt states has been living in outside in a tent for several days. Pt states he is depressed, cold and hungry. Denies SI, but when asked about HI, pt smiled and did not answer

## 2021-06-15 ENCOUNTER — Encounter: Payer: Self-pay | Admitting: Psychiatry

## 2021-06-15 ENCOUNTER — Other Ambulatory Visit: Payer: Self-pay

## 2021-06-15 ENCOUNTER — Inpatient Hospital Stay
Admission: AD | Admit: 2021-06-15 | Discharge: 2021-06-26 | DRG: 885 | Disposition: A | Discharge status: Home / Self Care | Payer: 59 | Source: Intra-hospital | Attending: Psychiatry | Admitting: Psychiatry

## 2021-06-15 DIAGNOSIS — M549 Dorsalgia, unspecified: Secondary | ICD-10-CM | POA: Diagnosis present

## 2021-06-15 DIAGNOSIS — Z5900 Homelessness unspecified: Secondary | ICD-10-CM

## 2021-06-15 DIAGNOSIS — Z813 Family history of other psychoactive substance abuse and dependence: Secondary | ICD-10-CM

## 2021-06-15 DIAGNOSIS — Z818 Family history of other mental and behavioral disorders: Secondary | ICD-10-CM

## 2021-06-15 DIAGNOSIS — Z91199 Patient's noncompliance with other medical treatment and regimen due to unspecified reason: Secondary | ICD-10-CM

## 2021-06-15 DIAGNOSIS — Z5902 Unsheltered homelessness: Secondary | ICD-10-CM | POA: Diagnosis not present

## 2021-06-15 DIAGNOSIS — F1210 Cannabis abuse, uncomplicated: Secondary | ICD-10-CM | POA: Diagnosis present

## 2021-06-15 DIAGNOSIS — Z638 Other specified problems related to primary support group: Secondary | ICD-10-CM | POA: Diagnosis not present

## 2021-06-15 DIAGNOSIS — Z79899 Other long term (current) drug therapy: Secondary | ICD-10-CM | POA: Diagnosis not present

## 2021-06-15 DIAGNOSIS — F259 Schizoaffective disorder, unspecified: Secondary | ICD-10-CM | POA: Diagnosis present

## 2021-06-15 DIAGNOSIS — F250 Schizoaffective disorder, bipolar type: Secondary | ICD-10-CM | POA: Diagnosis not present

## 2021-06-15 MED ORDER — FLUOXETINE HCL 20 MG PO CAPS
20.0000 mg | ORAL_CAPSULE | Freq: Every day | ORAL | Status: DC
  Administered 2021-06-16 – 2021-06-26 (×11): 20 mg via ORAL
  Filled 2021-06-15 (×11): qty 1

## 2021-06-15 MED ORDER — MAGNESIUM HYDROXIDE 400 MG/5ML PO SUSP: 30.0000 mL | Freq: Every day | ORAL | Status: DC | PRN

## 2021-06-15 MED ORDER — QUETIAPINE FUMARATE 25 MG PO TABS
50.0000 mg | ORAL_TABLET | Freq: Two times a day (BID) | ORAL | Status: DC
  Administered 2021-06-15 – 2021-06-26 (×22): 50 mg via ORAL
  Filled 2021-06-15 (×22): qty 2

## 2021-06-15 MED ORDER — DIVALPROEX SODIUM 500 MG PO DR TAB
500.0000 mg | DELAYED_RELEASE_TABLET | Freq: Two times a day (BID) | ORAL | Status: DC
  Administered 2021-06-15 – 2021-06-26 (×22): 500 mg via ORAL
  Filled 2021-06-15 (×22): qty 1

## 2021-06-15 MED ORDER — ALUM & MAG HYDROXIDE-SIMETH 200-200-20 MG/5ML PO SUSP: 30.0000 mL | ORAL | Status: DC | PRN

## 2021-06-15 MED ORDER — ACETAMINOPHEN 325 MG PO TABS
650.0000 mg | ORAL_TABLET | Freq: Four times a day (QID) | ORAL | Status: DC | PRN
  Administered 2021-06-16 – 2021-06-23 (×9): 650 mg via ORAL
  Filled 2021-06-15 (×9): qty 2

## 2021-06-15 NOTE — Tx Team (Signed)
Initial Treatment Plan 06/15/2021 5:12 PM Timothy [name] [id number]    PATIENT STRESSORS: Financial difficulties   Medication change or noncompliance   Substance abuse   Other: Accomodation     PATIENT STRENGTHS: Ability for insight  Capable of independent living  Communication skills  General fund of knowledge    PATIENT IDENTIFIED PROBLEMS: "I live in a tent it was cold I was shaking"  "I am depressed"  Medication non compliance                 DISCHARGE CRITERIA:  Ability to meet basic life and health needs Improved stabilization in mood, thinking, and/or behavior Motivation to continue treatment in a less acute level of care Reduction of life-threatening or endangering symptoms to within safe limits Verbal commitment to aftercare and medication compliance  PRELIMINARY DISCHARGE PLAN: Attend aftercare/continuing care group Outpatient therapy Placement in alternative living arrangements  PATIENT/FAMILY INVOLVEMENT: This treatment plan has been presented to and reviewed with the patient, Timothy [name], and/or family member.  The patient and family have been given the opportunity to ask questions and make suggestions.  Margarita Rana, RN 06/15/2021, 5:12 PM

## 2021-06-15 NOTE — BH Assessment (Addendum)
Patient is to be admitted to [facility] BMU today 06/15/21 by Dr. Toni Amend.  Attending Physician will be Dr.  Toni Amend .   Patient has not been assigned a room, by [facility] Charge Nurse, Mimi.    ER staff is aware of the admission: Misty Stanley, ER Secretary   Dr. Larinda Buttery,, ER MD  Connye Burkitt, Patient's Nurse  Sue Lush, Patient Access.

## 2021-06-15 NOTE — ED Notes (Signed)
Hospital meal provided.  100% consumed, pt tolerated w/o complaints.  Waste discarded appropriately.   

## 2021-06-15 NOTE — ED Notes (Signed)
Dressed out without incident by this Charity fundraiser. 2 bags labeled. 1 white hospital belonging bag to include shoes, shorts, pants, socks, jacket, shirt, belt. 2nd bag is camo book bag.

## 2021-06-15 NOTE — ED Notes (Signed)
Breakfast tray was given with juice. 

## 2021-06-15 NOTE — ED Provider Notes (Signed)
Emergency Medicine Observation Re-evaluation Note  Timothy [name] is a 25 y.o. male, seen on rounds today.  Pt initially presented to the ED for complaints of Depression and Mental Health Problem Currently, the patient is resting, voices no medical complaints.  Physical Exam  BP (!) 119/58 (BP Location: Left Arm)   Pulse 65   Temp 97.8 F (36.6 C) (Oral)   Resp 16   Ht 6\' 3"  (1.905 m)   Wt 109 kg   SpO2 100%   BMI 30.04 kg/m  Physical Exam General: Resting in no acute distress Cardiac: No cyanosis Lungs: Equal rise and fall Psych: Not agitated  ED Course / MDM  EKG:   I have reviewed the labs performed to date as well as medications administered while in observation.  Recent changes in the last 24 hours include no events overnight.  Plan  Current plan is for psychiatric disposition.  is not under involuntary commitment.     [name], MD 06/15/21 [phone]

## 2021-06-15 NOTE — ED Notes (Signed)
Patient is taking shower. Patient has been given clean clothes and returned all items used.

## 2021-06-15 NOTE — Progress Notes (Signed)
25 Y.O. male admitted to the unit from [facility] ED with the admitting diagnosis of schizoaffective disorder depressive type. Patient is alert oriented X4 . Patient appeared depressed, but calm and cooperative,with flat affect. Speech clear but slow. Patient stated "Live in a tent, because I was late to the shelter". Patient verbalize depression. Denies SI, HI, and AVH. Denies pain or discomfort at this time. Orientation provided to the unit provided. Skin assessment done. Patient observed with dry scaly skin and callus to bilateral sole of feet. Patient report he has eczema. Ate supper in the dining area among staff and peers and tolerated well. Patient is currently sitting in the day room among staff and peers. Patient remain safe in the unit with 15 minutes checks.

## 2021-06-15 NOTE — Progress Notes (Signed)
Patient calm and cooperative during assessment denying SI/HI/AVH. Pt endorses depression. Pt presents with a flat affect. Pt observed interacting appropriately with staff and peers on the unit. Pt compliant with medication administration per MD orders. Pt given education, support, and encouragement to be active in his treatment plan. Pt being monitored Q 15 minutes for safety per unit protocol. Pt remains safe on the unit.

## 2021-06-15 NOTE — ED Notes (Signed)
Pt given lunch tray and sprite to drink.

## 2021-06-15 NOTE — ED Notes (Signed)
Sent downstairs to BMU via wheelchair, ED tech and security. Sent with 2 bags of belongings

## 2021-06-15 NOTE — ED Notes (Addendum)
Snack placed at side, pt sleeping.

## 2021-06-15 NOTE — ED Notes (Signed)
Lunch trash removed.

## 2021-06-16 ENCOUNTER — Encounter: Payer: Self-pay | Admitting: Psychiatry

## 2021-06-16 DIAGNOSIS — F250 Schizoaffective disorder, bipolar type: Secondary | ICD-10-CM | POA: Diagnosis not present

## 2021-06-16 MED ORDER — QUETIAPINE FUMARATE 100 MG PO TABS
100.0000 mg | ORAL_TABLET | Freq: Every day | ORAL | Status: DC
  Administered 2021-06-16 – 2021-06-18 (×3): 100 mg via ORAL
  Filled 2021-06-16 (×3): qty 1

## 2021-06-16 MED ORDER — NICOTINE 14 MG/24HR TD PT24
14.0000 mg | MEDICATED_PATCH | Freq: Every day | TRANSDERMAL | Status: DC
  Administered 2021-06-16 – 2021-06-26 (×11): 14 mg via TRANSDERMAL
  Filled 2021-06-16 (×11): qty 1

## 2021-06-16 NOTE — Group Note (Signed)
[facility] LCSW Group Therapy Note ° ° °Group Date: 06/16/2021 °Start Time: 1300 °End Time: 1400 ° °Type of Therapy/Topic:  Group Therapy:  Feelings about Diagnosis ° °Participation Level:  Did Not Attend  ° °Mood: n/a  ° ° °Description of Group:   ° This group will allow patients to explore their thoughts and feelings about diagnoses they have received. Patients will be guided to explore their level of understanding and acceptance of these diagnoses. Facilitator will encourage patients to process their thoughts and feelings about the reactions of others to their diagnosis, and will guide patients in identifying ways to discuss their diagnosis with significant others in their lives. This group will be process-oriented, with patients participating in exploration of their own experiences as well as giving and receiving support and challenge from other group members. ° ° °Therapeutic Goals: °1. Patient will demonstrate understanding of diagnosis as evidence by identifying two or more symptoms of the disorder:  °2. Patient will be able to express two feelings regarding the diagnosis °3. Patient will demonstrate ability to communicate their needs through discussion and/or role plays ° °Summary of Patient Progress: °Patient did not attend group despite encouraged participation.   ° ° ° °Therapeutic Modalities:   °Cognitive Behavioral Therapy °Brief Therapy °Feelings Identification  ° ° °Timothy [name], LCSWA °

## 2021-06-16 NOTE — Progress Notes (Signed)
Recreation Therapy Notes  INPATIENT RECREATION THERAPY ASSESSMENT  Patient Details Name: Timothy [name] MRN: [id number] DOB: [birth year] Today's Date: 06/16/2021       Information Obtained From: Patient  Able to Participate in Assessment/Interview: Yes  Patient Presentation: Responsive  Reason for Admission (Per Patient): Patient Unable to Identify  Patient Stressors:    Coping Skills:   Isolation, Exercise, Avoidance  Leisure Interests (2+):  Art - Draw, Music - Listen  Frequency of Recreation/Participation: Weekly  Awareness of Community Resources:  No  Community Resources:     Current Use:    If no, Barriers?:    Expressed Interest in [employer] Information: No  [place]  Patient Main Form of Transportation: Therapist, music  Patient Strengths:  Kind,Trustworthy  Patient Identified Areas of Improvement:  N/A  Patient Goal for Hospitalization:  To get better  Current SI (including self-harm):  No  Current HI:  No  Current AVH: No  Staff Intervention Plan: Group Attendance, Collaborate with Interdisciplinary Treatment Team  Consent to Intern Participation: N/A  Macallan Ord 06/16/2021, 12:19 PM

## 2021-06-16 NOTE — [facility] Suicide Risk Assessment (Signed)
[facility] Admission Suicide Risk Assessment   Nursing information obtained from:  Patient Demographic factors:  Male, Adolescent or young adult, Low socioeconomic status, Living alone, Unemployed Current Mental Status:  NA Loss Factors:  NA Historical Factors:  Family history of mental illness or substance abuse, Impulsivity Risk Reduction Factors:  NA  Total Time spent with patient: 1 hour Principal Problem: Schizoaffective disorder, bipolar type (HCC) Diagnosis:  Principal Problem:   Schizoaffective disorder, bipolar type (HCC) Active Problems:   Homelessness  Subjective Data: Patient seen and chart reviewed.  25 year old man with a known history of schizoaffective disorder with a tendency towards depression brought himself to the hospital complaining of depressed mood also exhibiting paranoia and confusion poor self-care and requesting to be back on medication.  Patient denies suicidal intent currently.  He expresses agreement to the idea of being back on medication although he also complains of hopelessness.  So far behavior has been calm and cooperative  Continued Clinical Symptoms:  Alcohol Use Disorder Identification Test Final Score (AUDIT): 0 The "Alcohol Use Disorders Identification Test", Guidelines for Use in Primary Care, Second Edition.  World Science writer [facility]). Score between 0-7:  no or low risk or alcohol related problems. Score between 8-15:  moderate risk of alcohol related problems. Score between 16-19:  high risk of alcohol related problems. Score 20 or above:  warrants further diagnostic evaluation for alcohol dependence and treatment.   CLINICAL FACTORS:   Bipolar Disorder:   Depressive phase Schizophrenia:   Depressive state   Musculoskeletal: Strength & Muscle Tone: within normal limits Gait & Station: normal Patient leans: N/A  Psychiatric Specialty Exam:  Presentation  General Appearance: Appropriate for Environment  Eye  Contact:Fair  Speech:Normal Rate  Speech Volume:Normal  Handedness:Right   Mood and Affect  Mood:Euthymic  Affect:Appropriate   Thought Process  Thought Processes:Coherent  Descriptions of Associations:Intact  Orientation:Full (Time, Place and Person)  Thought Content:Logical  History of Schizophrenia/Schizoaffective disorder:Yes  Duration of Psychotic Symptoms:Greater than six months  Hallucinations:No data recorded Ideas of Reference:None  Suicidal Thoughts:No data recorded Homicidal Thoughts:No data recorded  Sensorium  Memory:Immediate Good; Recent Good; Remote Good  Judgment:Fair  Insight:Fair   Executive Functions  Concentration:Good  Attention Span:Good  Recall:Fair  Fund of Knowledge:Fair  Language:Good   Psychomotor Activity  Psychomotor Activity:No data recorded  Assets  Assets:Communication Skills; Desire for Improvement; Financial Resources/Insurance; Resilience; Social Support   Sleep  Sleep:No data recorded   Physical Exam: Physical Exam Vitals and nursing note reviewed.  Constitutional:      Appearance: Normal appearance.  HENT:     Head: Normocephalic and atraumatic.     Mouth/Throat:     Pharynx: Oropharynx is clear.  Eyes:     Pupils: Pupils are equal, round, and reactive to light.  Cardiovascular:     Rate and Rhythm: Normal rate and regular rhythm.  Pulmonary:     Effort: Pulmonary effort is normal.     Breath sounds: Normal breath sounds.  Abdominal:     General: Abdomen is flat.     Palpations: Abdomen is soft.  Musculoskeletal:        General: Normal range of motion.  Skin:    General: Skin is warm and dry.  Neurological:     General: No focal deficit present.     Mental Status: He is alert. Mental status is at baseline.  Psychiatric:        Attention and Perception: He is inattentive.  Mood and Affect: Mood is depressed. Affect is blunt.        Speech: Speech is delayed.        Behavior:  Behavior is slowed.        Thought Content: Thought content is paranoid.        Cognition and Memory: Cognition is impaired.        Judgment: Judgment is inappropriate.   Review of Systems  Constitutional: Negative.   HENT: Negative.    Eyes: Negative.   Respiratory: Negative.    Cardiovascular: Negative.   Gastrointestinal: Negative.   Musculoskeletal: Negative.   Skin: Negative.   Neurological: Negative.   Psychiatric/Behavioral:  Positive for depression. The patient is nervous/anxious and has insomnia.   Blood pressure 108/64, pulse 78, temperature 98.6 F (37 C), temperature source Oral, resp. rate 18, height 6\' 3"  (1.905 m), weight 93.4 kg, SpO2 99 %. Body mass index is 25.75 kg/m.   COGNITIVE FEATURES THAT CONTRIBUTE TO RISK:  Closed-mindedness and Thought constriction (tunnel vision)    SUICIDE RISK:   Mild:  Suicidal ideation of limited frequency, intensity, duration, and specificity.  There are no identifiable plans, no associated intent, mild dysphoria and related symptoms, good self-control (both objective and subjective assessment), few other risk factors, and identifiable protective factors, including available and accessible social support.  PLAN OF CARE: Supportive counseling and review of treatment plan with patient.  Meet with treatment team tomorrow.  Restart medications for treatment of symptoms of psychotic depression.  Daily reassessment of dangerousness prior to discharge  I certify that inpatient services furnished can reasonably be expected to improve the patient's condition.   , MD 06/16/2021, 4:59 PM

## 2021-06-16 NOTE — H&P (Signed)
Psychiatric Admission Assessment Adult  Patient Identification: Timothy [name] MRN:  [id number] Date of Evaluation:  06/16/2021 Chief Complaint:  Schizoaffective disorder Principal Diagnosis: Schizoaffective disorder, bipolar type (HCC) Diagnosis:  Principal Problem:   Schizoaffective disorder, bipolar type (HCC) Active Problems:   Homelessness  History of Present Illness: Patient seen and chart reviewed.  Patient known from previous encounters.  25 year old man with a history of chronic mental illness brought himself voluntarily to the emergency room complaining of being depressed.  Patient tells me that he has been depressed recently and that he lives in a tent out in the woods.  He has been getting colder and he felt hopeless.  He presents his reasons for coming to the hospital as both his bad mood but also that he wanted shelter and has been unable to care for himself.  Patient is not a very forthcoming historian.  Makes no eye contact during the conversation.  Often mutters or eludes to things he is paranoid about that he will not elaborate on in detail.  Talks about how people are stealing his money or cheating him but will not answer any specific questions about it.  Has not been on any medication probably since his last hospitalization.  Patient denies that he has been drinking or using any drugs recently.  Says that he is completely estranged from his family now and has had no contact with his mother or uncle.  Denies active suicidal intent.  Expresses hopelessness.  Passively agreed to medication but says he thought it would be of no benefit.  Could not give me a more clear response as to how I could be of help to him. Associated Signs/Symptoms: Depression Symptoms:  depressed mood, anhedonia, psychomotor retardation, feelings of worthlessness/guilt, difficulty concentrating, hopelessness, anxiety, loss of energy/fatigue, Duration of Depression Symptoms: Greater than two  weeks  (Hypo) Manic Symptoms:  Irritable Mood, Anxiety Symptoms:  Excessive Worry, Psychotic Symptoms:  Paranoia, PTSD Symptoms: Negative Total Time spent with patient: 1 hour  Past Psychiatric History: Patient has a history of mental health problems going back to his adolescent years which has been diagnosed as schizophrenia or schizoaffective disorder.  Chronic presentations with psychotic symptoms.  Has shown responses when he is on medication in the hospital but has a long history of noncompliance with treatment.  In the past has exhibited aggressive behavior especially with his family.  No known suicide attempts.  Past history of some cannabis overuse but he denies he has been using drugs recently.  Is the patient at risk to self? Yes.    Has the patient been a risk to self in the past 6 months? Yes.    Has the patient been a risk to self within the distant past? Yes.    Is the patient a risk to others? No.  Has the patient been a risk to others in the past 6 months? No.  Has the patient been a risk to others within the distant past? Yes.     Prior Inpatient Therapy:   Prior Outpatient Therapy:    Alcohol Screening: 1. How often do you have a drink containing alcohol?: Never 2. How many drinks containing alcohol do you have on a typical day when you are drinking?: 1 or 2 3. How often do you have six or more drinks on one occasion?: Never AUDIT-C Score: 0 4. How often during the last year have you found that you were not able to stop drinking once you had started?: Never 5.  How often during the last year have you failed to do what was normally expected from you because of drinking?: Never 6. How often during the last year have you needed a first drink in the morning to get yourself going after a heavy drinking session?: Never 7. How often during the last year have you had a feeling of guilt of remorse after drinking?: Never 8. How often during the last year have you been unable to  remember what happened the night before because you had been drinking?: Never 9. Have you or someone else been injured as a result of your drinking?: No 10. Has a relative or friend or a doctor or another health worker been concerned about your drinking or suggested you cut down?: No Alcohol Use Disorder Identification Test Final Score (AUDIT): 0 Substance Abuse History in the last 12 months:  Yes.   Consequences of Substance Abuse: Cannabis abuse frequently complicating illness Previous Psychotropic Medications: Yes  Psychological Evaluations: Yes  Past Medical History:  Past Medical History:  Diagnosis Date   Cannabis abuse    Paranoid schizophrenia (HCC)    Schizoaffective disorder, depressive type (HCC) 09/17/2014   History reviewed. No pertinent surgical history. Family History:  Family History  Problem Relation Age of Onset   Mental illness Brother    Drug abuse Brother    Mental illness Cousin    Suicidality Cousin    Diabetes Maternal Grandmother    Family Psychiatric  History: There is report of significant history of mental illness with suicide and some close members of the family. Tobacco Screening:   Social History:  Social History   Substance and Sexual Activity  Alcohol Use Yes   Alcohol/week: 2.0 standard drinks   Types: 2 Cans of beer per week     Social History   Substance and Sexual Activity  Drug Use Yes   Types: Marijuana, MDMA Chiropodist)    Additional Social History: Marital status: Single Does patient have children?: No                         Allergies:   Allergies  Allergen Reactions   Haldol [Haloperidol Lactate] Other (See Comments)    Muscle stiffness   Penicillins Hives    Felt like throat was closing Has patient had a PCN reaction causing immediate rash, facial/tongue/throat swelling, SOB or lightheadedness with hypotension: Unknown Has patient had a PCN reaction causing severe rash involving mucus membranes or skin necrosis:  unknown Has patient had a PCN reaction that required hospitalization Unknown Has patient had a PCN reaction occurring within the last 10 years: Unknown If all of the above answers are "NO", then may proceed with Cephalosporin use.    Zyprexa [Olanzapine]     eps   Lab Results: No results found for this or any previous visit (from the past 48 hour(s)).  Blood Alcohol level:  Lab Results  Component Value Date   ETH <10 06/14/2021   ETH <10 04/20/2021    Metabolic Disorder Labs:  Lab Results  Component Value Date   HGBA1C 5.6 10/02/2020   MPG 114.02 10/02/2020   MPG 108 08/21/2020   Lab Results  Component Value Date   PROLACTIN 33.2 (H) 08/15/2016   Lab Results  Component Value Date   CHOL 147 10/02/2020   TRIG 39 10/02/2020   HDL 43 10/02/2020   CHOLHDL 3.4 10/02/2020   VLDL 8 10/02/2020   LDLCALC 96 10/02/2020   LDLCALC 107 (  H) 08/21/2020    Current Medications: Current Facility-Administered Medications  Medication Dose Route Frequency Provider Last Rate Last Admin   acetaminophen (TYLENOL) tablet 650 mg  650 mg Oral Q6H PRN Vanetta Mulders, NP   650 mg at 06/16/21 0820   alum & mag hydroxide-simeth (MAALOX/MYLANTA) 200-200-20 MG/5ML suspension 30 mL  30 mL Oral Q4H PRN Gabriel Cirri F, NP       divalproex (DEPAKOTE) DR tablet 500 mg  500 mg Oral Q12H Gabriel Cirri F, NP   500 mg at 06/16/21 0623   FLUoxetine (PROZAC) capsule 20 mg  20 mg Oral Daily Gabriel Cirri F, NP   20 mg at 06/16/21 7628   magnesium hydroxide (MILK OF MAGNESIA) suspension 30 mL  30 mL Oral Daily PRN Gabriel Cirri F, NP       nicotine (NICODERM CQ - dosed in mg/24 hours) patch 14 mg  14 mg Transdermal Daily Salahuddin Arismendez, Jackquline Denmark, MD   14 mg at 06/16/21 0931   QUEtiapine (SEROQUEL) tablet 100 mg  100 mg Oral QHS Ulrich Soules, Jackquline Denmark, MD       QUEtiapine (SEROQUEL) tablet 50 mg  50 mg Oral BID Gabriel Cirri F, NP   50 mg at 06/16/21 1630   PTA Medications: Medications Prior to Admission   Medication Sig Dispense Refill Last Dose   divalproex (DEPAKOTE) 500 MG DR tablet Take 1 tablet (500 mg total) by mouth every 12 (twelve) hours. 60 tablet 1    FLUoxetine (PROZAC) 40 MG capsule Take 1 capsule (40 mg total) by mouth daily. 30 capsule 1    prazosin (MINIPRESS) 1 MG capsule Take 1 capsule (1 mg total) by mouth at bedtime. 30 capsule 1    QUEtiapine (SEROQUEL) 400 MG tablet Take 1 tablet (400 mg total) by mouth at bedtime. 30 tablet 1    QUEtiapine (SEROQUEL) 50 MG tablet Take 1 tablet (50 mg total) by mouth 2 (two) times daily. 60 tablet 1    traZODone (DESYREL) 50 MG tablet TAKE 1 TABLET BY MOUTH AT BEDTIME AS NEEDED FOR SLEEP. 7 tablet 0    traZODone (DESYREL) 50 MG tablet Take 1 tablet (50 mg total) by mouth at bedtime as needed for sleep. 30 tablet 1     Musculoskeletal: Strength & Muscle Tone: within normal limits Gait & Station: normal Patient leans: N/A            Psychiatric Specialty Exam:  Presentation  General Appearance: Appropriate for Environment  Eye Contact:Fair  Speech:Normal Rate  Speech Volume:Normal  Handedness:Right   Mood and Affect  Mood:Euthymic  Affect:Appropriate   Thought Process  Thought Processes:Coherent  Duration of Psychotic Symptoms: Greater than six months  Past Diagnosis of Schizophrenia or Psychoactive disorder: Yes  Descriptions of Associations:Intact  Orientation:Full (Time, Place and Person)  Thought Content:Logical  Hallucinations:No data recorded Ideas of Reference:None  Suicidal Thoughts:No data recorded Homicidal Thoughts:No data recorded  Sensorium  Memory:Immediate Good; Recent Good; Remote Good  Judgment:Fair  Insight:Fair   Executive Functions  Concentration:Good  Attention Span:Good  Recall:Fair  Fund of Knowledge:Fair  Language:Good   Psychomotor Activity  Psychomotor Activity:No data recorded  Assets  Assets:Communication Skills; Desire for Improvement; Financial  Resources/Insurance; Resilience; Social Support   Sleep  Sleep:No data recorded   Physical Exam: Physical Exam Vitals and nursing note reviewed.  Constitutional:      Appearance: Normal appearance.  HENT:     Head: Normocephalic and atraumatic.     Mouth/Throat:     Pharynx:  Oropharynx is clear.  Eyes:     Pupils: Pupils are equal, round, and reactive to light.  Cardiovascular:     Rate and Rhythm: Normal rate and regular rhythm.  Pulmonary:     Effort: Pulmonary effort is normal.     Breath sounds: Normal breath sounds.  Abdominal:     General: Abdomen is flat.     Palpations: Abdomen is soft.  Musculoskeletal:        General: Normal range of motion.  Skin:    General: Skin is warm and dry.  Neurological:     General: No focal deficit present.     Mental Status: He is alert. Mental status is at baseline.  Psychiatric:        Attention and Perception: He is inattentive.        Mood and Affect: Mood is depressed. Affect is blunt.        Speech: Speech is delayed.        Behavior: Behavior is slowed.        Thought Content: Thought content is paranoid.        Cognition and Memory: Cognition is impaired.        Judgment: Judgment is inappropriate.   Review of Systems  Constitutional: Negative.   HENT: Negative.    Eyes: Negative.   Respiratory: Negative.    Cardiovascular: Negative.   Gastrointestinal: Negative.   Musculoskeletal: Negative.   Skin: Negative.   Neurological: Negative.   Psychiatric/Behavioral:  Positive for depression and hallucinations. The patient is nervous/anxious.   Blood pressure 108/64, pulse 78, temperature 98.6 F (37 C), temperature source Oral, resp. rate 18, height  (1.905 m), weight 93.4 kg, SpO2 99 %. Body mass index is 25.75 kg/m.  Treatment Plan Summary: Daily contact with patient to assess and evaluate symptoms and progress in treatment, Medication management, and Plan 25 year old man who physically appears to be in  reasonably good health.  He spent the entire day today in bed but was easily awoken for conversation.  Poor historian with no eye contact and little information.  Continues to express hopelessness and depression.  Also shows evidence of paranoia and disordered thinking.  Patient was not able to contribute to his treatment plan saying he thought everything was hopeless and had no idea how he could be helped.  I suggested we try restarting him on antidepressants and antipsychotics restarting Seroquel for now including 100 mg at night which we will try gradually titrating up to a higher dose and continuing Prozac.  Daily inclusion in individual and group therapy.  Treatment team tomorrow.  Work with local agencies to make sure he has a better chance of outpatient follow-up.  Observation Level/Precautions:  15 minute checks  Laboratory:  HbAIC  Psychotherapy:    Medications:    Consultations:    Discharge Concerns:    Estimated LOS:  Other:     Physician Treatment Plan for Primary Diagnosis: Schizoaffective disorder, bipolar type (HCC) Long Term Goal(s): Improvement in symptoms so as ready for discharge  Short Term Goals: Ability to verbalize feelings will improve and Ability to identify and develop effective coping behaviors will improve  Physician Treatment Plan for Secondary Diagnosis: Principal Problem:   Schizoaffective disorder, bipolar type (HCC) Active Problems:   Homelessness  Long Term Goal(s): Improvement in symptoms so as ready for discharge  Short Term Goals: Ability to maintain clinical measurements within normal limits will improve and Compliance with prescribed medications will improve  I certify that  inpatient services furnished can reasonably be expected to improve the patient's condition.    Mordecai Rasmussen, MD 12/13/20225:02 PM

## 2021-06-16 NOTE — [facility] Suicide Risk Assessment (Signed)
[facility] INPATIENT:  Family/Significant Other Suicide Prevention Education  Suicide Prevention Education:  Contact Attempts: Scarlette Slice, mother, [phone] has been identified by the patient as the family member/significant other with whom the patient will be residing, and identified as the person(s) who will aid the patient in the event of a mental health crisis.  With written consent from the patient, two attempts were made to provide suicide prevention education, prior to and/or following the patient's discharge.  We were unsuccessful in providing suicide prevention education.  A suicide education pamphlet was given to the patient to share with family/significant other.  Date and time of first attempt:06/16/2021 at 3:25 PM Date and time of second attempt:Second attempt is needed.   CSW left HIPAA compliant voicemail.  Harden Mo 06/16/2021, 3:25 PM

## 2021-06-16 NOTE — Progress Notes (Signed)
Recreation Therapy Notes  INPATIENT RECREATION TR PLAN  Patient Details Name: Timothy [name] MRN: [id number] DOB: April 08, 1996 Today's Date: 06/16/2021  Rec Therapy Plan Is patient appropriate for Therapeutic Recreation?: Yes Treatment times per week: at least 3 Estimated Length of Stay: 5-7 days TR Treatment/Interventions: Group participation (Comment)  Discharge Criteria Pt will be discharged from therapy if:: Discharged Treatment plan/goals/alternatives discussed and agreed upon by:: Patient/family  Discharge Summary     [name] 06/16/2021, 12:20 PM
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
## 2021-06-16 NOTE — Progress Notes (Signed)
D: Pt alert and oriented. Pt rates depression 10/10, hopelessness 10/10, and anxiety 10/10.  Pt reports energy level as low and concentration as being good. Pt reports sleep last night as being good. Pt did not receive medications for sleep. Pt reports experiencing 8/10 lower back pain at this time, prn med given. Pt denies experiencing any SI/HI, or AVH at this time.   Pt has been calm and cooperative.  A: Scheduled medications administered to pt, per MD orders. Support and encouragement provided. Frequent verbal contact made. Routine safety checks conducted q15 minutes.   R: No adverse drug reactions noted. Pt verbally contracts for safety at this time. Pt complaint with medications. Pt interacts minimally with others on the unit. Pt remains safe at this time. Will continue to monitor.

## 2021-06-16 NOTE — Progress Notes (Signed)
Recreation Therapy Notes   Date: 06/16/2021  Time: 10:15 am   Location:  Craft room    Behavioral response: N/A   Intervention Topic: Self-care      Discussion/Intervention: Patient did not attend group.  Clinical Observations/Feedback:  Patient did not attend group.   Sandford Diop LRT/CTRS        Mehki Klumpp 06/16/2021 12:01 PM

## 2021-06-17 DIAGNOSIS — F250 Schizoaffective disorder, bipolar type: Secondary | ICD-10-CM | POA: Diagnosis not present

## 2021-06-17 LAB — LIPID PANEL
Cholesterol: 169 mg/dL (ref 0–200)
HDL: 45 mg/dL (ref >40)
LDL Cholesterol: 111 mg/dL — ABNORMAL HIGH (ref 0–99)
Total CHOL/HDL Ratio: 3.8 RATIO
Triglycerides: 65 mg/dL (ref <150)
VLDL: 13 mg/dL (ref 0–40)

## 2021-06-17 NOTE — Progress Notes (Signed)
Recreation Therapy Notes   Date: 06/17/2021  Time: 10:15am   Location:  Craft room    Behavioral response: N/A   Intervention Topic: Values   Discussion/Intervention: Patient did not attend group.  Clinical Observations/Feedback:  Patient did not attend group.   Jonel Sick LRT/CTRS          Mikolaj Woolstenhulme 06/17/2021 11:13 AM

## 2021-06-17 NOTE — Progress Notes (Signed)
[facility] MD Progress Note  06/17/2021 11:23 AM Timothy [name]  MRN:  [id number] Subjective: Follow-up for this patient with schizoaffective disorder.  Patient seen and he attended treatment team.  Patient is very blunted and not very forthcoming or communicating well.  Denies having any concerns.  Will be drawn into any conversation about how we can help him or what his plans are for the future.  Mostly stays to himself in his room.  Denies suicidal intent.  Cooperative with medicine.  No physical complaints.  Lipid panel unremarkable. Principal Problem: Schizoaffective disorder, bipolar type (HCC) Diagnosis: Principal Problem:   Schizoaffective disorder, bipolar type (HCC) Active Problems:   Homelessness  Total Time spent with patient: 30 minutes  Past Psychiatric History: Past history of schizoaffective disorder with psychotic disorder and disability going back into adolescence  Past Medical History:  Past Medical History:  Diagnosis Date   Cannabis abuse    Paranoid schizophrenia (HCC)    Schizoaffective disorder, depressive type (HCC) 09/17/2014   History reviewed. No pertinent surgical history. Family History:  Family History  Problem Relation Age of Onset   Mental illness Brother    Drug abuse Brother    Mental illness Cousin    Suicidality Cousin    Diabetes Maternal Grandmother    Family Psychiatric  History: See previous Social History:  Social History   Substance and Sexual Activity  Alcohol Use Yes   Alcohol/week: 2.0 standard drinks   Types: 2 Cans of beer per week     Social History   Substance and Sexual Activity  Drug Use Yes   Types: Marijuana, MDMA Chiropodist)    Social History   Socioeconomic History   Marital status: Single    Spouse name: Not on file   Number of children: Not on file   Years of education: Not on file   Highest education level: Not on file  Occupational History   Not on file  Tobacco Use   Smoking status: Some Days    Packs/day:  0.25    Types: Cigarettes   Smokeless tobacco: Never  Vaping Use   Vaping Use: Never used  Substance and Sexual Activity   Alcohol use: Yes    Alcohol/week: 2.0 standard drinks    Types: 2 Cans of beer per week   Drug use: Yes    Types: Marijuana, MDMA (Ecstacy)   Sexual activity: Yes  Other Topics Concern   Not on file  Social History Narrative   Not on file   Social Determinants of Health   Financial Resource Strain: Not on file  Food Insecurity: Not on file  Transportation Needs: Not on file  Physical Activity: Not on file  Stress: Not on file  Social Connections: Not on file   Additional Social History:                         Sleep: Good  Appetite:  Fair  Current Medications: Current Facility-Administered Medications  Medication Dose Route Frequency Provider Last Rate Last Admin   acetaminophen (TYLENOL) tablet 650 mg  650 mg Oral Q6H PRN Gabriel Cirri F, NP   650 mg at 06/16/21 0820   alum & mag hydroxide-simeth (MAALOX/MYLANTA) 200-200-20 MG/5ML suspension 30 mL  30 mL Oral Q4H PRN Gabriel Cirri F, NP       divalproex (DEPAKOTE) DR tablet 500 mg  500 mg Oral Q12H Gabriel Cirri F, NP   500 mg at 06/17/21 0740   FLUoxetine (  PROZAC) capsule 20 mg  20 mg Oral Daily Gabriel Cirri F, NP   20 mg at 06/17/21 0740   magnesium hydroxide (MILK OF MAGNESIA) suspension 30 mL  30 mL Oral Daily PRN Gabriel Cirri F, NP       nicotine (NICODERM CQ - dosed in mg/24 hours) patch 14 mg  14 mg Transdermal Daily Audelia Knape, Jackquline Denmark, MD   14 mg at 06/17/21 0740   QUEtiapine (SEROQUEL) tablet 100 mg  100 mg Oral QHS Jarmal Lewelling T, MD   100 mg at 06/16/21 2059   QUEtiapine (SEROQUEL) tablet 50 mg  50 mg Oral BID Vanetta Mulders, NP   50 mg at 06/17/21 0740    Lab Results:  Results for orders placed or performed during the hospital encounter of 06/15/21 (from the past 48 hour(s))  Lipid panel     Status: Abnormal   Collection Time: 06/17/21  7:39 AM  Result  Value Ref Range   Cholesterol 169 0 - 200 mg/dL   Triglycerides 65 <308 mg/dL   HDL 45 >65 mg/dL   Total CHOL/HDL Ratio 3.8 RATIO   VLDL 13 0 - 40 mg/dL   LDL Cholesterol 784 (H) 0 - 99 mg/dL    Comment:        Total Cholesterol/HDL:CHD Risk Coronary Heart Disease Risk Table                     Men   Women  1/2 Average Risk   3.4   3.3  Average Risk       5.0   4.4  2 X Average Risk   9.6   7.1  3 X Average Risk  23.4   11.0        Use the calculated Patient Ratio above and the CHD Risk Table to determine the patient's CHD Risk.        ATP III CLASSIFICATION (LDL):  <100     mg/dL   Optimal  696-295  mg/dL   Near or Above                    Optimal  130-159  mg/dL   Borderline  284-132  mg/dL   High  >440     mg/dL   Very High Performed at [facility], [address] [place], [place] [address]     Blood Alcohol level:  Lab Results  Component Value Date   [facility] <10 06/14/2021   ETH <10 04/20/2021    Metabolic Disorder Labs: Lab Results  Component Value Date   HGBA1C 5.6 10/02/2020   MPG 114.02 10/02/2020   MPG 108 08/21/2020   Lab Results  Component Value Date   PROLACTIN 33.2 (H) 08/15/2016   Lab Results  Component Value Date   CHOL 169 06/17/2021   TRIG 65 06/17/2021   HDL 45 06/17/2021   CHOLHDL 3.8 06/17/2021   VLDL 13 06/17/2021   LDLCALC 111 (H) 06/17/2021   LDLCALC 96 10/02/2020    Physical Findings: AIMS:  , ,  ,  ,    CIWA:    COWS:     Musculoskeletal: Strength & Muscle Tone: within normal limits Gait & Station: normal Patient leans: N/A  Psychiatric Specialty Exam:  Presentation  General Appearance: Appropriate for Environment  Eye Contact:Fair  Speech:Normal Rate  Speech Volume:Normal  Handedness:Right   Mood and Affect  Mood:Euthymic  Affect:Appropriate   Thought Process  Thought Processes:Coherent  Descriptions of Associations:Intact  Orientation:Full (Time, Place and Person)  Thought  Content:Logical  History of Schizophrenia/Schizoaffective disorder:Yes  Duration of Psychotic Symptoms:Greater than six months  Hallucinations:No data recorded Ideas of Reference:None  Suicidal Thoughts:No data recorded Homicidal Thoughts:No data recorded  Sensorium  Memory:Immediate Good; Recent Good; Remote Good  Judgment:Fair  Insight:Fair   Executive Functions  Concentration:Good  Attention Span:Good  Recall:Fair  Fund of Knowledge:Fair  Language:Good   Psychomotor Activity  Psychomotor Activity:No data recorded  Assets  Assets:Communication Skills; Desire for Improvement; Financial Resources/Insurance; Resilience; Social Support   Sleep  Sleep:No data recorded   Physical Exam: Physical Exam Vitals and nursing note reviewed.  Constitutional:      Appearance: Normal appearance.  HENT:     Head: Normocephalic and atraumatic.     Mouth/Throat:     Pharynx: Oropharynx is clear.  Eyes:     Pupils: Pupils are equal, round, and reactive to light.  Cardiovascular:     Rate and Rhythm: Normal rate and regular rhythm.  Pulmonary:     Effort: Pulmonary effort is normal.     Breath sounds: Normal breath sounds.  Abdominal:     General: Abdomen is flat.     Palpations: Abdomen is soft.  Musculoskeletal:        General: Normal range of motion.  Skin:    General: Skin is warm and dry.  Neurological:     General: No focal deficit present.     Mental Status: He is alert. Mental status is at baseline.  Psychiatric:        Attention and Perception: He is inattentive.        Mood and Affect: Mood normal. Affect is blunt.        Speech: He is noncommunicative.        Behavior: Behavior is withdrawn.        Cognition and Memory: Cognition is impaired.        Judgment: Judgment is inappropriate.   Review of Systems  Constitutional: Negative.   HENT: Negative.    Eyes: Negative.   Respiratory: Negative.    Cardiovascular: Negative.   Gastrointestinal:  Negative.   Musculoskeletal: Negative.   Skin: Negative.   Neurological: Negative.   Psychiatric/Behavioral: Negative.    Blood pressure 102/69, pulse 72, temperature 98.9 F (37.2 C), temperature source Oral, resp. rate 18, height 6\' 3"  (1.905 m), weight 93.4 kg, SpO2 97 %. Body mass index is 25.75 kg/m.   Treatment Plan Summary: Medication management and Plan no change to medication today.  Tried several times to form some rapport or drawing in the conversation without much success.  Appears to be very depressed and negativistic and probably having internal stimuli.  Continue antipsychotic and antidepressant medication while daily assessing him and encouraging group attendance.  Once we have a more interactive we can work on possible planning for discharge  , MD 06/17/2021, 11:23 AM

## 2021-06-17 NOTE — BH IP Treatment Plan (Signed)
Interdisciplinary Treatment and Diagnostic Plan Update  06/17/2021 Time of Session: 9;30AM Timothy [name] MRN: [id number]  Principal Diagnosis: Schizoaffective disorder, bipolar type [facility])  Secondary Diagnoses: Principal Problem:   Schizoaffective disorder, bipolar type (HCC) Active Problems:   Homelessness   Current Medications:  Current Facility-Administered Medications  Medication Dose Route Frequency Provider Last Rate Last Admin   acetaminophen (TYLENOL) tablet 650 mg  650 mg Oral Q6H PRN Vanetta Mulders, NP   650 mg at 06/16/21 0820   alum & mag hydroxide-simeth (MAALOX/MYLANTA) 200-200-20 MG/5ML suspension 30 mL  30 mL Oral Q4H PRN Gabriel Cirri F, NP       divalproex (DEPAKOTE) DR tablet 500 mg  500 mg Oral Q12H Gabriel Cirri F, NP   500 mg at 06/17/21 0740   FLUoxetine (PROZAC) capsule 20 mg  20 mg Oral Daily Gabriel Cirri F, NP   20 mg at 06/17/21 0740   magnesium hydroxide (MILK OF MAGNESIA) suspension 30 mL  30 mL Oral Daily PRN Gabriel Cirri F, NP       nicotine (NICODERM CQ - dosed in mg/24 hours) patch 14 mg  14 mg Transdermal Daily Clapacs, Jackquline Denmark, MD   14 mg at 06/17/21 0740   QUEtiapine (SEROQUEL) tablet 100 mg  100 mg Oral QHS Clapacs, John T, MD   100 mg at 06/16/21 2059   QUEtiapine (SEROQUEL) tablet 50 mg  50 mg Oral BID Vanetta Mulders, NP   50 mg at 06/17/21 0740   PTA Medications: Medications Prior to Admission  Medication Sig Dispense Refill Last Dose   divalproex (DEPAKOTE) 500 MG DR tablet Take 1 tablet (500 mg total) by mouth every 12 (twelve) hours. 60 tablet 1    FLUoxetine (PROZAC) 40 MG capsule Take 1 capsule (40 mg total) by mouth daily. 30 capsule 1    prazosin (MINIPRESS) 1 MG capsule Take 1 capsule (1 mg total) by mouth at bedtime. 30 capsule 1    QUEtiapine (SEROQUEL) 400 MG tablet Take 1 tablet (400 mg total) by mouth at bedtime. 30 tablet 1    QUEtiapine (SEROQUEL) 50 MG tablet Take 1 tablet (50 mg total) by mouth 2 (two)  times daily. 60 tablet 1    traZODone (DESYREL) 50 MG tablet TAKE 1 TABLET BY MOUTH AT BEDTIME AS NEEDED FOR SLEEP. 7 tablet 0    traZODone (DESYREL) 50 MG tablet Take 1 tablet (50 mg total) by mouth at bedtime as needed for sleep. 30 tablet 1     Patient Stressors: Financial difficulties   Medication change or noncompliance   Substance abuse   Other: Accomodation    Patient Strengths: Ability for insight  Capable of independent living  Communication skills  General fund of knowledge   Treatment Modalities: Medication Management, Group therapy, Case management,  1 to 1 session with clinician, Psychoeducation, Recreational therapy.   Physician Treatment Plan for Primary Diagnosis: Schizoaffective disorder, bipolar type (HCC) Long Term Goal(s): Improvement in symptoms so as ready for discharge   Short Term Goals: Ability to maintain clinical measurements within normal limits will improve Compliance with prescribed medications will improve Ability to verbalize feelings will improve Ability to identify and develop effective coping behaviors will improve  Medication Management: Evaluate patient's response, side effects, and tolerance of medication regimen.  Therapeutic Interventions: 1 to 1 sessions, Unit Group sessions and Medication administration.  Evaluation of Outcomes: Not Progressing  Physician Treatment Plan for Secondary Diagnosis: Principal Problem:   Schizoaffective disorder, bipolar type (HCC) Active  Problems:   Homelessness  Long Term Goal(s): Improvement in symptoms so as ready for discharge   Short Term Goals: Ability to maintain clinical measurements within normal limits will improve Compliance with prescribed medications will improve Ability to verbalize feelings will improve Ability to identify and develop effective coping behaviors will improve     Medication Management: Evaluate patient's response, side effects, and tolerance of medication  regimen.  Therapeutic Interventions: 1 to 1 sessions, Unit Group sessions and Medication administration.  Evaluation of Outcomes: Not Progressing   RN Treatment Plan for Primary Diagnosis: Schizoaffective disorder, bipolar type (HCC) Long Term Goal(s): Knowledge of disease and therapeutic regimen to maintain health will improve  Short Term Goals: Ability to demonstrate self-control, Ability to participate in decision making will improve, Ability to verbalize feelings will improve, Ability to disclose and discuss suicidal ideas, Ability to identify and develop effective coping behaviors will improve, and Compliance with prescribed medications will improve  Medication Management: RN will administer medications as ordered by provider, will assess and evaluate patient's response and provide education to patient for prescribed medication. RN will report any adverse and/or side effects to prescribing provider.  Therapeutic Interventions: 1 on 1 counseling sessions, Psychoeducation, Medication administration, Evaluate responses to treatment, Monitor vital signs and CBGs as ordered, Perform/monitor CIWA, COWS, AIMS and Fall Risk screenings as ordered, Perform wound care treatments as ordered.  Evaluation of Outcomes: Not Progressing   LCSW Treatment Plan for Primary Diagnosis: Schizoaffective disorder, bipolar type (HCC) Long Term Goal(s): Safe transition to appropriate next level of care at discharge, Engage patient in therapeutic group addressing interpersonal concerns.  Short Term Goals: Engage patient in aftercare planning with referrals and resources, Increase social support, Increase ability to appropriately verbalize feelings, Increase emotional regulation, Facilitate acceptance of mental health diagnosis and concerns, and Increase skills for wellness and recovery  Therapeutic Interventions: Assess for all discharge needs, 1 to 1 time with Social worker, Explore available resources and support  systems, Assess for adequacy in community support network, Educate family and significant other(s) on suicide prevention, Complete Psychosocial Assessment, Interpersonal group therapy.  Evaluation of Outcomes: Not Progressing   Progress in Treatment: Attending groups: No. Participating in groups: No. Taking medication as prescribed: Yes. Toleration medication: Yes. Family/Significant other contact made: Yes, individual(s) contacted:  SPE attempts have been made for the patients mother.  Patient understands diagnosis: Yes. Discussing patient identified problems/goals with staff: Yes. Medical problems stabilized or resolved: Yes. Denies suicidal/homicidal ideation: Yes. Issues/concerns per patient self-inventory: No. Other: none  New problem(s) identified: No, Describe:  none  New Short Term/Long Term Goal(s): medication management for mood stabilization; elimination of SI thoughts; development of comprehensive mental wellness/sobriety plan.   Patient Goals:  "I don't have any goals"  Discharge Plan or Barriers: CSW will assist patient in developing appropriate discharge plans.  Patient reports that he will follow up with [facility] for aftercare.  Patient thinking about his living situation.   Reason for Continuation of Hospitalization: Anxiety Depression Medication stabilization  Estimated Length of Stay:  1-7 days   Scribe for Treatment Team: Harden Mo, LCSW 06/17/2021 10:00 AM

## 2021-06-17 NOTE — Group Note (Signed)
[facility] LCSW Group Therapy Note   Group Date: 06/17/2021 Start Time: 1300 End Time: 1400   Type of Therapy/Topic:  Group Therapy:  Emotion Regulation  Participation Level:  Active   Mood:  Description of Group:    The purpose of this group is to assist patients in learning to regulate negative emotions and experience positive emotions. Patients will be guided to discuss ways in which they have been vulnerable to their negative emotions. These vulnerabilities will be juxtaposed with experiences of positive emotions or situations, and patients challenged to use positive emotions to combat negative ones. Special emphasis will be placed on coping with negative emotions in conflict situations, and patients will process healthy conflict resolution skills.  Therapeutic Goals: Patient will identify two positive emotions or experiences to reflect on in order to balance out negative emotions:  Patient will label two or more emotions that they find the most difficult to experience:  Patient will be able to demonstrate positive conflict resolution skills through discussion or role plays:   Summary of Patient Progress:   Patient was present in group.  Patient was an active participant. Patient shared that when he thinks of "emotion regulation" he thinks of "controlling my emotions".  He reports that that prior to this hospitalization he was feeling "anxiety".  He reports that his trigger was "not having support".  He reports that it is important to understand your emotions because "feel in control".  He reports that he uses pets and talking to someone one on one are his coping mechanisms.   Therapeutic Modalities:   Cognitive Behavioral Therapy Feelings Identification Dialectical Behavioral Therapy   Harden Mo, LCSW

## 2021-06-17 NOTE — Progress Notes (Signed)
Patient calm and cooperative during assessment denying SI/HI/AVH. Pt endorses depression. Pt presents with a flat affect. Pt observed interacting appropriately with staff and peers on the unit. Pt compliant with medication administration per MD orders. Pt given education, support, and encouragement to be active in his treatment plan. Pt being monitored Q 15 minutes for safety per unit protocol. Pt remains safe on the unit.  

## 2021-06-17 NOTE — Plan of Care (Signed)
°  Problem: Education: Goal: Emotional status will improve Outcome: Not Progressing Goal: Mental status will improve Outcome: Not Progressing   Problem: Activity: Goal: Interest or engagement in activities will improve Outcome: Not Progressing   Problem: Safety: Goal: Periods of time without injury will increase Outcome: Progressing   Problem: Education: Goal: Utilization of techniques to improve thought processes will improve Outcome: Not Progressing

## 2021-06-17 NOTE — Progress Notes (Signed)
Patient in bed most of shift, sleeping. Wakes up for medication and assessment. No interaction with peers, minimal with staff. Denies SI, HI, AVH. Endorses depression. No complaints voiced. Slept well in no distress. Encouragement and support offered. Safety checks maintained. Pt receptive and remains safe on unit with q 15 min checks.

## 2021-06-17 NOTE — Progress Notes (Signed)
D: Pt alert and oriented. Pt rates depression 10/10, hopelessness 10/10, and anxiety 10/10. Pt reports energy level as low and concentration as being poor. Pt reports sleep last night as being good. Pt did not receive medications for sleep. Pt denies experiencing any pain at this time. Pt denies experiencing any SI/HI, or AVH at this time.   Pt shares during treatment team that he does not having any goals. Pt states he doesn't think we (hospital) can help him. Pt is quiet, calm, and cooperative.  A: Scheduled medications administered to pt, per MD orders. Support and encouragement provided. Frequent verbal contact made. Routine safety checks conducted q15 minutes.   R: No adverse drug reactions noted. Pt verbally contracts for safety at this time. Pt complaint with medications. Pt interacts minimally with others on the unit, mostly self isolating to room with exception of meal. Pt remains safe at this time. Will continue to monitor.

## 2021-06-18 LAB — HEMOGLOBIN A1C
Hgb A1c MFr Bld: 5.6 % (ref 4.8–5.6)
Mean Plasma Glucose: 114 mg/dL

## 2021-06-18 NOTE — Plan of Care (Signed)
°  Problem: Education: Goal: Knowledge of [place] [facility] Education information/materials will improve Outcome: Progressing Goal: Emotional status will improve Outcome: Progressing Goal: Mental status will improve Outcome: Progressing Goal: Verbalization of understanding the information provided will improve Outcome: Progressing   Problem: Activity: Goal: Interest or engagement in activities will improve Outcome: Progressing Goal: Sleeping patterns will improve Outcome: Progressing   Problem: Coping: Goal: Ability to verbalize frustrations and anger appropriately will improve Outcome: Progressing Goal: Ability to demonstrate self-control will improve Outcome: Progressing   Problem: Health Behavior/Discharge Planning: Goal: Identification of resources available to assist in meeting health care needs will improve Outcome: Progressing Goal: Compliance with treatment plan for underlying cause of condition will improve Outcome: Progressing   Problem: Physical Regulation: Goal: Ability to maintain clinical measurements within normal limits will improve Outcome: Progressing   Problem: Safety: Goal: Periods of time without injury will increase Outcome: Progressing   Problem: Education: Goal: Utilization of techniques to improve thought processes will improve Outcome: Progressing Goal: Knowledge of the prescribed therapeutic regimen will improve Outcome: Progressing   Problem: Activity: Goal: Interest or engagement in leisure activities will improve Outcome: Progressing Goal: Imbalance in normal sleep/wake cycle will improve Outcome: Progressing   Problem: Role Relationship: Goal: Will demonstrate positive changes in social behaviors and relationships Outcome: Progressing

## 2021-06-18 NOTE — Progress Notes (Signed)
Patient calm and cooperative during assessment denying SI/HI/AVH. Pt endorses depression. Pt presents with a flat affect. Pt isolated to his room tonight. Pt compliant with medication administration per MD orders. Pt given education, support, and encouragement to be active in his treatment plan. Pt being monitored Q 15 minutes for safety per unit protocol. Pt remains safe on the unit.

## 2021-06-18 NOTE — [facility] Suicide Risk Assessment (Signed)
[facility] INPATIENT:  Family/Significant Other Suicide Prevention Education  Suicide Prevention Education:  Family/Significant Other Refusal to Support Patient after Discharge:  Suicide Prevention Education Not Provided:  Patient has identified home of family/significant other as the place the patient will be residing after discharge.  With written consent of the patient, two attempts were made to provide Suicide Prevention Education to [place], mother, [phone].  This person indicates he/she will not be responsible for the patient after discharge.  Mother reports that she is "done" with the patient.  She reports that patient can NOT come to her home.  She reports that she is not willing to be a support for him at this time.  She declined to take this CSW contact information or any other CSW contact information as well.   [place] Floyed Masoud 06/18/2021,4:40 PM

## 2021-06-18 NOTE — Progress Notes (Signed)
[facility] MD Progress Note  06/18/2021 3:15 PM Timothy [name]  MRN:  [id number] Subjective: Follow-up with this 25 year old man with history of schizoaffective disorder with depression.  Patient has become more interactive.  He is particularly more interactive in groups and with other patients.  Still quiet when I talked to him but more forthcoming than before.  Able to make eye contact today.  Tells me that he now has a goal which is to get a job.  Cannot be much more clear than that.  Denies suicidal thoughts.  Does not seem to be as preoccupied by internal stimuli. Principal Problem: Schizoaffective disorder, bipolar type (HCC) Diagnosis: Principal Problem:   Schizoaffective disorder, bipolar type (HCC) Active Problems:   Homelessness  Total Time spent with patient: 30 minutes  Past Psychiatric History: Past history of schizoaffective disorder longstanding mental health issues  Past Medical History:  Past Medical History:  Diagnosis Date   Cannabis abuse    Paranoid schizophrenia (HCC)    Schizoaffective disorder, depressive type (HCC) 09/17/2014   History reviewed. No pertinent surgical history. Family History:  Family History  Problem Relation Age of Onset   Mental illness Brother    Drug abuse Brother    Mental illness Cousin    Suicidality Cousin    Diabetes Maternal Grandmother    Family Psychiatric  History: See previous Social History:  Social History   Substance and Sexual Activity  Alcohol Use Yes   Alcohol/week: 2.0 standard drinks   Types: 2 Cans of beer per week     Social History   Substance and Sexual Activity  Drug Use Yes   Types: Marijuana, MDMA Chiropodist)    Social History   Socioeconomic History   Marital status: Single    Spouse name: Not on file   Number of children: Not on file   Years of education: Not on file   Highest education level: Not on file  Occupational History   Not on file  Tobacco Use   Smoking status: Some Days    Packs/day:  0.25    Types: Cigarettes   Smokeless tobacco: Never  Vaping Use   Vaping Use: Never used  Substance and Sexual Activity   Alcohol use: Yes    Alcohol/week: 2.0 standard drinks    Types: 2 Cans of beer per week   Drug use: Yes    Types: Marijuana, MDMA (Ecstacy)   Sexual activity: Yes  Other Topics Concern   Not on file  Social History Narrative   Not on file   Social Determinants of Health   Financial Resource Strain: Not on file  Food Insecurity: Not on file  Transportation Needs: Not on file  Physical Activity: Not on file  Stress: Not on file  Social Connections: Not on file   Additional Social History:                         Sleep: Fair  Appetite:  Fair  Current Medications: Current Facility-Administered Medications  Medication Dose Route Frequency Provider Last Rate Last Admin   acetaminophen (TYLENOL) tablet 650 mg  650 mg Oral Q6H PRN Gabriel Cirri F, NP   650 mg at 06/18/21 1044   alum & mag hydroxide-simeth (MAALOX/MYLANTA) 200-200-20 MG/5ML suspension 30 mL  30 mL Oral Q4H PRN Gabriel Cirri F, NP       divalproex (DEPAKOTE) DR tablet 500 mg  500 mg Oral Q12H Barthold, Nickola Major, NP   500 mg  at 06/18/21 0854   FLUoxetine (PROZAC) capsule 20 mg  20 mg Oral Daily Gabriel Cirri F, NP   20 mg at 06/18/21 4098   magnesium hydroxide (MILK OF MAGNESIA) suspension 30 mL  30 mL Oral Daily PRN Vanetta Mulders, NP       nicotine (NICODERM CQ - dosed in mg/24 hours) patch 14 mg  14 mg Transdermal Daily Rosamae Rocque, Jackquline Denmark, MD   14 mg at 06/18/21 0855   QUEtiapine (SEROQUEL) tablet 100 mg  100 mg Oral QHS Mithran Strike T, MD   100 mg at 06/17/21 2112   QUEtiapine (SEROQUEL) tablet 50 mg  50 mg Oral BID Vanetta Mulders, NP   50 mg at 06/18/21 1191    Lab Results:  Results for orders placed or performed during the hospital encounter of 06/15/21 (from the past 48 hour(s))  Lipid panel     Status: Abnormal   Collection Time: 06/17/21  7:39 AM  Result  Value Ref Range   Cholesterol 169 0 - 200 mg/dL   Triglycerides 65 <478 mg/dL   HDL 45 >29 mg/dL   Total CHOL/HDL Ratio 3.8 RATIO   VLDL 13 0 - 40 mg/dL   LDL Cholesterol 562 (H) 0 - 99 mg/dL    Comment:        Total Cholesterol/HDL:CHD Risk Coronary Heart Disease Risk Table                     Men   Women  1/2 Average Risk   3.4   3.3  Average Risk       5.0   4.4  2 X Average Risk   9.6   7.1  3 X Average Risk  23.4   11.0        Use the calculated Patient Ratio above and the CHD Risk Table to determine the patient's CHD Risk.        ATP III CLASSIFICATION (LDL):  <100     mg/dL   Optimal  130-865  mg/dL   Near or Above                    Optimal  130-159  mg/dL   Borderline  784-696  mg/dL   High  >295     mg/dL   Very High Performed at [facility], [address] [place], [place] [address]   Hemoglobin A1c     Status: None   Collection Time: 06/17/21  7:39 AM  Result Value Ref Range   Hgb A1c MFr Bld 5.6 4.8 - 5.6 %    Comment: (NOTE)         Prediabetes: 5.7 - 6.4         Diabetes: >6.4         Glycemic control for adults with diabetes: <7.0    Mean Plasma Glucose 114 mg/dL    Comment: (NOTE) Performed At: [facility] [address] [place], [place] [id number] Jolene Schimke MD ZD:[id number]     Blood Alcohol level:  Lab Results  Component Value Date   [facility] <10 06/14/2021   ETH <10 04/20/2021    Metabolic Disorder Labs: Lab Results  Component Value Date   HGBA1C 5.6 06/17/2021   MPG 114 06/17/2021   MPG 114.02 10/02/2020   Lab Results  Component Value Date   PROLACTIN 33.2 (H) 08/15/2016   Lab Results  Component Value Date   CHOL 169 06/17/2021   TRIG 65  06/17/2021   HDL 45 06/17/2021   CHOLHDL 3.8 06/17/2021   VLDL 13 06/17/2021   LDLCALC 111 (H) 06/17/2021   LDLCALC 96 10/02/2020    Physical Findings: AIMS:  , ,  ,  ,    CIWA:    COWS:     Musculoskeletal: Strength & Muscle Tone: within normal limits Gait &  Station: normal Patient leans: N/A  Psychiatric Specialty Exam:  Presentation  General Appearance: Appropriate for Environment  Eye Contact:Fair  Speech:Normal Rate  Speech Volume:Normal  Handedness:Right   Mood and Affect  Mood:Euthymic  Affect:Appropriate   Thought Process  Thought Processes:Coherent  Descriptions of Associations:Intact  Orientation:Full (Time, Place and Person)  Thought Content:Logical  History of Schizophrenia/Schizoaffective disorder:Yes  Duration of Psychotic Symptoms:Greater than six months  Hallucinations:No data recorded Ideas of Reference:None  Suicidal Thoughts:No data recorded Homicidal Thoughts:No data recorded  Sensorium  Memory:Immediate Good; Recent Good; Remote Good  Judgment:Fair  Insight:Fair   Executive Functions  Concentration:Good  Attention Span:Good  Recall:Fair  Fund of Knowledge:Fair  Language:Good   Psychomotor Activity  Psychomotor Activity:No data recorded  Assets  Assets:Communication Skills; Desire for Improvement; Financial Resources/Insurance; Resilience; Social Support   Sleep  Sleep:No data recorded   Physical Exam: Physical Exam Vitals and nursing note reviewed.  Constitutional:      Appearance: Normal appearance.  HENT:     Head: Normocephalic and atraumatic.     Mouth/Throat:     Pharynx: Oropharynx is clear.  Eyes:     Pupils: Pupils are equal, round, and reactive to light.  Cardiovascular:     Rate and Rhythm: Normal rate and regular rhythm.  Pulmonary:     Effort: Pulmonary effort is normal.     Breath sounds: Normal breath sounds.  Abdominal:     General: Abdomen is flat.     Palpations: Abdomen is soft.  Musculoskeletal:        General: Normal range of motion.  Skin:    General: Skin is warm and dry.  Neurological:     General: No focal deficit present.     Mental Status: He is alert. Mental status is at baseline.  Psychiatric:        Attention and  Perception: Attention normal.        Mood and Affect: Mood normal. Affect is blunt.        Speech: Speech is delayed.        Behavior: Behavior is slowed.        Thought Content: Thought content normal.        Cognition and Memory: Cognition normal.   Review of Systems  Constitutional: Negative.   HENT: Negative.    Eyes: Negative.   Respiratory: Negative.    Cardiovascular: Negative.   Gastrointestinal: Negative.   Musculoskeletal: Negative.   Skin: Negative.   Neurological: Negative.   Psychiatric/Behavioral: Negative.    Blood pressure 114/67, pulse 78, temperature 98.5 F (36.9 C), temperature source Oral, resp. rate 17, height 6\' 3"  (1.905 m), weight 93.4 kg, SpO2 99 %. Body mass index is 25.75 kg/m.   Treatment Plan Summary: Medication management and Plan no change to medication today as he seems to be doing much better.  Encourage him with his group attendance.  Encouraged him to think more specifically about plans after discharge and engage with social work and the treatment team about that  , MD 06/18/2021, 3:15 PM

## 2021-06-18 NOTE — Group Note (Signed)
[facility] LCSW Group Therapy Note ° ° °Group Date: 06/18/2021 °Start Time: 1300 °End Time: 1400 ° ° °Type of Therapy/Topic:  Group Therapy:  Balance in Life ° °Participation Level:  Did Not Attend  ° °Description of Group:   ° This group will address the concept of balance and how it feels and looks when one is unbalanced. Patients will be encouraged to process areas in their lives that are out of balance, and identify reasons for remaining unbalanced. Facilitators will guide patients utilizing problem- solving interventions to address and correct the stressor making their life unbalanced. Understanding and applying boundaries will be explored and addressed for obtaining  and maintaining a balanced life. Patients will be encouraged to explore ways to assertively make their unbalanced needs known to significant others in their lives, using other group members and facilitator for support and feedback. ° °Therapeutic Goals: °Patient will identify two or more emotions or situations they have that consume much of in their lives. °Patient will identify signs/triggers that life has become out of balance:  °Patient will identify two ways to set boundaries in order to achieve balance in their lives:  °Patient will demonstrate ability to communicate their needs through discussion and/or role plays ° °Summary of Patient Progress: ° ° ° °Group not held due to the acuity on the unit.  ° ° ° °Therapeutic Modalities:   °Cognitive Behavioral Therapy °Solution-Focused Therapy °Assertiveness Training ° ° °Jamonte Curfman J Makaiya Geerdes, LCSW °

## 2021-06-18 NOTE — Progress Notes (Signed)
Patient calm and cooperative this shift. Denies SI/HI/AH/VH. Rates anxiety and depression 10/10. C/o back pain. Rates pain 8/10. See MAR for details.  Takes all medication as prescribed. No adverse affects noted. Supported emotionally and encouraged to verbalized needs.   Will cont to monitor for safety.

## 2021-06-18 NOTE — Progress Notes (Signed)
Recreation Therapy Notes   Date: 06/18/2021  Time: 10:40 am   Location: Craft room    Behavioral response: Appropriate  Intervention Topic: Stress Management    Discussion/Intervention:  Group content on today was focused on stress. The group defined stress and way to cope with stress. Participants expressed how they know when they are stresses out. Individuals described the different ways they have to cope with stress. The group stated reasons why it is important to cope with stress. Patient explained what good stress is and some examples. The group participated in the intervention Stress Management. Individuals were separated into two group and answered questions related to stress.   Clinical Observations/Feedback: Patient came to group late and was focused on what peers and staff had to say about stress management. He explained that when he is stressed his hands start to shake. Individual was social with peers and staff while participating in the intervention.   Criss Pallone LRT/CTRS         Consuella Scurlock 06/18/2021 12:13 PM

## 2021-06-18 NOTE — [facility] Suicide Risk Assessment (Signed)
[facility] INPATIENT:  Family/Significant Other Suicide Prevention Education  Suicide Prevention Education:  Contact Attempts: Scarlette Slice, mother, [phone] has been identified by the patient as the family member/significant other with whom the patient will be residing, and identified as the person(s) who will aid the patient in the event of a mental health crisis.  With written consent from the patient, two attempts were made to provide suicide prevention education, prior to and/or following the patient's discharge.  We were unsuccessful in providing suicide prevention education.  A suicide education pamphlet was given to the patient to share with family/significant other.  Date and time of first attempt:06/16/2021 at 3:25 PM Date and time of second attempt: 06/18/2021 at 4:24 PM  CSW left HIPAA compliant voicemail.   Harden Mo 06/18/2021, 4:24 PM

## 2021-06-19 MED ORDER — QUETIAPINE FUMARATE 200 MG PO TABS
200.0000 mg | ORAL_TABLET | Freq: Every day | ORAL | Status: DC
  Administered 2021-06-19 – 2021-06-25 (×7): 200 mg via ORAL
  Filled 2021-06-19 (×7): qty 1

## 2021-06-19 MED ORDER — LIDOCAINE 5 % EX PTCH
1.0000 | MEDICATED_PATCH | CUTANEOUS | Status: DC
  Administered 2021-06-19 – 2021-06-25 (×7): 1 via TRANSDERMAL
  Filled 2021-06-19 (×9): qty 1

## 2021-06-19 NOTE — Progress Notes (Signed)
Patient and alert this shift. Interacting appropriately on the milieu. Denies SI/HI/AVH. Rates anxiety and depression 8/10 and pain 7/10. See MAR for details.   Labs and vital signs monitored. Patient supported emotionally end encouraged to verbalize needs.   No adverse reactions to medications noted. Cont Q15 minute check for safety.

## 2021-06-19 NOTE — Plan of Care (Signed)
  Problem: Education: Goal: Knowledge of [place] [facility] Education information/materials will improve Outcome: Progressing Goal: Emotional status will improve Outcome: Progressing Goal: Mental status will improve Outcome: Progressing Goal: Verbalization of understanding the information provided will improve Outcome: Progressing   Problem: Activity: Goal: Interest or engagement in activities will improve Outcome: Progressing Goal: Sleeping patterns will improve Outcome: Progressing   Problem: Coping: Goal: Ability to verbalize frustrations and anger appropriately will improve Outcome: Progressing Goal: Ability to demonstrate self-control will improve Outcome: Progressing   Problem: Health Behavior/Discharge Planning: Goal: Identification of resources available to assist in meeting health care needs will improve Outcome: Progressing Goal: Compliance with treatment plan for underlying cause of condition will improve Outcome: Progressing   Problem: Physical Regulation: Goal: Ability to maintain clinical measurements within normal limits will improve Outcome: Progressing   Problem: Safety: Goal: Periods of time without injury will increase Outcome: Progressing   

## 2021-06-19 NOTE — Progress Notes (Signed)
Recreation Therapy Notes    Date: 06/19/2021  Time: 10:35 am     Location: Craft room    Behavioral response: N/A   Intervention Topic: Life Planning    Discussion/Intervention: Patient did not attend group.   Clinical Observations/Feedback:  Patient did not attend group.    Coren Sagan LRT/CTRS        Julyssa Kyer 06/19/2021 12:44 PM

## 2021-06-19 NOTE — Progress Notes (Signed)
[facility] MD Progress Note  06/19/2021 1:03 PM Timothy [name]  MRN:  [id number] Subjective: Follow-up 25 year old man with schizophrenia or schizoaffective disorder.  Today he is remaining in bed late.  Hard to engage.  Poor eye contact.  Quiet speech and answering questions only with a couple of words.  When he does get up still goes to groups and interacts reasonably well but has a hard time articulating any future plan.  Appears confused and paranoid.  Patient is complaining of back pain unresponsive to acetaminophen. Principal Problem: Schizoaffective disorder, bipolar type (HCC) Diagnosis: Principal Problem:   Schizoaffective disorder, bipolar type (HCC) Active Problems:   Homelessness  Total Time spent with patient: 30 minutes  Past Psychiatric History: Past history of schizoaffective disorder with problematic compliance and homelessness  Past Medical History:  Past Medical History:  Diagnosis Date   Cannabis abuse    Paranoid schizophrenia (HCC)    Schizoaffective disorder, depressive type (HCC) 09/17/2014   History reviewed. No pertinent surgical history. Family History:  Family History  Problem Relation Age of Onset   Mental illness Brother    Drug abuse Brother    Mental illness Cousin    Suicidality Cousin    Diabetes Maternal Grandmother    Family Psychiatric  History: See previous Social History:  Social History   Substance and Sexual Activity  Alcohol Use Yes   Alcohol/week: 2.0 standard drinks   Types: 2 Cans of beer per week     Social History   Substance and Sexual Activity  Drug Use Yes   Types: Marijuana, MDMA Chiropodist)    Social History   Socioeconomic History   Marital status: Single    Spouse name: Not on file   Number of children: Not on file   Years of education: Not on file   Highest education level: Not on file  Occupational History   Not on file  Tobacco Use   Smoking status: Some Days    Packs/day: 0.25    Types: Cigarettes   Smokeless  tobacco: Never  Vaping Use   Vaping Use: Never used  Substance and Sexual Activity   Alcohol use: Yes    Alcohol/week: 2.0 standard drinks    Types: 2 Cans of beer per week   Drug use: Yes    Types: Marijuana, MDMA (Ecstacy)   Sexual activity: Yes  Other Topics Concern   Not on file  Social History Narrative   Not on file   Social Determinants of Health   Financial Resource Strain: Not on file  Food Insecurity: Not on file  Transportation Needs: Not on file  Physical Activity: Not on file  Stress: Not on file  Social Connections: Not on file   Additional Social History:                         Sleep: Fair  Appetite:  Fair  Current Medications: Current Facility-Administered Medications  Medication Dose Route Frequency Provider Last Rate Last Admin   acetaminophen (TYLENOL) tablet 650 mg  650 mg Oral Q6H PRN Gabriel Cirri F, NP   650 mg at 06/19/21 0721   alum & mag hydroxide-simeth (MAALOX/MYLANTA) 200-200-20 MG/5ML suspension 30 mL  30 mL Oral Q4H PRN Gabriel Cirri F, NP       divalproex (DEPAKOTE) DR tablet 500 mg  500 mg Oral Q12H Gabriel Cirri F, NP   500 mg at 06/19/21 0720   FLUoxetine (PROZAC) capsule 20 mg  20 mg  Oral Daily Gabriel Cirri F, NP   20 mg at 06/19/21 0720   lidocaine (LIDODERM) 5 % 1 patch  1 patch Transdermal Q24H Wilbur Oakland, Jackquline Denmark, MD       magnesium hydroxide (MILK OF MAGNESIA) suspension 30 mL  30 mL Oral Daily PRN Gabriel Cirri F, NP       nicotine (NICODERM CQ - dosed in mg/24 hours) patch 14 mg  14 mg Transdermal Daily Ereka Brau T, MD   14 mg at 06/19/21 7106   QUEtiapine (SEROQUEL) tablet 200 mg  200 mg Oral QHS Kawena Lyday T, MD       QUEtiapine (SEROQUEL) tablet 50 mg  50 mg Oral BID Gabriel Cirri F, NP   50 mg at 06/19/21 0720    Lab Results: No results found for this or any previous visit (from the past 48 hour(s)).  Blood Alcohol level:  Lab Results  Component Value Date   ETH <10 06/14/2021   ETH <10  04/20/2021    Metabolic Disorder Labs: Lab Results  Component Value Date   HGBA1C 5.6 06/17/2021   MPG 114 06/17/2021   MPG 114.02 10/02/2020   Lab Results  Component Value Date   PROLACTIN 33.2 (H) 08/15/2016   Lab Results  Component Value Date   CHOL 169 06/17/2021   TRIG 65 06/17/2021   HDL 45 06/17/2021   CHOLHDL 3.8 06/17/2021   VLDL 13 06/17/2021   LDLCALC 111 (H) 06/17/2021   LDLCALC 96 10/02/2020    Physical Findings: AIMS:  , ,  ,  ,    CIWA:    COWS:     Musculoskeletal: Strength & Muscle Tone: within normal limits Gait & Station: normal Patient leans: N/A  Psychiatric Specialty Exam:  Presentation  General Appearance: Appropriate for Environment  Eye Contact:Fair  Speech:Normal Rate  Speech Volume:Normal  Handedness:Right   Mood and Affect  Mood:Euthymic  Affect:Appropriate   Thought Process  Thought Processes:Coherent  Descriptions of Associations:Intact  Orientation:Full (Time, Place and Person)  Thought Content:Logical  History of Schizophrenia/Schizoaffective disorder:Yes  Duration of Psychotic Symptoms:Greater than six months  Hallucinations:No data recorded Ideas of Reference:None  Suicidal Thoughts:No data recorded Homicidal Thoughts:No data recorded  Sensorium  Memory:Immediate Good; Recent Good; Remote Good  Judgment:Fair  Insight:Fair   Executive Functions  Concentration:Good  Attention Span:Good  Recall:Fair  Fund of Knowledge:Fair  Language:Good   Psychomotor Activity  Psychomotor Activity:No data recorded  Assets  Assets:Communication Skills; Desire for Improvement; Financial Resources/Insurance; Resilience; Social Support   Sleep  Sleep:No data recorded   Physical Exam: Physical Exam Vitals and nursing note reviewed.  Constitutional:      Appearance: Normal appearance.  HENT:     Head: Normocephalic and atraumatic.     Mouth/Throat:     Pharynx: Oropharynx is clear.  Eyes:      Pupils: Pupils are equal, round, and reactive to light.  Cardiovascular:     Rate and Rhythm: Normal rate and regular rhythm.  Pulmonary:     Effort: Pulmonary effort is normal.     Breath sounds: Normal breath sounds.  Abdominal:     General: Abdomen is flat.     Palpations: Abdomen is soft.  Musculoskeletal:        General: Normal range of motion.  Skin:    General: Skin is warm and dry.  Neurological:     General: No focal deficit present.     Mental Status: He is alert. Mental status is at baseline.  Psychiatric:  Attention and Perception: He is inattentive.        Mood and Affect: Mood normal. Affect is blunt.        Speech: He is noncommunicative.        Behavior: Behavior is slowed.        Thought Content: Thought content normal.        Cognition and Memory: Cognition is impaired.   Review of Systems  Constitutional: Negative.   HENT: Negative.    Eyes: Negative.   Respiratory: Negative.    Cardiovascular: Negative.   Gastrointestinal: Negative.   Musculoskeletal:  Positive for back pain.  Skin: Negative.   Neurological: Negative.   Psychiatric/Behavioral:  The patient has insomnia.   Blood pressure 123/72, pulse 67, temperature 98.3 F (36.8 C), temperature source Oral, resp. rate 17, height 6\' 3"  (1.905 m), weight 93.4 kg, SpO2 100 %. Body mass index is 25.75 kg/m.   Treatment Plan Summary: Medication management and Plan increase Seroquel to 200 mg.  Probably a good idea to keep going up on that dosage.  Continue Prozac.  Encourage group attendance.  Encourage patient to work with treatment team and social work on possible discharge planning.  At the suggestion of nursing adding a an order for lidocaine patch for his back  , MD 06/19/2021, 1:03 PM

## 2021-06-19 NOTE — Group Note (Signed)
[facility] LCSW Group Therapy Note   Group Date: 06/19/2021 Start Time: 1300 End Time: 1400  Type of Therapy and Topic:  Group Therapy:  Feelings around Relapse and Recovery  Participation Level:  Did Not Attend   Mood:  Description of Group:    Patients in this group will discuss emotions they experience before and after a relapse. They will process how experiencing these feelings, or avoidance of experiencing them, relates to having a relapse. Facilitator will guide patients to explore emotions they have related to recovery. Patients will be encouraged to process which emotions are more powerful. They will be guided to discuss the emotional reaction significant others in their lives may have to patients relapse or recovery. Patients will be assisted in exploring ways to respond to the emotions of others without this contributing to a relapse.  Therapeutic Goals: Patient will identify two or more emotions that lead to relapse for them:  Patient will identify two emotions that result when they relapse:  Patient will identify two emotions related to recovery:  Patient will demonstrate ability to communicate their needs through discussion and/or role plays.   Summary of Patient Progress: Patient did not attend group despite encouraged participation.    Therapeutic Modalities:   Cognitive Behavioral Therapy Solution-Focused Therapy Assertiveness Training Relapse Prevention Therapy   Corky Crafts, [place]

## 2021-06-20 NOTE — Progress Notes (Signed)
[facility] MD Progress Note  06/20/2021 4:22 PM Timothy [name]  MRN:  [id number] Subjective: Follow-up 25 year old man with schizophrenia or schizoaffective disorder.    Timothy [name] is calm, but withdrawn, answered my questions with a few words. Timothy [name] said that Timothy [name] was evicted a few months ago, and has been homeless since.   Denied SI, and mostly remains in his room.   Principal Problem: Schizoaffective disorder, bipolar type (HCC) Diagnosis: Principal Problem:   Schizoaffective disorder, bipolar type (HCC) Active Problems:   Homelessness  Total Time spent with patient: 30 minutes  Past Psychiatric History: Past history of schizoaffective disorder with problematic compliance and homelessness  Past Medical History:  Past Medical History:  Diagnosis Date   Cannabis abuse    Paranoid schizophrenia (HCC)    Schizoaffective disorder, depressive type (HCC) 09/17/2014   History reviewed. No pertinent surgical history. Family History:  Family History  Problem Relation Age of Onset   Mental illness Brother    Drug abuse Brother    Mental illness Cousin    Suicidality Cousin    Diabetes Maternal Grandmother    Family Psychiatric  History: See previous Social History:  Social History   Substance and Sexual Activity  Alcohol Use Yes   Alcohol/week: 2.0 standard drinks   Types: 2 Cans of beer per week     Social History   Substance and Sexual Activity  Drug Use Yes   Types: Marijuana, MDMA Chiropodist)    Social History   Socioeconomic History   Marital status: Single    Spouse name: Not on file   Number of children: Not on file   Years of education: Not on file   Highest education level: Not on file  Occupational History   Not on file  Tobacco Use   Smoking status: Some Days    Packs/day: 0.25    Types: Cigarettes   Smokeless tobacco: Never  Vaping Use   Vaping Use: Never used  Substance and Sexual Activity   Alcohol use: Yes    Alcohol/week: 2.0 standard drinks    Types: 2 Cans of  beer per week   Drug use: Yes    Types: Marijuana, MDMA (Ecstacy)   Sexual activity: Yes  Other Topics Concern   Not on file  Social History Narrative   Not on file   Social Determinants of Health   Financial Resource Strain: Not on file  Food Insecurity: Not on file  Transportation Needs: Not on file  Physical Activity: Not on file  Stress: Not on file  Social Connections: Not on file   Additional Social History:      Sleep: Fair  Appetite:  Fair  Current Medications: Current Facility-Administered Medications  Medication Dose Route Frequency Provider Last Rate Last Admin   acetaminophen (TYLENOL) tablet 650 mg  650 mg Oral Q6H PRN Gabriel Cirri F, NP   650 mg at 06/20/21 1432   alum & mag hydroxide-simeth (MAALOX/MYLANTA) 200-200-20 MG/5ML suspension 30 mL  30 mL Oral Q4H PRN Gabriel Cirri F, NP       divalproex (DEPAKOTE) DR tablet 500 mg  500 mg Oral Q12H Gabriel Cirri F, NP   500 mg at 06/20/21 0806   FLUoxetine (PROZAC) capsule 20 mg  20 mg Oral Daily Gabriel Cirri F, NP   20 mg at 06/20/21 0807   lidocaine (LIDODERM) 5 % 1 patch  1 patch Transdermal Q24H Clapacs, Jackquline Denmark, MD   1 patch at 06/20/21 1433   magnesium hydroxide (MILK OF  MAGNESIA) suspension 30 mL  30 mL Oral Daily PRN Gabriel Cirri F, NP       nicotine (NICODERM CQ - dosed in mg/24 hours) patch 14 mg  14 mg Transdermal Daily Clapacs, John T, MD   14 mg at 06/20/21 6761   QUEtiapine (SEROQUEL) tablet 200 mg  200 mg Oral QHS Clapacs, John T, MD   200 mg at 06/19/21 2114   QUEtiapine (SEROQUEL) tablet 50 mg  50 mg Oral BID Gabriel Cirri F, NP   50 mg at 06/20/21 9509    Lab Results: No results found for this or any previous visit (from the past 48 hour(s)).  Blood Alcohol level:  Lab Results  Component Value Date   ETH <10 06/14/2021   ETH <10 04/20/2021    Metabolic Disorder Labs: Lab Results  Component Value Date   HGBA1C 5.6 06/17/2021   MPG 114 06/17/2021   MPG 114.02 10/02/2020    Lab Results  Component Value Date   PROLACTIN 33.2 (H) 08/15/2016   Lab Results  Component Value Date   CHOL 169 06/17/2021   TRIG 65 06/17/2021   HDL 45 06/17/2021   CHOLHDL 3.8 06/17/2021   VLDL 13 06/17/2021   LDLCALC 111 (H) 06/17/2021   LDLCALC 96 10/02/2020    Physical Findings: AIMS:  , ,  ,  ,    CIWA:    COWS:     Musculoskeletal: Strength & Muscle Tone: within normal limits Gait & Station: normal Patient leans: N/A  Psychiatric Specialty Exam:  Presentation  General Appearance: Appropriate for Environment  Eye Contact:Fair  Speech:Normal Rate  Speech Volume:Normal  Handedness:Right   Mood and Affect  Mood:Euthymic  Affect:Appropriate   Thought Process  Thought Processes:Coherent  Descriptions of Associations:Intact  Orientation:Full (Time, Place and Person)  Thought Content:Logical  History of Schizophrenia/Schizoaffective disorder:Yes  Duration of Psychotic Symptoms:Greater than six months  Hallucinations:No data recorded Ideas of Reference:None  Suicidal Thoughts:No data recorded Homicidal Thoughts:No data recorded  Sensorium  Memory:Immediate Good; Recent Good; Remote Good  Judgment:Fair  Insight:Fair   Executive Functions  Concentration:Good  Attention Span:Good  Recall:Fair  Fund of Knowledge:Fair  Language:Good   Psychomotor Activity  Psychomotor Activity:No data recorded  Assets  Assets:Communication Skills; Desire for Improvement; Financial Resources/Insurance; Resilience; Social Support   Sleep  Sleep:No data recorded   Physical Exam: Physical Exam Vitals and nursing note reviewed.  Constitutional:      Appearance: Normal appearance.  HENT:     Head: Normocephalic and atraumatic.     Mouth/Throat:     Pharynx: Oropharynx is clear.  Eyes:     Pupils: Pupils are equal, round, and reactive to light.  Cardiovascular:     Rate and Rhythm: Normal rate and regular rhythm.  Pulmonary:      Effort: Pulmonary effort is normal.     Breath sounds: Normal breath sounds.  Abdominal:     General: Abdomen is flat.     Palpations: Abdomen is soft.  Musculoskeletal:        General: Normal range of motion.  Skin:    General: Skin is warm and dry.  Neurological:     General: No focal deficit present.     Mental Status: Judy Goodenow is alert. Mental status is at baseline.  Psychiatric:        Attention and Perception: Terrin Imparato is inattentive.        Mood and Affect: Mood normal. Affect is blunt.        Speech: Kariana Wiles  is noncommunicative.        Behavior: Behavior is slowed.        Thought Content: Thought content normal.        Cognition and Memory: Cognition is impaired.   Review of Systems  Constitutional: Negative.   HENT: Negative.    Eyes: Negative.   Respiratory: Negative.    Cardiovascular: Negative.   Gastrointestinal: Negative.   Musculoskeletal:  Positive for back pain.  Skin: Negative.   Neurological: Negative.   Psychiatric/Behavioral:  The patient has insomnia.   Blood pressure 111/72, pulse 84, temperature 98.2 F (36.8 C), temperature source Oral, resp. rate 18, height 6\' 3"  (1.905 m), weight 93.4 kg, SpO2 100 %. Body mass index is 25.75 kg/m.   Treatment Plan Summary: Medication management and Plan increase Seroquel to 200 mg.  Probably a good idea to keep going up on that dosage.  Continue Prozac.  Encourage group attendance.  Encourage patient to work with treatment team and social work on possible discharge planning.  At the suggestion of nursing adding a an order for lidocaine patch for his back  Schizophrenia, paranoid type -- continue Seroquel 50mg  po BID and 200mg  qhs.  -- continue Depakote DR 500mg  BID (VPA pending).  -- continue Prozac 20mg  daily.   -- working with SW team for disposition.    Myalynn Lingle, MD 06/20/2021, 4:22 PM

## 2021-06-20 NOTE — Group Note (Signed)
LCSW Group Therapy Note  Group Date: 06/20/2021 Start Time: 1310 End Time: 1355   Type of Therapy and Topic:  Group Therapy - Healthy vs Unhealthy Coping Skills  Participation Level:  Active   Description of Group The focus of this group was to determine what unhealthy coping techniques typically are used by group members and what healthy coping techniques would be helpful in coping with various problems. Patients were guided in becoming aware of the differences between healthy and unhealthy coping techniques. Patients were asked to identify 2-3 healthy coping skills they would like to learn to use more effectively.  Therapeutic Goals Patients learned that coping is what human beings do all day long to deal with various situations in their lives Patients defined and discussed healthy vs unhealthy coping techniques Patients identified their preferred coping techniques and identified whether these were healthy or unhealthy Patients determined 2-3 healthy coping skills they would like to become more familiar with and use more often. Patients provided support and ideas to each other   Summary of Patient Progress: Patient was present for the duration of group and participated in the icebreaker activity. Patient shared that listening to nature music and doing pushups were two coping skills patient has used in the past. Patient shared that he is currently homeless and is living in a tent. Patient stated he is focused on finding a job at a [employer] and staying healthy.    Therapeutic Modalities Cognitive Behavioral Therapy Motivational Interviewing  Norberto Sorenson, Theresia Majors 06/20/2021  5:16 PM

## 2021-06-20 NOTE — Progress Notes (Signed)
Patient present on the milieu this day. Cooperative with staff and peers. Present for all meals. Denies SI/HI/AH/VH. Endorses depression and anxiety. Rates 10/10. Reports pain 6/10. Pain medication given this shift along with lidocaine patch.   Labs and vital signs monitored. Patient supported emotionally and encouraged to verbalize concerns. All concerns addressed this shift.    No adverse effects to medications noted. Cont Q15 minute check for safety.

## 2021-06-20 NOTE — Plan of Care (Signed)
  Problem: Education: Goal: Knowledge of [facility] Education information/materials will improve Outcome: Progressing Goal: Emotional status will improve Outcome: Progressing Goal: Mental status will improve Outcome: Progressing Goal: Verbalization of understanding the information provided will improve Outcome: Progressing   Problem: Activity: Goal: Interest or engagement in activities will improve Outcome: Progressing Goal: Sleeping patterns will improve Outcome: Progressing   Problem: Coping: Goal: Ability to verbalize frustrations and anger appropriately will improve Outcome: Progressing Goal: Ability to demonstrate self-control will improve Outcome: Progressing   Problem: Health Behavior/Discharge Planning: Goal: Identification of resources available to assist in meeting health care needs will improve Outcome: Progressing Goal: Compliance with treatment plan for underlying cause of condition will improve Outcome: Progressing   Problem: Physical Regulation: Goal: Ability to maintain clinical measurements within normal limits will improve Outcome: Progressing   Problem: Safety: Goal: Periods of time without injury will increase Outcome: Progressing   Problem: Education: Goal: Utilization of techniques to improve thought processes will improve Outcome: Progressing Goal: Knowledge of the prescribed therapeutic regimen will improve Outcome: Progressing   Problem: Activity: Goal: Interest or engagement in leisure activities will improve Outcome: Progressing Goal: Imbalance in normal sleep/wake cycle will improve Outcome: Progressing   Problem: Coping: Goal: Coping ability will improve Outcome: Progressing Goal: Will verbalize feelings Outcome: Progressing   Problem: Health Behavior/Discharge Planning: Goal: Ability to make decisions will improve Outcome: Progressing Goal: Compliance with therapeutic regimen will improve Outcome: Progressing    Problem: Role Relationship: Goal: Will demonstrate positive changes in social behaviors and relationships Outcome: Progressing   Problem: Safety: Goal: Ability to disclose and discuss suicidal ideas will improve Outcome: Progressing Goal: Ability to identify and utilize support systems that promote safety will improve Outcome: Progressing   Problem: Self-Concept: Goal: Will verbalize positive feelings about self Outcome: Progressing Goal: Level of anxiety will decrease Outcome: Progressing   

## 2021-06-20 NOTE — Progress Notes (Signed)
Patient remains isolative to room and to self. Denies SI, HI and AVH. Medication compliant

## 2021-06-21 MED ORDER — MENTHOL 3 MG MT LOZG
1.0000 | LOZENGE | OROMUCOSAL | Status: DC | PRN
  Administered 2021-06-24: 08:00:00 3 mg via ORAL
  Filled 2021-06-21 (×2): qty 9

## 2021-06-21 NOTE — Progress Notes (Signed)
Patient present on the milieu this day. Cooperative with staff and peers. Present for all meals. Denies SI/HI/AH/VH. Endorses depression 5/10 and anxiety 7/10.  Reports pain 6/10. Pain medication given this shift. Patient requested lidocaine patch be moved to night shift.  along with lidocaine patch.    Labs and vital signs monitored. Patient supported emotionally and encouraged to verbalize concerns. All concerns addressed this shift.      No adverse effects to medications noted. Cont Q15 minute check for safety.

## 2021-06-21 NOTE — Plan of Care (Signed)
°  Problem: Education: Goal: Knowledge of [facility] Education information/materials will improve Outcome: Progressing Goal: Emotional status will improve Outcome: Progressing Goal: Mental status will improve Outcome: Progressing Goal: Verbalization of understanding the information provided will improve Outcome: Progressing   Problem: Activity: Goal: Interest or engagement in activities will improve Outcome: Progressing Goal: Sleeping patterns will improve Outcome: Progressing   Problem: Coping: Goal: Ability to verbalize frustrations and anger appropriately will improve Outcome: Progressing Goal: Ability to demonstrate self-control will improve Outcome: Progressing   Problem: Health Behavior/Discharge Planning: Goal: Identification of resources available to assist in meeting health care needs will improve Outcome: Progressing Goal: Compliance with treatment plan for underlying cause of condition will improve Outcome: Progressing   Problem: Physical Regulation: Goal: Ability to maintain clinical measurements within normal limits will improve Outcome: Progressing   Problem: Education: Goal: Utilization of techniques to improve thought processes will improve Outcome: Progressing Goal: Knowledge of the prescribed therapeutic regimen will improve Outcome: Progressing   Problem: Activity: Goal: Interest or engagement in leisure activities will improve Outcome: Progressing Goal: Imbalance in normal sleep/wake cycle will improve Outcome: Progressing   Problem: Safety: Goal: Periods of time without injury will increase Outcome: Progressing   Problem: Coping: Goal: Coping ability will improve Outcome: Progressing Goal: Will verbalize feelings Outcome: Progressing   Problem: Health Behavior/Discharge Planning: Goal: Ability to make decisions will improve Outcome: Progressing Goal: Compliance with therapeutic regimen will improve Outcome: Progressing    Problem: Role Relationship: Goal: Will demonstrate positive changes in social behaviors and relationships Outcome: Progressing   Problem: Safety: Goal: Ability to disclose and discuss suicidal ideas will improve Outcome: Progressing Goal: Ability to identify and utilize support systems that promote safety will improve Outcome: Progressing   Problem: Self-Concept: Goal: Will verbalize positive feelings about self Outcome: Progressing Goal: Level of anxiety will decrease Outcome: Progressing

## 2021-06-21 NOTE — Progress Notes (Signed)
[facility] MD Progress Note  06/21/2021 11:01 AM Timothy [name]  MRN:  [id number] Subjective: Follow-up 25 year old man with schizophrenia or schizoaffective disorder.    Timothy [name] is calm but somewhat withdrawn, did not want to engage in any conversation.  Tolerates meds without complaints.   Denied SI/H, denied AVH and paranoia  Principal Problem: Schizoaffective disorder, bipolar type (HCC) Diagnosis: Principal Problem:   Schizoaffective disorder, bipolar type (HCC) Active Problems:   Homelessness  Total Time spent with patient: 30 minutes  Past Psychiatric History: Past history of schizoaffective disorder with problematic compliance and homelessness  Past Medical History:  Past Medical History:  Diagnosis Date   Cannabis abuse    Paranoid schizophrenia (HCC)    Schizoaffective disorder, depressive type (HCC) 09/17/2014   History reviewed. No pertinent surgical history. Family History:  Family History  Problem Relation Age of Onset   Mental illness Brother    Drug abuse Brother    Mental illness Cousin    Suicidality Cousin    Diabetes Maternal Grandmother    Family Psychiatric  History: See previous Social History:  Social History   Substance and Sexual Activity  Alcohol Use Yes   Alcohol/week: 2.0 standard drinks   Types: 2 Cans of beer per week     Social History   Substance and Sexual Activity  Drug Use Yes   Types: Marijuana, MDMA Chiropodist)    Social History   Socioeconomic History   Marital status: Single    Spouse name: Not on file   Number of children: Not on file   Years of education: Not on file   Highest education level: Not on file  Occupational History   Not on file  Tobacco Use   Smoking status: Some Days    Packs/day: 0.25    Types: Cigarettes   Smokeless tobacco: Never  Vaping Use   Vaping Use: Never used  Substance and Sexual Activity   Alcohol use: Yes    Alcohol/week: 2.0 standard drinks    Types: 2 Cans of beer per week   Drug use: Yes     Types: Marijuana, MDMA (Ecstacy)   Sexual activity: Yes  Other Topics Concern   Not on file  Social History Narrative   Not on file   Social Determinants of Health   Financial Resource Strain: Not on file  Food Insecurity: Not on file  Transportation Needs: Not on file  Physical Activity: Not on file  Stress: Not on file  Social Connections: Not on file   Additional Social History:      Sleep: Fair  Appetite:  Fair  Current Medications: Current Facility-Administered Medications  Medication Dose Route Frequency Provider Last Rate Last Admin   acetaminophen (TYLENOL) tablet 650 mg  650 mg Oral Q6H PRN Gabriel Cirri F, NP   650 mg at 06/21/21 0854   alum & mag hydroxide-simeth (MAALOX/MYLANTA) 200-200-20 MG/5ML suspension 30 mL  30 mL Oral Q4H PRN Gabriel Cirri F, NP       divalproex (DEPAKOTE) DR tablet 500 mg  500 mg Oral Q12H Gabriel Cirri F, NP   500 mg at 06/21/21 0849   FLUoxetine (PROZAC) capsule 20 mg  20 mg Oral Daily Gabriel Cirri F, NP   20 mg at 06/21/21 0849   lidocaine (LIDODERM) 5 % 1 patch  1 patch Transdermal Q24H Clapacs, John T, MD   1 patch at 06/20/21 1433   magnesium hydroxide (MILK OF MAGNESIA) suspension 30 mL  30 mL Oral Daily PRN Barthold,  Nickola Major, NP       nicotine (NICODERM CQ - dosed in mg/24 hours) patch 14 mg  14 mg Transdermal Daily Clapacs, Jackquline Denmark, MD   14 mg at 06/21/21 1610   QUEtiapine (SEROQUEL) tablet 200 mg  200 mg Oral QHS Clapacs, John T, MD   200 mg at 06/20/21 2131   QUEtiapine (SEROQUEL) tablet 50 mg  50 mg Oral BID Gabriel Cirri F, NP   50 mg at 06/21/21 9604    Lab Results: No results found for this or any previous visit (from the past 48 hour(s)).  Blood Alcohol level:  Lab Results  Component Value Date   ETH <10 06/14/2021   ETH <10 04/20/2021    Metabolic Disorder Labs: Lab Results  Component Value Date   HGBA1C 5.6 06/17/2021   MPG 114 06/17/2021   MPG 114.02 10/02/2020   Lab Results  Component  Value Date   PROLACTIN 33.2 (H) 08/15/2016   Lab Results  Component Value Date   CHOL 169 06/17/2021   TRIG 65 06/17/2021   HDL 45 06/17/2021   CHOLHDL 3.8 06/17/2021   VLDL 13 06/17/2021   LDLCALC 111 (H) 06/17/2021   LDLCALC 96 10/02/2020    Physical Findings: AIMS:  , ,  ,  ,    CIWA:    COWS:     Musculoskeletal: Strength & Muscle Tone: within normal limits Gait & Station: normal Patient leans: N/A  Psychiatric Specialty Exam:  Presentation  General Appearance: Appropriate for Environment  Eye Contact:Fair  Speech:Normal Rate  Speech Volume:Normal  Handedness:Right   Mood and Affect  Mood:Euthymic  Affect:Appropriate   Thought Process  Thought Processes:Coherent  Descriptions of Associations:Intact  Orientation:Full (Time, Place and Person)  Thought Content:Logical  History of Schizophrenia/Schizoaffective disorder:Yes  Duration of Psychotic Symptoms:Greater than six months  Hallucinations:No data recorded Ideas of Reference:None  Suicidal Thoughts:No data recorded Homicidal Thoughts:No data recorded  Sensorium  Memory:Immediate Good; Recent Good; Remote Good  Judgment:Fair  Insight:Fair   Executive Functions  Concentration:Good  Attention Span:Good  Recall:Fair  Fund of Knowledge:Fair  Language:Good   Psychomotor Activity  Psychomotor Activity:No data recorded  Assets  Assets:Communication Skills; Desire for Improvement; Financial Resources/Insurance; Resilience; Social Support  Sleep  Sleep:No data recorded  Physical Exam: Physical Exam Vitals and nursing note reviewed.  Constitutional:      Appearance: Normal appearance.  HENT:     Head: Normocephalic and atraumatic.     Mouth/Throat:     Pharynx: Oropharynx is clear.  Eyes:     Pupils: Pupils are equal, round, and reactive to light.  Cardiovascular:     Rate and Rhythm: Normal rate and regular rhythm.  Pulmonary:     Effort: Pulmonary effort is normal.      Breath sounds: Normal breath sounds.  Abdominal:     General: Abdomen is flat.     Palpations: Abdomen is soft.  Musculoskeletal:        General: Normal range of motion.  Skin:    General: Skin is warm and dry.  Neurological:     General: No focal deficit present.     Mental Status: Timothy [name] is alert. Mental status is at baseline.  Psychiatric:        Attention and Perception: Jupiter Boys is inattentive.        Mood and Affect: Mood normal. Affect is blunt.        Speech: Isak Sotomayor is noncommunicative.        Behavior: Behavior is slowed.  Thought Content: Thought content normal.        Cognition and Memory: Cognition is impaired.   Review of Systems  Constitutional: Negative.   HENT: Negative.    Eyes: Negative.   Respiratory: Negative.    Cardiovascular: Negative.   Gastrointestinal: Negative.   Musculoskeletal:  Positive for back pain.  Skin: Negative.   Neurological: Negative.   Psychiatric/Behavioral:  The patient has insomnia.   Blood pressure (!) 126/58, pulse 69, temperature 98 F (36.7 C), temperature source Oral, resp. rate 18, height 6\' 3"  (1.905 m), weight 93.4 kg, SpO2 100 %. Body mass index is 25.75 kg/m.   Treatment Plan Summary: Medication management and Plan increase Seroquel to 200 mg.  Probably a good idea to keep going up on that dosage.  Continue Prozac.  Encourage group attendance.  Encourage patient to work with treatment team and social work on possible discharge planning.  At the suggestion of nursing adding a an order for lidocaine patch for his back  Schizophrenia, paranoid type -- continue Seroquel 50mg  po BID and 200mg  qhs. (QTc is 375 on 04/17/21) -- continue Depakote DR 500mg  BID (VPA pending).  -- continue Prozac 20mg  daily.   -- working with SW team for disposition.    Chriselda Leppert, MD 06/21/2021, 11:01 AM

## 2021-06-21 NOTE — Plan of Care (Signed)
  Problem: Education: Goal: Knowledge of [facility] Education information/materials will improve Outcome: Progressing Goal: Emotional status will improve Outcome: Progressing Goal: Mental status will improve Outcome: Progressing Goal: Verbalization of understanding the information provided will improve Outcome: Progressing   Problem: Activity: Goal: Interest or engagement in activities will improve Outcome: Progressing Goal: Sleeping patterns will improve Outcome: Progressing   Problem: Coping: Goal: Ability to verbalize frustrations and anger appropriately will improve Outcome: Progressing Goal: Ability to demonstrate self-control will improve Outcome: Progressing   Problem: Health Behavior/Discharge Planning: Goal: Identification of resources available to assist in meeting health care needs will improve Outcome: Progressing Goal: Compliance with treatment plan for underlying cause of condition will improve Outcome: Progressing   Problem: Physical Regulation: Goal: Ability to maintain clinical measurements within normal limits will improve Outcome: Progressing   Problem: Safety: Goal: Periods of time without injury will increase Outcome: Progressing   Problem: Education: Goal: Utilization of techniques to improve thought processes will improve Outcome: Progressing Goal: Knowledge of the prescribed therapeutic regimen will improve Outcome: Progressing   Problem: Activity: Goal: Interest or engagement in leisure activities will improve Outcome: Progressing Goal: Imbalance in normal sleep/wake cycle will improve Outcome: Progressing   Problem: Coping: Goal: Coping ability will improve Outcome: Progressing Goal: Will verbalize feelings Outcome: Progressing   Problem: Health Behavior/Discharge Planning: Goal: Ability to make decisions will improve Outcome: Progressing Goal: Compliance with therapeutic regimen will improve Outcome: Progressing    Problem: Role Relationship: Goal: Will demonstrate positive changes in social behaviors and relationships Outcome: Progressing   Problem: Safety: Goal: Ability to disclose and discuss suicidal ideas will improve Outcome: Progressing Goal: Ability to identify and utilize support systems that promote safety will improve Outcome: Progressing   Problem: Self-Concept: Goal: Will verbalize positive feelings about self Outcome: Progressing Goal: Level of anxiety will decrease Outcome: Progressing   

## 2021-06-21 NOTE — Group Note (Signed)
LCSW Group Therapy Note  Group Date: 06/21/2021 Start Time: 1330 End Time: 1430   Type of Therapy and Topic:  Group Therapy - How To Cope with Nervousness about Discharge   Participation Level:  Did Not Attend   Description of Group This process group involved identification of patients' feelings about discharge. Some of them are scheduled to be discharged soon, while others are new admissions, but each of them was asked to share thoughts and feelings surrounding discharge from the hospital. One common theme was that they are excited at the prospect of going home, while another was that many of them are apprehensive about sharing why they were hospitalized. Patients were given the opportunity to discuss these feelings with their peers in preparation for discharge.  Therapeutic Goals  Patient will identify their overall feelings about pending discharge. Patient will think about how they might proactively address issues that they believe will once again arise once they get home (i.e. with parents). Patients will participate in discussion about having hope for change.   Summary of Patient Progress: Patient did not attend group despite encouraged participation.    Therapeutic Modalities Cognitive Behavioral Therapy   Marletta Lor 06/21/2021  4:28 PM

## 2021-06-21 NOTE — Progress Notes (Signed)
Patient has been quiet, calm and cooperative. Isolative to room and to self. Medication compliant. Affect is very flat and mood is depressed. Denies SI, HI and AVH

## 2021-06-22 NOTE — BH IP Treatment Plan (Signed)
Interdisciplinary Treatment and Diagnostic Plan Update  06/22/2021 Time of Session: 0900  GLENN GULLICKSON MRN: [id number]  Principal Diagnosis: Schizoaffective disorder, bipolar type [facility])  Secondary Diagnoses: Principal Problem:   Schizoaffective disorder, bipolar type ([place]) Active Problems:   Homelessness   Current Medications:  Current Facility-Administered Medications  Medication Dose Route Frequency Provider Last Rate Last Admin   acetaminophen (TYLENOL) tablet 650 mg  650 mg Oral Q6H PRN Sherlon Handing, NP   650 mg at 06/21/21 1608   alum & mag hydroxide-simeth (MAALOX/MYLANTA) 200-200-20 MG/5ML suspension 30 mL  30 mL Oral Q4H PRN Waldon Merl F, NP       divalproex (DEPAKOTE) DR tablet 500 mg  500 mg Oral Q12H Waldon Merl F, NP   500 mg at 06/22/21 0856   FLUoxetine (PROZAC) capsule 20 mg  20 mg Oral Daily Waldon Merl F, NP   20 mg at 06/22/21 0856   lidocaine (LIDODERM) 5 % 1 patch  1 patch Transdermal Q24H Clapacs, Madie Reno, MD   1 patch at 06/21/21 2105   magnesium hydroxide (MILK OF MAGNESIA) suspension 30 mL  30 mL Oral Daily PRN Sherlon Handing, NP       menthol-cetylpyridinium (CEPACOL) lozenge 3 mg  1 lozenge Oral PRN He, Jun, MD       nicotine (NICODERM CQ - dosed in mg/24 hours) patch 14 mg  14 mg Transdermal Daily Clapacs, Madie Reno, MD   14 mg at 06/22/21 0856   QUEtiapine (SEROQUEL) tablet 200 mg  200 mg Oral QHS Clapacs, John T, MD   200 mg at 06/21/21 2104   QUEtiapine (SEROQUEL) tablet 50 mg  50 mg Oral BID Waldon Merl F, NP   50 mg at 06/22/21 2878   PTA Medications: Medications Prior to Admission  Medication Sig Dispense Refill Last Dose   divalproex (DEPAKOTE) 500 MG DR tablet Take 1 tablet (500 mg total) by mouth every 12 (twelve) hours. 60 tablet 1    FLUoxetine (PROZAC) 40 MG capsule Take 1 capsule (40 mg total) by mouth daily. 30 capsule 1    prazosin (MINIPRESS) 1 MG capsule Take 1 capsule (1 mg total) by mouth at bedtime. 30  capsule 1    QUEtiapine (SEROQUEL) 400 MG tablet Take 1 tablet (400 mg total) by mouth at bedtime. 30 tablet 1    QUEtiapine (SEROQUEL) 50 MG tablet Take 1 tablet (50 mg total) by mouth 2 (two) times daily. 60 tablet 1    traZODone (DESYREL) 50 MG tablet TAKE 1 TABLET BY MOUTH AT BEDTIME AS NEEDED FOR SLEEP. 7 tablet 0    traZODone (DESYREL) 50 MG tablet Take 1 tablet (50 mg total) by mouth at bedtime as needed for sleep. 30 tablet 1     Patient Stressors: Financial difficulties   Medication change or noncompliance   Substance abuse   Other: Accomodation    Patient Strengths: Ability for insight  Capable of independent living  Communication skills  General fund of knowledge   Treatment Modalities: Medication Management, Group therapy, Case management,  1 to 1 session with clinician, Psychoeducation, Recreational therapy.   Physician Treatment Plan for Primary Diagnosis: Schizoaffective disorder, bipolar type ([place]) Long Term Goal(s): Improvement in symptoms so as ready for discharge   Short Term Goals: Ability to maintain clinical measurements within normal limits will improve Compliance with prescribed medications will improve Ability to verbalize feelings will improve Ability to identify and develop effective coping behaviors will improve  Medication Management: Evaluate patient's  response, side effects, and tolerance of medication regimen.  Therapeutic Interventions: 1 to 1 sessions, Unit Group sessions and Medication administration.  Evaluation of Outcomes: Not Met  Physician Treatment Plan for Secondary Diagnosis: Principal Problem:   Schizoaffective disorder, bipolar type ([place]) Active Problems:   Homelessness  Long Term Goal(s): Improvement in symptoms so as ready for discharge   Short Term Goals: Ability to maintain clinical measurements within normal limits will improve Compliance with prescribed medications will improve Ability to verbalize feelings will  improve Ability to identify and develop effective coping behaviors will improve     Medication Management: Evaluate patient's response, side effects, and tolerance of medication regimen.  Therapeutic Interventions: 1 to 1 sessions, Unit Group sessions and Medication administration.  Evaluation of Outcomes: Not Met   RN Treatment Plan for Primary Diagnosis: Schizoaffective disorder, bipolar type ([place]) Long Term Goal(s): Knowledge of disease and therapeutic regimen to maintain health will improve  Short Term Goals: Ability to remain free from injury will improve, Ability to verbalize frustration and anger appropriately will improve, Ability to demonstrate self-control, Ability to participate in decision making will improve, Ability to verbalize feelings will improve, Ability to disclose and discuss suicidal ideas, Ability to identify and develop effective coping behaviors will improve, and Compliance with prescribed medications will improve  Medication Management: RN will administer medications as ordered by provider, will assess and evaluate patient's response and provide education to patient for prescribed medication. RN will report any adverse and/or side effects to prescribing provider.  Therapeutic Interventions: 1 on 1 counseling sessions, Psychoeducation, Medication administration, Evaluate responses to treatment, Monitor vital signs and CBGs as ordered, Perform/monitor CIWA, COWS, AIMS and Fall Risk screenings as ordered, Perform wound care treatments as ordered.  Evaluation of Outcomes: Not Met   LCSW Treatment Plan for Primary Diagnosis: Schizoaffective disorder, bipolar type ([place]) Long Term Goal(s): Safe transition to appropriate next level of care at discharge, Engage patient in therapeutic group addressing interpersonal concerns.  Short Term Goals: Engage patient in aftercare planning with referrals and resources, Increase social support, Increase ability to appropriately  verbalize feelings, Increase emotional regulation, Facilitate acceptance of mental health diagnosis and concerns, Facilitate patient progression through stages of change regarding substance use diagnoses and concerns, Identify triggers associated with mental health/substance abuse issues, and Increase skills for wellness and recovery  Therapeutic Interventions: Assess for all discharge needs, 1 to 1 time with Social worker, Explore available resources and support systems, Assess for adequacy in community support network, Educate family and significant other(s) on suicide prevention, Complete Psychosocial Assessment, Interpersonal group therapy.  Evaluation of Outcomes: Not Met   Progress in Treatment: Attending groups: No. Participating in groups: No. Taking medication as prescribed: Yes. Toleration medication: Yes. Family/Significant other contact made: No, will contact:  CSW unable to rach collateral, multiple attempts made.  Patient understands diagnosis: Yes. Discussing patient identified problems/goals with staff: Yes. Medical problems stabilized or resolved: Yes. Denies suicidal/homicidal ideation: Yes. Issues/concerns per patient self-inventory: Yes. Other: none   New problem(s) identified: No, Describe:  No additional problems identified at this time.   New Short Term/Long Term Goal(s): Paient to continue working on detox, elimination of symptoms of psychosis, medication management for mood stabilization; elimination of SI thoughts; development of comprehensive mental wellness/sobriety plan.  Patient Goals:  No additional goals identified at this time.   Discharge Plan or Barriers: Patient is homeless, CSW to determine availability of shelter options once appropriate for discharge.   Reason for Continuation of Hospitalization: Anxiety Depression  Medication stabilization  Estimated Length of Stay: TBD   Scribe for Treatment Team: Larose Kells 06/22/2021 12:49  PM

## 2021-06-22 NOTE — Progress Notes (Signed)
Patient has been isolative to self and room. Forwards little. Denies SI, HI and AVH

## 2021-06-22 NOTE — Progress Notes (Signed)
Recreation Therapy Notes   Date: 06/22/2021  Time: 10:15am    Location:  Craft room    Behavioral response: N/A   Intervention Topic: Problem Solving   Discussion/Intervention: Patient did not attend group.  Clinical Observations/Feedback:  Patient did not attend group.   Keaton Stirewalt LRT/CTRS        Drako Maese 06/22/2021 12:07 PM

## 2021-06-22 NOTE — Plan of Care (Signed)
°  Problem: Health Behavior/Discharge Planning: Goal: Ability to make decisions will improve 06/22/2021 1200 by Angeline Slim, RN Outcome: Progressing 06/22/2021 1154 by Angeline Slim, RN Outcome: Progressing Goal: Compliance with therapeutic regimen will improve 06/22/2021 1200 by Angeline Slim, RN Outcome: Progressing 06/22/2021 1154 by Angeline Slim, RN Outcome: Progressing   Problem: Safety: Goal: Ability to disclose and discuss suicidal ideas will improve 06/22/2021 1200 by Angeline Slim, RN Outcome: Progressing 06/22/2021 1154 by Angeline Slim, RN Outcome: Progressing Goal: Ability to identify and utilize support systems that promote safety will improve 06/22/2021 1200 by Angeline Slim, RN Outcome: Progressing 06/22/2021 1154 by Angeline Slim, RN Outcome: Progressing   Problem: Self-Concept: Goal: Will verbalize positive feelings about self 06/22/2021 1200 by Angeline Slim, RN Outcome: Progressing 06/22/2021 1154 by Angeline Slim, RN Outcome: Progressing Goal: Level of anxiety will decrease 06/22/2021 1200 by Angeline Slim, RN Outcome: Progressing 06/22/2021 1154 by Angeline Slim, RN Outcome: Progressing   Problem: Education: Goal: Knowledge of [place] [facility] Education information/materials will improve 06/22/2021 1200 by Angeline Slim, RN Outcome: Progressing 06/22/2021 1154 by Angeline Slim, RN Outcome: Progressing Goal: Emotional status will improve 06/22/2021 1200 by Angeline Slim, RN Outcome: Progressing 06/22/2021 1154 by Angeline Slim, RN Outcome: Progressing Goal: Mental status will improve 06/22/2021 1200 by Angeline Slim, RN Outcome: Progressing 06/22/2021 1154 by Angeline Slim, RN Outcome: Progressing Goal: Verbalization of understanding the information provided will improve 06/22/2021 1200 by Angeline Slim, RN Outcome: Progressing 06/22/2021 1154 by Angeline Slim, RN Outcome: Progressing   Problem: Activity: Goal: Interest or engagement in activities will improve 06/22/2021 1200 by Angeline Slim, RN Outcome: Progressing 06/22/2021 1154 by Angeline Slim, RN Outcome: Progressing Goal: Sleeping patterns will improve 06/22/2021 1200 by Angeline Slim, RN Outcome: Progressing 06/22/2021 1154 by Angeline Slim, RN Outcome: Progressing

## 2021-06-22 NOTE — Plan of Care (Signed)
°  Problem: Education: Goal: Knowledge of [place] [facility] Education information/materials will improve Outcome: Progressing Goal: Emotional status will improve Outcome: Progressing Goal: Mental status will improve Outcome: Progressing Goal: Verbalization of understanding the information provided will improve Outcome: Progressing   Problem: Education: Goal: Emotional status will improve Outcome: Progressing   Problem: Activity: Goal: Interest or engagement in activities will improve Outcome: Progressing Goal: Sleeping patterns will improve Outcome: Progressing   Problem: Coping: Goal: Ability to verbalize frustrations and anger appropriately will improve Outcome: Progressing Goal: Ability to demonstrate self-control will improve Outcome: Progressing   Problem: Health Behavior/Discharge Planning: Goal: Identification of resources available to assist in meeting health care needs will improve Outcome: Progressing Goal: Compliance with treatment plan for underlying cause of condition will improve Outcome: Progressing   Problem: Physical Regulation: Goal: Ability to maintain clinical measurements within normal limits will improve Outcome: Progressing   Problem: Safety: Goal: Periods of time without injury will increase Outcome: Progressing   Problem: Education: Goal: Utilization of techniques to improve thought processes will improve Outcome: Progressing Goal: Knowledge of the prescribed therapeutic regimen will improve Outcome: Progressing   Problem: Activity: Goal: Interest or engagement in leisure activities will improve Outcome: Progressing Goal: Imbalance in normal sleep/wake cycle will improve Outcome: Progressing   Problem: Coping: Goal: Coping ability will improve Outcome: Progressing Goal: Will verbalize feelings Outcome: Progressing   Problem: Health Behavior/Discharge Planning: Goal: Ability to make decisions will improve Outcome: Progressing Goal:  Compliance with therapeutic regimen will improve Outcome: Progressing   Problem: Role Relationship: Goal: Will demonstrate positive changes in social behaviors and relationships Outcome: Progressing   Problem: Safety: Goal: Ability to disclose and discuss suicidal ideas will improve Outcome: Progressing Goal: Ability to identify and utilize support systems that promote safety will improve Outcome: Progressing   Problem: Self-Concept: Goal: Will verbalize positive feelings about self Outcome: Progressing Goal: Level of anxiety will decrease Outcome: Progressing

## 2021-06-22 NOTE — Progress Notes (Signed)
Patient isolates in room most of the shift except for meals and meds. Denies SI/HI/AH/VH. Rates anxiety and depression a 5/10. C/o of back pain. See MAR for details.   Labs and vital signs monitored. Patient supported emotionally and encouraged to verbalize needs.   Takes all medication as prescribed. No adverse effects noted. Cont Q15 minute check for safety.

## 2021-06-22 NOTE — Group Note (Signed)
[facility] LCSW Group Therapy Note   Group Date: 06/22/2021 Start Time: 1300 End Time: 1400   Type of Therapy/Topic:  Group Therapy:  Balance in Life  Participation Level:  Did Not Attend   Description of Group:    This group will address the concept of balance and how it feels and looks when one is unbalanced. Patients will be encouraged to process areas in their lives that are out of balance, and identify reasons for remaining unbalanced. Facilitators will guide patients utilizing problem- solving interventions to address and correct the stressor making their life unbalanced. Understanding and applying boundaries will be explored and addressed for obtaining  and maintaining a balanced life. Patients will be encouraged to explore ways to assertively make their unbalanced needs known to significant others in their lives, using other group members and facilitator for support and feedback.  Therapeutic Goals: Patient will identify two or more emotions or situations they have that consume much of in their lives. Patient will identify signs/triggers that life has become out of balance:  Patient will identify two ways to set boundaries in order to achieve balance in their lives:  Patient will demonstrate ability to communicate their needs through discussion and/or role plays  Summary of Patient Progress: Group not held due to acuity on the unit.     Therapeutic Modalities:   Cognitive Behavioral Therapy Solution-Focused Therapy Assertiveness Training   Jacqulyn Ducking [place], [place]

## 2021-06-22 NOTE — Plan of Care (Signed)
  Problem: Education: Goal: Knowledge of [place] [facility] Education information/materials will improve Outcome: Progressing Goal: Emotional status will improve Outcome: Progressing Goal: Mental status will improve Outcome: Progressing Goal: Verbalization of understanding the information provided will improve Outcome: Progressing   Problem: Activity: Goal: Interest or engagement in activities will improve Outcome: Progressing Goal: Sleeping patterns will improve Outcome: Progressing   Problem: Coping: Goal: Ability to verbalize frustrations and anger appropriately will improve Outcome: Progressing Goal: Ability to demonstrate self-control will improve Outcome: Progressing   Problem: Health Behavior/Discharge Planning: Goal: Identification of resources available to assist in meeting health care needs will improve Outcome: Progressing Goal: Compliance with treatment plan for underlying cause of condition will improve Outcome: Progressing   Problem: Physical Regulation: Goal: Ability to maintain clinical measurements within normal limits will improve Outcome: Progressing   Problem: Safety: Goal: Periods of time without injury will increase Outcome: Progressing   Problem: Education: Goal: Utilization of techniques to improve thought processes will improve Outcome: Progressing Goal: Knowledge of the prescribed therapeutic regimen will improve Outcome: Progressing   Problem: Activity: Goal: Interest or engagement in leisure activities will improve Outcome: Progressing Goal: Imbalance in normal sleep/wake cycle will improve Outcome: Progressing   Problem: Coping: Goal: Coping ability will improve Outcome: Progressing Goal: Will verbalize feelings Outcome: Progressing   Problem: Health Behavior/Discharge Planning: Goal: Ability to make decisions will improve Outcome: Progressing Goal: Compliance with therapeutic regimen will improve Outcome: Progressing    Problem: Role Relationship: Goal: Will demonstrate positive changes in social behaviors and relationships Outcome: Progressing   Problem: Safety: Goal: Ability to disclose and discuss suicidal ideas will improve Outcome: Progressing Goal: Ability to identify and utilize support systems that promote safety will improve Outcome: Progressing   Problem: Self-Concept: Goal: Will verbalize positive feelings about self Outcome: Progressing Goal: Level of anxiety will decrease Outcome: Progressing   

## 2021-06-22 NOTE — Progress Notes (Addendum)
[facility] MD Progress Note  06/22/2021 12:05 PM Timothy [name]  MRN:  [id number] Subjective: Follow-up 25 year old man with schizophrenia or schizoaffective disorder.    Client is pleasant on assessment, smiling at times.  Denies depression, low to moderate anxiety.  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  Currently, he is living in a tent.  When I discussed better living arrangements as it is cold outside, he stated, "I'm used to it", despite me telling him it was 25 degrees this morning.  Eating heartily with no issued, sleep is fair related to back pain, receiving Tylenol which does help.  Denies psychosis, his last hallucinations per client was a month ago.  Interacting appropriately in the day room.  Principal Problem: Schizoaffective disorder, bipolar type ([place]) Diagnosis: Principal Problem:   Schizoaffective disorder, bipolar type ([place]) Active Problems:   Homelessness  Total Time spent with patient: 30 minutes  Past Psychiatric History: Past history of schizoaffective disorder with problematic compliance and homelessness  Past Medical History:  Past Medical History:  Diagnosis Date   Cannabis abuse    Paranoid schizophrenia ([place])    Schizoaffective disorder, depressive type ([place]) 09/17/2014   History reviewed. No pertinent surgical history. Family History:  Family History  Problem Relation Age of Onset   Mental illness Brother    Drug abuse Brother    Mental illness Cousin    Suicidality Cousin    Diabetes Maternal Grandmother    Family Psychiatric  History: See previous Social History:  Social History   Substance and Sexual Activity  Alcohol Use Yes   Alcohol/week: 2.0 standard drinks   Types: 2 Cans of beer per week     Social History   Substance and Sexual Activity  Drug Use Yes   Types: Marijuana, MDMA Banker)    Social History   Socioeconomic History   Marital status: Single    Spouse name: Not on file   Number of children: Not on  file   Years of education: Not on file   Highest education level: Not on file  Occupational History   Not on file  Tobacco Use   Smoking status: Some Days    Packs/day: 0.25    Types: Cigarettes   Smokeless tobacco: Never  Vaping Use   Vaping Use: Never used  Substance and Sexual Activity   Alcohol use: Yes    Alcohol/week: 2.0 standard drinks    Types: 2 Cans of beer per week   Drug use: Yes    Types: Marijuana, MDMA (Ecstacy)   Sexual activity: Yes  Other Topics Concern   Not on file  Social History Narrative   Not on file   Social Determinants of Health   Financial Resource Strain: Not on file  Food Insecurity: Not on file  Transportation Needs: Not on file  Physical Activity: Not on file  Stress: Not on file  Social Connections: Not on file   Additional Social History:      Sleep: Fair  Appetite:  Fair  Current Medications: Current Facility-Administered Medications  Medication Dose Route Frequency Provider Last Rate Last Admin   acetaminophen (TYLENOL) tablet 650 mg  650 mg Oral Q6H PRN Waldon Merl F, NP   650 mg at 06/21/21 1608   alum & mag hydroxide-simeth (MAALOX/MYLANTA) 200-200-20 MG/5ML suspension 30 mL  30 mL Oral Q4H PRN Waldon Merl F, NP       divalproex (DEPAKOTE) DR tablet 500 mg  500 mg Oral Q12H  Vanetta Mulders, NP   500 mg at 06/22/21 0856   FLUoxetine (PROZAC) capsule 20 mg  20 mg Oral Daily Gabriel Cirri F, NP   20 mg at 06/22/21 0856   lidocaine (LIDODERM) 5 % 1 patch  1 patch Transdermal Q24H Clapacs, Jackquline Denmark, MD   1 patch at 06/21/21 2105   magnesium hydroxide (MILK OF MAGNESIA) suspension 30 mL  30 mL Oral Daily PRN Vanetta Mulders, NP       menthol-cetylpyridinium (CEPACOL) lozenge 3 mg  1 lozenge Oral PRN He, Jun, MD       nicotine (NICODERM CQ - dosed in mg/24 hours) patch 14 mg  14 mg Transdermal Daily Clapacs, Jackquline Denmark, MD   14 mg at 06/22/21 0856   QUEtiapine (SEROQUEL) tablet 200 mg  200 mg Oral QHS Clapacs, John T, MD    200 mg at 06/21/21 2104   QUEtiapine (SEROQUEL) tablet 50 mg  50 mg Oral BID Gabriel Cirri F, NP   50 mg at 06/22/21 0932    Lab Results: No results found for this or any previous visit (from the past 48 hour(s)).  Blood Alcohol level:  Lab Results  Component Value Date   ETH <10 06/14/2021   ETH <10 04/20/2021    Metabolic Disorder Labs: Lab Results  Component Value Date   HGBA1C 5.6 06/17/2021   MPG 114 06/17/2021   MPG 114.02 10/02/2020   Lab Results  Component Value Date   PROLACTIN 33.2 (H) 08/15/2016   Lab Results  Component Value Date   CHOL 169 06/17/2021   TRIG 65 06/17/2021   HDL 45 06/17/2021   CHOLHDL 3.8 06/17/2021   VLDL 13 06/17/2021   LDLCALC 111 (H) 06/17/2021   LDLCALC 96 10/02/2020    Physical Findings: AIMS:  , ,  ,  ,    CIWA:    COWS:     Musculoskeletal: Strength & Muscle Tone: within normal limits Gait & Station: normal Patient leans: N/A  Psychiatric Specialty Exam: Physical Exam Vitals and nursing note reviewed.  Constitutional:      Appearance: Normal appearance.  HENT:     Head: Normocephalic and atraumatic.     Nose: Nose normal.  Pulmonary:     Effort: Pulmonary effort is normal.  Abdominal:     General: Abdomen is flat.  Musculoskeletal:        General: Normal range of motion.     Cervical back: Normal range of motion.  Neurological:     General: No focal deficit present.     Mental Status: He is alert and oriented to person, place, and time.  Psychiatric:        Attention and Perception: Attention normal.        Mood and Affect: Mood is anxious. Affect is blunt.        Speech: Speech normal.        Behavior: Behavior normal. Behavior is cooperative.        Thought Content: Thought content normal.        Cognition and Memory: Cognition and memory normal.        Judgment: Judgment normal.    Review of Systems  Constitutional: Negative.   HENT: Negative.    Eyes: Negative.   Respiratory: Negative.     Cardiovascular: Negative.   Gastrointestinal: Negative.   Musculoskeletal:  Positive for back pain.  Skin: Negative.   Neurological: Negative.   Psychiatric/Behavioral:  The patient is nervous/anxious.    Blood  pressure (!) 126/58, pulse 69, temperature 98 F (36.7 C), temperature source Oral, resp. rate 18, height 6\' 3"  (1.905 m), weight 93.4 kg, SpO2 100 %.Body mass index is 25.75 kg/m.  General Appearance: Casual  Eye Contact:  Good  Speech:  Normal Rate  Volume:  Normal  Mood:  Anxious  Affect:  Congruent  Thought Process:  Coherent and Descriptions of Associations: Intact  Orientation:  Full (Time, Place, and Person)  Thought Content:  Logical  Suicidal Thoughts:  No  Homicidal Thoughts:  No  Memory:  Immediate;   Good Recent;   Good Remote;   Good  Judgement:  Fair  Insight:  Fair  Psychomotor Activity:  Normal  Concentration:  Concentration: Good and Attention Span: Good  Recall:  Good  Fund of Knowledge:  Good  Language:  Good  Akathisia:  No  Handed:  Right  AIMS (if indicated):     Assets:  Leisure Time Physical Health Resilience  ADL's:  Intact  Cognition:  WNL  Sleep:  Number of Hours: 8.25     Physical Exam: Physical Exam Vitals and nursing note reviewed.  Constitutional:      Appearance: Normal appearance.  HENT:     Head: Normocephalic and atraumatic.     Nose: Nose normal.  Pulmonary:     Effort: Pulmonary effort is normal.  Abdominal:     General: Abdomen is flat.  Musculoskeletal:        General: Normal range of motion.     Cervical back: Normal range of motion.  Neurological:     General: No focal deficit present.     Mental Status: He is alert and oriented to person, place, and time.  Psychiatric:        Attention and Perception: Attention normal.        Mood and Affect: Mood is anxious. Affect is blunt.        Speech: Speech normal.        Behavior: Behavior normal. Behavior is cooperative.        Thought Content: Thought content  normal.        Cognition and Memory: Cognition and memory normal.        Judgment: Judgment normal.   Review of Systems  Constitutional: Negative.   HENT: Negative.    Eyes: Negative.   Respiratory: Negative.    Cardiovascular: Negative.   Gastrointestinal: Negative.   Musculoskeletal:  Positive for back pain.  Skin: Negative.   Neurological: Negative.   Psychiatric/Behavioral:  The patient is nervous/anxious.   Blood pressure (!) 126/58, pulse 69, temperature 98 F (36.7 C), temperature source Oral, resp. rate 18, height 6\' 3"  (1.905 m), weight 93.4 kg, SpO2 100 %. Body mass index is 25.75 kg/m.   Treatment Plan Summary: Medication management and Plan increase Seroquel to 200 mg.  Probably a good idea to keep going up on that dosage.  Continue Prozac.  Encourage group attendance.  Encourage patient to work with treatment team and social work on possible discharge planning.  At the suggestion of nursing adding a an order for lidocaine patch for his back  Schizophrenia, paranoid type -- continue Seroquel 50mg  po BID and 200mg  qhs. (QTc is 375 on 04/17/21) -- continue Depakote DR 500mg  BID (VPA pending).  -- continue Prozac 20mg  daily.   -- working with SW team for disposition.   Waylan Boga, NP 06/22/2021, 12:05 PM

## 2021-06-23 MED ORDER — NABUMETONE 500 MG PO TABS
500.0000 mg | ORAL_TABLET | Freq: Two times a day (BID) | ORAL | Status: DC
  Administered 2021-06-23 – 2021-06-26 (×6): 500 mg via ORAL
  Filled 2021-06-23 (×7): qty 1

## 2021-06-23 MED ORDER — METHOCARBAMOL 500 MG PO TABS
500.0000 mg | ORAL_TABLET | Freq: Two times a day (BID) | ORAL | Status: DC
  Administered 2021-06-23 – 2021-06-26 (×6): 500 mg via ORAL
  Filled 2021-06-23 (×6): qty 1

## 2021-06-23 NOTE — Progress Notes (Signed)
Out of his room once to get snack after realizing he had slept through the announcement. Continues to isolate. Denies SI, HI and AVH

## 2021-06-23 NOTE — Plan of Care (Signed)
  Problem: Education: Goal: Knowledge of [place] [facility] Education information/materials will improve Outcome: Progressing Goal: Emotional status will improve Outcome: Progressing Goal: Mental status will improve Outcome: Progressing Goal: Verbalization of understanding the information provided will improve Outcome: Progressing   Problem: Activity: Goal: Interest or engagement in activities will improve Outcome: Progressing Goal: Sleeping patterns will improve Outcome: Progressing   Problem: Coping: Goal: Ability to verbalize frustrations and anger appropriately will improve Outcome: Progressing Goal: Ability to demonstrate self-control will improve Outcome: Progressing   Problem: Health Behavior/Discharge Planning: Goal: Identification of resources available to assist in meeting health care needs will improve Outcome: Progressing Goal: Compliance with treatment plan for underlying cause of condition will improve Outcome: Progressing   Problem: Physical Regulation: Goal: Ability to maintain clinical measurements within normal limits will improve Outcome: Progressing   Problem: Safety: Goal: Periods of time without injury will increase Outcome: Progressing   Problem: Education: Goal: Utilization of techniques to improve thought processes will improve Outcome: Progressing Goal: Knowledge of the prescribed therapeutic regimen will improve Outcome: Progressing   Problem: Activity: Goal: Interest or engagement in leisure activities will improve Outcome: Progressing Goal: Imbalance in normal sleep/wake cycle will improve Outcome: Progressing   Problem: Coping: Goal: Coping ability will improve Outcome: Progressing Goal: Will verbalize feelings Outcome: Progressing   Problem: Health Behavior/Discharge Planning: Goal: Ability to make decisions will improve Outcome: Progressing Goal: Compliance with therapeutic regimen will improve Outcome: Progressing    Problem: Role Relationship: Goal: Will demonstrate positive changes in social behaviors and relationships Outcome: Progressing   Problem: Safety: Goal: Ability to disclose and discuss suicidal ideas will improve Outcome: Progressing Goal: Ability to identify and utilize support systems that promote safety will improve Outcome: Progressing   Problem: Self-Concept: Goal: Will verbalize positive feelings about self Outcome: Progressing Goal: Level of anxiety will decrease Outcome: Progressing   

## 2021-06-23 NOTE — Group Note (Signed)
LCSW Group Therapy Note   Group Date: 06/23/2021 Start Time: 1300 End Time: 1400   Type of Therapy and Topic:  Group Therapy: Boundaries  Participation Level:  Did Not Attend  Description of Group: This group will address the use of boundaries in their personal lives. Patients will explore why boundaries are important, the difference between healthy and unhealthy boundaries, and negative and postive outcomes of different boundaries and will look at how boundaries can be crossed.  Patients will be encouraged to identify current boundaries in their own lives and identify what kind of boundary is being set. Facilitators will guide patients in utilizing problem-solving interventions to address and correct types boundaries being used and to address when no boundary is being used. Understanding and applying boundaries will be explored and addressed for obtaining and maintaining a balanced life. Patients will be encouraged to explore ways to assertively make their boundaries and needs known to significant others in their lives, using other group members and facilitator for role play, support, and feedback.  Therapeutic Goals:  1.  Patient will identify areas in their life where setting clear boundaries could be  used to improve their life.  2.  Patient will identify signs/triggers that a boundary is not being respected. 3.  Patient will identify two ways to set boundaries in order to achieve balance in  their lives: 4.  Patient will demonstrate ability to communicate their needs and set boundaries  through discussion and/or role plays  Summary of Patient Progress:   Group not held due to acuity on the unit.   Therapeutic Modalities:   Cognitive Behavioral Therapy Solution-Focused Therapy  Almedia Balls 06/23/2021  4:07 PM

## 2021-06-23 NOTE — Progress Notes (Signed)
[facility] MD Progress Note  06/23/2021 7:05 AM Timothy [name]  MRN:  [id number] Subjective: Follow-up 25 year old man with schizophrenia or schizoaffective disorder.    Client is pleasant on assessment, smiling at times. "Little bit" of depression based on housing and job needs.  Moderate anxiety, no panic attacks. Denies psychosis, his last hallucinations per client was a month ago.  Interacting appropriately in the day room, worked a puzzle.    Principal Problem: Schizoaffective disorder, bipolar type ([place]) Diagnosis: Principal Problem:   Schizoaffective disorder, bipolar type ([place]) Active Problems:   Homelessness  Total Time spent with patient: 30 minutes  Past Psychiatric History: Past history of schizoaffective disorder with problematic compliance and homelessness  Past Medical History:  Past Medical History:  Diagnosis Date   Cannabis abuse    Paranoid schizophrenia ([place])    Schizoaffective disorder, depressive type ([place]) 09/17/2014   History reviewed. No pertinent surgical history. Family History:  Family History  Problem Relation Age of Onset   Mental illness Brother    Drug abuse Brother    Mental illness Cousin    Suicidality Cousin    Diabetes Maternal Grandmother    Family Psychiatric  History: See previous Social History:  Social History   Substance and Sexual Activity  Alcohol Use Yes   Alcohol/week: 2.0 standard drinks   Types: 2 Cans of beer per week     Social History   Substance and Sexual Activity  Drug Use Yes   Types: Marijuana, MDMA Banker)    Social History   Socioeconomic History   Marital status: Single    Spouse name: Not on file   Number of children: Not on file   Years of education: Not on file   Highest education level: Not on file  Occupational History   Not on file  Tobacco Use   Smoking status: Some Days    Packs/day: 0.25    Types: Cigarettes   Smokeless tobacco: Never  Vaping Use   Vaping Use: Never used  Substance and  Sexual Activity   Alcohol use: Yes    Alcohol/week: 2.0 standard drinks    Types: 2 Cans of beer per week   Drug use: Yes    Types: Marijuana, MDMA (Ecstacy)   Sexual activity: Yes  Other Topics Concern   Not on file  Social History Narrative   Not on file   Social Determinants of Health   Financial Resource Strain: Not on file  Food Insecurity: Not on file  Transportation Needs: Not on file  Physical Activity: Not on file  Stress: Not on file  Social Connections: Not on file   Additional Social History:      Sleep: Fair  Appetite:  Fair  Current Medications: Current Facility-Administered Medications  Medication Dose Route Frequency Provider Last Rate Last Admin   acetaminophen (TYLENOL) tablet 650 mg  650 mg Oral Q6H PRN Waldon Merl F, NP   650 mg at 06/22/21 1702   alum & mag hydroxide-simeth (MAALOX/MYLANTA) 200-200-20 MG/5ML suspension 30 mL  30 mL Oral Q4H PRN Waldon Merl F, NP       divalproex (DEPAKOTE) DR tablet 500 mg  500 mg Oral Q12H Waldon Merl F, NP   500 mg at 06/22/21 2003   FLUoxetine (PROZAC) capsule 20 mg  20 mg Oral Daily Waldon Merl F, NP   20 mg at 06/22/21 0856   lidocaine (LIDODERM) 5 % 1 patch  1 patch Transdermal Q24H Clapacs, Madie Reno, MD   1 patch  at 06/22/21 2101   magnesium hydroxide (MILK OF MAGNESIA) suspension 30 mL  30 mL Oral Daily PRN Waldon Merl F, NP       menthol-cetylpyridinium (CEPACOL) lozenge 3 mg  1 lozenge Oral PRN He, Jun, MD       nicotine (NICODERM CQ - dosed in mg/24 hours) patch 14 mg  14 mg Transdermal Daily Clapacs, Madie Reno, MD   14 mg at 06/22/21 0856   QUEtiapine (SEROQUEL) tablet 200 mg  200 mg Oral QHS Clapacs, John T, MD   200 mg at 06/22/21 2059   QUEtiapine (SEROQUEL) tablet 50 mg  50 mg Oral BID Waldon Merl F, NP   50 mg at 06/22/21 1702    Lab Results: No results found for this or any previous visit (from the past 48 hour(s)).  Blood Alcohol level:  Lab Results  Component Value Date    ETH <10 06/14/2021   ETH <10 123XX123    Metabolic Disorder Labs: Lab Results  Component Value Date   HGBA1C 5.6 06/17/2021   MPG 114 06/17/2021   MPG 114.02 10/02/2020   Lab Results  Component Value Date   PROLACTIN 33.2 (H) 08/15/2016   Lab Results  Component Value Date   CHOL 169 06/17/2021   TRIG 65 06/17/2021   HDL 45 06/17/2021   CHOLHDL 3.8 06/17/2021   VLDL 13 06/17/2021   LDLCALC 111 (H) 06/17/2021   LDLCALC 96 10/02/2020    Physical Findings: AIMS:  , ,  ,  ,    CIWA:    COWS:     Musculoskeletal: Strength & Muscle Tone: within normal limits Gait & Station: normal Patient leans: N/A  Psychiatric Specialty Exam: Physical Exam Vitals and nursing note reviewed.  Constitutional:      Appearance: Normal appearance.  HENT:     Head: Normocephalic and atraumatic.     Nose: Nose normal.  Pulmonary:     Effort: Pulmonary effort is normal.  Abdominal:     General: Abdomen is flat.  Musculoskeletal:        General: Normal range of motion.     Cervical back: Normal range of motion.  Neurological:     General: No focal deficit present.     Mental Status: He is alert and oriented to person, place, and time.  Psychiatric:        Attention and Perception: Attention normal.        Mood and Affect: Mood is anxious. Affect is blunt.        Speech: Speech normal.        Behavior: Behavior normal. Behavior is cooperative.        Thought Content: Thought content normal.        Cognition and Memory: Cognition and memory normal.        Judgment: Judgment normal.    Review of Systems  Constitutional: Negative.   HENT: Negative.    Eyes: Negative.   Respiratory: Negative.    Cardiovascular: Negative.   Gastrointestinal: Negative.   Musculoskeletal:  Positive for back pain.  Skin: Negative.   Neurological: Negative.   Psychiatric/Behavioral:  The patient is nervous/anxious.    Blood pressure 109/76, pulse 76, temperature 98.4 F (36.9 C), temperature  source Oral, resp. rate 18, height 6\' 3"  (1.905 m), weight 93.4 kg, SpO2 99 %.Body mass index is 25.75 kg/m.  General Appearance: Casual  Eye Contact:  Good  Speech:  Normal Rate  Volume:  Normal  Mood:  Anxious  Affect:  Congruent  Thought Process:  Coherent and Descriptions of Associations: Intact  Orientation:  Full (Time, Place, and Person)  Thought Content:  Logical  Suicidal Thoughts:  No  Homicidal Thoughts:  No  Memory:  Immediate;   Good Recent;   Good Remote;   Good  Judgement:  Fair  Insight:  Fair  Psychomotor Activity:  Normal  Concentration:  Concentration: Good and Attention Span: Good  Recall:  Good  Fund of Knowledge:  Good  Language:  Good  Akathisia:  No  Handed:  Right  AIMS (if indicated):     Assets:  Leisure Time Physical Health Resilience  ADL's:  Intact  Cognition:  WNL  Sleep:  Number of Hours: 8.25     Physical Exam: Physical Exam Vitals and nursing note reviewed.  Constitutional:      Appearance: Normal appearance.  HENT:     Head: Normocephalic and atraumatic.     Nose: Nose normal.  Pulmonary:     Effort: Pulmonary effort is normal.  Abdominal:     General: Abdomen is flat.  Musculoskeletal:        General: Normal range of motion.     Cervical back: Normal range of motion.  Neurological:     General: No focal deficit present.     Mental Status: He is alert and oriented to person, place, and time.  Psychiatric:        Attention and Perception: Attention normal.        Mood and Affect: Mood is anxious. Affect is blunt.        Speech: Speech normal.        Behavior: Behavior normal. Behavior is cooperative.        Thought Content: Thought content normal.        Cognition and Memory: Cognition and memory normal.        Judgment: Judgment normal.   Review of Systems  Constitutional: Negative.   HENT: Negative.    Eyes: Negative.   Respiratory: Negative.    Cardiovascular: Negative.   Gastrointestinal: Negative.    Musculoskeletal:  Positive for back pain.  Skin: Negative.   Neurological: Negative.   Psychiatric/Behavioral:  The patient is nervous/anxious.   Blood pressure 109/76, pulse 76, temperature 98.4 F (36.9 C), temperature source Oral, resp. rate 18, height 6\' 3"  (1.905 m), weight 93.4 kg, SpO2 99 %. Body mass index is 25.75 kg/m.   Treatment Plan Summary: Medication management and Plan increase Seroquel to 200 mg.  Probably a good idea to keep going up on that dosage.  Continue Prozac.  Encourage group attendance.  Encourage patient to work with treatment team and social work on possible discharge planning.  At the suggestion of nursing adding a an order for lidocaine patch for his back  Schizophrenia, paranoid type -- continue Seroquel 50mg  po BID and 200mg  qhs. (QTc is 375 on 04/17/21) -- continue Depakote DR 500mg  BID (VPA pending).  -- continue Prozac 20mg  daily.   -- working with SW team for disposition.   , NP 06/23/2021, 7:05 AM

## 2021-06-23 NOTE — Progress Notes (Signed)
I assumed care for Timothy [name] at about 07:30 from outgoing shift RN. Pt was resting in bed, later seen in the day area after breakfast. He denied any avh/hi/si,reports chronic back pain rated at 8 out of 10 for which he received prn Acetaminophen. He denied any avh/hi/si, denied any changes in mental or physical status, no behavioral problems thus far, minimal but appropriate interaction with peers. He is being monitored as ordered.

## 2021-06-23 NOTE — B[facility] Counselor (Addendum)
CSW met with pt briefly to discuss disposition. Pt stated that he had not called [facility] as of yet. Pt and CSW discussed [facility] in [place]. Pt stated that he plans to call [facility] first since that is closest to his mother's home. CSW asked that pt update him regarding the contact. No other concerns expressed. Contact ended without incident.   Chalmers Guest. Guerry Bruin, MSW, LCSW, [place] 06/23/2021 1:41 PM  ADDENDUM:  CSW spoke with pt regarding his contact with [facility]. Pt stated that they told him that they do not have any bed availability. CSW asked pt it he would like CSW to contact [facility]. He agreed. No other concerns expressed. Contact ended without incident.   CSW contacted [facility] and was informed that the community intake center [phone]) would need to be contacted for admission process. Contact ended without incident.   CSW attempted to contact the community intake center unsuccessfully. Phone just rang without end and never switched over to an answering service.   CSW will attempt to contact later.   Chalmers Guest. Guerry Bruin, MSW, [place], [place] 06/23/2021 2:03 PM

## 2021-06-24 NOTE — Progress Notes (Signed)
Patient has been pleasant and less isolative. Out of his room interacting with other patients.Denies SI, HI and AVH

## 2021-06-24 NOTE — Progress Notes (Addendum)
[facility]/BMU LCSW Progress Note   06/24/2021    12:40 PM  Timothy [name]   [id number]   Type of Contact and Topic:  Discharge Planning   CSW met with patient to discuss aftercare and housing resources. Patient called [facility] who reports that there is a two week waitlist.   Patient called Jorje Guild. housing criss line, for bed placement at [facility] and the [facility],  who reports that you must be a resident of the county to be placed at the shelter.   Patient refuses to apply for housing/SUD tx in [place] as he wants to remain close to his mother in [place].   [place] options currently operating a waitlist.   Patient requests to be discharge to his tent.     Signed:  Durenda Hurt, MSW, LCSWA, LCAS 06/24/2021 12:40 PM

## 2021-06-24 NOTE — Progress Notes (Signed)
Patient calm and cooperative during assessment denying SI/HI/AVH. Pt endorses depression. Pt presents with a flat affect. Pt isolated to his room tonight. Pt compliant with medication administration per MD orders. Pt given education, support, and encouragement to be active in his treatment plan. Pt being monitored Q 15 minutes for safety per unit protocol. Pt remains safe on the unit.

## 2021-06-24 NOTE — B[facility] Counselor (Addendum)
ADDENDUM CSW spoke with the patient again about [facility].  Pt again stated that he was agreeable.  CSW called to see if patient could have an interview.Interview is scheduled for 06/25/2021 at 9AM.  They report that there is NO availability for an interview today.  Penni Homans, MSW, LCSW 06/24/2021 2:53 PM   CSW spoke with the patient.  Patient reports that he has spoken with [facility].  He reports that they will not have a bed until two weeks from now.    CSW discussed with pt [facility]. He reports that he will give them a call.  CSW will follow up with the patient.   CSW did ask CSW team to assist patient with [facility], however, pt informed that he wanted to talk to [facility].  The call to [facility] reportedly never occurred.   Penni Homans, MSW, LCSW 06/24/2021 2:44 PM

## 2021-06-24 NOTE — Group Note (Signed)
LCSW Group Therapy Note   Group Date: 06/24/2021 Start Time: 1300 End Time: 1400   Type of Therapy and Topic:  Group Therapy: Boundaries  Participation Level:  Minimal  Description of Group: This group will address the use of boundaries in their personal lives. Patients will explore why boundaries are important, the difference between healthy and unhealthy boundaries, and negative and postive outcomes of different boundaries and will look at how boundaries can be crossed.  Patients will be encouraged to identify current boundaries in their own lives and identify what kind of boundary is being set. Facilitators will guide patients in utilizing problem-solving interventions to address and correct types boundaries being used and to address when no boundary is being used. Understanding and applying boundaries will be explored and addressed for obtaining and maintaining a balanced life. Patients will be encouraged to explore ways to assertively make their boundaries and needs known to significant others in their lives, using other group members and facilitator for role play, support, and feedback.  Therapeutic Goals:  1.  Patient will identify areas in their life where setting clear boundaries could be  used to improve their life.  2.  Patient will identify signs/triggers that a boundary is not being respected. 3.  Patient will identify two ways to set boundaries in order to achieve balance in  their lives: 4.  Patient will demonstrate ability to communicate their needs and set boundaries  through discussion and/or role plays  Summary of Patient Progress:    Patient was present for the entirety of group session. Patient participated in opening and closing remarks. However, patient did not contribute at all to the topic of discussion despite encouraged participation.   Therapeutic Modalities:   Cognitive Behavioral Therapy Solution-Focused Therapy  Almedia Balls 06/24/2021  3:05 PM

## 2021-06-24 NOTE — Progress Notes (Signed)
[facility] MD Progress Note  06/24/2021 3:24 PM Timothy [name]  MRN:  [id number] Subjective: Follow-up 25 year old man with schizophrenia or schizoaffective disorder.    Client is pleasant on assessment, smiling at times. He is working on getting into [place].  Moderate depression and anxiety, no hallucinations.  Sleep was better with the Relafen and Robaxin.  Appetite is "good."  Principal Problem: Schizoaffective disorder, bipolar type ([facility]) Diagnosis: Principal Problem:   Schizoaffective disorder, bipolar type ([facility]) Active Problems:   Homelessness  Total Time spent with patient: 30 minutes  Past Psychiatric History: Past history of schizoaffective disorder with problematic compliance and homelessness  Past Medical History:  Past Medical History:  Diagnosis Date   Cannabis abuse    Paranoid schizophrenia ([facility])    Schizoaffective disorder, depressive type ([facility]) 09/17/2014   History reviewed. No pertinent surgical history. Family History:  Family History  Problem Relation Age of Onset   Mental illness Brother    Drug abuse Brother    Mental illness Cousin    Suicidality Cousin    Diabetes Maternal Grandmother    Family Psychiatric  History: See previous Social History:  Social History   Substance and Sexual Activity  Alcohol Use Yes   Alcohol/week: 2.0 standard drinks   Types: 2 Cans of beer per week     Social History   Substance and Sexual Activity  Drug Use Yes   Types: Marijuana, MDMA Chiropodist)    Social History   Socioeconomic History   Marital status: Single    Spouse name: Not on file   Number of children: Not on file   Years of education: Not on file   Highest education level: Not on file  Occupational History   Not on file  Tobacco Use   Smoking status: Some Days    Packs/day: 0.25    Types: Cigarettes   Smokeless tobacco: Never  Vaping Use   Vaping Use: Never used  Substance and Sexual Activity   Alcohol use: Yes    Alcohol/week: 2.0 standard drinks     Types: 2 Cans of beer per week   Drug use: Yes    Types: Marijuana, MDMA (Ecstacy)   Sexual activity: Yes  Other Topics Concern   Not on file  Social History Narrative   Not on file   Social Determinants of Health   Financial Resource Strain: Not on file  Food Insecurity: Not on file  Transportation Needs: Not on file  Physical Activity: Not on file  Stress: Not on file  Social Connections: Not on file   Additional Social History:      Sleep: Fair  Appetite:  Fair  Current Medications: Current Facility-Administered Medications  Medication Dose Route Frequency Provider Last Rate Last Admin   alum & mag hydroxide-simeth (MAALOX/MYLANTA) 200-200-20 MG/5ML suspension 30 mL  30 mL Oral Q4H PRN Gabriel Cirri F, NP       divalproex (DEPAKOTE) DR tablet 500 mg  500 mg Oral Q12H Gabriel Cirri F, NP   500 mg at 06/24/21 0817   FLUoxetine (PROZAC) capsule 20 mg  20 mg Oral Daily Gabriel Cirri F, NP   20 mg at 06/24/21 0817   lidocaine (LIDODERM) 5 % 1 patch  1 patch Transdermal Q24H Clapacs, John T, MD   1 patch at 06/23/21 2113   magnesium hydroxide (MILK OF MAGNESIA) suspension 30 mL  30 mL Oral Daily PRN Gabriel Cirri F, NP       menthol-cetylpyridinium (CEPACOL) lozenge 3 mg  1  lozenge Oral PRN He, Jun, MD   3 mg at 06/24/21 0816   methocarbamol (ROBAXIN) tablet 500 mg  500 mg Oral BID Charm Rings, NP   500 mg at 06/24/21 6384   nabumetone (RELAFEN) tablet 500 mg  500 mg Oral BID Charm Rings, NP   500 mg at 06/24/21 6659   nicotine (NICODERM CQ - dosed in mg/24 hours) patch 14 mg  14 mg Transdermal Daily Clapacs, Jackquline [place], MD   14 mg at 06/24/21 0816   QUEtiapine (SEROQUEL) tablet 200 mg  200 mg Oral QHS Clapacs, John T, MD   200 mg at 06/23/21 2018   QUEtiapine (SEROQUEL) tablet 50 mg  50 mg Oral BID Gabriel Cirri F, NP   50 mg at 06/24/21 9357    Lab Results: No results found for this or any previous visit (from the past 48 hour(s)).  Blood Alcohol  level:  Lab Results  Component Value Date   ETH <10 06/14/2021   ETH <10 04/20/2021    Metabolic Disorder Labs: Lab Results  Component Value Date   HGBA1C 5.6 06/17/2021   MPG 114 06/17/2021   MPG 114.02 10/02/2020   Lab Results  Component Value Date   PROLACTIN 33.2 (H) 08/15/2016   Lab Results  Component Value Date   CHOL 169 06/17/2021   TRIG 65 06/17/2021   HDL 45 06/17/2021   CHOLHDL 3.8 06/17/2021   VLDL 13 06/17/2021   LDLCALC 111 (H) 06/17/2021   LDLCALC 96 10/02/2020     Musculoskeletal: Strength & Muscle Tone: within normal limits Gait & Station: normal Patient leans: N/A  Psychiatric Specialty Exam: Physical Exam Vitals and nursing note reviewed.  Constitutional:      Appearance: Normal appearance.  HENT:     Head: Normocephalic and atraumatic.     Nose: Nose normal.  Pulmonary:     Effort: Pulmonary effort is normal.  Abdominal:     General: Abdomen is flat.  Musculoskeletal:        General: Normal range of motion.     Cervical back: Normal range of motion.  Neurological:     General: No focal deficit present.     Mental Status: He is alert and oriented to person, place, and time.  Psychiatric:        Attention and Perception: Attention normal.        Mood and Affect: Mood is anxious and depressed. Affect is blunt.        Speech: Speech normal.        Behavior: Behavior normal. Behavior is cooperative.        Thought Content: Thought content normal.        Cognition and Memory: Cognition and memory normal.        Judgment: Judgment normal.    Review of Systems  Constitutional: Negative.   HENT: Negative.    Eyes: Negative.   Respiratory: Negative.    Cardiovascular: Negative.   Gastrointestinal: Negative.   Musculoskeletal:  Positive for back pain.  Skin: Negative.   Neurological: Negative.   Psychiatric/Behavioral:  The patient is nervous/anxious.    Blood pressure 127/70, pulse 71, temperature 98.1 F (36.7 C), temperature source  Oral, resp. rate 18, height 6\' 3"  (1.905 m), weight 93.4 kg, SpO2 100 %.Body mass index is 25.75 kg/m.  General Appearance: Casual  Eye Contact:  Good  Speech:  Normal Rate  Volume:  Normal  Mood:  Anxious  Affect:  Congruent  Thought Process:  Coherent and Descriptions of Associations: Intact  Orientation:  Full (Time, Place, and Person)  Thought Content:  Logical  Suicidal Thoughts:  No  Homicidal Thoughts:  No  Memory:  Immediate;   Good Recent;   Good Remote;   Good  Judgement:  Fair  Insight:  Fair  Psychomotor Activity:  Normal  Concentration:  Concentration: Good and Attention Span: Good  Recall:  Good  Fund of Knowledge:  Good  Language:  Good  Akathisia:  No  Handed:  Right  AIMS (if indicated):     Assets:  Leisure Time Physical Health Resilience  ADL's:  Intact  Cognition:  WNL  Sleep:  Number of Hours: 8.25     Physical Exam: Physical Exam Vitals and nursing note reviewed.  Constitutional:      Appearance: Normal appearance.  HENT:     Head: Normocephalic and atraumatic.     Nose: Nose normal.  Pulmonary:     Effort: Pulmonary effort is normal.  Abdominal:     General: Abdomen is flat.  Musculoskeletal:        General: Normal range of motion.     Cervical back: Normal range of motion.  Neurological:     General: No focal deficit present.     Mental Status: He is alert and oriented to person, place, and time.  Psychiatric:        Attention and Perception: Attention normal.        Mood and Affect: Mood is anxious and depressed. Affect is blunt.        Speech: Speech normal.        Behavior: Behavior normal. Behavior is cooperative.        Thought Content: Thought content normal.        Cognition and Memory: Cognition and memory normal.        Judgment: Judgment normal.   Review of Systems  Constitutional: Negative.   HENT: Negative.    Eyes: Negative.   Respiratory: Negative.    Cardiovascular: Negative.   Gastrointestinal: Negative.    Musculoskeletal:  Positive for back pain.  Skin: Negative.   Neurological: Negative.   Psychiatric/Behavioral:  The patient is nervous/anxious.   Blood pressure 127/70, pulse 71, temperature 98.1 F (36.7 C), temperature source Oral, resp. rate 18, height 6\' 3"  (1.905 m), weight 93.4 kg, SpO2 100 %. Body mass index is 25.75 kg/m.   Treatment Plan Summary: Medication management and Plan increase Seroquel to 200 mg.  Probably a good idea to keep going up on that dosage.  Continue Prozac.  Encourage group attendance.  Encourage patient to work with treatment team and social work on possible discharge planning.  At the suggestion of nursing adding a an order for lidocaine patch for his back  Schizophrenia, paranoid type -- continue Seroquel 50mg  po BID and 200mg  qhs. (QTc is 375 on 04/17/21) -- continue Depakote DR 500mg  BID (VPA pending).  -- continue Prozac 20mg  daily.   Back Pain: -Started Relafen 500 mg BId -Started Robaxin 500 mg BID  -- working with SW team for disposition.   , NP 06/24/2021, 3:24 PM

## 2021-06-24 NOTE — Plan of Care (Signed)
°  Problem: Education: Goal: Knowledge of [facility] Education information/materials will improve Outcome: Progressing Goal: Emotional status will improve Outcome: Progressing Goal: Mental status will improve Outcome: Progressing Goal: Verbalization of understanding the information provided will improve Outcome: Progressing   Problem: Health Behavior/Discharge Planning: Goal: Identification of resources available to assist in meeting health care needs will improve Outcome: Progressing Goal: Compliance with treatment plan for underlying cause of condition will improve Outcome: Progressing   Problem: Physical Regulation: Goal: Ability to maintain clinical measurements within normal limits will improve Outcome: Progressing   Problem: Safety: Goal: Periods of time without injury will increase Outcome: Progressing   Problem: Education: Goal: Utilization of techniques to improve thought processes will improve Outcome: Progressing Goal: Knowledge of the prescribed therapeutic regimen will improve Outcome: Progressing   Problem: Activity: Goal: Interest or engagement in leisure activities will improve Outcome: Progressing   Problem: Coping: Goal: Coping ability will improve Outcome: Progressing Goal: Will verbalize feelings Outcome: Progressing   Problem: Health Behavior/Discharge Planning: Goal: Ability to make decisions will improve Outcome: Progressing Goal: Compliance with therapeutic regimen will improve Outcome: Progressing   Problem: Role Relationship: Goal: Will demonstrate positive changes in social behaviors and relationships Outcome: Progressing   Problem: Safety: Goal: Ability to disclose and discuss suicidal ideas will improve Outcome: Progressing   Problem: Self-Concept: Goal: Will verbalize positive feelings about self Outcome: Progressing

## 2021-06-24 NOTE — Progress Notes (Signed)
Patient alert and oriented. Patient rates pain 8/10. Patient rates anxiety 5/10 and depression 8/10. Patient denies SI/HI/AVH. Patient compliant with medication. Patient became less isolative as the day continues. Patient seen in dayroom watching the television. Q15 minute safety checks maintained. Patient remains safe on the unit at this time.

## 2021-06-24 NOTE — Plan of Care (Signed)
  Problem: Education: Goal: Knowledge of [facility] Education information/materials will improve Outcome: Progressing Goal: Emotional status will improve Outcome: Progressing Goal: Mental status will improve Outcome: Progressing Goal: Verbalization of understanding the information provided will improve Outcome: Progressing   Problem: Activity: Goal: Interest or engagement in activities will improve Outcome: Progressing Goal: Sleeping patterns will improve Outcome: Progressing   Problem: Coping: Goal: Ability to verbalize frustrations and anger appropriately will improve Outcome: Progressing Goal: Ability to demonstrate self-control will improve Outcome: Progressing   Problem: Health Behavior/Discharge Planning: Goal: Identification of resources available to assist in meeting health care needs will improve Outcome: Progressing Goal: Compliance with treatment plan for underlying cause of condition will improve Outcome: Progressing   Problem: Physical Regulation: Goal: Ability to maintain clinical measurements within normal limits will improve Outcome: Progressing   Problem: Safety: Goal: Periods of time without injury will increase Outcome: Progressing   Problem: Education: Goal: Utilization of techniques to improve thought processes will improve Outcome: Progressing Goal: Knowledge of the prescribed therapeutic regimen will improve Outcome: Progressing   Problem: Activity: Goal: Interest or engagement in leisure activities will improve Outcome: Progressing Goal: Imbalance in normal sleep/wake cycle will improve Outcome: Progressing   Problem: Coping: Goal: Coping ability will improve Outcome: Progressing Goal: Will verbalize feelings Outcome: Progressing   Problem: Health Behavior/Discharge Planning: Goal: Ability to make decisions will improve Outcome: Progressing Goal: Compliance with therapeutic regimen will improve Outcome: Progressing    Problem: Role Relationship: Goal: Will demonstrate positive changes in social behaviors and relationships Outcome: Progressing   Problem: Safety: Goal: Ability to disclose and discuss suicidal ideas will improve Outcome: Progressing Goal: Ability to identify and utilize support systems that promote safety will improve Outcome: Progressing   Problem: Self-Concept: Goal: Will verbalize positive feelings about self Outcome: Progressing Goal: Level of anxiety will decrease Outcome: Progressing   

## 2021-06-24 NOTE — [facility] Counselor (Signed)
CSW attempted to meet with the patient to discuss aftercare, specifically [facility] and [employer] [facility].   Pt was asleep.    CSW will attempt again later.  Penni Homans, MSW, LCSW 06/24/2021 11:35 AM

## 2021-06-25 LAB — VALPROIC ACID LEVEL: Valproic Acid Lvl: 69 ug/mL (ref 50.0–100.0)

## 2021-06-25 NOTE — Progress Notes (Signed)
Patient alert and oriented. Patient isolative to room but comes out for meals and medication. Patient calm and cooperative. Patient compliant with medication administration. Patient denies SI/HI/AVH. Patient denies pain. Patient rates anxiety 6/10 and denies depression.  Q15 minute safety checks maintained. Patient remains safe on the unit at this time.

## 2021-06-25 NOTE — Progress Notes (Signed)
[facility] LCSW Progress Note   06/25/2021    3:52 PM  Timothy [name]   [id number]   Type of Contact and Topic:  Discharge Planning   CSW met with patient to inform him of availability of cold weather shelter in place from December 22 through December 26. Patient provided with additional shelter contact information to follow up proactively. No further action.     Signed:  Durenda Hurt, MSW, LCSWA, LCAS 06/25/2021 3:52 PM

## 2021-06-25 NOTE — Group Note (Signed)
[facility] LCSW Group Therapy Note   Group Date: 06/25/2021 Start Time: 1300 End Time: 1400   Type of Therapy/Topic:  Group Therapy:  Balance in Life  Participation Level:  Did Not Attend   Description of Group:    This group will address the concept of balance and how it feels and looks when one is unbalanced. Patients will be encouraged to process areas in their lives that are out of balance, and identify reasons for remaining unbalanced. Facilitators will guide patients utilizing problem- solving interventions to address and correct the stressor making their life unbalanced. Understanding and applying boundaries will be explored and addressed for obtaining  and maintaining a balanced life. Patients will be encouraged to explore ways to assertively make their unbalanced needs known to significant others in their lives, using other group members and facilitator for support and feedback.  Therapeutic Goals: Patient will identify two or more emotions or situations they have that consume much of in their lives. Patient will identify signs/triggers that life has become out of balance:  Patient will identify two ways to set boundaries in order to achieve balance in their lives:  Patient will demonstrate ability to communicate their needs through discussion and/or role plays  Summary of Patient Progress:    X    Therapeutic Modalities:   Cognitive Behavioral Therapy Solution-Focused Therapy Assertiveness Training   Harden Mo, LCSW

## 2021-06-25 NOTE — Progress Notes (Signed)
[facility] LCSW Progress Note   06/25/2021    9:26 AM  [name]   [id number]   Type of Contact and Topic:  Discharge Planning   CSW contacted [facility] who reports they are currently at capacity. No further action.     Signed:  Corky Crafts, MSW, LCSWA, LCAS 06/25/2021 9:26 AM

## 2021-06-25 NOTE — [facility] Counselor (Signed)
CSW met with the patient and provided him the contact information for [facility].  Patient completed the interview.  Patient reports that he was declined at [facility] due to "not using drugs, I told them I was withdrawing".  CSW discussed with the patient Forrest.  CSW called to check on bed availability, however at this time the facility is at capacity.  Assunta Curtis, MSW, LCSW 06/25/2021 9:10 AM

## 2021-06-25 NOTE — Progress Notes (Signed)
Recreation Therapy Notes  Date: 06/25/2021  Time: 10:30 am     Location:  Craft room    Behavioral response: N/A   Intervention Topic: Time Management   Discussion/Intervention: Patient did not attend group.  Clinical Observations/Feedback:  Patient did not attend group.   Lear Carstens LRT/CTRS        Monicia Tse 06/25/2021 11:33 AM

## 2021-06-25 NOTE — Progress Notes (Addendum)
[facility] MD Progress Note  06/25/2021 11:05 AM Timothy [name]  MRN:  [id number] Subjective: Follow-up 25 year old man with schizophrenia or schizoaffective disorder.    Client is depressed today after being refused admission to [facility] as he is not actively using drugs.  He is living in a tent and willing to go to a shelter or preferably a rescue mission where he could get assistance with finding employment.  Pleasant and cooperative.  Sleep and appetite are "fine", denies side effects from his medications.  Principal Problem: Schizoaffective disorder, bipolar type (HCC) Diagnosis: Principal Problem:   Schizoaffective disorder, bipolar type (HCC) Active Problems:   Homelessness  Total Time spent with patient: 30 minutes  Past Psychiatric History: Past history of schizoaffective disorder with problematic compliance and homelessness  Past Medical History:  Past Medical History:  Diagnosis Date   Cannabis abuse    Paranoid schizophrenia (HCC)    Schizoaffective disorder, depressive type (HCC) 09/17/2014   History reviewed. No pertinent surgical history. Family History:  Family History  Problem Relation Age of Onset   Mental illness Brother    Drug abuse Brother    Mental illness Cousin    Suicidality Cousin    Diabetes Maternal Grandmother    Family Psychiatric  History: See previous Social History:  Social History   Substance and Sexual Activity  Alcohol Use Yes   Alcohol/week: 2.0 standard drinks   Types: 2 Cans of beer per week     Social History   Substance and Sexual Activity  Drug Use Yes   Types: Marijuana, MDMA Chiropodist)    Social History   Socioeconomic History   Marital status: Single    Spouse name: Not on file   Number of children: Not on file   Years of education: Not on file   Highest education level: Not on file  Occupational History   Not on file  Tobacco Use   Smoking status: Some Days    Packs/day: 0.25    Types: Cigarettes   Smokeless tobacco:  Never  Vaping Use   Vaping Use: Never used  Substance and Sexual Activity   Alcohol use: Yes    Alcohol/week: 2.0 standard drinks    Types: 2 Cans of beer per week   Drug use: Yes    Types: Marijuana, MDMA (Ecstacy)   Sexual activity: Yes  Other Topics Concern   Not on file  Social History Narrative   Not on file   Social Determinants of Health   Financial Resource Strain: Not on file  Food Insecurity: Not on file  Transportation Needs: Not on file  Physical Activity: Not on file  Stress: Not on file  Social Connections: Not on file   Additional Social History:      Sleep: Fair  Appetite:  Fair  Current Medications: Current Facility-Administered Medications  Medication Dose Route Frequency Provider Last Rate Last Admin   alum & mag hydroxide-simeth (MAALOX/MYLANTA) 200-200-20 MG/5ML suspension 30 mL  30 mL Oral Q4H PRN Gabriel Cirri F, NP       divalproex (DEPAKOTE) DR tablet 500 mg  500 mg Oral Q12H Gabriel Cirri F, NP   500 mg at 06/25/21 0817   FLUoxetine (PROZAC) capsule 20 mg  20 mg Oral Daily Gabriel Cirri F, NP   20 mg at 06/25/21 0817   lidocaine (LIDODERM) 5 % 1 patch  1 patch Transdermal Q24H Clapacs, Jackquline Denmark, MD   1 patch at 06/24/21 2110   magnesium hydroxide (MILK OF MAGNESIA) suspension  30 mL  30 mL Oral Daily PRN Gabriel Cirri F, NP       menthol-cetylpyridinium (CEPACOL) lozenge 3 mg  1 lozenge Oral PRN He, Jun, MD   3 mg at 06/24/21 0816   methocarbamol (ROBAXIN) tablet 500 mg  500 mg Oral BID Charm Rings, NP   500 mg at 06/25/21 6010   nabumetone (RELAFEN) tablet 500 mg  500 mg Oral BID Charm Rings, NP   500 mg at 06/25/21 9323   nicotine (NICODERM CQ - dosed in mg/24 hours) patch 14 mg  14 mg Transdermal Daily Clapacs, Jackquline Denmark, MD   14 mg at 06/25/21 5573   QUEtiapine (SEROQUEL) tablet 200 mg  200 mg Oral QHS Clapacs, John T, MD   200 mg at 06/24/21 2109   QUEtiapine (SEROQUEL) tablet 50 mg  50 mg Oral BID Gabriel Cirri F, NP   50 mg  at 06/25/21 2202    Lab Results: No results found for this or any previous visit (from the past 48 hour(s)).  Blood Alcohol level:  Lab Results  Component Value Date   ETH <10 06/14/2021   ETH <10 04/20/2021    Metabolic Disorder Labs: Lab Results  Component Value Date   HGBA1C 5.6 06/17/2021   MPG 114 06/17/2021   MPG 114.02 10/02/2020   Lab Results  Component Value Date   PROLACTIN 33.2 (H) 08/15/2016   Lab Results  Component Value Date   CHOL 169 06/17/2021   TRIG 65 06/17/2021   HDL 45 06/17/2021   CHOLHDL 3.8 06/17/2021   VLDL 13 06/17/2021   LDLCALC 111 (H) 06/17/2021   LDLCALC 96 10/02/2020     Musculoskeletal: Strength & Muscle Tone: within normal limits Gait & Station: normal Patient leans: N/A  Psychiatric Specialty Exam: Physical Exam Vitals and nursing note reviewed.  Constitutional:      Appearance: Normal appearance.  HENT:     Head: Normocephalic and atraumatic.     Nose: Nose normal.  Pulmonary:     Effort: Pulmonary effort is normal.  Abdominal:     General: Abdomen is flat.  Musculoskeletal:        General: Normal range of motion.     Cervical back: Normal range of motion.  Neurological:     General: No focal deficit present.     Mental Status: He is alert and oriented to person, place, and time.  Psychiatric:        Attention and Perception: Attention normal.        Mood and Affect: Mood is anxious and depressed. Affect is blunt.        Speech: Speech normal.        Behavior: Behavior normal. Behavior is cooperative.        Thought Content: Thought content normal.        Cognition and Memory: Cognition and memory normal.        Judgment: Judgment normal.    Review of Systems  Constitutional: Negative.   HENT: Negative.    Eyes: Negative.   Respiratory: Negative.    Cardiovascular: Negative.   Gastrointestinal: Negative.   Musculoskeletal:  Positive for back pain.  Skin: Negative.   Neurological: Negative.    Psychiatric/Behavioral:  The patient is nervous/anxious.    Blood pressure 110/70, pulse 78, temperature 98.5 F (36.9 C), temperature source Oral, resp. rate 18, height 6\' 3"  (1.905 m), weight 93.4 kg, SpO2 98 %.Body mass index is 25.75 kg/m.  General Appearance: Casual  Eye Contact:  Good  Speech:  Normal Rate  Volume:  Normal  Mood:  Anxious  Affect:  Congruent  Thought Process:  Coherent and Descriptions of Associations: Intact  Orientation:  Full (Time, Place, and Person)  Thought Content:  Logical  Suicidal Thoughts:  No  Homicidal Thoughts:  No  Memory:  Immediate;   Good Recent;   Good Remote;   Good  Judgement:  Fair  Insight:  Fair  Psychomotor Activity:  Normal  Concentration:  Concentration: Good and Attention Span: Good  Recall:  Good  Fund of Knowledge:  Good  Language:  Good  Akathisia:  No  Handed:  Right  AIMS (if indicated):     Assets:  Leisure Time Physical Health Resilience  ADL's:  Intact  Cognition:  WNL  Sleep:  Number of Hours: 8.25     Physical Exam: Physical Exam Vitals and nursing note reviewed.  Constitutional:      Appearance: Normal appearance.  HENT:     Head: Normocephalic and atraumatic.     Nose: Nose normal.  Pulmonary:     Effort: Pulmonary effort is normal.  Abdominal:     General: Abdomen is flat.  Musculoskeletal:        General: Normal range of motion.     Cervical back: Normal range of motion.  Neurological:     General: No focal deficit present.     Mental Status: He is alert and oriented to person, place, and time.  Psychiatric:        Attention and Perception: Attention normal.        Mood and Affect: Mood is anxious and depressed. Affect is blunt.        Speech: Speech normal.        Behavior: Behavior normal. Behavior is cooperative.        Thought Content: Thought content normal.        Cognition and Memory: Cognition and memory normal.        Judgment: Judgment normal.   Review of Systems   Constitutional: Negative.   HENT: Negative.    Eyes: Negative.   Respiratory: Negative.    Cardiovascular: Negative.   Gastrointestinal: Negative.   Musculoskeletal:  Positive for back pain.  Skin: Negative.   Neurological: Negative.   Psychiatric/Behavioral:  The patient is nervous/anxious.   Blood pressure 110/70, pulse 78, temperature 98.5 F (36.9 C), temperature source Oral, resp. rate 18, height 6\' 3"  (1.905 m), weight 93.4 kg, SpO2 98 %. Body mass index is 25.75 kg/m.   Treatment Plan Summary:  Schizophrenia, paranoid type -- continue Seroquel 50mg  po BID and 200mg  qhs. (QTc is 375 on 04/17/21) -- continue Depakote DR 500mg  BID (VPA ordered).  -- continue Prozac 20mg  daily.   Back Pain: -Continued Relafen 500 mg BId -Continued Robaxin 500 mg BID -Daily lidocaine patches  -- working with SW team for disposition.   , NP 06/25/2021, 11:05 AM

## 2021-06-26 DIAGNOSIS — F259 Schizoaffective disorder, unspecified: Secondary | ICD-10-CM | POA: Diagnosis present

## 2021-06-26 MED ORDER — FLUOXETINE HCL 20 MG PO CAPS: 20.0000 mg | ORAL_CAPSULE | Freq: Every day | ORAL | 1 refills | Status: DC

## 2021-06-26 MED ORDER — QUETIAPINE FUMARATE 200 MG PO TABS: 200.0000 mg | ORAL_TABLET | Freq: Every day | ORAL | 0 refills | Status: DC

## 2021-06-26 MED ORDER — LIDOCAINE 5 % EX PTCH: 1.0000 | MEDICATED_PATCH | CUTANEOUS | 0 refills | Status: AC

## 2021-06-26 MED ORDER — QUETIAPINE FUMARATE 50 MG PO TABS: 50.0000 mg | ORAL_TABLET | Freq: Two times a day (BID) | ORAL | 0 refills | Status: DC

## 2021-06-26 MED ORDER — NABUMETONE 500 MG PO TABS: 500.0000 mg | ORAL_TABLET | Freq: Two times a day (BID) | ORAL | 0 refills | Status: AC

## 2021-06-26 NOTE — [facility] Suicide Risk Assessment (Signed)
[facility] Discharge Suicide Risk Assessment   Principal Problem: Schizoaffective disorder, bipolar type (HCC) Discharge Diagnoses: Principal Problem:   Schizoaffective disorder, bipolar type (HCC) Active Problems:   Homelessness   Total Time spent with patient: 30 minutes  Suicide Risk:  Mild:  Suicidal ideation of limited frequency, intensity, duration, and specificity.  There are no identifiable plans, no associated intent, mild dysphoria and related symptoms, good self-control (both objective and subjective assessment), few other risk factors, and identifiable protective factors, including available and accessible social support.    Plan Of Care/Follow-up recommendations:  Follow-up with outpatient [facility] clinic  Thalia Party, MD 06/26/2021, 10:39 AM

## 2021-06-26 NOTE — Plan of Care (Signed)
°  Problem: Education: Goal: Emotional status will improve Outcome: Progressing   Problem: Activity: Goal: Interest or engagement in activities will improve Outcome: Not Progressing   Problem: Coping: Goal: Ability to demonstrate self-control will improve Outcome: Progressing   Problem: Safety: Goal: Periods of time without injury will increase Outcome: Progressing

## 2021-06-26 NOTE — Progress Notes (Signed)
Recreation Therapy Notes  INPATIENT RECREATION TR PLAN  Patient Details Name: LIEF PALMATIER MRN: [id number] DOB: 1996-01-19 Today's Date: 06/26/2021  Rec Therapy Plan Is patient appropriate for Therapeutic Recreation?: Yes Treatment times per week: at least 3 Estimated Length of Stay: 5-7 days TR Treatment/Interventions: Group participation (Comment)  Discharge Criteria Pt will be discharged from therapy if:: Discharged Treatment plan/goals/alternatives discussed and agreed upon by:: Patient/family  Discharge Summary Short term goals set: Patient will engage in groups without prompting or encouragement from LRT x3 group sessions within 5 recreation therapy group sessions Short term goals met: Adequate for discharge Progress toward goals comments: Groups attended Which groups?: Other (Comment), Stress management (Self-care) Reason goals not met: N/A Therapeutic equipment acquired: N/A Reason patient discharged from therapy: Discharge from hospital Pt/family agrees with progress & goals achieved: Yes Date patient discharged from therapy: 06/26/21   Glenisha Gundry 06/26/2021, 12:20 PM

## 2021-06-26 NOTE — Progress Notes (Signed)
Patient was provided with discharge summary, Transition packet, and Suicide Risk Assessment. Verbalized discharge summary, transition packet and suicide risk assessment, patient made aware of any change in medications, and all upcoming appointments.   Patient belongings returned.   Patient denied any SI, denies plans for self harm or the harm of others. Patient is calm, with appropriate affect and eye contact. Patient reports no complaints at this time.   

## 2021-06-26 NOTE — Discharge Summary (Signed)
Physician Discharge Summary Note  Patient:  Timothy [name] is an 25 y.o., male MRN:  [id number] DOB:  1995/08/09 Patient phone:  [phone] (home)  Patient address:   [address] [place] [address],  Total Time spent with patient: 30 minutes  Date of Admission:  06/15/2021 Date of Discharge: 06/26/2021  Reason for Admission:  psychosis  Principal Problem: Schizoaffective disorder, bipolar type [facility]) Discharge Diagnoses: Principal Problem:   Schizoaffective disorder, bipolar type (HCC) Active Problems:   Homelessness   Past Psychiatric History:  Past history of schizoaffective disorder with problematic compliance and homelessness  Past Medical History:  Past Medical History:  Diagnosis Date   Cannabis abuse    Paranoid schizophrenia (HCC)    Schizoaffective disorder, depressive type (HCC) 09/17/2014   History reviewed. No pertinent surgical history. Family History:  Family History  Problem Relation Age of Onset   Mental illness Brother    Drug abuse Brother    Mental illness Cousin    Suicidality Cousin    Diabetes Maternal Grandmother    Family Psychiatric  History: unknown Social History:  Social History   Substance and Sexual Activity  Alcohol Use Yes   Alcohol/week: 2.0 standard drinks   Types: 2 Cans of beer per week     Social History   Substance and Sexual Activity  Drug Use Yes   Types: Marijuana, MDMA Chiropodist)    Social History   Socioeconomic History   Marital status: Single    Spouse name: Not on file   Number of children: Not on file   Years of education: Not on file   Highest education level: Not on file  Occupational History   Not on file  Tobacco Use   Smoking status: Some Days    Packs/day: 0.25    Types: Cigarettes   Smokeless tobacco: Never  Vaping Use   Vaping Use: Never used  Substance and Sexual Activity   Alcohol use: Yes    Alcohol/week: 2.0 standard drinks    Types: 2 Cans of beer per week   Drug use: Yes     Types: Marijuana, MDMA (Ecstacy)   Sexual activity: Yes  Other Topics Concern   Not on file  Social History Narrative   Not on file   Social Determinants of Health   Financial Resource Strain: Not on file  Food Insecurity: Not on file  Transportation Needs: Not on file  Physical Activity: Not on file  Stress: Not on file  Social Connections: Not on file    Hospital Course:   The patient was admitted to Adult Psychiatry on a voluntary basis. Patient is a 25 year old man with a history of chronic mental illness brought himself voluntarily to the emergency room complaining of being depressed.  Patient tells me that he has been depressed recently and that he lives in a tent out in the woods.  He has been getting colder and he felt hopeless.  He presents his reasons for coming to the hospital as both his bad mood but also that he wanted shelter and has been unable to care for himself.  Patient is not a very forthcoming historian.  Makes no eye contact during the conversation.  Often mutters or eludes to things he is paranoid about that he will not elaborate on in detail.  Talks about how people are stealing his money or cheating him but will not answer any specific questions about it.  Has not been on any medication probably since his last hospitalization.  Patient denies that he has been drinking or using any drugs recently.  Says that he is completely estranged from his family now and has had no contact with his mother or uncle.  Denies active suicidal intent.  Expresses hopelessness.  Passively agreed to medication but says he thought it would be of no benefit.   He was restricted to ward and placed on suicide precautions. Patient was introduced to milieu activities and encouraged to participate in psycho-social groups. The plan at the time of admission was safety, stabilization and treatment.  For the management of mental illness, patient was restarted on medications: Divalproex 500mg  PO twice  daily for mood stabilization, Fluoxetine 20mg  PO daily for depression,  Quetiapine 50mg  twice daily and 200mg  at bedtime for psychosis.  Patient never required as needed medications for agitation or psychosis. He participated in groups and socialized with few peers. He never required seclusion or restraints. His initial symptoms of depression and psychosis had improved during the course of hospitalization.  He reports feeling much better, reports being in good mood, denies any thoughts of harming self, denies thoughts of harming others, denies hallucinations and paranoia. Denies any physical complaints. He reports he can contract for safety if discharged to shelter.   On the day of discharge 06/26/21, the patient was considered an acute LOW risk of self harm. Patients current age represents non-modifiable/baseline risk factors. Presence of mental disorder is dynamic risk factor. The patient denies suicidal thoughts, denies a history of intentional suicidal attempts, denies access to firearm. Patient is future-oriented, help-seeking, has access to mental health care, has community support, has cultural/religious beliefs that discourage suicide - all protective factors. Therefore, represents a low risk for harming self acutely and elevated chronic risk due to non-modifiable risk factors.  [place] Suicide Severity Rating Scale Wish to be dead: No Suicidal thoughts: No                 Suicidal thoughts with method: No                 Suicidal intent: No                 Suicide intent with specific plan: No                 Suicide behavior: No   Physical Findings: AIMS:  , ,  ,  ,    CIWA:    COWS:      Psychiatric Specialty Exam:  Appearance:  AAM, appearing stated age, appears well-nourished;  wearing appropriate to situation clothes, with fair grooming and hygiene. Normal level of alertness and appropriate facial expression.  Attitude/Behavior: calm, cooperative, polite, thankful, engaging with  appropriate eye contact.  Motor: WNL; dyskinesias not evident. Gait appears in full range.  Speech: spontaneous, clear, coherent, normal comprehension.  Mood: euthymic, " good ".  Affect: appropriately-reactive, restricted.  Thought process: patient appears coherent, organized, logical, goal-directed.  Thought content: patient denies suicidal thoughts, denies homicidal thoughts; did not express any delusions.  Thought perception: patient denies auditory and visual hallucinations, no illusions, no depersonalizations. Did not appear internally stimulated.  Cognition: patient is alert and oriented in self, place, date.  Insight: good, in regards of understanding of presence, nature, cause, and significance of mental or emotional problem.  Judgement: good, in regards of ability to make good decisions concerning the appropriate thing to do in various situations, including ability to form opinions regarding their mental health condition.    Physical Exam:  Physical Exam ROS Blood pressure 126/77, pulse 70, temperature 98.1 F (36.7 C), temperature source Oral, resp. rate 17, height 6\' 3"  (1.905 m), weight 93.4 kg, SpO2 100 %. Body mass index is 25.75 kg/m.   Social History   Tobacco Use  Smoking Status Some Days   Packs/day: 0.25   Types: Cigarettes  Smokeless Tobacco Never   Tobacco Cessation:  A prescription for an FDA-approved tobacco cessation medication was offered at discharge and the patient refused   Blood Alcohol level:  Lab Results  Component Value Date   [facility] <10 06/14/2021   ETH <10 [id number]    Metabolic Disorder Labs:  Lab Results  Component Value Date   HGBA1C 5.6 06/17/2021   MPG 114 06/17/2021   MPG 114.02 10/02/2020   Lab Results  Component Value Date   PROLACTIN 33.2 (H) 08/15/2016   Lab Results  Component Value Date   CHOL 169 06/17/2021   TRIG 65 06/17/2021   HDL 45 06/17/2021   CHOLHDL 3.8 06/17/2021   VLDL 13 06/17/2021   LDLCALC 111  (H) 06/17/2021   [place] 96 10/02/2020    See Psychiatric Specialty Exam and Suicide Risk Assessment completed by Attending Physician prior to discharge.  Discharge destination:  Other:  shelter  Is patient on multiple antipsychotic therapies at discharge:  No   Has Patient had three or more failed trials of antipsychotic monotherapy by history:  No  Recommended Plan for Multiple Antipsychotic Therapies: NA   Allergies as of 06/26/2021       Reactions   Haldol [haloperidol Lactate] Other (See Comments)   Muscle stiffness   Penicillins Hives   Felt like throat was closing Has patient had a PCN reaction causing immediate rash, facial/tongue/throat swelling, SOB or lightheadedness with hypotension: Unknown Has patient had a PCN reaction causing severe rash involving mucus membranes or skin necrosis: unknown Has patient had a PCN reaction that required hospitalization Unknown Has patient had a PCN reaction occurring within the last 10 years: Unknown If all of the above answers are "NO", then may proceed with Cephalosporin use.   Zyprexa [olanzapine]    eps        Medication List     STOP taking these medications    prazosin 1 MG capsule Commonly known as: MINIPRESS   traZODone 50 MG tablet Commonly known as: DESYREL       TAKE these medications      Indication  divalproex 500 MG DR tablet Commonly known as: DEPAKOTE Take 1 tablet (500 mg total) by mouth every 12 (twelve) hours.  Indication: Schizophrenia   FLUoxetine 20 MG capsule Commonly known as: PROZAC Take 1 capsule (20 mg total) by mouth daily. Start taking on: June 27, 2021 What changed:  medication strength how much to take  Indication: Depression   lidocaine 5 % Commonly known as: LIDODERM Place 1 patch onto the skin daily for 10 days. Remove & Discard patch within 12 hours or as directed by MD  Indication: Allodynia   nabumetone 500 MG tablet Commonly known as: RELAFEN Take 1 tablet  (500 mg total) by mouth 2 (two) times daily for 14 days.  Indication: Rheumatoid Arthritis   QUEtiapine 50 MG tablet Commonly known as: SEROQUEL Take 1 tablet (50 mg total) by mouth 2 (two) times daily. What changed: Another medication with the same name was changed. Make sure you understand how and when to take each.  Indication: Schizophrenia   QUEtiapine 200 MG tablet Commonly known as: SEROQUEL  Take 1 tablet (200 mg total) by mouth at bedtime. What changed:  medication strength how much to take  Indication: Schizophrenia         Follow-up recommendations:  Other:  follow-up with outpatient [facility]  provider.  Comments:   Emergency Contact Plan: Patient understands to call Suicide Hotline at 3, or Mobile Crisis at [phone], call 911, or go to the nearest ER as appropriate in case of thoughts of harm to self or others, or acute onset of intolerable side effects.   Signed: Larita Fife, MD 06/26/2021, 10:40 AM

## 2021-06-26 NOTE — Plan of Care (Signed)
Met with pt in pt room. Pt is in bed. Pt is minimal with assessment. Pt denies SI / HI / AVH. Patient denies any issues with mood. Affect is appropriate. Pt is pleasant. No signs of distress or injury. Staff will continue to monitor per protocol.    Problem: Education: Goal: Knowledge of [place] [facility] Education information/materials will improve Outcome: Not Progressing Goal: Emotional status will improve Outcome: Not Progressing Goal: Mental status will improve Outcome: Not Progressing

## 2021-06-26 NOTE — Progress Notes (Signed)
Recreation Therapy Notes    Date: 06/26/2021  Time: 10:00 am    Location: Craft room    Behavioral response: Appropriate   Intervention Topic: Self-care   Discussion/Intervention:  Group content today was focused on Self-Care. The group defined self-care and some positive ways they care for themselves. Individuals expressed ways and reasons why they neglected any self-care in the past. Patients described ways to improve self-care in the future. The group explained what could happen if they did not do any self-care activities at all. The group participated in the intervention self-care assessment where they had a chance to discover some of their weaknesses and strengths in self- care. Patient came up with a self-care plan to improve themselves in the future.   Clinical Observations/Feedback: Patient came to group and stated that he participates in self-care by getting check-ups. Individual was social with peers and staff while participating in the intervention.    Ernesto Lashway LRT/CTRS         Marce Schartz 06/26/2021 12:15 PM

## 2021-06-26 NOTE — Progress Notes (Signed)
°  [facility] Adult Case Management Discharge Plan :  Will you be returning to the same living situation after discharge:  No. Patient to discharge to cold weather shelter.   At discharge, do you have transportation home?: Yes,  CSW to assist with transportation. Patient has signed the [facility] transportation waiver which was emailed to transportation office.   Do you have the ability to pay for your medications: No. No insurance noted.   Release of information consent forms completed and in the chart;  Patient's signature needed at discharge.  Patient to Follow up at:  Follow-up Information     [facility]. Go to.   Specialty: Behavioral Health Why: Please present for walk in hours at 0730 Mon - Wed. Clinic will start seeing patients at 0800 on a first come first serve. Contact information: [phone] [place] [address] [phone]                Next level of care provider has access to [facility] Link:yes  Safety Planning and Suicide Prevention discussed: Yes,  SPE completed with patient, declined consent for CSW to reach collateral.    Has patient been referred to the Quitline?: Patient refused referral  Patient has been referred for addiction treatment: Yes  Corky Crafts, LCSWA 06/26/2021, 11:36 AM

## 2021-06-26 NOTE — Final Progress Note (Signed)
Isolative to self and room. No interaction with peers. Meds taken to patient. Came out for snack. Denies Si, Hi, AVH. Encouragement and support provided. Safety checks maintained. Medications given as prescribed. Pt receptive and remains safe on unit with q 15 min checks.

## 2021-06-26 NOTE — Progress Notes (Signed)
While preparing Pt for discharge, belonging were obtained to charge pt cell phone per request. While opening Pt belonging backpack, a wooden handled hatchet was discovered. Hatchet was within eye sight on open pt backpack. Security was present. Security supervisor contacted and hatchet was put in security safe upstairs. Reported incident to charge nurse.

## 2021-06-30 ENCOUNTER — Emergency Department (HOSPITAL_COMMUNITY)
Admission: EM | Admit: 2021-06-30 | Discharge: 2021-07-02 | Disposition: A | Discharge status: Nursing Home (Includes ICF) | Payer: Self-pay | Attending: Emergency Medicine | Admitting: Emergency Medicine

## 2021-06-30 DIAGNOSIS — Z79899 Other long term (current) drug therapy: Secondary | ICD-10-CM | POA: Insufficient documentation

## 2021-06-30 DIAGNOSIS — F17210 Nicotine dependence, cigarettes, uncomplicated: Secondary | ICD-10-CM | POA: Insufficient documentation

## 2021-06-30 DIAGNOSIS — F29 Unspecified psychosis not due to a substance or known physiological condition: Secondary | ICD-10-CM | POA: Insufficient documentation

## 2021-06-30 DIAGNOSIS — Z20822 Contact with and (suspected) exposure to covid-19: Secondary | ICD-10-CM | POA: Insufficient documentation

## 2021-06-30 DIAGNOSIS — F259 Schizoaffective disorder, unspecified: Secondary | ICD-10-CM | POA: Insufficient documentation

## 2021-06-30 DIAGNOSIS — F99 Mental disorder, not otherwise specified: Secondary | ICD-10-CM

## 2021-06-30 LAB — COMPREHENSIVE METABOLIC PANEL
ALT: 28 U/L (ref 0–44)
AST: 40 U/L (ref 15–41)
Albumin: 4 g/dL (ref 3.5–5.0)
Alkaline Phosphatase: 41 U/L (ref 38–126)
Anion gap: 16 — ABNORMAL HIGH (ref 5–15)
BUN: 19 mg/dL (ref 6–20)
CO2: 20 mmol/L — ABNORMAL LOW (ref 22–32)
Calcium: 9.1 mg/dL (ref 8.9–10.3)
Chloride: 104 mmol/L (ref 98–111)
Creatinine, Ser: 1.43 mg/dL — ABNORMAL HIGH (ref 0.61–1.24)
GFR, Estimated: >60 mL/min (ref >60)
Glucose, Bld: 76 mg/dL (ref 70–99)
Potassium: 3.6 mmol/L (ref 3.5–5.1)
Sodium: 140 mmol/L (ref 135–145)
Total Bilirubin: 1.2 mg/dL (ref 0.3–1.2)
Total Protein: 7.2 g/dL (ref 6.5–8.1)

## 2021-06-30 LAB — CBC WITH DIFFERENTIAL/PLATELET
Abs Immature Granulocytes: 0.06 10*3/uL (ref 0.00–0.07)
Basophils Absolute: 0 10*3/uL (ref 0.0–0.1)
Basophils Relative: 0 %
Eosinophils Absolute: 0 10*3/uL (ref 0.0–0.5)
Eosinophils Relative: 0 %
HCT: 43.5 % (ref 39.0–52.0)
Hemoglobin: 14.2 g/dL (ref 13.0–17.0)
Immature Granulocytes: 1 %
Lymphocytes Relative: 18 %
Lymphs Abs: 1.7 10*3/uL (ref 0.7–4.0)
MCH: 30.5 pg (ref 26.0–34.0)
MCHC: 32.6 g/dL (ref 30.0–36.0)
MCV: 93.3 fL (ref 80.0–100.0)
Monocytes Absolute: 1.5 10*3/uL — ABNORMAL HIGH (ref 0.1–1.0)
Monocytes Relative: 15 %
Neutro Abs: 6.5 10*3/uL (ref 1.7–7.7)
Neutrophils Relative %: 66 %
Platelets: 209 10*3/uL (ref 150–400)
RBC: 4.66 MIL/uL (ref 4.22–5.81)
RDW: 13.2 % (ref 11.5–15.5)
WBC: 9.8 10*3/uL (ref 4.0–10.5)
nRBC: 0 % (ref 0.0–0.2)

## 2021-06-30 LAB — RESP PANEL BY RT-PCR (FLU A&B, COVID) ARPGX2
Influenza A by PCR: NEGATIVE
Influenza B by PCR: NEGATIVE
SARS Coronavirus 2 by RT PCR: NEGATIVE

## 2021-06-30 LAB — ETHANOL: Alcohol, Ethyl (B): <10 mg/dL (ref <10)

## 2021-06-30 NOTE — ED Notes (Signed)
Belongings placed in locker #5 

## 2021-06-30 NOTE — ED Notes (Signed)
Pt restrained PTA, did not require them here, pt compliant and resting on ETCO2 monitoring, O2 monitoring, and cardiac monitor.

## 2021-06-30 NOTE — ED Provider Notes (Signed)
[facility] EMERGENCY DEPARTMENT Provider Note   CSN: [id number] Arrival date & time: 06/30/21  1936     History Chief Complaint  Patient presents with   Psychiatric Evaluation    Timothy [name] is a 25 y.o. male with history of schizoaffective disorder and homelessness brought in by [facility] for psychiatric evaluation.  Patient was reportedly running into traffic and yelling obscenities.  He was noted to have hatchet on his person, and this was removed.  He was restrained prior to arrival, and EMS given Haldol and Ativan.  He has been sleeping since arrival to the emergency department.  Level 5 caveat due to psychiatric disorder.   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     Family History  Problem Relation Age of Onset   Mental illness Brother    Drug abuse Brother    Mental illness Cousin    Suicidality Cousin    Diabetes Maternal Grandmother     Social History   Tobacco Use   Smoking status: Some Days    Packs/day: 0.25    Types: Cigarettes   Smokeless tobacco: Never  Vaping Use   Vaping Use: Never used  Substance Use Topics   Alcohol use: Yes    Alcohol/week: 2.0 standard drinks    Types: 2 Cans of beer per week   Drug use: Yes    Types: Marijuana, MDMA (Ecstacy)    Home Medications Prior to Admission medications   Medication Sig Start Date End Date  Taking? Authorizing Provider  divalproex (DEPAKOTE) 500 MG DR tablet Take 1 tablet (500 mg total) by mouth every 12 (twelve) hours. 11/06/20   Clapacs, Jackquline Denmark, MD  FLUoxetine (PROZAC) 20 MG capsule Take 1 capsule (20 mg total) by mouth daily. 06/27/21 07/27/21  Thalia Party, MD  lidocaine (LIDODERM) 5 % Place 1 patch onto the skin daily for 10 days. Remove & Discard patch within 12 hours or as directed by MD 06/26/21 07/06/21  Thalia Party, MD  nabumetone (RELAFEN) 500 MG tablet Take 1 tablet (500 mg total) by mouth 2 (two) times daily for 14 days. 06/26/21 07/10/21  Thalia Party, MD  QUEtiapine (SEROQUEL) 200 MG tablet Take 1 tablet (200 mg total) by mouth at bedtime. 06/26/21 07/26/21  Thalia Party, MD  QUEtiapine (SEROQUEL) 50 MG tablet Take 1 tablet (50 mg total) by mouth 2 (two) times daily. 06/26/21 07/26/21  Thalia Party, MD    Allergies    Haldol [haloperidol lactate], Penicillins, and Zyprexa [olanzapine]  Review of Systems   Review of Systems  Unable to perform ROS: Psychiatric disorder   Physical Exam Updated Vital Signs BP (!) 163/150 (BP Location: Right Arm)    Pulse 63    Temp (!) 97.3 F (36.3 C) (Oral) Comment: Pt cold and wet upon arrival. Warm blankets applied   Resp 15    SpO2 100%   Physical Exam Vitals and nursing note reviewed.  Constitutional:      Appearance: Normal appearance.     Comments: Patient is sleeping, alert to verbal stimuli but does not keep eyes open or answer questions  HENT:     Head: Normocephalic and atraumatic.  Eyes:     Conjunctiva/sclera: Conjunctivae normal.  Pulmonary:     Effort: Pulmonary effort is normal. No respiratory distress.  Skin:    General: Skin is warm and dry.  Neurological:     Mental Status: He is alert.  Psychiatric:        Mood and Affect: Mood normal.        Behavior: Behavior normal.    ED Results / Procedures / Treatments   Labs (all labs ordered are listed, but only abnormal results are displayed) Labs Reviewed   COMPREHENSIVE METABOLIC PANEL - Abnormal; Notable for the following components:      Result Value   CO2 20 (*)    Creatinine, Ser 1.43 (*)    Anion gap 16 (*)    All other components within normal limits  CBC WITH DIFFERENTIAL/PLATELET - Abnormal; Notable for the following components:   Monocytes Absolute 1.5 (*)    All other components within normal limits  RESP PANEL BY RT-PCR (FLU A&B, COVID) ARPGX2  ETHANOL  RAPID URINE DRUG SCREEN, HOSP PERFORMED    EKG EKG Interpretation  Date/Time:  Tuesday June 30 2021 19:57:38 EST Ventricular Rate:  69 PR Interval:  171 QRS Duration: 97 QT Interval:  412 QTC Calculation: 442 R Axis:   72 Text Interpretation: Sinus rhythm Normal ECG When compared with ECG of 04/17/2021, HEART RATE has increased Confirmed by Dione Booze ([id number]) on 06/30/2021 11:19:51 PM  Radiology No results found.  Procedures Procedures   Medications Ordered in ED Medications - No data to display  ED Course  I have reviewed the triage vital signs and the nursing notes.  Pertinent labs & imaging results that were available during my care of the patient were reviewed by me and considered in my medical decision making (see chart for details).  Clinical Course as of 07/01/21 0018  Tue Jun 30, 2021  1940 Attempted to introduce myself and make contact with patient. Patient is sleeping. He is alert to painful stimuli, GCS 15.  [LR]  2148 Patient continues to sleep, is alert to verbal stimuli but unable to answer my questions [LR]    Clinical Course User Index [LR] Rasa Degrazia, Lora Paula, PA-C   MDM Rules/Calculators/A&P                          Patient is a 25 year old male with a history of schizoaffective disorder and homelessness who presents to the emergency department for psychiatric evaluation.  Patient brought in by police, with report that he was trying to walk into traffic, and was building a hatchet while yelling obscenities.  Attempted examination  multiple times, patient continues to sleep soundly.  He is responsive to verbal stimuli, but does not keep his eyes open and or answer any of my questions.  Clearance labs ordered, and TTS consult placed.  Otherwise patient is medically cleared at this time.  12:15am Patient discussed and care transferred to oncoming provider Petrucelli PA-C at shift change.  Pending TTS evaluation, anticipate patient will remain for inpatient psychiatric evaluation and treatment.  He is voluntary at this time, but I anticipate will be placed under IVC once he is awake and can provide history.   Final Clinical Impression(s) /  ED Diagnoses Final diagnoses:  Psychiatric disturbance    Rx / DC Orders ED Discharge Orders     None      Portions of this report may have been transcribed using voice recognition software. Every effort was made to ensure accuracy; however, inadvertent computerized transcription errors may be present.    Jeanella Flattery 07/01/21 0018    Alvira Monday, MD 07/01/21 1705

## 2021-07-01 ENCOUNTER — Emergency Department (HOSPITAL_COMMUNITY): Payer: Self-pay

## 2021-07-01 LAB — RAPID URINE DRUG SCREEN, HOSP PERFORMED
Amphetamines: NOT DETECTED
Barbiturates: NOT DETECTED
Benzodiazepines: POSITIVE — AB
Cocaine: NOT DETECTED
Opiates: NOT DETECTED
Tetrahydrocannabinol: POSITIVE — AB

## 2021-07-01 LAB — VALPROIC ACID LEVEL: Valproic Acid Lvl: <10 ug/mL — ABNORMAL LOW (ref 50.0–100.0)

## 2021-07-01 MED ORDER — QUETIAPINE FUMARATE 50 MG PO TABS
50.0000 mg | ORAL_TABLET | Freq: Two times a day (BID) | ORAL | Status: DC
  Administered 2021-07-01 (×2): 50 mg via ORAL
  Filled 2021-07-01 (×2): qty 1

## 2021-07-01 MED ORDER — QUETIAPINE FUMARATE 200 MG PO TABS
200.0000 mg | ORAL_TABLET | Freq: Every day | ORAL | Status: DC
  Administered 2021-07-01: 22:00:00 200 mg via ORAL
  Filled 2021-07-01 (×2): qty 1

## 2021-07-01 MED ORDER — SODIUM CHLORIDE 0.9 % IV BOLUS
1000.0000 mL | Freq: Once | INTRAVENOUS | Status: AC
  Administered 2021-07-01: 01:00:00 1000 mL via INTRAVENOUS

## 2021-07-01 MED ORDER — FLUOXETINE HCL 20 MG PO CAPS
20.0000 mg | ORAL_CAPSULE | Freq: Every day | ORAL | Status: DC
  Administered 2021-07-01: 09:00:00 20 mg via ORAL
  Filled 2021-07-01: qty 1

## 2021-07-01 NOTE — ED Notes (Signed)
Lunch Ordered °

## 2021-07-01 NOTE — Progress Notes (Signed)
Inpatient Behavioral Health Placement  Pt meets inpatient criteria per Melbourne Abts, PA-C.There are no appropriate beds at [facility] per [facility] Fransico Michael, RN. Referral was sent to the following facilities;    Destination Service Provider Address Phone Fax  [facility]  [address], [place] [address] [phone]  [facility]  [address] [place] [address] [phone]  [facility] [address]  [address], [place] [address] [phone]  [facility]  [address] [place], [place] [address] [phone]  [facility]  [address] [place] [address] [phone]  [facility]  [address] [place] [address] [phone]  [facility]  [address] [place] [address] [phone]  [facility]  [address], [place] [address] [phone]  [facility]  [address] [place] [address] [phone]  [facility]  [address] [place] [address] [phone]  [facility]  [address] [place] [address] [phone]  [facility]  [address] [place] [address] [phone]  [facility]  [address] [place] [address] [phone]  [facility]       Situation ongoing,  CSW will follow up.   Maryjean Ka, MSW, [facility] 07/01/2021  @ 11:47 PM

## 2021-07-01 NOTE — ED Notes (Signed)
Pt wanded by security and belongings in locker 5.
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
## 2021-07-01 NOTE — ED Notes (Signed)
Pt requesting to be changed. Pt urinated and defecated on himself. This RN grabbed clean scrubs, washcloths, and soap and directed pt to nearest bathroom. Pt ambulatory. This RN changed bed linen.

## 2021-07-01 NOTE — ED Notes (Signed)
Patient walked to bed 52 by sitter.
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
## 2021-07-01 NOTE — ED Notes (Addendum)
Patient cleaned up and placed in paper scrubs. Belongings at bedside with patient asleep.

## 2021-07-01 NOTE — BH Assessment (Signed)
Clinician messaged Valentina Shaggy, RN:  "Hey. It's Trey with TTS. Is the pt able to be assessed, the pt needs to be placed in a private room. Also is the pt under IVC?"   Clinician awaiting response.     Redmond Pulling, MS, [facility], [facility] Triage Specialist [phone]

## 2021-07-01 NOTE — ED Notes (Signed)
Another RN and NT witnessed pt suddenly running away from ED. Pt found near the hospital [employer]. Pt redirected to his hallway bed by security and staff.

## 2021-07-02 NOTE — ED Notes (Signed)
Patient accepted to [facility]  Dr. Betti Cruz is accepting doctor  Going to [facility]  Please go to [facility] first for Covid screening  May arrive anytime after 9am.  Call report to [phone]

## 2021-07-02 NOTE — ED Notes (Addendum)
This RN called [facility] transport number [phone] to arrange transport for pt to [facility]. Voicemail and callback number left.

## 2021-07-07 ENCOUNTER — Ambulatory Visit: Payer: Self-pay | Admitting: Gerontology

## 2021-09-11 ENCOUNTER — Encounter: Payer: Self-pay | Admitting: *Deleted

## 2021-09-11 NOTE — Congregational Nurse Program (Signed)
?  Dept: [phone] ? ? ?Congregational Nurse Program Note ? ?Date of Encounter: 09/11/2021 ? ?Past Medical History: ?Past Medical History:  ?Diagnosis Date  ? Cannabis abuse   ? Paranoid schizophrenia (HCC)   ? Schizoaffective disorder, depressive type (HCC) 09/17/2014  ? ? ?Encounter Details: ? CNP Questionnaire - 09/11/21 0940   ? ?  ? Questionnaire  ? Do you give verbal consent to treat you today? Yes   ? Location Patient Served  [facility]   ? Visit [place] or [address]Phone/Text/Email   ? Patient Status Homeless   currently staying at the [facility]  ? Insurance Uninsured ([place] Card/Care Connects/Self-Pay)   ? Insurance Referral N/A   ? Medication N/A   cleint reports eh is not taking any medications at this time  ? Medical Provider No   [facility]  ? Screening Referrals N/A   ? Medical Referral [facility] PCP/Clinic;[facility]   ? Medical Appointment Made N/A   ? Food Have Food Insecurities   ? Transportation Provided transportation assistance   uses the [employer] bus system  ? Housing/Utilities No permanent housing   ? Interpersonal Safety N/A   ? Intervention Blood pressure;Advocate;Support   ? ED Visit Averted N/A   ? Life-Saving Intervention Made N/A   ? ?  ?  ? ?  ? ?Client came to nurse's office to have his blood pressure checked. Blood pressure 127/71, pulse 81, temperature 98 ?F (36.7 ?C). Client reports he is currently working and stayed outside last night. Gave a list of rooming houses in area. Client reports he has a mental illness and would like to see a therapist. He denies si and hi thoughts. Contacted [facility] and spoke with receptionist along with client. She is to call client back after checking on insurance. Gave client two bus passes for transportation to [facility]. Referred to Lavinia Sharps NP for PCP as she accepts clients with [facility] only.  ?Tejuan Gholson W RN CN ? ? ?

## 2021-09-21 ENCOUNTER — Encounter: Payer: Self-pay | Admitting: *Deleted

## 2021-09-21 NOTE — Congregational Nurse Program (Signed)
?  Dept: [phone] ? ? ?Congregational Nurse Program Note ? ?Date of Encounter: 09/21/2021 ? ?Past Medical History: ?Past Medical History:  ?Diagnosis Date  ? Cannabis abuse   ? Paranoid schizophrenia (HCC)   ? Schizoaffective disorder, depressive type (HCC) 09/17/2014  ? ? ?Encounter Details: ? CNP Questionnaire - 09/21/21 1322   ? ?  ? Questionnaire  ? Do you give verbal consent to treat you today? Yes   ? Location Patient Served  [facility]   ? Visit [facility] [place] or [address]Phone/Text/Email   ? Patient Status Homeless   currently staying at the [employer]  ? Insurance Uninsured ([place] Card/Care Connects/Self-Pay)   ? Insurance Referral N/A   ? Medication N/A   cleint reports eh is not taking any medications at this time  ? Medical Provider No   [facility]  ? Screening Referrals N/A   ? Medical Referral [facility] PCP/Clinic;[facility]   ? Medical Appointment Made N/A   ? Food Have Food Insecurities   ? Transportation Provided transportation assistance   uses the [employer] bus system  ? Housing/Utilities No permanent housing   ? Interpersonal Safety N/A   ? Intervention Advocate;Support   ? ED Visit Averted N/A   ? Life-Saving Intervention Made N/A   ? ?  ?  ? ?  ? ?Client came by nurse's office and reports he has not been to [facility] yet. He received a text from them but does not have an appt. Called [facility] with client and left a message to call client back to schedule an appt. Assisted client in completing [facility] form to see [facility]. Referred to [facility] for additional services. ?Selyna Klahn W RN CN ? ? ?

## 2021-10-31 ENCOUNTER — Emergency Department (HOSPITAL_COMMUNITY): Payer: Medicaid Other

## 2021-10-31 ENCOUNTER — Other Ambulatory Visit: Payer: Self-pay

## 2021-10-31 ENCOUNTER — Encounter (HOSPITAL_COMMUNITY): Payer: Self-pay | Admitting: Emergency Medicine

## 2021-10-31 ENCOUNTER — Emergency Department (HOSPITAL_COMMUNITY)
Admission: EM | Admit: 2021-10-31 | Discharge: 2021-11-01 | Disposition: A | Discharge status: Other Acute Inpatient Hospital | Payer: Medicaid Other | Attending: Emergency Medicine | Admitting: Emergency Medicine

## 2021-10-31 DIAGNOSIS — F250 Schizoaffective disorder, bipolar type: Secondary | ICD-10-CM | POA: Insufficient documentation

## 2021-10-31 DIAGNOSIS — R519 Headache, unspecified: Secondary | ICD-10-CM | POA: Insufficient documentation

## 2021-10-31 DIAGNOSIS — Z20822 Contact with and (suspected) exposure to covid-19: Secondary | ICD-10-CM | POA: Insufficient documentation

## 2021-10-31 DIAGNOSIS — R4689 Other symptoms and signs involving appearance and behavior: Secondary | ICD-10-CM
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
## 2021-11-01 ENCOUNTER — Other Ambulatory Visit: Payer: Self-pay

## 2021-11-01 ENCOUNTER — Encounter: Payer: Self-pay | Admitting: Psychiatry

## 2021-11-01 ENCOUNTER — Encounter (HOSPITAL_COMMUNITY): Payer: Self-pay | Admitting: Emergency Medicine

## 2021-11-01 ENCOUNTER — Inpatient Hospital Stay
Admission: AD | Admit: 2021-11-01 | Discharge: 2021-12-01 | DRG: 885 | Disposition: A | Discharge status: Home / Self Care | Payer: 59 | Source: Intra-hospital | Attending: Psychiatry | Admitting: Psychiatry

## 2021-11-01 DIAGNOSIS — Z813 Family history of other psychoactive substance abuse and dependence: Secondary | ICD-10-CM | POA: Diagnosis not present

## 2021-11-01 DIAGNOSIS — Z880 Allergy status to penicillin: Secondary | ICD-10-CM

## 2021-11-01 DIAGNOSIS — F1210 Cannabis abuse, uncomplicated: Secondary | ICD-10-CM | POA: Diagnosis present

## 2021-11-01 DIAGNOSIS — Z79899 Other long term (current) drug therapy: Secondary | ICD-10-CM | POA: Diagnosis not present

## 2021-11-01 DIAGNOSIS — Z91199 Patient's noncompliance with other medical treatment and regimen due to unspecified reason: Secondary | ICD-10-CM

## 2021-11-01 DIAGNOSIS — R45851 Suicidal ideations: Secondary | ICD-10-CM | POA: Diagnosis present

## 2021-11-01 DIAGNOSIS — Z888 Allergy status to other drugs, medicaments and biological substances status: Secondary | ICD-10-CM | POA: Diagnosis not present

## 2021-11-01 DIAGNOSIS — G4700 Insomnia, unspecified: Secondary | ICD-10-CM | POA: Diagnosis present

## 2021-11-01 DIAGNOSIS — F250 Schizoaffective disorder, bipolar type: Secondary | ICD-10-CM | POA: Diagnosis present

## 2021-11-01 DIAGNOSIS — Z5900 Homelessness unspecified: Secondary | ICD-10-CM

## 2021-11-01 DIAGNOSIS — F17210 Nicotine dependence, cigarettes, uncomplicated: Secondary | ICD-10-CM | POA: Diagnosis present

## 2021-11-01 DIAGNOSIS — Z818 Family history of other mental and behavioral disorders: Secondary | ICD-10-CM | POA: Diagnosis not present

## 2021-11-01 DIAGNOSIS — G8929 Other chronic pain: Secondary | ICD-10-CM | POA: Diagnosis present

## 2021-11-01 DIAGNOSIS — Z833 Family history of diabetes mellitus: Secondary | ICD-10-CM

## 2021-11-01 DIAGNOSIS — F251 Schizoaffective disorder, depressive type: Secondary | ICD-10-CM | POA: Diagnosis not present

## 2021-11-01 LAB — COMPREHENSIVE METABOLIC PANEL WITH GFR
ALT: 29 U/L (ref 0–44)
AST: 45 U/L — ABNORMAL HIGH (ref 15–41)
Albumin: 3.4 g/dL — ABNORMAL LOW (ref 3.5–5.0)
Alkaline Phosphatase: 32 U/L — ABNORMAL LOW (ref 38–126)
Anion gap: 5 (ref 5–15)
BUN: 19 mg/dL (ref 6–20)
CO2: 24 mmol/L (ref 22–32)
Calcium: 8.8 mg/dL — ABNORMAL LOW (ref 8.9–10.3)
Chloride: 109 mmol/L (ref 98–111)
Creatinine, Ser: 1.15 mg/dL (ref 0.61–1.24)
GFR, Estimated: >60 mL/min
Glucose, Bld: 106 mg/dL — ABNORMAL HIGH (ref 70–99)
Potassium: 3.1 mmol/L — ABNORMAL LOW (ref 3.5–5.1)
Sodium: 138 mmol/L (ref 135–145)
Total Bilirubin: 0.8 mg/dL (ref 0.3–1.2)
Total Protein: 6.3 g/dL — ABNORMAL LOW (ref 6.5–8.1)

## 2021-11-01 LAB — CBC WITH DIFFERENTIAL/PLATELET
Abs Immature Granulocytes: 0.03 10*3/uL (ref 0.00–0.07)
Basophils Absolute: 0 10*3/uL (ref 0.0–0.1)
Basophils Relative: 0 %
Eosinophils Absolute: 0.1 10*3/uL (ref 0.0–0.5)
Eosinophils Relative: 2 %
HCT: 40.6 % (ref 39.0–52.0)
Hemoglobin: 13.3 g/dL (ref 13.0–17.0)
Immature Granulocytes: 1 %
Lymphocytes Relative: 24 %
Lymphs Abs: 1.4 10*3/uL (ref 0.7–4.0)
MCH: 30 pg (ref 26.0–34.0)
MCHC: 32.8 g/dL (ref 30.0–36.0)
MCV: 91.6 fL (ref 80.0–100.0)
Monocytes Absolute: 0.8 10*3/uL (ref 0.1–1.0)
Monocytes Relative: 13 %
Neutro Abs: 3.7 10*3/uL (ref 1.7–7.7)
Neutrophils Relative %: 60 %
Platelets: 224 10*3/uL (ref 150–400)
RBC: 4.43 MIL/uL (ref 4.22–5.81)
RDW: 12.3 % (ref 11.5–15.5)
WBC: 6.1 10*3/uL (ref 4.0–10.5)
nRBC: 0 % (ref 0.0–0.2)

## 2021-11-01 LAB — RESP PANEL BY RT-PCR (FLU A&B, COVID) ARPGX2
Influenza A by PCR: NEGATIVE
Influenza B by PCR: NEGATIVE
SARS Coronavirus 2 by RT PCR: NEGATIVE

## 2021-11-01 LAB — ETHANOL: Alcohol, Ethyl (B): <10 mg/dL

## 2021-11-01 LAB — CBG MONITORING, ED: Glucose-Capillary: 91 mg/dL (ref 70–99)

## 2021-11-01 LAB — ACETAMINOPHEN LEVEL: Acetaminophen (Tylenol), Serum: <10 ug/mL — ABNORMAL LOW (ref 10–30)

## 2021-11-01 LAB — SALICYLATE LEVEL: Salicylate Lvl: <7 mg/dL — ABNORMAL LOW (ref 7.0–30.0)

## 2021-11-01 MED ORDER — DIVALPROEX SODIUM 500 MG PO DR TAB
500.0000 mg | DELAYED_RELEASE_TABLET | Freq: Two times a day (BID) | ORAL | Status: DC
  Administered 2021-11-02 – 2021-11-12 (×21): 500 mg via ORAL
  Filled 2021-11-01 (×21): qty 1

## 2021-11-01 MED ORDER — POTASSIUM CHLORIDE CRYS ER 20 MEQ PO TBCR
40.0000 meq | EXTENDED_RELEASE_TABLET | Freq: Once | ORAL | Status: AC
  Administered 2021-11-01: 40 meq via ORAL
  Filled 2021-11-01: qty 2

## 2021-11-01 MED ORDER — QUETIAPINE FUMARATE 25 MG PO TABS
50.0000 mg | ORAL_TABLET | Freq: Two times a day (BID) | ORAL | Status: DC
  Administered 2021-11-02 – 2021-11-19 (×35): 50 mg via ORAL
  Filled 2021-11-01 (×35): qty 2

## 2021-11-01 MED ORDER — MAGNESIUM HYDROXIDE 400 MG/5ML PO SUSP: 30.0000 mL | Freq: Every day | ORAL | Status: DC | PRN

## 2021-11-01 MED ORDER — QUETIAPINE FUMARATE 50 MG PO TABS: 50.0000 mg | ORAL_TABLET | Freq: Two times a day (BID) | ORAL | Status: DC

## 2021-11-01 MED ORDER — ALUM & MAG HYDROXIDE-SIMETH 200-200-20 MG/5ML PO SUSP: 30.0000 mL | ORAL | Status: DC | PRN

## 2021-11-01 MED ORDER — QUETIAPINE FUMARATE 200 MG PO TABS
200.0000 mg | ORAL_TABLET | Freq: Every day | ORAL | Status: DC
  Administered 2021-11-01: 200 mg via ORAL
  Filled 2021-11-01 (×2): qty 1

## 2021-11-01 MED ORDER — NICOTINE 14 MG/24HR TD PT24
14.0000 mg | MEDICATED_PATCH | Freq: Every day | TRANSDERMAL | Status: DC
  Administered 2021-11-02 – 2021-12-01 (×27): 14 mg via TRANSDERMAL
  Filled 2021-11-01 (×29): qty 1

## 2021-11-01 MED ORDER — QUETIAPINE FUMARATE 200 MG PO TABS
200.0000 mg | ORAL_TABLET | Freq: Every day | ORAL | Status: DC
  Administered 2021-11-02: 200 mg via ORAL
  Filled 2021-11-01: qty 1

## 2021-11-01 MED ORDER — DIVALPROEX SODIUM 500 MG PO DR TAB
500.0000 mg | DELAYED_RELEASE_TABLET | Freq: Two times a day (BID) | ORAL | Status: DC
  Administered 2021-11-01: 500 mg via ORAL
  Filled 2021-11-01: qty 1

## 2021-11-01 MED ORDER — ACETAMINOPHEN 325 MG PO TABS
650.0000 mg | ORAL_TABLET | Freq: Once | ORAL | Status: AC
  Administered 2021-11-01: 650 mg via ORAL
  Filled 2021-11-01: qty 2

## 2021-11-01 MED ORDER — FLUOXETINE HCL 20 MG PO CAPS
20.0000 mg | ORAL_CAPSULE | Freq: Every day | ORAL | Status: DC
  Administered 2021-11-02 – 2021-12-01 (×30): 20 mg via ORAL
  Filled 2021-11-01 (×30): qty 1

## 2021-11-01 MED ORDER — FLUOXETINE HCL 20 MG PO CAPS
20.0000 mg | ORAL_CAPSULE | Freq: Every day | ORAL | Status: DC
  Administered 2021-11-01: 20 mg via ORAL
  Filled 2021-11-01: qty 1

## 2021-11-01 MED ORDER — ACETAMINOPHEN 325 MG PO TABS
650.0000 mg | ORAL_TABLET | Freq: Four times a day (QID) | ORAL | Status: DC | PRN
  Administered 2021-11-03 – 2021-11-27 (×14): 650 mg via ORAL
  Filled 2021-11-01 (×14): qty 2
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
## 2021-11-01 NOTE — Progress Notes (Signed)
Per Liborio Nixon, NP, patient meets criteria for inpatient treatment. There are no available at [facility] today, per [facility]. CSW faxed referrals to the following facilities for review: ? ?[facility]  Pending - Request Sent N/A [address] [place] [address] [phone] --  ?[facility]  Pending - Request Sent N/A [address] [place] [address] [phone] --  ?[facility]  Pending - Request Sent N/A [address] [place] [address] [phone] --  ?[facility]  Pending - Request Sent N/A [facility] [address] [place] [address] [phone] --  ?[facility]  Pending - Request Sent N/A [address] [place] [address] [phone] --  ?[facility]  Pending - Request Sent N/A [address] [place] [address] [phone] --  ?[facility]  Pending - Request Sent N/A [address] [place], [place] [address] [phone] --  ?[facility]  Pending - Request Sent N/A [address], [place] [address] [phone] --  ?[facility]  Pending - Request Sent N/A [address] [place] [address] [phone] --  ?[facility]  Pending - Request Sent N/A [address] [place] [address] [phone] --  ?[facility]  Pending - Request Sent N/A [address] [place] [address] [phone] --  ?[facility]  Pending - Request Sent N/A [address] [place] [address] [phone] --  ?[facility]  Pending - Request Sent N/A [address] [place] [address] [phone] --  ?[facility]  Pending - Request Sent N/A [address], [place] [address] [phone] --  ?[facility]  Pending - Request Sent N/A [address] [place] [address] [phone] --  ? ?TTS will continue to seek bed placement. ? ?Crissie Reese, MSW, LCSW-A, LCAS-A ?Phone: [phone] ?Disposition/TOC ? ?

## 2021-11-01 NOTE — Tx Team (Signed)
Initial Treatment Plan ?11/01/2021 ?11:29 PM ?Timothy [name] ?[id number] ? ? ? ?PATIENT STRESSORS: ?Medication change or noncompliance   ?Substance abuse   ? ? ?PATIENT STRENGTHS: ?Ability for insight  ?Motivation for treatment/growth  ? ? ?PATIENT IDENTIFIED PROBLEMS: ?Depression  ?Anxiety  ?  ?  ?  ?  ?  ?  ?  ?  ? ?DISCHARGE CRITERIA:  ?Improved stabilization in mood, thinking, and/or behavior ?Verbal commitment to aftercare and medication compliance ? ?PRELIMINARY DISCHARGE PLAN: ?Outpatient therapy ?Placement in alternative living arrangements ? ?PATIENT/FAMILY INVOLVEMENT: ?This treatment plan has been presented to and reviewed with the patient, Timothy [name]. The patient has been given the opportunity to ask questions and make suggestions. ? ?Mallie Darting, RN ?11/01/2021, 11:29 PM ?
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
## 2021-11-01 NOTE — ED Notes (Addendum)
There is now a sitter at bedside, switches from laughing to crying. ?

## 2021-11-01 NOTE — ED Notes (Signed)
Pt able to ambulate with steady gait to bathroom. Pt still no verbal with ED staff, just communicate with gesture. ?

## 2021-11-01 NOTE — ED Notes (Signed)
Report to Gareth Eagle, Charity fundraiser at [facility] ?

## 2021-11-01 NOTE — Progress Notes (Incomplete)
Per ***, patient meets criteria for inpatient treatment. There are no available or appropriate beds at [facility] today. CSW faxed referrals to the following facilities for review: ? ? ? ?TTS will continue to seek bed placement. ? ? ? ?Glennie Isle, MSW, LCSW-A, LCAS-A ?Phone: [phone] ?Disposition/TOC ? ?

## 2021-11-01 NOTE — BH Assessment (Signed)
@  8295, requested patient's nurse Irving Burton, RN) to move the TTS machine to patient's room, for his initial TTS assessment.  ?

## 2021-11-01 NOTE — BH Assessment (Signed)
@  5093, reached to another nurse Asher Muir, RN), requesting assistance with setting up the TTS cart. Asher Muir, RN  working on setting up the TTS cart.  ?

## 2021-11-01 NOTE — ED Notes (Signed)
Patient sitting in room crying. Asked if there was anything I could do to help, he just shook his head no. ?

## 2021-11-02 DIAGNOSIS — F250 Schizoaffective disorder, bipolar type: Secondary | ICD-10-CM

## 2021-11-02 NOTE — Plan of Care (Signed)
?  Problem: Education: ?Goal: Will be free of psychotic symptoms ?Description: Patient will be free of psychosis  ?Outcome: Progressing ? Patient will appear free of psychosis  ?

## 2021-11-02 NOTE — Progress Notes (Signed)
[facility] LCSW Progress Note ?  ?11/02/2021    1:16 PM ? ?Tedrick T Northington  ? ?[id number]  ? ?Type of Contact and Topic: PSA Attempt  ? ?CSW met with patient at bedside in order to attempt to complete PSA. Patient was unable/unwilling to complete PSA at this time. All responses were simply "I do not know" regardless of whether that was an appropriate answer or not.  ?  ?Signed:  ? W. , MSW, LCSWA, LCAS ?11/02/2021 1:16 PM ?  ?  ?
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
## 2021-11-02 NOTE — Progress Notes (Signed)
Patient isolative to his room tonight, denies SI/HI/AVH. Pt presents flat and depressed. Pt did come get snack and QHS medications. Pt given education, support, and encouragement to be active in his treatment plan. Pt being monitored Q 15 minutes for safety per unit protocol. Pt remains safe on the unit.  ?

## 2021-11-02 NOTE — BH IP Treatment Plan (Signed)
Interdisciplinary Treatment and Diagnostic Plan Update ? ?11/02/2021 ?Time of Session: 0930 ?Timothy [name] ?MRN: [id number] ? ?Principal Diagnosis: Schizoaffective disorder, bipolar type ([place]) ? ?Secondary Diagnoses: Principal Problem: ?  Schizoaffective disorder, bipolar type ([place]) ? ? ?Current Medications:  ?Current Facility-Administered Medications  ?Medication Dose Route Frequency Provider Last Rate Last Admin  ? acetaminophen (TYLENOL) tablet 650 mg  650 mg Oral Q6H PRN Patrecia Pour, NP      ? alum & mag hydroxide-simeth (MAALOX/MYLANTA) 200-200-20 MG/5ML suspension 30 mL  30 mL Oral Q4H PRN Patrecia Pour, NP      ? divalproex (DEPAKOTE) DR tablet 500 mg  500 mg Oral Q12H Patrecia Pour, NP   500 mg at 11/02/21 8546  ? FLUoxetine (PROZAC) capsule 20 mg  20 mg Oral Daily Patrecia Pour, NP   20 mg at 11/02/21 2703  ? magnesium hydroxide (MILK OF MAGNESIA) suspension 30 mL  30 mL Oral Daily PRN Patrecia Pour, NP      ? nicotine (NICODERM CQ - dosed in mg/24 hours) patch 14 mg  14 mg Transdermal Daily Clapacs, Madie Reno, MD   14 mg at 11/02/21 0825  ? QUEtiapine (SEROQUEL) tablet 200 mg  200 mg Oral QHS Patrecia Pour, NP      ? QUEtiapine (SEROQUEL) tablet 50 mg  50 mg Oral BID Patrecia Pour, NP   50 mg at 11/02/21 5009  ? ?PTA Medications: ?Medications Prior to Admission  ?Medication Sig Dispense Refill Last Dose  ? divalproex (DEPAKOTE) 500 MG DR tablet Take 1 tablet (500 mg total) by mouth every 12 (twelve) hours. (Patient not taking: Reported on 07/02/2021) 60 tablet 1   ? FLUoxetine (PROZAC) 20 MG capsule Take 1 capsule (20 mg total) by mouth daily. (Patient not taking: Reported on 07/02/2021) 30 capsule 1   ? QUEtiapine (SEROQUEL) 200 MG tablet Take 1 tablet (200 mg total) by mouth at bedtime. (Patient not taking: Reported on 07/02/2021) 30 tablet 0   ? QUEtiapine (SEROQUEL) 50 MG tablet Take 1 tablet (50 mg total) by mouth 2 (two) times daily. (Patient not taking: Reported on 07/02/2021) 60 tablet 0    ? ? ?Patient Stressors: Medication change or noncompliance   ?Substance abuse   ? ?Patient Strengths: Ability for insight  ?Motivation for treatment/growth  ? ?Treatment Modalities: Medication Management, Group therapy, Case management,  ?1 to 1 session with clinician, Psychoeducation, Recreational therapy. ? ? ?Physician Treatment Plan for Primary Diagnosis: Schizoaffective disorder, bipolar type ([place]) ?Long Term Goal(s):    ? ?Short Term Goals:   ? ?Medication Management: Evaluate patient's response, side effects, and tolerance of medication regimen. ? ?Therapeutic Interventions: 1 to 1 sessions, Unit Group sessions and Medication administration. ? ?Evaluation of Outcomes: Not Met ? ?Physician Treatment Plan for Secondary Diagnosis: Principal Problem: ?  Schizoaffective disorder, bipolar type ([place]) ? ?Long Term Goal(s):    ? ?Short Term Goals:      ? ?Medication Management: Evaluate patient's response, side effects, and tolerance of medication regimen. ? ?Therapeutic Interventions: 1 to 1 sessions, Unit Group sessions and Medication administration. ? ?Evaluation of Outcomes: Not Met ? ? ?RN Treatment Plan for Primary Diagnosis: Schizoaffective disorder, bipolar type ([place]) ?Long Term Goal(s): Knowledge of disease and therapeutic regimen to maintain health will improve ? ?Short Term Goals: Ability to remain free from injury will improve, Ability to verbalize frustration and anger appropriately will improve, Ability to demonstrate self-control, Ability to participate in decision making will improve, Ability  to verbalize feelings will improve, Ability to disclose and discuss suicidal ideas, Ability to identify and develop effective coping behaviors will improve, and Compliance with prescribed medications will improve ? ?Medication Management: RN will administer medications as ordered by provider, will assess and evaluate patient's response and provide education to patient for prescribed medication. RN will report  any adverse and/or side effects to prescribing provider. ? ?Therapeutic Interventions: 1 on 1 counseling sessions, Psychoeducation, Medication administration, Evaluate responses to treatment, Monitor vital signs and CBGs as ordered, Perform/monitor CIWA, COWS, AIMS and Fall Risk screenings as ordered, Perform wound care treatments as ordered. ? ?Evaluation of Outcomes: Not Met ? ? ?LCSW Treatment Plan for Primary Diagnosis: Schizoaffective disorder, bipolar type ([place]) ?Long Term Goal(s): Safe transition to appropriate next level of care at discharge, Engage patient in therapeutic group addressing interpersonal concerns. ? ?Short Term Goals: Engage patient in aftercare planning with referrals and resources, Increase social support, Increase ability to appropriately verbalize feelings, Increase emotional regulation, Facilitate acceptance of mental health diagnosis and concerns, Facilitate patient progression through stages of change regarding substance use diagnoses and concerns, and Identify triggers associated with mental health/substance abuse issues ? ?Therapeutic Interventions: Assess for all discharge needs, 1 to 1 time with Education officer, museum, Explore available resources and support systems, Assess for adequacy in community support network, Educate family and significant other(s) on suicide prevention, Complete Psychosocial Assessment, Interpersonal group therapy. ? ?Evaluation of Outcomes: Not Met ? ? ?Progress in Treatment: ?Attending groups: No. ?Participating in groups: No. ?Taking medication as prescribed: Yes. ?Toleration medication: Yes. ?Family/Significant other contact made: No, will contact:  CSW Will obtain consent to reach collateral.  ?Patient understands diagnosis: No. ?Discussing patient identified problems/goals with staff: No. ?Medical problems stabilized or resolved: Yes. ?Denies suicidal/homicidal ideation: No. ?Issues/concerns per patient self-inventory: Yes. ?Other: none ? ?New problem(s)  identified: No, Describe:  No additional problems/concerns identified at this time.   ? ?New Short Term/Long Term Goal(s): Patient to work towards medication management for mood stabilization; elimination of SI thoughts; development of comprehensive mental wellness plan. ? ?Patient Goals:  Patient was present for treatment team meeting though expressed no goals for treatment at this time.  ? ?Discharge Plan or Barriers: Patient continues to be homeless as similar to prior inpatient hospitalizations. CSW will have a further conversations with patient to discuss options for housing after discharge.  ? ?Reason for Continuation of Hospitalization: Depression ?Suicidal ideation ? ?Estimated Length of Stay: 1-7 days  ? ?Last 3 [place] Suicide Severity Risk Score: ?[facility] Admission (Current) from 11/01/2021 in [facility] from 10/31/2021 in [facility] from 06/30/2021 in [facility]  ?C-SSRS RISK CATEGORY No Risk No Risk High Risk  ? ?  ? ? ?Last PHQ 2/9 Scores: ? ?  12/31/2019  ? 10:13 AM  ?Depression screen PHQ 2/9  ?Decreased Interest 2  ?Down, Depressed, Hopeless 1  ?PHQ - 2 Score 3  ?Altered sleeping 3  ?Tired, decreased energy 3  ?Trouble concentrating 1  ?Moving slowly or fidgety/restless 2  ?Suicidal thoughts 2  ?PHQ-9 Score 14  ?Difficult doing work/chores Very difficult  ? ? ?Scribe for Treatment Team: ?Larose Kells ?11/02/2021 ?10:51 AM ? ? ?

## 2021-11-02 NOTE — Plan of Care (Signed)
Patient isolates in his room. Appears with a sad affect and minimal interaction with staff & peers. Patient response " I don't know " for most of the questions. Denies SI,HI and AVH. Appetite and energy level good. ADLs maintained. Support and encouragement given. ?

## 2021-11-02 NOTE — Group Note (Signed)
[facility] LCSW Group Therapy Note ? ? ? ?Group Date: 11/02/2021 ?Start Time: 1300 ?End Time: 1400 ? ?Type of Therapy and Topic:  Group Therapy:  Overcoming Obstacles ? ?Participation Level:  [facility] PARTICIPATION LEVEL: Did Not Attend ? ?Mood: ? ?Description of Group:   ?In this group patients will be encouraged to explore what they see as obstacles to their own wellness and recovery. They will be guided to discuss their thoughts, feelings, and behaviors related to these obstacles. The group will process together ways to cope with barriers, with attention given to specific choices patients can make. Each patient will be challenged to identify changes they are motivated to make in order to overcome their obstacles. This group will be process-oriented, with patients participating in exploration of their own experiences as well as giving and receiving support and challenge from other group members. ? ?Therapeutic Goals: ?1. Patient will identify personal and current obstacles as they relate to admission. ?2. Patient will identify barriers that currently interfere with their wellness or overcoming obstacles.  ?3. Patient will identify feelings, thought process and behaviors related to these barriers. ?4. Patient will identify two changes they are willing to make to overcome these obstacles:  ? ? ?Summary of Patient Progress ? ? ?CSW offered group, patient chose not to attend.  ? ? ?Therapeutic Modalities:   ?Cognitive Behavioral Therapy ?Solution Focused Therapy ?Motivational Interviewing ?Relapse Prevention Therapy ? ? ?Korin Setzler J Sabel Hornbeck, LCSW ?

## 2021-11-02 NOTE — Plan of Care (Addendum)
Patient alert and oriented x 4, affect is blunted thoughts are organized and coherent he denies SI/HI/AVH he is complaint with medication regimen. 15 minutes safety checks maintained will continue to monitor closely.   ?

## 2021-11-03 DIAGNOSIS — F250 Schizoaffective disorder, bipolar type: Secondary | ICD-10-CM | POA: Diagnosis not present

## 2021-11-03 MED ORDER — QUETIAPINE FUMARATE 200 MG PO TABS
300.0000 mg | ORAL_TABLET | Freq: Every day | ORAL | Status: DC
  Administered 2021-11-03 – 2021-11-04 (×2): 300 mg via ORAL
  Filled 2021-11-03 (×2): qty 1
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
## 2021-11-03 NOTE — Plan of Care (Signed)
D- Patient alert and oriented. Patient presented in a pleasant mood on assessment stating that he slept good last night and had complaints of lower back pain. Patient rated his pain a "10/10", in which he did request PRN pain medication from this writer, and was given Tylenol. Although he denied it to this Clinical research associate, patient endorsed depression/anxiety and hopelessness on his self-inventory, rating them all a "10/10". Patient did not go into detail about why he's feeling this way. Patient also endorsed VH, stating that he is "seeing spots". Patient denied SI, HI, AH at this time. Patient's goal for today is "communication with people", in which he doesn't know what he'll do in order to achieve his goal. ? ?A- Scheduled medications administered to patient, per MD orders. Support and encouragement provided. Routine safety checks conducted every 15 minutes.  Patient informed to notify staff with problems or concerns. ? ?R- No adverse drug reactions noted. Patient contracts for safety at this time. Patient compliant with medications and treatment plan. Patient receptive, calm, and cooperative. Patient remains safe at this time. ? ?Problem: Education: ?Goal: Knowledge of [facility] [place] Education information/materials will improve ?Outcome: Not Progressing ?Goal: Emotional status will improve ?Outcome: Not Progressing ?Goal: Mental status will improve ?Description: Patient will became more alert no confusion to place and situation  ?Outcome: Not Progressing ?Goal: Verbalization of understanding the information provided will improve ?Outcome: Not Progressing ?  ?Problem: Activity: ?Goal: Interest or engagement in activities will improve ?Outcome: Not Progressing ?Goal: Sleeping patterns will improve ?Outcome: Not Progressing ?  ?Problem: Coping: ?Goal: Ability to verbalize frustrations and anger appropriately will improve ?Description: Patient will verbalize frustration and anger to the staff appropriately.  ?Outcome:  Not Progressing ?Goal: Ability to demonstrate self-control will improve ?Outcome: Not Progressing ?  ?Problem: Health Behavior/Discharge Planning: ?Goal: Identification of resources available to assist in meeting health care needs will improve ?Outcome: Not Progressing ?Goal: Compliance with treatment plan for underlying cause of condition will improve ?Outcome: Not Progressing ?  ?Problem: Physical Regulation: ?Goal: Ability to maintain clinical measurements within normal limits will improve ?Outcome: Not Progressing ?  ?Problem: Safety: ?Goal: Periods of time without injury will increase ?Outcome: Not Progressing ?  ?Problem: Activity: ?Goal: Will verbalize the importance of balancing activity with adequate rest periods ?Outcome: Not Progressing ?  ?Problem: Education: ?Goal: Will be free of psychotic symptoms ?Description: Patient will be free of psychosis  ?Outcome: Not Progressing ?Goal: Knowledge of the prescribed therapeutic regimen will improve ?Outcome: Not Progressing ?  ?Problem: Coping: ?Goal: Coping ability will improve ?Outcome: Not Progressing ?Goal: Will verbalize feelings ?Outcome: Not Progressing ?  ?Problem: Health Behavior/Discharge Planning: ?Goal: Compliance with prescribed medication regimen will improve ?Outcome: Not Progressing ?  ?Problem: Nutritional: ?Goal: Ability to achieve adequate nutritional intake will improve ?Outcome: Not Progressing ?  ?Problem: Role Relationship: ?Goal: Ability to communicate needs accurately will improve ?Outcome: Not Progressing ?Goal: Ability to interact with others will improve ?Outcome: Not Progressing ?  ?Problem: Safety: ?Goal: Ability to redirect hostility and anger into socially appropriate behaviors will improve ?Outcome: Not Progressing ?Goal: Ability to remain free from injury will improve ?Outcome: Not Progressing ?  ?Problem: Self-Care: ?Goal: Ability to participate in self-care as condition permits will improve ?Outcome: Not Progressing ?   ?Problem: Self-Concept: ?Goal: Will verbalize positive feelings about self ?Outcome: Not Progressing ?  ?

## 2021-11-03 NOTE — Group Note (Signed)
[facility] LCSW Group Therapy Note ? ? ? ?Group Date: 11/03/2021 ?Start Time: 1300 ?End Time: 1400 ? ?Type of Therapy and Topic:  Group Therapy:  Overcoming Obstacles ? ?Participation Level:  [facility] PARTICIPATION LEVEL: None ? ?Mood: blunted  ? ?Description of Group:   ?In this group patients will be encouraged to explore what they see as obstacles to their own wellness and recovery. They will be guided to discuss their thoughts, feelings, and behaviors related to these obstacles. The group will process together ways to cope with barriers, with attention given to specific choices patients can make. Each patient will be challenged to identify changes they are motivated to make in order to overcome their obstacles. This group will be process-oriented, with patients participating in exploration of their own experiences as well as giving and receiving support and challenge from other group members. ? ?Therapeutic Goals: ?1. Patient will identify personal and current obstacles as they relate to admission. ?2. Patient will identify barriers that currently interfere with their wellness or overcoming obstacles.  ?3. Patient will identify feelings, thought process and behaviors related to these barriers. ?4. Patient will identify two changes they are willing to make to overcome these obstacles:  ? ? ?Summary of Patient Progress ?Patient did not participate in topic of discussion in any capacity.   ? ? ?Therapeutic Modalities:   ?Cognitive Behavioral Therapy ?Solution Focused Therapy ?Motivational Interviewing ?Relapse Prevention Therapy ? ? ?Corky Crafts, LCSWA ?
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
## 2021-11-03 NOTE — Progress Notes (Signed)
Patient has been observed coming out of his room, throughput the day, to attend unit groups.  ?

## 2021-11-03 NOTE — Progress Notes (Signed)
Patient has been more visible in the milieu this afternoon. He doesn't seem to be interacting with other members on the unit, however, he hasn't posed any issues. Patient remains safe on the unit. ?

## 2021-11-03 NOTE — [facility] Suicide Risk Assessment (Signed)
[facility] INPATIENT:  Family/Significant Other Suicide Prevention Education ? ?Suicide Prevention Education:  ?Patient Refusal for Family/Significant Other Suicide Prevention Education: The patient Timothy [name] has refused to provide written consent for family/significant other to be provided Family/Significant Other Suicide Prevention Education during admission and/or prior to discharge.  Physician notified. ? ?SPE completed with pt, as pt refused to consent to family contact. SPI pamphlet provided to pt and pt was encouraged to share information with support network, ask questions, and talk about any concerns relating to SPE. Pt denies access to guns/firearms and verbalized understanding of information provided. Mobile Crisis information also provided to pt.  ? ? ?Harden Mo ?11/03/2021, 2:27 PM ?

## 2021-11-04 DIAGNOSIS — F250 Schizoaffective disorder, bipolar type: Secondary | ICD-10-CM | POA: Diagnosis not present

## 2021-11-04 LAB — LIPID PANEL
Cholesterol: 149 mg/dL (ref 0–200)
HDL: 35 mg/dL — ABNORMAL LOW (ref >40)
LDL Cholesterol: 103 mg/dL — ABNORMAL HIGH (ref 0–99)
Total CHOL/HDL Ratio: 4.3 RATIO
Triglycerides: 56 mg/dL (ref <150)
VLDL: 11 mg/dL (ref 0–40)

## 2021-11-04 LAB — HEMOGLOBIN A1C
Hgb A1c MFr Bld: 5.5 % (ref 4.8–5.6)
Mean Plasma Glucose: 111.15 mg/dL

## 2021-11-04 NOTE — Progress Notes (Signed)
Recreation Therapy Notes ? ?INPATIENT RECREATION TR PLAN ? ?Patient Details ?Name: Timothy [name] ?MRN: [id number] ?DOB: 1996-04-15 ?Today's Date: 11/04/2021 ? ?Rec Therapy Plan ?Is patient appropriate for Therapeutic Recreation?: Yes ?Treatment times per week: at least 3 ?Estimated Length of Stay: 5-7 days ?TR Treatment/Interventions: Group participation (Comment) ? ?Discharge Criteria ?Pt will be discharged from therapy if:: Discharged ?Treatment plan/goals/alternatives discussed and agreed upon by:: Patient/family ? ?Discharge Summary ?  ? ? ?Setsuko Robins ?11/04/2021, 3:10 PM ?

## 2021-11-04 NOTE — Plan of Care (Signed)
?  Problem: Education: ?Goal: Knowledge of [facility] Education information/materials will improve ?Outcome: Not Progressing ?Goal: Emotional status will improve ?Outcome: Not Progressing ?Goal: Mental status will improve ?Description: Patient will became more alert no confusion to place and situation  ?Outcome: Not Progressing ?Goal: Verbalization of understanding the information provided will improve ?Outcome: Not Progressing ?  ?Problem: Activity: ?Goal: Interest or engagement in activities will improve ?Outcome: Not Progressing ?Goal: Sleeping patterns will improve ?Outcome: Not Progressing ?  ?Problem: Self-Care: ?Goal: Ability to participate in self-care as condition permits will improve ?Outcome: Not Progressing ?  ?Problem: Safety: ?Goal: Ability to redirect hostility and anger into socially appropriate behaviors will improve ?Outcome: Not Progressing ?Goal: Ability to remain free from injury will improve ?Outcome: Not Progressing ?  ?

## 2021-11-04 NOTE — Plan of Care (Signed)
?  Problem: Education: ?Goal: Knowledge of [place] [facility] Education information/materials will improve ?Outcome: Progressing ?Goal: Emotional status will improve ?Outcome: Progressing ?Goal: Mental status will improve ?Description: Patient will became more alert no confusion to place and situation  ?Outcome: Progressing ?Goal: Verbalization of understanding the information provided will improve ?Outcome: Progressing ?  ?Problem: Activity: ?Goal: Interest or engagement in activities will improve ?Outcome: Progressing ?Goal: Sleeping patterns will improve ?Outcome: Progressing ?  ?Problem: Coping: ?Goal: Ability to verbalize frustrations and anger appropriately will improve ?Description: Patient will verbalize frustration and anger to the staff appropriately.  ?Outcome: Progressing ?Goal: Ability to demonstrate self-control will improve ?Outcome: Progressing ?  ?Problem: Health Behavior/Discharge Planning: ?Goal: Identification of resources available to assist in meeting health care needs will improve ?Outcome: Progressing ?Goal: Compliance with treatment plan for underlying cause of condition will improve ?Outcome: Progressing ?  ?Problem: Physical Regulation: ?Goal: Ability to maintain clinical measurements within normal limits will improve ?Outcome: Progressing ?  ?

## 2021-11-04 NOTE — Progress Notes (Signed)
Patient is better today.  Presents with much brighter affect. More talkative and active on the unit.  He is med compliant and received meds without incident.  Continues to endorse  depression and audio hallucinations, but reports he is getting better. He denies si vh anxiety and pain at this encounter.  Will continue to monitor with q 15 minute safety checks.  Encouraged him to seek staff with any concerns. ? ? ? ?C Butler-Nicholson, LPN ? ? ? ? ?

## 2021-11-04 NOTE — Plan of Care (Signed)
Patient put 10/10 for depression and anxiety on the self inventory sheet. But patient brightens up on approach. More visible in the milieu and attended groups. Patient states that he is feeling better and denies SI,HI and AVH. Appetite and energy level good. ADLs maintained. Support and encouragement given. ?

## 2021-11-04 NOTE — Progress Notes (Signed)
Recreation Therapy Notes ? ?Date: 11/04/2021 ? ?Time: 10:40 am   ? ?Location: Courtyard ? ?Behavioral response: Appropriate ? ?Intervention Topic: Communication    ? ?Discussion/Intervention:  ?Group content today was focused on communication. The group defined communication and ways to communicate with others. Individuals stated reason why communication is important and some reasons to communicate with others. Patients expressed if they thought they were good at communicating with others and ways they could improve their communication skills. The group identified important parts of communication and some experiences they have had in the past with communication. The group participated in the intervention ?What is that??, where they had a chance to test out their communication skills and identify ways to improve their communication techniques.  ?Clinical Observations/Feedback: ?Patient came to group and was able to identify and participate in different types of communication. Individual was social with peers and staff while participating in the intervention.  ?Timothy [name] LRT/CTRS  ? ? ? ? ? ? ? ? ? ? ?Timothy [name] ?11/04/2021 12:02 PM ?

## 2021-11-04 NOTE — Progress Notes (Signed)
[facility] MD Progress Note ? ?11/04/2021 5:03 PM ?Timothy [name]  ?MRN:  [id number] ?Subjective: Patient seen and chart reviewed.  Follow-up for this patient with schizoaffective disorder depressive.  Patient up out of bed attending groups today.  Made better eye contact.  Says he is feeling better.  Still some hallucinations but less than previously.  Denies active suicidal thought. ?Principal Problem: Schizoaffective disorder, bipolar type ([place]) ?Diagnosis: Principal Problem: ?  Schizoaffective disorder, bipolar type ([place]) ? ?Total Time spent with patient: 30 minutes ? ?Past Psychiatric History: Past history of schizoaffective disorder recurrent psychotic depression episodes ? ?Past Medical History:  ?Past Medical History:  ?Diagnosis Date  ? Cannabis abuse   ? Paranoid schizophrenia ([place])   ? Schizoaffective disorder, depressive type ([place]) 09/17/2014  ? History reviewed. No pertinent surgical history. ?Family History:  ?Family History  ?Problem Relation Age of Onset  ? Mental illness Brother   ? Drug abuse Brother   ? Mental illness Cousin   ? Suicidality Cousin   ? Diabetes Maternal Grandmother   ? ?Family Psychiatric  History: See previous ?Social History:  ?Social History  ? ?Substance and Sexual Activity  ?Alcohol Use Yes  ? Alcohol/week: 2.0 standard drinks  ? Types: 2 Cans of beer per week  ?   ?Social History  ? ?Substance and Sexual Activity  ?Drug Use Yes  ? 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  ?Social History  ? ?Socioeconomic History  ? Marital status: Single  ?  Spouse name: Not on file  ? Number of children: Not on file  ? Years of education: Not on file  ? Highest education level: Not on file  ?Occupational History  ? Not on file  ?Tobacco Use  ? Smoking status: Some Days  ?  Packs/day: 0.25  ?  Types: Cigarettes  ? Smokeless tobacco: Never  ?Vaping Use  ? Vaping Use: Never used  ?Substance and Sexual Activity  ? Alcohol use: Yes  ?  Alcohol/week: 2.0 standard drinks  ?  Types: 2 Cans of beer per week  ? Drug  use: Yes  ?  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 Sexual activity: Yes  ?Other Topics Concern  ? Not on file  ?Social History Narrative  ? Not on file  ? ?Social Determinants of Health  ? ?Financial Resource Strain: Not on file  ?Food Insecurity: Not on file  ?Transportation Needs: Not on file  ?Physical Activity: Not on file  ?Stress: Not on file  ?Social Connections: Not on file  ? ?Additional Social History:  ?  ?  ?  ?  ?  ?  ?  ?  ?  ?  ?  ? ?Sleep: Fair ? ?Appetite:  Fair ? ?Current Medications: ?Current Facility-Administered Medications  ?Medication Dose Route Frequency Provider Last Rate Last Admin  ? acetaminophen (TYLENOL) tablet 650 mg  650 mg Oral Q6H PRN Patrecia Pour, NP   650 mg at 11/03/21 0820  ? alum & mag hydroxide-simeth (MAALOX/MYLANTA) 200-200-20 MG/5ML suspension 30 mL  30 mL Oral Q4H PRN Patrecia Pour, NP      ? divalproex (DEPAKOTE) DR tablet 500 mg  500 mg Oral Q12H Patrecia Pour, NP   500 mg at 11/04/21 [id number]  ? FLUoxetine (PROZAC) capsule 20 mg  20 mg Oral Daily Patrecia Pour, NP   20 mg at 11/04/21 [id number]  ? magnesium hydroxide (MILK OF MAGNESIA) suspension 30 mL  30 mL Oral Daily PRN Patrecia Pour, NP      ?  nicotine (NICODERM CQ - dosed in mg/24 hours) patch 14 mg  14 mg Transdermal Daily Colbie Danner, Madie Reno, MD   14 mg at 11/04/21 0815  ? QUEtiapine (SEROQUEL) tablet 300 mg  300 mg Oral QHS Mart Colpitts, Madie Reno, MD   300 mg at 11/03/21 2117  ? QUEtiapine (SEROQUEL) tablet 50 mg  50 mg Oral BID Patrecia Pour, NP   50 mg at 11/04/21 [id number]  ? ? ?Lab Results:  ?Results for orders placed or performed during the hospital encounter of 11/01/21 (from the past 48 hour(s))  ?Hemoglobin A1c     Status: None  ? Collection Time: 11/04/21  7:29 AM  ?Result Value Ref Range  ? Hgb A1c MFr Bld 5.5 4.8 - 5.6 %  ?  Comment: (NOTE) ?Pre diabetes:          5.7%-6.4% ? ?Diabetes:              >6.4% ? ?Glycemic control for   <7.0% ?adults with diabetes ?  ? Mean Plasma Glucose 111.15 mg/dL  ?  Comment:  Performed at [facility], [place] [address], [place], [place] [address]  ?Lipid panel     Status: Abnormal  ? Collection Time: 11/04/21  7:29 AM  ?Result Value Ref Range  ? Cholesterol 149 0 - 200 mg/dL  ? Triglycerides 56 <150 mg/dL  ? HDL 35 (L) >40 mg/dL  ? Total CHOL/HDL Ratio 4.3 RATIO  ? VLDL 11 0 - 40 mg/dL  ? LDL Cholesterol 103 (H) 0 - 99 mg/dL  ?  Comment:        ?Total Cholesterol/HDL:CHD Risk ?Coronary Heart Disease Risk Table ?                    Men   Women ? 1/2 Average Risk   3.4   3.3 ? Average Risk       5.0   4.4 ? 2 X Average Risk   9.6   7.1 ? 3 X Average Risk  23.4   11.0 ?       ?Use the calculated Patient Ratio ?above and the CHD Risk Table ?to determine the patient's CHD Risk. ?       ?ATP III CLASSIFICATION (LDL): ? <100     mg/dL   Optimal ? 100-129  mg/dL   Near or Above ?                   Optimal ? 130-159  mg/dL   Borderline ? 160-189  mg/dL   High ? >190     mg/dL   Very High ?Performed at [facility], [address] [place], [place] [address] ?  ? ? ?Blood Alcohol level:  ?Lab Results  ?Component Value Date  ? ETH <10 10/31/2021  ? ETH <10 06/30/2021  ? ? ?Metabolic Disorder Labs: ?Lab Results  ?Component Value Date  ? HGBA1C 5.5 11/04/2021  ? MPG 111.15 11/04/2021  ? MPG 114 06/17/2021  ? ?Lab Results  ?Component Value Date  ? PROLACTIN 33.2 (H) 08/15/2016  ? ?Lab Results  ?Component Value Date  ? CHOL 149 11/04/2021  ? TRIG 56 11/04/2021  ? HDL 35 (L) 11/04/2021  ? CHOLHDL 4.3 11/04/2021  ? VLDL 11 11/04/2021  ? LDLCALC 103 (H) 11/04/2021  ? LDLCALC 111 (H) 06/17/2021  ? ? ?Physical Findings: ?AIMS:  , ,  ,  ,    ?CIWA:    ?COWS:    ? ?  Musculoskeletal: ?Strength & Muscle Tone: within normal limits ?Gait & Station: normal ?Patient leans: N/A ? ?Psychiatric Specialty Exam: ? ?Presentation  ?General Appearance: Appropriate for Environment ? ?Eye Contact:Fair ? ?Speech:Normal Rate ? ?Speech Volume:Normal ? ?Handedness:Right ? ? ?Mood and Affect   ?Mood:Euthymic ? ?Affect:Appropriate ? ? ?Thought Process  ?Thought Processes:Coherent ? ?Descriptions of Associations:Intact ? ?Orientation:Full (Time, Place and Person) ? ?Thought Content:Logical ? ?History of Schizophrenia/Schizoaffective disorder:Yes ? ?Duration of Psychotic Symptoms:Greater than six months ? ?Hallucinations:No data recorded ?Ideas of Reference:None ? ?Suicidal Thoughts:No data recorded ?Homicidal Thoughts:No data recorded ? ?Sensorium  ?Memory:Immediate Good; Recent Good; Remote Good ? ?Judgment:Fair ? ?Insight:Fair ? ? ?Executive Functions  ?Concentration:Good ? ?Attention Span:Good ? ?Recall:Fair ? ?[place] ? ?Language:Good ? ? ?Psychomotor Activity  ?Psychomotor Activity:No data recorded ? ?Assets  ?Assets:Communication Skills; Desire for Improvement; Financial Resources/Insurance; Resilience; Social Support ? ? ?Sleep  ?Sleep:No data recorded ? ? ?Physical Exam: ?Physical Exam ?Vitals and nursing note reviewed.  ?Constitutional:   ?   Appearance: Normal appearance.  ?HENT:  ?   Head: Normocephalic and atraumatic.  ?   Mouth/Throat:  ?   Pharynx: Oropharynx is clear.  ?Eyes:  ?   Pupils: Pupils are equal, round, and reactive to light.  ?Cardiovascular:  ?   Rate and Rhythm: Normal rate and regular rhythm.  ?Pulmonary:  ?   Effort: Pulmonary effort is normal.  ?   Breath sounds: Normal breath sounds.  ?Abdominal:  ?   General: Abdomen is flat.  ?   Palpations: Abdomen is soft.  ?Musculoskeletal:     ?   General: Normal range of motion.  ?Skin: ?   General: Skin is warm and dry.  ?Neurological:  ?   General: No focal deficit present.  ?   Mental Status: He is alert. Mental status is at baseline.  ?Psychiatric:     ?   Attention and Perception: Attention normal.     ?   Mood and Affect: Mood normal. Affect is blunt.     ?   Speech: Speech is delayed.     ?   Behavior: Behavior is slowed.     ?   Thought Content: Thought content normal.     ?   Cognition and Memory: Memory is  impaired.  ? ?Review of Systems  ?Constitutional: Negative.   ?HENT: Negative.    ?Eyes: Negative.   ?Respiratory: Negative.    ?Cardiovascular: Negative.   ?Gastrointestinal: Negative.   ?Musculoskeletal: Negative.   ?Skin: Negative

## 2021-11-04 NOTE — Group Note (Signed)
[facility] LCSW Group Therapy Note ? ? ?Group Date: 11/04/2021 ?Start Time: 1300 ?End Time: 1400 ? ? ?Type of Therapy/Topic:  Group Therapy:  Emotion Regulation ? ?Participation Level:  Active  ? ?Mood: ? ?Description of Group:   ? The purpose of this group is to assist patients in learning to regulate negative emotions and experience positive emotions. Patients will be guided to discuss ways in which they have been vulnerable to their negative emotions. These vulnerabilities will be juxtaposed with experiences of positive emotions or situations, and patients challenged to use positive emotions to combat negative ones. Special emphasis will be placed on coping with negative emotions in conflict situations, and patients will process healthy conflict resolution skills. ? ?Therapeutic Goals: ?Patient will identify two positive emotions or experiences to reflect on in order to balance out negative emotions:  ?Patient will label two or more emotions that they find the most difficult to experience:  ?Patient will be able to demonstrate positive conflict resolution skills through discussion or role plays:  ? ?Summary of Patient Progress: ?Patient present in group. Patient was an active participant. Patient was engaged and shared in group.  Patient shared that emotions that he has felt recently has been "anger" and "frustration".  He was able to discuss how "karma" and "disagreements with others" have led to negative interactions and feelings.  Patient was receptive to others and engaged in group discussions.   ? ?Therapeutic Modalities:   ?Cognitive Behavioral Therapy ?Feelings Identification ?Dialectical Behavioral Therapy ? ? ?Harden Mo, LCSW ?

## 2021-11-04 NOTE — Progress Notes (Signed)
Recreation Therapy Notes ? ?INPATIENT RECREATION THERAPY ASSESSMENT ? ?Patient Details ?Name: HAMDAN [name] ?MRN: [id number] ?DOB: 18-Apr-1996 ?Today's Date: 11/04/2021 ?      ?Information Obtained From: ?Patient ? ?Able to Participate in Assessment/Interview: ?Yes ? ?Patient Presentation: ?Responsive ? ?Reason for Admission (Per Patient): ?Active Symptoms ? ?Patient Stressors: ?Other (Comment) (Life) ? ?Coping Skills:   ?Art, Music ? ?Leisure Interests (2+):  ?Art - Draw, Individual - Journaling, Sports - Basketball ? ?Frequency of Recreation/Participation: ?Monthly ? ?Awareness of Community Resources:  ?No ? ?Community Resources:  ?  ? ?Current Use: ?  ? ?If no, Barriers?: ?  ? ?Expressed Interest in [employer] Information: ?No ? ?[place] of Residence:  ?[place] ? ?Patient Main Form of Transportation: ?Diplomatic Services operational officer ? ?Patient Strengths:  ?N/A ? ?Patient Identified Areas of Improvement:  ?alot ? ?Patient Goal for Hospitalization:  ?Getting everything right ? ?Current SI (including self-harm):  ?No ? ?Current HI:  ?No ? ?Current AVH: ?No ? ?Staff Intervention Plan: ?Group Attendance, Collaborate with Interdisciplinary Treatment Team ? ?Consent to Intern Participation: ?N/A ? ?Timothy [name] ?11/04/2021, 3:10 PM ?

## 2021-11-04 NOTE — Progress Notes (Signed)
Patient is quiet and reserved. Soft spoken with clear logical thinking despite his endorsement of visual hallucination.  He also endorses depression and anxiety at this encounter.  He is med compliant and received his QHS medication without incident.  Has no complaint of back pain at this time.  Will continue to monitor with q 15 minute rounds.  Encouraged pateint to seek staff with any concerncs. ? ? ? ? ?C Butler-Nicholson, LPN ?

## 2021-11-05 DIAGNOSIS — F250 Schizoaffective disorder, bipolar type: Principal | ICD-10-CM

## 2021-11-05 MED ORDER — QUETIAPINE FUMARATE 200 MG PO TABS
400.0000 mg | ORAL_TABLET | Freq: Every day | ORAL | Status: DC
  Administered 2021-11-05 – 2021-11-07 (×3): 400 mg via ORAL
  Filled 2021-11-05 (×3): qty 2

## 2021-11-05 NOTE — Plan of Care (Signed)
D- Patient alert and oriented. Patient presented in a pleasant mood on assessment stating that he slept fair last night, voicing continued complaints of lower back pain. Patient rated his pain level a "7/10", in which he did request PRN pain medication from this writer, and Tylenol was given to him and he tolerated it well. Patient denied depression, but endorsed "a little bit" of anxiety. Patient did not go into detail about why he was slightly anxious. Patient also denied SI, HI, AVH, and pain at this time. Patient did state that overall, he is feeling "good". Patient had no stated goals for today. ? ?A- Scheduled medications administered to patient, per MD orders. Support and encouragement provided.  Routine safety checks conducted every 15 minutes.  Patient informed to notify staff with problems or concerns. ? ?R- No adverse drug reactions noted. Patient contracts for safety at this time. Patient compliant with medications and treatment plan. Patient receptive, calm, and cooperative. Patient remains safe at this time. ? ?Problem: Education: ?Goal: Knowledge of [facility] Education information/materials will improve ?Outcome: Progressing ?Goal: Emotional status will improve ?Outcome: Progressing ?Goal: Mental status will improve ?Description: Patient will became more alert no confusion to place and situation  ?Outcome: Progressing ?Goal: Verbalization of understanding the information provided will improve ?Outcome: Progressing ?  ?Problem: Activity: ?Goal: Interest or engagement in activities will improve ?Outcome: Progressing ?Goal: Sleeping patterns will improve ?Outcome: Progressing ?  ?Problem: Coping: ?Goal: Ability to verbalize frustrations and anger appropriately will improve ?Description: Patient will verbalize frustration and anger to the staff appropriately.  ?Outcome: Progressing ?Goal: Ability to demonstrate self-control will improve ?Outcome: Progressing ?  ?Problem: Health  Behavior/Discharge Planning: ?Goal: Identification of resources available to assist in meeting health care needs will improve ?Outcome: Progressing ?Goal: Compliance with treatment plan for underlying cause of condition will improve ?Outcome: Progressing ?  ?Problem: Physical Regulation: ?Goal: Ability to maintain clinical measurements within normal limits will improve ?Outcome: Progressing ?  ?Problem: Safety: ?Goal: Periods of time without injury will increase ?Outcome: Progressing ?  ?Problem: Activity: ?Goal: Will verbalize the importance of balancing activity with adequate rest periods ?Outcome: Progressing ?  ?Problem: Education: ?Goal: Will be free of psychotic symptoms ?Description: Patient will be free of psychosis  ?Outcome: Progressing ?Goal: Knowledge of the prescribed therapeutic regimen will improve ?Outcome: Progressing ?  ?Problem: Coping: ?Goal: Coping ability will improve ?Outcome: Progressing ?Goal: Will verbalize feelings ?Outcome: Progressing ?  ?Problem: Health Behavior/Discharge Planning: ?Goal: Compliance with prescribed medication regimen will improve ?Outcome: Progressing ?  ?Problem: Nutritional: ?Goal: Ability to achieve adequate nutritional intake will improve ?Outcome: Progressing ?  ?Problem: Role Relationship: ?Goal: Ability to communicate needs accurately will improve ?Outcome: Progressing ?Goal: Ability to interact with others will improve ?Outcome: Progressing ?  ?Problem: Safety: ?Goal: Ability to redirect hostility and anger into socially appropriate behaviors will improve ?Outcome: Progressing ?Goal: Ability to remain free from injury will improve ?Outcome: Progressing ?  ?Problem: Self-Care: ?Goal: Ability to participate in self-care as condition permits will improve ?Outcome: Progressing ?  ?Problem: Self-Concept: ?Goal: Will verbalize positive feelings about self ?Outcome: Progressing ?  ?

## 2021-11-05 NOTE — Group Note (Signed)
[facility] LCSW Group Therapy Note ? ? ?Group Date: 11/05/2021 ?Start Time: 1400 ?End Time: 1500 ? ? ?Type of Therapy/Topic:  Group Therapy:  Balance in Life ? ?Participation Level:  Active  ? ?Description of Group:   ? This group will address the concept of balance and how it feels and looks when one is unbalanced. Patients will be encouraged to process areas in their lives that are out of balance, and identify reasons for remaining unbalanced. Facilitators will guide patients utilizing problem- solving interventions to address and correct the stressor making their life unbalanced. Understanding and applying boundaries will be explored and addressed for obtaining  and maintaining a balanced life. Patients will be encouraged to explore ways to assertively make their unbalanced needs known to significant others in their lives, using other group members and facilitator for support and feedback. ? ?Therapeutic Goals: ?Patient will identify two or more emotions or situations they have that consume much of in their lives. ?Patient will identify signs/triggers that life has become out of balance:  ?Patient will identify two ways to set boundaries in order to achieve balance in their lives:  ?Patient will demonstrate ability to communicate their needs through discussion and/or role plays ? ?Summary of Patient Progress: ?Patient was present for the entirety of the discussion. He shared that studying for a test and then forgetting what he studied or hearing people talking about him, causes him to become off balanced. He stated that exercise is one thing that helps him maintain balance. Patient appeared open and receptive to feedback and comment from facilitator and peers.  ? ? ?Therapeutic Modalities:   ?Cognitive Behavioral Therapy ?Solution-Focused Therapy ?Assertiveness Training ? ? ?[name], LCSW ?

## 2021-11-05 NOTE — Progress Notes (Signed)
Patient has been present in the milieu again today. He has attended unit groups, as well as went outside to the courtyard, with staff and other members on the unit. Patient remains safe on the unit. ?

## 2021-11-05 NOTE — Progress Notes (Signed)
[facility] ? ?11/05/2021 3:16 PM ?Timothy FeltyMarkie T [name]  ?MRN:  [id number] ?Subjective: Patient seen and chart reviewed.  Patient reports he is feeling better but then when asked directly will endorse that he still has suicidal thoughts and hears voices.  Not acting out.  Staying pretty much isolated to himself much of the time.  Tolerating medicine adequately. ?Principal Problem: Schizoaffective disorder, bipolar type (HCC) ?Diagnosis: Principal Problem: ?  Schizoaffective disorder, bipolar type (HCC) ? ?Total Time spent with patient: 30 minutes ? ?Past Psychiatric History: Past history of schizophrenia schizoaffective disorder recurrent depression ? ?Past Medical History:  ?Past Medical History:  ?Diagnosis Date  ? Cannabis abuse   ? Paranoid schizophrenia (HCC)   ? Schizoaffective disorder, depressive type (HCC) 09/17/2014  ? History reviewed. No pertinent surgical history. ?Family History:  ?Family History  ?Problem Relation Age of Onset  ? Mental illness Brother   ? Drug abuse Brother   ? Mental illness Cousin   ? Suicidality Cousin   ? Diabetes Maternal Grandmother   ? ?Family Psychiatric  History: See previous ?Social History:  ?Social History  ? ?Substance and Sexual Activity  ?Alcohol Use Yes  ? Alcohol/week: 2.0 standard drinks  ? Types: 2 Cans of beer per week  ?   ?Social History  ? ?Substance and Sexual Activity  ?Drug Use Yes  ? 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  ?Social History  ? ?Socioeconomic History  ? Marital status: Single  ?  Spouse name: Not on file  ? Number of children: Not on file  ? Years of education: Not on file  ? Highest education level: Not on file  ?Occupational History  ? Not on file  ?Tobacco Use  ? Smoking status: Some Days  ?  Packs/day: 0.25  ?  Types: Cigarettes  ? Smokeless tobacco: Never  ?Vaping Use  ? Vaping Use: Never used  ?Substance and Sexual Activity  ? Alcohol use: Yes  ?  Alcohol/week: 2.0 standard drinks  ?  Types: 2 Cans of beer per week  ? Drug use: Yes  ?   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 Sexual activity: Yes  ?Other Topics Concern  ? Not on file  ?Social History Narrative  ? Not on file  ? ?Social Determinants of Health  ? ?Financial Resource Strain: Not on file  ?Food Insecurity: Not on file  ?Transportation Needs: Not on file  ?Physical Activity: Not on file  ?Stress: Not on file  ?Social Connections: Not on file  ? ?Additional Social History:  ?  ?  ?  ?  ?  ?  ?  ?  ?  ?  ?  ? ?Sleep: Fair ? ?Appetite:  Fair ? ?Current Medications: ?Current Facility-Administered Medications  ?Medication Dose Route Frequency Provider Last Rate Last Admin  ? acetaminophen (TYLENOL) tablet 650 mg  650 mg Oral Q6H PRN Charm RingsLord, Jamison Y, NP   650 mg at 11/05/21 [id number]  ? alum & mag hydroxide-simeth (MAALOX/MYLANTA) 200-200-20 MG/5ML suspension 30 mL  30 mL Oral Q4H PRN Charm RingsLord, Jamison Y, NP      ? divalproex (DEPAKOTE) DR tablet 500 mg  500 mg Oral Q12H Charm RingsLord, Jamison Y, NP   500 mg at 11/05/21 [id number]  ? FLUoxetine (PROZAC) capsule 20 mg  20 mg Oral Daily Charm RingsLord, Jamison Y, NP   20 mg at 11/05/21 [id number]  ? magnesium hydroxide (MILK OF MAGNESIA) suspension 30 mL  30 mL Oral Daily PRN Charm RingsLord, Jamison Y, NP      ?  nicotine (NICODERM CQ - dosed in mg/24 hours) patch 14 mg  14 mg Transdermal Daily Toia Micale, Jackquline Denmark, MD   14 mg at 11/05/21 0836  ? QUEtiapine (SEROQUEL) tablet 300 mg  300 mg Oral QHS Wacey Zieger, Jackquline Denmark, MD   300 mg at 11/04/21 2106  ? QUEtiapine (SEROQUEL) tablet 50 mg  50 mg Oral BID Charm Rings, NP   50 mg at 11/05/21 5456  ? ? ?Lab Results:  ?Results for orders placed or performed during the hospital encounter of 11/01/21 (from the past 48 hour(s))  ?Hemoglobin A1c     Status: None  ? Collection Time: 11/04/21  7:29 AM  ?Result Value Ref Range  ? Hgb A1c MFr Bld 5.5 4.8 - 5.6 %  ?  Comment: (NOTE) ?Pre diabetes:          5.7%-6.4% ? ?Diabetes:              >6.4% ? ?Glycemic control for   <7.0% ?adults with diabetes ?  ? Mean Plasma Glucose 111.15 mg/dL  ?  Comment: Performed at [facility], [address], [place], [place] [address]  ?Lipid panel     Status: Abnormal  ? Collection Time: 11/04/21  7:29 AM  ?Result Value Ref Range  ? Cholesterol 149 0 - 200 mg/dL  ? Triglycerides 56 <150 mg/dL  ? HDL 35 (L) >40 mg/dL  ? Total CHOL/HDL Ratio 4.3 RATIO  ? VLDL 11 0 - 40 mg/dL  ? LDL Cholesterol 103 (H) 0 - 99 mg/dL  ?  Comment:        ?Total Cholesterol/HDL:CHD Risk ?Coronary Heart Disease Risk Table ?                    Men   Women ? 1/2 Average Risk   3.4   3.3 ? Average Risk       5.0   4.4 ? 2 X Average Risk   9.6   7.1 ? 3 X Average Risk  23.4   11.0 ?       ?Use the calculated Patient Ratio ?above and the CHD Risk Table ?to determine the patient's CHD Risk. ?       ?ATP III CLASSIFICATION (LDL): ? <100     mg/dL   Optimal ? 937-342  mg/dL   Near or Above ?                   Optimal ? 130-159  mg/dL   Borderline ? 160-189  mg/dL   High ? >876     mg/dL   Very High ?Performed at [facility], [address], [place], [place] [address] ?  ? ? ?Blood Alcohol level:  ?Lab Results  ?Component Value Date  ? ETH <10 10/31/2021  ? ETH <10 06/30/2021  ? ? ?Metabolic Disorder Labs: ?Lab Results  ?Component Value Date  ? HGBA1C 5.5 11/04/2021  ? MPG 111.15 11/04/2021  ? MPG 114 06/17/2021  ? ?Lab Results  ?Component Value Date  ? PROLACTIN 33.2 (H) 08/15/2016  ? ?Lab Results  ?Component Value Date  ? CHOL 149 11/04/2021  ? TRIG 56 11/04/2021  ? HDL 35 (L) 11/04/2021  ? CHOLHDL 4.3 11/04/2021  ? VLDL 11 11/04/2021  ? LDLCALC 103 (H) 11/04/2021  ? LDLCALC 111 (H) 06/17/2021  ? ? ?Physical Findings: ?AIMS:  , ,  ,  ,    ?CIWA:    ?COWS:    ? ?  Musculoskeletal: ?Strength & Muscle Tone: within normal limits ?Gait & Station: normal ?Patient leans: N/A ? ?Psychiatric Specialty Exam: ? ?Presentation  ?General Appearance: Appropriate for Environment ? ?Eye Contact:Fair ? ?Speech:Normal Rate ? ?Speech Volume:Normal ? ?Handedness:Right ? ? ?Mood and Affect   ?Mood:Euthymic ? ?Affect:Appropriate ? ? ?Thought Process  ?Thought Processes:Coherent ? ?Descriptions of Associations:Intact ? ?Orientation:Full (Time, Place and Person) ? ?Thought Content:Logical ? ?History of Schizophrenia/Schizoaffective disorder:Yes ? ?Duration of Psychotic Symptoms:Greater than six months ? ?Hallucinations:No data recorded ?Ideas of Reference:None ? ?Suicidal Thoughts:No data recorded ?Homicidal Thoughts:No data recorded ? ?Sensorium  ?Memory:Immediate Good; Recent Good; Remote Good ? ?Judgment:Fair ? ?Insight:Fair ? ? ?Executive Functions  ?Concentration:Good ? ?Attention Span:Good ? ?Recall:Fair ? ?Fund of Knowledge:Fair ? ?Language:Good ? ? ?Psychomotor Activity  ?Psychomotor Activity:No data recorded ? ?Assets  ?Assets:Communication Skills; Desire for Improvement; Financial Resources/Insurance; Resilience; Social Support ? ? ?Sleep  ?Sleep:No data recorded ? ? ?Physical Exam: ?Physical Exam ?Vitals and nursing note reviewed.  ?Constitutional:   ?   Appearance: Normal appearance.  ?HENT:  ?   Head: Normocephalic and atraumatic.  ?   Mouth/Throat:  ?   Pharynx: Oropharynx is clear.  ?Eyes:  ?   Pupils: Pupils are equal, round, and reactive to light.  ?Cardiovascular:  ?   Rate and Rhythm: Normal rate and regular rhythm.  ?Pulmonary:  ?   Effort: Pulmonary effort is normal.  ?   Breath sounds: Normal breath sounds.  ?Abdominal:  ?   General: Abdomen is flat.  ?   Palpations: Abdomen is soft.  ?Musculoskeletal:     ?   General: Normal range of motion.  ?Skin: ?   General: Skin is warm and dry.  ?Neurological:  ?   General: No focal deficit present.  ?   Mental Status: He is alert. Mental status is at baseline.  ?Psychiatric:     ?   Attention and Perception: He is inattentive. He perceives auditory hallucinations.     ?   Mood and Affect: Mood is depressed. Affect is blunt.     ?   Speech: Speech is delayed.     ?   Behavior: Behavior is slowed.     ?   Thought Content: Thought content  normal.     ?   Cognition and Memory: Cognition normal.     ?   Judgment: Judgment normal.  ? ?Review of Systems  ?Constitutional: Negative.   ?HENT: Negative.    ?Eyes: Negative.   ?Respiratory: Negative.    ?Cardiovascular: Negative.   ?Gastrointestinal

## 2021-11-06 DIAGNOSIS — F250 Schizoaffective disorder, bipolar type: Secondary | ICD-10-CM | POA: Diagnosis not present

## 2021-11-06 MED ORDER — IBUPROFEN 600 MG PO TABS
600.0000 mg | ORAL_TABLET | Freq: Four times a day (QID) | ORAL | Status: DC | PRN
Start: 2021-11-06 — End: 2021-12-01
  Administered 2021-11-09 – 2021-11-26 (×6): 600 mg via ORAL
  Filled 2021-11-06 (×8): qty 1

## 2021-11-06 NOTE — Plan of Care (Signed)
?  Problem: Education: ?Goal: Knowledge of [facility] Education information/materials will improve ?Outcome: Progressing ?Goal: Emotional status will improve ?Outcome: Progressing ?Goal: Mental status will improve ?Description: Patient will became more alert no confusion to place and situation  ?Outcome: Progressing ?Goal: Verbalization of understanding the information provided will improve ?Outcome: Progressing ?  ?Problem: Activity: ?Goal: Interest or engagement in activities will improve ?Outcome: Progressing ?Goal: Sleeping patterns will improve ?Outcome: Progressing ?  ?Problem: Coping: ?Goal: Ability to verbalize frustrations and anger appropriately will improve ?Description: Patient will verbalize frustration and anger to the staff appropriately.  ?Outcome: Progressing ?Goal: Ability to demonstrate self-control will improve ?Outcome: Progressing ?  ?Problem: Health Behavior/Discharge Planning: ?Goal: Identification of resources available to assist in meeting health care needs will improve ?Outcome: Progressing ?Goal: Compliance with treatment plan for underlying cause of condition will improve ?Outcome: Progressing ?  ?Problem: Physical Regulation: ?Goal: Ability to maintain clinical measurements within normal limits will improve ?Outcome: Progressing ?  ?Problem: Safety: ?Goal: Periods of time without injury will increase ?Outcome: Progressing ?  ?Problem: Activity: ?Goal: Will verbalize the importance of balancing activity with adequate rest periods ?Outcome: Progressing ?  ?

## 2021-11-06 NOTE — Progress Notes (Signed)
[facility] ? ?11/06/2021 3:04 PM ?Timothy FeltyMarkie T [name]  ?MRN:  [id number] ?Subjective: Follow-up patient with schizoaffective disorder.  Patient is up out of his room more interacting with others more physically active.  Sat down with me and made good eye contact but also continues to endorse feeling depressed and having suicidal thoughts and auditory hallucinations.  Tolerating medicine with no complaints.  Has chronic low back pain of unclear origin ?Principal Problem: Schizoaffective disorder, bipolar type (HCC) ?Diagnosis: Principal Problem: ?  Schizoaffective disorder, bipolar type (HCC) ? ?Total Time spent with patient: 30 minutes ? ?Past Psychiatric History: Past history of schizoaffective disorder with repeated depression ? ?Past Medical History:  ?Past Medical History:  ?Diagnosis Date  ? Cannabis abuse   ? Paranoid schizophrenia (HCC)   ? Schizoaffective disorder, depressive type (HCC) 09/17/2014  ? History reviewed. No pertinent surgical history. ?Family History:  ?Family History  ?Problem Relation Age of Onset  ? Mental illness Brother   ? Drug abuse Brother   ? Mental illness Cousin   ? Suicidality Cousin   ? Diabetes Maternal Grandmother   ? ?Family Psychiatric  History: See previous ?Social History:  ?Social History  ? ?Substance and Sexual Activity  ?Alcohol Use Yes  ? Alcohol/week: 2.0 standard drinks  ? Types: 2 Cans of beer per week  ?   ?Social History  ? ?Substance and Sexual Activity  ?Drug Use Yes  ? 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  ?Social History  ? ?Socioeconomic History  ? Marital status: Single  ?  Spouse name: Not on file  ? Number of children: Not on file  ? Years of education: Not on file  ? Highest education level: Not on file  ?Occupational History  ? Not on file  ?Tobacco Use  ? Smoking status: Some Days  ?  Packs/day: 0.25  ?  Types: Cigarettes  ? Smokeless tobacco: Never  ?Vaping Use  ? Vaping Use: Never used  ?Substance and Sexual Activity  ? Alcohol use: Yes  ?   Alcohol/week: 2.0 standard drinks  ?  Types: 2 Cans of beer per week  ? Drug use: Yes  ?  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 Sexual activity: Yes  ?Other Topics Concern  ? Not on file  ?Social History Narrative  ? Not on file  ? ?Social Determinants of Health  ? ?Financial Resource Strain: Not on file  ?Food Insecurity: Not on file  ?Transportation Needs: Not on file  ?Physical Activity: Not on file  ?Stress: Not on file  ?Social Connections: Not on file  ? ?Additional Social History:  ?  ?  ?  ?  ?  ?  ?  ?  ?  ?  ?  ? ?Sleep: Fair ? ?Appetite:  Fair ? ?Current Medications: ?Current Facility-Administered Medications  ?Medication Dose Route Frequency Provider Last Rate Last Admin  ? acetaminophen (TYLENOL) tablet 650 mg  650 mg Oral Q6H PRN Charm RingsLord, Jamison Y, NP   650 mg at 11/06/21 0744  ? alum & mag hydroxide-simeth (MAALOX/MYLANTA) 200-200-20 MG/5ML suspension 30 mL  30 mL Oral Q4H PRN Charm RingsLord, Jamison Y, NP      ? divalproex (DEPAKOTE) DR tablet 500 mg  500 mg Oral Q12H Charm RingsLord, Jamison Y, NP   500 mg at 11/06/21 0744  ? FLUoxetine (PROZAC) capsule 20 mg  20 mg Oral Daily Charm RingsLord, Jamison Y, NP   20 mg at 11/06/21 0744  ? ibuprofen (ADVIL) tablet 600 mg  600  mg Oral Q6H PRN Taressa Rauh, Jackquline Denmark, MD      ? magnesium hydroxide (MILK OF MAGNESIA) suspension 30 mL  30 mL Oral Daily PRN Charm Rings, NP      ? nicotine (NICODERM CQ - dosed in mg/24 hours) patch 14 mg  14 mg Transdermal Daily Annissa Andreoni, Jackquline Denmark, MD   14 mg at 11/06/21 0744  ? QUEtiapine (SEROQUEL) tablet 400 mg  400 mg Oral QHS Ebrahim Deremer, Jackquline Denmark, MD   400 mg at 11/05/21 2137  ? QUEtiapine (SEROQUEL) tablet 50 mg  50 mg Oral BID Charm Rings, NP   50 mg at 11/06/21 7062  ? ? ?Lab Results: No results found for this or any previous visit (from the past 48 hour(s)). ? ?Blood Alcohol level:  ?Lab Results  ?Component Value Date  ? ETH <10 10/31/2021  ? ETH <10 06/30/2021  ? ? ?Metabolic Disorder Labs: ?Lab Results  ?Component Value Date  ? HGBA1C 5.5 11/04/2021  ? MPG  111.15 11/04/2021  ? MPG 114 06/17/2021  ? ?Lab Results  ?Component Value Date  ? PROLACTIN 33.2 (H) 08/15/2016  ? ?Lab Results  ?Component Value Date  ? CHOL 149 11/04/2021  ? TRIG 56 11/04/2021  ? HDL 35 (L) 11/04/2021  ? CHOLHDL 4.3 11/04/2021  ? VLDL 11 11/04/2021  ? LDLCALC 103 (H) 11/04/2021  ? LDLCALC 111 (H) 06/17/2021  ? ? ?Physical Findings: ?AIMS:  , ,  ,  ,    ?CIWA:    ?COWS:    ? ?Musculoskeletal: ?Strength & Muscle Tone: within normal limits ?Gait & Station: normal ?Patient leans: N/A ? ?Psychiatric Specialty Exam: ? ?Presentation  ?General Appearance: Appropriate for Environment ? ?Eye Contact:Fair ? ?Speech:Normal Rate ? ?Speech Volume:Normal ? ?Handedness:Right ? ? ?Mood and Affect  ?Mood:Euthymic ? ?Affect:Appropriate ? ? ?Thought Process  ?Thought Processes:Coherent ? ?Descriptions of Associations:Intact ? ?Orientation:Full (Time, Place and Person) ? ?Thought Content:Logical ? ?History of Schizophrenia/Schizoaffective disorder:Yes ? ?Duration of Psychotic Symptoms:Greater than six months ? ?Hallucinations:No data recorded ?Ideas of Reference:None ? ?Suicidal Thoughts:No data recorded ?Homicidal Thoughts:No data recorded ? ?Sensorium  ?Memory:Immediate Good; Recent Good; Remote Good ? ?Judgment:Fair ? ?Insight:Fair ? ? ?Executive Functions  ?Concentration:Good ? ?Attention Span:Good ? ?Recall:Fair ? ?Fund of Knowledge:Fair ? ?Language:Good ? ? ?Psychomotor Activity  ?Psychomotor Activity:No data recorded ? ?Assets  ?Assets:Communication Skills; Desire for Improvement; Financial Resources/Insurance; Resilience; Social Support ? ? ?Sleep  ?Sleep:No data recorded ? ? ?Physical Exam: ?Physical Exam ?Vitals and nursing note reviewed.  ?Constitutional:   ?   Appearance: Normal appearance.  ?HENT:  ?   Head: Normocephalic and atraumatic.  ?   Mouth/Throat:  ?   Pharynx: Oropharynx is clear.  ?Eyes:  ?   Pupils: Pupils are equal, round, and reactive to light.  ?Cardiovascular:  ?   Rate and Rhythm: Normal  rate and regular rhythm.  ?Pulmonary:  ?   Effort: Pulmonary effort is normal.  ?   Breath sounds: Normal breath sounds.  ?Abdominal:  ?   General: Abdomen is flat.  ?   Palpations: Abdomen is soft.  ?Musculoskeletal:     ?   General: Normal range of motion.  ?Skin: ?   General: Skin is warm and dry.  ?Neurological:  ?   General: No focal deficit present.  ?   Mental Status: He is alert. Mental status is at baseline.  ?Psychiatric:     ?   Attention and Perception: He is inattentive. He perceives auditory hallucinations.     ?  Mood and Affect: Mood is depressed. Affect is blunt.     ?   Speech: Speech is delayed.     ?   Behavior: Behavior is slowed.     ?   Thought Content: Thought content includes suicidal ideation. Thought content does not include suicidal plan.     ?   Cognition and Memory: Cognition is impaired.  ? ?Review of Systems  ?Constitutional: Negative.   ?HENT: Negative.    ?Eyes: Negative.   ?Respiratory: Negative.    ?Cardiovascular: Negative.   ?Gastrointestinal: Negative.   ?Musculoskeletal: Negative.   ?Skin: Negative.   ?Neurological: Negative.   ?Psychiatric/Behavioral:  Positive for depression, hallucinations and suicidal ideas.   ?Blood pressure 117/67, pulse 75, temperature 98.2 ?F (36.8 ?C), temperature source Oral, resp. rate 20, height 6\' 3"  (1.905 m), weight 93.4 kg, SpO2 100 %. Body mass index is 25.74 kg/m?. ? ? ?Treatment Plan Summary: ?Medication management and Plan continue medication for depression and psychosis.  Encourage him to be up out of bed interacting.  Added Motrin in addition to his Tylenol to help with the chronic back pain. ? ? , MD ?11/06/2021, 3:04 PM ? ?

## 2021-11-06 NOTE — Group Note (Signed)
[facility] LCSW Group Therapy Note ? ? ?Group Date: 11/06/2021 ?Start Time: 1300 ?End Time: 1400 ? ?Type of Therapy and Topic:  Group Therapy:  Feelings around Relapse and Recovery ? ?Participation Level:  Minimal  ? ?Mood: guarded   ? ?Description of Group:   ? Patients in this group will discuss emotions they experience before and after a relapse. They will process how experiencing these feelings, or avoidance of experiencing them, relates to having a relapse. Facilitator will guide patients to explore emotions they have related to recovery. Patients will be encouraged to process which emotions are more powerful. They will be guided to discuss the emotional reaction significant others in their lives may have to patients? relapse or recovery. Patients will be assisted in exploring ways to respond to the emotions of others without this contributing to a relapse. ? ?Therapeutic Goals: ?Patient will identify two or more emotions that lead to relapse for them:  ?Patient will identify two emotions that result when they relapse:  ?Patient will identify two emotions related to recovery:  ?Patient will demonstrate ability to communicate their needs through discussion and/or role plays. ? ? ?Summary of Patient Progress: ? ?Patient was present for the entirety of group session. Patient participated in opening and closing remarks. However, patient did not contribute at all to the topic of discussion despite encouraged participation.  ? ? ?Therapeutic Modalities:   ?Cognitive Behavioral Therapy ?Solution-Focused Therapy ?Assertiveness Training ?Relapse Prevention Therapy ? ? ?Corky Crafts, LCSWA ?

## 2021-11-06 NOTE — Progress Notes (Signed)
Patient is now back inside, in the dayroom, playing cards with other members on the unit. Patient has not had any issues interacting with other patients thus far. Patient remains safe on the unit. ?

## 2021-11-06 NOTE — Progress Notes (Signed)
Recreation Therapy Notes ? ?Date: 11/06/2021 ? ?Time: 10:40 am   ? ?Location: Craft room  ? ?Behavioral response: Appropriate ? ?Intervention Topic:  Stress Management   ? ?Discussion/Intervention:  ?Group content on today was focused on stress. The group defined stress and way to cope with stress. Participants expressed how they know when they are stresses out. Individuals described the different ways they have to cope with stress. The group stated reasons why it is important to cope with stress. Patient explained what good stress is and some examples. The group participated in the intervention ?Stress Management?. Individuals were separated into two group and answered questions related to stress.  ?Clinical Observations/Feedback: ?Patient came to group and identified music and video games as a way he manages his stress. He expressed that stress management is important to help control your reactions. Individual was social with peers and staff while participating in the intervention.    ?Tramond Slinker LRT/CTRS  ? ? ? ? ? ? ? ? ?Timothy [name] ?11/06/2021 12:38 PM ?

## 2021-11-06 NOTE — Progress Notes (Signed)
Patient is outside, in the courtyard, playing basketball with other members on the unit. Patient remains safe on the unit. ?

## 2021-11-06 NOTE — Plan of Care (Signed)
D- Patient alert and oriented. Patient presented in a pleasant mood on assessment stating that he slept "pretty good" last night and had complaints of lower back pain again today. Patient rated his pain level an "8/10", in which he requested PRN pain medication from this writer. Patient continues to endorse "a little bit" of depression and anxiety, but could not voice what was making him feel this way. Patient also endorsed both visual and auditory hallucinations, reporting that he can't explain to this writer what he's hearing and seeing. This is the first time patient has verbalized hallucinations to this Probation officer. Patient denies SI/HI at this time. Patient had no stated goals for today. ? ?A- Scheduled medications administered to patient, per MD orders. Support and encouragement provided.  Routine safety checks conducted every 15 minutes.  Patient informed to notify staff with problems or concerns. ? ?R- No adverse drug reactions noted. Patient contracts for safety at this time. Patient compliant with medications and treatment plan. Patient receptive, calm, and cooperative. Patient remains safe at this time. ? ?Problem: Education: ?Goal: Knowledge of [facility] [facility] Education information/materials will improve ?Outcome: Not Progressing ?Goal: Emotional status will improve ?Outcome: Not Progressing ?Goal: Mental status will improve ?Description: Patient will became more alert no confusion to place and situation  ?Outcome: Not Progressing ?Goal: Verbalization of understanding the information provided will improve ?Outcome: Not Progressing ?  ?Problem: Activity: ?Goal: Interest or engagement in activities will improve ?Outcome: Not Progressing ?Goal: Sleeping patterns will improve ?Outcome: Not Progressing ?  ?Problem: Coping: ?Goal: Ability to verbalize frustrations and anger appropriately will improve ?Description: Patient will verbalize frustration and anger to the staff appropriately.  ?Outcome: Not  Progressing ?Goal: Ability to demonstrate self-control will improve ?Outcome: Not Progressing ?  ?Problem: Health Behavior/Discharge Planning: ?Goal: Identification of resources available to assist in meeting health care needs will improve ?Outcome: Not Progressing ?Goal: Compliance with treatment plan for underlying cause of condition will improve ?Outcome: Not Progressing ?  ?Problem: Physical Regulation: ?Goal: Ability to maintain clinical measurements within normal limits will improve ?Outcome: Not Progressing ?  ?Problem: Safety: ?Goal: Periods of time without injury will increase ?Outcome: Not Progressing ?  ?Problem: Activity: ?Goal: Will verbalize the importance of balancing activity with adequate rest periods ?Outcome: Not Progressing ?  ?Problem: Education: ?Goal: Will be free of psychotic symptoms ?Description: Patient will be free of psychosis  ?Outcome: Not Progressing ?Goal: Knowledge of the prescribed therapeutic regimen will improve ?Outcome: Not Progressing ?  ?Problem: Coping: ?Goal: Coping ability will improve ?Outcome: Not Progressing ?Goal: Will verbalize feelings ?Outcome: Not Progressing ?  ?Problem: Health Behavior/Discharge Planning: ?Goal: Compliance with prescribed medication regimen will improve ?Outcome: Not Progressing ?  ?Problem: Nutritional: ?Goal: Ability to achieve adequate nutritional intake will improve ?Outcome: Not Progressing ?  ?Problem: Role Relationship: ?Goal: Ability to communicate needs accurately will improve ?Outcome: Not Progressing ?Goal: Ability to interact with others will improve ?Outcome: Not Progressing ?  ?Problem: Safety: ?Goal: Ability to redirect hostility and anger into socially appropriate behaviors will improve ?Outcome: Not Progressing ?Goal: Ability to remain free from injury will improve ?Outcome: Not Progressing ?  ?Problem: Self-Care: ?Goal: Ability to participate in self-care as condition permits will improve ?Outcome: Not Progressing ?  ?Problem:  Self-Concept: ?Goal: Will verbalize positive feelings about self ?Outcome: Not Progressing ?  ?

## 2021-11-06 NOTE — Progress Notes (Signed)
Patient is pleasant.  Much more visible in the dayroom watching tv and hanging out with his peers.  He is med compliant and took meds without incident.  He denies si hi avh at this encounter but continues to endorse depression and anxiety. Has back pain but rates his pain at 1/10 at this encounter.  Will continue to monitor with q15 minute safety rounds.   ? ? ? ? ? ?C Butler-Nicholson, LPN ?

## 2021-11-07 DIAGNOSIS — F250 Schizoaffective disorder, bipolar type: Secondary | ICD-10-CM | POA: Diagnosis not present

## 2021-11-07 NOTE — Group Note (Signed)
LCSW Group Therapy Note ? ?Group Date: 11/07/2021 ?Start Time: 1310 ?End Time: 1400 ? ? ?Type of Therapy and Topic:  Group Therapy - Healthy vs Unhealthy Coping Skills ? ?Participation Level:  Active  ? ?Description of Group ?The focus of this group was to determine what unhealthy coping techniques typically are used by group members and what healthy coping techniques would be helpful in coping with various problems. Patients were guided in becoming aware of the differences between healthy and unhealthy coping techniques. Patients were asked to identify 2-3 healthy coping skills they would like to learn to use more effectively. ? ?Therapeutic Goals ?Patients learned that coping is what human beings do all day long to deal with various situations in their lives ?Patients defined and discussed healthy vs unhealthy coping techniques ?Patients identified their preferred coping techniques and identified whether these were healthy or unhealthy ?Patients determined 2-3 healthy coping skills they would like to become more familiar with and use more often. ?Patients provided support and ideas to each other ? ? ?Summary of Patient Progress:  Patient was present for the duration of group and participated in discussion of the topic. Patient identified drawing, collecting cars, and cutting grass and bushes as healthy coping skills. Patient stated that he wants to focus on himself in the future.  ? ? ?Therapeutic Modalities ?Cognitive Behavioral Therapy ?Motivational Interviewing ? ?Ileana Ladd Brandt, LCSWA ?11/07/2021  4:20 PM   ?

## 2021-11-07 NOTE — Progress Notes (Signed)
[facility] ? ?11/07/2021 2:58 PM ?Timothy [name]  ?MRN:  [id number] ?Subjective: Patient seen and chart reviewed.  Patient says he is still feeling depressed and having suicidal thoughts without specific plan.  Still having hallucinations.  He was able to discuss with me how some of his sadness is around what he perceives to be a bad relationship with his family whom he describes as being rejecting of him.  Behavior here on the unit is subdued but not aggressive no evidence of attempts to harm himself and generally cooperative. ?Principal Problem: Schizoaffective disorder, bipolar type (HCC) ?Diagnosis: Principal Problem: ?  Schizoaffective disorder, bipolar type (HCC) ? ?Total Time spent with patient: 30 minutes ? ?Past Psychiatric History: Past history of schizoaffective disorder recurrent depression ? ?Past Medical History:  ?Past Medical History:  ?Diagnosis Date  ? Cannabis abuse   ? Paranoid schizophrenia (HCC)   ? Schizoaffective disorder, depressive type (HCC) 09/17/2014  ? History reviewed. No pertinent surgical history. ?Family History:  ?Family History  ?Problem Relation Age of Onset  ? Mental illness Brother   ? Drug abuse Brother   ? Mental illness Cousin   ? Suicidality Cousin   ? Diabetes Maternal Grandmother   ? ?Family Psychiatric  History: See previous ?Social History:  ?Social History  ? ?Substance and Sexual Activity  ?Alcohol Use Yes  ? Alcohol/week: 2.0 standard drinks  ? Types: 2 Cans of beer per week  ?   ?Social History  ? ?Substance and Sexual Activity  ?Drug Use Yes  ? 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  ?Social History  ? ?Socioeconomic History  ? Marital status: Single  ?  Spouse name: Not on file  ? Number of children: Not on file  ? Years of education: Not on file  ? Highest education level: Not on file  ?Occupational History  ? Not on file  ?Tobacco Use  ? Smoking status: Some Days  ?  Packs/day: 0.25  ?  Types: Cigarettes  ? Smokeless tobacco: Never  ?Vaping Use  ? Vaping Use:  Never used  ?Substance and Sexual Activity  ? Alcohol use: Yes  ?  Alcohol/week: 2.0 standard drinks  ?  Types: 2 Cans of beer per week  ? Drug use: Yes  ?  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 Sexual activity: Yes  ?Other Topics Concern  ? Not on file  ?Social History Narrative  ? Not on file  ? ?Social Determinants of Health  ? ?Financial Resource Strain: Not on file  ?Food Insecurity: Not on file  ?Transportation Needs: Not on file  ?Physical Activity: Not on file  ?Stress: Not on file  ?Social Connections: Not on file  ? ?Additional Social History:  ?  ?  ?  ?  ?  ?  ?  ?  ?  ?  ?  ? ?Sleep: Fair ? ?Appetite:  Fair ? ?Current Medications: ?Current Facility-Administered Medications  ?Medication Dose Route Frequency Provider Last Rate Last Admin  ? acetaminophen (TYLENOL) tablet 650 mg  650 mg Oral Q6H PRN Charm RingsLord, Jamison Y, NP   650 mg at 11/07/21 0818  ? alum & mag hydroxide-simeth (MAALOX/MYLANTA) 200-200-20 MG/5ML suspension 30 mL  30 mL Oral Q4H PRN Charm RingsLord, Jamison Y, NP      ? divalproex (DEPAKOTE) DR tablet 500 mg  500 mg Oral Q12H Charm RingsLord, Jamison Y, NP   500 mg at 11/07/21 0818  ? FLUoxetine (PROZAC) capsule 20 mg  20 mg Oral Daily Lord,  Asa Saunas, NP   20 mg at 11/07/21 0818  ? ibuprofen (ADVIL) tablet 600 mg  600 mg Oral Q6H PRN Kiylah Loyer, Madie Reno, MD      ? magnesium hydroxide (MILK OF MAGNESIA) suspension 30 mL  30 mL Oral Daily PRN Patrecia Pour, NP      ? nicotine (NICODERM CQ - dosed in mg/24 hours) patch 14 mg  14 mg Transdermal Daily Everton Bertha, Madie Reno, MD   14 mg at 11/07/21 0818  ? QUEtiapine (SEROQUEL) tablet 400 mg  400 mg Oral QHS Sabas Frett, Madie Reno, MD   400 mg at 11/06/21 2116  ? QUEtiapine (SEROQUEL) tablet 50 mg  50 mg Oral BID Patrecia Pour, NP   50 mg at 11/07/21 [id number]  ? ? ?Lab Results: No results found for this or any previous visit (from the past 48 hour(s)). ? ?Blood Alcohol level:  ?Lab Results  ?Component Value Date  ? ETH <10 10/31/2021  ? ETH <10 06/30/2021  ? ? ?Metabolic Disorder  Labs: ?Lab Results  ?Component Value Date  ? HGBA1C 5.5 11/04/2021  ? MPG 111.15 11/04/2021  ? MPG 114 06/17/2021  ? ?Lab Results  ?Component Value Date  ? PROLACTIN 33.2 (H) 08/15/2016  ? ?Lab Results  ?Component Value Date  ? CHOL 149 11/04/2021  ? TRIG 56 11/04/2021  ? HDL 35 (L) 11/04/2021  ? CHOLHDL 4.3 11/04/2021  ? VLDL 11 11/04/2021  ? LDLCALC 103 (H) 11/04/2021  ? LDLCALC 111 (H) 06/17/2021  ? ? ?Physical Findings: ?AIMS:  , ,  ,  ,    ?CIWA:    ?COWS:    ? ?Musculoskeletal: ?Strength & Muscle Tone: within normal limits ?Gait & Station: normal ?Patient leans: N/A ? ?Psychiatric Specialty Exam: ? ?Presentation  ?General Appearance: Appropriate for Environment ? ?Eye Contact:Fair ? ?Speech:Normal Rate ? ?Speech Volume:Normal ? ?Handedness:Right ? ? ?Mood and Affect  ?Mood:Euthymic ? ?Affect:Appropriate ? ? ?Thought Process  ?Thought Processes:Coherent ? ?Descriptions of Associations:Intact ? ?Orientation:Full (Time, Place and Person) ? ?Thought Content:Logical ? ?History of Schizophrenia/Schizoaffective disorder:Yes ? ?Duration of Psychotic Symptoms:Greater than six months ? ?Hallucinations:No data recorded ?Ideas of Reference:None ? ?Suicidal Thoughts:No data recorded ?Homicidal Thoughts:No data recorded ? ?Sensorium  ?Memory:Immediate Good; Recent Good; Remote Good ? ?Judgment:Fair ? ?Insight:Fair ? ? ?Executive Functions  ?Concentration:Good ? ?Attention Span:Good ? ?Recall:Fair ? ?[place] ? ?Language:Good ? ? ?Psychomotor Activity  ?Psychomotor Activity:No data recorded ? ?Assets  ?Assets:Communication Skills; Desire for Improvement; Financial Resources/Insurance; Resilience; Social Support ? ? ?Sleep  ?Sleep:No data recorded ? ? ?Physical Exam: ?Physical Exam ?Vitals and nursing note reviewed.  ?Constitutional:   ?   Appearance: Normal appearance.  ?HENT:  ?   Head: Normocephalic and atraumatic.  ?   Mouth/Throat:  ?   Pharynx: Oropharynx is clear.  ?Eyes:  ?   Pupils: Pupils are equal,  round, and reactive to light.  ?Cardiovascular:  ?   Rate and Rhythm: Normal rate and regular rhythm.  ?Pulmonary:  ?   Effort: Pulmonary effort is normal.  ?   Breath sounds: Normal breath sounds.  ?Abdominal:  ?   General: Abdomen is flat.  ?   Palpations: Abdomen is soft.  ?Musculoskeletal:     ?   General: Normal range of motion.  ?Skin: ?   General: Skin is warm and dry.  ?Neurological:  ?   General: No focal deficit present.  ?   Mental Status: He is alert. Mental status is at baseline.  ?  Psychiatric:     ?   Attention and Perception: Attention normal. He perceives auditory hallucinations.     ?   Mood and Affect: Mood is depressed. Affect is blunt.     ?   Speech: Speech is delayed.     ?   Behavior: Behavior is slowed.     ?   Thought Content: Thought content includes suicidal ideation.  ? ?Review of Systems  ?Constitutional: Negative.   ?HENT: Negative.    ?Eyes: Negative.   ?Respiratory: Negative.    ?Cardiovascular: Negative.   ?Gastrointestinal: Negative.   ?Musculoskeletal: Negative.   ?Skin: Negative.   ?Neurological: Negative.   ?Psychiatric/Behavioral:  Positive for depression, hallucinations and suicidal ideas. The patient is nervous/anxious.   ?Blood pressure 120/84, pulse 67, temperature 98 ?F (36.7 ?C), temperature source Oral, resp. rate 18, height 6\' 3"  (1.905 m), weight 93.4 kg, SpO2 100 %. Body mass index is 25.74 kg/m?. ? ? ?Treatment Plan Summary: ?Medication management and Plan no change to medication management.  Praised patient for attempts to get up out of bed and be interactive.  Encouraged him to talk with members of the treatment team and to think about safe discharge plans.  Continue to review symptoms daily. ? ?Alethia Berthold, MD ?11/07/2021, 2:58 PM ? ?

## 2021-11-07 NOTE — Plan of Care (Signed)
?  Problem: Education: ?Goal: Knowledge of [facility] Education information/materials will improve ?Outcome: Progressing ?Goal: Verbalization of understanding the information provided will improve ?Outcome: Progressing ?  ?Problem: Health Behavior/Discharge Planning: ?Goal: Compliance with treatment plan for underlying cause of condition will improve ?Outcome: Progressing ?  ?Problem: Safety: ?Goal: Periods of time without injury will increase ?Outcome: Progressing ?  ?Problem: Coping: ?Goal: Will verbalize feelings ?Outcome: Progressing ?  ?Problem: Health Behavior/Discharge Planning: ?Goal: Compliance with prescribed medication regimen will improve ?Outcome: Progressing ?  ?

## 2021-11-07 NOTE — BH IP Treatment Plan (Signed)
Interdisciplinary Treatment and Diagnostic Plan Update ? ?11/07/2021 ?Time of Session: 9:30AM ?Timothy FeltyMarkie T [name] ?MRN: [id number] ? ?Principal Diagnosis: Schizoaffective disorder, bipolar type (HCC) ? ?Secondary Diagnoses: Principal Problem: ?  Schizoaffective disorder, bipolar type (HCC) ? ? ?Current Medications:  ?Current Facility-Administered Medications  ?Medication Dose Route Frequency Provider Last Rate Last Admin  ? acetaminophen (TYLENOL) tablet 650 mg  650 mg Oral Q6H PRN Charm RingsLord, Jamison Y, NP   650 mg at 11/07/21 0818  ? alum & mag hydroxide-simeth (MAALOX/MYLANTA) 200-200-20 MG/5ML suspension 30 mL  30 mL Oral Q4H PRN Charm RingsLord, Jamison Y, NP      ? divalproex (DEPAKOTE) DR tablet 500 mg  500 mg Oral Q12H Charm RingsLord, Jamison Y, NP   500 mg at 11/07/21 0818  ? FLUoxetine (PROZAC) capsule 20 mg  20 mg Oral Daily Charm RingsLord, Jamison Y, NP   20 mg at 11/07/21 0818  ? ibuprofen (ADVIL) tablet 600 mg  600 mg Oral Q6H PRN Clapacs, Jackquline DenmarkJohn T, MD      ? magnesium hydroxide (MILK OF MAGNESIA) suspension 30 mL  30 mL Oral Daily PRN Charm RingsLord, Jamison Y, NP      ? nicotine (NICODERM CQ - dosed in mg/24 hours) patch 14 mg  14 mg Transdermal Daily Clapacs, Jackquline DenmarkJohn T, MD   14 mg at 11/07/21 0818  ? QUEtiapine (SEROQUEL) tablet 400 mg  400 mg Oral QHS Clapacs, Jackquline DenmarkJohn T, MD   400 mg at 11/06/21 2116  ? QUEtiapine (SEROQUEL) tablet 50 mg  50 mg Oral BID Charm RingsLord, Jamison Y, NP   50 mg at 11/07/21 [id number]  ? ?PTA Medications: ?Medications Prior to Admission  ?Medication Sig Dispense Refill Last Dose  ? divalproex (DEPAKOTE) 500 MG DR tablet Take 1 tablet (500 mg total) by mouth every 12 (twelve) hours. (Patient not taking: Reported on 07/02/2021) 60 tablet 1   ? FLUoxetine (PROZAC) 20 MG capsule Take 1 capsule (20 mg total) by mouth daily. (Patient not taking: Reported on 07/02/2021) 30 capsule 1   ? QUEtiapine (SEROQUEL) 200 MG tablet Take 1 tablet (200 mg total) by mouth at bedtime. (Patient not taking: Reported on 07/02/2021) 30 tablet 0   ? QUEtiapine (SEROQUEL)  50 MG tablet Take 1 tablet (50 mg total) by mouth 2 (two) times daily. (Patient not taking: Reported on 07/02/2021) 60 tablet 0   ? ? ?Patient Stressors: Medication change or noncompliance   ?Substance abuse   ? ?Patient Strengths: Ability for insight  ?Motivation for treatment/growth  ? ?Treatment Modalities: Medication Management, Group therapy, Case management,  ?1 to 1 session with clinician, Psychoeducation, Recreational therapy. ? ? ?Physician Treatment Plan for Primary Diagnosis: Schizoaffective disorder, bipolar type (HCC) ?Long Term Goal(s): Improvement in symptoms so as ready for discharge  ? ?Short Term Goals: Ability to identify changes in lifestyle to reduce recurrence of condition will improve ?Ability to verbalize feelings will improve ?Ability to disclose and discuss suicidal ideas ?Ability to demonstrate self-control will improve ?Ability to identify and develop effective coping behaviors will improve ?Ability to maintain clinical measurements within normal limits will improve ?Compliance with prescribed medications will improve ?Ability to identify triggers associated with substance abuse/mental health issues will improve ? ?Medication Management: Evaluate patient's response, side effects, and tolerance of medication regimen. ? ?Therapeutic Interventions: 1 to 1 sessions, Unit Group sessions and Medication administration. ? ?Evaluation of Outcomes: Progressing ? ?Physician Treatment Plan for Secondary Diagnosis: Principal Problem: ?  Schizoaffective disorder, bipolar type (HCC) ? ?Long Term Goal(s): Improvement in symptoms so as  ready for discharge  ? ?Short Term Goals: Ability to identify changes in lifestyle to reduce recurrence of condition will improve ?Ability to verbalize feelings will improve ?Ability to disclose and discuss suicidal ideas ?Ability to demonstrate self-control will improve ?Ability to identify and develop effective coping behaviors will improve ?Ability to maintain clinical  measurements within normal limits will improve ?Compliance with prescribed medications will improve ?Ability to identify triggers associated with substance abuse/mental health issues will improve    ? ?Medication Management: Evaluate patient's response, side effects, and tolerance of medication regimen. ? ?Therapeutic Interventions: 1 to 1 sessions, Unit Group sessions and Medication administration. ? ?Evaluation of Outcomes: Progressing ? ? ?RN Treatment Plan for Primary Diagnosis: Schizoaffective disorder, bipolar type (HCC) ?Long Term Goal(s): Knowledge of disease and therapeutic regimen to maintain health will improve ? ?Short Term Goals: Ability to remain free from injury will improve, Ability to verbalize frustration and anger appropriately will improve, Ability to demonstrate self-control, Ability to participate in decision making will improve, Ability to verbalize feelings will improve, Ability to disclose and discuss suicidal ideas, Ability to identify and develop effective coping behaviors will improve, and Compliance with prescribed medications will improve ? ?Medication Management: RN will administer medications as ordered by provider, will assess and evaluate patient's response and provide education to patient for prescribed medication. RN will report any adverse and/or side effects to prescribing provider. ? ?Therapeutic Interventions: 1 on 1 counseling sessions, Psychoeducation, Medication administration, Evaluate responses to treatment, Monitor vital signs and CBGs as ordered, Perform/monitor CIWA, COWS, AIMS and Fall Risk screenings as ordered, Perform wound care treatments as ordered. ? ?Evaluation of Outcomes: Progressing ? ? ?LCSW Treatment Plan for Primary Diagnosis: Schizoaffective disorder, bipolar type (HCC) ?Long Term Goal(s): Safe transition to appropriate next level of care at discharge, Engage patient in therapeutic group addressing interpersonal concerns. ? ?Short Term Goals: Engage  patient in aftercare planning with referrals and resources, Increase social support, Increase ability to appropriately verbalize feelings, Increase emotional regulation, Facilitate acceptance of mental health diagnosis and concerns, Facilitate patient progression through stages of change regarding substance use diagnoses and concerns, and Identify triggers associated with mental health/substance abuse issues ? ?Therapeutic Interventions: Assess for all discharge needs, 1 to 1 time with Child psychotherapist, Explore available resources and support systems, Assess for adequacy in community support network, Educate family and significant other(s) on suicide prevention, Complete Psychosocial Assessment, Interpersonal group therapy. ? ?Evaluation of Outcomes: Progressing ? ? ?Progress in Treatment: ?Attending groups: Yes. ?Participating in groups: Yes. ?Taking medication as prescribed: Yes. ?Toleration medication: Yes. ?Family/Significant other contact made: No, will contact:  Patient refused consent for CSW to speak with family/friends. ?Patient understands diagnosis: No. ?Discussing patient identified problems/goals with staff: Yes. ?Medical problems stabilized or resolved: Yes. ?Denies suicidal/homicidal ideation: No. ?Issues/concerns per patient self-inventory: Yes. ?Other: none. ? ?New problem(s) identified: No, Describe:  No new problems identified at this time. ? ?New Short Term/Long Term Goal(s): Patient to work towards medication management for mood stabilization; elimination of SI thoughts; development of comprehensive mental wellness plan. Update 11/07/2021: No changes at this time. ? ?Patient Goals:  Patient was present for treatment team meeting though expressed no goals for treatment at this time. Update 11/07/2021: No changes at this time. ? ?Discharge Plan or Barriers: Patient continues to be homeless as similar to prior inpatient hospitalizations. CSW will have a further conversations with patient to discuss  options for housing after discharge. Update 11/07/2021: No changes at this time. ? ?Reason for Continuation  of Hospitalization: Depression ?Medication stabilization ?Suicidal ideation ? ?Estimated Length of Stay: 1

## 2021-11-07 NOTE — Progress Notes (Signed)
D: Patient alert and oriented. Patient rates pain 6/10 pain. PRN tylenol provided to patient. Patient states pain 8/10 after reassessed. Patient denies anxiety and depression. Patient denies SI/HI/VH. Patient endorses visual hallucinations stating he is seeing black spots. ? ?A: Scheduled medications administered to patient, per MD orders.  Support and encouragement provided to patient.  ?Q15 minute safety checks maintained.  ? ?R: Patient compliant with medication administration and treatment plan. No adverse drug reactions noted. Patient remains safe on the unit at this time.  ?

## 2021-11-08 DIAGNOSIS — F250 Schizoaffective disorder, bipolar type: Secondary | ICD-10-CM | POA: Diagnosis not present

## 2021-11-08 MED ORDER — BUPROPION HCL ER (XL) 150 MG PO TB24
150.0000 mg | ORAL_TABLET | Freq: Every day | ORAL | Status: DC
  Administered 2021-11-08 – 2021-11-17 (×10): 150 mg via ORAL
  Filled 2021-11-08 (×11): qty 1

## 2021-11-08 MED ORDER — QUETIAPINE FUMARATE 200 MG PO TABS
600.0000 mg | ORAL_TABLET | Freq: Every day | ORAL | Status: DC
  Administered 2021-11-08 – 2021-11-18 (×11): 600 mg via ORAL
  Filled 2021-11-08 (×13): qty 3

## 2021-11-08 NOTE — Progress Notes (Signed)
[facility] MD Progress Note ? ?11/08/2021 12:46 PM ?Timothy [name]  ?MRN:  [id number] ?Subjective: Follow-up patient with schizoaffective disorder continues to be very depressed.  He continues to hear auditory hallucinations saying negative things to him making him feel more sad and negative.  Frequent suicidal ideation without any specific plan to act on it.  Tolerating medicine well.  Comes out of his room but is not very active with other people.  Affect remains blunted. ?Principal Problem: Schizoaffective disorder, bipolar type ([place]) ?Diagnosis: Principal Problem: ?  Schizoaffective disorder, bipolar type ([place]) ? ?Total Time spent with patient: 30 minutes ? ?Past Psychiatric History: Past history of recurrent depressions and psychosis ? ?Past Medical History:  ?Past Medical History:  ?Diagnosis Date  ? Cannabis abuse   ? Paranoid schizophrenia ([place])   ? Schizoaffective disorder, depressive type ([place]) 09/17/2014  ? History reviewed. No pertinent surgical history. ?Family History:  ?Family History  ?Problem Relation Age of Onset  ? Mental illness Brother   ? Drug abuse Brother   ? Mental illness Cousin   ? Suicidality Cousin   ? Diabetes Maternal Grandmother   ? ?Family Psychiatric  History: See previous ?Social History:  ?Social History  ? ?Substance and Sexual Activity  ?Alcohol Use Yes  ? Alcohol/week: 2.0 standard drinks  ? Types: 2 Cans of beer per week  ?   ?Social History  ? ?Substance and Sexual Activity  ?Drug Use Yes  ? 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  ?Social History  ? ?Socioeconomic History  ? Marital status: Single  ?  Spouse name: Not on file  ? Number of children: Not on file  ? Years of education: Not on file  ? Highest education level: Not on file  ?Occupational History  ? Not on file  ?Tobacco Use  ? Smoking status: Some Days  ?  Packs/day: 0.25  ?  Types: Cigarettes  ? Smokeless tobacco: Never  ?Vaping Use  ? Vaping Use: Never used  ?Substance and Sexual Activity  ? Alcohol use: Yes  ?  Alcohol/week:  2.0 standard drinks  ?  Types: 2 Cans of beer per week  ? Drug use: Yes  ?  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 Sexual activity: Yes  ?Other Topics Concern  ? Not on file  ?Social History Narrative  ? Not on file  ? ?Social Determinants of Health  ? ?Financial Resource Strain: Not on file  ?Food Insecurity: Not on file  ?Transportation Needs: Not on file  ?Physical Activity: Not on file  ?Stress: Not on file  ?Social Connections: Not on file  ? ?Additional Social History:  ?  ?  ?  ?  ?  ?  ?  ?  ?  ?  ?  ? ?Sleep: Fair ? ?Appetite:  Fair ? ?Current Medications: ?Current Facility-Administered Medications  ?Medication Dose Route Frequency Provider Last Rate Last Admin  ? acetaminophen (TYLENOL) tablet 650 mg  650 mg Oral Q6H PRN Patrecia Pour, NP   650 mg at 11/07/21 0818  ? alum & mag hydroxide-simeth (MAALOX/MYLANTA) 200-200-20 MG/5ML suspension 30 mL  30 mL Oral Q4H PRN Patrecia Pour, NP      ? buPROPion (WELLBUTRIN XL) 24 hr tablet 150 mg  150 mg Oral Daily Lenoria Narine T, MD   150 mg at 11/08/21 1217  ? divalproex (DEPAKOTE) DR tablet 500 mg  500 mg Oral Q12H Patrecia Pour, NP   500 mg at 11/08/21 0809  ? FLUoxetine (  PROZAC) capsule 20 mg  20 mg Oral Daily Patrecia Pour, NP   20 mg at 11/08/21 [id number]  ? ibuprofen (ADVIL) tablet 600 mg  600 mg Oral Q6H PRN Aleia Larocca, Madie Reno, MD      ? magnesium hydroxide (MILK OF MAGNESIA) suspension 30 mL  30 mL Oral Daily PRN Patrecia Pour, NP      ? nicotine (NICODERM CQ - dosed in mg/24 hours) patch 14 mg  14 mg Transdermal Daily Ardyn Forge, Madie Reno, MD   14 mg at 11/08/21 0810  ? QUEtiapine (SEROQUEL) tablet 50 mg  50 mg Oral BID Patrecia Pour, NP   50 mg at 11/08/21 0809  ? QUEtiapine (SEROQUEL) tablet 600 mg  600 mg Oral QHS Mattia Liford T, MD      ? ? ?Lab Results: No results found for this or any previous visit (from the past 48 hour(s)). ? ?Blood Alcohol level:  ?Lab Results  ?Component Value Date  ? ETH <10 10/31/2021  ? ETH <10 06/30/2021  ? ? ?Metabolic  Disorder Labs: ?Lab Results  ?Component Value Date  ? HGBA1C 5.5 11/04/2021  ? MPG 111.15 11/04/2021  ? MPG 114 06/17/2021  ? ?Lab Results  ?Component Value Date  ? PROLACTIN 33.2 (H) 08/15/2016  ? ?Lab Results  ?Component Value Date  ? CHOL 149 11/04/2021  ? TRIG 56 11/04/2021  ? HDL 35 (L) 11/04/2021  ? CHOLHDL 4.3 11/04/2021  ? VLDL 11 11/04/2021  ? LDLCALC 103 (H) 11/04/2021  ? LDLCALC 111 (H) 06/17/2021  ? ? ?Physical Findings: ?AIMS:  , ,  ,  ,    ?CIWA:    ?COWS:    ? ?Musculoskeletal: ?Strength & Muscle Tone: within normal limits ?Gait & Station: normal ?Patient leans: N/A ? ?Psychiatric Specialty Exam: ? ?Presentation  ?General Appearance: Appropriate for Environment ? ?Eye Contact:Fair ? ?Speech:Normal Rate ? ?Speech Volume:Normal ? ?Handedness:Right ? ? ?Mood and Affect  ?Mood:Euthymic ? ?Affect:Appropriate ? ? ?Thought Process  ?Thought Processes:Coherent ? ?Descriptions of Associations:Intact ? ?Orientation:Full (Time, Place and Person) ? ?Thought Content:Logical ? ?History of Schizophrenia/Schizoaffective disorder:Yes ? ?Duration of Psychotic Symptoms:Greater than six months ? ?Hallucinations:No data recorded ?Ideas of Reference:None ? ?Suicidal Thoughts:No data recorded ?Homicidal Thoughts:No data recorded ? ?Sensorium  ?Memory:Immediate Good; Recent Good; Remote Good ? ?Judgment:Fair ? ?Insight:Fair ? ? ?Executive Functions  ?Concentration:Good ? ?Attention Span:Good ? ?Recall:Fair ? ?[place] ? ?Language:Good ? ? ?Psychomotor Activity  ?Psychomotor Activity:No data recorded ? ?Assets  ?Assets:Communication Skills; Desire for Improvement; Financial Resources/Insurance; Resilience; Social Support ? ? ?Sleep  ?Sleep:No data recorded ? ? ?Physical Exam: ?Physical Exam ?Vitals and nursing note reviewed.  ?Constitutional:   ?   Appearance: Normal appearance.  ?HENT:  ?   Head: Normocephalic and atraumatic.  ?   Mouth/Throat:  ?   Pharynx: Oropharynx is clear.  ?Eyes:  ?   Pupils: Pupils are  equal, round, and reactive to light.  ?Cardiovascular:  ?   Rate and Rhythm: Normal rate and regular rhythm.  ?Pulmonary:  ?   Effort: Pulmonary effort is normal.  ?   Breath sounds: Normal breath sounds.  ?Abdominal:  ?   General: Abdomen is flat.  ?   Palpations: Abdomen is soft.  ?Musculoskeletal:     ?   General: Normal range of motion.  ?Skin: ?   General: Skin is warm and dry.  ?Neurological:  ?   General: No focal deficit present.  ?   Mental Status: He  is alert. Mental status is at baseline.  ?Psychiatric:     ?   Attention and Perception: Attention normal. He perceives auditory hallucinations.     ?   Mood and Affect: Mood is depressed. Affect is blunt.     ?   Speech: Speech is delayed.     ?   Behavior: Behavior is slowed.     ?   Thought Content: Thought content includes suicidal ideation. Thought content does not include suicidal plan.     ?   Cognition and Memory: Cognition is impaired.  ? ?Review of Systems  ?Constitutional: Negative.   ?HENT: Negative.    ?Eyes: Negative.   ?Respiratory: Negative.    ?Cardiovascular: Negative.   ?Gastrointestinal: Negative.   ?Musculoskeletal: Negative.   ?Skin: Negative.   ?Neurological: Negative.   ?Psychiatric/Behavioral:  Positive for depression, hallucinations and suicidal ideas. Negative for substance abuse. The patient is nervous/anxious.   ?Blood pressure (!) 119/51, pulse 82, temperature 98 ?F (36.7 ?C), temperature source Oral, resp. rate 18, height 6\' 3"  (1.905 m), weight 93.4 kg, SpO2 100 %. Body mass index is 25.74 kg/m?. ? ? ?Treatment Plan Summary: ?Medication management and Plan reviewed old chart.  Patient had previously done well on higher doses of Seroquel as well as on bupropion.  I am going to start him on Wellbutrin and increase the dose of the Seroquel to 600 mg at night.  I am keeping in mind that he may be a good candidate for ECT but for now we will increase the medicine and encourage him to keep coming out and participating in letting us  know how he is doing. ? ?Alethia Berthold, MD ?11/08/2021, 12:46 PM ? ?

## 2021-11-08 NOTE — Group Note (Signed)
[facility] LCSW Group Therapy Note ? ? ?Group Date: 11/08/2021 ?Start Time: 1300 ?End Time: 1400 ? ? ?Type of Therapy/Topic:  Group Therapy:  Balance in Life ? ?Participation Level:  Active  ? ?Description of Group:   ? This group will address the concept of balance and how it feels and looks when one is unbalanced. Patients will be encouraged to process areas in their lives that are out of balance, and identify reasons for remaining unbalanced. Facilitators will guide patients utilizing problem- solving interventions to address and correct the stressor making their life unbalanced. Understanding and applying boundaries will be explored and addressed for obtaining  and maintaining a balanced life. Patients will be encouraged to explore ways to assertively make their unbalanced needs known to significant others in their lives, using other group members and facilitator for support and feedback. ? ?Therapeutic Goals: ?Patient will identify two or more emotions or situations they have that consume much of in their lives. ?Patient will identify signs/triggers that life has become out of balance:  ?Patient will identify two ways to set boundaries in order to achieve balance in their lives:  ?Patient will demonstrate ability to communicate their needs through discussion and/or role plays ? ?Summary of Patient Progress: Patient was present for the duration of group and actively participated in topic of discussion. Patient read examples about achieving balance from the supplemental group handout and was observed actively listening to other group members share. Patient shared that spending time with friends and working out are important aspects of his self care.  ? ? ?Therapeutic Modalities:   ?Cognitive Behavioral Therapy ?Solution-Focused Therapy ?Assertiveness Training ? ? ?Ileana Ladd Latanja Lehenbauer, LCSWA ?

## 2021-11-08 NOTE — Plan of Care (Signed)
Patient appears with blunted affect but brightens up on approach. Patient opens up more with staff today.States " I don't get along with my mom. She treats me like a child. I am trying to stay away from her." Patient verbalized his concern about her living situation also. Patient receptive with staff for the support and encouragement given. Denies SI,HI and AVH at this time. Appetite and energy level good. Minimal interactions with peers. ADLs maintained. ?

## 2021-11-09 DIAGNOSIS — F250 Schizoaffective disorder, bipolar type: Secondary | ICD-10-CM | POA: Diagnosis not present

## 2021-11-09 NOTE — Progress Notes (Signed)
Pt isolative and in bed since 7 pm. No inter action with peers or staff. Pt denies SI/HI, but reports AVH. He stated, "I see shadows and I hear dog whistle. He said his anxiety and depression was 10. Pt stated his goal was to work on him. ?

## 2021-11-09 NOTE — Progress Notes (Addendum)
Pt rates anxiety, depression still both at 3/10. Pt says that he has passive SI with no plan. Pt says that he has AVH. Pt has attended all the groups, been calm, cooperative and med compliant. He has a flat affect and is slow and speaks quietly. Torrie Mayers RN  ? ? ?

## 2021-11-09 NOTE — Plan of Care (Signed)
?  Problem: Education: ?Goal: Knowledge of [facility] Education information/materials will improve ?Outcome: Progressing ?Goal: Emotional status will improve ?Outcome: Progressing ?Goal: Mental status will improve ?Description: Patient will became more alert no confusion to place and situation  ?Outcome: Progressing ?Goal: Verbalization of understanding the information provided will improve ?Outcome: Progressing ?  ?Problem: Activity: ?Goal: Interest or engagement in activities will improve ?Outcome: Progressing ?Goal: Sleeping patterns will improve ?Outcome: Progressing ?  ?Problem: Coping: ?Goal: Ability to verbalize frustrations and anger appropriately will improve ?Description: Patient will verbalize frustration and anger to the staff appropriately.  ?Outcome: Progressing ?Goal: Ability to demonstrate self-control will improve ?Outcome: Progressing ?  ?Problem: Health Behavior/Discharge Planning: ?Goal: Identification of resources available to assist in meeting health care needs will improve ?Outcome: Progressing ?Goal: Compliance with treatment plan for underlying cause of condition will improve ?Outcome: Progressing ?  ?Problem: Physical Regulation: ?Goal: Ability to maintain clinical measurements within normal limits will improve ?Outcome: Progressing ?  ?Problem: Safety: ?Goal: Periods of time without injury will increase ?Outcome: Progressing ?  ?Problem: Activity: ?Goal: Will verbalize the importance of balancing activity with adequate rest periods ?Outcome: Progressing ?  ?Problem: Education: ?Goal: Will be free of psychotic symptoms ?Description: Patient will be free of psychosis  ?Outcome: Progressing ?Goal: Knowledge of the prescribed therapeutic regimen will improve ?Outcome: Progressing ?  ?Problem: Coping: ?Goal: Coping ability will improve ?Outcome: Progressing ?Goal: Will verbalize feelings ?Outcome: Progressing ?  ?Problem: Health Behavior/Discharge Planning: ?Goal: Compliance with  prescribed medication regimen will improve ?Outcome: Progressing ?  ?Problem: Nutritional: ?Goal: Ability to achieve adequate nutritional intake will improve ?Outcome: Progressing ?  ?Problem: Role Relationship: ?Goal: Ability to communicate needs accurately will improve ?Outcome: Progressing ?Goal: Ability to interact with others will improve ?Outcome: Progressing ?  ?Problem: Safety: ?Goal: Ability to redirect hostility and anger into socially appropriate behaviors will improve ?Outcome: Progressing ?Goal: Ability to remain free from injury will improve ?Outcome: Progressing ?  ?Problem: Self-Care: ?Goal: Ability to participate in self-care as condition permits will improve ?Outcome: Progressing ?  ?Problem: Self-Concept: ?Goal: Will verbalize positive feelings about self ?Outcome: Progressing ?  ?

## 2021-11-09 NOTE — Plan of Care (Signed)
?Problem: Education: ?Goal: Knowledge of [facility] Education information/materials will improve ?11/09/2021 2054 by Ardelle Anton, RN ?Outcome: Progressing ?11/09/2021 2033 by Ardelle Anton, RN ?Outcome: Progressing ?Goal: Emotional status will improve ?11/09/2021 2054 by Ardelle Anton, RN ?Outcome: Progressing ?11/09/2021 2033 by Ardelle Anton, RN ?Outcome: Progressing ?Goal: Mental status will improve ?Description: Patient will became more alert no confusion to place and situation  ?11/09/2021 2054 by Ardelle Anton, RN ?Outcome: Progressing ?11/09/2021 2033 by Ardelle Anton, RN ?Outcome: Progressing ?Goal: Verbalization of understanding the information provided will improve ?11/09/2021 2054 by Ardelle Anton, RN ?Outcome: Progressing ?11/09/2021 2033 by Ardelle Anton, RN ?Outcome: Progressing ?  ?Problem: Activity: ?Goal: Interest or engagement in activities will improve ?11/09/2021 2054 by Ardelle Anton, RN ?Outcome: Progressing ?11/09/2021 2033 by Ardelle Anton, RN ?Outcome: Progressing ?Goal: Sleeping patterns will improve ?11/09/2021 2054 by Ardelle Anton, RN ?Outcome: Progressing ?11/09/2021 2033 by Ardelle Anton, RN ?Outcome: Progressing ?  ?Problem: Coping: ?Goal: Ability to verbalize frustrations and anger appropriately will improve ?Description: Patient will verbalize frustration and anger to the staff appropriately.  ?11/09/2021 2054 by Ardelle Anton, RN ?Outcome: Progressing ?11/09/2021 2033 by Ardelle Anton, RN ?Outcome: Progressing ?Goal: Ability to demonstrate self-control will improve ?11/09/2021 2054 by Ardelle Anton, RN ?Outcome: Progressing ?11/09/2021 2033 by Ardelle Anton, RN ?Outcome: Progressing ?  ?Problem: Health Behavior/Discharge Planning: ?Goal: Identification of resources available to assist in meeting health care needs will improve ?11/09/2021 2054 by  Ardelle Anton, RN ?Outcome: Progressing ?11/09/2021 2033 by Ardelle Anton, RN ?Outcome: Progressing ?Goal: Compliance with treatment plan for underlying cause of condition will improve ?11/09/2021 2054 by Ardelle Anton, RN ?Outcome: Progressing ?11/09/2021 2033 by Ardelle Anton, RN ?Outcome: Progressing ?  ?Problem: Physical Regulation: ?Goal: Ability to maintain clinical measurements within normal limits will improve ?11/09/2021 2054 by Ardelle Anton, RN ?Outcome: Progressing ?11/09/2021 2033 by Ardelle Anton, RN ?Outcome: Progressing ?  ?Problem: Safety: ?Goal: Periods of time without injury will increase ?11/09/2021 2054 by Ardelle Anton, RN ?Outcome: Progressing ?11/09/2021 2033 by Ardelle Anton, RN ?Outcome: Progressing ?  ?Problem: Activity: ?Goal: Will verbalize the importance of balancing activity with adequate rest periods ?11/09/2021 2054 by Ardelle Anton, RN ?Outcome: Progressing ?11/09/2021 2033 by Ardelle Anton, RN ?Outcome: Progressing ?  ?Problem: Education: ?Goal: Will be free of psychotic symptoms ?Description: Patient will be free of psychosis  ?11/09/2021 2054 by Ardelle Anton, RN ?Outcome: Progressing ?11/09/2021 2033 by Ardelle Anton, RN ?Outcome: Progressing ?Goal: Knowledge of the prescribed therapeutic regimen will improve ?11/09/2021 2054 by Ardelle Anton, RN ?Outcome: Progressing ?11/09/2021 2033 by Ardelle Anton, RN ?Outcome: Progressing ?  ?Problem: Coping: ?Goal: Coping ability will improve ?11/09/2021 2054 by Ardelle Anton, RN ?Outcome: Progressing ?11/09/2021 2033 by Ardelle Anton, RN ?Outcome: Progressing ?Goal: Will verbalize feelings ?11/09/2021 2054 by Ardelle Anton, RN ?Outcome: Progressing ?11/09/2021 2033 by Ardelle Anton, RN ?Outcome: Progressing ?  ?Problem: Health Behavior/Discharge Planning: ?Goal: Compliance with prescribed  medication regimen will improve ?11/09/2021 2054 by Ardelle Anton, RN ?Outcome: Progressing ?11/09/2021 2033 by Ardelle Anton, RN ?Outcome: Progressing ?  ?Problem: Nutritional: ?Goal: Ability to achieve adequate nutritional intake will improve ?11/09/2021 2054 by Ardelle Anton, RN ?Outcome: Progressing ?11/09/2021 2033 by Ardelle Anton, RN ?Outcome: Progressing ?  ?Problem: Role Relationship: ?Goal: Ability to communicate needs accurately will improve ?11/09/2021 2054 by Lindwood Coke  M, RN ?Outcome: Progressing ?11/09/2021 2033 by Ardelle Anton, RN ?Outcome: Progressing ?Goal: Ability to interact with others will improve ?11/09/2021 2054 by Ardelle Anton, RN ?Outcome: Progressing ?11/09/2021 2033 by Ardelle Anton, RN ?Outcome: Progressing ?  ?Problem: Safety: ?Goal: Ability to redirect hostility and anger into socially appropriate behaviors will improve ?11/09/2021 2054 by Ardelle Anton, RN ?Outcome: Progressing ?11/09/2021 2033 by Ardelle Anton, RN ?Outcome: Progressing ?Goal: Ability to remain free from injury will improve ?11/09/2021 2054 by Ardelle Anton, RN ?Outcome: Progressing ?11/09/2021 2033 by Ardelle Anton, RN ?Outcome: Progressing ?  ?Problem: Self-Care: ?Goal: Ability to participate in self-care as condition permits will improve ?11/09/2021 2054 by Ardelle Anton, RN ?Outcome: Progressing ?11/09/2021 2033 by Ardelle Anton, RN ?Outcome: Progressing ?  ?Problem: Self-Concept: ?Goal: Will verbalize positive feelings about self ?11/09/2021 2054 by Ardelle Anton, RN ?Outcome: Progressing ?11/09/2021 2033 by Ardelle Anton, RN ?Outcome: Progressing ?  ?

## 2021-11-09 NOTE — Progress Notes (Signed)
Recreation Therapy Notes ? ?Date: 11/09/2021 ? ?Time: 10:40 am   ? ?Location: Craft room  ? ?Behavioral response: Appropriate ? ?Intervention Topic:  Problem Solving  ? ?Discussion/Intervention:  ?Group content on today was focused on problem solving. The group described what problem solving is. Patients expressed how problems affect them and how they deal with problems. Individuals identified healthy ways to deal with problems. Patients explained what normally happens to them when they do not deal with problems. The group expressed reoccurring problems for them. The group participated in the intervention ?Ways to Solve problems? where patients were given a chance to explore different ways to solve problems.  ?Clinical Observations/Feedback: ?Patient came to group and was focused on what peers and staff had to say about problem solving. Individual was social with peers and staff while participating in the intervention.    ?Timothy [name] LRT/CTRS  ? ? ? ? ? ? ? ?[name] ?11/09/2021 12:28 PM ?

## 2021-11-09 NOTE — Progress Notes (Signed)
Patient is alert and oriented x 4 affect is blunted, thoughts are organized, he denies SI/HI/AVH interacting appropriately with peers and staff. 15 minutes safety checks maintained will continue to monitor.  ?

## 2021-11-09 NOTE — Group Note (Signed)
[place] LCSW Group Therapy Note ? ? ? ?Group Date: 11/09/2021 ?Start Time: 1300 ?End Time: 1400 ? ?Type of Therapy and Topic:  Group Therapy:  Overcoming Obstacles ? ?Participation Level:  [facility] PARTICIPATION LEVEL: Active ? ?Mood: ? ?Description of Group:   ?In this group patients will be encouraged to explore what they see as obstacles to their own wellness and recovery. They will be guided to discuss their thoughts, feelings, and behaviors related to these obstacles. The group will process together ways to cope with barriers, with attention given to specific choices patients can make. Each patient will be challenged to identify changes they are motivated to make in order to overcome their obstacles. This group will be process-oriented, with patients participating in exploration of their own experiences as well as giving and receiving support and challenge from other group members. ? ?Therapeutic Goals: ?1. Patient will identify personal and current obstacles as they relate to admission. ?2. Patient will identify barriers that currently interfere with their wellness or overcoming obstacles.  ?3. Patient will identify feelings, thought process and behaviors related to these barriers. ?4. Patient will identify two changes they are willing to make to overcome these obstacles:  ? ? ?Summary of Patient Progress ?Patient was present for the entirety of group. He identified family issues as an obstacle that he needs to overcome. Pt stated that he has issues with being able to share his thoughts/feelings which has been a barrier to overcoming the obstacle. He shared that in order to overcome this obstacle he is going to stay away from his family and connect with new people in order to build up his support system. ? ? ?Therapeutic Modalities:   ?Cognitive Behavioral Therapy ?Solution Focused Therapy ?Motivational Interviewing ?Relapse Prevention Therapy ? ? ?Shirl Harris, LCSW ?

## 2021-11-09 NOTE — Progress Notes (Signed)
[facility] ? ?11/09/2021 6:07 PM ?Timothy [name]  ?MRN:  [id number] ?Subjective: Follow-up patient with schizoaffective disorder.  Still hearing voices still having suicidal thoughts.  Also having a bad headache this evening ?Principal Problem: Schizoaffective disorder, bipolar type (HCC) ?Diagnosis: Principal Problem: ?  Schizoaffective disorder, bipolar type (HCC) ? ?Total Time spent with patient: 20 minutes ? ?Past Psychiatric History: Past history of schizoaffective disorder recurrent depression ? ?Past Medical History:  ?Past Medical History:  ?Diagnosis Date  ? Cannabis abuse   ? Paranoid schizophrenia (HCC)   ? Schizoaffective disorder, depressive type (HCC) 09/17/2014  ? History reviewed. No pertinent surgical history. ?Family History:  ?Family History  ?Problem Relation Age of Onset  ? Mental illness Brother   ? Drug abuse Brother   ? Mental illness Cousin   ? Suicidality Cousin   ? Diabetes Maternal Grandmother   ? ?Family Psychiatric  History: See previous ?Social History:  ?Social History  ? ?Substance and Sexual Activity  ?Alcohol Use Yes  ? Alcohol/week: 2.0 standard drinks  ? Types: 2 Cans of beer per week  ?   ?Social History  ? ?Substance and Sexual Activity  ?Drug Use Yes  ? 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  ?Social History  ? ?Socioeconomic History  ? Marital status: Single  ?  Spouse name: Not on file  ? Number of children: Not on file  ? Years of education: Not on file  ? Highest education level: Not on file  ?Occupational History  ? Not on file  ?Tobacco Use  ? Smoking status: Some Days  ?  Packs/day: 0.25  ?  Types: Cigarettes  ? Smokeless tobacco: Never  ?Vaping Use  ? Vaping Use: Never used  ?Substance and Sexual Activity  ? Alcohol use: Yes  ?  Alcohol/week: 2.0 standard drinks  ?  Types: 2 Cans of beer per week  ? Drug use: Yes  ?  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 Sexual activity: Yes  ?Other Topics Concern  ? Not on file  ?Social History Narrative  ? Not on file  ? ?Social  Determinants of Health  ? ?Financial Resource Strain: Not on file  ?Food Insecurity: Not on file  ?Transportation Needs: Not on file  ?Physical Activity: Not on file  ?Stress: Not on file  ?Social Connections: Not on file  ? ?Additional Social History:  ?  ?  ?  ?  ?  ?  ?  ?  ?  ?  ?  ? ?Sleep: Fair ? ?Appetite:  Fair ? ?Current Medications: ?Current Facility-Administered Medications  ?Medication Dose Route Frequency Provider Last Rate Last Admin  ? acetaminophen (TYLENOL) tablet 650 mg  650 mg Oral Q6H PRN Charm RingsLord, Jamison Y, NP   650 mg at 11/07/21 0818  ? alum & mag hydroxide-simeth (MAALOX/MYLANTA) 200-200-20 MG/5ML suspension 30 mL  30 mL Oral Q4H PRN Charm RingsLord, Jamison Y, NP      ? buPROPion (WELLBUTRIN XL) 24 hr tablet 150 mg  150 mg Oral Daily Brayla Pat, Jackquline DenmarkJohn T, MD   150 mg at 11/09/21 0757  ? divalproex (DEPAKOTE) DR tablet 500 mg  500 mg Oral Q12H Charm RingsLord, Jamison Y, NP   500 mg at 11/09/21 [id number]  ? FLUoxetine (PROZAC) capsule 20 mg  20 mg Oral Daily Charm RingsLord, Jamison Y, NP   20 mg at 11/09/21 [id number]  ? ibuprofen (ADVIL) tablet 600 mg  600 mg Oral Q6H PRN Dua Mehler, Jackquline DenmarkJohn T, MD   600 mg at  11/09/21 1653  ? magnesium hydroxide (MILK OF MAGNESIA) suspension 30 mL  30 mL Oral Daily PRN Charm Rings, NP      ? nicotine (NICODERM CQ - dosed in mg/24 hours) patch 14 mg  14 mg Transdermal Daily Jameela Michna, Jackquline Denmark, MD   14 mg at 11/09/21 0758  ? QUEtiapine (SEROQUEL) tablet 50 mg  50 mg Oral BID Charm Rings, NP   50 mg at 11/09/21 1652  ? QUEtiapine (SEROQUEL) tablet 600 mg  600 mg Oral QHS Madilyn Cephas, Jackquline Denmark, MD   600 mg at 11/08/21 2127  ? ? ?Lab Results: No results found for this or any previous visit (from the past 48 hour(s)). ? ?Blood Alcohol level:  ?Lab Results  ?Component Value Date  ? ETH <10 10/31/2021  ? ETH <10 06/30/2021  ? ? ?Metabolic Disorder Labs: ?Lab Results  ?Component Value Date  ? HGBA1C 5.5 11/04/2021  ? MPG 111.15 11/04/2021  ? MPG 114 06/17/2021  ? ?Lab Results  ?Component Value Date  ? PROLACTIN 33.2 (H)  08/15/2016  ? ?Lab Results  ?Component Value Date  ? CHOL 149 11/04/2021  ? TRIG 56 11/04/2021  ? HDL 35 (L) 11/04/2021  ? CHOLHDL 4.3 11/04/2021  ? VLDL 11 11/04/2021  ? LDLCALC 103 (H) 11/04/2021  ? LDLCALC 111 (H) 06/17/2021  ? ? ?Physical Findings: ?AIMS:  , ,  ,  ,    ?CIWA:    ?COWS:    ? ?Musculoskeletal: ?Strength & Muscle Tone: within normal limits ?Gait & Station: normal ?Patient leans: N/A ? ?Psychiatric Specialty Exam: ? ?Presentation  ?General Appearance: Appropriate for Environment ? ?Eye Contact:Fair ? ?Speech:Normal Rate ? ?Speech Volume:Normal ? ?Handedness:Right ? ? ?Mood and Affect  ?Mood:Euthymic ? ?Affect:Appropriate ? ? ?Thought Process  ?Thought Processes:Coherent ? ?Descriptions of Associations:Intact ? ?Orientation:Full (Time, Place and Person) ? ?Thought Content:Logical ? ?History of Schizophrenia/Schizoaffective disorder:Yes ? ?Duration of Psychotic Symptoms:Greater than six months ? ?Hallucinations:No data recorded ?Ideas of Reference:None ? ?Suicidal Thoughts:No data recorded ?Homicidal Thoughts:No data recorded ? ?Sensorium  ?Memory:Immediate Good; Recent Good; Remote Good ? ?Judgment:Fair ? ?Insight:Fair ? ? ?Executive Functions  ?Concentration:Good ? ?Attention Span:Good ? ?Recall:Fair ? ?Fund of Knowledge:Fair ? ?Language:Good ? ? ?Psychomotor Activity  ?Psychomotor Activity:No data recorded ? ?Assets  ?Assets:Communication Skills; Desire for Improvement; Financial Resources/Insurance; Resilience; Social Support ? ? ?Sleep  ?Sleep:No data recorded ? ? ?Physical Exam: ?Physical Exam ?Vitals and nursing note reviewed.  ?Constitutional:   ?   Appearance: Normal appearance.  ?HENT:  ?   Head: Normocephalic and atraumatic.  ?   Mouth/Throat:  ?   Pharynx: Oropharynx is clear.  ?Eyes:  ?   Pupils: Pupils are equal, round, and reactive to light.  ?Cardiovascular:  ?   Rate and Rhythm: Normal rate and regular rhythm.  ?Pulmonary:  ?   Effort: Pulmonary effort is normal.  ?   Breath sounds:  Normal breath sounds.  ?Abdominal:  ?   General: Abdomen is flat.  ?   Palpations: Abdomen is soft.  ?Musculoskeletal:     ?   General: Normal range of motion.  ?Skin: ?   General: Skin is warm and dry.  ?Neurological:  ?   General: No focal deficit present.  ?   Mental Status: He is alert. Mental status is at baseline.  ?Psychiatric:     ?   Attention and Perception: Attention normal. He perceives auditory hallucinations.     ?   Mood and Affect: Mood  is depressed.     ?   Thought Content: Thought content includes suicidal ideation. Thought content does not include suicidal plan.  ? ?Review of Systems  ?Constitutional: Negative.   ?HENT: Negative.    ?Eyes: Negative.   ?Respiratory: Negative.    ?Cardiovascular: Negative.   ?Gastrointestinal: Negative.   ?Musculoskeletal: Negative.   ?Skin: Negative.   ?Neurological: Negative.   ?Psychiatric/Behavioral:  Positive for depression, hallucinations and suicidal ideas.   ?Blood pressure 113/63, pulse 74, temperature 98.1 ?F (36.7 ?C), temperature source Oral, resp. rate 18, height 6\' 3"  (1.905 m), weight 93.4 kg, SpO2 100 %. Body mass index is 25.74 kg/m?. ? ? ?Treatment Plan Summary: ?Plan I am uncertain whether we are seeing any progress.  Seems to have had a little bit of progress a couple days ago and then plateaued.  Tomorrow we will talk more about what might be done as far as treatment to see if we can get him any better as he still is having suicidal thoughts. ? ? , MD ?11/09/2021, 6:07 PM ? ?

## 2021-11-09 NOTE — Progress Notes (Addendum)
Correction note. See previous note on pt ?

## 2021-11-09 NOTE — Plan of Care (Signed)
Pt rates depression, anxiety and hopelessness all at 10/10, Pt denies HI but has passive SI with no plan. Pt verbally contracts for safety. Pt was educated on care plan and verbalizes understanding. Torrie Mayers RN ?Problem: Education: ?Goal: Knowledge of [facility] Education information/materials will improve ?Outcome: Progressing ?Goal: Emotional status will improve ?Outcome: Progressing ?Goal: Mental status will improve ?Description: Patient will became more alert no confusion to place and situation  ?Outcome: Progressing ?Goal: Verbalization of understanding the information provided will improve ?Outcome: Progressing ?  ?Problem: Activity: ?Goal: Interest or engagement in activities will improve ?Outcome: Progressing ?Goal: Sleeping patterns will improve ?Outcome: Progressing ?  ?Problem: Coping: ?Goal: Ability to verbalize frustrations and anger appropriately will improve ?Description: Patient will verbalize frustration and anger to the staff appropriately.  ?Outcome: Progressing ?Goal: Ability to demonstrate self-control will improve ?Outcome: Progressing ?  ?Problem: Health Behavior/Discharge Planning: ?Goal: Identification of resources available to assist in meeting health care needs will improve ?Outcome: Progressing ?Goal: Compliance with treatment plan for underlying cause of condition will improve ?Outcome: Progressing ?  ?Problem: Physical Regulation: ?Goal: Ability to maintain clinical measurements within normal limits will improve ?Outcome: Progressing ?  ?Problem: Safety: ?Goal: Periods of time without injury will increase ?Outcome: Progressing ?  ?Problem: Activity: ?Goal: Will verbalize the importance of balancing activity with adequate rest periods ?Outcome: Progressing ?  ?Problem: Education: ?Goal: Will be free of psychotic symptoms ?Description: Patient will be free of psychosis  ?Outcome: Progressing ?Goal: Knowledge of the prescribed therapeutic regimen will improve ?Outcome:  Progressing ?  ?Problem: Coping: ?Goal: Coping ability will improve ?Outcome: Progressing ?Goal: Will verbalize feelings ?Outcome: Progressing ?  ?Problem: Health Behavior/Discharge Planning: ?Goal: Compliance with prescribed medication regimen will improve ?Outcome: Progressing ?  ?Problem: Nutritional: ?Goal: Ability to achieve adequate nutritional intake will improve ?Outcome: Progressing ?  ?Problem: Role Relationship: ?Goal: Ability to communicate needs accurately will improve ?Outcome: Progressing ?Goal: Ability to interact with others will improve ?Outcome: Progressing ?  ?Problem: Safety: ?Goal: Ability to redirect hostility and anger into socially appropriate behaviors will improve ?Outcome: Progressing ?Goal: Ability to remain free from injury will improve ?Outcome: Progressing ?  ?Problem: Self-Care: ?Goal: Ability to participate in self-care as condition permits will improve ?Outcome: Progressing ?  ?Problem: Self-Concept: ?Goal: Will verbalize positive feelings about self ?Outcome: Progressing ?  ?

## 2021-11-10 DIAGNOSIS — F250 Schizoaffective disorder, bipolar type: Secondary | ICD-10-CM | POA: Diagnosis not present

## 2021-11-10 NOTE — Progress Notes (Signed)
Recreation Therapy Notes ? ?Date: 11/10/2021 ?  ?Time: 10:00 am   ?  ?Location: Court yard  ?  ?Behavioral response: N/A ?  ?Intervention Topic: Decision Making   ?  ?Discussion/Intervention: ?Patient refused to attend group.  ?  ?Clinical Observations/Feedback:  ?Patient refused to attend group.  ?  ?Wood Novacek LRT/CTRS ? ? ? ? ? ? ? ?Nyla Creason ?11/10/2021 10:19 AM ?

## 2021-11-10 NOTE — Plan of Care (Signed)
D- Patient alert and oriented. Patient presented in a depressed, but pleasant mood on assessment reporting that he slept "poor" last night and still had complaints of back pain. Patient rated his pain a "10/10", in which he requested PRN pain medication from this Clinical research associate. Patient endorsed SI, hopelessness, anxiety, and depression on his self-inventory. When this writer asked patient about why he's feeling this way, he stated "it's just my depression". Patient reported that he is "frustrated" with his family and that he has HI towards them and wants to "stay away from my family".  Patient also endorsed AVH to the previous shift's nurse, reporting that he is seeing shadows and hearing dog whistles. Per his self-inventory, patient's goal for today is to "try not to think about things to much, as family and what they say, try to make friends", in which he will "stay focus on me and try not to think about what my family says about me", in order to achieve his goal. Patient also wanted staff to know, per his self-inventory, is that "I really don't have no where to go/ I have been to most shelters in [place] and they have bed bugs". In regards to his discharge plan, patient wanted staff to know that he has "my personal stuff where I left it; Also, I just don't want to be around family".  ? ?A- Scheduled medications administered to patient, per MD orders. Support and encouragement provided.  Routine safety checks conducted every 15 minutes.  Patient informed to notify staff with problems or concerns. ? ?R- No adverse drug reactions noted. Patient contracts for safety at this time. Patient compliant with medications and treatment plan. Patient receptive, calm, and cooperative. Patient isolates to room, except for meals and medication. Patient remains safe at this time. ? ?Problem: Education: ?Goal: Knowledge of [facility] Education information/materials will improve ?Outcome: Not Progressing ?Goal: Emotional status will  improve ?Outcome: Not Progressing ?Goal: Mental status will improve ?Description: Patient will became more alert no confusion to place and situation  ?Outcome: Not Progressing ?Goal: Verbalization of understanding the information provided will improve ?Outcome: Not Progressing ?  ?Problem: Activity: ?Goal: Interest or engagement in activities will improve ?Outcome: Not Progressing ?Goal: Sleeping patterns will improve ?Outcome: Not Progressing ?  ?Problem: Coping: ?Goal: Ability to verbalize frustrations and anger appropriately will improve ?Description: Patient will verbalize frustration and anger to the staff appropriately.  ?Outcome: Not Progressing ?Goal: Ability to demonstrate self-control will improve ?Outcome: Not Progressing ?  ?Problem: Health Behavior/Discharge Planning: ?Goal: Identification of resources available to assist in meeting health care needs will improve ?Outcome: Not Progressing ?Goal: Compliance with treatment plan for underlying cause of condition will improve ?Outcome: Not Progressing ?  ?Problem: Physical Regulation: ?Goal: Ability to maintain clinical measurements within normal limits will improve ?Outcome: Not Progressing ?  ?Problem: Safety: ?Goal: Periods of time without injury will increase ?Outcome: Not Progressing ?  ?Problem: Activity: ?Goal: Will verbalize the importance of balancing activity with adequate rest periods ?Outcome: Not Progressing ?  ?Problem: Education: ?Goal: Will be free of psychotic symptoms ?Description: Patient will be free of psychosis  ?Outcome: Not Progressing ?Goal: Knowledge of the prescribed therapeutic regimen will improve ?Outcome: Not Progressing ?  ?Problem: Coping: ?Goal: Coping ability will improve ?Outcome: Not Progressing ?Goal: Will verbalize feelings ?Outcome: Not Progressing ?  ?Problem: Health Behavior/Discharge Planning: ?Goal: Compliance with prescribed medication regimen will improve ?Outcome: Not Progressing ?  ?Problem:  Nutritional: ?Goal: Ability to achieve adequate nutritional intake will improve ?  Outcome: Not Progressing ?  ?Problem: Role Relationship: ?Goal: Ability to communicate needs accurately will improve ?Outcome: Not Progressing ?Goal: Ability to interact with others will improve ?Outcome: Not Progressing ?  ?Problem: Safety: ?Goal: Ability to redirect hostility and anger into socially appropriate behaviors will improve ?Outcome: Not Progressing ?Goal: Ability to remain free from injury will improve ?Outcome: Not Progressing ?  ?Problem: Self-Care: ?Goal: Ability to participate in self-care as condition permits will improve ?Outcome: Not Progressing ?  ?Problem: Self-Concept: ?Goal: Will verbalize positive feelings about self ?Outcome: Not Progressing ?  ?

## 2021-11-10 NOTE — Progress Notes (Signed)
Patient is quiet and reserved. Isolative to his room this evening. Continues to endorse depression, anxiety, hopelessness and pain. Rating his back pain at 3/10 and doesn't require anything for it at the moment.  He is med compliant and received his meds without incident.  Will continue to monitor with q 15 minute safety rounds. Encouraged him to seek staff with any concerns that he may have. ? ? ? ? ?C Butler-Nicholson, LPN ?

## 2021-11-10 NOTE — Progress Notes (Signed)
[facility] ? ?11/10/2021 4:35 PM ?Timothy [name]  ?MRN:  [id number] ?Subjective: Follow-up patient with psychotic depression.  In bed most of the day today.  The headache is better but he is still feeling very bad.  Hallucinations are strong.  Depression is strong.  Suicidal thoughts happening but no action on it. ?Principal Problem: Schizoaffective disorder, bipolar type (HCC) ?Diagnosis: Principal Problem: ?  Schizoaffective disorder, bipolar type (HCC) ? ?Total Time spent with patient: 30 minutes ? ?Past Psychiatric History: Past history of schizoaffective disorder ? ?Past Medical History:  ?Past Medical History:  ?Diagnosis Date  ? Cannabis abuse   ? Paranoid schizophrenia (HCC)   ? Schizoaffective disorder, depressive type (HCC) 09/17/2014  ? History reviewed. No pertinent surgical history. ?Family History:  ?Family History  ?Problem Relation Age of Onset  ? Mental illness Brother   ? Drug abuse Brother   ? Mental illness Cousin   ? Suicidality Cousin   ? Diabetes Maternal Grandmother   ? ?Family Psychiatric  History: See previous ?Social History:  ?Social History  ? ?Substance and Sexual Activity  ?Alcohol Use Yes  ? Alcohol/week: 2.0 standard drinks  ? Types: 2 Cans of beer per week  ?   ?Social History  ? ?Substance and Sexual Activity  ?Drug Use Yes  ? 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  ?Social History  ? ?Socioeconomic History  ? Marital status: Single  ?  Spouse name: Not on file  ? Number of children: Not on file  ? Years of education: Not on file  ? Highest education level: Not on file  ?Occupational History  ? Not on file  ?Tobacco Use  ? Smoking status: Some Days  ?  Packs/day: 0.25  ?  Types: Cigarettes  ? Smokeless tobacco: Never  ?Vaping Use  ? Vaping Use: Never used  ?Substance and Sexual Activity  ? Alcohol use: Yes  ?  Alcohol/week: 2.0 standard drinks  ?  Types: 2 Cans of beer per week  ? Drug use: Yes  ?  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 Sexual activity: Yes  ?Other Topics Concern   ? Not on file  ?Social History Narrative  ? Not on file  ? ?Social Determinants of Health  ? ?Financial Resource Strain: Not on file  ?Food Insecurity: Not on file  ?Transportation Needs: Not on file  ?Physical Activity: Not on file  ?Stress: Not on file  ?Social Connections: Not on file  ? ?Additional Social History:  ?  ?  ?  ?  ?  ?  ?  ?  ?  ?  ?  ? ?Sleep: Fair ? ?Appetite:  Fair ? ?Current Medications: ?Current Facility-Administered Medications  ?Medication Dose Route Frequency Provider Last Rate Last Admin  ? acetaminophen (TYLENOL) tablet 650 mg  650 mg Oral Q6H PRN Charm RingsLord, Jamison Y, NP   650 mg at 11/10/21 [id number]  ? alum & mag hydroxide-simeth (MAALOX/MYLANTA) 200-200-20 MG/5ML suspension 30 mL  30 mL Oral Q4H PRN Charm RingsLord, Jamison Y, NP      ? buPROPion (WELLBUTRIN XL) 24 hr tablet 150 mg  150 mg Oral Daily Mitsuo Budnick, Jackquline DenmarkJohn T, MD   150 mg at 11/10/21 [id number]  ? divalproex (DEPAKOTE) DR tablet 500 mg  500 mg Oral Q12H Charm RingsLord, Jamison Y, NP   500 mg at 11/10/21 [id number]  ? FLUoxetine (PROZAC) capsule 20 mg  20 mg Oral Daily Charm RingsLord, Jamison Y, NP   20 mg at 11/10/21 [id number]  ? ibuprofen (  ADVIL) tablet 600 mg  600 mg Oral Q6H PRN [place] Hanlon T, MD   600 mg at 11/10/21 6283  ? magnesium hydroxide (MILK OF MAGNESIA) suspension 30 mL  30 mL Oral Daily PRN Charm Rings, NP      ? nicotine (NICODERM CQ - dosed in mg/24 hours) patch 14 mg  14 mg Transdermal Daily Aleeya Veitch, Jackquline Denmark, MD   14 mg at 11/10/21 1517  ? QUEtiapine (SEROQUEL) tablet 50 mg  50 mg Oral BID Charm Rings, NP   50 mg at 11/10/21 6160  ? QUEtiapine (SEROQUEL) tablet 600 mg  600 mg Oral QHS Deedra Pro, Jackquline Denmark, MD   600 mg at 11/09/21 2227  ? ? ?Lab Results: No results found for this or any previous visit (from the past 48 hour(s)). ? ?Blood Alcohol level:  ?Lab Results  ?Component Value Date  ? ETH <10 10/31/2021  ? ETH <10 06/30/2021  ? ? ?Metabolic Disorder Labs: ?Lab Results  ?Component Value Date  ? HGBA1C 5.5 11/04/2021  ? MPG 111.15 11/04/2021  ? MPG 114  06/17/2021  ? ?Lab Results  ?Component Value Date  ? PROLACTIN 33.2 (H) 08/15/2016  ? ?Lab Results  ?Component Value Date  ? CHOL 149 11/04/2021  ? TRIG 56 11/04/2021  ? HDL 35 (L) 11/04/2021  ? CHOLHDL 4.3 11/04/2021  ? VLDL 11 11/04/2021  ? LDLCALC 103 (H) 11/04/2021  ? LDLCALC 111 (H) 06/17/2021  ? ? ?Physical Findings: ?AIMS:  , ,  ,  ,    ?CIWA:    ?COWS:    ? ?Musculoskeletal: ?Strength & Muscle Tone: within normal limits ?Gait & Station: normal ?Patient leans: N/A ? ?Psychiatric Specialty Exam: ? ?Presentation  ?General Appearance: Appropriate for Environment ? ?Eye Contact:Fair ? ?Speech:Normal Rate ? ?Speech Volume:Normal ? ?Handedness:Right ? ? ?Mood and Affect  ?Mood:Euthymic ? ?Affect:Appropriate ? ? ?Thought Process  ?Thought Processes:Coherent ? ?Descriptions of Associations:Intact ? ?Orientation:Full (Time, Place and Person) ? ?Thought Content:Logical ? ?History of Schizophrenia/Schizoaffective disorder:Yes ? ?Duration of Psychotic Symptoms:Greater than six months ? ?Hallucinations:No data recorded ?Ideas of Reference:None ? ?Suicidal Thoughts:No data recorded ?Homicidal Thoughts:No data recorded ? ?Sensorium  ?Memory:Immediate Good; Recent Good; Remote Good ? ?Judgment:Fair ? ?Insight:Fair ? ? ?Executive Functions  ?Concentration:Good ? ?Attention Span:Good ? ?Recall:Fair ? ?Fund of Knowledge:Fair ? ?Language:Good ? ? ?Psychomotor Activity  ?Psychomotor Activity:No data recorded ? ?Assets  ?Assets:Communication Skills; Desire for Improvement; Financial Resources/Insurance; Resilience; Social Support ? ? ?Sleep  ?Sleep:No data recorded ? ? ?Physical Exam: ?Physical Exam ?Vitals and nursing note reviewed.  ?Constitutional:   ?   Appearance: Normal appearance.  ?HENT:  ?   Head: Normocephalic and atraumatic.  ?   Mouth/Throat:  ?   Pharynx: Oropharynx is clear.  ?Eyes:  ?   Pupils: Pupils are equal, round, and reactive to light.  ?Cardiovascular:  ?   Rate and Rhythm: Normal rate and regular rhythm.   ?Pulmonary:  ?   Effort: Pulmonary effort is normal.  ?   Breath sounds: Normal breath sounds.  ?Abdominal:  ?   General: Abdomen is flat.  ?   Palpations: Abdomen is soft.  ?Musculoskeletal:     ?   General: Normal range of motion.  ?Skin: ?   General: Skin is warm and dry.  ?Neurological:  ?   General: No focal deficit present.  ?   Mental Status: He is alert. Mental status is at baseline.  ?Psychiatric:     ?   Attention  and Perception: Attention normal. He perceives auditory hallucinations.     ?   Mood and Affect: Mood is depressed. Affect is blunt.     ?   Speech: Speech is delayed.     ?   Behavior: Behavior is slowed.     ?   Thought Content: Thought content includes suicidal ideation.     ?   Cognition and Memory: Cognition is impaired.  ? ?Review of Systems  ?Constitutional: Negative.   ?HENT: Negative.    ?Eyes: Negative.   ?Respiratory: Negative.    ?Cardiovascular: Negative.   ?Gastrointestinal: Negative.   ?Musculoskeletal: Negative.   ?Skin: Negative.   ?Neurological: Negative.   ?Psychiatric/Behavioral:  Positive for depression, hallucinations and suicidal ideas.   ?Blood pressure 98/73, pulse 80, temperature 97.6 ?F (36.4 ?C), temperature source Oral, resp. rate 18, height 6\' 3"  (1.905 m), weight 93.4 kg, SpO2 98 %. Body mass index is 25.74 kg/m?. ? ? ?Treatment Plan Summary: ?Medication management and Plan unfortunately still doing pretty badly despite all the medicine.  I brought up the subject of ECT with him today and just reviewed the basics.  He actually seemed to be pretty receptive.  We would not have any space tomorrow but I will continue talking with him and we may be able to start ECT Friday. ? ?Saturday, MD ?11/10/2021, 4:35 PM ? ?

## 2021-11-10 NOTE — Group Note (Signed)
[facility] LCSW Group Therapy Note ? ? ?Group Date: 11/10/2021 ?Start Time: 1300 ?End Time: 1400 ? ?Type of Therapy/Topic:  Group Therapy:  Feelings about Diagnosis ? ?Participation Level:  Did Not Attend  ? ?Description of Group:   ? This group will allow patients to explore their thoughts and feelings about diagnoses they have received. Patients will be guided to explore their level of understanding and acceptance of these diagnoses. Facilitator will encourage patients to process their thoughts and feelings about the reactions of others to their diagnosis, and will guide patients in identifying ways to discuss their diagnosis with significant others in their lives. This group will be process-oriented, with patients participating in exploration of their own experiences as well as giving and receiving support and challenge from other group members. ? ? ?Therapeutic Goals: ?1. Patient will demonstrate understanding of diagnosis as evidence by identifying two or more symptoms of the disorder:  ?2. Patient will be able to express two feelings regarding the diagnosis ?3. Patient will demonstrate ability to communicate their needs through discussion and/or role plays ? ?Summary of Patient Progress: ?X ? ? ?Therapeutic Modalities:   ?Cognitive Behavioral Therapy ?Brief Therapy ?Feelings Identification  ? ? ?Yanelle Sousa R Alayah Knouff, LCSW ?

## 2021-11-10 NOTE — Plan of Care (Signed)
?  Problem: Activity: ?Goal: Interest or engagement in activities will improve ?Outcome: Progressing ?Goal: Sleeping patterns will improve ?Outcome: Progressing ?  ?Problem: Activity: ?Goal: Sleeping patterns will improve ?Outcome: Progressing ?  ?Problem: Education: ?Goal: Knowledge of the prescribed therapeutic regimen will improve ?Outcome: Progressing ?  ?Problem: Coping: ?Goal: Coping ability will improve ?Outcome: Progressing ?Goal: Will verbalize feelings ?Outcome: Progressing ?  ?

## 2021-11-11 DIAGNOSIS — F250 Schizoaffective disorder, bipolar type: Secondary | ICD-10-CM | POA: Diagnosis not present

## 2021-11-11 NOTE — Progress Notes (Signed)
Recreation Therapy Notes ? ?  ?Date: 11/11/2021 ?  ?Time: 10:15 am   ?  ?Location: Courtyard    ?  ?Behavioral response: Appropriate ?  ?Intervention Topic: Leisure   ?  ?Discussion/Intervention:  ?Group content today was focused on leisure. The group defined what leisure is and some positive leisure activities they participate in. Individuals identified the difference between good and bad leisure. Participants expressed how they feel after participating in the leisure of their choice. The group discussed how they go about picking a leisure activity and if others are involved in their leisure activities. The patient stated how many leisure activities they have to choose from and reasons why it is important to have leisure time. Individuals participated in the intervention ?Exploration of Leisure? where they had a chance to identify new leisure activities as well as benefits of leisure. ?Clinical Observations/Feedback: ?Patient came to group late and was able to explore participate in many leisure activities. Participant was able to identify leisure activities they enjoy outside of the hospital. Individual was social with peers and staff while participating in the intervention.    ?Kayse Puccini LRT/CTRS  ? ? ? ? ? ? ? ?Daissy Yerian ?11/11/2021 12:07 PM ?

## 2021-11-11 NOTE — Progress Notes (Signed)
Patient is med compliant this evening. Received meds without incident. He continues to endorse both auditory and visual hallucination.  In addition he expresses anxiety depression and hopelessness. Rated his back pain at 3/10 and does not require any medication at this time for pain. Will continue to monitor with q 15 minute safety rounds.  Encouraged him to seek staff with any concerns he may have. ? ? ? ? ? C Butler-Nicholson, LPN ?

## 2021-11-11 NOTE — Progress Notes (Signed)
Patient requesting medication for back pain.  Rating his pain at 7/10. Received Tylenol to help with symptoms.  Will continue to monitor fr any changes. ? ? ? ? ? ?C Butler-Nicholson, LPN ?

## 2021-11-11 NOTE — Plan of Care (Signed)
  Problem: Coping Skills Goal: STG - Patient will identify 3 positive coping skills strategies to use post d/c within 5 recreation therapy group sessions Description: STG - Patient will identify 3 positive coping skills strategies to use post d/c within 5 recreation therapy group sessions Outcome: Progressing   

## 2021-11-11 NOTE — Progress Notes (Signed)
[facility] MD Progress Note ? ?11/11/2021 5:42 PM ?Timothy [name]  ?MRN:  [id number] ?Subjective: Follow-up with patient with schizoaffective disorder with symptoms of psychotic depression.  Up out of bed more today but still very withdrawn.  Continues to endorse depression with suicidal thoughts and hallucinations ?Principal Problem: Schizoaffective disorder, bipolar type (HCC) ?Diagnosis: Principal Problem: ?  Schizoaffective disorder, bipolar type (HCC) ? ?Total Time spent with patient: 30 minutes ? ?Past Psychiatric History: Past history of schizoaffective disorder usually presenting with depression ? ?Past Medical History:  ?Past Medical History:  ?Diagnosis Date  ? Cannabis abuse   ? Paranoid schizophrenia (HCC)   ? Schizoaffective disorder, depressive type (HCC) 09/17/2014  ? History reviewed. No pertinent surgical history. ?Family History:  ?Family History  ?Problem Relation Age of Onset  ? Mental illness Brother   ? Drug abuse Brother   ? Mental illness Cousin   ? Suicidality Cousin   ? Diabetes Maternal Grandmother   ? ?Family Psychiatric  History: See previous ?Social History:  ?Social History  ? ?Substance and Sexual Activity  ?Alcohol Use Yes  ? Alcohol/week: 2.0 standard drinks  ? Types: 2 Cans of beer per week  ?   ?Social History  ? ?Substance and Sexual Activity  ?Drug Use Yes  ? 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  ?Social History  ? ?Socioeconomic History  ? Marital status: Single  ?  Spouse name: Not on file  ? Number of children: Not on file  ? Years of education: Not on file  ? Highest education level: Not on file  ?Occupational History  ? Not on file  ?Tobacco Use  ? Smoking status: Some Days  ?  Packs/day: 0.25  ?  Types: Cigarettes  ? Smokeless tobacco: Never  ?Vaping Use  ? Vaping Use: Never used  ?Substance and Sexual Activity  ? Alcohol use: Yes  ?  Alcohol/week: 2.0 standard drinks  ?  Types: 2 Cans of beer per week  ? Drug use: Yes  ?  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 Sexual activity: Yes   ?Other Topics Concern  ? Not on file  ?Social History Narrative  ? Not on file  ? ?Social Determinants of Health  ? ?Financial Resource Strain: Not on file  ?Food Insecurity: Not on file  ?Transportation Needs: Not on file  ?Physical Activity: Not on file  ?Stress: Not on file  ?Social Connections: Not on file  ? ?Additional Social History:  ?  ?  ?  ?  ?  ?  ?  ?  ?  ?  ?  ? ?Sleep: Fair ? ?Appetite:  Fair ? ?Current Medications: ?Current Facility-Administered Medications  ?Medication Dose Route Frequency Provider Last Rate Last Admin  ? acetaminophen (TYLENOL) tablet 650 mg  650 mg Oral Q6H PRN Charm Rings, NP   650 mg at 11/10/21 0488  ? alum & mag hydroxide-simeth (MAALOX/MYLANTA) 200-200-20 MG/5ML suspension 30 mL  30 mL Oral Q4H PRN Charm Rings, NP      ? buPROPion (WELLBUTRIN XL) 24 hr tablet 150 mg  150 mg Oral Daily Mayanna Garlitz, Jackquline Denmark, MD   150 mg at 11/11/21 8916  ? divalproex (DEPAKOTE) DR tablet 500 mg  500 mg Oral Q12H Charm Rings, NP   500 mg at 11/11/21 9450  ? FLUoxetine (PROZAC) capsule 20 mg  20 mg Oral Daily Charm Rings, NP   20 mg at 11/11/21 3888  ? ibuprofen (ADVIL) tablet 600 mg  600  mg Oral Q6H PRN Keli Buehner T, MD   600 mg at 11/10/21 4128  ? magnesium hydroxide (MILK OF MAGNESIA) suspension 30 mL  30 mL Oral Daily PRN Charm Rings, NP      ? nicotine (NICODERM CQ - dosed in mg/24 hours) patch 14 mg  14 mg Transdermal Daily Navid Lenzen, Jackquline Denmark, MD   14 mg at 11/11/21 7867  ? QUEtiapine (SEROQUEL) tablet 50 mg  50 mg Oral BID Charm Rings, NP   50 mg at 11/11/21 1656  ? QUEtiapine (SEROQUEL) tablet 600 mg  600 mg Oral QHS Izabell Schalk, Jackquline Denmark, MD   600 mg at 11/10/21 2127  ? ? ?Lab Results: No results found for this or any previous visit (from the past 48 hour(s)). ? ?Blood Alcohol level:  ?Lab Results  ?Component Value Date  ? ETH <10 10/31/2021  ? ETH <10 06/30/2021  ? ? ?Metabolic Disorder Labs: ?Lab Results  ?Component Value Date  ? HGBA1C 5.5 11/04/2021  ? MPG 111.15  11/04/2021  ? MPG 114 06/17/2021  ? ?Lab Results  ?Component Value Date  ? PROLACTIN 33.2 (H) 08/15/2016  ? ?Lab Results  ?Component Value Date  ? CHOL 149 11/04/2021  ? TRIG 56 11/04/2021  ? HDL 35 (L) 11/04/2021  ? CHOLHDL 4.3 11/04/2021  ? VLDL 11 11/04/2021  ? LDLCALC 103 (H) 11/04/2021  ? LDLCALC 111 (H) 06/17/2021  ? ? ?Physical Findings: ?AIMS:  , ,  ,  ,    ?CIWA:    ?COWS:    ? ?Musculoskeletal: ?Strength & Muscle Tone: within normal limits ?Gait & Station: normal ?Patient leans: N/A ? ?Psychiatric Specialty Exam: ? ?Presentation  ?General Appearance: Appropriate for Environment ? ?Eye Contact:Fair ? ?Speech:Normal Rate ? ?Speech Volume:Normal ? ?Handedness:Right ? ? ?Mood and Affect  ?Mood:Euthymic ? ?Affect:Appropriate ? ? ?Thought Process  ?Thought Processes:Coherent ? ?Descriptions of Associations:Intact ? ?Orientation:Full (Time, Place and Person) ? ?Thought Content:Logical ? ?History of Schizophrenia/Schizoaffective disorder:Yes ? ?Duration of Psychotic Symptoms:Greater than six months ? ?Hallucinations:No data recorded ?Ideas of Reference:None ? ?Suicidal Thoughts:No data recorded ?Homicidal Thoughts:No data recorded ? ?Sensorium  ?Memory:Immediate Good; Recent Good; Remote Good ? ?Judgment:Fair ? ?Insight:Fair ? ? ?Executive Functions  ?Concentration:Good ? ?Attention Span:Good ? ?Recall:Fair ? ?Fund of Knowledge:Fair ? ?Language:Good ? ? ?Psychomotor Activity  ?Psychomotor Activity:No data recorded ? ?Assets  ?Assets:Communication Skills; Desire for Improvement; Financial Resources/Insurance; Resilience; Social Support ? ? ?Sleep  ?Sleep:No data recorded ? ? ?Physical Exam: ?Physical Exam ?Vitals and nursing note reviewed.  ?Constitutional:   ?   Appearance: Normal appearance.  ?HENT:  ?   Head: Normocephalic and atraumatic.  ?   Mouth/Throat:  ?   Pharynx: Oropharynx is clear.  ?Eyes:  ?   Pupils: Pupils are equal, round, and reactive to light.  ?Cardiovascular:  ?   Rate and Rhythm: Normal rate  and regular rhythm.  ?Pulmonary:  ?   Effort: Pulmonary effort is normal.  ?   Breath sounds: Normal breath sounds.  ?Abdominal:  ?   General: Abdomen is flat.  ?   Palpations: Abdomen is soft.  ?Musculoskeletal:     ?   General: Normal range of motion.  ?Skin: ?   General: Skin is warm and dry.  ?Neurological:  ?   General: No focal deficit present.  ?   Mental Status: He is alert. Mental status is at baseline.  ?Psychiatric:     ?   Attention and Perception: Attention normal.     ?  Mood and Affect: Mood is depressed.     ?   Speech: Speech is delayed.     ?   Behavior: Behavior is slowed.     ?   Thought Content: Thought content includes suicidal ideation.     ?   Cognition and Memory: Cognition normal.     ?   Judgment: Judgment normal.  ? ?Review of Systems  ?Constitutional: Negative.   ?HENT: Negative.    ?Eyes: Negative.   ?Respiratory: Negative.    ?Cardiovascular: Negative.   ?Gastrointestinal: Negative.   ?Musculoskeletal: Negative.   ?Skin: Negative.   ?Neurological: Negative.   ?Psychiatric/Behavioral:  Positive for depression, hallucinations and suicidal ideas.   ?Blood pressure 121/76, pulse 79, temperature 98.1 ?F (36.7 ?C), temperature source Oral, resp. rate 18, height 6\' 3"  (1.905 m), weight 93.4 kg, SpO2 98 %. Body mass index is 25.74 kg/m?. ? ? ?Treatment Plan Summary: ?Plan no change to current medicine.  Supportive counseling and review of plan.  Anticipate starting ECT treatment on Friday ? ?Mordecai RasmussenJohn Azaria Stegman, MD ?11/11/2021, 5:42 PM ? ?

## 2021-11-11 NOTE — Plan of Care (Signed)
?  Problem: Activity: ?Goal: Interest or engagement in activities will improve ?Outcome: Progressing ?Goal: Sleeping patterns will improve ?Outcome: Progressing ?  ?Problem: Education: ?Goal: Knowledge of [facility] Education information/materials will improve ?Outcome: Progressing ?Goal: Emotional status will improve ?Outcome: Progressing ?Goal: Mental status will improve ?Description: Patient will became more alert no confusion to place and situation  ?Outcome: Progressing ?Goal: Verbalization of understanding the information provided will improve ?Outcome: Progressing ?  ?Problem: Self-Concept: ?Goal: Will verbalize positive feelings about self ?Outcome: Progressing ?  ?Problem: Self-Care: ?Goal: Ability to participate in self-care as condition permits will improve ?Outcome: Progressing ?  ?Problem: Safety: ?Goal: Ability to redirect hostility and anger into socially appropriate behaviors will improve ?Outcome: Progressing ?Goal: Ability to remain free from injury will improve ?Outcome: Progressing ?  ?Problem: Role Relationship: ?Goal: Ability to communicate needs accurately will improve ?Outcome: Progressing ?Goal: Ability to interact with others will improve ?Outcome: Progressing ?  ?

## 2021-11-11 NOTE — Plan of Care (Signed)
Patient more visible in the milieu watching TV but minimal interactions with peers. Patient verbalized passive SI and positive for AVH. Patient states that he is seeing some shadows and hearing some noises. Patient contracts for safety. Patient likes to try ECT. Appetite and energy level good. Support and encouragement given. ?

## 2021-11-11 NOTE — Group Note (Signed)
[facility] LCSW Group Therapy Note ? ? ? ?Group Date: 11/11/2021 ?Start Time: 1330 ?End Time: 1430 ? ?Type of Therapy and Topic:  Group Therapy:  Overcoming Obstacles ? ?Participation Level:  [facility] PARTICIPATION LEVEL: Active ? ?Mood: ? ?Description of Group:   ?In this group patients will be encouraged to explore what they see as obstacles to their own wellness and recovery. They will be guided to discuss their thoughts, feelings, and behaviors related to these obstacles. The group will process together ways to cope with barriers, with attention given to specific choices patients can make. Each patient will be challenged to identify changes they are motivated to make in order to overcome their obstacles. This group will be process-oriented, with patients participating in exploration of their own experiences as well as giving and receiving support and challenge from other group members. ? ?Therapeutic Goals: ?1. Patient will identify personal and current obstacles as they relate to admission. ?2. Patient will identify barriers that currently interfere with their wellness or overcoming obstacles.  ?3. Patient will identify feelings, thought process and behaviors related to these barriers. ?4. Patient will identify two changes they are willing to make to overcome these obstacles:  ? ? ?Summary of Patient Progress ?Patient was present in group.  Patient was active in group. 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  Patient shared that he has struggled with homelessness as his main obstacle.   ? ? ?Therapeutic Modalities:   ?Cognitive Behavioral Therapy ?Solution Focused Therapy ?Motivational Interviewing ?Relapse Prevention Therapy ? ? ?Harden Mo, LCSW ?

## 2021-11-12 ENCOUNTER — Other Ambulatory Visit: Payer: Self-pay | Admitting: Psychiatry

## 2021-11-12 DIAGNOSIS — F250 Schizoaffective disorder, bipolar type: Secondary | ICD-10-CM | POA: Diagnosis not present

## 2021-11-12 NOTE — Progress Notes (Signed)
[facility] MD Progress Note ? ?11/12/2021 3:15 PM ?Timothy [name]  ?MRN:  [id number] ?Subjective: Follow-up for this 26 year old man with schizoaffective disorder.  Patient seen and chart reviewed.  Patient remains depressed and withdrawn.  Continues to have auditory hallucinations and suicidal ideation.  No new physical complaints. ?Principal Problem: Schizoaffective disorder, bipolar type (HCC) ?Diagnosis: Principal Problem: ?  Schizoaffective disorder, bipolar type (HCC) ? ?Total Time spent with patient: 30 minutes ? ?Past Psychiatric History: Past history of recurrent depression and psychosis ? ?Past Medical History:  ?Past Medical History:  ?Diagnosis Date  ? Cannabis abuse   ? Paranoid schizophrenia (HCC)   ? Schizoaffective disorder, depressive type (HCC) 09/17/2014  ? History reviewed. No pertinent surgical history. ?Family History:  ?Family History  ?Problem Relation Age of Onset  ? Mental illness Brother   ? Drug abuse Brother   ? Mental illness Cousin   ? Suicidality Cousin   ? Diabetes Maternal Grandmother   ? ?Family Psychiatric  History: See previous ?Social History:  ?Social History  ? ?Substance and Sexual Activity  ?Alcohol Use Yes  ? Alcohol/week: 2.0 standard drinks  ? Types: 2 Cans of beer per week  ?   ?Social History  ? ?Substance and Sexual Activity  ?Drug Use Yes  ? 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  ?Social History  ? ?Socioeconomic History  ? Marital status: Single  ?  Spouse name: Not on file  ? Number of children: Not on file  ? Years of education: Not on file  ? Highest education level: Not on file  ?Occupational History  ? Not on file  ?Tobacco Use  ? Smoking status: Some Days  ?  Packs/day: 0.25  ?  Types: Cigarettes  ? Smokeless tobacco: Never  ?Vaping Use  ? Vaping Use: Never used  ?Substance and Sexual Activity  ? Alcohol use: Yes  ?  Alcohol/week: 2.0 standard drinks  ?  Types: 2 Cans of beer per week  ? Drug use: Yes  ?  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 Sexual activity: Yes  ?Other  Topics Concern  ? Not on file  ?Social History Narrative  ? Not on file  ? ?Social Determinants of Health  ? ?Financial Resource Strain: Not on file  ?Food Insecurity: Not on file  ?Transportation Needs: Not on file  ?Physical Activity: Not on file  ?Stress: Not on file  ?Social Connections: Not on file  ? ?Additional Social History:  ?  ?  ?  ?  ?  ?  ?  ?  ?  ?  ?  ? ?Sleep: Fair ? ?Appetite:  Fair ? ?Current Medications: ?Current Facility-Administered Medications  ?Medication Dose Route Frequency Provider Last Rate Last Admin  ? acetaminophen (TYLENOL) tablet 650 mg  650 mg Oral Q6H PRN Charm Rings, NP   650 mg at 11/12/21 0816  ? alum & mag hydroxide-simeth (MAALOX/MYLANTA) 200-200-20 MG/5ML suspension 30 mL  30 mL Oral Q4H PRN Charm Rings, NP      ? buPROPion (WELLBUTRIN XL) 24 hr tablet 150 mg  150 mg Oral Daily Rosaly Labarbera, Jackquline Denmark, MD   150 mg at 11/12/21 0816  ? divalproex (DEPAKOTE) DR tablet 500 mg  500 mg Oral Q12H Charm Rings, NP   500 mg at 11/12/21 0816  ? FLUoxetine (PROZAC) capsule 20 mg  20 mg Oral Daily Charm Rings, NP   20 mg at 11/12/21 0816  ? ibuprofen (ADVIL) tablet 600 mg  600  mg Oral Q6H PRN Braxden Lovering T, MD   600 mg at 11/10/21 [id number]  ? magnesium hydroxide (MILK OF MAGNESIA) suspension 30 mL  30 mL Oral Daily PRN Charm RingsLord, Jamison Y, NP      ? nicotine (NICODERM CQ - dosed in mg/24 hours) patch 14 mg  14 mg Transdermal Daily Adisa Vigeant, Jackquline DenmarkJohn T, MD   14 mg at 11/12/21 0820  ? QUEtiapine (SEROQUEL) tablet 50 mg  50 mg Oral BID Charm RingsLord, Jamison Y, NP   50 mg at 11/12/21 [id number]  ? QUEtiapine (SEROQUEL) tablet 600 mg  600 mg Oral QHS Mckynleigh Mussell, Jackquline DenmarkJohn T, MD   600 mg at 11/11/21 2109  ? ? ?Lab Results: No results found for this or any previous visit (from the past 48 hour(s)). ? ?Blood Alcohol level:  ?Lab Results  ?Component Value Date  ? ETH <10 10/31/2021  ? ETH <10 06/30/2021  ? ? ?Metabolic Disorder Labs: ?Lab Results  ?Component Value Date  ? HGBA1C 5.5 11/04/2021  ? MPG 111.15 11/04/2021  ?  MPG 114 06/17/2021  ? ?Lab Results  ?Component Value Date  ? PROLACTIN 33.2 (H) 08/15/2016  ? ?Lab Results  ?Component Value Date  ? CHOL 149 11/04/2021  ? TRIG 56 11/04/2021  ? HDL 35 (L) 11/04/2021  ? CHOLHDL 4.3 11/04/2021  ? VLDL 11 11/04/2021  ? LDLCALC 103 (H) 11/04/2021  ? LDLCALC 111 (H) 06/17/2021  ? ? ?Physical Findings: ?AIMS:  , ,  ,  ,    ?CIWA:    ?COWS:    ? ?Musculoskeletal: ?Strength & Muscle Tone: within normal limits ?Gait & Station: normal ?Patient leans: N/A ? ?Psychiatric Specialty Exam: ? ?Presentation  ?General Appearance: Appropriate for Environment ? ?Eye Contact:Fair ? ?Speech:Normal Rate ? ?Speech Volume:Normal ? ?Handedness:Right ? ? ?Mood and Affect  ?Mood:Euthymic ? ?Affect:Appropriate ? ? ?Thought Process  ?Thought Processes:Coherent ? ?Descriptions of Associations:Intact ? ?Orientation:Full (Time, Place and Person) ? ?Thought Content:Logical ? ?History of Schizophrenia/Schizoaffective disorder:Yes ? ?Duration of Psychotic Symptoms:Greater than six months ? ?Hallucinations:No data recorded ?Ideas of Reference:None ? ?Suicidal Thoughts:No data recorded ?Homicidal Thoughts:No data recorded ? ?Sensorium  ?Memory:Immediate Good; Recent Good; Remote Good ? ?Judgment:Fair ? ?Insight:Fair ? ? ?Executive Functions  ?Concentration:Good ? ?Attention Span:Good ? ?Recall:Fair ? ?Fund of Knowledge:Fair ? ?Language:Good ? ? ?Psychomotor Activity  ?Psychomotor Activity:No data recorded ? ?Assets  ?Assets:Communication Skills; Desire for Improvement; Financial Resources/Insurance; Resilience; Social Support ? ? ?Sleep  ?Sleep:No data recorded ? ? ?Physical Exam: ?Physical Exam ?Vitals and nursing note reviewed.  ?Constitutional:   ?   Appearance: Normal appearance.  ?HENT:  ?   Head: Normocephalic and atraumatic.  ?   Mouth/Throat:  ?   Pharynx: Oropharynx is clear.  ?Eyes:  ?   Pupils: Pupils are equal, round, and reactive to light.  ?Cardiovascular:  ?   Rate and Rhythm: Normal rate and regular  rhythm.  ?Pulmonary:  ?   Effort: Pulmonary effort is normal.  ?   Breath sounds: Normal breath sounds.  ?Abdominal:  ?   General: Abdomen is flat.  ?   Palpations: Abdomen is soft.  ?Musculoskeletal:     ?   General: Normal range of motion.  ?Skin: ?   General: Skin is warm and dry.  ?Neurological:  ?   General: No focal deficit present.  ?   Mental Status: He is alert. Mental status is at baseline.  ?Psychiatric:     ?   Attention and Perception: Attention normal. He perceives  auditory hallucinations.     ?   Mood and Affect: Mood is depressed.     ?   Speech: Speech normal.     ?   Behavior: Behavior is cooperative.     ?   Thought Content: Thought content normal.     ?   Cognition and Memory: Cognition normal.     ?   Judgment: Judgment normal.  ? ?Review of Systems  ?Constitutional: Negative.   ?HENT: Negative.    ?Eyes: Negative.   ?Respiratory: Negative.    ?Cardiovascular: Negative.   ?Gastrointestinal: Negative.   ?Musculoskeletal: Negative.   ?Skin: Negative.   ?Neurological: Negative.   ?Psychiatric/Behavioral:  Positive for depression, hallucinations and suicidal ideas. Negative for substance abuse.   ?Blood pressure 115/79, pulse 79, temperature (!) 97.5 ?F (36.4 ?C), resp. rate 18, height 6\' 3"  (1.905 m), weight 93.4 kg, SpO2 100 %. Body mass index is 25.74 kg/m?. ? ? ?Treatment Plan Summary: ?Plan patient agrees to plan for ECT starting tomorrow.  Recommend bilateral ECT.  Patient reviewed with me risks and benefits.  We will go ahead and put in n.p.o. order and.  No other change to medicine I have put in an official order for him to have double portions ? ? , MD ?11/12/2021, 3:15 PM ? ?

## 2021-11-12 NOTE — Progress Notes (Signed)
D: Patient alert and oriented. Patient rates pain 7/10 for back pain. PRN tylenol provided to patient. Patient denies anxiety. Patient rates depression 10/10. Patient states "I just have to find my own way" Patient denies SI and AVH. Patient endorses HI towards family.  ?Patient isolative to room during shift with the exception to coming out for meals. ? ?A: Scheduled medications administered to patient, per MD orders. Support and encouragement provided to patient.  ?Q15 minute safety checks maintained.  ? ?R: Patient compliant with medication administration and treatment plan. No adverse drug reactions noted. Patient remains safe on the unit at this time.  ?

## 2021-11-12 NOTE — Progress Notes (Signed)
Recreation Therapy Notes ? ?Date: 11/12/2021 ?  ?Time: 10:30 am   ?  ?Location: Court yard  ?  ?Behavioral response: N/A ?  ?Intervention Topic: Social Skills  ?  ?Discussion/Intervention: ?Patient refused to attend group.  ?  ?Clinical Observations/Feedback:  ?Patient refused to attend group.  ?  ?[name] LRT/CTRS ?  ?  ? ? ? ? ? ? ?Timothy [name] ?11/12/2021 12:01 PM ? ? ? ? ? ? ? ?Timothy [name] ?11/12/2021 12:00 PM ?

## 2021-11-12 NOTE — Group Note (Signed)
[facility] LCSW Group Therapy Note ? ? ? ?Group Date: 11/12/2021 ?Start Time: 1300 ?End Time: 1400 ? ?Type of Therapy and Topic:  Group Therapy:  Overcoming Obstacles ? ?Participation Level:  [facility] PARTICIPATION LEVEL: Active ? ?Description of Group:   ?In this group patients will be encouraged to explore what they see as obstacles to their own wellness and recovery. They will be guided to discuss their thoughts, feelings, and behaviors related to these obstacles. The group will process together ways to cope with barriers, with attention given to specific choices patients can make. Each patient will be challenged to identify changes they are motivated to make in order to overcome their obstacles. This group will be process-oriented, with patients participating in exploration of their own experiences as well as giving and receiving support and challenge from other group members. ? ?Therapeutic Goals: ?1. Patient will identify personal and current obstacles as they relate to admission. ?2. Patient will identify barriers that currently interfere with their wellness or overcoming obstacles.  ?3. Patient will identify feelings, thought process and behaviors related to these barriers. ?4. Patient will identify two changes they are willing to make to overcome these obstacles:  ? ? ?Summary of Patient Progress ? ? ?Patient was present for the entirety of the group session. Patient was an active listener and participated in the topic of discussion, provided helpful advice to others, and added nuance to topic of conversation. Patient states his obstacles are depression and transportation to/from mental health appointments. CSW and others in the group provided potential solutions to obstacles.  ? ? ?Therapeutic Modalities:   ?Cognitive Behavioral Therapy ?Solution Focused Therapy ?Motivational Interviewing ?Relapse Prevention Therapy ? ? ?Corky Crafts, LCSWA ?

## 2021-11-12 NOTE — BH IP Treatment Plan (Signed)
Interdisciplinary Treatment and Diagnostic Plan Update ? ?11/12/2021 ?Time of Session: 0930 ?Timothy [name] ?MRN: [id number] ? ?Principal Diagnosis: Schizoaffective disorder, bipolar type (HCC) ? ?Secondary Diagnoses: Principal Problem: ?  Schizoaffective disorder, bipolar type (HCC) ? ? ?Current Medications:  ?Current Facility-Administered Medications  ?Medication Dose Route Frequency Provider Last Rate Last Admin  ? acetaminophen (TYLENOL) tablet 650 mg  650 mg Oral Q6H PRN Charm Rings, NP   650 mg at 11/12/21 0816  ? alum & mag hydroxide-simeth (MAALOX/MYLANTA) 200-200-20 MG/5ML suspension 30 mL  30 mL Oral Q4H PRN Charm Rings, NP      ? buPROPion (WELLBUTRIN XL) 24 hr tablet 150 mg  150 mg Oral Daily Clapacs, Jackquline Denmark, MD   150 mg at 11/12/21 0816  ? divalproex (DEPAKOTE) DR tablet 500 mg  500 mg Oral Q12H Charm Rings, NP   500 mg at 11/12/21 0816  ? FLUoxetine (PROZAC) capsule 20 mg  20 mg Oral Daily Charm Rings, NP   20 mg at 11/12/21 7262  ? ibuprofen (ADVIL) tablet 600 mg  600 mg Oral Q6H PRN Clapacs, John T, MD   600 mg at 11/10/21 0355  ? magnesium hydroxide (MILK OF MAGNESIA) suspension 30 mL  30 mL Oral Daily PRN Charm Rings, NP      ? nicotine (NICODERM CQ - dosed in mg/24 hours) patch 14 mg  14 mg Transdermal Daily Clapacs, Jackquline Denmark, MD   14 mg at 11/12/21 0820  ? QUEtiapine (SEROQUEL) tablet 50 mg  50 mg Oral BID Charm Rings, NP   50 mg at 11/12/21 9741  ? QUEtiapine (SEROQUEL) tablet 600 mg  600 mg Oral QHS Clapacs, Jackquline Denmark, MD   600 mg at 11/11/21 2109  ? ?PTA Medications: ?Medications Prior to Admission  ?Medication Sig Dispense Refill Last Dose  ? divalproex (DEPAKOTE) 500 MG DR tablet Take 1 tablet (500 mg total) by mouth every 12 (twelve) hours. (Patient not taking: Reported on 07/02/2021) 60 tablet 1   ? FLUoxetine (PROZAC) 20 MG capsule Take 1 capsule (20 mg total) by mouth daily. (Patient not taking: Reported on 07/02/2021) 30 capsule 1   ? QUEtiapine (SEROQUEL) 200 MG tablet  Take 1 tablet (200 mg total) by mouth at bedtime. (Patient not taking: Reported on 07/02/2021) 30 tablet 0   ? QUEtiapine (SEROQUEL) 50 MG tablet Take 1 tablet (50 mg total) by mouth 2 (two) times daily. (Patient not taking: Reported on 07/02/2021) 60 tablet 0   ? ? ?Patient Stressors: Medication change or noncompliance   ?Substance abuse   ? ?Patient Strengths: Ability for insight  ?Motivation for treatment/growth  ? ?Treatment Modalities: Medication Management, Group therapy, Case management,  ?1 to 1 session with clinician, Psychoeducation, Recreational therapy. ? ? ?Physician Treatment Plan for Primary Diagnosis: Schizoaffective disorder, bipolar type (HCC) ?Long Term Goal(s): Improvement in symptoms so as ready for discharge  ? ?Short Term Goals: Ability to identify changes in lifestyle to reduce recurrence of condition will improve ?Ability to verbalize feelings will improve ?Ability to disclose and discuss suicidal ideas ?Ability to demonstrate self-control will improve ?Ability to identify and develop effective coping behaviors will improve ?Ability to maintain clinical measurements within normal limits will improve ?Compliance with prescribed medications will improve ?Ability to identify triggers associated with substance abuse/mental health issues will improve ? ?Medication Management: Evaluate patient's response, side effects, and tolerance of medication regimen. ? ?Therapeutic Interventions: 1 to 1 sessions, Unit Group sessions and Medication administration. ? ?  Evaluation of Outcomes: Progressing ? ?Physician Treatment Plan for Secondary Diagnosis: Principal Problem: ?  Schizoaffective disorder, bipolar type (HCC) ? ?Long Term Goal(s): Improvement in symptoms so as ready for discharge  ? ?Short Term Goals: Ability to identify changes in lifestyle to reduce recurrence of condition will improve ?Ability to verbalize feelings will improve ?Ability to disclose and discuss suicidal ideas ?Ability to  demonstrate self-control will improve ?Ability to identify and develop effective coping behaviors will improve ?Ability to maintain clinical measurements within normal limits will improve ?Compliance with prescribed medications will improve ?Ability to identify triggers associated with substance abuse/mental health issues will improve    ? ?Medication Management: Evaluate patient's response, side effects, and tolerance of medication regimen. ? ?Therapeutic Interventions: 1 to 1 sessions, Unit Group sessions and Medication administration. ? ?Evaluation of Outcomes: Progressing ? ? ?RN Treatment Plan for Primary Diagnosis: Schizoaffective disorder, bipolar type (HCC) ?Long Term Goal(s): Knowledge of disease and therapeutic regimen to maintain health will improve ? ?Short Term Goals: Ability to remain free from injury will improve, Ability to verbalize frustration and anger appropriately will improve, Ability to demonstrate self-control, Ability to participate in decision making will improve, Ability to verbalize feelings will improve, Ability to disclose and discuss suicidal ideas, Ability to identify and develop effective coping behaviors will improve, and Compliance with prescribed medications will improve ? ?Medication Management: RN will administer medications as ordered by provider, will assess and evaluate patient's response and provide education to patient for prescribed medication. RN will report any adverse and/or side effects to prescribing provider. ? ?Therapeutic Interventions: 1 on 1 counseling sessions, Psychoeducation, Medication administration, Evaluate responses to treatment, Monitor vital signs and CBGs as ordered, Perform/monitor CIWA, COWS, AIMS and Fall Risk screenings as ordered, Perform wound care treatments as ordered. ? ?Evaluation of Outcomes: Progressing ? ? ?LCSW Treatment Plan for Primary Diagnosis: Schizoaffective disorder, bipolar type (HCC) ?Long Term Goal(s): Safe transition to  appropriate next level of care at discharge, Engage patient in therapeutic group addressing interpersonal concerns. ? ?Short Term Goals: Engage patient in aftercare planning with referrals and resources, Increase social support, Increase ability to appropriately verbalize feelings, Increase emotional regulation, Facilitate acceptance of mental health diagnosis and concerns, Facilitate patient progression through stages of change regarding substance use diagnoses and concerns, Identify triggers associated with mental health/substance abuse issues, and Increase skills for wellness and recovery ? ?Therapeutic Interventions: Assess for all discharge needs, 1 to 1 time with Child psychotherapist, Explore available resources and support systems, Assess for adequacy in community support network, Educate family and significant other(s) on suicide prevention, Complete Psychosocial Assessment, Interpersonal group therapy. ? ?Evaluation of Outcomes: Progressing ? ? ?Progress in Treatment: ?Attending groups: No. ?Participating in groups: No. ?Taking medication as prescribed: Yes. ?Toleration medication: Yes. ?Family/Significant other contact made: No, will contact:  Patient has declined for CSW to reach family/friend.  ?Patient understands diagnosis: Yes. ?Discussing patient identified problems/goals with staff: No. ?Medical problems stabilized or resolved: Yes. ?Denies suicidal/homicidal ideation: No. ?Issues/concerns per patient self-inventory: Yes. ?Other: none   ? ?New problem(s) identified: No, Describe:  No additional problems/concerns expressed at this time. Patient to start ECT treatment on 11/13/2021.  ? ?New Short Term/Long Term Goal(s): Patient to work towards elimination of symptoms of psychosis, medication management for mood stabilization; elimination of SI thoughts; development of comprehensive mental wellness plan. ? ?Patient Goals:  No additional goals identified at this time. Patient to continue to work towards  original goals identified in initial treatment team meeting. CSW  will remain available to patient should they voice additional treatment goals.  ? ?Discharge Plan or Barriers: Patient continues to lack stable housing. Pati

## 2021-11-12 NOTE — Plan of Care (Signed)
?  Problem: Education: ?Goal: Knowledge of [facility] Education information/materials will improve ?Outcome: Progressing ?Goal: Verbalization of understanding the information provided will improve ?Outcome: Progressing ?  ?Problem: Activity: ?Goal: Interest or engagement in activities will improve ?Outcome: Progressing ?  ?Problem: Health Behavior/Discharge Planning: ?Goal: Compliance with treatment plan for underlying cause of condition will improve ?Outcome: Progressing ?  ?Problem: Physical Regulation: ?Goal: Ability to maintain clinical measurements within normal limits will improve ?Outcome: Progressing ?  ?Problem: Safety: ?Goal: Periods of time without injury will increase ?Outcome: Progressing ?  ?Problem: Education: ?Goal: Knowledge of the prescribed therapeutic regimen will improve ?Outcome: Progressing ?  ?Problem: Role Relationship: ?Goal: Ability to communicate needs accurately will improve ?Outcome: Progressing ?  ?

## 2021-11-13 ENCOUNTER — Encounter: Payer: Self-pay | Admitting: Psychiatry

## 2021-11-13 ENCOUNTER — Inpatient Hospital Stay: Payer: 59 | Admitting: Certified Registered"

## 2021-11-13 ENCOUNTER — Ambulatory Visit: Payer: 59

## 2021-11-13 DIAGNOSIS — F251 Schizoaffective disorder, depressive type: Secondary | ICD-10-CM | POA: Insufficient documentation

## 2021-11-13 DIAGNOSIS — F250 Schizoaffective disorder, bipolar type: Secondary | ICD-10-CM | POA: Diagnosis not present

## 2021-11-13 LAB — GLUCOSE, CAPILLARY: Glucose-Capillary: 86 mg/dL (ref 70–99)

## 2021-11-13 MED ORDER — SUCCINYLCHOLINE CHLORIDE 200 MG/10ML IV SOSY
PREFILLED_SYRINGE | INTRAVENOUS | Status: DC | PRN
Start: 2021-11-13 — End: 2021-11-13
  Administered 2021-11-13: 200 mg via INTRAVENOUS

## 2021-11-13 MED ORDER — SODIUM CHLORIDE 0.9 % IV SOLN: 500.0000 mL | Freq: Once | INTRAVENOUS | Status: AC

## 2021-11-13 MED ORDER — ROCURONIUM BROMIDE 100 MG/10ML IV SOLN
INTRAVENOUS | Status: DC | PRN
  Administered 2021-11-13: 4 mg via INTRAVENOUS

## 2021-11-13 MED ORDER — SODIUM CHLORIDE 0.9 % IV SOLN: INTRAVENOUS | Status: DC | PRN

## 2021-11-13 MED ORDER — METHOHEXITAL SODIUM 100 MG/10ML IV SOSY
PREFILLED_SYRINGE | INTRAVENOUS | Status: DC | PRN
Start: 2021-11-13 — End: 2021-11-13
  Administered 2021-11-13: 100 mg via INTRAVENOUS

## 2021-11-13 MED ORDER — MIDAZOLAM HCL 2 MG/2ML IJ SOLN
INTRAMUSCULAR | Status: DC | PRN
  Administered 2021-11-13: 2 mg via INTRAVENOUS

## 2021-11-13 MED ORDER — MIDAZOLAM HCL 2 MG/2ML IJ SOLN
INTRAMUSCULAR | Status: AC
  Filled 2021-11-13: qty 2

## 2021-11-13 MED ORDER — SUCCINYLCHOLINE CHLORIDE 200 MG/10ML IV SOSY
PREFILLED_SYRINGE | INTRAVENOUS | Status: AC
  Filled 2021-11-13: qty 10

## 2021-11-13 MED ORDER — ONDANSETRON HCL 4 MG/2ML IJ SOLN: 4.0000 mg | Freq: Once | INTRAMUSCULAR | Status: DC | PRN

## 2021-11-13 MED ORDER — FENTANYL CITRATE (PF) 100 MCG/2ML IJ SOLN: 25.0000 ug | INTRAMUSCULAR | Status: DC | PRN

## 2021-11-13 NOTE — Progress Notes (Signed)
Patient affect better.  Watching the [employer] has him in a good mood.  He still endorses having depression. Looking forward to getting ECT tomorrow, hoping it will help him. He denies si  avh  anxiety is med compliant and received his meds without incident.   ? ? ? ?C Butler-Nicholson, LPN ?

## 2021-11-13 NOTE — Progress Notes (Signed)
D: Patient alert and oriented. Patient denies pain. Patient rates depression 8/10. Patient denies anxiety. Patient denies SI/HI/AVH. ? ?Patient started ECT treatment today. Patient tolerated ECT well. Patient states he is experiencing dizziness post ECT.   ? ?A: Scheduled medications administered to patient, per MD orders.  Support and encouragement provided to patient.  ?Q15 minute safety checks maintained.  ? ?R: Patient compliant with medication administration and treatment plan. No adverse drug reactions noted. Patient remains safe on the unit at this time.  ?

## 2021-11-13 NOTE — Procedures (Signed)
ECT SERVICES ?Physician?s Interval Evaluation & Treatment Note ? ?Patient Identification: Timothy [name] ?MRN:  [id number] ?Date of Evaluation:  11/13/2021 ?TX #: 1 ? ?MADRS:  ? ?MMSE:  ? ?P.E. Findings: ? ?No change to physical exam.  Normal. ? ?Psychiatric Interval Note: ? ?Depressed with auditory hallucinations and suicidal thoughts ? ?Subjective: ? ?Patient is a 26 y.o. male seen for evaluation for Electroconvulsive Therapy. ?Still feeling very depressed ? ?Treatment Summary: ? ? []   Right Unilateral             [x]  Bilateral ?  ?% Energy : 1.0 ms 20% ? ? Impedance: 1230 ohms ? ?Seizure Energy Index: 7940 ?V squared ? ?Postictal Suppression Index: 90% ? ?Seizure Concordance Index: 98% ? ?Medications ? ?Pre Shock: Brevital 100 mg succinylcholine 200 mg ? ?Post Shock: Versed 2 mg ? ?Seizure Duration: 49 seconds EMG 84 seconds EEG ? ? ?Comments: ?Despite the high dose of succinylcholine he still had a lot of movement so we will increase to 250 next time.  Also add 0.2 mg of Robinul ? ?Lungs: ? ?[x]   Clear to auscultation              ? ?[]  Other:  ? ?Heart: ?  ? [x]   Regular rhythm             []  irregular rhythm ? ? ? [x]   Previous H&P reviewed, patient examined and there are NO CHANGES              ?  ? []   Previous H&P reviewed, patient examined and there are changes noted. ? ? ? , MD ?5/12/20233:53 PM ? ?  ?

## 2021-11-13 NOTE — Transfer of Care (Signed)
Immediate Anesthesia Transfer of Care Note ? ?Patient: Timothy [name] ? ?Procedure(s) Performed: ECT TX ? ?Patient Location: PACU ? ?Anesthesia Type:General ? ?Level of Consciousness: drowsy ? ?Airway & Oxygen Therapy: Patient Spontanous Breathing and Patient connected to nasal cannula oxygen ? ?Post-op Assessment: Report given to RN and Post -op Vital signs reviewed and stable ? ?Post vital signs: Reviewed and stable ? ?Last Vitals:  ?Vitals Value Taken Time  ?BP 131/58 11/13/21 1405  ?Temp    ?Pulse 88 11/13/21 1405  ?Resp 26 11/13/21 1405  ?SpO2 91 % 11/13/21 1405  ? ? ?Last Pain:  ?Vitals:  ? 11/13/21 1307  ?TempSrc:   ?PainSc: 0-No pain  ?   ? ?Patients Stated Pain Goal: 0 (11/11/21 2155) ? ?Complications: No notable events documented. ?

## 2021-11-13 NOTE — Group Note (Signed)
[facility] LCSW Group Therapy Note ? ? ?Group Date: 11/13/2021 ?Start Time: 1300 ?End Time: 1400 ? ?Type of Therapy and Topic:  Group Therapy:  Feelings around Relapse and Recovery ? ?Participation Level:  Did Not Attend  ? ?Mood: ? ?Description of Group:   ? Patients in this group will discuss emotions they experience before and after a relapse. They will process how experiencing these feelings, or avoidance of experiencing them, relates to having a relapse. Facilitator will guide patients to explore emotions they have related to recovery. Patients will be encouraged to process which emotions are more powerful. They will be guided to discuss the emotional reaction significant others in their lives may have to patients? relapse or recovery. Patients will be assisted in exploring ways to respond to the emotions of others without this contributing to a relapse. ? ?Therapeutic Goals: ?Patient will identify two or more emotions that lead to relapse for them:  ?Patient will identify two emotions that result when they relapse:  ?Patient will identify two emotions related to recovery:  ?Patient will demonstrate ability to communicate their needs through discussion and/or role plays. ? ? ?Summary of Patient Progress: ? ?Patient did not attend group despite encouraged participation.  ? ? ?Therapeutic Modalities:   ?Cognitive Behavioral Therapy ?Solution-Focused Therapy ?Assertiveness Training ?Relapse Prevention Therapy ? ? ?Corky Crafts, LCSWA ?

## 2021-11-13 NOTE — Plan of Care (Signed)
?  Problem: Education: ?Goal: Knowledge of [facility] Education information/materials will improve ?Outcome: Progressing ?Goal: Emotional status will improve ?Outcome: Progressing ?Goal: Mental status will improve ?Description: Patient will became more alert no confusion to place and situation  ?Outcome: Progressing ?Goal: Verbalization of understanding the information provided will improve ?Outcome: Progressing ?  ?Problem: Activity: ?Goal: Interest or engagement in activities will improve ?Outcome: Progressing ?Goal: Sleeping patterns will improve ?Outcome: Progressing ?  ?Problem: Coping: ?Goal: Ability to verbalize frustrations and anger appropriately will improve ?Description: Patient will verbalize frustration and anger to the staff appropriately.  ?Outcome: Progressing ?Goal: Ability to demonstrate self-control will improve ?Outcome: Progressing ?  ?Problem: Health Behavior/Discharge Planning: ?Goal: Identification of resources available to assist in meeting health care needs will improve ?Outcome: Progressing ?Goal: Compliance with treatment plan for underlying cause of condition will improve ?Outcome: Progressing ?  ?Problem: Self-Concept: ?Goal: Will verbalize positive feelings about self ?Outcome: Progressing ?  ?Problem: Self-Care: ?Goal: Ability to participate in self-care as condition permits will improve ?Outcome: Progressing ?  ?Problem: Safety: ?Goal: Ability to redirect hostility and anger into socially appropriate behaviors will improve ?Outcome: Progressing ?Goal: Ability to remain free from injury will improve ?Outcome: Progressing ?  ?

## 2021-11-13 NOTE — Progress Notes (Signed)
[facility] MD Progress Note ? ?11/13/2021 3:36 PM ?Timothy [name]  ?MRN:  [id number] ?Subjective: Follow-up for this 26 year old man with severe depression as part of schizoaffective disorder.  Patient had his first ECT treatment today.  No complications at all.  Tolerated well.  Has been continuing to report depression with hallucinations and suicidal thought ?Principal Problem: Schizoaffective disorder, bipolar type (HCC) ?Diagnosis: Principal Problem: ?  Schizoaffective disorder, bipolar type (HCC) ? ?Total Time spent with patient: 30 minutes ? ?Past Psychiatric History: Past history of schizoaffective disorder with frequent depressive episodes ? ?Past Medical History:  ?Past Medical History:  ?Diagnosis Date  ? Cannabis abuse   ? Paranoid schizophrenia (HCC)   ? Schizoaffective disorder, depressive type (HCC) 09/17/2014  ? History reviewed. No pertinent surgical history. ?Family History:  ?Family History  ?Problem Relation Age of Onset  ? Mental illness Brother   ? Drug abuse Brother   ? Mental illness Cousin   ? Suicidality Cousin   ? Diabetes Maternal Grandmother   ? ?Family Psychiatric  History: See previous ?Social History:  ?Social History  ? ?Substance and Sexual Activity  ?Alcohol Use Yes  ? Alcohol/week: 2.0 standard drinks  ? Types: 2 Cans of beer per week  ?   ?Social History  ? ?Substance and Sexual Activity  ?Drug Use Yes  ? 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  ?Social History  ? ?Socioeconomic History  ? Marital status: Single  ?  Spouse name: Not on file  ? Number of children: Not on file  ? Years of education: Not on file  ? Highest education level: Not on file  ?Occupational History  ? Not on file  ?Tobacco Use  ? Smoking status: Some Days  ?  Packs/day: 0.25  ?  Types: Cigarettes  ? Smokeless tobacco: Never  ?Vaping Use  ? Vaping Use: Never used  ?Substance and Sexual Activity  ? Alcohol use: Yes  ?  Alcohol/week: 2.0 standard drinks  ?  Types: 2 Cans of beer per week  ? Drug use: Yes  ?  Types:  Marijuana, MDMA (Ecstacy)  ? Sexual activity: Yes  ?Other Topics Concern  ? Not on file  ?Social History Narrative  ? Not on file  ? ?Social Determinants of Health  ? ?Financial Resource Strain: Not on file  ?Food Insecurity: Not on file  ?Transportation Needs: Not on file  ?Physical Activity: Not on file  ?Stress: Not on file  ?Social Connections: Not on file  ? ?Additional Social History:  ?  ?  ?  ?  ?  ?  ?  ?  ?  ?  ?  ? ?Sleep: Fair ? ?Appetite:  Fair ? ?Current Medications: ?Current Facility-Administered Medications  ?Medication Dose Route Frequency Provider Last Rate Last Admin  ? 0.9 %  sodium chloride infusion  500 mL Intravenous Once Chandrika Sandles T, MD      ? acetaminophen (TYLENOL) tablet 650 mg  650 mg Oral Q6H PRN Charm Rings, NP   650 mg at 11/12/21 2127  ? alum & mag hydroxide-simeth (MAALOX/MYLANTA) 200-200-20 MG/5ML suspension 30 mL  30 mL Oral Q4H PRN Charm Rings, NP      ? buPROPion (WELLBUTRIN XL) 24 hr tablet 150 mg  150 mg Oral Daily Abdulloh Ullom, Jackquline Denmark, MD   150 mg at 11/13/21 0749  ? fentaNYL (SUBLIMAZE) injection 25 mcg  25 mcg Intravenous Q5 min PRN Darleene Cleaver, Gerrit Heck, MD      ? FLUoxetine (  PROZAC) capsule 20 mg  20 mg Oral Daily Charm Rings, NP   20 mg at 11/13/21 1308  ? ibuprofen (ADVIL) tablet 600 mg  600 mg Oral Q6H PRN Bijou Easler T, MD   600 mg at 11/10/21 6578  ? magnesium hydroxide (MILK OF MAGNESIA) suspension 30 mL  30 mL Oral Daily PRN Charm Rings, NP      ? nicotine (NICODERM CQ - dosed in mg/24 hours) patch 14 mg  14 mg Transdermal Daily Jessy Cybulski, Jackquline Denmark, MD   14 mg at 11/12/21 0820  ? ondansetron (ZOFRAN) injection 4 mg  4 mg Intravenous Once PRN Darleene Cleaver, Gerrit Heck, MD      ? QUEtiapine (SEROQUEL) tablet 50 mg  50 mg Oral BID Charm Rings, NP   50 mg at 11/13/21 0748  ? QUEtiapine (SEROQUEL) tablet 600 mg  600 mg Oral QHS Oluwatimilehin Balfour, Jackquline Denmark, MD   600 mg at 11/12/21 2125  ? ? ?Lab Results:  ?Results for orders placed or performed during the  hospital encounter of 11/01/21 (from the past 48 hour(s))  ?Glucose, capillary     Status: None  ? Collection Time: 11/13/21  7:33 AM  ?Result Value Ref Range  ? Glucose-Capillary 86 70 - 99 mg/dL  ?  Comment: Glucose reference range applies only to samples taken after fasting for at least 8 hours.  ? Comment 1 Notify RN   ? ? ?Blood Alcohol level:  ?Lab Results  ?Component Value Date  ? ETH <10 10/31/2021  ? ETH <10 06/30/2021  ? ? ?Metabolic Disorder Labs: ?Lab Results  ?Component Value Date  ? HGBA1C 5.5 11/04/2021  ? MPG 111.15 11/04/2021  ? MPG 114 06/17/2021  ? ?Lab Results  ?Component Value Date  ? PROLACTIN 33.2 (H) 08/15/2016  ? ?Lab Results  ?Component Value Date  ? CHOL 149 11/04/2021  ? TRIG 56 11/04/2021  ? HDL 35 (L) 11/04/2021  ? CHOLHDL 4.3 11/04/2021  ? VLDL 11 11/04/2021  ? LDLCALC 103 (H) 11/04/2021  ? LDLCALC 111 (H) 06/17/2021  ? ? ?Physical Findings: ?AIMS:  , ,  ,  ,    ?CIWA:    ?COWS:    ? ?Musculoskeletal: ?Strength & Muscle Tone: within normal limits ?Gait & Station: normal ?Patient leans: N/A ? ?Psychiatric Specialty Exam: ? ?Presentation  ?General Appearance: Appropriate for Environment ? ?Eye Contact:Fair ? ?Speech:Normal Rate ? ?Speech Volume:Normal ? ?Handedness:Right ? ? ?Mood and Affect  ?Mood:Euthymic ? ?Affect:Appropriate ? ? ?Thought Process  ?Thought Processes:Coherent ? ?Descriptions of Associations:Intact ? ?Orientation:Full (Time, Place and Person) ? ?Thought Content:Logical ? ?History of Schizophrenia/Schizoaffective disorder:Yes ? ?Duration of Psychotic Symptoms:Greater than six months ? ?Hallucinations:No data recorded ?Ideas of Reference:None ? ?Suicidal Thoughts:No data recorded ?Homicidal Thoughts:No data recorded ? ?Sensorium  ?Memory:Immediate Good; Recent Good; Remote Good ? ?Judgment:Fair ? ?Insight:Fair ? ? ?Executive Functions  ?Concentration:Good ? ?Attention Span:Good ? ?Recall:Fair ? ?Fund of Knowledge:Fair ? ?Language:Good ? ? ?Psychomotor Activity   ?Psychomotor Activity:No data recorded ? ?Assets  ?Assets:Communication Skills; Desire for Improvement; Financial Resources/Insurance; Resilience; Social Support ? ? ?Sleep  ?Sleep:No data recorded ? ? ?Physical Exam: ?Physical Exam ?Vitals and nursing note reviewed.  ?Constitutional:   ?   Appearance: Normal appearance.  ?HENT:  ?   Head: Normocephalic and atraumatic.  ?   Mouth/Throat:  ?   Pharynx: Oropharynx is clear.  ?Eyes:  ?   Pupils: Pupils are equal, round, and reactive to light.  ?Cardiovascular:  ?  Rate and Rhythm: Normal rate and regular rhythm.  ?Pulmonary:  ?   Effort: Pulmonary effort is normal.  ?   Breath sounds: Normal breath sounds.  ?Abdominal:  ?   General: Abdomen is flat.  ?   Palpations: Abdomen is soft.  ?Musculoskeletal:     ?   General: Normal range of motion.  ?Skin: ?   General: Skin is warm and dry.  ?Neurological:  ?   General: No focal deficit present.  ?   Mental Status: He is alert. Mental status is at baseline.  ?Psychiatric:     ?   Attention and Perception: Attention normal.     ?   Mood and Affect: Mood is depressed. Affect is blunt.     ?   Speech: Speech is delayed.     ?   Behavior: Behavior is slowed.     ?   Thought Content: Thought content includes suicidal ideation.  ? ?Review of Systems  ?Constitutional: Negative.   ?HENT: Negative.    ?Eyes: Negative.   ?Respiratory: Negative.    ?Cardiovascular: Negative.   ?Gastrointestinal: Negative.   ?Musculoskeletal: Negative.   ?Skin: Negative.   ?Neurological: Negative.   ?Psychiatric/Behavioral:  Positive for depression, hallucinations and suicidal ideas. Negative for substance abuse.   ?Blood pressure 118/77, pulse 76, temperature (!) 97 ?F (36.1 ?C), resp. rate 19, height 6\' 3"  (1.905 m), weight 93.4 kg, SpO2 95 %. Body mass index is 25.74 kg/m?. ? ? ?Treatment Plan Summary: ?Medication management and Plan continue current medication.  Continue ECT treatment.  I have stopped the Depakote for now and to facilitate ECT.   Next treatment Monday. ? ?Timothy RasmussenJohn Audriana Aldama, MD ?11/13/2021, 3:36 PM ? ?

## 2021-11-13 NOTE — H&P (Signed)
Timothy [name] is an 26 y.o. male.   ?Chief Complaint: Patient with schizoaffective disorder depressive type is depressed with suicidal thoughts and hallucinations but lucid and understanding and insightful and capable of giving consent to treatment ?HPI: Schizoaffective with recurrent depression ? ?Past Medical History:  ?Diagnosis Date  ? Cannabis abuse   ? Paranoid schizophrenia (HCC)   ? Schizoaffective disorder, depressive type (HCC) 09/17/2014  ? ? ?History reviewed. No pertinent surgical history. ? ?Family History  ?Problem Relation Age of Onset  ? Mental illness Brother   ? Drug abuse Brother   ? Mental illness Cousin   ? Suicidality Cousin   ? Diabetes Maternal Grandmother   ? ?Social History:  reports that he has been smoking cigarettes. He has been smoking an average of .25 packs per day. He has never used smokeless tobacco. He reports current alcohol use of about 2.0 standard drinks per week. He reports current drug use. Drugs: Marijuana and MDMA (Ecstacy). ? ?Allergies:  ?Allergies  ?Allergen Reactions  ? Haldol [Haloperidol Lactate] Other (See Comments)  ?  Muscle stiffness  ? Penicillins Hives  ?  Felt like throat was closing ?Has patient had a PCN reaction causing immediate rash, facial/tongue/throat swelling, SOB or lightheadedness with hypotension: Unknown ?Has patient had a PCN reaction causing severe rash involving mucus membranes or skin necrosis: unknown ?Has patient had a PCN reaction that required hospitalization Unknown ?Has patient had a PCN reaction occurring within the last 10 years: Unknown ?If all of the above answers are "NO", then may proceed with Cephalosporin use. ?  ? Zyprexa [Olanzapine]   ?  eps  ? ? ?Medications Prior to Admission  ?Medication Sig Dispense Refill  ? divalproex (DEPAKOTE) 500 MG DR tablet Take 1 tablet (500 mg total) by mouth every 12 (twelve) hours. 60 tablet 1  ? FLUoxetine (PROZAC) 20 MG capsule Take 1 capsule (20 mg total) by mouth daily. (Patient not taking:  Reported on 07/02/2021) 30 capsule 1  ? QUEtiapine (SEROQUEL) 200 MG tablet Take 1 tablet (200 mg total) by mouth at bedtime. (Patient not taking: Reported on 07/02/2021) 30 tablet 0  ? QUEtiapine (SEROQUEL) 50 MG tablet Take 1 tablet (50 mg total) by mouth 2 (two) times daily. (Patient not taking: Reported on 07/02/2021) 60 tablet 0  ? ? ?Results for orders placed or performed during the hospital encounter of 11/01/21 (from the past 48 hour(s))  ?Glucose, capillary     Status: None  ? Collection Time: 11/13/21  7:33 AM  ?Result Value Ref Range  ? Glucose-Capillary 86 70 - 99 mg/dL  ?  Comment: Glucose reference range applies only to samples taken after fasting for at least 8 hours.  ? Comment 1 Notify RN   ? ?No results found. ? ?Review of Systems  ?Constitutional: Negative.   ?HENT: Negative.    ?Eyes: Negative.   ?Respiratory: Negative.    ?Cardiovascular: Negative.   ?Gastrointestinal: Negative.   ?Musculoskeletal: Negative.   ?Skin: Negative.   ?Neurological: Negative.   ?Psychiatric/Behavioral:  Positive for dysphoric mood and hallucinations. Negative for agitation, behavioral problems and confusion.   ?All other systems reviewed and are negative. ? ?Blood pressure 118/77, pulse 76, temperature (!) 97 ?F (36.1 ?C), resp. rate 19, height 6\' 3"  (1.905 m), weight 93.4 kg, SpO2 95 %. ?Physical Exam ?Vitals and nursing note reviewed.  ?Constitutional:   ?   Appearance: He is well-developed.  ?HENT:  ?   Head: Normocephalic and atraumatic.  ?Eyes:  ?  Conjunctiva/sclera: Conjunctivae normal.  ?   Pupils: Pupils are equal, round, and reactive to light.  ?Cardiovascular:  ?   Heart sounds: Normal heart sounds.  ?Pulmonary:  ?   Effort: Pulmonary effort is normal.  ?Abdominal:  ?   Palpations: Abdomen is soft.  ?Musculoskeletal:     ?   General: Normal range of motion.  ?   Cervical back: Normal range of motion.  ?Skin: ?   General: Skin is warm and dry.  ?Neurological:  ?   General: No focal deficit present.  ?    Mental Status: He is alert.  ?Psychiatric:     ?   Attention and Perception: He is inattentive. He perceives auditory hallucinations.     ?   Mood and Affect: Mood is depressed.     ?   Thought Content: Thought content includes suicidal ideation. Thought content does not include suicidal plan.  ?  ? ?Assessment/Plan ?Plan is to begin bilateral ECT first treatment today next treatment Monday.  I discontinued his Depakote for the time being ? ?Mordecai Rasmussen, MD ?11/13/2021, 3:39 PM ? ? ? ?

## 2021-11-13 NOTE — Progress Notes (Signed)
Recreation Therapy Notes ? ?  ?Date: 11/13/2021 ?  ?Time: 10:40 am  ? ?Location: Craft room    ?  ?Behavioral response: Appropriate ?  ?Intervention Topic: Stress Management   ?  ?Discussion/Intervention:  ?Group content on today was focused on stress. The group defined stress and way to cope with stress. Participants expressed how they know when they are stresses out. Individuals described the different ways they have to cope with stress. The group stated reasons why it is important to cope with stress. Patient explained what good stress is and some examples. The group participated in the intervention ?Stress Management?. Individuals were separated into two group and answered questions related to stress.  ?Clinical Observations/Feedback: ?Patient came to group and identified his stress management techniques as therapy, music and meditating. Individual was social with peers and staff while participating in the intervention.    ?Shaneeka Scarboro LRT/CTRS  ? ? ? ? ? ? ? ? ?Navarre Diana ?11/13/2021 11:33 AM ?

## 2021-11-13 NOTE — Anesthesia Preprocedure Evaluation (Signed)
Anesthesia Evaluation  ?Patient identified by MRN, date of birth, ID band ?Patient awake ? ? ? ?Reviewed: ?Allergy & Precautions, NPO status , Patient's Chart, lab work & pertinent test results ? ?Airway ?Mallampati: II ? ?TM Distance: >3 FB ?Neck ROM: full ? ? ? Dental ? ?(+) Teeth Intact ?  ?Pulmonary ?neg pulmonary ROS, Current Smoker,  ?  ?Pulmonary exam normal ?breath sounds clear to auscultation ? ? ? ? ? ? Cardiovascular ?Exercise Tolerance: Good ?negative cardio ROS ?Normal cardiovascular exam ?Rhythm:Regular Rate:Normal ? ? ?  ?Neuro/Psych ?Depression Bipolar Disorder Schizophrenia negative neurological ROS ? negative psych ROS  ? GI/Hepatic ?negative GI ROS, Neg liver ROS,   ?Endo/Other  ?negative endocrine ROS ? Renal/GU ?negative Renal ROS  ?negative genitourinary ?  ?Musculoskeletal ? ? Abdominal ?Normal abdominal exam  (+)   ?Peds ?negative pediatric ROS ?(+)  Hematology ?negative hematology ROS ?(+)   ?Anesthesia Other Findings ?Past Medical History: ?No date: Cannabis abuse ?No date: Paranoid schizophrenia (HCC) ?09/17/2014: Schizoaffective disorder, depressive type (HCC) ? ?History reviewed. No pertinent surgical history. ? ?BMI   ? Body Mass Index: 25.74 kg/m?  ?  ? ? Reproductive/Obstetrics ?negative OB ROS ? ?  ? ? ? ? ? ? ? ? ? ? ? ? ? ?  ?  ? ? ? ? ? ? ? ? ?Anesthesia Physical ?Anesthesia Plan ? ?ASA: 2 ? ?Anesthesia Plan: General  ? ?Post-op Pain Management:   ? ?Induction: Intravenous ? ?PONV Risk Score and Plan: Propofol infusion and TIVA ? ?Airway Management Planned: Natural Airway ? ?Additional Equipment:  ? ?Intra-op Plan:  ? ?Post-operative Plan:  ? ?Informed Consent: I have reviewed the patients History and Physical, chart, labs and discussed the procedure including the risks, benefits and alternatives for the proposed anesthesia with the patient or authorized representative who has indicated his/her understanding and acceptance.  ? ? ? ?Dental Advisory  Given ? ?Plan Discussed with: CRNA and Surgeon ? ?Anesthesia Plan Comments:   ? ? ? ? ? ? ?Anesthesia Quick Evaluation ? ?

## 2021-11-13 NOTE — Plan of Care (Signed)
?  Problem: Education: ?Goal: Knowledge of [facility] Education information/materials will improve ?Outcome: Progressing ?Goal: Verbalization of understanding the information provided will improve ?Outcome: Progressing ?  ?Problem: Coping: ?Goal: Ability to verbalize frustrations and anger appropriately will improve ?Description: Patient will verbalize frustration and anger to the staff appropriately.  ?Outcome: Progressing ?  ?Problem: Health Behavior/Discharge Planning: ?Goal: Compliance with treatment plan for underlying cause of condition will improve ?Outcome: Progressing ?  ?Problem: Safety: ?Goal: Periods of time without injury will increase ?Outcome: Progressing ?  ?Problem: Education: ?Goal: Knowledge of the prescribed therapeutic regimen will improve ?Outcome: Progressing ?  ?Problem: Health Behavior/Discharge Planning: ?Goal: Compliance with prescribed medication regimen will improve ?Outcome: Progressing ?  ?Problem: Role Relationship: ?Goal: Ability to communicate needs accurately will improve ?Outcome: Progressing ?  ?Problem: Safety: ?Goal: Ability to redirect hostility and anger into socially appropriate behaviors will improve ?Outcome: Progressing ?  ?

## 2021-11-13 NOTE — Anesthesia Postprocedure Evaluation (Signed)
Anesthesia Post Note ? ?Patient: Timothy [name] ? ?Procedure(s) Performed: ECT TX ? ?Patient location during evaluation: PACU ?Anesthesia Type: General ?Level of consciousness: awake and oriented ?Pain management: satisfactory to patient ?Vital Signs Assessment: post-procedure vital signs reviewed and stable ?Respiratory status: spontaneous breathing and respiratory function stable ?Cardiovascular status: stable ?Anesthetic complications: no ? ? ?No notable events documented. ? ? ?Last Vitals:  ?Vitals:  ? 11/13/21 1407 11/13/21 1410  ?BP:  122/61  ?Pulse:  87  ?Resp:  (!) 21  ?Temp:    ?SpO2: 93% 94%  ?  ?Last Pain:  ?Vitals:  ? 11/13/21 1410  ?TempSrc:   ?PainSc: Asleep  ? ? ?  ?  ?  ?  ?  ?  ? ?VAN STAVEREN,Joevon Holliman ? ? ? ? ?

## 2021-11-14 NOTE — Group Note (Signed)
[facility] LCSW Group Therapy Note ? ? ?Group Date: 11/14/2021 ?Start Time: 1300 ?End Time: 1400 ? ? ?Type of Therapy/Topic:  Group Therapy:  Balance in Life ? ?Participation Level:  Active  ? ?Description of Group:   ? This group will address the concept of balance and how it feels and looks when one is unbalanced. Patients will be encouraged to process areas in their lives that are out of balance, and identify reasons for remaining unbalanced. Facilitators will guide patients utilizing problem- solving interventions to address and correct the stressor making their life unbalanced. Understanding and applying boundaries will be explored and addressed for obtaining  and maintaining a balanced life. Patients will be encouraged to explore ways to assertively make their unbalanced needs known to significant others in their lives, using other group members and facilitator for support and feedback. ? ?Therapeutic Goals: ?Patient will identify two or more emotions or situations they have that consume much of in their lives. ?Patient will identify signs/triggers that life has become out of balance:  ?Patient will identify two ways to set boundaries in order to achieve balance in their lives:  ?Patient will demonstrate ability to communicate their needs through discussion and/or role plays ? ?Summary of Patient Progress: Patient presented on time to group, participated in topic of discussion and provided feedback to other group members. Patient identified when he is experiencing depression, his life becomes off balance. Patient shared that there is "too much drama" in his family, sharing that he feels his family members have not been conducive to his well-being due to not helping him when he would ask. Patient expressed that he plans to not depend on his family anymore and moving forward, he plans to "go my own way." Patient expressed desire to go back to school and move out of state. Patient identified transportation as an ongoing  barrier.  ? ? ? ? ? ?Therapeutic Modalities:   ?Cognitive Behavioral Therapy ?Solution-Focused Therapy ?Assertiveness Training ? ? ?Ileana Ladd Wolfgang Finigan, LCSWA ?

## 2021-11-14 NOTE — Progress Notes (Signed)
During evening med pass, patient reported that he had a good day, he just wants to be able to get his depression under control. Patient also stated that he hopes ECT works. This Clinical research associate told patient that I have seen the changes in people from previous patients on the unit and that he should remain positive. Patient thanked Conservation officer, nature and then stated "you might see me again down here, I just need a computer". This Clinical research associate was thinking that patient was insinuating that he wanted to work on the unit, but patient did manage to crack a smile. ?

## 2021-11-14 NOTE — Progress Notes (Signed)
[facility] MD Progress Note ? ?11/14/2021 10:01 AM ?Timothy [name]  ?MRN:  [id number] ?Subjective:  Patient started ECT treatment today. Patient tolerated ECT well. Patient states he is experiencing dizziness post ECT.  Patient compliant with medication administration and treatment plan.  ? Patient remains calm, quiet and isolative on the floor and reports feeling depressed today, with no major improvement yet. He is sleeping and eating well and is " trying not to think about SI". He is denying any AVH.  ?  ?Principal Problem: Schizoaffective disorder, bipolar type (HCC) ?Diagnosis: Principal Problem: ?  Schizoaffective disorder, bipolar type (HCC) ? ?Total Time spent with patient: 20 minutes ? ?Past Psychiatric History: Schizoaffective D/O ? ?Past Medical History:  ?Past Medical History:  ?Diagnosis Date  ? Cannabis abuse   ? Paranoid schizophrenia (HCC)   ? Schizoaffective disorder, depressive type (HCC) 09/17/2014  ? History reviewed. No pertinent surgical history. ?Family History:  ?Family History  ?Problem Relation Age of Onset  ? Mental illness Brother   ? Drug abuse Brother   ? Mental illness Cousin   ? Suicidality Cousin   ? Diabetes Maternal Grandmother   ? ?Family Psychiatric  History:  ?Social History:  ?Social History  ? ?Substance and Sexual Activity  ?Alcohol Use Yes  ? Alcohol/week: 2.0 standard drinks  ? Types: 2 Cans of beer per week  ?   ?Social History  ? ?Substance and Sexual Activity  ?Drug Use Yes  ? 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  ?Social History  ? ?Socioeconomic History  ? Marital status: Single  ?  Spouse name: Not on file  ? Number of children: Not on file  ? Years of education: Not on file  ? Highest education level: Not on file  ?Occupational History  ? Not on file  ?Tobacco Use  ? Smoking status: Some Days  ?  Packs/day: 0.25  ?  Types: Cigarettes  ? Smokeless tobacco: Never  ?Vaping Use  ? Vaping Use: Never used  ?Substance and Sexual Activity  ? Alcohol use: Yes  ?  Alcohol/week: 2.0  standard drinks  ?  Types: 2 Cans of beer per week  ? Drug use: Yes  ?  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 Sexual activity: Yes  ?Other Topics Concern  ? Not on file  ?Social History Narrative  ? Not on file  ? ?Social Determinants of Health  ? ?Financial Resource Strain: Not on file  ?Food Insecurity: Not on file  ?Transportation Needs: Not on file  ?Physical Activity: Not on file  ?Stress: Not on file  ?Social Connections: Not on file  ? ?Additional Social History:  ?  ?  ?  ?  ?  ?  ?  ?  ?  ?  ?  ? ?Sleep: Good ? ?Appetite:  Fair ? ?Current Medications: ?Current Facility-Administered Medications  ?Medication Dose Route Frequency Provider Last Rate Last Admin  ? 0.9 %  sodium chloride infusion  500 mL Intravenous Once Clapacs, John T, MD      ? acetaminophen (TYLENOL) tablet 650 mg  650 mg Oral Q6H PRN Charm Rings, NP   650 mg at 11/14/21 0840  ? alum & mag hydroxide-simeth (MAALOX/MYLANTA) 200-200-20 MG/5ML suspension 30 mL  30 mL Oral Q4H PRN Charm Rings, NP      ? buPROPion (WELLBUTRIN XL) 24 hr tablet 150 mg  150 mg Oral Daily Clapacs, Jackquline Denmark, MD   150 mg at 11/14/21 2671  ? fentaNYL (SUBLIMAZE)  injection 25 mcg  25 mcg Intravenous Q5 min PRN Darleene Cleaver, Gijsbertus F, MD      ? FLUoxetine (PROZAC) capsule 20 mg  20 mg Oral Daily Charm Rings, NP   20 mg at 11/14/21 6294  ? ibuprofen (ADVIL) tablet 600 mg  600 mg Oral Q6H PRN Clapacs, Jackquline Denmark, MD   600 mg at 11/14/21 0840  ? magnesium hydroxide (MILK OF MAGNESIA) suspension 30 mL  30 mL Oral Daily PRN Charm Rings, NP      ? nicotine (NICODERM CQ - dosed in mg/24 hours) patch 14 mg  14 mg Transdermal Daily Clapacs, Jackquline Denmark, MD   14 mg at 11/14/21 7654  ? ondansetron (ZOFRAN) injection 4 mg  4 mg Intravenous Once PRN Darleene Cleaver, Gerrit Heck, MD      ? QUEtiapine (SEROQUEL) tablet 50 mg  50 mg Oral BID Charm Rings, NP   50 mg at 11/14/21 6503  ? QUEtiapine (SEROQUEL) tablet 600 mg  600 mg Oral QHS Clapacs, Jackquline Denmark, MD   600 mg at 11/13/21  2129  ? ? ?Lab Results:  ?Results for orders placed or performed during the hospital encounter of 11/01/21 (from the past 48 hour(s))  ?Glucose, capillary     Status: None  ? Collection Time: 11/13/21  7:33 AM  ?Result Value Ref Range  ? Glucose-Capillary 86 70 - 99 mg/dL  ?  Comment: Glucose reference range applies only to samples taken after fasting for at least 8 hours.  ? Comment 1 Notify RN   ? ? ?Blood Alcohol level:  ?Lab Results  ?Component Value Date  ? ETH <10 10/31/2021  ? ETH <10 06/30/2021  ? ? ?Metabolic Disorder Labs: ?Lab Results  ?Component Value Date  ? HGBA1C 5.5 11/04/2021  ? MPG 111.15 11/04/2021  ? MPG 114 06/17/2021  ? ?Lab Results  ?Component Value Date  ? PROLACTIN 33.2 (H) 08/15/2016  ? ?Lab Results  ?Component Value Date  ? CHOL 149 11/04/2021  ? TRIG 56 11/04/2021  ? HDL 35 (L) 11/04/2021  ? CHOLHDL 4.3 11/04/2021  ? VLDL 11 11/04/2021  ? LDLCALC 103 (H) 11/04/2021  ? LDLCALC 111 (H) 06/17/2021  ? ? ?Physical Findings: ?AIMS:  , ,  ,  ,    ?CIWA:    ?COWS:    ? ?Musculoskeletal: ?Strength & Muscle Tone: within normal limits ?Gait & Station: normal ?Patient leans: N/A ? ?Psychiatric Specialty Exam: ? ?Presentation  ?General Appearance: Appropriate for Environment ? ?Eye Contact:Fair ? ?Speech:Normal Rate ? ?Speech Volume:Normal ? ?Handedness:Right ? ? ?Mood and Affect  ?Mood:Euthymic ? ?Affect:Appropriate ? ? ?Thought Process  ?Thought Processes:Coherent ? ?Descriptions of Associations:Intact ? ?Orientation:Full (Time, Place and Person) ? ?Thought Content:Logical ? ?History of Schizophrenia/Schizoaffective disorder:Yes ? ?Duration of Psychotic Symptoms:Greater than six months ? ?Hallucinations:No data recorded ?Ideas of Reference:None ? ?Suicidal Thoughts:No data recorded ?Homicidal Thoughts:No data recorded ? ?Sensorium  ?Memory:Immediate Good; Recent Good; Remote Good ? ?Judgment:Fair ? ?Insight:Fair ? ? ?Executive Functions  ?Concentration:Good ? ?Attention  Span:Good ? ?Recall:Fair ? ?Fund of Knowledge:Fair ? ?Language:Good ? ? ?Psychomotor Activity  ?Psychomotor Activity:No data recorded ? ?Assets  ?Assets:Communication Skills; Desire for Improvement; Financial Resources/Insurance; Resilience; Social Support ? ? ?Sleep  ?Sleep:No data recorded ? ? ?Physical Exam: ?Physical Exam ?Constitutional:   ?   Appearance: Normal appearance. He is normal weight.  ?HENT:  ?   Head: Normocephalic and atraumatic.  ?   Right Ear: Tympanic membrane normal.  ?  Left Ear: Tympanic membrane normal.  ?   Nose: Nose normal.  ?   Mouth/Throat:  ?   Mouth: Mucous membranes are moist.  ?   Pharynx: Oropharynx is clear.  ?Eyes:  ?   Extraocular Movements: Extraocular movements intact.  ?   Conjunctiva/sclera: Conjunctivae normal.  ?   Pupils: Pupils are equal, round, and reactive to light.  ?Cardiovascular:  ?   Rate and Rhythm: Normal rate and regular rhythm.  ?   Pulses: Normal pulses.  ?   Heart sounds: Normal heart sounds.  ?Pulmonary:  ?   Effort: Pulmonary effort is normal.  ?   Breath sounds: Normal breath sounds.  ?Abdominal:  ?   General: Abdomen is flat. Bowel sounds are normal.  ?   Palpations: Abdomen is soft.  ?Musculoskeletal:     ?   General: Normal range of motion.  ?   Cervical back: Normal range of motion and neck supple.  ?Skin: ?   General: Skin is warm and dry.  ?Neurological:  ?   General: No focal deficit present.  ?   Mental Status: He is alert and oriented to person, place, and time. Mental status is at baseline.  ? ?Review of Systems  ?Constitutional: Negative.   ?HENT: Negative.    ?Eyes: Negative.   ?Respiratory: Negative.    ?Cardiovascular: Negative.   ?Gastrointestinal: Negative.   ?Genitourinary: Negative.   ?Musculoskeletal: Negative.   ?Skin: Negative.   ?Neurological: Negative.   ?Endo/Heme/Allergies: Negative.   ?Psychiatric/Behavioral:  Positive for depression and suicidal ideas.   ?Blood pressure 115/68, pulse 84, temperature 97.9 ?F (36.6 ?C), resp.  rate 19, height  (1.905 m), weight 93.4 kg, SpO2 100 %. Body mass index is 25.74 kg/m?. ? ? ?Treatment Plan Summary: ?Daily contact with patient to assess and evaluate symptoms and progress in treatment, Medication management, and Pla

## 2021-11-14 NOTE — Plan of Care (Signed)
D- Patient alert and oriented. Patient presented in a pleasant mood on assessment stating that he slept good last night and had complaints of leg pain. Patient rated his pain level a "7/10", in which he did request PRN pain medication from this Clinical research associate. Patient reported SI on his self-inventory, he stated that he has no plan, however, he does contract for safety with this Clinical research associate. Patient continues to endorse both depression and anxiety, rating them a "10/10", and "6/10". Patient stated that "I don't have nowhere to go" and "my family", is why he feels this way. Patient denied HI/AVH at this time. Patient's stated goal for today is to "try not to think too much". Per his self-inventory, his goal for today is "trying to figure out where I am going after my discharge. Also trying to figure out if I have children", in which he will "try to concentrate on better things to think about". Patient also reported on his self-inventory that "I wish I could move away with someone, take me into their home and I start over from beginning". ? ?A- Scheduled medications administered to patient, per MD orders. Support and encouragement provided.  Routine safety checks conducted every 15 minutes.  Patient informed to notify staff with problems or concerns. ? ?R- No adverse drug reactions noted. Patient contracts for safety at this time. Patient compliant with medications and treatment plan. Patient receptive, calm, and cooperative. Patient interacts well with others on the unit.  Patient remains safe at this time. ? ?Problem: Education: ?Goal: Knowledge of [facility] Education information/materials will improve ?Outcome: Not Progressing ?Goal: Emotional status will improve ?Outcome: Not Progressing ?Goal: Mental status will improve ?Description: Patient will became more alert no confusion to place and situation  ?Outcome: Not Progressing ?Goal: Verbalization of understanding the information provided will improve ?Outcome: Not  Progressing ?  ?Problem: Activity: ?Goal: Interest or engagement in activities will improve ?Outcome: Not Progressing ?Goal: Sleeping patterns will improve ?Outcome: Not Progressing ?  ?Problem: Coping: ?Goal: Ability to verbalize frustrations and anger appropriately will improve ?Description: Patient will verbalize frustration and anger to the staff appropriately.  ?Outcome: Not Progressing ?Goal: Ability to demonstrate self-control will improve ?Outcome: Not Progressing ?  ?Problem: Health Behavior/Discharge Planning: ?Goal: Identification of resources available to assist in meeting health care needs will improve ?Outcome: Not Progressing ?Goal: Compliance with treatment plan for underlying cause of condition will improve ?Outcome: Not Progressing ?  ?Problem: Physical Regulation: ?Goal: Ability to maintain clinical measurements within normal limits will improve ?Outcome: Not Progressing ?  ?Problem: Safety: ?Goal: Periods of time without injury will increase ?Outcome: Not Progressing ?  ?Problem: Activity: ?Goal: Will verbalize the importance of balancing activity with adequate rest periods ?Outcome: Not Progressing ?  ?Problem: Education: ?Goal: Will be free of psychotic symptoms ?Description: Patient will be free of psychosis  ?Outcome: Not Progressing ?Goal: Knowledge of the prescribed therapeutic regimen will improve ?Outcome: Not Progressing ?  ?Problem: Coping: ?Goal: Coping ability will improve ?Outcome: Not Progressing ?Goal: Will verbalize feelings ?Outcome: Not Progressing ?  ?Problem: Health Behavior/Discharge Planning: ?Goal: Compliance with prescribed medication regimen will improve ?Outcome: Not Progressing ?  ?Problem: Nutritional: ?Goal: Ability to achieve adequate nutritional intake will improve ?Outcome: Not Progressing ?  ?Problem: Role Relationship: ?Goal: Ability to communicate needs accurately will improve ?Outcome: Not Progressing ?Goal: Ability to interact with others will  improve ?Outcome: Not Progressing ?  ?Problem: Safety: ?Goal: Ability to redirect hostility and anger into socially appropriate behaviors will improve ?Outcome:  Not Progressing ?Goal: Ability to remain free from injury will improve ?Outcome: Not Progressing ?  ?Problem: Self-Care: ?Goal: Ability to participate in self-care as condition permits will improve ?Outcome: Not Progressing ?  ?Problem: Self-Concept: ?Goal: Will verbalize positive feelings about self ?Outcome: Not Progressing ?  ?

## 2021-11-15 NOTE — Progress Notes (Signed)
D: Patient alert and oriented. Patient denies pain. Patient denies anxiety. Patient rates depression 7/10.  Patient denies SI/HI/AVH.  ?Patient did state that he had a good day today.  ?Patient isolative to room with the exception to coming out for meals. ? ?A: Scheduled medications administered to patient, per MD orders.  Support and encouragement provided to patient.  ?Q15 minute safety checks maintained.  ? ?R: Patient compliant with medication administration and treatment plan. No adverse drug reactions noted. Patient remains safe on the unit at this time.  ?

## 2021-11-15 NOTE — Group Note (Signed)
LCSW Group Therapy Note ? ?Group Date: 11/15/2021 ?Start Time: [id number] ?End Time: [id number] ? ? ?Type of Therapy and Topic:  Group Therapy - Healthy vs Unhealthy Coping Skills ? ?Participation Level:  Did Not Attend  ? ?Description of Group ?The focus of this group was to determine what unhealthy coping techniques typically are used by group members and what healthy coping techniques would be helpful in coping with various problems. Patients were guided in becoming aware of the differences between healthy and unhealthy coping techniques. Patients were asked to identify 2-3 healthy coping skills they would like to learn to use more effectively. ? ?Therapeutic Goals ?Patients learned that coping is what human beings do all day long to deal with various situations in their lives ?Patients defined and discussed healthy vs unhealthy coping techniques ?Patients identified their preferred coping techniques and identified whether these were healthy or unhealthy ?Patients determined 2-3 healthy coping skills they would like to become more familiar with and use more often. ?Patients provided support and ideas to each other ? ? ?Summary of Patient Progress: Due to influx of patients on the units, group was not held.  ? ? ?Therapeutic Modalities ?Cognitive Behavioral Therapy ?Motivational Interviewing ? ?Kenna Gilbert Adalyn Pennock, LCSWA ?11/15/2021  2:55 PM   ?

## 2021-11-15 NOTE — Progress Notes (Signed)
[facility] ? ?11/15/2021 8:59 AM ?Timothy [name]  ?MRN:  [id number] ?Subjective:  During evening med pass, patient reported that he had a good day, he just wants to be able to get his depression under control. Patient also stated that he hopes ECT works. This Clinical research associatewriter told patient that I have seen the changes in people from previous patients on the unit and that he should remain positive. Patient thanked Conservation officer, naturethis writer and then stated "you might see me again down here, I just need a computer". This Clinical research associatewriter was thinking that patient was insinuating that he wanted to work on the unit, but patient did manage to crack a smile. ? Patient reports feeling slightly better today with some improvement in his depression. He is usually isolative on the floor, coming out for meals and meds. He slept well last night and is denying current SI.  ?Principal Problem: Schizoaffective disorder, bipolar type (HCC) ?Diagnosis: Principal Problem: ?  Schizoaffective disorder, bipolar type (HCC) ? ?Total Time spent with patient: 20 minutes ? ?Past Psychiatric History: Schizoaffective D/O ? ?Past Medical History:  ?Past Medical History:  ?Diagnosis Date  ? Cannabis abuse   ? Paranoid schizophrenia (HCC)   ? Schizoaffective disorder, depressive type (HCC) 09/17/2014  ? History reviewed. No pertinent surgical history. ?Family History:  ?Family History  ?Problem Relation Age of Onset  ? Mental illness Brother   ? Drug abuse Brother   ? Mental illness Cousin   ? Suicidality Cousin   ? Diabetes Maternal Grandmother   ? ?Family Psychiatric  History:  ?Social History:  ?Social History  ? ?Substance and Sexual Activity  ?Alcohol Use Yes  ? Alcohol/week: 2.0 standard drinks  ? Types: 2 Cans of beer per week  ?   ?Social History  ? ?Substance and Sexual Activity  ?Drug Use Yes  ? 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  ?Social History  ? ?Socioeconomic History  ? Marital status: Single  ?  Spouse name: Not on file  ? Number of children: Not on file  ?  Years of education: Not on file  ? Highest education level: Not on file  ?Occupational History  ? Not on file  ?Tobacco Use  ? Smoking status: Some Days  ?  Packs/day: 0.25  ?  Types: Cigarettes  ? Smokeless tobacco: Never  ?Vaping Use  ? Vaping Use: Never used  ?Substance and Sexual Activity  ? Alcohol use: Yes  ?  Alcohol/week: 2.0 standard drinks  ?  Types: 2 Cans of beer per week  ? Drug use: Yes  ?  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 Sexual activity: Yes  ?Other Topics Concern  ? Not on file  ?Social History Narrative  ? Not on file  ? ?Social Determinants of Health  ? ?Financial Resource Strain: Not on file  ?Food Insecurity: Not on file  ?Transportation Needs: Not on file  ?Physical Activity: Not on file  ?Stress: Not on file  ?Social Connections: Not on file  ? ?Additional Social History:  ?  ?  ?  ?  ?  ?  ?  ?  ?  ?  ?  ? ?Sleep: Fair ? ?Appetite:  Fair ? ?Current Medications: ?Current Facility-Administered Medications  ?Medication Dose Route Frequency Provider Last Rate Last Admin  ? 0.9 %  sodium chloride infusion  500 mL Intravenous Once Clapacs, John T, MD      ? acetaminophen (TYLENOL) tablet 650 mg  650 mg Oral Q6H PRN Lord,  Herminio Heads, NP   650 mg at 11/14/21 0840  ? alum & mag hydroxide-simeth (MAALOX/MYLANTA) 200-200-20 MG/5ML suspension 30 mL  30 mL Oral Q4H PRN Charm Rings, NP      ? buPROPion (WELLBUTRIN XL) 24 hr tablet 150 mg  150 mg Oral Daily Clapacs, Jackquline Denmark, MD   150 mg at 11/15/21 0830  ? fentaNYL (SUBLIMAZE) injection 25 mcg  25 mcg Intravenous Q5 min PRN Darleene Cleaver, Gijsbertus F, MD      ? FLUoxetine (PROZAC) capsule 20 mg  20 mg Oral Daily Charm Rings, NP   20 mg at 11/15/21 0830  ? ibuprofen (ADVIL) tablet 600 mg  600 mg Oral Q6H PRN Clapacs, Jackquline Denmark, MD   600 mg at 11/14/21 0840  ? magnesium hydroxide (MILK OF MAGNESIA) suspension 30 mL  30 mL Oral Daily PRN Charm Rings, NP      ? nicotine (NICODERM CQ - dosed in mg/24 hours) patch 14 mg  14 mg Transdermal Daily Clapacs,  Jackquline Denmark, MD   14 mg at 11/15/21 1829  ? ondansetron (ZOFRAN) injection 4 mg  4 mg Intravenous Once PRN Darleene Cleaver, Gerrit Heck, MD      ? QUEtiapine (SEROQUEL) tablet 50 mg  50 mg Oral BID Charm Rings, NP   50 mg at 11/15/21 0830  ? QUEtiapine (SEROQUEL) tablet 600 mg  600 mg Oral QHS Clapacs, Jackquline Denmark, MD   600 mg at 11/14/21 2118  ? ? ?Lab Results: No results found for this or any previous visit (from the past 48 hour(s)). ? ?Blood Alcohol level:  ?Lab Results  ?Component Value Date  ? ETH <10 10/31/2021  ? ETH <10 06/30/2021  ? ? ?Metabolic Disorder Labs: ?Lab Results  ?Component Value Date  ? HGBA1C 5.5 11/04/2021  ? MPG 111.15 11/04/2021  ? MPG 114 06/17/2021  ? ?Lab Results  ?Component Value Date  ? PROLACTIN 33.2 (H) 08/15/2016  ? ?Lab Results  ?Component Value Date  ? CHOL 149 11/04/2021  ? TRIG 56 11/04/2021  ? HDL 35 (L) 11/04/2021  ? CHOLHDL 4.3 11/04/2021  ? VLDL 11 11/04/2021  ? LDLCALC 103 (H) 11/04/2021  ? LDLCALC 111 (H) 06/17/2021  ? ? ?Physical Findings: ?AIMS:  , ,  ,  ,    ?CIWA:    ?COWS:    ? ?Musculoskeletal: ?Strength & Muscle Tone: within normal limits ?Gait & Station: normal ?Patient leans: N/A ? ?Psychiatric Specialty Exam: ? ?Presentation  ?General Appearance: Disheveled ? ?Eye Contact:Fleeting ? ?Speech:Slow ? ?Speech Volume:Decreased ? ?Handedness:Right ? ? ?Mood and Affect  ?Mood:Depressed ? ?Affect:Flat ? ? ?Thought Process  ?Thought Processes:Linear ? ?Descriptions of Associations:Circumstantial ? ?Orientation:Full (Time, Place and Person) ? ?Thought Content:Scattered ? ?History of Schizophrenia/Schizoaffective disorder:Yes ? ?Duration of Psychotic Symptoms:Greater than six months ? ?Hallucinations:Hallucinations: Auditory ? ?Ideas of Reference:Paranoia ? ?Suicidal Thoughts:Suicidal Thoughts: Yes, Passive ?SI Passive Intent and/or Plan: Without Plan ? ?Homicidal Thoughts:Homicidal Thoughts: No ? ? ?Sensorium  ?Memory:Immediate Fair ? ?Judgment:Fair ? ?Insight:Fair ? ? ?Executive  Functions  ?Concentration:Fair ? ?Attention Span:Fair ? ?Recall:Fair ? ?Fund of Knowledge:Fair ? ?Language:Fair ? ? ?Psychomotor Activity  ?Psychomotor Activity:Psychomotor Activity: Decreased ? ? ?Assets  ?Assets:Communication Skills ? ? ?Sleep  ?Sleep:Sleep: Fair ? ? ? ?Physical Exam: ?Physical Exam ?Constitutional:   ?   Appearance: Normal appearance. He is normal weight.  ?HENT:  ?   Head: Normocephalic and atraumatic.  ?   Right Ear: Tympanic membrane normal.  ?  Left Ear: Tympanic membrane normal.  ?   Nose: Nose normal.  ?   Mouth/Throat:  ?   Mouth: Mucous membranes are moist.  ?   Pharynx: Oropharynx is clear.  ?Eyes:  ?   Extraocular Movements: Extraocular movements intact.  ?   Conjunctiva/sclera: Conjunctivae normal.  ?   Pupils: Pupils are equal, round, and reactive to light.  ?Cardiovascular:  ?   Rate and Rhythm: Normal rate and regular rhythm.  ?   Pulses: Normal pulses.  ?   Heart sounds: Normal heart sounds.  ?Pulmonary:  ?   Effort: Pulmonary effort is normal.  ?   Breath sounds: Normal breath sounds.  ?Abdominal:  ?   General: Abdomen is flat. Bowel sounds are normal.  ?   Palpations: Abdomen is soft.  ?Musculoskeletal:     ?   General: Normal range of motion.  ?   Cervical back: Normal range of motion and neck supple.  ?Skin: ?   General: Skin is warm and dry.  ?Neurological:  ?   General: No focal deficit present.  ?   Mental Status: He is alert.  ? ?Review of Systems  ?Constitutional: Negative.   ?HENT: Negative.    ?Eyes: Negative.   ?Respiratory: Negative.    ?Cardiovascular: Negative.   ?Gastrointestinal: Negative.   ?Genitourinary: Negative.   ?Musculoskeletal: Negative.   ?Skin: Negative.   ?Neurological: Negative.   ?Endo/Heme/Allergies: Negative.   ?Psychiatric/Behavioral:  Positive for depression and hallucinations. The patient is nervous/anxious.   ?Blood pressure 120/69, pulse 72, temperature 98.4 ?F (36.9 ?C), temperature source Oral, resp. rate 18, height 6\' 3"  (1.905 m), weight  93.4 kg, SpO2 100 %. Body mass index is 25.74 kg/m?. ? ? ?Treatment Plan Summary: ?Daily contact with patient to assess and evaluate symptoms and progress in treatment, Medication management, and Plan

## 2021-11-15 NOTE — Progress Notes (Signed)
Patient is seen in his bedroom at the beginning of the shift. He is AAOx4. He denies pain, anxiety, depression, SI, HI, and AVH. He is medication compliant. He is safe on the unit at this time. Q 15 minute safety checks in place.  ?

## 2021-11-15 NOTE — Plan of Care (Signed)
?  Problem: Education: ?Goal: Knowledge of [facility] Education information/materials will improve ?Outcome: Progressing ?Goal: Verbalization of understanding the information provided will improve ?Outcome: Progressing ?  ?Problem: Health Behavior/Discharge Planning: ?Goal: Compliance with treatment plan for underlying cause of condition will improve ?Outcome: Progressing ?  ?Problem: Physical Regulation: ?Goal: Ability to maintain clinical measurements within normal limits will improve ?Outcome: Progressing ?  ?Problem: Safety: ?Goal: Periods of time without injury will increase ?Outcome: Progressing ?  ?Problem: Education: ?Goal: Knowledge of the prescribed therapeutic regimen will improve ?Outcome: Progressing ?  ?Problem: Health Behavior/Discharge Planning: ?Goal: Compliance with prescribed medication regimen will improve ?Outcome: Progressing ?  ?Problem: Nutritional: ?Goal: Ability to achieve adequate nutritional intake will improve ?Outcome: Progressing ?  ?

## 2021-11-16 ENCOUNTER — Inpatient Hospital Stay: Payer: 59 | Admitting: Certified Registered Nurse Anesthetist

## 2021-11-16 ENCOUNTER — Ambulatory Visit (HOSPITAL_BASED_OUTPATIENT_CLINIC_OR_DEPARTMENT_OTHER)
Admit: 2021-11-16 | Discharge: 2021-11-16 | Disposition: A | Discharge status: Home / Self Care | Payer: 59 | Attending: Psychiatry | Admitting: Psychiatry

## 2021-11-16 DIAGNOSIS — F17210 Nicotine dependence, cigarettes, uncomplicated: Secondary | ICD-10-CM | POA: Insufficient documentation

## 2021-11-16 DIAGNOSIS — Z79899 Other long term (current) drug therapy: Secondary | ICD-10-CM | POA: Insufficient documentation

## 2021-11-16 DIAGNOSIS — F251 Schizoaffective disorder, depressive type: Secondary | ICD-10-CM | POA: Diagnosis not present

## 2021-11-16 DIAGNOSIS — F323 Major depressive disorder, single episode, severe with psychotic features: Secondary | ICD-10-CM | POA: Insufficient documentation

## 2021-11-16 DIAGNOSIS — F1210 Cannabis abuse, uncomplicated: Secondary | ICD-10-CM | POA: Insufficient documentation

## 2021-11-16 DIAGNOSIS — F250 Schizoaffective disorder, bipolar type: Secondary | ICD-10-CM | POA: Diagnosis not present

## 2021-11-16 LAB — GLUCOSE, CAPILLARY
Glucose-Capillary: 95 mg/dL (ref 70–99)
Glucose-Capillary: 113 mg/dL — ABNORMAL HIGH (ref 70–99)

## 2021-11-16 MED ORDER — MIDAZOLAM HCL 2 MG/2ML IJ SOLN
INTRAMUSCULAR | Status: AC
  Filled 2021-11-16: qty 2

## 2021-11-16 MED ORDER — METHOHEXITAL SODIUM 100 MG/10ML IV SOSY
PREFILLED_SYRINGE | INTRAVENOUS | Status: DC | PRN
  Administered 2021-11-16: 100 mg via INTRAVENOUS

## 2021-11-16 MED ORDER — SUCCINYLCHOLINE CHLORIDE 200 MG/10ML IV SOSY
PREFILLED_SYRINGE | INTRAVENOUS | Status: DC | PRN
Start: 2021-11-16 — End: 2021-11-16
  Administered 2021-11-16: 250 mg via INTRAVENOUS

## 2021-11-16 MED ORDER — MIDAZOLAM HCL 2 MG/2ML IJ SOLN
INTRAMUSCULAR | Status: DC | PRN
  Administered 2021-11-16: 4 mg via INTRAVENOUS

## 2021-11-16 NOTE — H&P (Signed)
Timothy [name] is an 26 y.o. male.   ?Chief Complaint: Continues to feel dysphoric with some improvement.  No active suicidal plan but still somewhat hopeless.  Tolerating medicine well. ?HPI: History of recurrent psychotic depression ? ?Past Medical History:  ?Diagnosis Date  ? Cannabis abuse   ? Paranoid schizophrenia (Timothy [name])   ? Schizoaffective disorder, depressive type (Timothy [name]) 09/17/2014  ? ? ?History reviewed. No pertinent surgical history. ? ?Family History  ?Problem Relation Age of Onset  ? Mental illness Brother   ? Drug abuse Brother   ? Mental illness Cousin   ? Suicidality Cousin   ? Diabetes Maternal Grandmother   ? ?Social History:  reports that he has been smoking cigarettes. He has been smoking an average of .25 packs per day. He has never used smokeless tobacco. He reports current alcohol use of about 2.0 standard drinks per week. He reports current drug use. Drugs: Marijuana and MDMA (Ecstacy). ? ?Allergies:  ?Allergies  ?Allergen Reactions  ? Haldol [Haloperidol Lactate] Other (See Comments)  ?  Muscle stiffness  ? Penicillins Hives  ?  Felt like throat was closing ?Has patient had a PCN reaction causing immediate rash, facial/tongue/throat swelling, SOB or lightheadedness with hypotension: Unknown ?Has patient had a PCN reaction causing severe rash involving mucus membranes or skin necrosis: unknown ?Has patient had a PCN reaction that required hospitalization Unknown ?Has patient had a PCN reaction occurring within the last 10 years: Unknown ?If all of the above answers are "NO", then may proceed with Cephalosporin use. ?  ? Zyprexa [Olanzapine]   ?  eps  ? ? ?(Not in a hospital admission) ? ? ?Results for orders placed or performed during the hospital encounter of 11/01/21 (from the past 48 hour(s))  ?Glucose, capillary     Status: None  ? Collection Time: 11/16/21  8:06 AM  ?Result Value Ref Range  ? Glucose-Capillary 95 70 - 99 mg/dL  ?  Comment: Glucose reference range applies only to samples  taken after fasting for at least 8 hours.  ? ?No results found. ? ?Review of Systems  ?Constitutional: Negative.   ?HENT: Negative.    ?Eyes: Negative.   ?Respiratory: Negative.    ?Cardiovascular: Negative.   ?Gastrointestinal: Negative.   ?Musculoskeletal: Negative.   ?Skin: Negative.   ?Neurological: Negative.   ?Psychiatric/Behavioral:  Positive for dysphoric mood and hallucinations.   ?All other systems reviewed and are negative. ? ?Blood pressure 112/69, pulse 63, temperature (!) 97 ?F (36.1 ?C), resp. rate 14, height 6\' 3"  (1.905 m), weight 93.4 kg, SpO2 100 %. ?Physical Exam ?Vitals and nursing note reviewed.  ?Constitutional:   ?   Appearance: He is well-developed.  ?HENT:  ?   Head: Normocephalic and atraumatic.  ?Eyes:  ?   Conjunctiva/sclera: Conjunctivae normal.  ?   Pupils: Pupils are equal, round, and reactive to light.  ?Cardiovascular:  ?   Heart sounds: Normal heart sounds.  ?Pulmonary:  ?   Effort: Pulmonary effort is normal.  ?Abdominal:  ?   Palpations: Abdomen is soft.  ?Musculoskeletal:     ?   General: Normal range of motion.  ?   Cervical back: Normal range of motion.  ?Skin: ?   General: Skin is warm and dry.  ?Neurological:  ?   General: No focal deficit present.  ?   Mental Status: He is alert.  ?Psychiatric:     ?   Mood and Affect: Mood is depressed.     ?  Thought Content: Thought content includes suicidal ideation. Thought content does not include suicidal plan.  ?  ? ?Assessment/Plan ?Second ECT treatment today while continuing medicine ? ?Alethia Berthold, MD ?11/16/2021, 5:23 PM ? ? ? ?

## 2021-11-16 NOTE — Plan of Care (Signed)
?  Problem: Education: ?Goal: Knowledge of [facility] Education information/materials will improve ?Outcome: Progressing ?Goal: Emotional status will improve ?Outcome: Progressing ?Goal: Mental status will improve ?Description: Patient will became more alert no confusion to place and situation  ?Outcome: Progressing ?Goal: Verbalization of understanding the information provided will improve ?Outcome: Progressing ?  ?Problem: Activity: ?Goal: Interest or engagement in activities will improve ?Outcome: Progressing ?Goal: Sleeping patterns will improve ?Outcome: Progressing ?  ?Problem: Coping: ?Goal: Ability to verbalize frustrations and anger appropriately will improve ?Description: Patient will verbalize frustration and anger to the staff appropriately.  ?Outcome: Progressing ?Goal: Ability to demonstrate self-control will improve ?Outcome: Progressing ?  ?Problem: Health Behavior/Discharge Planning: ?Goal: Identification of resources available to assist in meeting health care needs will improve ?Outcome: Progressing ?Goal: Compliance with treatment plan for underlying cause of condition will improve ?Outcome: Progressing ?  ?Problem: Physical Regulation: ?Goal: Ability to maintain clinical measurements within normal limits will improve ?Outcome: Progressing ?  ?Problem: Safety: ?Goal: Periods of time without injury will increase ?Outcome: Progressing ?  ?Problem: Activity: ?Goal: Will verbalize the importance of balancing activity with adequate rest periods ?Outcome: Progressing ?  ?Problem: Education: ?Goal: Will be free of psychotic symptoms ?Description: Patient will be free of psychosis  ?Outcome: Progressing ?Goal: Knowledge of the prescribed therapeutic regimen will improve ?Outcome: Progressing ?  ?Problem: Coping: ?Goal: Coping ability will improve ?Outcome: Progressing ?Goal: Will verbalize feelings ?Outcome: Progressing ?  ?Problem: Health Behavior/Discharge Planning: ?Goal: Compliance with  prescribed medication regimen will improve ?Outcome: Progressing ?  ?Problem: Nutritional: ?Goal: Ability to achieve adequate nutritional intake will improve ?Outcome: Progressing ?  ?Problem: Role Relationship: ?Goal: Ability to communicate needs accurately will improve ?Outcome: Progressing ?Goal: Ability to interact with others will improve ?Outcome: Progressing ?  ?Problem: Safety: ?Goal: Ability to redirect hostility and anger into socially appropriate behaviors will improve ?Outcome: Progressing ?Goal: Ability to remain free from injury will improve ?Outcome: Progressing ?  ?Problem: Self-Care: ?Goal: Ability to participate in self-care as condition permits will improve ?Outcome: Progressing ?  ?Problem: Self-Concept: ?Goal: Will verbalize positive feelings about self ?Outcome: Progressing ?  ?

## 2021-11-16 NOTE — Progress Notes (Signed)
Pt isolating to room and post ECT on day shift. Pt denies SI/HI and AVH. Anxiety  score 0 and depression score 7. Pt stated he still felt a little lightheaded for ECT, but his gait was steady and VS WNL. Pt did not want to talk, just to rest. He got a late snack, due to being NPO this morning. ?

## 2021-11-16 NOTE — Anesthesia Preprocedure Evaluation (Signed)
Anesthesia Evaluation  ?Patient identified by MRN, date of birth, ID band ?Patient awake ? ? ? ?Reviewed: ?Allergy & Precautions, NPO status , Patient's Chart, lab work & pertinent test results ? ?History of Anesthesia Complications ?Negative for: history of anesthetic complications ? ?Airway ?Mallampati: II ? ?TM Distance: >3 FB ?Neck ROM: full ? ? ? Dental ? ?(+) Teeth Intact, Dental Advidsory Given ?  ?Pulmonary ?neg shortness of breath, neg COPD, neg recent URI, Current Smoker,  ?  ?Pulmonary exam normal ?breath sounds clear to auscultation ? ? ? ? ? ? Cardiovascular ?Exercise Tolerance: Good ?negative cardio ROS ?Normal cardiovascular exam ?Rhythm:Regular Rate:Normal ? ? ?  ?Neuro/Psych ?PSYCHIATRIC DISORDERS Depression Bipolar Disorder Schizophrenia negative neurological ROS ?   ? GI/Hepatic ?negative GI ROS, Neg liver ROS,   ?Endo/Other  ?negative endocrine ROS ? Renal/GU ?negative Renal ROS  ?negative genitourinary ?  ?Musculoskeletal ? ? Abdominal ?Normal abdominal exam  (+)   ?Peds ?negative pediatric ROS ?(+)  Hematology ?negative hematology ROS ?(+)   ?Anesthesia Other Findings ?Past Medical History: ?No date: Cannabis abuse ?No date: Paranoid schizophrenia (HCC) ?09/17/2014: Schizoaffective disorder, depressive type (HCC) ? ?History reviewed. No pertinent surgical history. ? ?BMI   ? Body Mass Index: 25.74 kg/m?  ?  ? ? Reproductive/Obstetrics ?negative OB ROS ? ?  ? ? ? ? ? ? ? ? ? ? ? ? ? ?  ?  ? ? ? ? ? ? ? ? ?Anesthesia Physical ? ?Anesthesia Plan ? ?ASA: 2 ? ?Anesthesia Plan: General  ? ?Post-op Pain Management:   ? ?Induction: Intravenous ? ?PONV Risk Score and Plan: Propofol infusion and TIVA ? ?Airway Management Planned: Natural Airway ? ?Additional Equipment:  ? ?Intra-op Plan:  ? ?Post-operative Plan:  ? ?Informed Consent: I have reviewed the patients History and Physical, chart, labs and discussed the procedure including the risks, benefits and alternatives  for the proposed anesthesia with the patient or authorized representative who has indicated his/her understanding and acceptance.  ? ? ? ?Dental Advisory Given ? ?Plan Discussed with: CRNA and Surgeon ? ?Anesthesia Plan Comments:   ? ? ? ? ? ? ?Anesthesia Quick Evaluation ? ?

## 2021-11-16 NOTE — Progress Notes (Signed)
Recreation Therapy Notes ? ?Date: 11/16/2021 ? ?Time: 10:50 am   ? ?Location: Courtyard   ? ?Behavioral response: N/A ?  ?Intervention Topic: Leisure   ? ?Discussion/Intervention: ?Patient unable to attend group.  ? ?Clinical Observations/Feedback:  ?Patient unable to attend group.  ?  ?Kavi Almquist LRT/CTRS ? ? ? ? ? ? ? ?[place] ?11/16/2021 1:03 PM ?

## 2021-11-16 NOTE — Anesthesia Procedure Notes (Signed)
Procedure Name: General with mask airway ?Date/Time: 11/16/2021 12:15 PM ?Performed by: Joanette Gula, Kenslei Hearty, CRNA ?Pre-anesthesia Checklist: Patient identified, Suction available, Patient being monitored, Timeout performed and Emergency Drugs available ?Patient Re-evaluated:Patient Re-evaluated prior to induction ?Oxygen Delivery Method: Simple face mask ?Induction Type: IV induction ?Ventilation: Mask ventilation throughout procedure ? ? ? ? ?

## 2021-11-16 NOTE — Transfer of Care (Signed)
Immediate Anesthesia Transfer of Care Note ? ?Patient: Timothy [name] ? ?Procedure(s) Performed: ECT TX ? ?Patient Location: PACU ? ?Anesthesia Type:General ? ?Level of Consciousness: drowsy ? ?Airway & Oxygen Therapy: Patient Spontanous Breathing and Patient connected to face mask oxygen ? ?Post-op Assessment: Report given to RN and Post -op Vital signs reviewed and stable ? ?Post vital signs: Reviewed and stable ? ?Last Vitals:  ?Vitals Value Taken Time  ?BP 130/70 11/16/21 1212  ?Temp    ?Pulse 87 11/16/21 1214  ?Resp 19 11/16/21 1214  ?SpO2 100 % 11/16/21 1214  ?Vitals shown include unvalidated device data. ? ?Last Pain:  ?Vitals:  ? 11/16/21 1116  ?TempSrc:   ?PainSc: 0-No pain  ?   ? ?  ? ?Complications: No notable events documented. ?

## 2021-11-16 NOTE — Plan of Care (Signed)
?  Problem: Education: ?Goal: Knowledge of [facility] Education information/materials will improve ?Outcome: Progressing ?Goal: Verbalization of understanding the information provided will improve ?Outcome: Progressing ?  ?Problem: Health Behavior/Discharge Planning: ?Goal: Compliance with treatment plan for underlying cause of condition will improve ?Outcome: Progressing ?  ?Problem: Safety: ?Goal: Periods of time without injury will increase ?Outcome: Progressing ?  ?Problem: Education: ?Goal: Will be free of psychotic symptoms ?Description: Patient will be free of psychosis  ?Outcome: Progressing ?Goal: Knowledge of the prescribed therapeutic regimen will improve ?Outcome: Progressing ?  ?

## 2021-11-16 NOTE — Progress Notes (Signed)
D: Patient alert and oriented. Patient denies pain. Patient denies depression. Patient rates anxiety 7/10. Patient denies SI/HI/AVH. ?Patient had ECT treatment during today shift. Patient tolerated ECT well. Patient compliant of light headedness post ECT.  ? ?A: Scheduled medications administered to patient, per MD orders.  Support and encouragement provided to patient.  ?Q15 minute safety checks maintained.  ? ?R: Patient compliant with medication administration and treatment plan. No adverse drug reactions noted. Patient remains safe on the unit at this time.  ?

## 2021-11-16 NOTE — Procedures (Signed)
ECT SERVICES ?Physician?s Interval Evaluation & Treatment Note ? ?Patient Identification: [name] ?MRN:  [id number] ?Date of Evaluation:  11/16/2021 ?[place] #: 2 ? ?MADRS:  ? ?MMSE:  ? ?P.E. Findings: ? ?No change to physical ? ?Psychiatric Interval Note: ? ?Interacting a little better brighter look to him ? ?Subjective: ? ?Patient is a 26 y.o. male seen for evaluation for Electroconvulsive Therapy. ?Some improvement in depression ? ?Treatment Summary: ? ? []   Right Unilateral             [x]  Bilateral ?  ?% Energy : 1.0 ms 20% ? ? Impedance: 1120 ohms ? ?Seizure Energy Index: 12,408 ?V squared ? ?Postictal Suppression Index: 70% ? ?Seizure Concordance Index: 98% ? ?Medications ? ?Pre Shock: Brevital 100 mg succinylcholine 250 mg ? ?Post Shock: Versed 4 mg ? ?Seizure Duration: EMG 46 seconds EEG 127 seconds ? ? ?Comments: ?Follow-up Wednesday ? ?Lungs: ? ?[x]   Clear to auscultation              ? ?[]  Other:  ? ?Heart: ?  ? [x]   Regular rhythm             []  irregular rhythm ? ? ? [x]   Previous H&P reviewed, patient examined and there are NO CHANGES              ?  ? []   Previous H&P reviewed, patient examined and there are changes noted. ? ? ?Alethia Berthold, MD ?5/15/20235:30 PM ? ?  ?

## 2021-11-16 NOTE — Group Note (Signed)
[facility] LCSW Group Therapy Note ? ? ? ?Group Date: 11/16/2021 ?Start Time: 1300 ?End Time: 1400 ? ?Type of Therapy and Topic:  Group Therapy:  Overcoming Obstacles ? ?Participation Level:  [facility] PARTICIPATION LEVEL: Did Not Attend ? ?Mood: ? ?Description of Group:   ?In this group patients will be encouraged to explore what they see as obstacles to their own wellness and recovery. They will be guided to discuss their thoughts, feelings, and behaviors related to these obstacles. The group will process together ways to cope with barriers, with attention given to specific choices patients can make. Each patient will be challenged to identify changes they are motivated to make in order to overcome their obstacles. This group will be process-oriented, with patients participating in exploration of their own experiences as well as giving and receiving support and challenge from other group members. ? ?Therapeutic Goals: ?1. Patient will identify personal and current obstacles as they relate to admission. ?2. Patient will identify barriers that currently interfere with their wellness or overcoming obstacles.  ?3. Patient will identify feelings, thought process and behaviors related to these barriers. ?4. Patient will identify two changes they are willing to make to overcome these obstacles:  ? ? ?Summary of Patient Progress ? ? ?Patient did not attend group despite encouraged participation.  ? ? ?Therapeutic Modalities:   ?Cognitive Behavioral Therapy ?Solution Focused Therapy ?Motivational Interviewing ?Relapse Prevention Therapy ? ? ?Rhylen Shaheen W  Jon, LCSWA ?

## 2021-11-16 NOTE — Progress Notes (Signed)
[facility] MD Progress Note ? ?11/16/2021 4:39 PM ?Timothy [name]  ?MRN:  [id number] ?Subjective: Follow-up for this patient with schizoaffective disorder with depression.  Seems to be slightly better this weekend.  Still feeling down and isolative.  Tolerating treatment well ?Principal Problem: Schizoaffective disorder, bipolar type (HCC) ?Diagnosis: Principal Problem: ?  Schizoaffective disorder, bipolar type (HCC) ? ?Total Time spent with patient: 30 minutes ? ?Past Psychiatric History: Past history of depressive psychosis ? ?Past Medical History:  ?Past Medical History:  ?Diagnosis Date  ? Cannabis abuse   ? Paranoid schizophrenia (HCC)   ? Schizoaffective disorder, depressive type (HCC) 09/17/2014  ? History reviewed. No pertinent surgical history. ?Family History:  ?Family History  ?Problem Relation Age of Onset  ? Mental illness Brother   ? Drug abuse Brother   ? Mental illness Cousin   ? Suicidality Cousin   ? Diabetes Maternal Grandmother   ? ?Family Psychiatric  History: See previous ?Social History:  ?Social History  ? ?Substance and Sexual Activity  ?Alcohol Use Yes  ? Alcohol/week: 2.0 standard drinks  ? Types: 2 Cans of beer per week  ?   ?Social History  ? ?Substance and Sexual Activity  ?Drug Use Yes  ? 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  ?Social History  ? ?Socioeconomic History  ? Marital status: Single  ?  Spouse name: Not on file  ? Number of children: Not on file  ? Years of education: Not on file  ? Highest education level: Not on file  ?Occupational History  ? Not on file  ?Tobacco Use  ? Smoking status: Some Days  ?  Packs/day: 0.25  ?  Types: Cigarettes  ? Smokeless tobacco: Never  ?Vaping Use  ? Vaping Use: Never used  ?Substance and Sexual Activity  ? Alcohol use: Yes  ?  Alcohol/week: 2.0 standard drinks  ?  Types: 2 Cans of beer per week  ? Drug use: Yes  ?  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 Sexual activity: Yes  ?Other Topics Concern  ? Not on file  ?Social History Narrative  ? Not on file   ? ?Social Determinants of Health  ? ?Financial Resource Strain: Not on file  ?Food Insecurity: Not on file  ?Transportation Needs: Not on file  ?Physical Activity: Not on file  ?Stress: Not on file  ?Social Connections: Not on file  ? ?Additional Social History:  ?  ?  ?  ?  ?  ?  ?  ?  ?  ?  ?  ? ?Sleep: Fair ? ?Appetite:  Fair ? ?Current Medications: ?Current Facility-Administered Medications  ?Medication Dose Route Frequency Provider Last Rate Last Admin  ? acetaminophen (TYLENOL) tablet 650 mg  650 mg Oral Q6H PRN Charm Rings, NP   650 mg at 11/14/21 0840  ? alum & mag hydroxide-simeth (MAALOX/MYLANTA) 200-200-20 MG/5ML suspension 30 mL  30 mL Oral Q4H PRN Charm Rings, NP      ? buPROPion (WELLBUTRIN XL) 24 hr tablet 150 mg  150 mg Oral Daily Sereena Marando, Jackquline Denmark, MD   150 mg at 11/16/21 0801  ? fentaNYL (SUBLIMAZE) injection 25 mcg  25 mcg Intravenous Q5 min PRN Darleene Cleaver, Gijsbertus F, MD      ? FLUoxetine (PROZAC) capsule 20 mg  20 mg Oral Daily Charm Rings, NP   20 mg at 11/16/21 0801  ? ibuprofen (ADVIL) tablet 600 mg  600 mg Oral Q6H PRN Zakeya Junker, Jackquline Denmark, MD  600 mg at 11/14/21 0840  ? magnesium hydroxide (MILK OF MAGNESIA) suspension 30 mL  30 mL Oral Daily PRN Charm Rings, NP      ? nicotine (NICODERM CQ - dosed in mg/24 hours) patch 14 mg  14 mg Transdermal Daily Haja Crego, Jackquline Denmark, MD   14 mg at 11/15/21 1194  ? ondansetron (ZOFRAN) injection 4 mg  4 mg Intravenous Once PRN Darleene Cleaver, Gerrit Heck, MD      ? QUEtiapine (SEROQUEL) tablet 50 mg  50 mg Oral BID Charm Rings, NP   50 mg at 11/16/21 0801  ? QUEtiapine (SEROQUEL) tablet 600 mg  600 mg Oral QHS Flannery Cavallero, Jackquline Denmark, MD   600 mg at 11/15/21 2114  ? ? ?Lab Results:  ?Results for orders placed or performed during the hospital encounter of 11/01/21 (from the past 48 hour(s))  ?Glucose, capillary     Status: None  ? Collection Time: 11/16/21  8:06 AM  ?Result Value Ref Range  ? Glucose-Capillary 95 70 - 99 mg/dL  ?  Comment: Glucose  reference range applies only to samples taken after fasting for at least 8 hours.  ? ? ?Blood Alcohol level:  ?Lab Results  ?Component Value Date  ? ETH <10 10/31/2021  ? ETH <10 06/30/2021  ? ? ?Metabolic Disorder Labs: ?Lab Results  ?Component Value Date  ? HGBA1C 5.5 11/04/2021  ? MPG 111.15 11/04/2021  ? MPG 114 06/17/2021  ? ?Lab Results  ?Component Value Date  ? PROLACTIN 33.2 (H) 08/15/2016  ? ?Lab Results  ?Component Value Date  ? CHOL 149 11/04/2021  ? TRIG 56 11/04/2021  ? HDL 35 (L) 11/04/2021  ? CHOLHDL 4.3 11/04/2021  ? VLDL 11 11/04/2021  ? LDLCALC 103 (H) 11/04/2021  ? LDLCALC 111 (H) 06/17/2021  ? ? ?Physical Findings: ?AIMS:  , ,  ,  ,    ?CIWA:    ?COWS:    ? ?Musculoskeletal: ?Strength & Muscle Tone: within normal limits ?Gait & Station: normal ?Patient leans: N/A ? ?Psychiatric Specialty Exam: ? ?Presentation  ?General Appearance: Casual; Neat ? ?Eye Contact:Fair ? ?Speech:Slow ? ?Speech Volume:Decreased ? ?Handedness:Right ? ? ?Mood and Affect  ?Mood:Anxious; Depressed ? ?Affect:Constricted ? ? ?Thought Process  ?Thought Processes:Linear ? ?Descriptions of Associations:Circumstantial ? ?Orientation:Full (Time, Place and Person) ? ?Thought Content:Logical ? ?History of Schizophrenia/Schizoaffective disorder:Yes ? ?Duration of Psychotic Symptoms:Greater than six months ? ?Hallucinations:Hallucinations: Auditory ? ?Ideas of Reference:None ? ?Suicidal Thoughts:Suicidal Thoughts: No ? ?Homicidal Thoughts:Homicidal Thoughts: No ? ? ?Sensorium  ?Memory:Immediate Fair; Remote Fair ? ?Judgment:Fair ? ?Insight:Fair ? ? ?Executive Functions  ?Concentration:Fair ? ?Attention Span:Fair ? ?Recall:Fair ? ?Fund of Knowledge:Fair ? ?Language:Fair ? ? ?Psychomotor Activity  ?Psychomotor Activity:Psychomotor Activity: Decreased ? ? ?Assets  ?Assets:Communication Skills; Desire for Improvement; Physical Health ? ? ?Sleep  ?Sleep:Sleep: Fair ? ? ? ?Physical Exam: ?Physical Exam ?Vitals and nursing note reviewed.   ?Constitutional:   ?   Appearance: Normal appearance.  ?HENT:  ?   Head: Normocephalic and atraumatic.  ?   Mouth/Throat:  ?   Pharynx: Oropharynx is clear.  ?Eyes:  ?   Pupils: Pupils are equal, round, and reactive to light.  ?Cardiovascular:  ?   Rate and Rhythm: Normal rate and regular rhythm.  ?Pulmonary:  ?   Effort: Pulmonary effort is normal.  ?   Breath sounds: Normal breath sounds.  ?Abdominal:  ?   General: Abdomen is flat.  ?   Palpations: Abdomen is soft.  ?Musculoskeletal:     ?  General: Normal range of motion.  ?Skin: ?   General: Skin is warm and dry.  ?Neurological:  ?   General: No focal deficit present.  ?   Mental Status: He is alert. Mental status is at baseline.  ?Psychiatric:     ?   Attention and Perception: Attention normal. He perceives auditory hallucinations.     ?   Mood and Affect: Mood is depressed.     ?   Speech: Speech is delayed.     ?   Behavior: Behavior is slowed.     ?   Thought Content: Thought content normal.     ?   Cognition and Memory: Cognition normal.     ?   Judgment: Judgment normal.  ? ?Review of Systems  ?Constitutional: Negative.   ?HENT: Negative.    ?Eyes: Negative.   ?Respiratory: Negative.    ?Cardiovascular: Negative.   ?Gastrointestinal: Negative.   ?Musculoskeletal: Negative.   ?Skin: Negative.   ?Neurological: Negative.   ?Psychiatric/Behavioral:  Positive for depression and hallucinations. Negative for memory loss, substance abuse and suicidal ideas. The patient is not nervous/anxious and does not have insomnia.   ?Blood pressure 101/62, pulse 74, temperature 98.4 ?F (36.9 ?C), temperature source Oral, resp. rate 18, height 6\' 3"  (1.905 m), weight 93.4 kg, SpO2 100 %. Body mass index is 25.74 kg/m?. ? ? ?Treatment Plan Summary: ?Medication management and Plan second ECT treatment today without complication.  Continue ECT course and medication ? ?Timothy RasmussenJohn Leomar Westberg, MD ?11/16/2021, 4:39 PM ? ?

## 2021-11-17 DIAGNOSIS — F250 Schizoaffective disorder, bipolar type: Secondary | ICD-10-CM | POA: Diagnosis not present

## 2021-11-17 NOTE — Progress Notes (Signed)
[facility] MD Progress Note ? 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  Has had 2 ECT.  No new complaints.  Still somewhat down and dysphoric and having headaches.  Denies active suicidal thoughts.  Still a little confused and withdrawn ?Principal Problem: Schizoaffective disorder, bipolar type (HCC) ?Diagnosis: Principal Problem: ?  Schizoaffective disorder, bipolar type (HCC) ? ?Total Time spent with patient: 30 minutes ? ?Past Psychiatric History: Past history of recurrent depression with psychosis as part of schizoaffective disorder ? ?Past Medical History:  ?Past Medical History:  ?Diagnosis Date  ? Cannabis abuse   ? Paranoid schizophrenia (HCC)   ? Schizoaffective disorder, depressive type (HCC) 09/17/2014  ? History reviewed. No pertinent surgical history. ?Family History:  ?Family History  ?Problem Relation Age of Onset  ? Mental illness Brother   ? Drug abuse Brother   ? Mental illness Cousin   ? Suicidality Cousin   ? Diabetes Maternal Grandmother   ? ?Family Psychiatric  History: See previous ?Social History:  ?Social History  ? ?Substance and Sexual Activity  ?Alcohol Use Yes  ? Alcohol/week: 2.0 standard drinks  ? Types: 2 Cans of beer per week  ?   ?Social History  ? ?Substance and Sexual Activity  ?Drug Use Yes  ? 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  ?Social History  ? ?Socioeconomic History  ? Marital status: Single  ?  Spouse name: Not on file  ? Number of children: Not on file  ? Years of education: Not on file  ? Highest education level: Not on file  ?Occupational History  ? Not on file  ?Tobacco Use  ? Smoking status: Some Days  ?  Packs/day: 0.25  ?  Types: Cigarettes  ? Smokeless tobacco: Never  ?Vaping Use  ? Vaping Use: Never used  ?Substance and Sexual Activity  ? Alcohol use: Yes  ?  Alcohol/week: 2.0 standard drinks  ?  Types: 2 Cans of beer per week  ? Drug use: Yes  ?  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 Sexual  activity: Yes  ?Other Topics Concern  ? Not on file  ?Social History Narrative  ? Not on file  ? ?Social Determinants of Health  ? ?Financial Resource Strain: Not on file  ?Food Insecurity: Not on file  ?Transportation Needs: Not on file  ?Physical Activity: Not on file  ?Stress: Not on file  ?Social Connections: Not on file  ? ?Additional Social History:  ?  ?  ?  ?  ?  ?  ?  ?  ?  ?  ?  ? ?Sleep: Fair ? ?Appetite:  Fair ? ?Current Medications: ?Current Facility-Administered Medications  ?Medication Dose Route Frequency Provider Last Rate Last Admin  ? acetaminophen (TYLENOL) tablet 650 mg  650 mg Oral Q6H PRN Charm Rings, NP   650 mg at 11/14/21 0840  ? alum & mag hydroxide-simeth (MAALOX/MYLANTA) 200-200-20 MG/5ML suspension 30 mL  30 mL Oral Q4H PRN Charm Rings, NP      ? buPROPion (WELLBUTRIN XL) 24 hr tablet 150 mg  150 mg Oral Daily Kaison Mcparland, Jackquline Denmark, MD   150 mg at 11/17/21 0825  ? fentaNYL (SUBLIMAZE) injection 25 mcg  25 mcg Intravenous Q5 min PRN Darleene Cleaver, Gijsbertus F, MD      ? FLUoxetine (PROZAC) capsule 20 mg  20 mg Oral Daily Charm Rings, NP   20 mg at 11/17/21 7062  ?  ibuprofen (ADVIL) tablet 600 mg  600 mg Oral Q6H PRN Antanette Richwine, Jackquline Denmark, MD   600 mg at 11/14/21 0840  ? magnesium hydroxide (MILK OF MAGNESIA) suspension 30 mL  30 mL Oral Daily PRN Charm Rings, NP      ? nicotine (NICODERM CQ - dosed in mg/24 hours) patch 14 mg  14 mg Transdermal Daily Surina Storts, Jackquline Denmark, MD   14 mg at 11/17/21 4098  ? ondansetron (ZOFRAN) injection 4 mg  4 mg Intravenous Once PRN Darleene Cleaver, Gerrit Heck, MD      ? QUEtiapine (SEROQUEL) tablet 50 mg  50 mg Oral BID Charm Rings, NP   50 mg at 11/17/21 1191  ? QUEtiapine (SEROQUEL) tablet 600 mg  600 mg Oral QHS Khristi Schiller, Jackquline Denmark, MD   600 mg at 11/16/21 2106  ? ? ?Lab Results:  ?Results for orders placed or performed during the hospital encounter of 11/01/21 (from the past 48 hour(s))  ?Glucose, capillary     Status: None  ? Collection Time: 11/16/21   8:06 AM  ?Result Value Ref Range  ? Glucose-Capillary 95 70 - 99 mg/dL  ?  Comment: Glucose reference range applies only to samples taken after fasting for at least 8 hours.  ? ? ?Blood Alcohol level:  ?Lab Results  ?Component Value Date  ? ETH <10 10/31/2021  ? ETH <10 06/30/2021  ? ? ?Metabolic Disorder Labs: ?Lab Results  ?Component Value Date  ? HGBA1C 5.5 11/04/2021  ? MPG 111.15 11/04/2021  ? MPG 114 06/17/2021  ? ?Lab Results  ?Component Value Date  ? PROLACTIN 33.2 (H) 08/15/2016  ? ?Lab Results  ?Component Value Date  ? CHOL 149 11/04/2021  ? TRIG 56 11/04/2021  ? HDL 35 (L) 11/04/2021  ? CHOLHDL 4.3 11/04/2021  ? VLDL 11 11/04/2021  ? LDLCALC 103 (H) 11/04/2021  ? LDLCALC 111 (H) 06/17/2021  ? ? ?Physical Findings: ?AIMS:  , ,  ,  ,    ?CIWA:    ?COWS:    ? ?Musculoskeletal: ?Strength & Muscle Tone: within normal limits ?Gait & Station: normal ?Patient leans: N/A ? ?Psychiatric Specialty Exam: ? ?Presentation  ?General Appearance: Casual; Neat ? ?Eye Contact:Fair ? ?Speech:Slow ? ?Speech Volume:Decreased ? ?Handedness:Right ? ? ?Mood and Affect  ?Mood:Anxious; Depressed ? ?Affect:Constricted ? ? ?Thought Process  ?Thought Processes:Linear ? ?Descriptions of Associations:Circumstantial ? ?Orientation:Full (Time, Place and Person) ? ?Thought Content:Logical ? ?History of Schizophrenia/Schizoaffective disorder:Yes ? ?Duration of Psychotic Symptoms:Greater than six months ? ?Hallucinations:No data recorded ?Ideas of Reference:None ? ?Suicidal Thoughts:No data recorded ?Homicidal Thoughts:No data recorded ? ?Sensorium  ?Memory:Immediate Fair; Remote Fair ? ?Judgment:Fair ? ?Insight:Fair ? ? ?Executive Functions  ?Concentration:Fair ? ?Attention Span:Fair ? ?Recall:Fair ? ?Fund of Knowledge:Fair ? ?Language:Fair ? ? ?Psychomotor Activity  ?Psychomotor Activity:No data recorded ? ?Assets  ?Assets:Communication Skills; Desire for Improvement; Physical Health ? ? ?Sleep  ?Sleep:No data recorded ? ? ?Physical  Exam: ?Physical Exam ?Vitals and nursing note reviewed.  ?Constitutional:   ?   Appearance: Normal appearance.  ?HENT:  ?   Head: Normocephalic and atraumatic.  ?   Mouth/Throat:  ?   Pharynx: Oropharynx is clear.  ?Eyes:  ?   Pupils: Pupils are equal, round, and reactive to light.  ?Cardiovascular:  ?   Rate and Rhythm: Normal rate and regular rhythm.  ?Pulmonary:  ?   Effort: Pulmonary effort is normal.  ?   Breath sounds: Normal breath sounds.  ?Abdominal:  ?  General: Abdomen is flat.  ?   Palpations: Abdomen is soft.  ?Musculoskeletal:     ?   General: Normal range of motion.  ?Skin: ?   General: Skin is warm and dry.  ?Neurological:  ?   General: No focal deficit present.  ?   Mental Status: He is alert. Mental status is at baseline.  ?Psychiatric:     ?   Attention and Perception: He is inattentive.     ?   Mood and Affect: Mood normal. Affect is blunt.     ?   Speech: Speech is delayed.     ?   Behavior: Behavior is slowed.     ?   Thought Content: Thought content normal.     ?   Cognition and Memory: Cognition normal.  ? ?Review of Systems  ?Constitutional: Negative.   ?HENT: Negative.    ?Eyes: Negative.   ?Respiratory: Negative.    ?Cardiovascular: Negative.   ?Gastrointestinal: Negative.   ?Musculoskeletal: Negative.   ?Skin: Negative.   ?Neurological: Negative.   ?Psychiatric/Behavioral: Negative.    ?Blood pressure 105/75, pulse (!) 104, temperature 98.1 ?F (36.7 ?C), temperature source Oral, resp. rate 18, height 6\' 3"  (1.905 m), weight 93.4 kg, SpO2 100 %. Body mass index is 25.74 kg/m?. ? ? ?Treatment Plan Summary: ?Medication management and Plan ECT tomorrow.  Continue medication.  Tomorrow will be #3 and I am really hoping we will start to see some improvement after that ? ?Timothy RasmussenJohn Lenea Bywater, MD ?11/17/2021, 4:12 PM ? ?

## 2021-11-17 NOTE — BH IP Treatment Plan (Signed)
Interdisciplinary Treatment and Diagnostic Plan Update ? ?11/17/2021 ?Time of Session: 8:30 AM ?Timothy [name] ?MRN: [id number] ? ?Principal Diagnosis: Schizoaffective disorder, bipolar type ([place]) ? ?Secondary Diagnoses: Principal Problem: ?  Schizoaffective disorder, bipolar type ([place]) ? ? ?Current Medications:  ?Current Facility-Administered Medications  ?Medication Dose Route Frequency Provider Last Rate Last Admin  ? acetaminophen (TYLENOL) tablet 650 mg  650 mg Oral Q6H PRN Patrecia Pour, NP   650 mg at 11/14/21 0840  ? alum & mag hydroxide-simeth (MAALOX/MYLANTA) 200-200-20 MG/5ML suspension 30 mL  30 mL Oral Q4H PRN Patrecia Pour, NP      ? buPROPion (WELLBUTRIN XL) 24 hr tablet 150 mg  150 mg Oral Daily Clapacs, Madie Reno, MD   150 mg at 11/17/21 0825  ? fentaNYL (SUBLIMAZE) injection 25 mcg  25 mcg Intravenous Q5 min PRN [place] Service, Gijsbertus F, MD      ? FLUoxetine (PROZAC) capsule 20 mg  20 mg Oral Daily Patrecia Pour, NP   20 mg at 11/17/21 [id number]  ? ibuprofen (ADVIL) tablet 600 mg  600 mg Oral Q6H PRN Clapacs, Madie Reno, MD   600 mg at 11/14/21 0840  ? magnesium hydroxide (MILK OF MAGNESIA) suspension 30 mL  30 mL Oral Daily PRN Patrecia Pour, NP      ? nicotine (NICODERM CQ - dosed in mg/24 hours) patch 14 mg  14 mg Transdermal Daily Clapacs, Madie Reno, MD   14 mg at 11/17/21 [id number]  ? ondansetron (ZOFRAN) injection 4 mg  4 mg Intravenous Once PRN [place] Service, Jane Canary, MD      ? QUEtiapine (SEROQUEL) tablet 50 mg  50 mg Oral BID Patrecia Pour, NP   50 mg at 11/17/21 [id number]  ? QUEtiapine (SEROQUEL) tablet 600 mg  600 mg Oral QHS Clapacs, Madie Reno, MD   600 mg at 11/16/21 2106  ? ?PTA Medications: ?Medications Prior to Admission  ?Medication Sig Dispense Refill Last Dose  ? divalproex (DEPAKOTE) 500 MG DR tablet Take 1 tablet (500 mg total) by mouth every 12 (twelve) hours. 60 tablet 1 11/12/2021  ? FLUoxetine (PROZAC) 20 MG capsule Take 1 capsule (20 mg total) by mouth daily. (Patient not taking:  Reported on 07/02/2021) 30 capsule 1   ? QUEtiapine (SEROQUEL) 200 MG tablet Take 1 tablet (200 mg total) by mouth at bedtime. (Patient not taking: Reported on 07/02/2021) 30 tablet 0   ? QUEtiapine (SEROQUEL) 50 MG tablet Take 1 tablet (50 mg total) by mouth 2 (two) times daily. (Patient not taking: Reported on 07/02/2021) 60 tablet 0   ? ? ?Patient Stressors: Medication change or noncompliance   ?Substance abuse   ? ?Patient Strengths: Ability for insight  ?Motivation for treatment/growth  ? ?Treatment Modalities: Medication Management, Group therapy, Case management,  ?1 to 1 session with clinician, Psychoeducation, Recreational therapy. ? ? ?Physician Treatment Plan for Primary Diagnosis: Schizoaffective disorder, bipolar type ([place]) ?Long Term Goal(s): Improvement in symptoms so as ready for discharge  ? ?Short Term Goals: Ability to identify changes in lifestyle to reduce recurrence of condition will improve ?Ability to verbalize feelings will improve ?Ability to disclose and discuss suicidal ideas ?Ability to demonstrate self-control will improve ?Ability to identify and develop effective coping behaviors will improve ?Ability to maintain clinical measurements within normal limits will improve ?Compliance with prescribed medications will improve ?Ability to identify triggers associated with substance abuse/mental health issues will improve ? ?Medication Management: Evaluate patient's response, side effects, and tolerance of  medication regimen. ? ?Therapeutic Interventions: 1 to 1 sessions, Unit Group sessions and Medication administration. ? ?Evaluation of Outcomes: Progressing ? ?Physician Treatment Plan for Secondary Diagnosis: Principal Problem: ?  Schizoaffective disorder, bipolar type ([place]) ? ?Long Term Goal(s): Improvement in symptoms so as ready for discharge  ? ?Short Term Goals: Ability to identify changes in lifestyle to reduce recurrence of condition will improve ?Ability to verbalize feelings will  improve ?Ability to disclose and discuss suicidal ideas ?Ability to demonstrate self-control will improve ?Ability to identify and develop effective coping behaviors will improve ?Ability to maintain clinical measurements within normal limits will improve ?Compliance with prescribed medications will improve ?Ability to identify triggers associated with substance abuse/mental health issues will improve    ? ?Medication Management: Evaluate patient's response, side effects, and tolerance of medication regimen. ? ?Therapeutic Interventions: 1 to 1 sessions, Unit Group sessions and Medication administration. ? ?Evaluation of Outcomes: Progressing ? ? ?RN Treatment Plan for Primary Diagnosis: Schizoaffective disorder, bipolar type ([place]) ?Long Term Goal(s): Knowledge of disease and therapeutic regimen to maintain health will improve ? ?Short Term Goals: Ability to remain free from injury will improve, Ability to verbalize frustration and anger appropriately will improve, Ability to demonstrate self-control, Ability to participate in decision making will improve, Ability to verbalize feelings will improve, Ability to disclose and discuss suicidal ideas, Ability to identify and develop effective coping behaviors will improve, and Compliance with prescribed medications will improve ? ?Medication Management: RN will administer medications as ordered by provider, will assess and evaluate patient's response and provide education to patient for prescribed medication. RN will report any adverse and/or side effects to prescribing provider. ? ?Therapeutic Interventions: 1 on 1 counseling sessions, Psychoeducation, Medication administration, Evaluate responses to treatment, Monitor vital signs and CBGs as ordered, Perform/monitor CIWA, COWS, AIMS and Fall Risk screenings as ordered, Perform wound care treatments as ordered. ? ?Evaluation of Outcomes: Progressing ? ? ?LCSW Treatment Plan for Primary Diagnosis: Schizoaffective  disorder, bipolar type ([place]) ?Long Term Goal(s): Safe transition to appropriate next level of care at discharge, Engage patient in therapeutic group addressing interpersonal concerns. ? ?Short Term Goals: Engage patient in aftercare planning with referrals and resources, Increase social support, Increase ability to appropriately verbalize feelings, Increase emotional regulation, Facilitate acceptance of mental health diagnosis and concerns, Facilitate patient progression through stages of change regarding substance use diagnoses and concerns, Identify triggers associated with mental health/substance abuse issues, and Increase skills for wellness and recovery ? ?Therapeutic Interventions: Assess for all discharge needs, 1 to 1 time with Education officer, museum, Explore available resources and support systems, Assess for adequacy in community support network, Educate family and significant other(s) on suicide prevention, Complete Psychosocial Assessment, Interpersonal group therapy. ? ?Evaluation of Outcomes: Progressing ? ? ?Progress in Treatment: ?Attending groups: No. ?Participating in groups: No. ?Taking medication as prescribed: Yes. ?Toleration medication: Yes. ?Family/Significant other contact made: No, will contact:  if given permission.  ?Patient understands diagnosis: Yes. ?Discussing patient identified problems/goals with staff: Yes. ?Medical problems stabilized or resolved: Yes. ?Denies suicidal/homicidal ideation: Yes. ?Issues/concerns per patient self-inventory: No. ?Other: none. ? ?New problem(s) identified: No, Describe:  none identified. ? ?New Short Term/Long Term Goal(s): Patient to work towards elimination of symptoms of psychosis, medication management for mood stabilization; elimination of SI thoughts; development of comprehensive mental wellness plan. ? ?Patient Goals:  No additional goals identified at this time. Patient to continue to work towards original goals identified in initial treatment team  meeting. CSW will remain available to  patient should they voice additional treatment goals.  ?  ?Discharge Plan or Barriers: Patient continues to lack stable housing. Patient likely to discharge to shelter once psychia

## 2021-11-17 NOTE — Plan of Care (Signed)
D- Patient alert and oriented. Patient presents in a pleasant mood on assessment stating that he slept ok last night and had no complaints to voice to this Clinical research associate. Patient endorsed both depression/anxiety, rating them a "7/10" and "5/10", stating that "trying to find a new job" is the reason he feels this way. Patient denies SI, HI, AVH, and pain at this time. Patient's goal for today is to "finish treatment and see if I can go home". ? ?A- Scheduled medications administered to patient, per MD orders. Support and encouragement provided.  Routine safety checks conducted every 15 minutes.  Patient informed to notify staff with problems or concerns. ? ?R- No adverse drug reactions noted. Patient contracts for safety at this time. Patient compliant with medications and treatment plan. Patient receptive, calm, and cooperative. Patient interacts well with others on the unit. Patient remains safe at this time. ? ?Problem: Education: ?Goal: Knowledge of [place] [facility] Education information/materials will improve ?Outcome: Progressing ?Goal: Emotional status will improve ?Outcome: Progressing ?Goal: Mental status will improve ?Description: Patient will became more alert no confusion to place and situation  ?Outcome: Progressing ?Goal: Verbalization of understanding the information provided will improve ?Outcome: Progressing ?  ?Problem: Activity: ?Goal: Interest or engagement in activities will improve ?Outcome: Progressing ?Goal: Sleeping patterns will improve ?Outcome: Progressing ?  ?Problem: Coping: ?Goal: Ability to verbalize frustrations and anger appropriately will improve ?Description: Patient will verbalize frustration and anger to the staff appropriately.  ?Outcome: Progressing ?Goal: Ability to demonstrate self-control will improve ?Outcome: Progressing ?  ?Problem: Health Behavior/Discharge Planning: ?Goal: Identification of resources available to assist in meeting health care needs will improve ?Outcome:  Progressing ?Goal: Compliance with treatment plan for underlying cause of condition will improve ?Outcome: Progressing ?  ?Problem: Physical Regulation: ?Goal: Ability to maintain clinical measurements within normal limits will improve ?Outcome: Progressing ?  ?Problem: Safety: ?Goal: Periods of time without injury will increase ?Outcome: Progressing ?  ?Problem: Activity: ?Goal: Will verbalize the importance of balancing activity with adequate rest periods ?Outcome: Progressing ?  ?Problem: Education: ?Goal: Will be free of psychotic symptoms ?Description: Patient will be free of psychosis  ?Outcome: Progressing ?Goal: Knowledge of the prescribed therapeutic regimen will improve ?Outcome: Progressing ?  ?Problem: Coping: ?Goal: Coping ability will improve ?Outcome: Progressing ?Goal: Will verbalize feelings ?Outcome: Progressing ?  ?Problem: Health Behavior/Discharge Planning: ?Goal: Compliance with prescribed medication regimen will improve ?Outcome: Progressing ?  ?Problem: Nutritional: ?Goal: Ability to achieve adequate nutritional intake will improve ?Outcome: Progressing ?  ?Problem: Role Relationship: ?Goal: Ability to communicate needs accurately will improve ?Outcome: Progressing ?Goal: Ability to interact with others will improve ?Outcome: Progressing ?  ?Problem: Safety: ?Goal: Ability to redirect hostility and anger into socially appropriate behaviors will improve ?Outcome: Progressing ?Goal: Ability to remain free from injury will improve ?Outcome: Progressing ?  ?Problem: Self-Care: ?Goal: Ability to participate in self-care as condition permits will improve ?Outcome: Progressing ?  ?Problem: Self-Concept: ?Goal: Will verbalize positive feelings about self ?Outcome: Progressing ?  ?

## 2021-11-17 NOTE — Group Note (Signed)
[facility] LCSW Group Therapy Note ? ? ?Group Date: 11/17/2021 ?Start Time: 1300 ?End Time: 1400 ? ?Type of Therapy/Topic:  Group Therapy:  Feelings about Diagnosis ? ?Participation Level:  Did Not Attend  ? ? ?Description of Group:   ? This group will allow patients to explore their thoughts and feelings about diagnoses they have received. Patients will be guided to explore their level of understanding and acceptance of these diagnoses. Facilitator will encourage patients to process their thoughts and feelings about the reactions of others to their diagnosis, and will guide patients in identifying ways to discuss their diagnosis with significant others in their lives. This group will be process-oriented, with patients participating in exploration of their own experiences as well as giving and receiving support and challenge from other group members. ? ? ?Therapeutic Goals: ?1. Patient will demonstrate understanding of diagnosis as evidence by identifying two or more symptoms of the disorder:  ?2. Patient will be able to express two feelings regarding the diagnosis ?3. Patient will demonstrate ability to communicate their needs through discussion and/or role plays ? ?Summary of Patient Progress: ?X ? ? ? ?Therapeutic Modalities:   ?Cognitive Behavioral Therapy ?Brief Therapy ?Feelings Identification  ? ? ?Domnique Vanegas R Elwanda Moger, LCSW ?

## 2021-11-17 NOTE — Progress Notes (Signed)
Patient is outside, in the courtyard, playing basketball with staff and other members on the unit. Patient remains safe at this time. ?

## 2021-11-17 NOTE — Progress Notes (Signed)
Recreation Therapy Notes ? ?  ?Date: 11/17/2021 ?  ?Time: 9:55 am   ?  ?Location: Courtyard    ?  ?Behavioral response: Appropriate ?  ?Intervention Topic: Wellness ? ?Discussion/Intervention:  ?Group content today was focused on Wellness. The group defined wellness and some positive ways they make decisions for themselves. Individuals expressed reasons why they neglected any wellness in the past. Patients described ways to improve wellness skills in the future. The group explained what could happen if they did not do any wellness at all. Participants express how bad choices has affected them and others around them. Individual explained the importance of wellness. The group participated in the intervention ?Testing my Wellness? where they had a chance to identify some of their weaknesses and strengths in wellness.  ?Clinical Observations/Feedback: ?Patient came to group and was about to identify and discover wellness activities. Individual was social with peers and staff while participating in the intervention.    ?Timothy [name] LRT/CTRS  ?  ?  ? ? ? ? ? ? ? ?Timothy [name] ?11/17/2021 11:54 AM ?

## 2021-11-17 NOTE — Anesthesia Postprocedure Evaluation (Signed)
Anesthesia Post Note ? ?Patient: Timothy [name] ? ?Procedure(s) Performed: ECT TX ? ?Patient location during evaluation: PACU ?Anesthesia Type: General ?Level of consciousness: awake and alert ?Pain management: pain level controlled ?Vital Signs Assessment: post-procedure vital signs reviewed and stable ?Respiratory status: spontaneous breathing, nonlabored ventilation, respiratory function stable and patient connected to nasal cannula oxygen ?Cardiovascular status: blood pressure returned to baseline and stable ?Postop Assessment: no apparent nausea or vomiting ?Anesthetic complications: no ? ? ?No notable events documented. ? ? ?Last Vitals:  ?Vitals:  ? 11/16/21 1300 11/16/21 1305  ?BP: 112/69   ?Pulse: 66 63  ?Resp: 16 14  ?Temp:    ?SpO2: 98% 100%  ?  ?Last Pain:  ?Vitals:  ? 11/16/21 1255  ?TempSrc:   ?PainSc: 0-No pain  ? ? ?  ?  ?  ?  ?  ?  ? ?Martha Clan ? ? ? ? ?

## 2021-11-18 ENCOUNTER — Inpatient Hospital Stay: Payer: 59 | Admitting: Certified Registered"

## 2021-11-18 ENCOUNTER — Other Ambulatory Visit: Payer: Self-pay | Admitting: Psychiatry

## 2021-11-18 ENCOUNTER — Ambulatory Visit
Admit: 2021-11-18 | Discharge: 2021-11-18 | Disposition: A | Discharge status: Home / Self Care | Payer: 59 | Attending: Psychiatry | Admitting: Psychiatry

## 2021-11-18 DIAGNOSIS — F250 Schizoaffective disorder, bipolar type: Secondary | ICD-10-CM | POA: Diagnosis not present

## 2021-11-18 DIAGNOSIS — F251 Schizoaffective disorder, depressive type: Secondary | ICD-10-CM | POA: Insufficient documentation

## 2021-11-18 LAB — GLUCOSE, CAPILLARY: Glucose-Capillary: 92 mg/dL (ref 70–99)

## 2021-11-18 MED ORDER — MIDAZOLAM HCL 2 MG/2ML IJ SOLN
INTRAMUSCULAR | Status: AC
  Filled 2021-11-18: qty 2

## 2021-11-18 MED ORDER — ONDANSETRON HCL 4 MG/2ML IJ SOLN: 4.0000 mg | Freq: Once | INTRAMUSCULAR | Status: DC | PRN

## 2021-11-18 MED ORDER — METHOHEXITAL SODIUM 100 MG/10ML IV SOSY
PREFILLED_SYRINGE | INTRAVENOUS | Status: DC | PRN
  Administered 2021-11-18: 100 mg via INTRAVENOUS

## 2021-11-18 MED ORDER — SUCCINYLCHOLINE CHLORIDE 200 MG/10ML IV SOSY
PREFILLED_SYRINGE | INTRAVENOUS | Status: DC | PRN
  Administered 2021-11-18: 250 mg via INTRAVENOUS

## 2021-11-18 MED ORDER — KETOROLAC TROMETHAMINE 30 MG/ML IJ SOLN: 30.0000 mg | Freq: Once | INTRAMUSCULAR | Status: DC

## 2021-11-18 MED ORDER — BUPROPION HCL ER (XL) 150 MG PO TB24
300.0000 mg | ORAL_TABLET | Freq: Every day | ORAL | Status: DC
Start: 2021-11-19 — End: 2021-12-01
  Administered 2021-11-19 – 2021-12-01 (×13): 300 mg via ORAL
  Filled 2021-11-18 (×13): qty 2

## 2021-11-18 MED ORDER — GLYCOPYRROLATE 0.2 MG/ML IJ SOLN
INTRAMUSCULAR | Status: AC
  Filled 2021-11-18: qty 1

## 2021-11-18 MED ORDER — MIDAZOLAM HCL 2 MG/2ML IJ SOLN: 4.0000 mg | Freq: Once | INTRAMUSCULAR | Status: DC

## 2021-11-18 MED ORDER — MIDAZOLAM HCL 2 MG/2ML IJ SOLN
INTRAMUSCULAR | Status: DC | PRN
  Administered 2021-11-18: 4 mg via INTRAVENOUS

## 2021-11-18 MED ORDER — KETOROLAC TROMETHAMINE 30 MG/ML IJ SOLN
INTRAMUSCULAR | Status: DC | PRN
  Administered 2021-11-18: 30 mg via INTRAVENOUS

## 2021-11-18 MED ORDER — GLYCOPYRROLATE 0.2 MG/ML IJ SOLN
INTRAMUSCULAR | Status: DC | PRN
  Administered 2021-11-18: .2 mg via INTRAVENOUS

## 2021-11-18 MED ORDER — METHOHEXITAL SODIUM 0.5 G IJ SOLR
INTRAMUSCULAR | Status: AC
  Filled 2021-11-18: qty 500

## 2021-11-18 MED ORDER — SODIUM CHLORIDE 0.9 % IV SOLN: 500.0000 mL | Freq: Once | INTRAVENOUS | Status: AC

## 2021-11-18 MED ORDER — KETOROLAC TROMETHAMINE 30 MG/ML IJ SOLN
INTRAMUSCULAR | Status: AC
  Filled 2021-11-18: qty 1

## 2021-11-18 MED ORDER — FENTANYL CITRATE (PF) 100 MCG/2ML IJ SOLN: 25.0000 ug | INTRAMUSCULAR | Status: DC | PRN

## 2021-11-18 MED ORDER — SUCCINYLCHOLINE CHLORIDE 200 MG/10ML IV SOSY
PREFILLED_SYRINGE | INTRAVENOUS | Status: AC
  Filled 2021-11-18: qty 20

## 2021-11-18 MED ORDER — GLYCOPYRROLATE 0.2 MG/ML IJ SOLN
0.2000 mg | Freq: Once | INTRAMUSCULAR | Status: DC
Start: 2021-11-18 — End: 2021-11-19

## 2021-11-18 NOTE — Plan of Care (Signed)
?  Problem: Education: ?Goal: Emotional status will improve ?Outcome: Progressing ?Goal: Mental status will improve ?Description: Patient will became more alert no confusion to place and situation  ?Outcome: Progressing ?Goal: Verbalization of understanding the information provided will improve ?Outcome: Progressing ?  ?Problem: Activity: ?Goal: Sleeping patterns will improve ?Outcome: Progressing ?  ?Problem: Coping: ?Goal: Ability to verbalize frustrations and anger appropriately will improve ?Description: Patient will verbalize frustration and anger to the staff appropriately.  ?Outcome: Progressing ?Goal: Ability to demonstrate self-control will improve ?Outcome: Progressing ?  ?Problem: Safety: ?Goal: Periods of time without injury will increase ?Outcome: Progressing ?  ?

## 2021-11-18 NOTE — Progress Notes (Signed)
Patient pleasant and cooperative. Noted smiling at nursing staff as well as peers. Interacting appropriately. Denies any SI, HI, AVH. ECT for tomorrow, patient states it is helping some. Medication compliant. Noted in dayroom talking and socializing. ?Patient npo after midnight for ECT on tomorrow. Encouragement and support provided, Safety checks maintained. Medications given as prescribed. Pt receptive and remains safe on unit with q 15 min checks. ?

## 2021-11-18 NOTE — Progress Notes (Signed)
[facility] MD Progress Note ? ?11/18/2021 10:43 AM ?Timothy FeltyMarkie T [name]  ?MRN:  [id number] ?Subjective: Follow-up 26 year old man with schizoaffective disorder depressive type.  Patient seen and chart reviewed.  He has had 2 ECT treatments so far.  He reports he is still feeling pretty bad.  Reports on his inventory still feeling depressed having some hallucinations some suicidal ideation.  Low energy.  Does not specifically identify memory problems.  Has some aches and pains after the ECT treatment but otherwise tolerating it well ?Principal Problem: Schizoaffective disorder, bipolar type (HCC) ?Diagnosis: Principal Problem: ?  Schizoaffective disorder, bipolar type (HCC) ? ?Total Time spent with patient: 30 minutes ? ?Past Psychiatric History: Past history of schizoaffective disorder ? ?Past Medical History:  ?Past Medical History:  ?Diagnosis Date  ? Cannabis abuse   ? Paranoid schizophrenia (HCC)   ? Schizoaffective disorder, depressive type (HCC) 09/17/2014  ? History reviewed. No pertinent surgical history. ?Family History:  ?Family History  ?Problem Relation Age of Onset  ? Mental illness Brother   ? Drug abuse Brother   ? Mental illness Cousin   ? Suicidality Cousin   ? Diabetes Maternal Grandmother   ? ?Family Psychiatric  History: See previous ?Social History:  ?Social History  ? ?Substance and Sexual Activity  ?Alcohol Use Yes  ? Alcohol/week: 2.0 standard drinks  ? Types: 2 Cans of beer per week  ?   ?Social History  ? ?Substance and Sexual Activity  ?Drug Use Yes  ? 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  ?Social History  ? ?Socioeconomic History  ? Marital status: Single  ?  Spouse name: Not on file  ? Number of children: Not on file  ? Years of education: Not on file  ? Highest education level: Not on file  ?Occupational History  ? Not on file  ?Tobacco Use  ? Smoking status: Some Days  ?  Packs/day: 0.25  ?  Types: Cigarettes  ? Smokeless tobacco: Never  ?Vaping Use  ? Vaping Use: Never used  ?Substance and Sexual  Activity  ? Alcohol use: Yes  ?  Alcohol/week: 2.0 standard drinks  ?  Types: 2 Cans of beer per week  ? Drug use: Yes  ?  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 Sexual activity: Yes  ?Other Topics Concern  ? Not on file  ?Social History Narrative  ? Not on file  ? ?Social Determinants of Health  ? ?Financial Resource Strain: Not on file  ?Food Insecurity: Not on file  ?Transportation Needs: Not on file  ?Physical Activity: Not on file  ?Stress: Not on file  ?Social Connections: Not on file  ? ?Additional Social History:  ?  ?  ?  ?  ?  ?  ?  ?  ?  ?  ?  ? ?Sleep: Fair ? ?Appetite:  Fair ? ?Current Medications: ?Current Facility-Administered Medications  ?Medication Dose Route Frequency Provider Last Rate Last Admin  ? acetaminophen (TYLENOL) tablet 650 mg  650 mg Oral Q6H PRN Charm RingsLord, Jamison Y, NP   650 mg at 11/14/21 0840  ? alum & mag hydroxide-simeth (MAALOX/MYLANTA) 200-200-20 MG/5ML suspension 30 mL  30 mL Oral Q4H PRN Charm RingsLord, Jamison Y, NP      ? buPROPion (WELLBUTRIN XL) 24 hr tablet 150 mg  150 mg Oral Daily Jadarian Mckay, Jackquline DenmarkJohn T, MD   150 mg at 11/17/21 0825  ? fentaNYL (SUBLIMAZE) injection 25 mcg  25 mcg Intravenous Q5 min PRN Darleene CleaverVan Staveren, Gerrit HeckGijsbertus F, MD      ?  FLUoxetine (PROZAC) capsule 20 mg  20 mg Oral Daily Charm Rings, NP   20 mg at 11/17/21 0093  ? ibuprofen (ADVIL) tablet 600 mg  600 mg Oral Q6H PRN Andru Genter, Jackquline Denmark, MD   600 mg at 11/14/21 0840  ? magnesium hydroxide (MILK OF MAGNESIA) suspension 30 mL  30 mL Oral Daily PRN Charm Rings, NP      ? nicotine (NICODERM CQ - dosed in mg/24 hours) patch 14 mg  14 mg Transdermal Daily Gregory Dowe, Jackquline Denmark, MD   14 mg at 11/17/21 8182  ? ondansetron (ZOFRAN) injection 4 mg  4 mg Intravenous Once PRN Darleene Cleaver, Gerrit Heck, MD      ? QUEtiapine (SEROQUEL) tablet 50 mg  50 mg Oral BID Charm Rings, NP   50 mg at 11/17/21 1613  ? QUEtiapine (SEROQUEL) tablet 600 mg  600 mg Oral QHS Jenniferlynn Saad, Jackquline Denmark, MD   600 mg at 11/17/21 2100  ? ? ?Lab Results:  ?Results  for orders placed or performed during the hospital encounter of 11/16/21 (from the past 48 hour(s))  ?Glucose, capillary     Status: Abnormal  ? Collection Time: 11/16/21 12:19 PM  ?Result Value Ref Range  ? Glucose-Capillary 113 (H) 70 - 99 mg/dL  ?  Comment: Glucose reference range applies only to samples taken after fasting for at least 8 hours.  ? Comment 1 Notify RN   ? Comment 2 Document in Chart   ?Glucose, capillary     Status: None  ? Collection Time: 11/18/21  6:10 AM  ?Result Value Ref Range  ? Glucose-Capillary 92 70 - 99 mg/dL  ?  Comment: Glucose reference range applies only to samples taken after fasting for at least 8 hours.  ? ? ?Blood Alcohol level:  ?Lab Results  ?Component Value Date  ? ETH <10 10/31/2021  ? ETH <10 06/30/2021  ? ? ?Metabolic Disorder Labs: ?Lab Results  ?Component Value Date  ? HGBA1C 5.5 11/04/2021  ? MPG 111.15 11/04/2021  ? MPG 114 06/17/2021  ? ?Lab Results  ?Component Value Date  ? PROLACTIN 33.2 (H) 08/15/2016  ? ?Lab Results  ?Component Value Date  ? CHOL 149 11/04/2021  ? TRIG 56 11/04/2021  ? HDL 35 (L) 11/04/2021  ? CHOLHDL 4.3 11/04/2021  ? VLDL 11 11/04/2021  ? LDLCALC 103 (H) 11/04/2021  ? LDLCALC 111 (H) 06/17/2021  ? ? ?Physical Findings: ?AIMS:  , ,  ,  ,    ?CIWA:    ?COWS:    ? ?Musculoskeletal: ?Strength & Muscle Tone: within normal limits ?Gait & Station: normal ?Patient leans: N/A ? ?Psychiatric Specialty Exam: ? ?Presentation  ?General Appearance: Casual; Neat ? ?Eye Contact:Fair ? ?Speech:Slow ? ?Speech Volume:Decreased ? ?Handedness:Right ? ? ?Mood and Affect  ?Mood:Anxious; Depressed ? ?Affect:Constricted ? ? ?Thought Process  ?Thought Processes:Linear ? ?Descriptions of Associations:Circumstantial ? ?Orientation:Full (Time, Place and Person) ? ?Thought Content:Logical ? ?History of Schizophrenia/Schizoaffective disorder:Yes ? ?Duration of Psychotic Symptoms:Greater than six months ? ?Hallucinations:No data recorded ?Ideas of Reference:None ? ?Suicidal  Thoughts:No data recorded ?Homicidal Thoughts:No data recorded ? ?Sensorium  ?Memory:Immediate Fair; Remote Fair ? ?Judgment:Fair ? ?Insight:Fair ? ? ?Executive Functions  ?Concentration:Fair ? ?Attention Span:Fair ? ?Recall:Fair ? ?Fund of Knowledge:Fair ? ?Language:Fair ? ? ?Psychomotor Activity  ?Psychomotor Activity:No data recorded ? ?Assets  ?Assets:Communication Skills; Desire for Improvement; Physical Health ? ? ?Sleep  ?Sleep:No data recorded ? ? ?Physical Exam: ?Physical Exam ?Vitals and nursing note reviewed.  ?  Constitutional:   ?   Appearance: Normal appearance.  ?HENT:  ?   Head: Normocephalic and atraumatic.  ?   Mouth/Throat:  ?   Pharynx: Oropharynx is clear.  ?Eyes:  ?   Pupils: Pupils are equal, round, and reactive to light.  ?Cardiovascular:  ?   Rate and Rhythm: Normal rate and regular rhythm.  ?Pulmonary:  ?   Effort: Pulmonary effort is normal.  ?   Breath sounds: Normal breath sounds.  ?Abdominal:  ?   General: Abdomen is flat.  ?   Palpations: Abdomen is soft.  ?Musculoskeletal:     ?   General: Normal range of motion.  ?Skin: ?   General: Skin is warm and dry.  ?Neurological:  ?   General: No focal deficit present.  ?   Mental Status: He is alert. Mental status is at baseline.  ?Psychiatric:     ?   Attention and Perception: He is inattentive. He perceives auditory hallucinations.     ?   Mood and Affect: Mood is depressed. Affect is blunt.     ?   Speech: Speech is delayed.     ?   Behavior: Behavior is slowed.     ?   Thought Content: Thought content includes suicidal ideation. Thought content does not include suicidal plan.  ? ?Review of Systems  ?Constitutional: Negative.   ?HENT: Negative.    ?Eyes: Negative.   ?Respiratory: Negative.    ?Cardiovascular: Negative.   ?Gastrointestinal: Negative.   ?Musculoskeletal: Negative.   ?Skin: Negative.   ?Neurological: Negative.   ?Psychiatric/Behavioral:  Positive for depression, hallucinations and suicidal ideas. Negative for memory loss and  substance abuse. The patient is not nervous/anxious and does not have insomnia.   ?Blood pressure 109/78, pulse 67, temperature 98.2 ?F (36.8 ?C), temperature source Oral, resp. rate 18, height 6\' 3"  (1.905

## 2021-11-18 NOTE — Transfer of Care (Signed)
Immediate Anesthesia Transfer of Care Note ? ?Patient: Timothy [name] ? ?Procedure(s) Performed: ECT TX ? ?Patient Location: PACU ? ?Anesthesia Type:General ? ?Level of Consciousness: drowsy ? ?Airway & Oxygen Therapy: Patient Spontanous Breathing and Patient connected to nasal cannula oxygen ? ?Post-op Assessment: Report given to RN, Post -op Vital signs reviewed and stable and Patient moving all extremities ? ?Post vital signs: Reviewed and stable ? ?Last Vitals:  ?Vitals Value Taken Time  ?BP 127/64 11/18/21 1245  ?Temp    ?Pulse 89 11/18/21 1249  ?Resp 17 11/18/21 1249  ?SpO2 97 % 11/18/21 1249  ?Vitals shown include unvalidated device data. ? ?Last Pain:  ?Vitals:  ? 11/18/21 1143  ?TempSrc:   ?PainSc: 0-No pain  ?   ? ?  ? ?Complications: No notable events documented. ?

## 2021-11-18 NOTE — Anesthesia Postprocedure Evaluation (Signed)
Anesthesia Post Note ? ?Patient: Timothy [name] ? ?Procedure(s) Performed: ECT TX ? ?Patient location during evaluation: PACU ?Anesthesia Type: General ?Level of consciousness: awake and alert ?Pain management: pain level controlled ?Vital Signs Assessment: post-procedure vital signs reviewed and stable ?Respiratory status: spontaneous breathing, nonlabored ventilation, respiratory function stable and patient connected to nasal cannula oxygen ?Cardiovascular status: blood pressure returned to baseline and stable ?Postop Assessment: no apparent nausea or vomiting ?Anesthetic complications: no ? ? ?No notable events documented. ? ? ?Last Vitals:  ?Vitals:  ? 11/18/21 1249 11/18/21 1250  ?BP:  121/60  ?Pulse: 89 85  ?Resp: 17 17  ?Temp:    ?SpO2: 97% 97%  ?  ?Last Pain:  ?Vitals:  ? 11/18/21 1250  ?TempSrc:   ?PainSc: 0-No pain  ? ? ?  ?  ?  ?  ?  ?  ? ?Molli Barrows ? ? ? ? ?

## 2021-11-18 NOTE — H&P (Signed)
Timothy [name] is an 26 y.o. male.   ?Chief Complaint: Follow-up patient with ECT.  He is still reporting depressed mood.  Cooperative with treatment.  No new complaint ?HPI: Recurrent schizoaffective disorder depressive type ? ?Past Medical History:  ?Diagnosis Date  ? Cannabis abuse   ? Paranoid schizophrenia (HCC)   ? Schizoaffective disorder, depressive type (HCC) 09/17/2014  ? ? ?No past surgical history on file. ? ?Family History  ?Problem Relation Age of Onset  ? Mental illness Brother   ? Drug abuse Brother   ? Mental illness Cousin   ? Suicidality Cousin   ? Diabetes Maternal Grandmother   ? ?Social History:  reports that he has been smoking cigarettes. He has been smoking an average of .25 packs per day. He has never used smokeless tobacco. He reports current alcohol use of about 2.0 standard drinks per week. He reports current drug use. Drugs: Marijuana and MDMA (Ecstacy). ? ?Allergies:  ?Allergies  ?Allergen Reactions  ? Haldol [Haloperidol Lactate] Other (See Comments)  ?  Muscle stiffness  ? Penicillins Hives  ?  Felt like throat was closing ?Has patient had a PCN reaction causing immediate rash, facial/tongue/throat swelling, SOB or lightheadedness with hypotension: Unknown ?Has patient had a PCN reaction causing severe rash involving mucus membranes or skin necrosis: unknown ?Has patient had a PCN reaction that required hospitalization Unknown ?Has patient had a PCN reaction occurring within the last 10 years: Unknown ?If all of the above answers are "NO", then may proceed with Cephalosporin use. ?  ? Zyprexa [Olanzapine]   ?  eps  ? ? ?(Not in a hospital admission) ? ? ?Results for orders placed or performed during the hospital encounter of 11/16/21 (from the past 48 hour(s))  ?Glucose, capillary     Status: Abnormal  ? Collection Time: 11/16/21 12:19 PM  ?Result Value Ref Range  ? Glucose-Capillary 113 (H) 70 - 99 mg/dL  ?  Comment: Glucose reference range applies only to samples taken after fasting  for at least 8 hours.  ? Comment 1 Notify RN   ? Comment 2 Document in Chart   ?Glucose, capillary     Status: None  ? Collection Time: 11/18/21  6:10 AM  ?Result Value Ref Range  ? Glucose-Capillary 92 70 - 99 mg/dL  ?  Comment: Glucose reference range applies only to samples taken after fasting for at least 8 hours.  ? ?No results found. ? ?Review of Systems  ?Constitutional: Negative.   ?HENT: Negative.    ?Eyes: Negative.   ?Respiratory: Negative.    ?Cardiovascular: Negative.   ?Gastrointestinal: Negative.   ?Musculoskeletal:  Positive for myalgias.  ?Skin: Negative.   ?Neurological: Negative.   ?Psychiatric/Behavioral:  Positive for dysphoric mood, hallucinations and suicidal ideas.   ? ?There were no vitals taken for this visit. ?Physical Exam ?Vitals and nursing note reviewed.  ?Constitutional:   ?   Appearance: He is well-developed.  ?HENT:  ?   Head: Normocephalic and atraumatic.  ?Eyes:  ?   Conjunctiva/sclera: Conjunctivae normal.  ?   Pupils: Pupils are equal, round, and reactive to light.  ?Cardiovascular:  ?   Heart sounds: Normal heart sounds.  ?Pulmonary:  ?   Effort: Pulmonary effort is normal.  ?Abdominal:  ?   Palpations: Abdomen is soft.  ?Musculoskeletal:     ?   General: Normal range of motion.  ?   Cervical back: Normal range of motion.  ?Skin: ?   General: Skin is warm  and dry.  ?Neurological:  ?   General: No focal deficit present.  ?   Mental Status: He is alert.  ?Psychiatric:     ?   Attention and Perception: He is inattentive. He perceives auditory hallucinations.     ?   Mood and Affect: Mood is depressed. Affect is blunt.     ?   Speech: Speech is delayed.     ?   Behavior: Behavior is slowed.  ?  ? ?Assessment/Plan ?Today will be treatment #3.  Reviewed with the patient the goals and procedure of the treatment.  He continues to be willing and tolerating treatment. ? ?Mordecai Rasmussen, MD ?11/18/2021, 10:57 AM ? ? ? ?

## 2021-11-18 NOTE — Progress Notes (Signed)
Pt isolating to room, no interaction with peers or staff. Pt had his third ECT treatment today. He denies SI/HI and AVH. Anxiety score 0 and depression score 7/10. Pt sad and has low energy, with nothing to say. Pt ate his snack and went back to his room. ?

## 2021-11-18 NOTE — Progress Notes (Signed)
Recreation Therapy Notes ? ?Date: 11/18/2021 ? ?Time: 10:35 am   ? ?Location: Craft room  ? ?Behavioral response: N/A ?  ?Intervention Topic: Self-care  ? ?Discussion/Intervention: ?Patient refused to attend group.  ? ?Clinical Observations/Feedback:  ?Patient refused to attend group.  ?  ?Timothy [name] LRT/CTRS ? ? ? ? ? ? ? ?Timothy [name] ?11/18/2021 12:42 PM ?

## 2021-11-18 NOTE — Group Note (Signed)
[facility] LCSW Group Therapy Note ? ? ?Group Date: 11/18/2021 ?Start Time: 1300 ?End Time: 1400 ? ? ?Type of Therapy/Topic:  Group Therapy:  Emotion Regulation ? ?Participation Level:  Did Not Attend  ? ?Mood: ? ?Description of Group:   ? The purpose of this group is to assist patients in learning to regulate negative emotions and experience positive emotions. Patients will be guided to discuss ways in which they have been vulnerable to their negative emotions. These vulnerabilities will be juxtaposed with experiences of positive emotions or situations, and patients challenged to use positive emotions to combat negative ones. Special emphasis will be placed on coping with negative emotions in conflict situations, and patients will process healthy conflict resolution skills. ? ?Therapeutic Goals: ?Patient will identify two positive emotions or experiences to reflect on in order to balance out negative emotions:  ?Patient will label two or more emotions that they find the most difficult to experience:  ?Patient will be able to demonstrate positive conflict resolution skills through discussion or role plays:  ? ?Summary of Patient Progress: ? ? ?X ? ? ? ?Therapeutic Modalities:   ?Cognitive Behavioral Therapy ?Feelings Identification ?Dialectical Behavioral Therapy ? ? ?Rozann Lesches, LCSW ?

## 2021-11-18 NOTE — Plan of Care (Signed)
?  Problem: Education: ?Goal: Knowledge of [facility] Education information/materials will improve ?Outcome: Progressing ?Goal: Emotional status will improve ?Outcome: Progressing ?Goal: Mental status will improve ?Description: Patient will became more alert no confusion to place and situation  ?Outcome: Progressing ?Goal: Verbalization of understanding the information provided will improve ?Outcome: Progressing ?  ?Problem: Activity: ?Goal: Interest or engagement in activities will improve ?Outcome: Progressing ?Goal: Sleeping patterns will improve ?Outcome: Progressing ?  ?Problem: Coping: ?Goal: Ability to verbalize frustrations and anger appropriately will improve ?Description: Patient will verbalize frustration and anger to the staff appropriately.  ?Outcome: Progressing ?Goal: Ability to demonstrate self-control will improve ?Outcome: Progressing ?  ?Problem: Health Behavior/Discharge Planning: ?Goal: Identification of resources available to assist in meeting health care needs will improve ?Outcome: Progressing ?Goal: Compliance with treatment plan for underlying cause of condition will improve ?Outcome: Progressing ?  ?Problem: Physical Regulation: ?Goal: Ability to maintain clinical measurements within normal limits will improve ?Outcome: Progressing ?  ?Problem: Safety: ?Goal: Periods of time without injury will increase ?Outcome: Progressing ?  ?Problem: Activity: ?Goal: Will verbalize the importance of balancing activity with adequate rest periods ?Outcome: Progressing ?  ?Problem: Education: ?Goal: Will be free of psychotic symptoms ?Description: Patient will be free of psychosis  ?Outcome: Progressing ?Goal: Knowledge of the prescribed therapeutic regimen will improve ?Outcome: Progressing ?  ?Problem: Coping: ?Goal: Coping ability will improve ?Outcome: Progressing ?Goal: Will verbalize feelings ?Outcome: Progressing ?  ?Problem: Health Behavior/Discharge Planning: ?Goal: Compliance with  prescribed medication regimen will improve ?Outcome: Progressing ?  ?Problem: Nutritional: ?Goal: Ability to achieve adequate nutritional intake will improve ?Outcome: Progressing ?  ?Problem: Role Relationship: ?Goal: Ability to communicate needs accurately will improve ?Outcome: Progressing ?Goal: Ability to interact with others will improve ?Outcome: Progressing ?  ?Problem: Safety: ?Goal: Ability to redirect hostility and anger into socially appropriate behaviors will improve ?Outcome: Progressing ?Goal: Ability to remain free from injury will improve ?Outcome: Progressing ?  ?Problem: Self-Care: ?Goal: Ability to participate in self-care as condition permits will improve ?Outcome: Progressing ?  ?Problem: Self-Concept: ?Goal: Will verbalize positive feelings about self ?Outcome: Progressing ?  ?

## 2021-11-18 NOTE — Anesthesia Preprocedure Evaluation (Signed)
Anesthesia Evaluation  ?Patient identified by MRN, date of birth, ID band ?Patient awake ? ? ? ?Reviewed: ?Allergy & Precautions, H&P , NPO status , Patient's Chart, lab work & pertinent test results, reviewed documented beta blocker date and time  ? ?Airway ?Mallampati: II ? ? ?Neck ROM: full ? ? ? Dental ? ?(+) Poor Dentition ?  ?Pulmonary ?neg pulmonary ROS, Current Smoker,  ?  ?Pulmonary exam normal ? ? ? ? ? ? ? Cardiovascular ?negative cardio ROS ?Normal cardiovascular exam ?Rhythm:regular Rate:Normal ? ? ?  ?Neuro/Psych ?PSYCHIATRIC DISORDERS Depression Bipolar Disorder Schizophrenia negative neurological ROS ?   ? GI/Hepatic ?negative GI ROS, Neg liver ROS,   ?Endo/Other  ?negative endocrine ROS ? Renal/GU ?negative Renal ROS  ?negative genitourinary ?  ?Musculoskeletal ? ? Abdominal ?  ?Peds ? Hematology ?negative hematology ROS ?(+)   ?Anesthesia Other Findings ?Past Medical History: ?No date: Cannabis abuse ?No date: Paranoid schizophrenia (HCC) ?09/17/2014: Schizoaffective disorder, depressive type (HCC) ?History reviewed. No pertinent surgical history. ?BMI   ? Body Mass Index: 25.74 kg/m?  ?  ? Reproductive/Obstetrics ?negative OB ROS ? ?  ? ? ? ? ? ? ? ? ? ? ? ? ? ?  ?  ? ? ? ? ? ? ? ? ?Anesthesia Physical ?Anesthesia Plan ? ?ASA: 2 ? ?Anesthesia Plan: General  ? ?Post-op Pain Management:   ? ?Induction:  ? ?PONV Risk Score and Plan:  ? ?Airway Management Planned:  ? ?Additional Equipment:  ? ?Intra-op Plan:  ? ?Post-operative Plan:  ? ?Informed Consent: I have reviewed the patients History and Physical, chart, labs and discussed the procedure including the risks, benefits and alternatives for the proposed anesthesia with the patient or authorized representative who has indicated his/her understanding and acceptance.  ? ? ? ?Dental Advisory Given ? ?Plan Discussed with: CRNA ? ?Anesthesia Plan Comments:   ? ? ? ? ? ? ?Anesthesia Quick Evaluation ? ?

## 2021-11-18 NOTE — Progress Notes (Signed)
Pt presents with depressed mood, affect congruent. Pt is observed in bed, withdrawn. Timothy [name] states he continues to feel depressed, rating his depression at 6/10 on scale, 0 being no depression, 10 being worst. Patient states he does feel ECT is helping but continues to feel sad, withdrawn and hopeless. He states '' I was doing really good before, I had two jobs, a car, an apartment. Now I'm looking at going to a shelter. '' Explored with patient and allowed to ventilate and support given. Patient also reported generalized muscle aches and states '' yeah all my muscles just ache a bit. '' Patient educated that this is related to current ECT treatment and that medications are available to help prevent muscle aches. Writer also discussed above in treatment team and MD aware. Patient confirmed he has been NPO and is aware will receive am medications upon return to the unit after treatment. He denies any SI or HI at this time and no signs that patient is responding to internal stimuli. Pt has been isolative thus far throughout shift. In no acute distress at this time. Will con't to monitor. Pt is safe.  ?

## 2021-11-19 ENCOUNTER — Other Ambulatory Visit: Payer: Self-pay | Admitting: Psychiatry

## 2021-11-19 DIAGNOSIS — F250 Schizoaffective disorder, bipolar type: Secondary | ICD-10-CM | POA: Diagnosis not present

## 2021-11-19 MED ORDER — QUETIAPINE FUMARATE 200 MG PO TABS
300.0000 mg | ORAL_TABLET | Freq: Every day | ORAL | Status: DC
  Administered 2021-11-19 – 2021-11-30 (×12): 300 mg via ORAL
  Filled 2021-11-19 (×13): qty 1

## 2021-11-19 NOTE — Progress Notes (Signed)
Recreation Therapy Notes  Date: 11/19/2021   Time: 10:40 am     Location: Court yard    Behavioral response: N/A   Intervention Topic: Values    Discussion/Intervention: Patient refused to attend group.    Clinical Observations/Feedback:  Patient refused to attend group.    Erian Lariviere LRT/CTRS        Dylann Gallier 11/19/2021 11:11 AM

## 2021-11-19 NOTE — Plan of Care (Signed)
D- Patient alert and oriented. Patient presented in a sad, but pleasant mood on assessment reporting that he slept "fair" last  night and had complaints of lower back pain, rating it an "8/10". Patient did request PRN pain medication from this writer. Patient endorsed SI on his self-inventory, reporting that he experiences this sometimes, however, he denied any self-harming thoughts when this writer asked him about this. Patient contracted for safety with this Clinical research associate. Patient also endorsed depression, anxiety and hopelessness, stating that he is "just tired". Patient denied HI/AVH. Patient could not voice to this writer why he is feeling this way, "I'm not too sure". When asked about how he was doing overall, patient stated "doing good, just anxiety". Patient had no stated goals for today.  A- Scheduled medications administered to patient, per MD orders. Support and encouragement provided.  Routine safety checks conducted every 15 minutes.  Patient informed to notify staff with problems or concerns.  R- No adverse drug reactions noted. Patient contracts for safety at this time. Patient compliant with medications and treatment plan. Patient receptive, calm, and cooperative. Patient remains safe at this time.  Problem: Education: Goal: Knowledge of [facility] Education information/materials will improve Outcome: Progressing Goal: Emotional status will improve Outcome: Progressing Goal: Mental status will improve Description: Patient will became more alert no confusion to place and situation  Outcome: Progressing Goal: Verbalization of understanding the information provided will improve Outcome: Progressing   Problem: Activity: Goal: Interest or engagement in activities will improve Outcome: Progressing Goal: Sleeping patterns will improve Outcome: Progressing   Problem: Coping: Goal: Ability to verbalize frustrations and anger appropriately will improve Description: Patient will  verbalize frustration and anger to the staff appropriately.  Outcome: Progressing Goal: Ability to demonstrate self-control will improve Outcome: Progressing   Problem: Health Behavior/Discharge Planning: Goal: Identification of resources available to assist in meeting health care needs will improve Outcome: Progressing Goal: Compliance with treatment plan for underlying cause of condition will improve Outcome: Progressing   Problem: Physical Regulation: Goal: Ability to maintain clinical measurements within normal limits will improve Outcome: Progressing   Problem: Safety: Goal: Periods of time without injury will increase Outcome: Progressing   Problem: Activity: Goal: Will verbalize the importance of balancing activity with adequate rest periods Outcome: Progressing   Problem: Education: Goal: Will be free of psychotic symptoms Description: Patient will be free of psychosis  Outcome: Progressing Goal: Knowledge of the prescribed therapeutic regimen will improve Outcome: Progressing   Problem: Coping: Goal: Coping ability will improve Outcome: Progressing Goal: Will verbalize feelings Outcome: Progressing   Problem: Health Behavior/Discharge Planning: Goal: Compliance with prescribed medication regimen will improve Outcome: Progressing   Problem: Nutritional: Goal: Ability to achieve adequate nutritional intake will improve Outcome: Progressing   Problem: Role Relationship: Goal: Ability to communicate needs accurately will improve Outcome: Progressing Goal: Ability to interact with others will improve Outcome: Progressing   Problem: Safety: Goal: Ability to redirect hostility and anger into socially appropriate behaviors will improve Outcome: Progressing Goal: Ability to remain free from injury will improve Outcome: Progressing   Problem: Self-Care: Goal: Ability to participate in self-care as condition permits will improve Outcome: Progressing    Problem: Self-Concept: Goal: Will verbalize positive feelings about self Outcome: Progressing

## 2021-11-19 NOTE — Progress Notes (Signed)
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  He is spending a lot more time in bed the last couple days and describes himself as very tired.  Unclear why he may be having this reaction to the ECT.  Hard to even interview at this point as he is so sleepy much of the time Principal Problem: Schizoaffective disorder, bipolar type (HCC) Diagnosis: Principal Problem:   Schizoaffective disorder, bipolar type (HCC)  Total Time spent with patient: 30 minutes  Past Psychiatric History: Past history of schizoaffective disorder  Past Medical History:  Past Medical History:  Diagnosis Date   Cannabis abuse    Paranoid schizophrenia (HCC)    Schizoaffective disorder, depressive type (HCC) 09/17/2014   History reviewed. No pertinent surgical history. Family History:  Family History  Problem Relation Age of Onset   Mental illness Brother    Drug abuse Brother    Mental illness Cousin    Suicidality Cousin    Diabetes Maternal Grandmother    Family Psychiatric  History: See previous Social History:  Social History   Substance and Sexual Activity  Alcohol Use Yes   Alcohol/week: 2.0 standard drinks   Types: 2 Cans of beer per week     Social History   Substance and Sexual Activity  Drug Use Yes   Types: Marijuana, MDMA Chiropodist(Ecstacy)    Social History   Socioeconomic History   Marital status: Single    Spouse name: Not on file   Number of children: Not on file   Years of education: Not on file   Highest education level: Not on file  Occupational History   Not on file  Tobacco Use   Smoking status: Some Days    Packs/day: 0.25    Types: Cigarettes   Smokeless tobacco: Never  Vaping Use   Vaping Use: Never used  Substance and Sexual Activity   Alcohol use: Yes    Alcohol/week: 2.0 standard drinks    Types: 2 Cans of beer per week   Drug use: Yes     Types: Marijuana, MDMA (Ecstacy)   Sexual activity: Yes  Other Topics Concern   Not on file  Social History Narrative   Not on file   Social Determinants of Health   Financial Resource Strain: Not on file  Food Insecurity: Not on file  Transportation Needs: Not on file  Physical Activity: Not on file  Stress: Not on file  Social Connections: Not on file   Additional Social History:                         Sleep: Fair  Appetite:  Fair  Current Medications: Current Facility-Administered Medications  Medication Dose Route Frequency Provider Last Rate Last Admin   acetaminophen (TYLENOL) tablet 650 mg  650 mg Oral Q6H PRN Charm RingsLord, Jamison Y, NP   650 mg at 11/19/21 0825   alum & mag hydroxide-simeth (MAALOX/MYLANTA) 200-200-20 MG/5ML suspension 30 mL  30 mL Oral Q4H PRN Charm RingsLord, Jamison Y, NP       buPROPion (WELLBUTRIN XL) 24 hr tablet 300 mg  300 mg Oral Daily Kylei Purington T, MD   300 mg at 11/19/21 0823   fentaNYL (SUBLIMAZE) injection 25 mcg  25 mcg Intravenous Q5 min PRN Darleene CleaverVan Staveren, Gijsbertus F, MD       FLUoxetine (PROZAC) capsule 20 mg  20 mg Oral Daily Charm Rings, NP   20 mg at 11/19/21 1884   ibuprofen (ADVIL) tablet 600 mg  600 mg Oral Q6H PRN Ameliya Nicotra, Jackquline Denmark, MD   600 mg at 11/19/21 0825   magnesium hydroxide (MILK OF MAGNESIA) suspension 30 mL  30 mL Oral Daily PRN Charm Rings, NP       nicotine (NICODERM CQ - dosed in mg/24 hours) patch 14 mg  14 mg Transdermal Daily Clair Bardwell, Jackquline Denmark, MD   14 mg at 11/19/21 0824   ondansetron (ZOFRAN) injection 4 mg  4 mg Intravenous Once PRN Darleene Cleaver, Gerrit Heck, MD       QUEtiapine (SEROQUEL) tablet 300 mg  300 mg Oral QHS Riana Tessmer, Jackquline Denmark, MD        Lab Results:  Results for orders placed or performed during the hospital encounter of 11/16/21 (from the past 48 hour(s))  Glucose, capillary     Status: None   Collection Time: 11/18/21  6:10 AM  Result Value Ref Range   Glucose-Capillary 92 70 - 99 mg/dL     Comment: Glucose reference range applies only to samples taken after fasting for at least 8 hours.    Blood Alcohol level:  Lab Results  Component Value Date   ETH <10 10/31/2021   ETH <10 06/30/2021    Metabolic Disorder Labs: Lab Results  Component Value Date   HGBA1C 5.5 11/04/2021   MPG 111.15 11/04/2021   MPG 114 06/17/2021   Lab Results  Component Value Date   PROLACTIN 33.2 (H) 08/15/2016   Lab Results  Component Value Date   CHOL 149 11/04/2021   TRIG 56 11/04/2021   HDL 35 (L) 11/04/2021   CHOLHDL 4.3 11/04/2021   VLDL 11 11/04/2021   LDLCALC 103 (H) 11/04/2021   LDLCALC 111 (H) 06/17/2021    Physical Findings: AIMS:  , ,  ,  ,    CIWA:    COWS:     Musculoskeletal: Strength & Muscle Tone: within normal limits Gait & Station: normal Patient leans: N/A  Psychiatric Specialty Exam:  Presentation  General Appearance: Casual; Neat  Eye Contact:Fair  Speech:Slow  Speech Volume:Decreased  Handedness:Right   Mood and Affect  Mood:Anxious; Depressed  Affect:Constricted   Thought Process  Thought Processes:Linear  Descriptions of Associations:Circumstantial  Orientation:Full (Time, Place and Person)  Thought Content:Logical  History of Schizophrenia/Schizoaffective disorder:Yes  Duration of Psychotic Symptoms:Greater than six months  Hallucinations:No data recorded Ideas of Reference:None  Suicidal Thoughts:No data recorded Homicidal Thoughts:No data recorded  Sensorium  Memory:Immediate Fair; Remote Fair  Judgment:Fair  Insight:Fair   Executive Functions  Concentration:Fair  Attention Span:Fair  Recall:Fair  Fund of Knowledge:Fair  Language:Fair   Psychomotor Activity  Psychomotor Activity:No data recorded  Assets  Assets:Communication Skills; Desire for Improvement; Physical Health   Sleep  Sleep:No data recorded   Physical Exam: Physical Exam Vitals and nursing note reviewed.  Constitutional:       Appearance: Normal appearance.  HENT:     Head: Normocephalic and atraumatic.     Mouth/Throat:     Pharynx: Oropharynx is clear.  Eyes:     Pupils: Pupils are equal, round, and reactive to light.  Cardiovascular:     Rate and Rhythm: Normal rate and regular rhythm.  Pulmonary:     Effort: Pulmonary effort is normal.     Breath sounds: Normal breath sounds.  Abdominal:     General: Abdomen is flat.     Palpations:  Abdomen is soft.  Musculoskeletal:        General: Normal range of motion.  Skin:    General: Skin is warm and dry.  Neurological:     General: No focal deficit present.     Mental Status: He is alert. Mental status is at baseline.  Psychiatric:        Attention and Perception: He is inattentive.        Mood and Affect: Mood normal. Affect is blunt.        Speech: Speech is delayed.        Behavior: Behavior is slowed.        Cognition and Memory: Memory is impaired.   Review of Systems  Constitutional:  Positive for malaise/fatigue.  HENT: Negative.    Eyes: Negative.   Respiratory: Negative.    Cardiovascular: Negative.   Gastrointestinal: Negative.   Musculoskeletal: Negative.   Skin: Negative.   Neurological: Negative.   Psychiatric/Behavioral:  Positive for depression.   Blood pressure 118/69, pulse 71, temperature 98.2 F (36.8 C), temperature source Oral, resp. rate 18, height 6\' 3"  (1.905 m), weight 93.4 kg, SpO2 100 %. Body mass index is 25.74 kg/m.   Treatment Plan Summary: Medication management and Plan medicine reviewed and I am trying to minimize anything else that may be sedating during the day.  Discontinue the daytime Seroquel and decrease the overall dose of Seroquel quite a bit.  Next ECT treatment tomorrow if we are not seeing some turnaround in this after the weekend and then I think it would be right to stop the ECT.  , MD 11/19/2021, 2:49 PM

## 2021-11-19 NOTE — Group Note (Signed)
[facility] LCSW Group Therapy Note   Group Date: 11/19/2021 Start Time: 1300 End Time: 1400   Type of Therapy/Topic:  Group Therapy:  Balance in Life  Participation Level:  Did Not Attend   Description of Group:    This group will address the concept of balance and how it feels and looks when one is unbalanced. Patients will be encouraged to process areas in their lives that are out of balance, and identify reasons for remaining unbalanced. Facilitators will guide patients utilizing problem- solving interventions to address and correct the stressor making their life unbalanced. Understanding and applying boundaries will be explored and addressed for obtaining  and maintaining a balanced life. Patients will be encouraged to explore ways to assertively make their unbalanced needs known to significant others in their lives, using other group members and facilitator for support and feedback.  Therapeutic Goals: Patient will identify two or more emotions or situations they have that consume much of in their lives. Patient will identify signs/triggers that life has become out of balance:  Patient will identify two ways to set boundaries in order to achieve balance in their lives:  Patient will demonstrate ability to communicate their needs through discussion and/or role plays  Summary of Patient Progress: Patient did not attend group despite encouraged participation.     Therapeutic Modalities:   Cognitive Behavioral Therapy Solution-Focused Therapy Assertiveness Training   Christinamarie Tall W Tawnie Ehresman, LCSWA 

## 2021-11-19 NOTE — Progress Notes (Signed)
Patient has been in bed, sleeping, since lunch. Patient remains safe on the unit.

## 2021-11-20 ENCOUNTER — Inpatient Hospital Stay: Payer: 59 | Admitting: Anesthesiology

## 2021-11-20 ENCOUNTER — Ambulatory Visit (HOSPITAL_BASED_OUTPATIENT_CLINIC_OR_DEPARTMENT_OTHER)
Admit: 2021-11-20 | Discharge: 2021-11-20 | Disposition: A | Discharge status: Home / Self Care | Payer: 59 | Source: Ambulatory Visit | Attending: Psychiatry | Admitting: Psychiatry

## 2021-11-20 DIAGNOSIS — F251 Schizoaffective disorder, depressive type: Secondary | ICD-10-CM

## 2021-11-20 DIAGNOSIS — F250 Schizoaffective disorder, bipolar type: Secondary | ICD-10-CM | POA: Diagnosis not present

## 2021-11-20 LAB — GLUCOSE, CAPILLARY: Glucose-Capillary: 94 mg/dL (ref 70–99)

## 2021-11-20 MED ORDER — MIDAZOLAM HCL 2 MG/2ML IJ SOLN
4.0000 mg | Freq: Once | INTRAMUSCULAR | Status: AC
  Administered 2021-11-20: 4 mg via INTRAVENOUS

## 2021-11-20 MED ORDER — GLYCOPYRROLATE 0.2 MG/ML IJ SOLN
0.2000 mg | Freq: Once | INTRAMUSCULAR | Status: AC
  Administered 2021-11-20: 0.2 mg via INTRAVENOUS

## 2021-11-20 MED ORDER — ONDANSETRON HCL 4 MG/2ML IJ SOLN: 4.0000 mg | Freq: Once | INTRAMUSCULAR | Status: DC | PRN

## 2021-11-20 MED ORDER — GLYCOPYRROLATE 0.2 MG/ML IJ SOLN
INTRAMUSCULAR | Status: AC
Start: 2021-11-20 — End: 2021-11-21
  Filled 2021-11-20: qty 1

## 2021-11-20 MED ORDER — METHOHEXITAL SODIUM 100 MG/10ML IV SOSY
PREFILLED_SYRINGE | INTRAVENOUS | Status: DC | PRN
  Administered 2021-11-20: 100 mg via INTRAVENOUS

## 2021-11-20 MED ORDER — SUCCINYLCHOLINE CHLORIDE 200 MG/10ML IV SOSY
PREFILLED_SYRINGE | INTRAVENOUS | Status: DC | PRN
  Administered 2021-11-20: 250 mg via INTRAVENOUS

## 2021-11-20 MED ORDER — ACETAMINOPHEN 160 MG/5ML PO SOLN: 325.0000 mg | ORAL | Status: DC | PRN

## 2021-11-20 MED ORDER — ACETAMINOPHEN 325 MG PO TABS: 650.0000 mg | ORAL_TABLET | Freq: Once | ORAL | Status: DC | PRN

## 2021-11-20 MED ORDER — MIDAZOLAM HCL 2 MG/2ML IJ SOLN
INTRAMUSCULAR | Status: AC
  Filled 2021-11-20: qty 2

## 2021-11-20 MED ORDER — SODIUM CHLORIDE 0.9 % IV SOLN: 500.0000 mL | Freq: Once | INTRAVENOUS | Status: AC

## 2021-11-20 MED ORDER — KETOROLAC TROMETHAMINE 30 MG/ML IJ SOLN
INTRAMUSCULAR | Status: AC
  Filled 2021-11-20: qty 1

## 2021-11-20 MED ORDER — KETOROLAC TROMETHAMINE 30 MG/ML IJ SOLN
30.0000 mg | Freq: Once | INTRAMUSCULAR | Status: AC
  Administered 2021-11-20: 30 mg via INTRAVENOUS

## 2021-11-20 NOTE — Progress Notes (Signed)
Patient isolative to his room tonight, denies SI/HI/AVH. Pt presents flat and depressed. Pt did come get snack and QHS medications. Pt given education, support, and encouragement to be active in his treatment plan. Pt being monitored Q 15 minutes for safety per unit protocol. Pt remains safe on the unit. Pt given education on ECT that he has scheduled today (11/20/21)

## 2021-11-20 NOTE — Progress Notes (Signed)
Patient awake/alert x4. Calm, cooperative. Noted small "cut" in tongue. Dr. Ronni Rumble: anesthesia aware.

## 2021-11-20 NOTE — Group Note (Signed)
[facility] LCSW Group Therapy Note   Group Date: 11/20/2021 Start Time: 1300 End Time: 1400  Type of Therapy and Topic:  Group Therapy:  Feelings around Relapse and Recovery  Participation Level:  Did Not Attend   Mood:  Description of Group:    Patients in this group will discuss emotions they experience before and after a relapse. They will process how experiencing these feelings, or avoidance of experiencing them, relates to having a relapse. Facilitator will guide patients to explore emotions they have related to recovery. Patients will be encouraged to process which emotions are more powerful. They will be guided to discuss the emotional reaction significant others in their lives may have to patients' relapse or recovery. Patients will be assisted in exploring ways to respond to the emotions of others without this contributing to a relapse.  Therapeutic Goals: Patient will identify two or more emotions that lead to relapse for them:  Patient will identify two emotions that result when they relapse:  Patient will identify two emotions related to recovery:  Patient will demonstrate ability to communicate their needs through discussion and/or role plays.   Summary of Patient Progress:  Patient did not attend group despite encouraged participation.    Therapeutic Modalities:   Cognitive Behavioral Therapy Solution-Focused Therapy Assertiveness Training Relapse Prevention Therapy   Sinahi Knights W Zyhir Cappella, LCSWA 

## 2021-11-20 NOTE — Final Progress Note (Signed)
Dr. Erenest Rasher examined patient, instructed to use ice, ice pack given to patient, alert staff if any further discomfort or issues, patient verbalizes understanding.

## 2021-11-20 NOTE — H&P (Signed)
Timothy [name] is an 26 y.o. male.   Chief Complaint: Patient reports feeling a little bit better.  Not as sedated today.  No new physical complaints HPI: Severe depression as part of schizoaffective disorder  Past Medical History:  Diagnosis Date   Cannabis abuse    Paranoid schizophrenia (HCC)    Schizoaffective disorder, depressive type (HCC) 09/17/2014    History reviewed. No pertinent surgical history.  Family History  Problem Relation Age of Onset   Mental illness Brother    Drug abuse Brother    Mental illness Cousin    Suicidality Cousin    Diabetes Maternal Grandmother    Social History:  reports that he has been smoking cigarettes. He has been smoking an average of .25 packs per day. He has never used smokeless tobacco. He reports current alcohol use of about 2.0 standard drinks per week. He reports current drug use. Drugs: Marijuana and MDMA (Ecstacy).  Allergies:  Allergies  Allergen Reactions   Haldol [Haloperidol Lactate] Other (See Comments)    Muscle stiffness   Penicillins Hives    Felt like throat was closing Has patient had a PCN reaction causing immediate rash, facial/tongue/throat swelling, SOB or lightheadedness with hypotension: Unknown Has patient had a PCN reaction causing severe rash involving mucus membranes or skin necrosis: unknown Has patient had a PCN reaction that required hospitalization Unknown Has patient had a PCN reaction occurring within the last 10 years: Unknown If all of the above answers are "NO", then may proceed with Cephalosporin use.    Zyprexa [Olanzapine]     eps    (Not in a hospital admission)   Results for orders placed or performed during the hospital encounter of 11/18/21 (from the past 48 hour(s))  Glucose, capillary     Status: None   Collection Time: 11/20/21  6:16 AM  Result Value Ref Range   Glucose-Capillary 94 70 - 99 mg/dL    Comment: Glucose reference range applies only to samples taken after fasting for at  least 8 hours.   No results found.  Review of Systems  Constitutional: Negative.   HENT: Negative.    Eyes: Negative.   Respiratory: Negative.    Cardiovascular: Negative.   Gastrointestinal: Negative.   Musculoskeletal: Negative.   Skin: Negative.   Neurological: Negative.   Psychiatric/Behavioral:  Positive for dysphoric mood.    Blood pressure 132/81, pulse 78, temperature 97.8 F (36.6 C), temperature source Temporal, resp. rate 16, height 6\' 3"  (1.905 m), SpO2 95 %. Physical Exam Vitals and nursing note reviewed.  Constitutional:      Appearance: He is well-developed.  HENT:     Head: Normocephalic and atraumatic.  Eyes:     Conjunctiva/sclera: Conjunctivae normal.     Pupils: Pupils are equal, round, and reactive to light.  Cardiovascular:     Heart sounds: Normal heart sounds.  Pulmonary:     Effort: Pulmonary effort is normal.  Abdominal:     Palpations: Abdomen is soft.  Musculoskeletal:        General: Normal range of motion.     Cervical back: Normal range of motion.  Skin:    General: Skin is warm and dry.  Neurological:     General: No focal deficit present.     Mental Status: He is alert.  Psychiatric:        Attention and Perception: Attention normal.        Mood and Affect: Mood is depressed. Affect is blunt.  Speech: Speech is delayed.     Assessment/Plan Today is treatment #4 we will reassess over the weekend to see if it looks like we are getting improvement  Mordecai Rasmussen, MD 11/20/2021, 1:36 PM

## 2021-11-20 NOTE — Plan of Care (Signed)
D- Patient alert and oriented. Patient presents in a sad, but pleasant mood on assessment stating that he slept "alright" last night and had no complaints to voice to this Clinical research associate. Patient endorsed "a little bit" of depression and anxiety, but he couldn't put it into words why he is feeling this way. Patient denies SI, HI, AVH, and pain at this time. Patient's goal for today is to "finish ECT and go home".  A- Scheduled medications administered to patient, per MD orders. Support and encouragement provided.  Routine safety checks conducted every 15 minutes.  Patient informed to notify staff with problems or concerns.  R- No adverse drug reactions noted. Patient contracts for safety at this time. Patient compliant with medications and treatment plan. Patient receptive, calm, and cooperative. Patient interacts well with others on the unit.  Patient remains safe at this time.  Problem: Education: Goal: Knowledge of [facility] Education information/materials will improve Outcome: Progressing Goal: Emotional status will improve Outcome: Progressing Goal: Mental status will improve Description: Patient will became more alert no confusion to place and situation  Outcome: Progressing Goal: Verbalization of understanding the information provided will improve Outcome: Progressing   Problem: Activity: Goal: Interest or engagement in activities will improve Outcome: Progressing Goal: Sleeping patterns will improve Outcome: Progressing   Problem: Coping: Goal: Ability to verbalize frustrations and anger appropriately will improve Description: Patient will verbalize frustration and anger to the staff appropriately.  Outcome: Progressing Goal: Ability to demonstrate self-control will improve Outcome: Progressing   Problem: Health Behavior/Discharge Planning: Goal: Identification of resources available to assist in meeting health care needs will improve Outcome: Progressing Goal: Compliance  with treatment plan for underlying cause of condition will improve Outcome: Progressing   Problem: Physical Regulation: Goal: Ability to maintain clinical measurements within normal limits will improve Outcome: Progressing   Problem: Safety: Goal: Periods of time without injury will increase Outcome: Progressing   Problem: Activity: Goal: Will verbalize the importance of balancing activity with adequate rest periods Outcome: Progressing   Problem: Education: Goal: Will be free of psychotic symptoms Description: Patient will be free of psychosis  Outcome: Progressing Goal: Knowledge of the prescribed therapeutic regimen will improve Outcome: Progressing   Problem: Coping: Goal: Coping ability will improve Outcome: Progressing Goal: Will verbalize feelings Outcome: Progressing   Problem: Health Behavior/Discharge Planning: Goal: Compliance with prescribed medication regimen will improve Outcome: Progressing   Problem: Nutritional: Goal: Ability to achieve adequate nutritional intake will improve Outcome: Progressing   Problem: Role Relationship: Goal: Ability to communicate needs accurately will improve Outcome: Progressing Goal: Ability to interact with others will improve Outcome: Progressing   Problem: Safety: Goal: Ability to redirect hostility and anger into socially appropriate behaviors will improve Outcome: Progressing Goal: Ability to remain free from injury will improve Outcome: Progressing   Problem: Self-Care: Goal: Ability to participate in self-care as condition permits will improve Outcome: Progressing   Problem: Self-Concept: Goal: Will verbalize positive feelings about self Outcome: Progressing

## 2021-11-20 NOTE — Anesthesia Procedure Notes (Signed)
Procedure Name: General with mask airway Date/Time: 11/20/2021 12:20 PM Performed by: Doreen Salvage, CRNA Pre-anesthesia Checklist: Patient identified, Emergency Drugs available, Suction available and Patient being monitored Patient Re-evaluated:Patient Re-evaluated prior to induction Oxygen Delivery Method: Circle system utilized Preoxygenation: Pre-oxygenation with 100% oxygen

## 2021-11-20 NOTE — Progress Notes (Signed)
Patient just arrived back onto the unit from ECT.

## 2021-11-20 NOTE — Progress Notes (Signed)
[facility] MD Progress Note  11/20/2021 3:46 PM EDY [name]  MRN:  [id number] Subjective: Follow-up 26 year old man with schizoaffective disorder.  Undergoing ECT.  Fourth treatment today.  He said he thought he might feel a little bit better prior to treatment.  Certainly is less sedated today after cutting back on some medicine. Principal Problem: Schizoaffective disorder, bipolar type (HCC) Diagnosis: Principal Problem:   Schizoaffective disorder, bipolar type (HCC)  Total Time spent with patient: 30 minutes  Past Psychiatric History: Past history of recurrent depressive symptoms with psychosis  Past Medical History:  Past Medical History:  Diagnosis Date   Cannabis abuse    Paranoid schizophrenia (HCC)    Schizoaffective disorder, depressive type (HCC) 09/17/2014   History reviewed. No pertinent surgical history. Family History:  Family History  Problem Relation Age of Onset   Mental illness Brother    Drug abuse Brother    Mental illness Cousin    Suicidality Cousin    Diabetes Maternal Grandmother    Family Psychiatric  History: See previous Social History:  Social History   Substance and Sexual Activity  Alcohol Use Yes   Alcohol/week: 2.0 standard drinks   Types: 2 Cans of beer per week     Social History   Substance and Sexual Activity  Drug Use Yes   Types: Marijuana, MDMA Chiropodist)    Social History   Socioeconomic History   Marital status: Single    Spouse name: Not on file   Number of children: Not on file   Years of education: Not on file   Highest education level: Not on file  Occupational History   Not on file  Tobacco Use   Smoking status: Some Days    Packs/day: 0.25    Types: Cigarettes   Smokeless tobacco: Never  Vaping Use   Vaping Use: Never used  Substance and Sexual Activity   Alcohol use: Yes    Alcohol/week: 2.0 standard drinks    Types: 2 Cans of beer per week   Drug use: Yes    Types: Marijuana, MDMA (Ecstacy)   Sexual activity:  Yes  Other Topics Concern   Not on file  Social History Narrative   Not on file   Social Determinants of Health   Financial Resource Strain: Not on file  Food Insecurity: Not on file  Transportation Needs: Not on file  Physical Activity: Not on file  Stress: Not on file  Social Connections: Not on file   Additional Social History:                         Sleep: Fair  Appetite:  Fair  Current Medications: Current Facility-Administered Medications  Medication Dose Route Frequency Provider Last Rate Last Admin   acetaminophen (TYLENOL) tablet 650 mg  650 mg Oral Q6H PRN Charm Rings, NP   650 mg at 11/19/21 0825   alum & mag hydroxide-simeth (MAALOX/MYLANTA) 200-200-20 MG/5ML suspension 30 mL  30 mL Oral Q4H PRN Charm Rings, NP       buPROPion (WELLBUTRIN XL) 24 hr tablet 300 mg  300 mg Oral Daily Waunita Sandstrom T, MD   300 mg at 11/20/21 0813   fentaNYL (SUBLIMAZE) injection 25 mcg  25 mcg Intravenous Q5 min PRN Darleene Cleaver, Gijsbertus F, MD       FLUoxetine (PROZAC) capsule 20 mg  20 mg Oral Daily Charm Rings, NP   20 mg at 11/20/21 0813   glycopyrrolate (  ROBINUL) 0.2 MG/ML injection            ibuprofen (ADVIL) tablet 600 mg  600 mg Oral Q6H PRN Lakiya Cottam T, MD   600 mg at 11/19/21 0825   ketorolac (TORADOL) 30 MG/ML injection            magnesium hydroxide (MILK OF MAGNESIA) suspension 30 mL  30 mL Oral Daily PRN Charm Rings, NP       nicotine (NICODERM CQ - dosed in mg/24 hours) patch 14 mg  14 mg Transdermal Daily Laquitha Heslin T, MD   14 mg at 11/20/21 0814   ondansetron (ZOFRAN) injection 4 mg  4 mg Intravenous Once PRN Darleene Cleaver, Gerrit Heck, MD       QUEtiapine (SEROQUEL) tablet 300 mg  300 mg Oral QHS Brihany Butch T, MD   300 mg at 11/19/21 2151   Facility-Administered Medications Ordered in Other Encounters  Medication Dose Route Frequency Provider Last Rate Last Admin   acetaminophen (TYLENOL) tablet 650 mg  650 mg Oral Once PRN  Reed Breech, MD       Or   acetaminophen (TYLENOL) 160 MG/5ML solution 325-650 mg  325-650 mg Oral Q4H PRN Reed Breech, MD       ondansetron [facility]) injection 4 mg  4 mg Intravenous Once PRN Reed Breech, MD        Lab Results:  Results for orders placed or performed during the hospital encounter of 11/18/21 (from the past 48 hour(s))  Glucose, capillary     Status: None   Collection Time: 11/20/21  6:16 AM  Result Value Ref Range   Glucose-Capillary 94 70 - 99 mg/dL    Comment: Glucose reference range applies only to samples taken after fasting for at least 8 hours.    Blood Alcohol level:  Lab Results  Component Value Date   ETH <10 10/31/2021   ETH <10 06/30/2021    Metabolic Disorder Labs: Lab Results  Component Value Date   HGBA1C 5.5 11/04/2021   MPG 111.15 11/04/2021   MPG 114 06/17/2021   Lab Results  Component Value Date   PROLACTIN 33.2 (H) 08/15/2016   Lab Results  Component Value Date   CHOL 149 11/04/2021   TRIG 56 11/04/2021   HDL 35 (L) 11/04/2021   CHOLHDL 4.3 11/04/2021   VLDL 11 11/04/2021   LDLCALC 103 (H) 11/04/2021   LDLCALC 111 (H) 06/17/2021    Physical Findings: AIMS:  , ,  ,  ,    CIWA:    COWS:     Musculoskeletal: Strength & Muscle Tone: within normal limits Gait & Station: normal Patient leans: N/A  Psychiatric Specialty Exam:  Presentation  General Appearance: Casual; Neat  Eye Contact:Fair  Speech:Slow  Speech Volume:Decreased  Handedness:Right   Mood and Affect  Mood:Anxious; Depressed  Affect:Constricted   Thought Process  Thought Processes:Linear  Descriptions of Associations:Circumstantial  Orientation:Full (Time, Place and Person)  Thought Content:Logical  History of Schizophrenia/Schizoaffective disorder:Yes  Duration of Psychotic Symptoms:Greater than six months  Hallucinations:No data recorded Ideas of Reference:None  Suicidal Thoughts:No data recorded Homicidal Thoughts:No  data recorded  Sensorium  Memory:Immediate Fair; Remote Fair  Judgment:Fair  Insight:Fair   Executive Functions  Concentration:Fair  Attention Span:Fair  Recall:Fair  Fund of Knowledge:Fair  Language:Fair   Psychomotor Activity  Psychomotor Activity:No data recorded  Assets  Assets:Communication Skills; Desire for Improvement; Physical Health   Sleep  Sleep:No data recorded   Physical Exam: Physical Exam Vitals and  nursing note reviewed.  Constitutional:      Appearance: Normal appearance.  HENT:     Head: Normocephalic and atraumatic.     Mouth/Throat:     Pharynx: Oropharynx is clear.  Eyes:     Pupils: Pupils are equal, round, and reactive to light.  Cardiovascular:     Rate and Rhythm: Normal rate and regular rhythm.  Pulmonary:     Effort: Pulmonary effort is normal.     Breath sounds: Normal breath sounds.  Abdominal:     General: Abdomen is flat.     Palpations: Abdomen is soft.  Musculoskeletal:        General: Normal range of motion.  Skin:    General: Skin is warm and dry.  Neurological:     General: No focal deficit present.     Mental Status: He is alert. Mental status is at baseline.  Psychiatric:        Attention and Perception: Attention normal.        Mood and Affect: Mood is depressed. Affect is blunt.        Speech: Speech is delayed.        Behavior: Behavior is slowed.        Thought Content: Thought content normal.   Review of Systems  Constitutional: Negative.   HENT: Negative.    Eyes: Negative.   Respiratory: Negative.    Cardiovascular: Negative.   Gastrointestinal: Negative.   Musculoskeletal: Negative.   Skin: Negative.   Neurological: Negative.   Psychiatric/Behavioral:  Positive for depression, hallucinations and suicidal ideas.   Blood pressure 101/70, pulse 73, temperature 98.2 F (36.8 C), resp. rate 18, height 6\' 3"  (1.905 m), weight 93.4 kg, SpO2 99 %. Body mass index is 25.74 kg/m.   Treatment Plan  Summary: Plan fourth treatment today with bilateral ECT.  Hope to see some improvement over the next couple days.  If or not seeing things getting significantly better we will reassess the utility of ECT after the weekend.  For now no change to medicine  Mordecai RasmussenJohn Japneet Staggs, MD 11/20/2021, 3:46 PM

## 2021-11-20 NOTE — Procedures (Signed)
ECT SERVICES Physician's Interval Evaluation & Treatment Note  Patient Identification: JUAREZ SPAZIANI MRN:  [id number] Date of Evaluation:  11/20/2021 TX #: [id number]  MADRS:   MMSE:   P.E. Findings:  Normal physical exam  Psychiatric Interval Note:  A little more interactive with mood reported as better  Subjective:  Patient is a 26 y.o. male seen for evaluation for Electroconvulsive Therapy. Mood a little less depressed  Treatment Summary:   []   Right Unilateral              [x] Bilateral   % Energy : 1.0 ms 20%   Impedance: 1400 ohms  Seizure Energy Index: 7376 V squared  Postictal Suppression Index: 83%  Seizure Concordance Index: 96%  Medications  Pre Shock: Robinul 0.2 mg Toradol 30 mg Brevital 100 mg succinylcholine 250 mg  Post Shock: Versed 4 mg  Seizure Duration: 36 seconds EMG 42 seconds EEG   Comments: Follow-up next treatment Monday  Lungs:  [x]   Clear to auscultation               []  Other:   Heart:    [x]   Regular rhythm             []  irregular rhythm    [x]   Previous H&P reviewed, patient examined and there are NO CHANGES                 []   Previous H&P reviewed, patient examined and there are changes noted.   Alethia Berthold, MD 5/19/20231:45 PM

## 2021-11-20 NOTE — Progress Notes (Signed)
Patient isolative to his room tonight, denies SI/HI/AVH. Pt presents flat and depressed. Pt did come get snack and QHS medications. Pt given education, support, and encouragement to be active in his treatment plan. Pt being monitored Q 15 minutes for safety per unit protocol. Pt remains safe on the unit. Pt stated that ECT went well today and that he is feeling better.

## 2021-11-20 NOTE — Anesthesia Postprocedure Evaluation (Signed)
Anesthesia Post Note  Patient: Timothy [name]  Procedure(s) Performed: ECT TX  Patient location during evaluation: PACU Anesthesia Type: General Level of consciousness: awake and alert, oriented and patient cooperative Pain management: pain level controlled Vital Signs Assessment: post-procedure vital signs reviewed and stable Respiratory status: spontaneous breathing, nonlabored ventilation and respiratory function stable Cardiovascular status: blood pressure returned to baseline and stable Postop Assessment: adequate PO intake Anesthetic complications: no Comments: Tongue ecchymosis, no edema.  Recommended pt put ice near that area in mouth for swelling and to notify a doctor if edema occurs.   No notable events documented.   Last Vitals:  Vitals:   11/20/21 1249 11/20/21 1327  BP: 131/75 132/81  Pulse: 77 78  Resp: 17 16  Temp:  36.6 C  SpO2: 97% 95%    Last Pain:  Vitals:   11/20/21 1327  TempSrc: Temporal  PainSc: 0-No pain                 Reed Breech

## 2021-11-20 NOTE — Transfer of Care (Addendum)
Immediate Anesthesia Transfer of Care Note  Patient: Timothy [name]  Procedure(s) Performed: * No procedures listed *  Patient Location: PACU  Anesthesia Type:General  Level of Consciousness: sedated  Airway & Oxygen Therapy: Patient Spontanous Breathing and Patient connected to face mask oxygen  Post-op Assessment: Report given to RN and Post -op Vital signs reviewed and stable  Post vital signs: Reviewed and stable  Last Vitals:  Vitals:   11/20/21 1212 11/20/21 1239  BP: 105/72 (!) 144/75  Pulse: 73 83  Resp: 18 19  Temp: 36.8 C 36.8 C  SpO2: [id number]    Complications: No apparent anesthesia complications

## 2021-11-20 NOTE — Anesthesia Preprocedure Evaluation (Addendum)
Anesthesia Evaluation  Patient identified by MRN, date of birth, ID band Patient awake    Reviewed: Allergy & Precautions, NPO status , Patient's Chart, lab work & pertinent test results  History of Anesthesia Complications Negative for: history of anesthetic complications  Airway Mallampati: II   Neck ROM: Full    Dental no notable dental hx.    Pulmonary Current Smoker (5 cigarettes per day) and Patient abstained from smoking.,  Marijuana use   Pulmonary exam normal breath sounds clear to auscultation       Cardiovascular Exercise Tolerance: Good negative cardio ROS Normal cardiovascular exam Rhythm:Regular Rate:Normal     Neuro/Psych PSYCHIATRIC DISORDERS Depression Bipolar Disorder Schizophrenia negative neurological ROS     GI/Hepatic negative GI ROS,   Endo/Other  negative endocrine ROS  Renal/GU negative Renal ROS     Musculoskeletal   Abdominal   Peds  Hematology negative hematology ROS (+)   Anesthesia Other Findings   Reproductive/Obstetrics                            Anesthesia Physical Anesthesia Plan  ASA: 3  Anesthesia Plan: General   Post-op Pain Management:    Induction: Intravenous  PONV Risk Score and Plan: 1 and TIVA and Treatment may vary due to age or medical condition  Airway Management Planned: Natural Airway  Additional Equipment:   Intra-op Plan:   Post-operative Plan:   Informed Consent: I have reviewed the patients History and Physical, chart, labs and discussed the procedure including the risks, benefits and alternatives for the proposed anesthesia with the patient or authorized representative who has indicated his/her understanding and acceptance.       Plan Discussed with: CRNA  Anesthesia Plan Comments: (LMA/GETA backup discussed.  Patient consented for risks of anesthesia including but not limited to:  - adverse reactions to  medications - damage to eyes, teeth, lips or other oral mucosa - nerve damage due to positioning  - sore throat or hoarseness - damage to heart, brain, nerves, lungs, other parts of body or loss of life  Informed patient about role of CRNA in peri- and intra-operative care.  Patient voiced understanding.)        Anesthesia Quick Evaluation

## 2021-11-21 DIAGNOSIS — F250 Schizoaffective disorder, bipolar type: Secondary | ICD-10-CM | POA: Diagnosis not present

## 2021-11-21 NOTE — Progress Notes (Signed)
Pt was asked how he felt compared to this morning and his evaluation. He stated "little bit the same". He went the group. He was mostly isolative in his room sleeping. He has a blunt affect. Torrie Mayers RN

## 2021-11-21 NOTE — Progress Notes (Signed)
[facility] MD Progress Note  11/21/2021 2:21 PM Timothy [name]  MRN:  [id number] Subjective: Follow-up 26 year old man with schizoaffective disorder.  Undergoing ECT.  Fourth treatment on 11/20/21.   Timothy [name] is calm and cooperative, stated that Timothy [name] is feeling better, less depressed, but his affect is still flat.   Denied Si or Hi, denied AVH, or paranoia. Agreed to continue ECT.   Principal Problem: Schizoaffective disorder, bipolar type (HCC) Diagnosis: Principal Problem:   Schizoaffective disorder, bipolar type (HCC)  Total Time spent with patient: 30 minutes  Past Psychiatric History: Past history of recurrent depressive symptoms with psychosis  Past Medical History:  Past Medical History:  Diagnosis Date   Cannabis abuse    Paranoid schizophrenia (HCC)    Schizoaffective disorder, depressive type (HCC) 09/17/2014   History reviewed. No pertinent surgical history. Family History:  Family History  Problem Relation Age of Onset   Mental illness Brother    Drug abuse Brother    Mental illness Cousin    Suicidality Cousin    Diabetes Maternal Grandmother    Family Psychiatric  History: See previous Social History:  Social History   Substance and Sexual Activity  Alcohol Use Yes   Alcohol/week: 2.0 standard drinks   Types: 2 Cans of beer per week     Social History   Substance and Sexual Activity  Drug Use Yes   Types: Marijuana, MDMA Chiropodist)    Social History   Socioeconomic History   Marital status: Single    Spouse name: Not on file   Number of children: Not on file   Years of education: Not on file   Highest education level: Not on file  Occupational History   Not on file  Tobacco Use   Smoking status: Some Days    Packs/day: 0.25    Types: Cigarettes   Smokeless tobacco: Never  Vaping Use   Vaping Use: Never used  Substance and Sexual Activity   Alcohol use: Yes    Alcohol/week: 2.0 standard drinks    Types: 2 Cans of beer per week   Drug use: Yes    Types:  Marijuana, MDMA (Ecstacy)   Sexual activity: Yes  Other Topics Concern   Not on file  Social History Narrative   Not on file   Social Determinants of Health   Financial Resource Strain: Not on file  Food Insecurity: Not on file  Transportation Needs: Not on file  Physical Activity: Not on file  Stress: Not on file  Social Connections: Not on file   Additional Social History:    Sleep: Fair  Appetite:  Fair  Current Medications: Current Facility-Administered Medications  Medication Dose Route Frequency Provider Last Rate Last Admin   acetaminophen (TYLENOL) tablet 650 mg  650 mg Oral Q6H PRN Charm Rings, NP   650 mg at 11/19/21 0825   alum & mag hydroxide-simeth (MAALOX/MYLANTA) 200-200-20 MG/5ML suspension 30 mL  30 mL Oral Q4H PRN Charm Rings, NP       buPROPion (WELLBUTRIN XL) 24 hr tablet 300 mg  300 mg Oral Daily Clapacs, John T, MD   300 mg at 11/21/21 0821   fentaNYL (SUBLIMAZE) injection 25 mcg  25 mcg Intravenous Q5 min PRN Darleene Cleaver, Gijsbertus F, MD       FLUoxetine (PROZAC) capsule 20 mg  20 mg Oral Daily Charm Rings, NP   20 mg at 11/21/21 6384   ibuprofen (ADVIL) tablet 600 mg  600 mg Oral Q6H PRN  Clapacs, Jackquline Denmark, MD   600 mg at 11/19/21 0825   magnesium hydroxide (MILK OF MAGNESIA) suspension 30 mL  30 mL Oral Daily PRN Charm Rings, NP       nicotine (NICODERM CQ - dosed in mg/24 hours) patch 14 mg  14 mg Transdermal Daily Clapacs, Jackquline Denmark, MD   14 mg at 11/20/21 0814   ondansetron (ZOFRAN) injection 4 mg  4 mg Intravenous Once PRN Darleene Cleaver, Gerrit Heck, MD       QUEtiapine (SEROQUEL) tablet 300 mg  300 mg Oral QHS Clapacs, Jackquline Denmark, MD   300 mg at 11/20/21 2116    Lab Results:  Results for orders placed or performed during the hospital encounter of 11/18/21 (from the past 48 hour(s))  Glucose, capillary     Status: None   Collection Time: 11/20/21  6:16 AM  Result Value Ref Range   Glucose-Capillary 94 70 - 99 mg/dL    Comment: Glucose  reference range applies only to samples taken after fasting for at least 8 hours.    Blood Alcohol level:  Lab Results  Component Value Date   ETH <10 10/31/2021   ETH <10 06/30/2021    Metabolic Disorder Labs: Lab Results  Component Value Date   HGBA1C 5.5 11/04/2021   MPG 111.15 11/04/2021   MPG 114 06/17/2021   Lab Results  Component Value Date   PROLACTIN 33.2 (H) 08/15/2016   Lab Results  Component Value Date   CHOL 149 11/04/2021   TRIG 56 11/04/2021   HDL 35 (L) 11/04/2021   CHOLHDL 4.3 11/04/2021   VLDL 11 11/04/2021   LDLCALC 103 (H) 11/04/2021   LDLCALC 111 (H) 06/17/2021    Physical Findings: AIMS:  , ,  ,  ,    CIWA:    COWS:     Musculoskeletal: Strength & Muscle Tone: within normal limits Gait & Station: normal Patient leans: N/A  Psychiatric Specialty Exam:  Presentation  General Appearance: Casual; Neat  Eye Contact:Fair  Speech:Slow  Speech Volume:Decreased  Handedness:Right   Mood and Affect  Mood:Anxious; Depressed  Affect:Constricted   Thought Process  Thought Processes:Linear  Descriptions of Associations:Circumstantial  Orientation:Full (Time, Place and Person)  Thought Content:Logical  History of Schizophrenia/Schizoaffective disorder:Yes  Duration of Psychotic Symptoms:Greater than six months  Hallucinations:No data recorded Ideas of Reference:None  Suicidal Thoughts:No data recorded Homicidal Thoughts:No data recorded  Sensorium  Memory:Immediate Fair; Remote Fair  Judgment:Fair  Insight:Fair   Executive Functions  Concentration:Fair  Attention Span:Fair  Recall:Fair  Fund of Knowledge:Fair  Language:Fair   Psychomotor Activity  Psychomotor Activity:No data recorded  Assets  Assets:Communication Skills; Desire for Improvement; Physical Health   Sleep  Sleep:No data recorded   Physical Exam: Physical Exam Vitals and nursing note reviewed.  Constitutional:      Appearance: Normal  appearance.  HENT:     Head: Normocephalic and atraumatic.     Mouth/Throat:     Pharynx: Oropharynx is clear.  Eyes:     Pupils: Pupils are equal, round, and reactive to light.  Cardiovascular:     Rate and Rhythm: Normal rate and regular rhythm.  Pulmonary:     Effort: Pulmonary effort is normal.     Breath sounds: Normal breath sounds.  Abdominal:     General: Abdomen is flat.     Palpations: Abdomen is soft.  Musculoskeletal:        General: Normal range of motion.  Skin:    General: Skin is  warm and dry.  Neurological:     General: No focal deficit present.     Mental Status: Sawyer Kahan is alert. Mental status is at baseline.  Psychiatric:        Attention and Perception: Attention normal.        Mood and Affect: Mood is depressed. Affect is blunt.        Speech: Speech is delayed.        Behavior: Behavior is slowed.        Thought Content: Thought content normal.   Review of Systems  Constitutional: Negative.   HENT: Negative.    Eyes: Negative.   Respiratory: Negative.    Cardiovascular: Negative.   Gastrointestinal: Negative.   Musculoskeletal: Negative.   Skin: Negative.   Neurological: Negative.   Psychiatric/Behavioral:  Positive for depression, hallucinations and suicidal ideas.   Blood pressure 121/73, pulse 78, temperature 98 F (36.7 C), temperature source Oral, resp. rate 18, height 6\' 3"  (1.905 m), weight 93.4 kg, SpO2 98 %. Body mass index is 25.74 kg/m.   Treatment Plan Summary: Daily contact with patient to assess and evaluate symptoms and progress in treatment and Medication management  Schizoaffective, depressive type -- continue wellbutrin 300mg  daily.  -- continue Prozac 20mg  daiy.  -- continue Seroquel 300mg  qhs.  -- so far Keniesha Adderly has had ECT 4x, primary team place to reassess and adjust treatment accordingly.  Subjectively, pt reports feeling less depressed today.   Keirah Konitzer, MD 11/21/2021, 2:21 PM

## 2021-11-21 NOTE — Plan of Care (Signed)
Pt rates depression 6/10, hopelessness 4/10 and anxiety 7/10. Pt has passive SI with no plan. Pt made a verbal agreement to not harm himself. Pt was educated on care plan and verbalizes understanding. Torrie Mayers RN Problem: Education: Goal: Charity fundraiser Education information/materials will improve Outcome: Progressing Goal: Emotional status will improve Outcome: Progressing Goal: Mental status will improve Description: Patient will became more alert no confusion to place and situation  Outcome: Progressing Goal: Verbalization of understanding the information provided will improve Outcome: Progressing   Problem: Activity: Goal: Interest or engagement in activities will improve Outcome: Progressing Goal: Sleeping patterns will improve Outcome: Progressing   Problem: Coping: Goal: Ability to verbalize frustrations and anger appropriately will improve Description: Patient will verbalize frustration and anger to the staff appropriately.  Outcome: Progressing Goal: Ability to demonstrate self-control will improve Outcome: Progressing   Problem: Health Behavior/Discharge Planning: Goal: Identification of resources available to assist in meeting health care needs will improve Outcome: Progressing Goal: Compliance with treatment plan for underlying cause of condition will improve Outcome: Progressing   Problem: Physical Regulation: Goal: Ability to maintain clinical measurements within normal limits will improve Outcome: Progressing   Problem: Safety: Goal: Periods of time without injury will increase Outcome: Progressing   Problem: Activity: Goal: Will verbalize the importance of balancing activity with adequate rest periods Outcome: Progressing   Problem: Education: Goal: Will be free of psychotic symptoms Description: Patient will be free of psychosis  Outcome: Progressing Goal: Knowledge of the prescribed therapeutic regimen will improve Outcome:  Progressing

## 2021-11-21 NOTE — Progress Notes (Signed)
Patient more active on the unit, denies SI/HI/AVH. Pt presents flat and depressed. Pt did come get snack and QHS medications. Pt given education, support, and encouragement to be active in his treatment plan. Pt being monitored Q 15 minutes for safety per unit protocol. Pt remains safe on the unit. Pt stated that ECT went well today and that he is feeling better.

## 2021-11-22 ENCOUNTER — Other Ambulatory Visit: Payer: Self-pay | Admitting: Psychiatry

## 2021-11-22 DIAGNOSIS — F250 Schizoaffective disorder, bipolar type: Secondary | ICD-10-CM | POA: Diagnosis not present

## 2021-11-22 NOTE — Progress Notes (Signed)
Patient is down in the dayroom, watching television, without any issues. Patient remains safe on the unit.

## 2021-11-22 NOTE — Group Note (Signed)
LCSW Group Therapy Note  Group Date: 11/21/2021 Start Time: 1430 End Time: 1530   Type of Therapy and Topic:  Group Therapy - Healthy vs Unhealthy Coping Skills  Participation Level:  Minimal   Description of Group The focus of this group was to determine what unhealthy coping techniques typically are used by group members and what healthy coping techniques would be helpful in coping with various problems. Patients were guided in becoming aware of the differences between healthy and unhealthy coping techniques. Patients were asked to identify 2-3 healthy coping skills they would like to learn to use more effectively.  Therapeutic Goals Patients learned that coping is what human beings do all day long to deal with various situations in their lives Patients defined and discussed healthy vs unhealthy coping techniques Patients identified their preferred coping techniques and identified whether these were healthy or unhealthy Patients determined 2-3 healthy coping skills they would like to become more familiar with and use more often. Patients provided support and ideas to each other   Summary of Patient Progress: Patient was present for the duration of group and participated in the icebreaker activity. Patient presented as reserved and shared when encouraged. Patient identified making friends as a coping skill he would like to use in the future. Patient discussed discharge planning with CSW after group, stating that he would like to go to [place] and likely stay at a shelter while he looks for employment at a [employer]. Patient requested help looking for jobs. CSW encouraged patient to utilize the job search and case management resources available at the [facility] once he is discharged.    Therapeutic Modalities Cognitive Behavioral Therapy Motivational Interviewing  Berniece Salines, [place] 11/22/2021  8:30 AM

## 2021-11-22 NOTE — Progress Notes (Signed)
Patient is going outside, to the courtyard, with staff and other members on the unit to get some fresh air.

## 2021-11-22 NOTE — Plan of Care (Signed)
Pt isolating to room, only out of room for his snack. He denies SI/HI and AVH. Pt affect sad and blunt. He doesn't want to talk. He denies anxiety or depression, but he likes sad. Pt informed he could not have anything to eat or drink after 12 might night. He is not visible on the unit and no interaction with peers or staff. He contracts for safety. Problem: Education: Goal: Knowledge of [facility] Education information/materials will improve Outcome: Progressing Goal: Emotional status will improve Outcome: Progressing Goal: Mental status will improve Description: Patient will became more alert no confusion to place and situation  Outcome: Progressing Goal: Verbalization of understanding the information provided will improve Outcome: Progressing   Problem: Activity: Goal: Interest or engagement in activities will improve Outcome: Progressing Goal: Sleeping patterns will improve Outcome: Progressing   Problem: Coping: Goal: Ability to verbalize frustrations and anger appropriately will improve Description: Patient will verbalize frustration and anger to the staff appropriately.  Outcome: Progressing Goal: Ability to demonstrate self-control will improve Outcome: Progressing   Problem: Health Behavior/Discharge Planning: Goal: Identification of resources available to assist in meeting health care needs will improve Outcome: Progressing Goal: Compliance with treatment plan for underlying cause of condition will improve Outcome: Progressing   Problem: Physical Regulation: Goal: Ability to maintain clinical measurements within normal limits will improve Outcome: Progressing   Problem: Safety: Goal: Periods of time without injury will increase Outcome: Progressing   Problem: Activity: Goal: Will verbalize the importance of balancing activity with adequate rest periods Outcome: Progressing   Problem: Education: Goal: Will be free of psychotic symptoms Description:  Patient will be free of psychosis  Outcome: Progressing Goal: Knowledge of the prescribed therapeutic regimen will improve Outcome: Progressing   Problem: Coping: Goal: Coping ability will improve Outcome: Progressing Goal: Will verbalize feelings Outcome: Progressing   Problem: Health Behavior/Discharge Planning: Goal: Compliance with prescribed medication regimen will improve Outcome: Progressing   Problem: Nutritional: Goal: Ability to achieve adequate nutritional intake will improve Outcome: Progressing   Problem: Role Relationship: Goal: Ability to communicate needs accurately will improve Outcome: Progressing Goal: Ability to interact with others will improve Outcome: Progressing   Problem: Safety: Goal: Ability to redirect hostility and anger into socially appropriate behaviors will improve Outcome: Progressing Goal: Ability to remain free from injury will improve Outcome: Progressing   Problem: Self-Care: Goal: Ability to participate in self-care as condition permits will improve Outcome: Progressing   Problem: Self-Concept: Goal: Will verbalize positive feelings about self Outcome: Progressing

## 2021-11-22 NOTE — Progress Notes (Signed)
[facility]  11/22/2021 2:01 PM Timothy [name]  MRN:  [id number] Subjective: Follow-up 26 year old man with schizoaffective disorder.  Undergoing ECT.  Fourth treatment on 11/20/21.   Timothy [name] reports feeling ok, but still quite withdrawn.  Tolerates ECT and meds.   Denied Si or Hi, denied AVH, or paranoia. Agreed to continue ECT.   Principal Problem: Schizoaffective disorder, bipolar type (HCC) Diagnosis: Principal Problem:   Schizoaffective disorder, bipolar type (HCC)  Total Time spent with patient: 30 minutes  Past Psychiatric History: Past history of recurrent depressive symptoms with psychosis  Past Medical History:  Past Medical History:  Diagnosis Date   Cannabis abuse    Paranoid schizophrenia (HCC)    Schizoaffective disorder, depressive type (HCC) 09/17/2014   History reviewed. No pertinent surgical history. Family History:  Family History  Problem Relation Age of Onset   Mental illness Brother    Drug abuse Brother    Mental illness Cousin    Suicidality Cousin    Diabetes Maternal Grandmother    Family Psychiatric  History: See previous Social History:  Social History   Substance and Sexual Activity  Alcohol Use Yes   Alcohol/week: 2.0 standard drinks   Types: 2 Cans of beer per week     Social History   Substance and Sexual Activity  Drug Use Yes   Types: Marijuana, MDMA Chiropodist(Ecstacy)    Social History   Socioeconomic History   Marital status: Single    Spouse name: Not on file   Number of children: Not on file   Years of education: Not on file   Highest education level: Not on file  Occupational History   Not on file  Tobacco Use   Smoking status: Some Days    Packs/day: 0.25    Types: Cigarettes   Smokeless tobacco: Never  Vaping Use   Vaping Use: Never used  Substance and Sexual Activity   Alcohol use: Yes    Alcohol/week: 2.0 standard drinks    Types: 2 Cans of beer per week   Drug use: Yes    Types: Marijuana, MDMA (Ecstacy)    Sexual activity: Yes  Other Topics Concern   Not on file  Social History Narrative   Not on file   Social Determinants of Health   Financial Resource Strain: Not on file  Food Insecurity: Not on file  Transportation Needs: Not on file  Physical Activity: Not on file  Stress: Not on file  Social Connections: Not on file   Additional Social History:    Sleep: Fair  Appetite:  Fair  Current Medications: Current Facility-Administered Medications  Medication Dose Route Frequency Provider Last Rate Last Admin   acetaminophen (TYLENOL) tablet 650 mg  650 mg Oral Q6H PRN Charm RingsLord, Jamison Y, NP   650 mg at 11/19/21 0825   alum & mag hydroxide-simeth (MAALOX/MYLANTA) 200-200-20 MG/5ML suspension 30 mL  30 mL Oral Q4H PRN Charm RingsLord, Jamison Y, NP       buPROPion (WELLBUTRIN XL) 24 hr tablet 300 mg  300 mg Oral Daily Clapacs, John T, MD   300 mg at 11/22/21 0847   fentaNYL (SUBLIMAZE) injection 25 mcg  25 mcg Intravenous Q5 min PRN Darleene CleaverVan Staveren, Gijsbertus F, MD       FLUoxetine (PROZAC) capsule 20 mg  20 mg Oral Daily Charm RingsLord, Jamison Y, NP   20 mg at 11/22/21 0847   ibuprofen (ADVIL) tablet 600 mg  600 mg Oral Q6H PRN Clapacs, Jackquline DenmarkJohn T, MD  600 mg at 11/19/21 0825   magnesium hydroxide (MILK OF MAGNESIA) suspension 30 mL  30 mL Oral Daily PRN Charm Rings, NP       nicotine (NICODERM CQ - dosed in mg/24 hours) patch 14 mg  14 mg Transdermal Daily Clapacs, John T, MD   14 mg at 11/22/21 0847   ondansetron (ZOFRAN) injection 4 mg  4 mg Intravenous Once PRN Darleene Cleaver, Gerrit Heck, MD       QUEtiapine (SEROQUEL) tablet 300 mg  300 mg Oral QHS Clapacs, Jackquline Denmark, MD   300 mg at 11/21/21 2113    Lab Results:  No results found for this or any previous visit (from the past 48 hour(s)).   Blood Alcohol level:  Lab Results  Component Value Date   ETH <10 10/31/2021   ETH <10 06/30/2021    Metabolic Disorder Labs: Lab Results  Component Value Date   HGBA1C 5.5 11/04/2021   MPG 111.15 11/04/2021    MPG 114 06/17/2021   Lab Results  Component Value Date   PROLACTIN 33.2 (H) 08/15/2016   Lab Results  Component Value Date   CHOL 149 11/04/2021   TRIG 56 11/04/2021   HDL 35 (L) 11/04/2021   CHOLHDL 4.3 11/04/2021   VLDL 11 11/04/2021   LDLCALC 103 (H) 11/04/2021   LDLCALC 111 (H) 06/17/2021    Physical Findings: AIMS:  , ,  ,  ,    CIWA:    COWS:     Musculoskeletal: Strength & Muscle Tone: within normal limits Gait & Station: normal Patient leans: N/A  Psychiatric Specialty Exam:  Presentation  General Appearance: Casual; Neat  Eye Contact:Fair  Speech:Slow  Speech Volume:Decreased  Handedness:Right   Mood and Affect  Mood:Anxious; Depressed  Affect:Constricted   Thought Process  Thought Processes:Linear  Descriptions of Associations:Circumstantial  Orientation:Full (Time, Place and Person)  Thought Content:Logical  History of Schizophrenia/Schizoaffective disorder:Yes  Duration of Psychotic Symptoms:Greater than six months  Hallucinations:No data recorded Ideas of Reference:None  Suicidal Thoughts:No data recorded Homicidal Thoughts:No data recorded  Sensorium  Memory:Immediate Fair; Remote Fair  Judgment:Fair  Insight:Fair   Executive Functions  Concentration:Fair  Attention Span:Fair  Recall:Fair  Fund of Knowledge:Fair  Language:Fair   Psychomotor Activity  Psychomotor Activity:No data recorded  Assets  Assets:Communication Skills; Desire for Improvement; Physical Health   Sleep  Sleep:No data recorded   Physical Exam: Physical Exam Vitals and nursing note reviewed.  Constitutional:      Appearance: Normal appearance.  HENT:     Head: Normocephalic and atraumatic.     Mouth/Throat:     Pharynx: Oropharynx is clear.  Eyes:     Pupils: Pupils are equal, round, and reactive to light.  Cardiovascular:     Rate and Rhythm: Normal rate and regular rhythm.  Pulmonary:     Effort: Pulmonary effort is  normal.     Breath sounds: Normal breath sounds.  Abdominal:     General: Abdomen is flat.     Palpations: Abdomen is soft.  Musculoskeletal:        General: Normal range of motion.  Skin:    General: Skin is warm and dry.  Neurological:     General: No focal deficit present.     Mental Status: Timothy [name] is alert. Mental status is at baseline.  Psychiatric:        Attention and Perception: Attention normal.        Mood and Affect: Mood is depressed. Affect is blunt.  Speech: Speech is delayed.        Behavior: Behavior is slowed.        Thought Content: Thought content normal.   Review of Systems  Constitutional: Negative.   HENT: Negative.    Eyes: Negative.   Respiratory: Negative.    Cardiovascular: Negative.   Gastrointestinal: Negative.   Musculoskeletal: Negative.   Skin: Negative.   Neurological: Negative.   Psychiatric/Behavioral:  Positive for depression, hallucinations and suicidal ideas.   Blood pressure 108/74, pulse 83, temperature 97.8 F (36.6 C), temperature source Oral, resp. rate 18, height 6\' 3"  (1.905 m), weight 93.4 kg, SpO2 100 %. Body mass index is 25.74 kg/m.   Treatment Plan Summary: Daily contact with patient to assess and evaluate symptoms and progress in treatment and Medication management  Schizoaffective, depressive type -- continue wellbutrin 300mg  daily.  -- continue Prozac 20mg  daiy.  -- continue Seroquel 300mg  qhs.  -- so far Timothy [name] has had ECT 4x, primary team place to reassess and adjust treatment accordingly.  Subjectively, pt reports feeling less depressed today.   Timothy Zaremba, MD 11/22/2021, 2:01 PM

## 2021-11-22 NOTE — BH IP Treatment Plan (Signed)
Interdisciplinary Treatment and Diagnostic Plan Update  11/22/2021 Time of Session: 8:45AM Timothy [name] MRN: [id number]  Principal Diagnosis: Schizoaffective disorder, bipolar type ([place])  Secondary Diagnoses: Principal Problem:   Schizoaffective disorder, bipolar type ([place])   Current Medications:  Current Facility-Administered Medications  Medication Dose Route Frequency Provider Last Rate Last Admin   acetaminophen (TYLENOL) tablet 650 mg  650 mg Oral Q6H PRN Patrecia Pour, NP   650 mg at 11/19/21 0825   alum & mag hydroxide-simeth (MAALOX/MYLANTA) 200-200-20 MG/5ML suspension 30 mL  30 mL Oral Q4H PRN Patrecia Pour, NP       buPROPion (WELLBUTRIN XL) 24 hr tablet 300 mg  300 mg Oral Daily Clapacs, John T, MD   300 mg at 11/22/21 0847   fentaNYL (SUBLIMAZE) injection 25 mcg  25 mcg Intravenous Q5 min PRN [place] Service, Jane Canary, MD       FLUoxetine (PROZAC) capsule 20 mg  20 mg Oral Daily Patrecia Pour, NP   20 mg at 11/22/21 0847   ibuprofen (ADVIL) tablet 600 mg  600 mg Oral Q6H PRN Clapacs, Madie Reno, MD   600 mg at 11/19/21 0825   magnesium hydroxide (MILK OF MAGNESIA) suspension 30 mL  30 mL Oral Daily PRN Patrecia Pour, NP       nicotine (NICODERM CQ - dosed in mg/24 hours) patch 14 mg  14 mg Transdermal Daily Clapacs, Madie Reno, MD   14 mg at 11/22/21 0847   ondansetron (ZOFRAN) injection 4 mg  4 mg Intravenous Once PRN [place] Service, Jane Canary, MD       QUEtiapine (SEROQUEL) tablet 300 mg  300 mg Oral QHS Clapacs, John T, MD   300 mg at 11/21/21 2113   PTA Medications: Medications Prior to Admission  Medication Sig Dispense Refill Last Dose   divalproex (DEPAKOTE) 500 MG DR tablet Take 1 tablet (500 mg total) by mouth every 12 (twelve) hours. 60 tablet 1 11/12/2021   FLUoxetine (PROZAC) 20 MG capsule Take 1 capsule (20 mg total) by mouth daily. (Patient not taking: Reported on 07/02/2021) 30 capsule 1    QUEtiapine (SEROQUEL) 200 MG tablet Take 1 tablet (200 mg total)  by mouth at bedtime. (Patient not taking: Reported on 07/02/2021) 30 tablet 0    QUEtiapine (SEROQUEL) 50 MG tablet Take 1 tablet (50 mg total) by mouth 2 (two) times daily. (Patient not taking: Reported on 07/02/2021) 60 tablet 0     Patient Stressors: Medication change or noncompliance   Substance abuse    Patient Strengths: Ability for insight  Motivation for treatment/growth   Treatment Modalities: Medication Management, Group therapy, Case management,  1 to 1 session with clinician, Psychoeducation, Recreational therapy.   Physician Treatment Plan for Primary Diagnosis: Schizoaffective disorder, bipolar type ([place]) Long Term Goal(s): Improvement in symptoms so as ready for discharge   Short Term Goals: Ability to identify changes in lifestyle to reduce recurrence of condition will improve Ability to verbalize feelings will improve Ability to disclose and discuss suicidal ideas Ability to demonstrate self-control will improve Ability to identify and develop effective coping behaviors will improve Ability to maintain clinical measurements within normal limits will improve Compliance with prescribed medications will improve Ability to identify triggers associated with substance abuse/mental health issues will improve  Medication Management: Evaluate patient's response, side effects, and tolerance of medication regimen.  Therapeutic Interventions: 1 to 1 sessions, Unit Group sessions and Medication administration.  Evaluation of Outcomes: Progressing  Physician Treatment Plan  for Secondary Diagnosis: Principal Problem:   Schizoaffective disorder, bipolar type ([place])  Long Term Goal(s): Improvement in symptoms so as ready for discharge   Short Term Goals: Ability to identify changes in lifestyle to reduce recurrence of condition will improve Ability to verbalize feelings will improve Ability to disclose and discuss suicidal ideas Ability to demonstrate self-control will  improve Ability to identify and develop effective coping behaviors will improve Ability to maintain clinical measurements within normal limits will improve Compliance with prescribed medications will improve Ability to identify triggers associated with substance abuse/mental health issues will improve     Medication Management: Evaluate patient's response, side effects, and tolerance of medication regimen.  Therapeutic Interventions: 1 to 1 sessions, Unit Group sessions and Medication administration.  Evaluation of Outcomes: Progressing   RN Treatment Plan for Primary Diagnosis: Schizoaffective disorder, bipolar type ([place]) Long Term Goal(s): Knowledge of disease and therapeutic regimen to maintain health will improve  Short Term Goals: Ability to remain free from injury will improve, Ability to verbalize frustration and anger appropriately will improve, Ability to demonstrate self-control, Ability to participate in decision making will improve, Ability to verbalize feelings will improve, Ability to disclose and discuss suicidal ideas, Ability to identify and develop effective coping behaviors will improve, and Compliance with prescribed medications will improve  Medication Management: RN will administer medications as ordered by provider, will assess and evaluate patient's response and provide education to patient for prescribed medication. RN will report any adverse and/or side effects to prescribing provider.  Therapeutic Interventions: 1 on 1 counseling sessions, Psychoeducation, Medication administration, Evaluate responses to treatment, Monitor vital signs and CBGs as ordered, Perform/monitor CIWA, COWS, AIMS and Fall Risk screenings as ordered, Perform wound care treatments as ordered.  Evaluation of Outcomes: Progressing   LCSW Treatment Plan for Primary Diagnosis: Schizoaffective disorder, bipolar type ([place]) Long Term Goal(s): Safe transition to appropriate next level of care at  discharge, Engage patient in therapeutic group addressing interpersonal concerns.  Short Term Goals: Engage patient in aftercare planning with referrals and resources, Increase social support, Increase ability to appropriately verbalize feelings, Increase emotional regulation, Facilitate acceptance of mental health diagnosis and concerns, Facilitate patient progression through stages of change regarding substance use diagnoses and concerns, Identify triggers associated with mental health/substance abuse issues, and Increase skills for wellness and recovery  Therapeutic Interventions: Assess for all discharge needs, 1 to 1 time with Social worker, Explore available resources and support systems, Assess for adequacy in community support network, Educate family and significant other(s) on suicide prevention, Complete Psychosocial Assessment, Interpersonal group therapy.  Evaluation of Outcomes: Progressing   Progress in Treatment: Attending groups: No. Participating in groups: No. Taking medication as prescribed: Yes. Toleration medication: Yes. Family/Significant other contact made: No, will contact:  Patient declined consent for CSW to speak with family/friend. SPE completed with patient. Patient understands diagnosis: Yes. Discussing patient identified problems/goals with staff: Yes. Medical problems stabilized or resolved: Yes. Denies suicidal/homicidal ideation: No. Patient reports passive SI with no plan 11/21/21.  Issues/concerns per patient self-inventory: No. Other: none.  New problem(s) identified: No, Describe:  none identified.  New Short Term/Long Term Goal(s): Patient to work towards elimination of symptoms of psychosis, medication management for mood stabilization; elimination of SI thoughts; development of comprehensive mental wellness plan.Update 11/22/2021: No changes at this time.   Patient Goals:  No additional goals identified at this time. Patient to continue to work  towards original goals identified in initial treatment team meeting. CSW will remain available  to patient should they voice additional treatment goals. Update 11/22/2021: No changes at this time.   Discharge Plan or Barriers: Patient continues to lack stable housing. Patient likely to discharge to shelter once psychiatrically stable. Update 11/22/2021: No changes at this time.   Reason for Continuation of Hospitalization: Depression Hallucinations Medication stabilization Suicidal ideation  Estimated Length of Stay:  Last 3 [place] Suicide Severity Risk Score: Flowsheet Row ECT Treatment from 11/20/2021 in [place] ECT Treatment from 11/18/2021 in [place] Admission (Current) from 11/01/2021 in [place] Error: Question 2 not populated No Risk No Risk       Last PHQ 2/9 Scores:    12/31/2019   10:13 AM  Depression screen PHQ 2/9  Decreased Interest 2  Down, Depressed, Hopeless 1  PHQ - 2 Score 3  Altered sleeping 3  Tired, decreased energy 3  Trouble concentrating 1  Moving slowly or fidgety/restless 2  Suicidal thoughts 2  PHQ-9 Score 14  Difficult doing work/chores Very difficult    Scribe for Treatment Team: Berniece Salines, Latanya Presser 11/22/2021 8:50 AM

## 2021-11-22 NOTE — Plan of Care (Signed)
D- Patient alert and oriented. Patient presented in a pleasant mood on assessment reporting that he slept "fair" last night and had no complaints to voice to this Clinical research associate. Patient endorsed depression, anxiety, and hopelessness on his self-inventory. When this writer asked patient if he knew why he was feeling this way, he stated "not right now". Patient denied SI, HI, AVH, and pain at this time. Patient's goal for today is to "finish treatment".  A- Scheduled medications administered to patient, per MD orders. Support and encouragement provided.  Routine safety checks conducted every 15 minutes.  Patient informed to notify staff with problems or concerns.  R- No adverse drug reactions noted. Patient contracts for safety at this time. Patient compliant with medications and treatment plan. Patient receptive, calm, and cooperative. Patient interacts well with others on the unit.  Patient remains safe at this time.  Problem: Education: Goal: Knowledge of [facility] Education information/materials will improve Outcome: Progressing Goal: Emotional status will improve Outcome: Progressing Goal: Mental status will improve Description: Patient will became more alert no confusion to place and situation  Outcome: Progressing Goal: Verbalization of understanding the information provided will improve Outcome: Progressing   Problem: Activity: Goal: Interest or engagement in activities will improve Outcome: Progressing Goal: Sleeping patterns will improve Outcome: Progressing   Problem: Coping: Goal: Ability to verbalize frustrations and anger appropriately will improve Description: Patient will verbalize frustration and anger to the staff appropriately.  Outcome: Progressing Goal: Ability to demonstrate self-control will improve Outcome: Progressing   Problem: Health Behavior/Discharge Planning: Goal: Identification of resources available to assist in meeting health care needs will  improve Outcome: Progressing Goal: Compliance with treatment plan for underlying cause of condition will improve Outcome: Progressing   Problem: Physical Regulation: Goal: Ability to maintain clinical measurements within normal limits will improve Outcome: Progressing   Problem: Safety: Goal: Periods of time without injury will increase Outcome: Progressing   Problem: Activity: Goal: Will verbalize the importance of balancing activity with adequate rest periods Outcome: Progressing   Problem: Education: Goal: Will be free of psychotic symptoms Description: Patient will be free of psychosis  Outcome: Progressing Goal: Knowledge of the prescribed therapeutic regimen will improve Outcome: Progressing   Problem: Coping: Goal: Coping ability will improve Outcome: Progressing Goal: Will verbalize feelings Outcome: Progressing   Problem: Health Behavior/Discharge Planning: Goal: Compliance with prescribed medication regimen will improve Outcome: Progressing   Problem: Nutritional: Goal: Ability to achieve adequate nutritional intake will improve Outcome: Progressing   Problem: Role Relationship: Goal: Ability to communicate needs accurately will improve Outcome: Progressing Goal: Ability to interact with others will improve Outcome: Progressing   Problem: Safety: Goal: Ability to redirect hostility and anger into socially appropriate behaviors will improve Outcome: Progressing Goal: Ability to remain free from injury will improve Outcome: Progressing   Problem: Self-Care: Goal: Ability to participate in self-care as condition permits will improve Outcome: Progressing   Problem: Self-Concept: Goal: Will verbalize positive feelings about self Outcome: Progressing

## 2021-11-23 ENCOUNTER — Inpatient Hospital Stay: Payer: 59 | Admitting: Certified Registered"

## 2021-11-23 ENCOUNTER — Encounter: Payer: Self-pay | Admitting: Psychiatry

## 2021-11-23 ENCOUNTER — Ambulatory Visit: Payer: 59

## 2021-11-23 DIAGNOSIS — F250 Schizoaffective disorder, bipolar type: Secondary | ICD-10-CM | POA: Diagnosis not present

## 2021-11-23 LAB — GLUCOSE, CAPILLARY: Glucose-Capillary: 84 mg/dL (ref 70–99)

## 2021-11-23 MED ORDER — SODIUM CHLORIDE 0.9 % IV SOLN
500.0000 mL | Freq: Once | INTRAVENOUS | Status: AC
  Administered 2021-11-23: 500 mL via INTRAVENOUS

## 2021-11-23 MED ORDER — GLYCOPYRROLATE 0.2 MG/ML IJ SOLN: 0.2000 mg | Freq: Once | INTRAMUSCULAR | Status: AC

## 2021-11-23 MED ORDER — METHOHEXITAL SODIUM 100 MG/10ML IV SOSY
PREFILLED_SYRINGE | INTRAVENOUS | Status: DC | PRN
  Administered 2021-11-23: 100 mg via INTRAVENOUS

## 2021-11-23 MED ORDER — SUCCINYLCHOLINE CHLORIDE 200 MG/10ML IV SOSY
PREFILLED_SYRINGE | INTRAVENOUS | Status: DC | PRN
  Administered 2021-11-23: 250 mg via INTRAVENOUS

## 2021-11-23 MED ORDER — MIDAZOLAM HCL 2 MG/2ML IJ SOLN
INTRAMUSCULAR | Status: AC
  Filled 2021-11-23: qty 2

## 2021-11-23 MED ORDER — KETOROLAC TROMETHAMINE 30 MG/ML IJ SOLN: 30.0000 mg | Freq: Once | INTRAMUSCULAR | Status: AC

## 2021-11-23 MED ORDER — MIDAZOLAM HCL 2 MG/2ML IJ SOLN: 4.0000 mg | Freq: Once | INTRAMUSCULAR | Status: DC

## 2021-11-23 MED ORDER — SODIUM CHLORIDE 0.9 % IV SOLN: INTRAVENOUS | Status: DC | PRN

## 2021-11-23 MED ORDER — MIDAZOLAM HCL 2 MG/2ML IJ SOLN
INTRAMUSCULAR | Status: DC | PRN
  Administered 2021-11-23: 4 mg via INTRAVENOUS

## 2021-11-23 MED ORDER — KETOROLAC TROMETHAMINE 30 MG/ML IJ SOLN
INTRAMUSCULAR | Status: AC
  Administered 2021-11-23: 30 mg via INTRAVENOUS
  Filled 2021-11-23: qty 1

## 2021-11-23 MED ORDER — GLYCOPYRROLATE 0.2 MG/ML IJ SOLN
INTRAMUSCULAR | Status: AC
  Administered 2021-11-23: 0.2 mg via INTRAVENOUS
  Filled 2021-11-23: qty 1

## 2021-11-23 NOTE — Group Note (Signed)
[facility] LCSW Group Therapy Note    Group Date: 11/23/2021 Start Time: 1300 End Time: 1400  Type of Therapy and Topic:  Group Therapy:  Overcoming Obstacles  Participation Level:  [facility] PARTICIPATION LEVEL: Did Not Attend  Mood:  Description of Group:   In this group patients will be encouraged to explore what they see as obstacles to their own wellness and recovery. They will be guided to discuss their thoughts, feelings, and behaviors related to these obstacles. The group will process together ways to cope with barriers, with attention given to specific choices patients can make. Each patient will be challenged to identify changes they are motivated to make in order to overcome their obstacles. This group will be process-oriented, with patients participating in exploration of their own experiences as well as giving and receiving support and challenge from other group members.  Therapeutic Goals: 1. Patient will identify personal and current obstacles as they relate to admission. 2. Patient will identify barriers that currently interfere with their wellness or overcoming obstacles.  3. Patient will identify feelings, thought process and behaviors related to these barriers. 4. Patient will identify two changes they are willing to make to overcome these obstacles:    Summary of Patient Progress   X   Therapeutic Modalities:   Cognitive Behavioral Therapy Solution Focused Therapy Motivational Interviewing Relapse Prevention Therapy   Shalan Neault J Palmer Fahrner, LCSW 

## 2021-11-23 NOTE — Anesthesia Preprocedure Evaluation (Signed)
Anesthesia Evaluation  Patient identified by MRN, date of birth, ID band Patient awake    Reviewed: Allergy & Precautions, NPO status , Patient's Chart, lab work & pertinent test results  Airway Mallampati: I  TM Distance: >3 FB Neck ROM: Full    Dental  (+) Teeth Intact   Pulmonary neg pulmonary ROS, Current Smoker,    Pulmonary exam normal breath sounds clear to auscultation       Cardiovascular Exercise Tolerance: Good negative cardio ROS Normal cardiovascular exam Rhythm:Regular     Neuro/Psych negative neurological ROS  negative psych ROS   GI/Hepatic negative GI ROS, Neg liver ROS,   Endo/Other  negative endocrine ROS  Renal/GU negative Renal ROS  negative genitourinary   Musculoskeletal   Abdominal Normal abdominal exam  (+)   Peds negative pediatric ROS (+)  Hematology negative hematology ROS (+)   Anesthesia Other Findings Past Medical History: No date: Cannabis abuse No date: Paranoid schizophrenia ([place]) 09/17/2014: Schizoaffective disorder, depressive type ([place])  History reviewed. No pertinent surgical history.  BMI    Body Mass Index: 25.74 kg/m      Reproductive/Obstetrics negative OB ROS                             Anesthesia Physical Anesthesia Plan  ASA: 2  Anesthesia Plan: General   Post-op Pain Management:    Induction: Intravenous  PONV Risk Score and Plan: Propofol infusion and TIVA  Airway Management Planned: Natural Airway and Mask  Additional Equipment:   Intra-op Plan:   Post-operative Plan:   Informed Consent: I have reviewed the patients History and Physical, chart, labs and discussed the procedure including the risks, benefits and alternatives for the proposed anesthesia with the patient or authorized representative who has indicated his/her understanding and acceptance.     Dental Advisory Given  Plan Discussed with: CRNA and  Surgeon  Anesthesia Plan Comments:         Anesthesia Quick Evaluation

## 2021-11-23 NOTE — Anesthesia Postprocedure Evaluation (Signed)
Anesthesia Post Note  Patient: Timothy [name]  Procedure(s) Performed: ECT TX  Patient location during evaluation: PACU Anesthesia Type: General Level of consciousness: awake and oriented Pain management: pain level controlled Vital Signs Assessment: post-procedure vital signs reviewed and stable Respiratory status: spontaneous breathing and respiratory function stable Cardiovascular status: stable Anesthetic complications: no   No notable events documented.   Last Vitals:  Vitals:   11/23/21 1430 11/23/21 1433  BP: 128/66   Pulse: 74 70  Resp: 16 17  Temp:    SpO2: 99% 100%    Last Pain:  Vitals:   11/23/21 1430  TempSrc:   PainSc: Asleep                 VAN STAVEREN,Jerae Izard

## 2021-11-23 NOTE — Procedures (Signed)
ECT SERVICES Physician's Interval Evaluation & Treatment Note  Patient Identification: Timothy [name] MRN:  [id number] Date of Evaluation:  11/23/2021 TX #: 5  MADRS:   MMSE:   P.E. Findings:  No change to physical exam  Psychiatric Interval Note:  More upbeat and more alert  Subjective:  Patient is a 26 y.o. male seen for evaluation for Electroconvulsive Therapy. Admits to feeling better with fewer hallucinations  Treatment Summary:   []   Right Unilateral             [x]  Bilateral   % Energy : 1.0 ms 20%   Impedance: 1890 ohms  Seizure Energy Index: 9662 V squared  Postictal Suppression Index: 50%  Seizure Concordance Index: 18%  Medications  Pre Shock: Robinul 0.2 mg Toradol 30 mg Brevital 100 mg succinylcholine 250 mg  Post Shock: Versed 4 mg  Seizure Duration: 32 seconds EMG 36 seconds EEG   Comments: Next treatment Wednesday increased to 40%  Lungs:  [x]   Clear to auscultation               []  Other:   Heart:    [x]   Regular rhythm             []  irregular rhythm    [x]   Previous H&P reviewed, patient examined and there are NO CHANGES                 []   Previous H&P reviewed, patient examined and there are changes noted.   , MD 5/22/20232:44 PM

## 2021-11-23 NOTE — Plan of Care (Signed)
Patient tolerated ECT. Patient verbalized that ECT helping with his depression. Denies SI,HI and AVH. Encouraged for shower. Appetite good. Support and encouragement given.

## 2021-11-23 NOTE — Plan of Care (Signed)
  Problem: Education: ?Goal: Knowledge of [facility] Education information/materials will improve ?Outcome: Progressing ?Goal: Emotional status will improve ?Outcome: Progressing ?Goal: Mental status will improve ?Description: Patient will became more alert no confusion to place and situation  ?Outcome: Progressing ?Goal: Verbalization of understanding the information provided will improve ?Outcome: Progressing ?  ?Problem: Activity: ?Goal: Interest or engagement in activities will improve ?Outcome: Progressing ?Goal: Sleeping patterns will improve ?Outcome: Progressing ?  ?Problem: Coping: ?Goal: Ability to verbalize frustrations and anger appropriately will improve ?Description: Patient will verbalize frustration and anger to the staff appropriately.  ?Outcome: Progressing ?Goal: Ability to demonstrate self-control will improve ?Outcome: Progressing ?  ?Problem: Health Behavior/Discharge Planning: ?Goal: Identification of resources available to assist in meeting health care needs will improve ?Outcome: Progressing ?Goal: Compliance with treatment plan for underlying cause of condition will improve ?Outcome: Progressing ?  ?Problem: Physical Regulation: ?Goal: Ability to maintain clinical measurements within normal limits will improve ?Outcome: Progressing ?  ?Problem: Safety: ?Goal: Periods of time without injury will increase ?Outcome: Progressing ?  ?Problem: Activity: ?Goal: Will verbalize the importance of balancing activity with adequate rest periods ?Outcome: Progressing ?  ?Problem: Education: ?Goal: Will be free of psychotic symptoms ?Description: Patient will be free of psychosis  ?Outcome: Progressing ?Goal: Knowledge of the prescribed therapeutic regimen will improve ?Outcome: Progressing ?  ?Problem: Coping: ?Goal: Coping ability will improve ?Outcome: Progressing ?Goal: Will verbalize feelings ?Outcome: Progressing ?  ?Problem: Health Behavior/Discharge Planning: ?Goal: Compliance with  prescribed medication regimen will improve ?Outcome: Progressing ?  ?Problem: Nutritional: ?Goal: Ability to achieve adequate nutritional intake will improve ?Outcome: Progressing ?  ?Problem: Role Relationship: ?Goal: Ability to communicate needs accurately will improve ?Outcome: Progressing ?Goal: Ability to interact with others will improve ?Outcome: Progressing ?  ?Problem: Safety: ?Goal: Ability to redirect hostility and anger into socially appropriate behaviors will improve ?Outcome: Progressing ?Goal: Ability to remain free from injury will improve ?Outcome: Progressing ?  ?Problem: Self-Care: ?Goal: Ability to participate in self-care as condition permits will improve ?Outcome: Progressing ?  ?Problem: Self-Concept: ?Goal: Will verbalize positive feelings about self ?Outcome: Progressing ?  ?

## 2021-11-23 NOTE — Anesthesia Procedure Notes (Signed)
Procedure Name: General with mask airway Date/Time: 11/23/2021 2:12 PM Performed by: Mohammed Kindle, CRNA Pre-anesthesia Checklist: Patient identified, Emergency Drugs available, Suction available and Patient being monitored Patient Re-evaluated:Patient Re-evaluated prior to induction Oxygen Delivery Method: Circle system utilized Preoxygenation: Pre-oxygenation with 100% oxygen Induction Type: IV induction Ventilation: Oral airway inserted - appropriate to patient size Placement Confirmation: positive ETCO2, CO2 detector and breath sounds checked- equal and bilateral Dental Injury: Teeth and Oropharynx as per pre-operative assessment

## 2021-11-23 NOTE — H&P (Signed)
Timothy [name] is an 26 y.o. male.   Chief Complaint: Patient reports mood is improved.  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 No pertinent surgical history.  Family History  Problem Relation Age of Onset   Mental illness Brother    Drug abuse Brother    Mental illness Cousin    Suicidality Cousin    Diabetes Maternal Grandmother    Social History:  reports that he has been smoking cigarettes. He has been smoking an average of .25 packs per day. He has never used smokeless tobacco. He reports current alcohol use of about 2.0 standard drinks per week. He reports current drug use. Drugs: Marijuana and MDMA (Ecstacy).  Allergies:  Allergies  Allergen Reactions   Haldol [Haloperidol Lactate] Other (See Comments)    Muscle stiffness   Penicillins Hives    Felt like throat was closing Has patient had a PCN reaction causing immediate rash, facial/tongue/throat swelling, SOB or lightheadedness with hypotension: Unknown Has patient had a PCN reaction causing severe rash involving mucus membranes or skin necrosis: unknown Has patient had a PCN reaction that required hospitalization Unknown Has patient had a PCN reaction occurring within the last 10 years: Unknown If all of the above answers are "NO", then may proceed with Cephalosporin use.    Zyprexa [Olanzapine]     eps    Medications Prior to Admission  Medication Sig Dispense Refill   divalproex (DEPAKOTE) 500 MG DR tablet Take 1 tablet (500 mg total) by mouth every 12 (twelve) hours. 60 tablet 1   FLUoxetine (PROZAC) 20 MG capsule Take 1 capsule (20 mg total) by mouth daily. (Patient not taking: Reported on 07/02/2021) 30 capsule 1   QUEtiapine (SEROQUEL) 200 MG tablet Take 1 tablet (200 mg total) by  mouth at bedtime. (Patient not taking: Reported on 07/02/2021) 30 tablet 0   QUEtiapine (SEROQUEL) 50 MG tablet Take 1 tablet (50 mg total) by mouth 2 (two) times daily. (Patient not taking: Reported on 07/02/2021) 60 tablet 0    Results for orders placed or performed during the hospital encounter of 11/20/21 (from the past 48 hour(s))  Glucose, capillary     Status: None   Collection Time: 11/23/21  8:55 AM  Result Value Ref Range   Glucose-Capillary 84 70 - 99 mg/dL    Comment: Glucose reference range applies only to samples taken after fasting for at least 8 hours.   No results found.  Review of Systems  Constitutional: Negative.   HENT: Negative.    Eyes: Negative.   Respiratory: Negative.    Cardiovascular: Negative.   Gastrointestinal: Negative.   Musculoskeletal: Negative.   Skin: Negative.   Neurological: Negative.   Psychiatric/Behavioral:  Positive for dysphoric mood and hallucinations. Negative for suicidal ideas.    Blood pressure 127/72, pulse 79, temperature 98.3 F (36.8 C), resp. rate 18, height 6\' 3"  (1.905 m), weight 93.4 kg, SpO2 100 %. Physical Exam Vitals reviewed.  Constitutional:      Appearance: He is well-developed.  HENT:     Head: Normocephalic and atraumatic.  Eyes:     Conjunctiva/sclera: Conjunctivae normal.     Pupils: Pupils are equal, round, and reactive to light.  Cardiovascular:     Heart sounds: Normal heart sounds.  Pulmonary:  Effort: Pulmonary effort is normal.  Abdominal:     Palpations: Abdomen is soft.  Musculoskeletal:        General: Normal range of motion.     Cervical back: Normal range of motion.  Skin:    General: Skin is warm and dry.  Neurological:     General: No focal deficit present.     Mental Status: He is alert.  Psychiatric:        Attention and Perception: Attention normal. He perceives auditory hallucinations.        Mood and Affect: Mood is depressed.        Speech: Speech normal.        Behavior:  Behavior is cooperative.        Thought Content: Thought content does not include suicidal ideation.     Assessment/Plan Treatment today is #5 after which we will be coming probably near the end of the index course after another treatment or to  Mordecai Rasmussen, MD 11/23/2021, 2:30 PM

## 2021-11-23 NOTE — Progress Notes (Signed)
[facility]  11/23/2021 2:28 PM Timothy FeltyMarkie T [name]  MRN:  [id number] Subjective: Patient seen and chart reviewed.  Patient reports mood is improved.  Still has hallucinations but not as much as before.  Not having any active suicidal ideation.  No new physical complaints. Principal Problem: Schizoaffective disorder, bipolar type (HCC) Diagnosis: Principal Problem:   Schizoaffective disorder, bipolar type (HCC)  Total Time spent with patient: 20 minutes  Past Psychiatric History: Past history of recurrent depression accompanying psychosis as part of schizoaffective disorder  Past Medical History:  Past Medical History:  Diagnosis Date   Cannabis abuse    Paranoid schizophrenia (HCC)    Schizoaffective disorder, depressive type (HCC) 09/17/2014   History reviewed. No pertinent surgical history. Family History:  Family History  Problem Relation Age of Onset   Mental illness Brother    Drug abuse Brother    Mental illness Cousin    Suicidality Cousin    Diabetes Maternal Grandmother    Family Psychiatric  History: See previous Social History:  Social History   Substance and Sexual Activity  Alcohol Use Yes   Alcohol/week: 2.0 standard drinks   Types: 2 Cans of beer per week     Social History   Substance and Sexual Activity  Drug Use Yes   Types: Marijuana, MDMA Chiropodist(Ecstacy)    Social History   Socioeconomic History   Marital status: Single    Spouse name: Not on file   Number of children: Not on file   Years of education: Not on file   Highest education level: Not on file  Occupational History   Not on file  Tobacco Use   Smoking status: Some Days    Packs/day: 0.25    Types: Cigarettes   Smokeless tobacco: Never  Vaping Use   Vaping Use: Never used  Substance and Sexual Activity   Alcohol use: Yes    Alcohol/week: 2.0 standard drinks    Types: 2 Cans of beer per week   Drug use: Yes    Types: Marijuana, MDMA (Ecstacy)   Sexual activity: Yes  Other  Topics Concern   Not on file  Social History Narrative   Not on file   Social Determinants of Health   Financial Resource Strain: Not on file  Food Insecurity: Not on file  Transportation Needs: Not on file  Physical Activity: Not on file  Stress: Not on file  Social Connections: Not on file   Additional Social History:                         Sleep: Fair  Appetite:  Fair  Current Medications: Current Facility-Administered Medications  Medication Dose Route Frequency Provider Last Rate Last Admin   acetaminophen (TYLENOL) tablet 650 mg  650 mg Oral Q6H PRN Charm RingsLord, Jamison Y, NP   650 mg at 11/19/21 0825   alum & mag hydroxide-simeth (MAALOX/MYLANTA) 200-200-20 MG/5ML suspension 30 mL  30 mL Oral Q4H PRN Charm RingsLord, Jamison Y, NP       buPROPion (WELLBUTRIN XL) 24 hr tablet 300 mg  300 mg Oral Daily Kortlyn Koltz T, MD   300 mg at 11/22/21 0847   fentaNYL (SUBLIMAZE) injection 25 mcg  25 mcg Intravenous Q5 min PRN Darleene CleaverVan Staveren, Gijsbertus F, MD       FLUoxetine (PROZAC) capsule 20 mg  20 mg Oral Daily Charm RingsLord, Jamison Y, NP   20 mg at 11/22/21 0847   ibuprofen (ADVIL) tablet 600  mg  600 mg Oral Q6H PRN Jerron Niblack T, MD   600 mg at 11/19/21 0825   magnesium hydroxide (MILK OF MAGNESIA) suspension 30 mL  30 mL Oral Daily PRN Charm Rings, NP       midazolam (VERSED) injection 4 mg  4 mg Intravenous Once Daisi Kentner, Jackquline Denmark, MD       nicotine (NICODERM CQ - dosed in mg/24 hours) patch 14 mg  14 mg Transdermal Daily Markez Dowland T, MD   14 mg at 11/22/21 0847   ondansetron (ZOFRAN) injection 4 mg  4 mg Intravenous Once PRN Darleene Cleaver, Gerrit Heck, MD       QUEtiapine (SEROQUEL) tablet 300 mg  300 mg Oral QHS Vihan Santagata, Jackquline Denmark, MD   300 mg at 11/22/21 2204    Lab Results:  Results for orders placed or performed during the hospital encounter of 11/20/21 (from the past 48 hour(s))  Glucose, capillary     Status: None   Collection Time: 11/23/21  8:55 AM  Result Value Ref Range    Glucose-Capillary 84 70 - 99 mg/dL    Comment: Glucose reference range applies only to samples taken after fasting for at least 8 hours.    Blood Alcohol level:  Lab Results  Component Value Date   ETH <10 10/31/2021   ETH <10 06/30/2021    Metabolic Disorder Labs: Lab Results  Component Value Date   HGBA1C 5.5 11/04/2021   MPG 111.15 11/04/2021   MPG 114 06/17/2021   Lab Results  Component Value Date   PROLACTIN 33.2 (H) 08/15/2016   Lab Results  Component Value Date   CHOL 149 11/04/2021   TRIG 56 11/04/2021   HDL 35 (L) 11/04/2021   CHOLHDL 4.3 11/04/2021   VLDL 11 11/04/2021   LDLCALC 103 (H) 11/04/2021   LDLCALC 111 (H) 06/17/2021    Physical Findings: AIMS:  , ,  ,  ,    CIWA:    COWS:     Musculoskeletal: Strength & Muscle Tone: within normal limits Gait & Station: normal Patient leans: N/A  Psychiatric Specialty Exam:  Presentation  General Appearance: Casual; Neat  Eye Contact:Fair  Speech:Slow  Speech Volume:Decreased  Handedness:Right   Mood and Affect  Mood:Anxious; Depressed  Affect:Constricted   Thought Process  Thought Processes:Linear  Descriptions of Associations:Circumstantial  Orientation:Full (Time, Place and Person)  Thought Content:Logical  History of Schizophrenia/Schizoaffective disorder:Yes  Duration of Psychotic Symptoms:Greater than six months  Hallucinations:No data recorded Ideas of Reference:None  Suicidal Thoughts:No data recorded Homicidal Thoughts:No data recorded  Sensorium  Memory:Immediate Fair; Remote Fair  Judgment:Fair  Insight:Fair   Executive Functions  Concentration:Fair  Attention Span:Fair  Recall:Fair  Fund of Knowledge:Fair  Language:Fair   Psychomotor Activity  Psychomotor Activity:No data recorded  Assets  Assets:Communication Skills; Desire for Improvement; Physical Health   Sleep  Sleep:No data recorded   Physical Exam: Physical Exam Vitals and nursing  note reviewed.  Constitutional:      Appearance: Normal appearance.  HENT:     Head: Normocephalic and atraumatic.     Mouth/Throat:     Pharynx: Oropharynx is clear.  Eyes:     Pupils: Pupils are equal, round, and reactive to light.  Cardiovascular:     Rate and Rhythm: Normal rate and regular rhythm.  Pulmonary:     Effort: Pulmonary effort is normal.     Breath sounds: Normal breath sounds.  Abdominal:     General: Abdomen is flat.  Palpations: Abdomen is soft.  Musculoskeletal:        General: Normal range of motion.  Skin:    General: Skin is warm and dry.  Neurological:     General: No focal deficit present.     Mental Status: He is alert. Mental status is at baseline.  Psychiatric:        Attention and Perception: Attention normal.        Mood and Affect: Mood normal.        Speech: Speech normal.        Behavior: Behavior is cooperative.        Thought Content: Thought content normal.        Cognition and Memory: Cognition normal.        Judgment: Judgment normal.   Review of Systems  Constitutional: Negative.   HENT: Negative.    Eyes: Negative.   Respiratory: Negative.    Cardiovascular: Negative.   Gastrointestinal: Negative.   Musculoskeletal: Negative.   Skin: Negative.   Neurological: Negative.   Psychiatric/Behavioral:  Positive for depression and hallucinations. Negative for suicidal ideas.   Blood pressure 127/72, pulse 79, temperature 98.3 F (36.8 C), resp. rate 18, height 6\' 3"  (1.905 m), weight 93.4 kg, SpO2 100 %. Body mass index is 25.74 kg/m.   Treatment Plan Summary: Medication management and Plan no change to medication.  ECT today is #5.  Continue with index course.  , MD 11/23/2021, 2:28 PM

## 2021-11-23 NOTE — Progress Notes (Signed)
Pt isolated to room, no interaction with peers and staff. Pt had ECT number 4 today. Pt denies SI/HI and AVH. Affect sad and he does not want to talk. Anxiety score 0/10 and depression 8/10. Pt encouraged to come out of his room and to interact with peers, plus attend groups. Pt stays in his bed and sleeps a lot.

## 2021-11-23 NOTE — Transfer of Care (Signed)
Immediate Anesthesia Transfer of Care Note  Patient: Timothy [name]  Procedure(s) Performed: ECT TX  Patient Location: PACU  Anesthesia Type:General  Level of Consciousness: awake, drowsy and patient cooperative  Airway & Oxygen Therapy: Patient Spontanous Breathing and Patient connected to face mask oxygen  Post-op Assessment: Report given to RN and Post -op Vital signs reviewed and stable  Post vital signs: Reviewed and stable  Last Vitals:  Vitals Value Taken Time  BP 127/72 11/23/21 1410  Temp 36.8 C 11/23/21 1404  Pulse 77 11/23/21 1412  Resp 17 11/23/21 1412  SpO2 99 % 11/23/21 1412  Vitals shown include unvalidated device data.  Last Pain:  Vitals:   11/23/21 1404  TempSrc:   PainSc: (P) 0-No pain      Patients Stated Pain Goal: 3 (11/18/21 0830)  Complications: No notable events documented.

## 2021-11-23 NOTE — H&P (Signed)
Timothy [name] is an 26 y.o. male.   Chief Complaint: improved HPI: recurrent depression  Past Medical History:  Diagnosis Date   Cannabis abuse    Paranoid schizophrenia (HCC)    Schizoaffective disorder, depressive type (HCC) 09/17/2014    History reviewed. No pertinent surgical history.  Family History  Problem Relation Age of Onset   Mental illness Brother    Drug abuse Brother    Mental illness Cousin    Suicidality Cousin    Diabetes Maternal Grandmother    Social History:  reports that he has been smoking cigarettes. He has been smoking an average of .25 packs per day. He has never used smokeless tobacco. He reports current alcohol use of about 2.0 standard drinks per week. He reports current drug use. Drugs: Marijuana and MDMA (Ecstacy).  Allergies:  Allergies  Allergen Reactions   Haldol [Haloperidol Lactate] Other (See Comments)    Muscle stiffness   Penicillins Hives    Felt like throat was closing Has patient had a PCN reaction causing immediate rash, facial/tongue/throat swelling, SOB or lightheadedness with hypotension: Unknown Has patient had a PCN reaction causing severe rash involving mucus membranes or skin necrosis: unknown Has patient had a PCN reaction that required hospitalization Unknown Has patient had a PCN reaction occurring within the last 10 years: Unknown If all of the above answers are "NO", then may proceed with Cephalosporin use.    Zyprexa [Olanzapine]     eps    Medications Prior to Admission  Medication Sig Dispense Refill   divalproex (DEPAKOTE) 500 MG DR tablet Take 1 tablet (500 mg total) by mouth every 12 (twelve) hours. 60 tablet 1   FLUoxetine (PROZAC) 20 MG capsule Take 1 capsule (20 mg total) by mouth daily. (Patient not taking: Reported on 07/02/2021) 30 capsule 1   QUEtiapine (SEROQUEL) 200 MG tablet Take 1 tablet (200 mg total) by mouth at bedtime. (Patient not taking: Reported on 07/02/2021) 30 tablet 0   QUEtiapine (SEROQUEL)  50 MG tablet Take 1 tablet (50 mg total) by mouth 2 (two) times daily. (Patient not taking: Reported on 07/02/2021) 60 tablet 0    Results for orders placed or performed during the hospital encounter of 11/20/21 (from the past 48 hour(s))  Glucose, capillary     Status: None   Collection Time: 11/23/21  8:55 AM  Result Value Ref Range   Glucose-Capillary 84 70 - 99 mg/dL    Comment: Glucose reference range applies only to samples taken after fasting for at least 8 hours.   No results found.  Review of Systems  Constitutional: Negative.   HENT: Negative.    Eyes: Negative.   Respiratory: Negative.    Cardiovascular: Negative.   Gastrointestinal: Negative.   Musculoskeletal: Negative.   Skin: Negative.   Neurological: Negative.   Psychiatric/Behavioral:  Positive for dysphoric mood and hallucinations.    Blood pressure (!) 117/56, pulse 72, temperature 98.3 F (36.8 C), temperature source Tympanic, resp. rate 18, height 6\' 3"  (1.905 m), weight 93.4 kg, SpO2 98 %. Physical Exam Vitals and nursing note reviewed.  Constitutional:      Appearance: He is well-developed.  HENT:     Head: Normocephalic and atraumatic.  Eyes:     Conjunctiva/sclera: Conjunctivae normal.     Pupils: Pupils are equal, round, and reactive to light.  Cardiovascular:     Heart sounds: Normal heart sounds.  Pulmonary:     Effort: Pulmonary effort is normal.  Abdominal:  Palpations: Abdomen is soft.  Musculoskeletal:        General: Normal range of motion.     Cervical back: Normal range of motion.  Skin:    General: Skin is warm and dry.  Neurological:     General: No focal deficit present.     Mental Status: He is alert.  Psychiatric:        Attention and Perception: Attention normal.        Mood and Affect: Mood is depressed.        Speech: Speech normal.        Behavior: Behavior normal.        Cognition and Memory: Cognition normal.        Judgment: Judgment normal.      Assessment/Plan Continue index  Mordecai Rasmussen, MD 11/23/2021, 12:29 PM

## 2021-11-24 ENCOUNTER — Other Ambulatory Visit: Payer: Self-pay | Admitting: Psychiatry

## 2021-11-24 DIAGNOSIS — F250 Schizoaffective disorder, bipolar type: Secondary | ICD-10-CM | POA: Diagnosis not present

## 2021-11-24 NOTE — Progress Notes (Signed)
Patient more active on the unit tonight than lat week, denies SI/HI/AVH. Pt compliant with medication administration per MD orders. Pt given education, support, and encouragement to be active in his treatment plan. Pt being monitored Q 15 minutes for safety per unit protocol. Pt remains safe on the unit. Pt stated that ECT went well today and that he is feeling better.

## 2021-11-24 NOTE — Progress Notes (Signed)
Recreation Therapy Notes  Date: 11/24/2021  Time: 10:40am    Location: Craft room   Behavioral response: N/A   Intervention Topic: Relaxation    Discussion/Intervention: Patient refused to attend group.   Clinical Observations/Feedback:  Patient refused to attend group.    Keelyn Monjaras LRT/CTRS         Aleksa Collinsworth 11/24/2021 12:14 PM

## 2021-11-24 NOTE — Progress Notes (Signed)
[facility] MD Progress Note  11/24/2021 2:57 PM Timothy [name]  MRN:  [id number] Subjective: Follow-up patient with psychotic depression as part of schizoaffective disorder.  Patient seen.  He says that he is feeling better.  Mood is much less depressed.  He is still quiet and a little withdrawn but makes better eye contact.  Denies having hallucinations.  Denies suicidal thoughts. Principal Problem: Schizoaffective disorder, bipolar type (HCC) Diagnosis: Principal Problem:   Schizoaffective disorder, bipolar type (HCC)  Total Time spent with patient: 30 minutes  Past Psychiatric History: Past history of schizoaffective disorder depressive type  Past Medical History:  Past Medical History:  Diagnosis Date   Cannabis abuse    Paranoid schizophrenia (HCC)    Schizoaffective disorder, depressive type (HCC) 09/17/2014   History reviewed. No pertinent surgical history. Family History:  Family History  Problem Relation Age of Onset   Mental illness Brother    Drug abuse Brother    Mental illness Cousin    Suicidality Cousin    Diabetes Maternal Grandmother    Family Psychiatric  History: See previous Social History:  Social History   Substance and Sexual Activity  Alcohol Use Yes   Alcohol/week: 2.0 standard drinks   Types: 2 Cans of beer per week     Social History   Substance and Sexual Activity  Drug Use Yes   Types: Marijuana, MDMA Chiropodist)    Social History   Socioeconomic History   Marital status: Single    Spouse name: Not on file   Number of children: Not on file   Years of education: Not on file   Highest education level: Not on file  Occupational History   Not on file  Tobacco Use   Smoking status: Some Days    Packs/day: 0.25    Types: Cigarettes   Smokeless tobacco: Never  Vaping Use   Vaping Use: Never used  Substance and Sexual Activity   Alcohol use: Yes    Alcohol/week: 2.0 standard drinks    Types: 2 Cans of beer per week   Drug use: Yes    Types:  Marijuana, MDMA (Ecstacy)   Sexual activity: Yes  Other Topics Concern   Not on file  Social History Narrative   Not on file   Social Determinants of Health   Financial Resource Strain: Not on file  Food Insecurity: Not on file  Transportation Needs: Not on file  Physical Activity: Not on file  Stress: Not on file  Social Connections: Not on file   Additional Social History:                         Sleep: Fair  Appetite:  Negative  Current Medications: Current Facility-Administered Medications  Medication Dose Route Frequency Provider Last Rate Last Admin   acetaminophen (TYLENOL) tablet 650 mg  650 mg Oral Q6H PRN Charm Rings, NP   650 mg at 11/24/21 0848   alum & mag hydroxide-simeth (MAALOX/MYLANTA) 200-200-20 MG/5ML suspension 30 mL  30 mL Oral Q4H PRN Charm Rings, NP       buPROPion (WELLBUTRIN XL) 24 hr tablet 300 mg  300 mg Oral Daily Antwoine Zorn T, MD   300 mg at 11/24/21 0848   fentaNYL (SUBLIMAZE) injection 25 mcg  25 mcg Intravenous Q5 min PRN Darleene Cleaver, Gijsbertus F, MD       FLUoxetine (PROZAC) capsule 20 mg  20 mg Oral Daily Charm Rings, NP  20 mg at 11/24/21 0848   ibuprofen (ADVIL) tablet 600 mg  600 mg Oral Q6H PRN Dagny Fiorentino T, MD   600 mg at 11/24/21 0848   magnesium hydroxide (MILK OF MAGNESIA) suspension 30 mL  30 mL Oral Daily PRN Charm RingsLord, Jamison Y, NP       midazolam (VERSED) injection 4 mg  4 mg Intravenous Once Ainara Eldridge, Jackquline DenmarkJohn T, MD       nicotine (NICODERM CQ - dosed in mg/24 hours) patch 14 mg  14 mg Transdermal Daily Jola Critzer T, MD   14 mg at 11/24/21 0848   ondansetron (ZOFRAN) injection 4 mg  4 mg Intravenous Once PRN Darleene CleaverVan Staveren, Gerrit HeckGijsbertus F, MD       QUEtiapine (SEROQUEL) tablet 300 mg  300 mg Oral QHS Nishawn Rotan, Jackquline DenmarkJohn T, MD   300 mg at 11/23/21 2214    Lab Results:  Results for orders placed or performed during the hospital encounter of 11/20/21 (from the past 48 hour(s))  Glucose, capillary     Status: None    Collection Time: 11/23/21  8:55 AM  Result Value Ref Range   Glucose-Capillary 84 70 - 99 mg/dL    Comment: Glucose reference range applies only to samples taken after fasting for at least 8 hours.    Blood Alcohol level:  Lab Results  Component Value Date   ETH <10 10/31/2021   ETH <10 06/30/2021    Metabolic Disorder Labs: Lab Results  Component Value Date   HGBA1C 5.5 11/04/2021   MPG 111.15 11/04/2021   MPG 114 06/17/2021   Lab Results  Component Value Date   PROLACTIN 33.2 (H) 08/15/2016   Lab Results  Component Value Date   CHOL 149 11/04/2021   TRIG 56 11/04/2021   HDL 35 (L) 11/04/2021   CHOLHDL 4.3 11/04/2021   VLDL 11 11/04/2021   LDLCALC 103 (H) 11/04/2021   LDLCALC 111 (H) 06/17/2021    Physical Findings: AIMS:  , ,  ,  ,    CIWA:    COWS:     Musculoskeletal: Strength & Muscle Tone: within normal limits Gait & Station: normal Patient leans: N/A  Psychiatric Specialty Exam:  Presentation  General Appearance: Casual; Neat  Eye Contact:Fair  Speech:Slow  Speech Volume:Decreased  Handedness:Right   Mood and Affect  Mood:Anxious; Depressed  Affect:Constricted   Thought Process  Thought Processes:Linear  Descriptions of Associations:Circumstantial  Orientation:Full (Time, Place and Person)  Thought Content:Logical  History of Schizophrenia/Schizoaffective disorder:Yes  Duration of Psychotic Symptoms:Greater than six months  Hallucinations:No data recorded Ideas of Reference:None  Suicidal Thoughts:No data recorded Homicidal Thoughts:No data recorded  Sensorium  Memory:Immediate Fair; Remote Fair  Judgment:Fair  Insight:Fair   Executive Functions  Concentration:Fair  Attention Span:Fair  Recall:Fair  Fund of Knowledge:Fair  Language:Fair   Psychomotor Activity  Psychomotor Activity:No data recorded  Assets  Assets:Communication Skills; Desire for Improvement; Physical Health   Sleep  Sleep:No data  recorded   Physical Exam: Physical Exam Vitals and nursing note reviewed.  Constitutional:      Appearance: Normal appearance.  HENT:     Head: Normocephalic and atraumatic.     Mouth/Throat:     Pharynx: Oropharynx is clear.  Eyes:     Pupils: Pupils are equal, round, and reactive to light.  Cardiovascular:     Rate and Rhythm: Normal rate and regular rhythm.  Pulmonary:     Effort: Pulmonary effort is normal.     Breath sounds: Normal breath sounds.  Abdominal:  General: Abdomen is flat.     Palpations: Abdomen is soft.  Musculoskeletal:        General: Normal range of motion.  Skin:    General: Skin is warm and dry.  Neurological:     General: No focal deficit present.     Mental Status: He is alert. Mental status is at baseline.  Psychiatric:        Attention and Perception: Attention normal.        Mood and Affect: Mood normal.        Speech: Speech is delayed.        Behavior: Behavior is slowed.        Thought Content: Thought content normal.        Cognition and Memory: Memory is impaired.   Review of Systems  Constitutional: Negative.   HENT: Negative.    Eyes: Negative.   Respiratory: Negative.    Cardiovascular: Negative.   Gastrointestinal: Negative.   Musculoskeletal: Negative.   Skin: Negative.   Neurological: Negative.   Psychiatric/Behavioral:  Negative for depression, hallucinations, substance abuse and suicidal ideas.   Blood pressure 124/65, pulse 80, temperature 98.7 F (37.1 C), temperature source Oral, resp. rate 18, height 6\' 3"  (1.905 m), weight 93.4 kg, SpO2 100 %. Body mass index is 25.74 kg/m.   Treatment Plan Summary: Medication management and Plan no change to medication management.  ECT tomorrow after which we may be in a position to be thinking about discharge in 1 to 2 days.  , MD 11/24/2021, 2:57 PM

## 2021-11-24 NOTE — Plan of Care (Signed)
D- Patient alert and oriented. Patient presented in a pleasant mood on assessment reporting that he slept "fair" last night and had complaints of lower back pain. Patient rated his pain level a "6/10", in which he did request PRN medication from this writer. Although patient endorsed SI on his self-inventory, he denies SI, HI, AVH at the time of assessment. Patient continues to endorse anxiety, depression and hopelessness on his self-inventory, stating "I'm just ready to go home". Patient's goal for today is to "try and stay focused on my goal", in which he will "finish treatment and look forward to going home", in order to accomplish his goal.  A- Scheduled medications administered to patient, per MD orders. Support and encouragement provided.  Routine safety checks conducted every 15 minutes.  Patient informed to notify staff with problems or concerns.  R- No adverse drug reactions noted. Patient contracts for safety at this time. Patient compliant with medications and treatment plan. Patient receptive, calm, and cooperative. Patient interacts well with others on the unit.  Patient remains safe at this time.  Problem: Education: Goal: Knowledge of [place] [facility] Education information/materials will improve Outcome: Progressing Goal: Emotional status will improve Outcome: Progressing Goal: Mental status will improve Description: Patient will became more alert no confusion to place and situation  Outcome: Progressing Goal: Verbalization of understanding the information provided will improve Outcome: Progressing   Problem: Activity: Goal: Interest or engagement in activities will improve Outcome: Progressing Goal: Sleeping patterns will improve Outcome: Progressing   Problem: Coping: Goal: Ability to verbalize frustrations and anger appropriately will improve Description: Patient will verbalize frustration and anger to the staff appropriately.  Outcome: Progressing Goal: Ability to  demonstrate self-control will improve Outcome: Progressing   Problem: Health Behavior/Discharge Planning: Goal: Identification of resources available to assist in meeting health care needs will improve Outcome: Progressing Goal: Compliance with treatment plan for underlying cause of condition will improve Outcome: Progressing   Problem: Physical Regulation: Goal: Ability to maintain clinical measurements within normal limits will improve Outcome: Progressing   Problem: Safety: Goal: Periods of time without injury will increase Outcome: Progressing   Problem: Activity: Goal: Will verbalize the importance of balancing activity with adequate rest periods Outcome: Progressing   Problem: Education: Goal: Will be free of psychotic symptoms Description: Patient will be free of psychosis  Outcome: Progressing Goal: Knowledge of the prescribed therapeutic regimen will improve Outcome: Progressing   Problem: Coping: Goal: Coping ability will improve Outcome: Progressing Goal: Will verbalize feelings Outcome: Progressing   Problem: Health Behavior/Discharge Planning: Goal: Compliance with prescribed medication regimen will improve Outcome: Progressing   Problem: Nutritional: Goal: Ability to achieve adequate nutritional intake will improve Outcome: Progressing   Problem: Role Relationship: Goal: Ability to communicate needs accurately will improve Outcome: Progressing Goal: Ability to interact with others will improve Outcome: Progressing   Problem: Safety: Goal: Ability to redirect hostility and anger into socially appropriate behaviors will improve Outcome: Progressing Goal: Ability to remain free from injury will improve Outcome: Progressing   Problem: Self-Care: Goal: Ability to participate in self-care as condition permits will improve Outcome: Progressing   Problem: Self-Concept: Goal: Will verbalize positive feelings about self Outcome: Progressing

## 2021-11-24 NOTE — Group Note (Signed)
[facility] LCSW Group Therapy Note   Group Date: 11/24/2021 Start Time: 1300 End Time: 1400  Type of Therapy/Topic:  Group Therapy:  Feelings about Diagnosis  Participation Level:  Active   Description of Group:    This group will allow patients to explore their thoughts and feelings about diagnoses they have received. Patients will be guided to explore their level of understanding and acceptance of these diagnoses. Facilitator will encourage patients to process their thoughts and feelings about the reactions of others to their diagnosis, and will guide patients in identifying ways to discuss their diagnosis with significant others in their lives. This group will be process-oriented, with patients participating in exploration of their own experiences as well as giving and receiving support and challenge from other group members.   Therapeutic Goals: 1. Patient will demonstrate understanding of diagnosis as evidence by identifying two or more symptoms of the disorder:  2. Patient will be able to express two feelings regarding the diagnosis 3. Patient will demonstrate ability to communicate their needs through discussion and/or role plays  Summary of Patient Progress: Patient came into the group late. However, he did participate in the discussion. He shared that the treatment (ECT) has been helpful and he is looking forward to what the future holds for him after leaving the hospital. He appeared open and receptive to feedback/comments from peers and facilitator.  Therapeutic Modalities:   Cognitive Behavioral Therapy Brief Therapy Feelings Identification    [name], LCSW

## 2021-11-24 NOTE — Progress Notes (Signed)
Patient has been more present in the milieu this afternoon. Patient attended social work group and has been down in the dayroom with peers, watching television. Patient has not had any issues thus far and remains safe on the unit.

## 2021-11-25 ENCOUNTER — Other Ambulatory Visit: Payer: Self-pay

## 2021-11-25 ENCOUNTER — Inpatient Hospital Stay: Payer: 59 | Admitting: Anesthesiology

## 2021-11-25 ENCOUNTER — Encounter: Payer: Self-pay | Admitting: Psychiatry

## 2021-11-25 ENCOUNTER — Ambulatory Visit: Payer: 59

## 2021-11-25 DIAGNOSIS — Z01818 Encounter for other preprocedural examination: Secondary | ICD-10-CM | POA: Insufficient documentation

## 2021-11-25 DIAGNOSIS — F250 Schizoaffective disorder, bipolar type: Secondary | ICD-10-CM | POA: Diagnosis not present

## 2021-11-25 LAB — GLUCOSE, CAPILLARY: Glucose-Capillary: 91 mg/dL (ref 70–99)

## 2021-11-25 MED ORDER — METHOHEXITAL SODIUM 100 MG/10ML IV SOSY
PREFILLED_SYRINGE | INTRAVENOUS | Status: DC | PRN
  Administered 2021-11-25: 100 mg via INTRAVENOUS

## 2021-11-25 MED ORDER — MIDAZOLAM HCL 2 MG/2ML IJ SOLN
INTRAMUSCULAR | Status: AC
  Filled 2021-11-25: qty 2

## 2021-11-25 MED ORDER — ACETAMINOPHEN 325 MG PO TABS: 650.0000 mg | ORAL_TABLET | Freq: Once | ORAL | Status: DC | PRN

## 2021-11-25 MED ORDER — MIDAZOLAM HCL 2 MG/2ML IJ SOLN
INTRAMUSCULAR | Status: DC | PRN
  Administered 2021-11-25: 2 mg via INTRAVENOUS
  Administered 2021-11-25: 4 mg via INTRAVENOUS

## 2021-11-25 MED ORDER — FLUOXETINE HCL 20 MG PO CAPS
20.0000 mg | ORAL_CAPSULE | Freq: Every day | ORAL | 0 refills | Status: DC
  Filled 2021-11-25: qty 30, 30d supply, fill #0

## 2021-11-25 MED ORDER — GLYCOPYRROLATE 0.2 MG/ML IJ SOLN
INTRAMUSCULAR | Status: AC
  Administered 2021-11-25: 0.4 mg via INTRAVENOUS
  Filled 2021-11-25: qty 2

## 2021-11-25 MED ORDER — MIDAZOLAM HCL 2 MG/2ML IJ SOLN
4.0000 mg | Freq: Once | INTRAMUSCULAR | Status: DC
Start: 2021-11-25 — End: 2021-12-01

## 2021-11-25 MED ORDER — QUETIAPINE FUMARATE 300 MG PO TABS
300.0000 mg | ORAL_TABLET | Freq: Every day | ORAL | 0 refills | Status: DC
  Filled 2021-11-25: qty 30, 30d supply, fill #0

## 2021-11-25 MED ORDER — ACETAMINOPHEN 160 MG/5ML PO SOLN: 325.0000 mg | ORAL | Status: DC | PRN

## 2021-11-25 MED ORDER — ONDANSETRON HCL 4 MG/2ML IJ SOLN: 4.0000 mg | Freq: Once | INTRAMUSCULAR | Status: DC | PRN

## 2021-11-25 MED ORDER — SUCCINYLCHOLINE CHLORIDE 200 MG/10ML IV SOSY
PREFILLED_SYRINGE | INTRAVENOUS | Status: DC | PRN
  Administered 2021-11-25: 250 mg via INTRAVENOUS

## 2021-11-25 MED ORDER — GLYCOPYRROLATE 0.2 MG/ML IJ SOLN: 0.4000 mg | Freq: Once | INTRAMUSCULAR | Status: AC

## 2021-11-25 MED ORDER — DEXMEDETOMIDINE (PRECEDEX) IN NS 20 MCG/5ML (4 MCG/ML) IV SYRINGE
PREFILLED_SYRINGE | INTRAVENOUS | Status: DC | PRN
  Administered 2021-11-25: 20 ug via INTRAVENOUS

## 2021-11-25 MED ORDER — KETOROLAC TROMETHAMINE 30 MG/ML IJ SOLN: 30.0000 mg | Freq: Once | INTRAMUSCULAR | Status: AC

## 2021-11-25 MED ORDER — METHOHEXITAL SODIUM 0.5 G IJ SOLR
INTRAMUSCULAR | Status: AC
  Filled 2021-11-25: qty 500

## 2021-11-25 MED ORDER — KETOROLAC TROMETHAMINE 30 MG/ML IJ SOLN
INTRAMUSCULAR | Status: AC
  Administered 2021-11-25: 30 mg via INTRAVENOUS
  Filled 2021-11-25: qty 1

## 2021-11-25 MED ORDER — QUETIAPINE FUMARATE 50 MG PO TABS
50.0000 mg | ORAL_TABLET | Freq: Two times a day (BID) | ORAL | 0 refills | Status: DC
Start: 2021-11-25 — End: 2021-11-27
  Filled 2021-11-25: qty 60, 30d supply, fill #0

## 2021-11-25 MED ORDER — SODIUM CHLORIDE 0.9 % IV SOLN: INTRAVENOUS | Status: DC | PRN

## 2021-11-25 MED ORDER — SODIUM CHLORIDE 0.9 % IV SOLN
500.0000 mL | Freq: Once | INTRAVENOUS | Status: AC
  Administered 2021-11-25: 500 mL via INTRAVENOUS

## 2021-11-25 NOTE — Progress Notes (Signed)
Recreation Therapy Notes   Date: 11/25/2021  Time: 10:45am    Location: Courtyard    Behavioral response: N/A   Intervention Topic: Leisure    Discussion/Intervention: Patient refused to attend group.   Clinical Observations/Feedback:  Patient refused to attend group.    [name] LRT/CTRS        Timothy [name] 11/25/2021 1:19 PM 

## 2021-11-25 NOTE — Procedures (Signed)
ECT SERVICES Physician's Interval Evaluation & Treatment Note  Patient Identification: Timothy [name] MRN:  [id number] Date of Evaluation:  11/25/2021 TX #: 6  MADRS:   MMSE:   P.E. Findings:  No change to physical exam  Psychiatric Interval Note:  Reports depression is much improved.  No hallucinations.  Feeling better.  Patient is brighter  Subjective:  Patient is a 26 y.o. male seen for evaluation for Electroconvulsive Therapy. Feeling better.  No longer having suicidal thoughts  Treatment Summary:   []   Right Unilateral             [x]  Bilateral   % Energy : 1.0 ms 40%   Impedance: 1000 ohms  Seizure Energy Index: 13,838 V squared  Postictal Suppression Index: 55%  Seizure Concordance Index: 98%  Medications  Pre Shock: Robinul 0.4 mg Toradol 30 mg Brevital 100 mg succinylcholine 250 mg  Post Shock: Versed 6 mg Precedex 30 mcg  Seizure Duration: 42 seconds EMG 134 seconds EEG   Comments: Patient really would like to be discharged.  I would like also however to make sure he is getting as much benefit as possible.  Reassess tomorrow.  Lungs:  [x]   Clear to auscultation               []  Other:   Heart:    [x]   Regular rhythm             []  irregular rhythm    [x]   Previous H&P reviewed, patient examined and there are NO CHANGES                 []   Previous H&P reviewed, patient examined and there are changes noted.   , MD 5/24/20233:28 PM

## 2021-11-25 NOTE — Progress Notes (Signed)
[facility] MD Progress Note  11/25/2021 3:13 PM Timothy [name]  MRN:  [id number] Subjective: Follow-up with this patient with schizoaffective disorder depressive type.  Patient had sixth ECT treatment today.  Prior to treatment reported he was feeling quite a bit better.  Denied feeling particularly depressed.  Denied hallucinations.  Affect still a little blunted but much better than what we had been used to.  No physical complaints no major problems of side effects. Principal Problem: Schizoaffective disorder, bipolar type (HCC) Diagnosis: Principal Problem:   Schizoaffective disorder, bipolar type (HCC)  Total Time spent with patient: 30 minutes  Past Psychiatric History: Patient has a long history of this sort of thing with psychotic depression recurrently with suicidal thoughts.  Past Medical History:  Past Medical History:  Diagnosis Date   Cannabis abuse    Paranoid schizophrenia (HCC)    Schizoaffective disorder, depressive type (HCC) 09/17/2014   History reviewed. No pertinent surgical history. Family History:  Family History  Problem Relation Age of Onset   Mental illness Brother    Drug abuse Brother    Mental illness Cousin    Suicidality Cousin    Diabetes Maternal Grandmother    Family Psychiatric  History: See previous Social History:  Social History   Substance and Sexual Activity  Alcohol Use Yes   Alcohol/week: 2.0 standard drinks   Types: 2 Cans of beer per week     Social History   Substance and Sexual Activity  Drug Use Yes   Types: Marijuana, MDMA Chiropodist)    Social History   Socioeconomic History   Marital status: Single    Spouse name: Not on file   Number of children: Not on file   Years of education: Not on file   Highest education level: Not on file  Occupational History   Not on file  Tobacco Use   Smoking status: Some Days    Packs/day: 0.25    Types: Cigarettes   Smokeless tobacco: Never  Vaping Use   Vaping Use: Never used  Substance  and Sexual Activity   Alcohol use: Yes    Alcohol/week: 2.0 standard drinks    Types: 2 Cans of beer per week   Drug use: Yes    Types: Marijuana, MDMA (Ecstacy)   Sexual activity: Yes  Other Topics Concern   Not on file  Social History Narrative   Not on file   Social Determinants of Health   Financial Resource Strain: Not on file  Food Insecurity: Not on file  Transportation Needs: Not on file  Physical Activity: Not on file  Stress: Not on file  Social Connections: Not on file   Additional Social History:                         Sleep: Fair  Appetite:  Fair  Current Medications: Current Facility-Administered Medications  Medication Dose Route Frequency Provider Last Rate Last Admin   acetaminophen (TYLENOL) tablet 650 mg  650 mg Oral Once PRN Reed Breech, MD       Or   acetaminophen (TYLENOL) 160 MG/5ML solution 325-650 mg  325-650 mg Oral Q4H PRN Reed Breech, MD       acetaminophen (TYLENOL) tablet 650 mg  650 mg Oral Q6H PRN Charm Rings, NP   650 mg at 11/24/21 0848   alum & mag hydroxide-simeth (MAALOX/MYLANTA) 200-200-20 MG/5ML suspension 30 mL  30 mL Oral Q4H PRN Charm Rings, NP  buPROPion (WELLBUTRIN XL) 24 hr tablet 300 mg  300 mg Oral Daily Abegail Kloeppel T, MD   300 mg at 11/25/21 0847   fentaNYL (SUBLIMAZE) injection 25 mcg  25 mcg Intravenous Q5 min PRN Darleene CleaverVan Staveren, Gijsbertus F, MD       FLUoxetine (PROZAC) capsule 20 mg  20 mg Oral Daily Charm RingsLord, Jamison Y, NP   20 mg at 11/25/21 0846   ibuprofen (ADVIL) tablet 600 mg  600 mg Oral Q6H PRN Manette Doto, Jackquline DenmarkJohn T, MD   600 mg at 11/24/21 0848   magnesium hydroxide (MILK OF MAGNESIA) suspension 30 mL  30 mL Oral Daily PRN Charm RingsLord, Jamison Y, NP       midazolam (VERSED) injection 4 mg  4 mg Intravenous Once Jaequan Propes, Jackquline DenmarkJohn T, MD       midazolam (VERSED) injection 4 mg  4 mg Intravenous Once Zeppelin Beckstrand, Jackquline DenmarkJohn T, MD       nicotine (NICODERM CQ - dosed in mg/24 hours) patch 14 mg  14 mg Transdermal  Daily Coby Antrobus T, MD   14 mg at 11/25/21 0848   ondansetron (ZOFRAN) injection 4 mg  4 mg Intravenous Once PRN Darleene CleaverVan Staveren, Gijsbertus F, MD       QUEtiapine (SEROQUEL) tablet 300 mg  300 mg Oral QHS Lott Seelbach, Jackquline DenmarkJohn T, MD   300 mg at 11/24/21 2117    Lab Results:  Results for orders placed or performed during the hospital encounter of 11/01/21 (from the past 48 hour(s))  Glucose, capillary     Status: None   Collection Time: 11/25/21  6:24 AM  Result Value Ref Range   Glucose-Capillary 91 70 - 99 mg/dL    Comment: Glucose reference range applies only to samples taken after fasting for at least 8 hours.    Blood Alcohol level:  Lab Results  Component Value Date   ETH <10 10/31/2021   ETH <10 06/30/2021    Metabolic Disorder Labs: Lab Results  Component Value Date   HGBA1C 5.5 11/04/2021   MPG 111.15 11/04/2021   MPG 114 06/17/2021   Lab Results  Component Value Date   PROLACTIN 33.2 (H) 08/15/2016   Lab Results  Component Value Date   CHOL 149 11/04/2021   TRIG 56 11/04/2021   HDL 35 (L) 11/04/2021   CHOLHDL 4.3 11/04/2021   VLDL 11 11/04/2021   LDLCALC 103 (H) 11/04/2021   LDLCALC 111 (H) 06/17/2021    Physical Findings: AIMS:  , ,  ,  ,    CIWA:    COWS:     Musculoskeletal: Strength & Muscle Tone: within normal limits Gait & Station: normal Patient leans: N/A  Psychiatric Specialty Exam:  Presentation  General Appearance: Casual; Neat  Eye Contact:Fair  Speech:Slow  Speech Volume:Decreased  Handedness:Right   Mood and Affect  Mood:Anxious; Depressed  Affect:Constricted   Thought Process  Thought Processes:Linear  Descriptions of Associations:Circumstantial  Orientation:Full (Time, Place and Person)  Thought Content:Logical  History of Schizophrenia/Schizoaffective disorder:Yes  Duration of Psychotic Symptoms:Greater than six months  Hallucinations:No data recorded Ideas of Reference:None  Suicidal Thoughts:No data  recorded Homicidal Thoughts:No data recorded  Sensorium  Memory:Immediate Fair; Remote Fair  Judgment:Fair  Insight:Fair   Executive Functions  Concentration:Fair  Attention Span:Fair  Recall:Fair  Fund of Knowledge:Fair  Language:Fair   Psychomotor Activity  Psychomotor Activity:No data recorded  Assets  Assets:Communication Skills; Desire for Improvement; Physical Health   Sleep  Sleep:No data recorded   Physical Exam: Physical Exam Vitals and nursing note  reviewed.  Constitutional:      Appearance: Normal appearance.  HENT:     Head: Normocephalic and atraumatic.     Mouth/Throat:     Pharynx: Oropharynx is clear.  Eyes:     Pupils: Pupils are equal, round, and reactive to light.  Cardiovascular:     Rate and Rhythm: Normal rate and regular rhythm.  Pulmonary:     Effort: Pulmonary effort is normal.     Breath sounds: Normal breath sounds.  Abdominal:     General: Abdomen is flat.     Palpations: Abdomen is soft.  Musculoskeletal:        General: Normal range of motion.  Skin:    General: Skin is warm and dry.  Neurological:     General: No focal deficit present.     Mental Status: He is alert. Mental status is at baseline.  Psychiatric:        Mood and Affect: Mood normal.        Thought Content: Thought content normal.   Review of Systems  Constitutional: Negative.   HENT: Negative.    Eyes: Negative.   Respiratory: Negative.    Cardiovascular: Negative.   Gastrointestinal: Negative.   Musculoskeletal: Negative.   Skin: Negative.   Neurological: Negative.   Psychiatric/Behavioral: Negative.    Blood pressure 139/78, pulse 78, temperature (!) 97.5 F (36.4 C), resp. rate 18, height 6\' 3"  (1.905 m), weight 93.4 kg, SpO2 95 %. Body mass index is 25.74 kg/m.   Treatment Plan Summary: Medication management and Plan plan to reassess tomorrow.  This may be the end of the index course.  We will keep him on the schedule for Friday just in  case.  I am going to look into seeing if we can get a 30-day supply ordered up for him because he really would like to be discharged and we might get it done in the next 1 to 2 days.  Wednesday, MD 11/25/2021, 3:13 PM

## 2021-11-25 NOTE — Progress Notes (Signed)
Patient calm and pleasant during assessment, denies SI/HI/AVH. Pt compliant with medication administration per MD orders. Pt given education, support, and encouragement to be active in his treatment plan. Pt being monitored Q 15 minutes for safety per unit protocol. Pt remains safe on the unit. Pt stated that he is hopeful to discharge Friday

## 2021-11-25 NOTE — H&P (Signed)
Timothy [name] is an 26 y.o. male.   Chief Complaint: mood better HPI: recurrent severe depression  Past Medical History:  Diagnosis Date   Cannabis abuse    Paranoid schizophrenia (HCC)    Schizoaffective disorder, depressive type (HCC) 09/17/2014    History reviewed. No pertinent surgical history.  Family History  Problem Relation Age of Onset   Mental illness Brother    Drug abuse Brother    Mental illness Cousin    Suicidality Cousin    Diabetes Maternal Grandmother    Social History:  reports that he has been smoking cigarettes. He has been smoking an average of .25 packs per day. He has never used smokeless tobacco. He reports current alcohol use of about 2.0 standard drinks per week. He reports current drug use. Drugs: Marijuana and MDMA (Ecstacy).  Allergies:  Allergies  Allergen Reactions   Haldol [Haloperidol Lactate] Other (See Comments)    Muscle stiffness   Penicillins Hives    Felt like throat was closing Has patient had a PCN reaction causing immediate rash, facial/tongue/throat swelling, SOB or lightheadedness with hypotension: Unknown Has patient had a PCN reaction causing severe rash involving mucus membranes or skin necrosis: unknown Has patient had a PCN reaction that required hospitalization Unknown Has patient had a PCN reaction occurring within the last 10 years: Unknown If all of the above answers are "NO", then may proceed with Cephalosporin use.    Zyprexa [Olanzapine]     eps    Medications Prior to Admission  Medication Sig Dispense Refill   divalproex (DEPAKOTE) 500 MG DR tablet Take 1 tablet (500 mg total) by mouth every 12 (twelve) hours. 60 tablet 1   FLUoxetine (PROZAC) 20 MG capsule Take 1 capsule (20 mg total) by mouth daily. (Patient not taking: Reported on 07/02/2021) 30 capsule 1   QUEtiapine (SEROQUEL) 200 MG tablet Take 1 tablet (200 mg total) by mouth at bedtime. (Patient not taking: Reported on 07/02/2021) 30 tablet 0   QUEtiapine  (SEROQUEL) 50 MG tablet Take 1 tablet (50 mg total) by mouth 2 (two) times daily. (Patient not taking: Reported on 07/02/2021) 60 tablet 0    Results for orders placed or performed during the hospital encounter of 11/01/21 (from the past 48 hour(s))  Glucose, capillary     Status: None   Collection Time: 11/25/21  6:24 AM  Result Value Ref Range   Glucose-Capillary 91 70 - 99 mg/dL    Comment: Glucose reference range applies only to samples taken after fasting for at least 8 hours.   No results found.  Review of Systems  Constitutional: Negative.   HENT: Negative.    Eyes: Negative.   Respiratory: Negative.    Cardiovascular: Negative.   Gastrointestinal: Negative.   Musculoskeletal: Negative.   Skin: Negative.   Neurological: Negative.   Psychiatric/Behavioral: Negative.     Blood pressure 120/79, pulse 79, temperature 97.8 F (36.6 C), temperature source Tympanic, resp. rate 18, height 6\' 3"  (1.905 m), weight 93.4 kg, SpO2 100 %. Physical Exam Vitals and nursing note reviewed.  Constitutional:      Appearance: He is well-developed.  HENT:     Head: Normocephalic and atraumatic.  Eyes:     Conjunctiva/sclera: Conjunctivae normal.     Pupils: Pupils are equal, round, and reactive to light.  Cardiovascular:     Heart sounds: Normal heart sounds.  Pulmonary:     Effort: Pulmonary effort is normal.  Abdominal:     Palpations: Abdomen is  soft.  Musculoskeletal:        General: Normal range of motion.     Cervical back: Normal range of motion.  Skin:    General: Skin is warm and dry.  Neurological:     General: No focal deficit present.     Mental Status: He is alert.  Psychiatric:        Mood and Affect: Mood normal.        Thought Content: Thought content normal.     Assessment/Plan Nearing end of index  Timothy Rasmussen, MD 11/25/2021, 12:06 PM

## 2021-11-25 NOTE — Group Note (Signed)
[facility] LCSW Group Therapy Note   Group Date: 11/25/2021 Start Time: 1300 End Time: 1400   Type of Therapy/Topic:  Group Therapy:  Emotion Regulation  Participation Level:  Did Not Attend   Mood:  Description of Group:    The purpose of this group is to assist patients in learning to regulate negative emotions and experience positive emotions. Patients will be guided to discuss ways in which they have been vulnerable to their negative emotions. These vulnerabilities will be juxtaposed with experiences of positive emotions or situations, and patients challenged to use positive emotions to combat negative ones. Special emphasis will be placed on coping with negative emotions in conflict situations, and patients will process healthy conflict resolution skills.  Therapeutic Goals: Patient will identify two positive emotions or experiences to reflect on in order to balance out negative emotions:  Patient will label two or more emotions that they find the most difficult to experience:  Patient will be able to demonstrate positive conflict resolution skills through discussion or role plays:   Summary of Patient Progress:   X    Therapeutic Modalities:   Cognitive Behavioral Therapy Feelings Identification Dialectical Behavioral Therapy   Ryen Rhames J Jacquelyn Shadrick, LCSW 

## 2021-11-25 NOTE — Anesthesia Preprocedure Evaluation (Signed)
Anesthesia Evaluation  Patient identified by MRN, date of birth, ID band Patient awake    Reviewed: Allergy & Precautions, NPO status , Patient's Chart, lab work & pertinent test results  History of Anesthesia Complications Negative for: history of anesthetic complications  Airway Mallampati: II   Neck ROM: Full    Dental no notable dental hx.    Pulmonary Current Smoker and Patient abstained from smoking.,  Marijuana use   Pulmonary exam normal breath sounds clear to auscultation       Cardiovascular Exercise Tolerance: Good negative cardio ROS Normal cardiovascular exam Rhythm:Regular Rate:Normal     Neuro/Psych PSYCHIATRIC DISORDERS Depression Bipolar Disorder Schizophrenia negative neurological ROS     GI/Hepatic negative GI ROS,   Endo/Other  negative endocrine ROS  Renal/GU negative Renal ROS     Musculoskeletal   Abdominal   Peds  Hematology negative hematology ROS (+)   Anesthesia Other Findings   Reproductive/Obstetrics                             Anesthesia Physical  Anesthesia Plan  ASA: 3  Anesthesia Plan: General   Post-op Pain Management:    Induction: Intravenous  PONV Risk Score and Plan: 1 and TIVA and Treatment may vary due to age or medical condition  Airway Management Planned: Natural Airway  Additional Equipment:   Intra-op Plan:   Post-operative Plan:   Informed Consent: I have reviewed the patients History and Physical, chart, labs and discussed the procedure including the risks, benefits and alternatives for the proposed anesthesia with the patient or authorized representative who has indicated his/her understanding and acceptance.       Plan Discussed with: CRNA  Anesthesia Plan Comments: (LMA/GETA backup discussed.  Patient consented for risks of anesthesia including but not limited to:  - adverse reactions to medications - damage to  eyes, teeth, lips or other oral mucosa - nerve damage due to positioning  - sore throat or hoarseness - damage to heart, brain, nerves, lungs, other parts of body or loss of life  Informed patient about role of CRNA in peri- and intra-operative care.  Patient voiced understanding.)        Anesthesia Quick Evaluation

## 2021-11-25 NOTE — Plan of Care (Signed)
D: Pt alert and oriented. Pt rates depression 1/10, hopelessness 1/10, and anxiety 1/10. Pt goal: "Hoping treatment goes well and I will be able to go home." Pt reports energy level as low and concentration as being good. Pt reports sleep last night as being fair. Pt denies experiencing any pain at this time. Pt denies experiencing any SI/HI, or AVH at this time.   Pt tolerated ECT well today.  A: Scheduled medications administered to pt, per MD orders. Support and encouragement provided. Frequent verbal contact made. Routine safety checks conducted q15 minutes.   R: No adverse drug reactions noted. Pt verbally contracts for safety at this time. Pt compliant with medications. Pt interacts well with others on the unit. Pt remains safe at this time. Will continue to monitor.   Problem: Education: Goal: Emotional status will improve Outcome: Progressing Goal: Mental status will improve Description: Patient will became more alert no confusion to place and situation  Outcome: Progressing

## 2021-11-25 NOTE — Anesthesia Postprocedure Evaluation (Signed)
Anesthesia Post Note  Patient: Timothy [name]  Procedure(s) Performed: ECT TX  Patient location during evaluation: PACU Anesthesia Type: General Level of consciousness: awake and alert, oriented and patient cooperative Pain management: pain level controlled Vital Signs Assessment: post-procedure vital signs reviewed and stable Respiratory status: spontaneous breathing, nonlabored ventilation and respiratory function stable Cardiovascular status: blood pressure returned to baseline and stable Postop Assessment: adequate PO intake Anesthetic complications: no   No notable events documented.   Last Vitals:  Vitals:   11/25/21 1350 11/25/21 1400  BP: (!) 97/51 130/70  Pulse: 81 78  Resp: 17 16  Temp:    SpO2: 99% 95%    Last Pain:  Vitals:   11/25/21 1340  TempSrc:   PainSc: Asleep                 Reed Breech

## 2021-11-25 NOTE — Anesthesia Procedure Notes (Signed)
Date/Time: 11/25/2021 1:07 PM Performed by: Doreen Salvage, CRNA Pre-anesthesia Checklist: Patient identified, Emergency Drugs available, Suction available and Patient being monitored Patient Re-evaluated:Patient Re-evaluated prior to induction Oxygen Delivery Method: Circle system utilized Preoxygenation: Pre-oxygenation with 100% oxygen Induction Type: IV induction Ventilation: Mask ventilation without difficulty and Mask ventilation throughout procedure Airway Equipment and Method: Bite block Placement Confirmation: positive ETCO2 Dental Injury: Teeth and Oropharynx as per pre-operative assessment

## 2021-11-25 NOTE — Transfer of Care (Signed)
Immediate Anesthesia Transfer of Care Note  Patient: Timothy [name]  Procedure(s) Performed: ECT TX  Patient Location: PACU  Anesthesia Type:General  Level of Consciousness: drowsy  Airway & Oxygen Therapy: Patient Spontanous Breathing  Post-op Assessment: Report given to RN and Post -op Vital signs reviewed and stable  Post vital signs: Reviewed and stable  Last Vitals:  Vitals Value Taken Time  BP 153/78 11/25/21 1327  Temp    Pulse 83 11/25/21 1330  Resp 18 11/25/21 1330  SpO2 99 % 11/25/21 1330  Vitals shown include unvalidated device data.  Last Pain:  Vitals:   11/25/21 1056  TempSrc:   PainSc: 0-No pain      Patients Stated Pain Goal: 3 (11/18/21 0830)  Complications: No notable events documented.

## 2021-11-26 ENCOUNTER — Other Ambulatory Visit: Payer: Self-pay | Admitting: Psychiatry

## 2021-11-26 DIAGNOSIS — F250 Schizoaffective disorder, bipolar type: Secondary | ICD-10-CM | POA: Diagnosis not present

## 2021-11-26 NOTE — Progress Notes (Signed)
[facility] MD Progress Note  11/26/2021 10:59 AM ANIAS BARTOL  MRN:  [id number] Subjective: Patient seen for follow-up.  He was up out of bed early this morning neatly dressed and groomed.  Had a nice talk with him.  Patient says he feels like his mood is clearly better.  Denies having any hallucinations or suicidal thoughts in at least the last day.  He still seems quiet and a little subdued.  I talked with him about the way that ECT is dosed and how we would like to get maximum benefit prior to discharge but that I also am respectful of his desire to get discharge.  I asked him for his input about how he would like to proceed with the ECT plan. Principal Problem: Schizoaffective disorder, bipolar type (HCC) Diagnosis: Principal Problem:   Schizoaffective disorder, bipolar type (HCC)  Total Time spent with patient: 30 minutes  Past Psychiatric History: Past history of schizoaffective depressive time  Past Medical History:  Past Medical History:  Diagnosis Date   Cannabis abuse    Paranoid schizophrenia (HCC)    Schizoaffective disorder, depressive type (HCC) 09/17/2014   History reviewed. No pertinent surgical history. Family History:  Family History  Problem Relation Age of Onset   Mental illness Brother    Drug abuse Brother    Mental illness Cousin    Suicidality Cousin    Diabetes Maternal Grandmother    Family Psychiatric  History: See previous Social History:  Social History   Substance and Sexual Activity  Alcohol Use Yes   Alcohol/week: 2.0 standard drinks   Types: 2 Cans of beer per week     Social History   Substance and Sexual Activity  Drug Use Yes   Types: Marijuana, MDMA Chiropodist)    Social History   Socioeconomic History   Marital status: Single    Spouse name: Not on file   Number of children: Not on file   Years of education: Not on file   Highest education level: Not on file  Occupational History   Not on file  Tobacco Use   Smoking status: Some Days     Packs/day: 0.25    Types: Cigarettes   Smokeless tobacco: Never  Vaping Use   Vaping Use: Never used  Substance and Sexual Activity   Alcohol use: Yes    Alcohol/week: 2.0 standard drinks    Types: 2 Cans of beer per week   Drug use: Yes    Types: Marijuana, MDMA (Ecstacy)   Sexual activity: Yes  Other Topics Concern   Not on file  Social History Narrative   Not on file   Social Determinants of Health   Financial Resource Strain: Not on file  Food Insecurity: Not on file  Transportation Needs: Not on file  Physical Activity: Not on file  Stress: Not on file  Social Connections: Not on file   Additional Social History:                         Sleep: Fair  Appetite:  Fair  Current Medications: Current Facility-Administered Medications  Medication Dose Route Frequency Provider Last Rate Last Admin   acetaminophen (TYLENOL) tablet 650 mg  650 mg Oral Once PRN Reed Breech, MD       Or   acetaminophen (TYLENOL) 160 MG/5ML solution 325-650 mg  325-650 mg Oral Q4H PRN Reed Breech, MD       acetaminophen (TYLENOL) tablet 650 mg  650  mg Oral Q6H PRN Charm Rings, NP   650 mg at 11/24/21 0848   alum & mag hydroxide-simeth (MAALOX/MYLANTA) 200-200-20 MG/5ML suspension 30 mL  30 mL Oral Q4H PRN Charm Rings, NP       buPROPion (WELLBUTRIN XL) 24 hr tablet 300 mg  300 mg Oral Daily Jlen Wintle T, MD   300 mg at 11/26/21 0825   fentaNYL (SUBLIMAZE) injection 25 mcg  25 mcg Intravenous Q5 min PRN Darleene Cleaver, Gerrit Heck, MD       FLUoxetine (PROZAC) capsule 20 mg  20 mg Oral Daily Charm Rings, NP   20 mg at 11/26/21 0825   ibuprofen (ADVIL) tablet 600 mg  600 mg Oral Q6H PRN Unika Nazareno, Jackquline Denmark, MD   600 mg at 11/24/21 0848   magnesium hydroxide (MILK OF MAGNESIA) suspension 30 mL  30 mL Oral Daily PRN Charm Rings, NP       midazolam (VERSED) injection 4 mg  4 mg Intravenous Once Jarita Raval, Jackquline Denmark, MD       midazolam (VERSED) injection 4 mg  4 mg  Intravenous Once Jaryn Rosko, Jackquline Denmark, MD       nicotine (NICODERM CQ - dosed in mg/24 hours) patch 14 mg  14 mg Transdermal Daily Kazimierz Springborn T, MD   14 mg at 11/26/21 0829   ondansetron (ZOFRAN) injection 4 mg  4 mg Intravenous Once PRN Darleene Cleaver, Gerrit Heck, MD       QUEtiapine (SEROQUEL) tablet 300 mg  300 mg Oral QHS Taseen Marasigan, Jackquline Denmark, MD   300 mg at 11/25/21 2117    Lab Results:  Results for orders placed or performed during the hospital encounter of 11/01/21 (from the past 48 hour(s))  Glucose, capillary     Status: None   Collection Time: 11/25/21  6:24 AM  Result Value Ref Range   Glucose-Capillary 91 70 - 99 mg/dL    Comment: Glucose reference range applies only to samples taken after fasting for at least 8 hours.    Blood Alcohol level:  Lab Results  Component Value Date   ETH <10 10/31/2021   ETH <10 06/30/2021    Metabolic Disorder Labs: Lab Results  Component Value Date   HGBA1C 5.5 11/04/2021   MPG 111.15 11/04/2021   MPG 114 06/17/2021   Lab Results  Component Value Date   PROLACTIN 33.2 (H) 08/15/2016   Lab Results  Component Value Date   CHOL 149 11/04/2021   TRIG 56 11/04/2021   HDL 35 (L) 11/04/2021   CHOLHDL 4.3 11/04/2021   VLDL 11 11/04/2021   LDLCALC 103 (H) 11/04/2021   LDLCALC 111 (H) 06/17/2021    Physical Findings: AIMS:  , ,  ,  ,    CIWA:    COWS:     Musculoskeletal: Strength & Muscle Tone: within normal limits Gait & Station: normal Patient leans: N/A  Psychiatric Specialty Exam:  Presentation  General Appearance: Casual; Neat  Eye Contact:Fair  Speech:Slow  Speech Volume:Decreased  Handedness:Right   Mood and Affect  Mood:Anxious; Depressed  Affect:Constricted   Thought Process  Thought Processes:Linear  Descriptions of Associations:Circumstantial  Orientation:Full (Time, Place and Person)  Thought Content:Logical  History of Schizophrenia/Schizoaffective disorder:Yes  Duration of Psychotic  Symptoms:Greater than six months  Hallucinations:No data recorded Ideas of Reference:None  Suicidal Thoughts:No data recorded Homicidal Thoughts:No data recorded  Sensorium  Memory:Immediate Fair; Remote Fair  Judgment:Fair  Insight:Fair   Executive Functions  Concentration:Fair  Attention Span:Fair  Recall:Fair  Fund of Knowledge:Fair  Language:Fair   Psychomotor Activity  Psychomotor Activity:No data recorded  Assets  Assets:Communication Skills; Desire for Improvement; Physical Health   Sleep  Sleep:No data recorded   Physical Exam: Physical Exam Vitals and nursing note reviewed.  Constitutional:      Appearance: Normal appearance.  HENT:     Head: Normocephalic and atraumatic.     Mouth/Throat:     Pharynx: Oropharynx is clear.  Eyes:     Pupils: Pupils are equal, round, and reactive to light.  Cardiovascular:     Rate and Rhythm: Normal rate and regular rhythm.  Pulmonary:     Effort: Pulmonary effort is normal.     Breath sounds: Normal breath sounds.  Abdominal:     General: Abdomen is flat.     Palpations: Abdomen is soft.  Musculoskeletal:        General: Normal range of motion.  Skin:    General: Skin is warm and dry.  Neurological:     General: No focal deficit present.     Mental Status: He is alert. Mental status is at baseline.  Psychiatric:        Attention and Perception: Attention normal.        Mood and Affect: Mood normal.        Speech: Speech is delayed.        Behavior: Behavior is slowed.        Thought Content: Thought content normal. Thought content does not include suicidal ideation.        Cognition and Memory: Memory is impaired.   Review of Systems  Constitutional: Negative.   HENT: Negative.    Eyes: Negative.   Respiratory: Negative.    Cardiovascular: Negative.   Gastrointestinal: Negative.   Musculoskeletal: Negative.   Skin: Negative.   Neurological: Negative.   Psychiatric/Behavioral:  Positive for  memory loss. Negative for depression, hallucinations, substance abuse and suicidal ideas. The patient is not nervous/anxious.   Blood pressure 123/73, pulse 79, temperature (!) 97.4 F (36.3 C), temperature source Oral, resp. rate 19, height 6\' 3"  (1.905 m), weight 93.4 kg, SpO2 100 %. Body mass index is 25.74 kg/m.   Treatment Plan Summary: Medication management and Plan no change to medicine management.  After discussing the options and me making it clear that I wanted to respect his preferences the patient said that he would like to have ECT on Friday.  He would like to see if there is a possibility he might feel even better.  We can then consider possible discharge after the weekend.  I told him I thought that this was a good decision and makes sense.  No change to treatment for now except to keep him on the schedule for Friday.  Mordecai RasmussenJohn Trany Chernick, MD 11/26/2021, 10:59 AM

## 2021-11-26 NOTE — Plan of Care (Signed)
  Problem: Coping Skills Goal: STG - Patient will identify 3 positive coping skills strategies to use post d/c within 5 recreation therapy group sessions Description: STG - Patient will identify 3 positive coping skills strategies to use post d/c within 5 recreation therapy group sessions Outcome: Progressing   

## 2021-11-26 NOTE — Group Note (Signed)
[facility] LCSW Group Therapy Note   Group Date: 11/26/2021 Start Time: 1300 End Time: 1400   Type of Therapy/Topic:  Group Therapy:  Balance in Life  Participation Level:  Active   Description of Group:    This group will address the concept of balance and how it feels and looks when one is unbalanced. Patients will be encouraged to process areas in their lives that are out of balance, and identify reasons for remaining unbalanced. Facilitators will guide patients utilizing problem- solving interventions to address and correct the stressor making their life unbalanced. Understanding and applying boundaries will be explored and addressed for obtaining  and maintaining a balanced life. Patients will be encouraged to explore ways to assertively make their unbalanced needs known to significant others in their lives, using other group members and facilitator for support and feedback.  Therapeutic Goals: Patient will identify two or more emotions or situations they have that consume much of in their lives. Patient will identify signs/triggers that life has become out of balance:  Patient will identify two ways to set boundaries in order to achieve balance in their lives:  Patient will demonstrate ability to communicate their needs through discussion and/or role plays  Summary of Patient Progress: Patient was present for the entirety of the meeting. He was involved in the conversation, identifying isolation and lack of a support system/friends as something causes him to become off balance. Pt shared that he knows that things are beginning to become off balanced when he notices that he is not doing the things that he usually does. He stated goals to reconnect with his journaling practice, get out of the house, play basketball and video games, and working out. Pt also spoke about needing to reconnect with a therapist because when he was receiving this in the past he felt better about himself and was in a  better place. His comments were appropriate for the group. Pt was open and receptive to feedback and comments from peers and facilitator.   Therapeutic Modalities:   Cognitive Behavioral Therapy Solution-Focused Therapy Assertiveness Training   [name], LCSW

## 2021-11-26 NOTE — Progress Notes (Addendum)
Recreation Therapy Notes  Date: 11/26/2021   Time: 10:30 am   Location: Courtyard      Behavioral response: Appropriate   Intervention Topic: Wellness    Discussion/Intervention:  Group content today was focused on Wellness. The group defined wellness and some positive ways they make decisions for themselves. Individuals expressed reasons why they neglected any wellness in the past. Patients described ways to improve wellness skills in the future. The group explained what could happen if they did not do any wellness at all. Participants express how bad choices has affected them and others around them. Individual explained the importance of wellness. The group participated in the intervention "Testing my Wellness" where they had a chance to identify some of their weaknesses and strengths in wellness.  Clinical Observations/Feedback: Patient came to group and was able to explore and identify wellness activities that they can use outside of the hospital. Individual was social with peers and staff while participating in the intervention.    Demarea Lorey LRT/CTRS         Mikeyla Music 11/26/2021 12:16 PM

## 2021-11-26 NOTE — Plan of Care (Signed)
D: Pt alert and oriented. Pt rates depression 1/10, hopelessness 2/10, and anxiety 0/10. Pt reports energy level as normal and concentration as being good. Pt reports sleep last night as being good. Pt did receive medications for sleep and did find them helpful. Pt denies experiencing any pain at this time. Pt denies experiencing any SI/HI, or AVH at this time.   A: Scheduled medications administered to pt, per MD orders. Support and encouragement provided. Frequent verbal contact made. Routine safety checks conducted q15 minutes.   R: No adverse drug reactions noted. Pt verbally contracts for safety at this time. Pt compliant with medications. Pt interacts well with others on the unit. Pt remains safe at this time. Will continue to monitor.   Problem: Activity: Goal: Interest or engagement in activities will improve Outcome: Progressing Goal: Sleeping patterns will improve Outcome: Progressing

## 2021-11-27 ENCOUNTER — Inpatient Hospital Stay: Payer: 59 | Admitting: Anesthesiology

## 2021-11-27 ENCOUNTER — Encounter: Payer: Self-pay | Admitting: Psychiatry

## 2021-11-27 ENCOUNTER — Other Ambulatory Visit: Payer: Self-pay

## 2021-11-27 ENCOUNTER — Ambulatory Visit: Payer: 59

## 2021-11-27 DIAGNOSIS — F250 Schizoaffective disorder, bipolar type: Secondary | ICD-10-CM | POA: Diagnosis not present

## 2021-11-27 LAB — GLUCOSE, CAPILLARY: Glucose-Capillary: 93 mg/dL (ref 70–99)

## 2021-11-27 MED ORDER — KETOROLAC TROMETHAMINE 30 MG/ML IJ SOLN
INTRAMUSCULAR | Status: AC
Start: 2021-11-27 — End: 2021-11-27
  Administered 2021-11-27: 30 mg via INTRAVENOUS
  Filled 2021-11-27: qty 1

## 2021-11-27 MED ORDER — MIDAZOLAM HCL 2 MG/2ML IJ SOLN
INTRAMUSCULAR | Status: AC
  Filled 2021-11-27: qty 2

## 2021-11-27 MED ORDER — ONDANSETRON HCL 4 MG/2ML IJ SOLN
4.0000 mg | Freq: Once | INTRAMUSCULAR | Status: DC | PRN
Start: 2021-11-27 — End: 2021-12-01

## 2021-11-27 MED ORDER — QUETIAPINE FUMARATE 300 MG PO TABS
300.0000 mg | ORAL_TABLET | Freq: Every day | ORAL | 1 refills | Status: DC
Start: 2021-11-27 — End: 2021-12-01

## 2021-11-27 MED ORDER — ACETAMINOPHEN 325 MG PO TABS: 650.0000 mg | ORAL_TABLET | Freq: Once | ORAL | Status: DC | PRN

## 2021-11-27 MED ORDER — ACETAMINOPHEN 160 MG/5ML PO SOLN
325.0000 mg | ORAL | Status: DC | PRN
  Filled 2021-11-27: qty 20.3

## 2021-11-27 MED ORDER — SODIUM CHLORIDE 0.9 % IV SOLN: INTRAVENOUS | Status: DC | PRN

## 2021-11-27 MED ORDER — FLUOXETINE HCL 20 MG PO CAPS: 20.0000 mg | ORAL_CAPSULE | Freq: Every day | ORAL | 1 refills | Status: DC

## 2021-11-27 MED ORDER — MIDAZOLAM HCL 2 MG/2ML IJ SOLN
INTRAMUSCULAR | Status: AC
Start: 2021-11-27 — End: ?
  Filled 2021-11-27: qty 2

## 2021-11-27 MED ORDER — GLYCOPYRROLATE 0.2 MG/ML IJ SOLN: 0.6000 mg | Freq: Once | INTRAMUSCULAR | Status: AC

## 2021-11-27 MED ORDER — DEXMEDETOMIDINE (PRECEDEX) IN NS 20 MCG/5ML (4 MCG/ML) IV SYRINGE
PREFILLED_SYRINGE | INTRAVENOUS | Status: DC | PRN
  Administered 2021-11-27: 20 ug via INTRAVENOUS

## 2021-11-27 MED ORDER — QUETIAPINE FUMARATE 50 MG PO TABS: 50.0000 mg | ORAL_TABLET | Freq: Two times a day (BID) | ORAL | 1 refills | Status: DC

## 2021-11-27 MED ORDER — GLYCOPYRROLATE 0.2 MG/ML IJ SOLN
INTRAMUSCULAR | Status: AC
  Administered 2021-11-27: 0.6 mg via INTRAVENOUS
  Filled 2021-11-27: qty 3

## 2021-11-27 MED ORDER — SODIUM CHLORIDE 0.9 % IV SOLN
500.0000 mL | Freq: Once | INTRAVENOUS | Status: AC
  Administered 2021-11-27: 500 mL via INTRAVENOUS

## 2021-11-27 MED ORDER — MIDAZOLAM HCL 2 MG/2ML IJ SOLN: 4.0000 mg | Freq: Once | INTRAMUSCULAR | Status: DC

## 2021-11-27 MED ORDER — KETOROLAC TROMETHAMINE 30 MG/ML IJ SOLN: 30.0000 mg | Freq: Once | INTRAMUSCULAR | Status: AC

## 2021-11-27 MED ORDER — MIDAZOLAM HCL 2 MG/2ML IJ SOLN
INTRAMUSCULAR | Status: DC | PRN
  Administered 2021-11-27: 4 mg via INTRAVENOUS

## 2021-11-27 MED ORDER — METHOHEXITAL SODIUM 100 MG/10ML IV SOSY
PREFILLED_SYRINGE | INTRAVENOUS | Status: DC | PRN
  Administered 2021-11-27: 100 mg via INTRAVENOUS

## 2021-11-27 MED ORDER — SUCCINYLCHOLINE CHLORIDE 200 MG/10ML IV SOSY
PREFILLED_SYRINGE | INTRAVENOUS | Status: DC | PRN
Start: 2021-11-27 — End: 2021-11-27
  Administered 2021-11-27: 250 mg via INTRAVENOUS

## 2021-11-27 NOTE — Transfer of Care (Signed)
Immediate Anesthesia Transfer of Care Note  Patient: Timothy [name]  Procedure(s) Performed: ECT TX  Patient Location: PACU  Anesthesia Type:General  Level of Consciousness: drowsy  Airway & Oxygen Therapy: Patient Spontanous Breathing and Patient connected to face mask oxygen  Post-op Assessment: Report given to RN and Post -op Vital signs reviewed and stable  Post vital signs: Reviewed and stable  Last Vitals:  Vitals Value Taken Time  BP 145/85 11/27/21 1320  Temp 36.6 C 11/27/21 1318  Pulse 92 11/27/21 1324  Resp 18 11/27/21 1324  SpO2 100 % 11/27/21 1324  Vitals shown include unvalidated device data.  Last Pain:  Vitals:   11/27/21 1318  TempSrc:   PainSc: Asleep      Patients Stated Pain Goal: 2 (11/26/21 1940)  Complications: No notable events documented.

## 2021-11-27 NOTE — Plan of Care (Signed)
D- Patient alert and oriented. Patient presents in a pleasant mood on assessment stating that he slept good last night and had no complaints to voice to this Clinical research associate. Patient denies SI, HI, AVH, and pain at this time. Patient also denies any signs/symptoms of depression/anxiety, reporting that overall, he is feeling "pretty good". Patient had no stated goals for today.  A- Scheduled medications administered to patient, per MD orders. Support and encouragement provided.  Routine safety checks conducted every 15 minutes.  Patient informed to notify staff with problems or concerns.  R- No adverse drug reactions noted. Patient contracts for safety at this time. Patient compliant with medications and treatment plan. Patient receptive, calm, and cooperative. Patient interacts well with others on the unit. Patient remains safe at this time.  Problem: Education: Goal: Knowledge of [facility] Education information/materials will improve Outcome: Progressing Goal: Emotional status will improve Outcome: Progressing Goal: Mental status will improve Description: Patient will became more alert no confusion to place and situation  Outcome: Progressing Goal: Verbalization of understanding the information provided will improve Outcome: Progressing   Problem: Activity: Goal: Interest or engagement in activities will improve Outcome: Progressing Goal: Sleeping patterns will improve Outcome: Progressing   Problem: Coping: Goal: Ability to verbalize frustrations and anger appropriately will improve Description: Patient will verbalize frustration and anger to the staff appropriately.  Outcome: Progressing Goal: Ability to demonstrate self-control will improve Outcome: Progressing   Problem: Health Behavior/Discharge Planning: Goal: Identification of resources available to assist in meeting health care needs will improve Outcome: Progressing Goal: Compliance with treatment plan for underlying cause  of condition will improve Outcome: Progressing   Problem: Physical Regulation: Goal: Ability to maintain clinical measurements within normal limits will improve Outcome: Progressing   Problem: Safety: Goal: Periods of time without injury will increase Outcome: Progressing   Problem: Activity: Goal: Will verbalize the importance of balancing activity with adequate rest periods Outcome: Progressing   Problem: Education: Goal: Will be free of psychotic symptoms Description: Patient will be free of psychosis  Outcome: Progressing Goal: Knowledge of the prescribed therapeutic regimen will improve Outcome: Progressing   Problem: Coping: Goal: Coping ability will improve Outcome: Progressing Goal: Will verbalize feelings Outcome: Progressing   Problem: Health Behavior/Discharge Planning: Goal: Compliance with prescribed medication regimen will improve Outcome: Progressing   Problem: Nutritional: Goal: Ability to achieve adequate nutritional intake will improve Outcome: Progressing   Problem: Role Relationship: Goal: Ability to communicate needs accurately will improve Outcome: Progressing Goal: Ability to interact with others will improve Outcome: Progressing   Problem: Safety: Goal: Ability to redirect hostility and anger into socially appropriate behaviors will improve Outcome: Progressing Goal: Ability to remain free from injury will improve Outcome: Progressing   Problem: Self-Care: Goal: Ability to participate in self-care as condition permits will improve Outcome: Progressing   Problem: Self-Concept: Goal: Will verbalize positive feelings about self Outcome: Progressing

## 2021-11-27 NOTE — Procedures (Signed)
ECT SERVICES Physician's Interval Evaluation & Treatment Note  Patient Identification: MAXWEL MEADOWCROFT MRN:  [id number] Date of Evaluation:  11/27/2021 TX #: [id number]  MADRS:   MMSE:   P.E. Findings:  No change to physical exam  Psychiatric Interval Note:  Feeling much better.  Talking spontaneously thinking about the future no suicidal thoughts or hallucinations  Subjective:  Patient is a 26 y.o. male seen for evaluation for Electroconvulsive Therapy. Feeling better overall  Treatment Summary:   []   Right Unilateral             [x]  Bilateral   % Energy : 1.0 ms 40%   Impedance: 1150 ohms  Seizure Energy Index: 8789 V squared  Postictal Suppression Index: No reading  Seizure Concordance Index: 96%  Medications  Pre Shock: Robinul 0.6 mg Toradol 30 mg Brevital 100 mg succinylcholine 250 mg  Post Shock: Versed 4 mg  Seizure Duration: EMG 37 seconds EEG 64 seconds   Comments: I anticipate this to be the last treatment as he is doing much better and we plan for discharge after the weekend  Lungs:  [x]   Clear to auscultation               []  Other:   Heart:    [x]   Regular rhythm             []  irregular rhythm    [x]   Previous H&P reviewed, patient examined and there are NO CHANGES                 []   Previous H&P reviewed, patient examined and there are changes noted.   , MD 5/26/20232:40 PM

## 2021-11-27 NOTE — Progress Notes (Signed)
Patient was given his scheduled morning medication, with a few sips of water. MD will be notified.

## 2021-11-27 NOTE — BH IP Treatment Plan (Signed)
Interdisciplinary Treatment and Diagnostic Plan Update  11/27/2021 Time of Session: 8:30 AM HENDRIXX SEVERIN MRN: [id number]  Principal Diagnosis: Schizoaffective disorder, bipolar type (HCC)  Secondary Diagnoses: Principal Problem:   Schizoaffective disorder, bipolar type (HCC)   Current Medications:  Current Facility-Administered Medications  Medication Dose Route Frequency Provider Last Rate Last Admin   acetaminophen (TYLENOL) tablet 650 mg  650 mg Oral Once PRN Reed Breech, MD       Or   acetaminophen (TYLENOL) 160 MG/5ML solution 325-650 mg  325-650 mg Oral Q4H PRN Reed Breech, MD       acetaminophen (TYLENOL) tablet 650 mg  650 mg Oral Q6H PRN Charm Rings, NP   650 mg at 11/26/21 1940   alum & mag hydroxide-simeth (MAALOX/MYLANTA) 200-200-20 MG/5ML suspension 30 mL  30 mL Oral Q4H PRN Charm Rings, NP       buPROPion (WELLBUTRIN XL) 24 hr tablet 300 mg  300 mg Oral Daily Clapacs, John T, MD   300 mg at 11/27/21 0755   fentaNYL (SUBLIMAZE) injection 25 mcg  25 mcg Intravenous Q5 min PRN Darleene Cleaver, Gerrit Heck, MD       FLUoxetine (PROZAC) capsule 20 mg  20 mg Oral Daily Charm Rings, NP   20 mg at 11/27/21 0755   ibuprofen (ADVIL) tablet 600 mg  600 mg Oral Q6H PRN Clapacs, Jackquline Denmark, MD   600 mg at 11/26/21 1940   magnesium hydroxide (MILK OF MAGNESIA) suspension 30 mL  30 mL Oral Daily PRN Charm Rings, NP       midazolam (VERSED) injection 4 mg  4 mg Intravenous Once Clapacs, Jackquline Denmark, MD       midazolam (VERSED) injection 4 mg  4 mg Intravenous Once Clapacs, Jackquline Denmark, MD       nicotine (NICODERM CQ - dosed in mg/24 hours) patch 14 mg  14 mg Transdermal Daily Clapacs, Jackquline Denmark, MD   14 mg at 11/27/21 0756   ondansetron (ZOFRAN) injection 4 mg  4 mg Intravenous Once PRN Darleene Cleaver, Gerrit Heck, MD       QUEtiapine (SEROQUEL) tablet 300 mg  300 mg Oral QHS Clapacs, John T, MD   300 mg at 11/26/21 2245   PTA Medications: Medications Prior to Admission   Medication Sig Dispense Refill Last Dose   divalproex (DEPAKOTE) 500 MG DR tablet Take 1 tablet (500 mg total) by mouth every 12 (twelve) hours. 60 tablet 1 11/12/2021   QUEtiapine (SEROQUEL) 200 MG tablet Take 1 tablet (200 mg total) by mouth at bedtime. (Patient not taking: Reported on 07/02/2021) 30 tablet 0    [DISCONTINUED] FLUoxetine (PROZAC) 20 MG capsule Take 1 capsule (20 mg total) by mouth daily. (Patient not taking: Reported on 07/02/2021) 30 capsule 1    [DISCONTINUED] QUEtiapine (SEROQUEL) 50 MG tablet Take 1 tablet (50 mg total) by mouth 2 (two) times daily. (Patient not taking: Reported on 07/02/2021) 60 tablet 0     Patient Stressors: Medication change or noncompliance   Substance abuse    Patient Strengths: Ability for insight  Motivation for treatment/growth   Treatment Modalities: Medication Management, Group therapy, Case management,  1 to 1 session with clinician, Psychoeducation, Recreational therapy.   Physician Treatment Plan for Primary Diagnosis: Schizoaffective disorder, bipolar type (HCC) Long Term Goal(s): Improvement in symptoms so as ready for discharge   Short Term Goals: Ability to identify changes in lifestyle to reduce recurrence of condition will improve Ability to verbalize feelings  will improve Ability to disclose and discuss suicidal ideas Ability to demonstrate self-control will improve Ability to identify and develop effective coping behaviors will improve Ability to maintain clinical measurements within normal limits will improve Compliance with prescribed medications will improve Ability to identify triggers associated with substance abuse/mental health issues will improve  Medication Management: Evaluate patient's response, side effects, and tolerance of medication regimen.  Therapeutic Interventions: 1 to 1 sessions, Unit Group sessions and Medication administration.  Evaluation of Outcomes: Progressing  Physician Treatment Plan for  Secondary Diagnosis: Principal Problem:   Schizoaffective disorder, bipolar type (HCC)  Long Term Goal(s): Improvement in symptoms so as ready for discharge   Short Term Goals: Ability to identify changes in lifestyle to reduce recurrence of condition will improve Ability to verbalize feelings will improve Ability to disclose and discuss suicidal ideas Ability to demonstrate self-control will improve Ability to identify and develop effective coping behaviors will improve Ability to maintain clinical measurements within normal limits will improve Compliance with prescribed medications will improve Ability to identify triggers associated with substance abuse/mental health issues will improve     Medication Management: Evaluate patient's response, side effects, and tolerance of medication regimen.  Therapeutic Interventions: 1 to 1 sessions, Unit Group sessions and Medication administration.  Evaluation of Outcomes: Progressing   RN Treatment Plan for Primary Diagnosis: Schizoaffective disorder, bipolar type (HCC) Long Term Goal(s): Knowledge of disease and therapeutic regimen to maintain health will improve  Short Term Goals: Ability to remain free from injury will improve, Ability to verbalize frustration and anger appropriately will improve, Ability to demonstrate self-control, Ability to participate in decision making will improve, Ability to verbalize feelings will improve, Ability to disclose and discuss suicidal ideas, Ability to identify and develop effective coping behaviors will improve, and Compliance with prescribed medications will improve  Medication Management: RN will administer medications as ordered by provider, will assess and evaluate patient's response and provide education to patient for prescribed medication. RN will report any adverse and/or side effects to prescribing provider.  Therapeutic Interventions: 1 on 1 counseling sessions, Psychoeducation, Medication  administration, Evaluate responses to treatment, Monitor vital signs and CBGs as ordered, Perform/monitor CIWA, COWS, AIMS and Fall Risk screenings as ordered, Perform wound care treatments as ordered.  Evaluation of Outcomes: Progressing   LCSW Treatment Plan for Primary Diagnosis: Schizoaffective disorder, bipolar type (HCC) Long Term Goal(s): Safe transition to appropriate next level of care at discharge, Engage patient in therapeutic group addressing interpersonal concerns.  Short Term Goals: Engage patient in aftercare planning with referrals and resources, Increase social support, Increase ability to appropriately verbalize feelings, Increase emotional regulation, Facilitate acceptance of mental health diagnosis and concerns, Facilitate patient progression through stages of change regarding substance use diagnoses and concerns, Identify triggers associated with mental health/substance abuse issues, and Increase skills for wellness and recovery  Therapeutic Interventions: Assess for all discharge needs, 1 to 1 time with Social worker, Explore available resources and support systems, Assess for adequacy in community support network, Educate family and significant other(s) on suicide prevention, Complete Psychosocial Assessment, Interpersonal group therapy.  Evaluation of Outcomes: Progressing   Progress in Treatment: Attending groups: Yes. Participating in groups: Yes. Taking medication as prescribed: Yes. Toleration medication: Yes. Family/Significant other contact made: No, will contact:  if given permission.  Patient understands diagnosis: Yes. Discussing patient identified problems/goals with staff: Yes. Medical problems stabilized or resolved: Yes. Denies suicidal/homicidal ideation: Yes. Issues/concerns per patient self-inventory: No. Other: none.  New problem(s) identified: No, Describe:  none identified  New Short Term/Long Term Goal(s): Patient to work towards elimination  of symptoms of psychosis, medication management for mood stabilization; elimination of SI thoughts; development of comprehensive mental wellness plan.Update 11/22/2021: No changes at this time. Update 11/27/21: No changes at this time.    Patient Goals:  No additional goals identified at this time. Patient to continue to work towards original goals identified in initial treatment team meeting. CSW will remain available to patient should they voice additional treatment goals. Update 11/22/2021: No changes at this time. Update 11/27/21: No change at this time.    Discharge Plan or Barriers: Patient continues to lack stable housing. Patient likely to discharge to shelter once psychiatrically stable. Update 11/22/2021: No changes at this time. Update 11/27/21: No changes at this time.    Reason for Continuation of Hospitalization: Depression Hallucinations Medication stabilization Suicidal ideation   Estimated Length of Stay: TBD  Last 3 [place] Suicide Severity Risk Score: Flowsheet Row Admission (Current) from 11/01/2021 in [facility]INE Most recent reading at 11/25/2021 10:54 AM ECT Treatment from 11/20/2021 in [facility] Most recent reading at 11/20/2021 12:11 PM ECT Treatment from 11/18/2021 in [facility] Most recent reading at 11/18/2021 11:40 AM  C-SSRS RISK CATEGORY No Risk Error: Question 2 not populated No Risk       Last PHQ 2/9 Scores:    12/31/2019   10:13 AM  Depression screen PHQ 2/9  Decreased Interest 2  Down, Depressed, Hopeless 1  PHQ - 2 Score 3  Altered sleeping 3  Tired, decreased energy 3  Trouble concentrating 1  Moving slowly or fidgety/restless 2  Suicidal thoughts 2  PHQ-9 Score 14  Difficult doing work/chores Very difficult    Scribe for Treatment Team: [name], LCSW 11/27/2021 8:41 AM

## 2021-11-27 NOTE — Progress Notes (Signed)
Transport just arrived to take patient up to ECT.

## 2021-11-27 NOTE — Progress Notes (Signed)
   11/27/21 1545  Post ECT Assessment  Level of Consciousness Alert  Orientation Level Oriented X4  Orientation Assessment First & Last Name;Year of birth;Year;Month;Name of this hospital  Reoriented No (Comment)  Patient Ready to Eat/Drink Yes  Meal Held No   Patient Ready to Ambulate Yes  Ambulation Level of Assist Independent  Able to Void Yes  Vital Signs  Temp 97.6 F (36.4 C)  Temp Source Oral  Pulse Rate (!) 108  Pulse Rate Source Monitor  Resp 18  BP 132/83  BP Location Right Arm  BP Method Automatic  Patient Position (if appropriate) Standing  Pain Assessment  Pain Scale 0-10  Pain Score 0

## 2021-11-27 NOTE — H&P (Signed)
Timothy [name] is an 26 y.o. male.   Chief Complaint: Mood is much better.  Not depressed.  Denies hallucinations or paranoia HPI: Recurrent schizoaffective depressive type  Past Medical History:  Diagnosis Date   Cannabis abuse    Paranoid schizophrenia (HCC)    Schizoaffective disorder, depressive type (HCC) 09/17/2014    History reviewed. No pertinent surgical history.  Family History  Problem Relation Age of Onset   Mental illness Brother    Drug abuse Brother    Mental illness Cousin    Suicidality Cousin    Diabetes Maternal Grandmother    Social History:  reports that he has been smoking cigarettes. He has been smoking an average of .25 packs per day. He has never used smokeless tobacco. He reports current alcohol use of about 2.0 standard drinks per week. He reports current drug use. Drugs: Marijuana and MDMA (Ecstacy).  Allergies:  Allergies  Allergen Reactions   Haldol [Haloperidol Lactate] Other (See Comments)    Muscle stiffness   Penicillins Hives    Felt like throat was closing Has patient had a PCN reaction causing immediate rash, facial/tongue/throat swelling, SOB or lightheadedness with hypotension: Unknown Has patient had a PCN reaction causing severe rash involving mucus membranes or skin necrosis: unknown Has patient had a PCN reaction that required hospitalization Unknown Has patient had a PCN reaction occurring within the last 10 years: Unknown If all of the above answers are "NO", then may proceed with Cephalosporin use.    Zyprexa [Olanzapine]     eps    Medications Prior to Admission  Medication Sig Dispense Refill   divalproex (DEPAKOTE) 500 MG DR tablet Take 1 tablet (500 mg total) by mouth every 12 (twelve) hours. 60 tablet 1   QUEtiapine (SEROQUEL) 200 MG tablet Take 1 tablet (200 mg total) by mouth at bedtime. (Patient not taking: Reported on 07/02/2021) 30 tablet 0   [DISCONTINUED] FLUoxetine (PROZAC) 20 MG capsule Take 1 capsule (20 mg total)  by mouth daily. (Patient not taking: Reported on 07/02/2021) 30 capsule 1   [DISCONTINUED] QUEtiapine (SEROQUEL) 50 MG tablet Take 1 tablet (50 mg total) by mouth 2 (two) times daily. (Patient not taking: Reported on 07/02/2021) 60 tablet 0    Results for orders placed or performed during the hospital encounter of 11/01/21 (from the past 48 hour(s))  Glucose, capillary     Status: None   Collection Time: 11/27/21  6:16 AM  Result Value Ref Range   Glucose-Capillary 93 70 - 99 mg/dL    Comment: Glucose reference range applies only to samples taken after fasting for at least 8 hours.   No results found.  Review of Systems  Constitutional: Negative.   HENT: Negative.    Eyes: Negative.   Respiratory: Negative.    Cardiovascular: Negative.   Gastrointestinal: Negative.   Musculoskeletal: Negative.   Skin: Negative.   Neurological: Negative.   Psychiatric/Behavioral: Negative.    All other systems reviewed and are negative.  Blood pressure 139/76, pulse 96, temperature 98.1 F (36.7 C), temperature source Temporal, resp. rate 18, height 6\' 3"  (1.905 m), weight 93.8 kg, SpO2 100 %. Physical Exam Vitals and nursing note reviewed.  Constitutional:      Appearance: He is well-developed.  HENT:     Head: Normocephalic and atraumatic.  Eyes:     Conjunctiva/sclera: Conjunctivae normal.     Pupils: Pupils are equal, round, and reactive to light.  Cardiovascular:     Heart sounds: Normal heart sounds.  Pulmonary:     Effort: Pulmonary effort is normal.  Abdominal:     Palpations: Abdomen is soft.  Musculoskeletal:        General: Normal range of motion.     Cervical back: Normal range of motion.  Skin:    General: Skin is warm and dry.  Neurological:     General: No focal deficit present.     Mental Status: He is alert.  Psychiatric:        Mood and Affect: Mood normal.     Assessment/Plan Last ECT treatment today probably as he seems to be reaching a baseline.  Mordecai Rasmussen, MD 11/27/2021, 2:30 PM

## 2021-11-27 NOTE — Group Note (Signed)
[facility] LCSW Group Therapy Note   Group Date: 11/27/2021 Start Time: 1300 End Time: 1400  Type of Therapy and Topic:  Group Therapy:  Feelings around Relapse and Recovery  Participation Level:  Did Not Attend   Mood:  Description of Group:    Patients in this group will discuss emotions they experience before and after a relapse. They will process how experiencing these feelings, or avoidance of experiencing them, relates to having a relapse. Facilitator will guide patients to explore emotions they have related to recovery. Patients will be encouraged to process which emotions are more powerful. They will be guided to discuss the emotional reaction significant others in their lives may have to patients' relapse or recovery. Patients will be assisted in exploring ways to respond to the emotions of others without this contributing to a relapse.  Therapeutic Goals: Patient will identify two or more emotions that lead to relapse for them:  Patient will identify two emotions that result when they relapse:  Patient will identify two emotions related to recovery:  Patient will demonstrate ability to communicate their needs through discussion and/or role plays.   Summary of Patient Progress:  X   Therapeutic Modalities:   Cognitive Behavioral Therapy Solution-Focused Therapy Assertiveness Training Relapse Prevention Therapy   Kersti Scavone J Dorthea Maina, LCSW 

## 2021-11-27 NOTE — Progress Notes (Signed)
This Clinical research associate was notified by Secondary school teacher that report was called, while this Clinical research associate was attending to another patient. It was reported that patient did well during ECT and that he will be on his way back to the unit. This Clinical research associate has already called dining services to get him a tray sent up.

## 2021-11-27 NOTE — Anesthesia Preprocedure Evaluation (Signed)
Anesthesia Evaluation  Patient identified by MRN, date of birth, ID band Patient awake    Reviewed: Allergy & Precautions, NPO status , Patient's Chart, lab work & pertinent test results  History of Anesthesia Complications Negative for: history of anesthetic complications  Airway Mallampati: II   Neck ROM: Full    Dental no notable dental hx.    Pulmonary Current Smoker and Patient abstained from smoking.,  Marijuana use   Pulmonary exam normal breath sounds clear to auscultation       Cardiovascular Exercise Tolerance: Good negative cardio ROS Normal cardiovascular exam Rhythm:Regular Rate:Normal     Neuro/Psych PSYCHIATRIC DISORDERS Depression Bipolar Disorder Schizophrenia negative neurological ROS     GI/Hepatic negative GI ROS,   Endo/Other  negative endocrine ROS  Renal/GU negative Renal ROS     Musculoskeletal   Abdominal   Peds  Hematology negative hematology ROS (+)   Anesthesia Other Findings   Reproductive/Obstetrics                             Anesthesia Physical  Anesthesia Plan  ASA: 3  Anesthesia Plan: General   Post-op Pain Management:    Induction: Intravenous  PONV Risk Score and Plan: 1 and TIVA and Treatment may vary due to age or medical condition  Airway Management Planned: Natural Airway  Additional Equipment:   Intra-op Plan:   Post-operative Plan:   Informed Consent: I have reviewed the patients History and Physical, chart, labs and discussed the procedure including the risks, benefits and alternatives for the proposed anesthesia with the patient or authorized representative who has indicated his/her understanding and acceptance.       Plan Discussed with: CRNA  Anesthesia Plan Comments: (Serial consent on chart.  LMA/GETA backup discussed.  Patient consented for risks of anesthesia including but not limited to:  - adverse reactions to  medications - damage to eyes, teeth, lips or other oral mucosa - nerve damage due to positioning  - sore throat or hoarseness - damage to heart, brain, nerves, lungs, other parts of body or loss of life  Informed patient about role of CRNA in peri- and intra-operative care.  Patient voiced understanding.)        Anesthesia Quick Evaluation

## 2021-11-27 NOTE — Progress Notes (Signed)
[facility] MD Progress Note  11/27/2021 2:20 PM Timothy [name]  MRN:  [id number] Subjective: Follow-up patient with psychotic depression as part of schizoaffective disorder.  Clearly looking better.  Smiling and actually starting conversations coming up with things to talk about thinking about his discharge planning.  Denies feeling depressed denies any hallucinations denies suicidal thoughts.  Today will probably be last ECT treatment Principal Problem: Schizoaffective disorder, bipolar type ([place]) Diagnosis: Principal Problem:   Schizoaffective disorder, bipolar type ([place])  Total Time spent with patient: 30 minutes  Past Psychiatric History: Past history of recurrent psychotic symptoms with schizoaffective disorder as well as depression and suicidal thoughts  Past Medical History:  Past Medical History:  Diagnosis Date   Cannabis abuse    Paranoid schizophrenia ([place])    Schizoaffective disorder, depressive type ([place]) 09/17/2014   History reviewed. No pertinent surgical history. Family History:  Family History  Problem Relation Age of Onset   Mental illness Brother    Drug abuse Brother    Mental illness Cousin    Suicidality Cousin    Diabetes Maternal Grandmother    Family Psychiatric  History: See previous Social History:  Social History   Substance and Sexual Activity  Alcohol Use Yes   Alcohol/week: 2.0 standard drinks   Types: 2 Cans of beer per week     Social History   Substance and Sexual Activity  Drug Use Yes   Types: Marijuana, MDMA Banker)    Social History   Socioeconomic History   Marital status: Single    Spouse name: Not on file   Number of children: Not on file   Years of education: Not on file   Highest education level: Not on file  Occupational History   Not on file  Tobacco Use   Smoking status: Some Days    Packs/day: 0.25    Types: Cigarettes   Smokeless tobacco: Never  Vaping Use   Vaping Use: Never used  Substance and Sexual Activity    Alcohol use: Yes    Alcohol/week: 2.0 standard drinks    Types: 2 Cans of beer per week   Drug use: Yes    Types: Marijuana, MDMA (Ecstacy)   Sexual activity: Yes  Other Topics Concern   Not on file  Social History Narrative   Not on file   Social Determinants of Health   Financial Resource Strain: Not on file  Food Insecurity: Not on file  Transportation Needs: Not on file  Physical Activity: Not on file  Stress: Not on file  Social Connections: Not on file   Additional Social History:                         Sleep: Fair  Appetite:  Fair  Current Medications: Current Facility-Administered Medications  Medication Dose Route Frequency Provider Last Rate Last Admin   acetaminophen (TYLENOL) tablet 650 mg  650 mg Oral Once PRN Darrin Nipper, MD       Or   acetaminophen (TYLENOL) 160 MG/5ML solution 325-650 mg  325-650 mg Oral Q4H PRN Darrin Nipper, MD       acetaminophen (TYLENOL) tablet 650 mg  650 mg Oral Once PRN Darrin Nipper, MD       Or   acetaminophen (TYLENOL) 160 MG/5ML solution 325-650 mg  325-650 mg Oral Q4H PRN Darrin Nipper, MD       acetaminophen (TYLENOL) tablet 650 mg  650 mg Oral Q6H PRN Patrecia Pour, NP  650 mg at 11/26/21 1940   alum & mag hydroxide-simeth (MAALOX/MYLANTA) 200-200-20 MG/5ML suspension 30 mL  30 mL Oral Q4H PRN Patrecia Pour, NP       buPROPion (WELLBUTRIN XL) 24 hr tablet 300 mg  300 mg Oral Daily Brycin Kille, Madie Reno, MD   300 mg at 11/27/21 0755   fentaNYL (SUBLIMAZE) injection 25 mcg  25 mcg Intravenous Q5 min PRN [place] Service, Jane Canary, MD       FLUoxetine (PROZAC) capsule 20 mg  20 mg Oral Daily Patrecia Pour, NP   20 mg at 11/27/21 0755   ibuprofen (ADVIL) tablet 600 mg  600 mg Oral Q6H PRN Ferlando Lia, Madie Reno, MD   600 mg at 11/26/21 1940   magnesium hydroxide (MILK OF MAGNESIA) suspension 30 mL  30 mL Oral Daily PRN Patrecia Pour, NP       midazolam (VERSED) injection 4 mg  4 mg Intravenous Once Chloie Loney, Madie Reno, MD       midazolam (VERSED) injection 4 mg  4 mg Intravenous Once Jovi Alvizo, Madie Reno, MD       midazolam (VERSED) injection 4 mg  4 mg Intravenous Once Barack Nicodemus, Madie Reno, MD       nicotine (NICODERM CQ - dosed in mg/24 hours) patch 14 mg  14 mg Transdermal Daily Jacqueline Delapena T, MD   14 mg at 11/27/21 0756   ondansetron (ZOFRAN) injection 4 mg  4 mg Intravenous Once PRN [place] Service, Jane Canary, MD       ondansetron [facility]) injection 4 mg  4 mg Intravenous Once PRN Darrin Nipper, MD       QUEtiapine (SEROQUEL) tablet 300 mg  300 mg Oral QHS Gunther Zawadzki, Madie Reno, MD   300 mg at 11/26/21 2245    Lab Results:  Results for orders placed or performed during the hospital encounter of 11/01/21 (from the past 48 hour(s))  Glucose, capillary     Status: None   Collection Time: 11/27/21  6:16 AM  Result Value Ref Range   Glucose-Capillary 93 70 - 99 mg/dL    Comment: Glucose reference range applies only to samples taken after fasting for at least 8 hours.    Blood Alcohol level:  Lab Results  Component Value Date   ETH <10 10/31/2021   ETH <10 [id number]    Metabolic Disorder Labs: Lab Results  Component Value Date   HGBA1C 5.5 11/04/2021   MPG 111.15 11/04/2021   MPG 114 06/17/2021   Lab Results  Component Value Date   PROLACTIN 33.2 (H) 08/15/2016   Lab Results  Component Value Date   CHOL 149 11/04/2021   TRIG 56 11/04/2021   HDL 35 (L) 11/04/2021   CHOLHDL 4.3 11/04/2021   VLDL 11 11/04/2021   LDLCALC 103 (H) 11/04/2021   LDLCALC 111 (H) 06/17/2021    Physical Findings: AIMS:  , ,  ,  ,    CIWA:    COWS:     Musculoskeletal: Strength & Muscle Tone: within normal limits Gait & Station: normal Patient leans: N/A  Psychiatric Specialty Exam:  Presentation  General Appearance: Casual; Neat  Eye Contact:Fair  Speech:Slow  Speech Volume:Decreased  Handedness:Right   Mood and Affect  Mood:Anxious; Depressed  Affect:Constricted   Thought Process  Thought  Processes:Linear  Descriptions of Associations:Circumstantial  Orientation:Full (Time, Place and Person)  Thought Content:Logical  History of Schizophrenia/Schizoaffective disorder:Yes  Duration of Psychotic Symptoms:Greater than six months  Hallucinations:No data recorded Ideas of Reference:None  Suicidal Thoughts:No data recorded Homicidal Thoughts:No data recorded  Sensorium  Memory:Immediate Fair; Remote Fair  Judgment:Fair  Insight:Fair   Executive Functions  Concentration:Fair  Attention Span:Fair  [place]   Psychomotor Activity  Psychomotor Activity:No data recorded  Assets  Assets:Communication Skills; Desire for Improvement; Physical Health   Sleep  Sleep:No data recorded   Physical Exam: Physical Exam Vitals and nursing note reviewed.  Constitutional:      Appearance: Normal appearance.  HENT:     Head: Normocephalic and atraumatic.     Mouth/Throat:     Pharynx: Oropharynx is clear.  Eyes:     Pupils: Pupils are equal, round, and reactive to light.  Cardiovascular:     Rate and Rhythm: Normal rate and regular rhythm.  Pulmonary:     Effort: Pulmonary effort is normal.     Breath sounds: Normal breath sounds.  Abdominal:     General: Abdomen is flat.     Palpations: Abdomen is soft.  Musculoskeletal:        General: Normal range of motion.  Skin:    General: Skin is warm and dry.  Neurological:     General: No focal deficit present.     Mental Status: He is alert. Mental status is at baseline.  Psychiatric:        Attention and Perception: Attention normal.        Mood and Affect: Mood normal.        Speech: Speech normal.        Behavior: Behavior is cooperative.        Thought Content: Thought content normal.        Cognition and Memory: Cognition normal.        Judgment: Judgment normal.   Review of Systems  Constitutional: Negative.   HENT: Negative.    Eyes: Negative.    Respiratory: Negative.    Cardiovascular: Negative.   Gastrointestinal: Negative.   Musculoskeletal: Negative.   Skin: Negative.   Neurological: Negative.   Psychiatric/Behavioral: Negative.    Blood pressure 139/76, pulse 96, temperature 98.1 F (36.7 C), temperature source Temporal, resp. rate 18, height 6\' 3"  (1.905 m), weight 93.8 kg, SpO2 100 %. Body mass index is 25.85 kg/m.   Treatment Plan Summary: Medication management and Plan I expect today will be his last ECT treatment.  We hope to have him discharge Monday or Tuesday.  I will look into making sure we have his medicine supply ready.  Alethia Berthold, MD 11/27/2021, 2:20 PM

## 2021-11-28 DIAGNOSIS — F250 Schizoaffective disorder, bipolar type: Secondary | ICD-10-CM | POA: Diagnosis not present

## 2021-11-28 NOTE — Progress Notes (Signed)
[facility] MD Progress Note  11/28/2021 12:06 PM Timothy [name]  MRN:  [id number] Subjective: Patient is on rounds.  He is in bed and does not have any complaints.  He denies any side effects from his medications and states that he is doing fine.  No issues.  Principal Problem: Schizoaffective disorder, bipolar type ([facility]) Diagnosis: Principal Problem:   Schizoaffective disorder, bipolar type ([facility])  Total Time spent with patient: 15 minutes  Past Psychiatric History: Past history of recurrent psychotic symptoms with schizoaffective disorder as well as depression and suicidal thoughts  Past Medical History:  Past Medical History:  Diagnosis Date   Cannabis abuse    Paranoid schizophrenia ([facility])    Schizoaffective disorder, depressive type ([facility]) 09/17/2014   History reviewed. No pertinent surgical history. Family History:  Family History  Problem Relation Age of Onset   Mental illness Brother    Drug abuse Brother    Mental illness Cousin    Suicidality Cousin    Diabetes Maternal Grandmother     Social History:  Social History   Substance and Sexual Activity  Alcohol Use Yes   Alcohol/week: 2.0 standard drinks   Types: 2 Cans of beer per week     Social History   Substance and Sexual Activity  Drug Use Yes   Types: Marijuana, MDMA Chiropodist)    Social History   Socioeconomic History   Marital status: Single    Spouse name: Not on file   Number of children: Not on file   Years of education: Not on file   Highest education level: Not on file  Occupational History   Not on file  Tobacco Use   Smoking status: Some Days    Packs/day: 0.25    Types: Cigarettes   Smokeless tobacco: Never  Vaping Use   Vaping Use: Never used  Substance and Sexual Activity   Alcohol use: Yes    Alcohol/week: 2.0 standard drinks    Types: 2 Cans of beer per week   Drug use: Yes    Types: Marijuana, MDMA (Ecstacy)   Sexual activity: Yes  Other Topics Concern   Not on file  Social  History Narrative   Not on file   Social Determinants of Health   Financial Resource Strain: Not on file  Food Insecurity: Not on file  Transportation Needs: Not on file  Physical Activity: Not on file  Stress: Not on file  Social Connections: Not on file   Additional Social History:                         Sleep: Good  Appetite:  Good  Current Medications: Current Facility-Administered Medications  Medication Dose Route Frequency Provider Last Rate Last Admin   acetaminophen (TYLENOL) tablet 650 mg  650 mg Oral Once PRN Reed Breech, MD       Or   acetaminophen (TYLENOL) 160 MG/5ML solution 325-650 mg  325-650 mg Oral Q4H PRN Reed Breech, MD       acetaminophen (TYLENOL) tablet 650 mg  650 mg Oral Once PRN Reed Breech, MD       Or   acetaminophen (TYLENOL) 160 MG/5ML solution 325-650 mg  325-650 mg Oral Q4H PRN Reed Breech, MD       acetaminophen (TYLENOL) tablet 650 mg  650 mg Oral Q6H PRN Charm Rings, NP   650 mg at 11/27/21 2151   alum & mag hydroxide-simeth (MAALOX/MYLANTA) 200-200-20 MG/5ML suspension 30 mL  30 mL Oral Q4H PRN Charm RingsLord, Jamison Y, NP       buPROPion (WELLBUTRIN XL) 24 hr tablet 300 mg  300 mg Oral Daily Clapacs, Jackquline DenmarkJohn T, MD   300 mg at 11/28/21 0833   fentaNYL (SUBLIMAZE) injection 25 mcg  25 mcg Intravenous Q5 min PRN Darleene CleaverVan Staveren, Gerrit HeckGijsbertus F, MD       FLUoxetine (PROZAC) capsule 20 mg  20 mg Oral Daily Charm RingsLord, Jamison Y, NP   20 mg at 11/28/21 [id number]   ibuprofen (ADVIL) tablet 600 mg  600 mg Oral Q6H PRN Clapacs, Jackquline DenmarkJohn T, MD   600 mg at 11/26/21 1940   magnesium hydroxide (MILK OF MAGNESIA) suspension 30 mL  30 mL Oral Daily PRN Charm RingsLord, Jamison Y, NP       midazolam (VERSED) injection 4 mg  4 mg Intravenous Once Clapacs, Jackquline DenmarkJohn T, MD       midazolam (VERSED) injection 4 mg  4 mg Intravenous Once Clapacs, Jackquline DenmarkJohn T, MD       midazolam (VERSED) injection 4 mg  4 mg Intravenous Once Clapacs, Jackquline DenmarkJohn T, MD       nicotine (NICODERM CQ - dosed in  mg/24 hours) patch 14 mg  14 mg Transdermal Daily Clapacs, John T, MD   14 mg at 11/28/21 0837   ondansetron (ZOFRAN) injection 4 mg  4 mg Intravenous Once PRN Darleene CleaverVan Staveren, Gerrit HeckGijsbertus F, MD       ondansetron [facility](ZOFRAN) injection 4 mg  4 mg Intravenous Once PRN Reed BreechMazzoni, Andrea, MD       QUEtiapine (SEROQUEL) tablet 300 mg  300 mg Oral QHS Clapacs, Jackquline DenmarkJohn T, MD   300 mg at 11/27/21 2150    Lab Results:  Results for orders placed or performed during the hospital encounter of 11/01/21 (from the past 48 hour(s))  Glucose, capillary     Status: None   Collection Time: 11/27/21  6:16 AM  Result Value Ref Range   Glucose-Capillary 93 70 - 99 mg/dL    Comment: Glucose reference range applies only to samples taken after fasting for at least 8 hours.    Blood Alcohol level:  Lab Results  Component Value Date   ETH <10 10/31/2021   ETH <10 06/30/2021    Metabolic Disorder Labs: Lab Results  Component Value Date   HGBA1C 5.5 11/04/2021   MPG 111.15 11/04/2021   MPG 114 06/17/2021   Lab Results  Component Value Date   PROLACTIN 33.2 (H) 08/15/2016   Lab Results  Component Value Date   CHOL 149 11/04/2021   TRIG 56 11/04/2021   HDL 35 (L) 11/04/2021   CHOLHDL 4.3 11/04/2021   VLDL 11 11/04/2021   LDLCALC 103 (H) 11/04/2021   LDLCALC 111 (H) 06/17/2021    Physical Findings: AIMS:  , ,  ,  ,    CIWA:    COWS:     Musculoskeletal: Strength & Muscle Tone: within normal limits Gait & Station: normal Patient leans: N/A  Psychiatric Specialty Exam:  Presentation  General Appearance: Casual; Neat  Eye Contact:Fair  Speech:Slow  Speech Volume:Decreased  Handedness:Right   Mood and Affect  Mood:Anxious; Depressed  Affect:Constricted   Thought Process  Thought Processes:Linear  Descriptions of Associations:Circumstantial  Orientation:Full (Time, Place and Person)  Thought Content:Logical  History of Schizophrenia/Schizoaffective disorder:Yes  Duration of  Psychotic Symptoms:Greater than six months  Hallucinations:No data recorded Ideas of Reference:None  Suicidal Thoughts:No data recorded Homicidal Thoughts:No data recorded  Sensorium  Memory:Immediate Fair; Remote Fair  Judgment:Fair  Insight:Fair   Executive Functions  Concentration:Fair  Attention Span:Fair  Recall:Fair  Fund of Knowledge:Fair  Language:Fair   Psychomotor Activity  Psychomotor Activity:No data recorded  Assets  Assets:Communication Skills; Desire for Improvement; Physical Health   Sleep  Sleep:No data recorded   Physical Exam: Physical Exam Vitals and nursing note reviewed.  Constitutional:      Appearance: Normal appearance. He is normal weight.  Neurological:     General: No focal deficit present.     Mental Status: He is alert and oriented to person, place, and time.  Psychiatric:        Mood and Affect: Mood normal.        Behavior: Behavior normal.   Review of Systems  Constitutional: Negative.   HENT: Negative.    Eyes: Negative.   Respiratory: Negative.    Cardiovascular: Negative.   Gastrointestinal: Negative.   Genitourinary: Negative.   Musculoskeletal: Negative.   Skin: Negative.   Neurological: Negative.   Endo/Heme/Allergies: Negative.   Psychiatric/Behavioral: Negative.    Blood pressure 132/81, pulse 71, temperature 98.9 F (37.2 C), temperature source Oral, resp. rate 18, height 6\' 3"  (1.905 m), weight 93.8 kg, SpO2 100 %. Body mass index is 25.85 kg/m.   Treatment Plan Summary: Daily contact with patient to assess and evaluate symptoms and progress in treatment, Medication management, and Plan continue current medications.  , DO 11/28/2021, 12:06 PM

## 2021-11-28 NOTE — Group Note (Signed)
LCSW Group Therapy Note  Group Date: 11/28/2021 Start Time: 1330 End Time: 1430   Type of Therapy and Topic:  Group Therapy - Healthy vs Unhealthy Coping Skills  Participation Level:  Active   Description of Group The focus of this group was to determine what unhealthy coping techniques typically are used by group members and what healthy coping techniques would be helpful in coping with various problems. Patients were guided in becoming aware of the differences between healthy and unhealthy coping techniques. Patients were asked to identify 2-3 healthy coping skills they would like to learn to use more effectively.  Therapeutic Goals Patients learned that coping is what human beings do all day long to deal with various situations in their lives Patients defined and discussed healthy vs unhealthy coping techniques Patients identified their preferred coping techniques and identified whether these were healthy or unhealthy Patients determined 2-3 healthy coping skills they would like to become more familiar with and use more often. Patients provided support and ideas to each other   Summary of Patient Progress: Patient was present for the entirety of the group session. Patient stated that he has attended NA meetings in the past as a healthy support. Patient expressed the desire to find a job at [employer]. Patient was receptive to the idea of pursuing employment support through [employer]. Patient identified drawing and listening to music as a healthy coping skill he would like to use more often. Patient also expressed the desire to see a therapist.     Therapeutic Modalities Cognitive Behavioral Therapy Motivational Interviewing  Norberto Sorenson, Theresia Majors 11/28/2021  4:32 PM

## 2021-11-28 NOTE — Progress Notes (Signed)
Patient alert and oriented x 4 affect is flat but brightens upon approach no distress notes S/P ECT he is appropriate on unit, interacting with  peers and staff, denies SI/HI/AVH, he is complaint with medications. 15 minutes safety checks maintained will continue to monitor.

## 2021-11-28 NOTE — Plan of Care (Signed)
  Problem: Education: Goal: Knowledge of [place] [facility] Education information/materials will improve Outcome: Progressing Goal: Mental status will improve Description: Patient will became more alert no confusion to place and situation  Outcome: Progressing Goal: Verbalization of understanding the information provided will improve Outcome: Progressing   Problem: Health Behavior/Discharge Planning: Goal: Compliance with treatment plan for underlying cause of condition will improve Outcome: Progressing   Problem: Physical Regulation: Goal: Ability to maintain clinical measurements within normal limits will improve Outcome: Progressing   Problem: Safety: Goal: Periods of time without injury will increase Outcome: Progressing   Problem: Education: Goal: Will be free of psychotic symptoms Description: Patient will be free of psychosis  Outcome: Progressing Goal: Knowledge of the prescribed therapeutic regimen will improve Outcome: Progressing   Problem: Health Behavior/Discharge Planning: Goal: Compliance with prescribed medication regimen will improve Outcome: Progressing   Problem: Role Relationship: Goal: Ability to communicate needs accurately will improve Outcome: Progressing

## 2021-11-28 NOTE — Progress Notes (Signed)
D: Patient alert and oriented. Patient denies pain. Patient denies anxiety and depression. Patient denies SI/HI/AVH. Patient isolative to room during shift. Patient did interact with peers and staff during shift while in the dayroom.  A: Scheduled medications administered to patient, per MD orders.  Support and encouragement provided to patient.  Q15 minute safety checks maintained.   R: Patient compliant with medication administration and treatment plan. No adverse drug reactions noted. Patient remains safe on the unit at this time.

## 2021-11-29 DIAGNOSIS — F250 Schizoaffective disorder, bipolar type: Secondary | ICD-10-CM | POA: Diagnosis not present

## 2021-11-29 NOTE — BHH Counselor (Signed)
CSW met with patient 1:1 to discuss discharge planning. Patient states he plans to stay with his mother at discharge in [place] but reports he has not confirmed that he is able to stay with her, stating he tried to call his mother but she did not answer. CSW encouraged patient to call his mother again to confirm discharge plans. Patient requests mental health follow up care in [place]. No additional concerns noted.  Signed,  Berniece Salines, MSW, [place], [place] 11/29/2021 2:38 PM

## 2021-11-29 NOTE — Progress Notes (Signed)
[facility] MD Progress Note  11/29/2021 11:57 AM Timothy [name]  MRN:  [id number] Subjective:  Patient is on rounds.  He is in bed and does not have any complaints.  He denies any side effects from his medications and states that he is doing fine.  No issues.  Principal Problem: Schizoaffective disorder, bipolar type (HCC) Diagnosis: Principal Problem:   Schizoaffective disorder, bipolar type (HCC)  Total Time spent with patient: 15 minutes  Past Psychiatric History: Past history of recurrent psychotic symptoms with schizoaffective disorder as well as depression and suicidal thoughts  Past Medical History:  Past Medical History:  Diagnosis Date   Cannabis abuse    Paranoid schizophrenia (HCC)    Schizoaffective disorder, depressive type (HCC) 09/17/2014   History reviewed. No pertinent surgical history. Family History:  Family History  Problem Relation Age of Onset   Mental illness Brother    Drug abuse Brother    Mental illness Cousin    Suicidality Cousin    Diabetes Maternal Grandmother     Social History:  Social History   Substance and Sexual Activity  Alcohol Use Yes   Alcohol/week: 2.0 standard drinks   Types: 2 Cans of beer per week     Social History   Substance and Sexual Activity  Drug Use Yes   Types: Marijuana, MDMA Chiropodist)    Social History   Socioeconomic History   Marital status: Single    Spouse name: Not on file   Number of children: Not on file   Years of education: Not on file   Highest education level: Not on file  Occupational History   Not on file  Tobacco Use   Smoking status: Some Days    Packs/day: 0.25    Types: Cigarettes   Smokeless tobacco: Never  Vaping Use   Vaping Use: Never used  Substance and Sexual Activity   Alcohol use: Yes    Alcohol/week: 2.0 standard drinks    Types: 2 Cans of beer per week   Drug use: Yes    Types: Marijuana, MDMA (Ecstacy)   Sexual activity: Yes  Other Topics Concern   Not on file  Social  History Narrative   Not on file   Social Determinants of Health   Financial Resource Strain: Not on file  Food Insecurity: Not on file  Transportation Needs: Not on file  Physical Activity: Not on file  Stress: Not on file  Social Connections: Not on file   Additional Social History:                         Sleep: Good  Appetite:  Good  Current Medications: Current Facility-Administered Medications  Medication Dose Route Frequency Provider Last Rate Last Admin   acetaminophen (TYLENOL) tablet 650 mg  650 mg Oral Once PRN Reed Breech, MD       Or   acetaminophen (TYLENOL) 160 MG/5ML solution 325-650 mg  325-650 mg Oral Q4H PRN Reed Breech, MD       acetaminophen (TYLENOL) tablet 650 mg  650 mg Oral Once PRN Reed Breech, MD       Or   acetaminophen (TYLENOL) 160 MG/5ML solution 325-650 mg  325-650 mg Oral Q4H PRN Reed Breech, MD       acetaminophen (TYLENOL) tablet 650 mg  650 mg Oral Q6H PRN Charm Rings, NP   650 mg at 11/27/21 2151   alum & mag hydroxide-simeth (MAALOX/MYLANTA) 200-200-20 MG/5ML suspension 30 mL  30 mL Oral Q4H PRN Patrecia Pour, NP       buPROPion (WELLBUTRIN XL) 24 hr tablet 300 mg  300 mg Oral Daily Clapacs, Madie Reno, MD   300 mg at 11/29/21 0813   fentaNYL (SUBLIMAZE) injection 25 mcg  25 mcg Intravenous Q5 min PRN [place] Service, Jane Canary, MD       FLUoxetine (PROZAC) capsule 20 mg  20 mg Oral Daily Patrecia Pour, NP   20 mg at 11/29/21 0813   ibuprofen (ADVIL) tablet 600 mg  600 mg Oral Q6H PRN Clapacs, Madie Reno, MD   600 mg at 11/26/21 1940   magnesium hydroxide (MILK OF MAGNESIA) suspension 30 mL  30 mL Oral Daily PRN Patrecia Pour, NP       midazolam (VERSED) injection 4 mg  4 mg Intravenous Once Clapacs, Madie Reno, MD       midazolam (VERSED) injection 4 mg  4 mg Intravenous Once Clapacs, Madie Reno, MD       midazolam (VERSED) injection 4 mg  4 mg Intravenous Once Clapacs, Madie Reno, MD       nicotine (NICODERM CQ - dosed in  mg/24 hours) patch 14 mg  14 mg Transdermal Daily Clapacs, John T, MD   14 mg at 11/29/21 0815   ondansetron (ZOFRAN) injection 4 mg  4 mg Intravenous Once PRN [place] Service, Jane Canary, MD       ondansetron [facility]) injection 4 mg  4 mg Intravenous Once PRN Darrin Nipper, MD       QUEtiapine (SEROQUEL) tablet 300 mg  300 mg Oral QHS Clapacs, Madie Reno, MD   300 mg at 11/28/21 2214    Lab Results: No results found for this or any previous visit (from the past 54 hour(s)).  Blood Alcohol level:  Lab Results  Component Value Date   ETH <10 10/31/2021   ETH <10 [id number]    Metabolic Disorder Labs: Lab Results  Component Value Date   HGBA1C 5.5 11/04/2021   MPG 111.15 11/04/2021   MPG 114 06/17/2021   Lab Results  Component Value Date   PROLACTIN 33.2 (H) 08/15/2016   Lab Results  Component Value Date   CHOL 149 11/04/2021   TRIG 56 11/04/2021   HDL 35 (L) 11/04/2021   CHOLHDL 4.3 11/04/2021   VLDL 11 11/04/2021   LDLCALC 103 (H) 11/04/2021   LDLCALC 111 (H) 06/17/2021    Physical Findings: AIMS:  , ,  ,  ,    CIWA:    COWS:     Musculoskeletal: Strength & Muscle Tone: within normal limits Gait & Station: normal Patient leans: N/A  Psychiatric Specialty Exam:  Presentation  General Appearance: Casual; Neat  Eye Contact:Fair  Speech:Slow  Speech Volume:Decreased  Handedness:Right   Mood and Affect  Mood:Anxious; Depressed  Affect:Constricted   Thought Process  Thought Processes:Linear  Descriptions of Associations:Circumstantial  Orientation:Full (Time, Place and Person)  Thought Content:Logical  History of Schizophrenia/Schizoaffective disorder:Yes  Duration of Psychotic Symptoms:Greater than six months  Hallucinations:No data recorded Ideas of Reference:None  Suicidal Thoughts:No data recorded Homicidal Thoughts:No data recorded  Sensorium  Memory:Immediate Fair; Remote Fair  Judgment:Fair  Insight:Fair   Executive Functions   Concentration:Fair  Attention Span:Fair  [place]   Psychomotor Activity  Psychomotor Activity:No data recorded  Assets  Assets:Communication Skills; Desire for Improvement; Physical Health   Sleep  Sleep:No data recorded   Physical Exam: Physical Exam Vitals and nursing note  reviewed.  Constitutional:      Appearance: Normal appearance. He is normal weight.  Neurological:     General: No focal deficit present.     Mental Status: He is alert and oriented to person, place, and time.  Psychiatric:        Mood and Affect: Mood normal.        Behavior: Behavior normal.   Review of Systems  Constitutional: Negative.   HENT: Negative.    Eyes: Negative.   Respiratory: Negative.    Cardiovascular: Negative.   Gastrointestinal: Negative.   Genitourinary: Negative.   Musculoskeletal: Negative.   Skin: Negative.   Neurological: Negative.   Endo/Heme/Allergies: Negative.   Psychiatric/Behavioral: Negative.    Blood pressure (!) 145/77, pulse 81, temperature 98.8 F (37.1 C), temperature source Oral, resp. rate 18, height 6\' 3"  (1.905 m), weight 93.8 kg, SpO2 100 %. Body mass index is 25.85 kg/m.   Treatment Plan Summary: Daily contact with patient to assess and evaluate symptoms and progress in treatment, Medication management, and Plan continue current medications.  Parks Ranger, DO 11/29/2021, 11:57 AM

## 2021-11-29 NOTE — Progress Notes (Signed)
Patient alert and oriented x 4 affect is flat but brightens upon approach he is appropriate on unit, interacting with peers and staff, denies SI/HI/AVH, he is complaint with medications. 15 minutes safety checks maintained will continue to monitor.

## 2021-11-30 DIAGNOSIS — F250 Schizoaffective disorder, bipolar type: Secondary | ICD-10-CM | POA: Diagnosis not present

## 2021-11-30 NOTE — Progress Notes (Signed)
Patient alert and oriented x 4 affect is bright upon approach he is appropriate on unit, interacting with peers and staff, denies SI/HI/AVH, he is complaint with medications. 15 minutes safety checks maintained will continue to monitor.

## 2021-11-30 NOTE — Progress Notes (Signed)
[facility] MD Progress Note  11/30/2021 11:14 AM Timothy [name]  MRN:  [id number] Subjective: Timothy [name] is seen on rounds.  He asked about when he is being discharged and I told him he needs to wait till Dr. Toni Amend comes back.  Other than that, he states he is doing well has no complaints and no issues.  Principal Problem: Schizoaffective disorder, bipolar type (HCC) Diagnosis: Principal Problem:   Schizoaffective disorder, bipolar type (HCC)  Total Time spent with patient: 15 minutes  Past Psychiatric History: Past history of recurrent psychotic symptoms with schizoaffective disorder as well as depression and suicidal thoughts    Past Medical History:  Past Medical History:  Diagnosis Date   Cannabis abuse    Paranoid schizophrenia (HCC)    Schizoaffective disorder, depressive type (HCC) 09/17/2014   History reviewed. No pertinent surgical history. Family History:  Family History  Problem Relation Age of Onset   Mental illness Brother    Drug abuse Brother    Mental illness Cousin    Suicidality Cousin    Diabetes Maternal Grandmother     Social History:  Social History   Substance and Sexual Activity  Alcohol Use Yes   Alcohol/week: 2.0 standard drinks   Types: 2 Cans of beer per week     Social History   Substance and Sexual Activity  Drug Use Yes   Types: Marijuana, MDMA Chiropodist)    Social History   Socioeconomic History   Marital status: Single    Spouse name: Not on file   Number of children: Not on file   Years of education: Not on file   Highest education level: Not on file  Occupational History   Not on file  Tobacco Use   Smoking status: Some Days    Packs/day: 0.25    Types: Cigarettes   Smokeless tobacco: Never  Vaping Use   Vaping Use: Never used  Substance and Sexual Activity   Alcohol use: Yes    Alcohol/week: 2.0 standard drinks    Types: 2 Cans of beer per week   Drug use: Yes    Types: Marijuana, MDMA (Ecstacy)   Sexual activity: Yes  Other  Topics Concern   Not on file  Social History Narrative   Not on file   Social Determinants of Health   Financial Resource Strain: Not on file  Food Insecurity: Not on file  Transportation Needs: Not on file  Physical Activity: Not on file  Stress: Not on file  Social Connections: Not on file   Additional Social History:                         Sleep: Good  Appetite:  Good  Current Medications: Current Facility-Administered Medications  Medication Dose Route Frequency Provider Last Rate Last Admin   acetaminophen (TYLENOL) tablet 650 mg  650 mg Oral Once PRN Reed Breech, MD       Or   acetaminophen (TYLENOL) 160 MG/5ML solution 325-650 mg  325-650 mg Oral Q4H PRN Reed Breech, MD       acetaminophen (TYLENOL) tablet 650 mg  650 mg Oral Once PRN Reed Breech, MD       Or   acetaminophen (TYLENOL) 160 MG/5ML solution 325-650 mg  325-650 mg Oral Q4H PRN Reed Breech, MD       acetaminophen (TYLENOL) tablet 650 mg  650 mg Oral Q6H PRN Charm Rings, NP   650 mg at 11/27/21 2151  alum & mag hydroxide-simeth (MAALOX/MYLANTA) 200-200-20 MG/5ML suspension 30 mL  30 mL Oral Q4H PRN Charm Rings, NP       buPROPion (WELLBUTRIN XL) 24 hr tablet 300 mg  300 mg Oral Daily Clapacs, Jackquline Denmark, MD   300 mg at 11/30/21 0811   fentaNYL (SUBLIMAZE) injection 25 mcg  25 mcg Intravenous Q5 min PRN Darleene Cleaver, Gerrit Heck, MD       FLUoxetine (PROZAC) capsule 20 mg  20 mg Oral Daily Charm Rings, NP   20 mg at 11/30/21 2778   ibuprofen (ADVIL) tablet 600 mg  600 mg Oral Q6H PRN Clapacs, Jackquline Denmark, MD   600 mg at 11/26/21 1940   magnesium hydroxide (MILK OF MAGNESIA) suspension 30 mL  30 mL Oral Daily PRN Charm Rings, NP       midazolam (VERSED) injection 4 mg  4 mg Intravenous Once Clapacs, Jackquline Denmark, MD       midazolam (VERSED) injection 4 mg  4 mg Intravenous Once Clapacs, Jackquline Denmark, MD       midazolam (VERSED) injection 4 mg  4 mg Intravenous Once Clapacs, Jackquline Denmark, MD        nicotine (NICODERM CQ - dosed in mg/24 hours) patch 14 mg  14 mg Transdermal Daily Clapacs, John T, MD   14 mg at 11/30/21 0840   ondansetron (ZOFRAN) injection 4 mg  4 mg Intravenous Once PRN Darleene Cleaver, Gerrit Heck, MD       ondansetron [facility]) injection 4 mg  4 mg Intravenous Once PRN Reed Breech, MD       QUEtiapine (SEROQUEL) tablet 300 mg  300 mg Oral QHS Clapacs, Jackquline Denmark, MD   300 mg at 11/29/21 2122    Lab Results: No results found for this or any previous visit (from the past 48 hour(s)).  Blood Alcohol level:  Lab Results  Component Value Date   ETH <10 10/31/2021   ETH <10 06/30/2021    Metabolic Disorder Labs: Lab Results  Component Value Date   HGBA1C 5.5 11/04/2021   MPG 111.15 11/04/2021   MPG 114 06/17/2021   Lab Results  Component Value Date   PROLACTIN 33.2 (H) 08/15/2016   Lab Results  Component Value Date   CHOL 149 11/04/2021   TRIG 56 11/04/2021   HDL 35 (L) 11/04/2021   CHOLHDL 4.3 11/04/2021   VLDL 11 11/04/2021   LDLCALC 103 (H) 11/04/2021   LDLCALC 111 (H) 06/17/2021    Physical Findings: AIMS:  , ,  ,  ,    CIWA:    COWS:     Musculoskeletal: Strength & Muscle Tone: within normal limits Gait & Station: normal Patient leans: N/A  Psychiatric Specialty Exam:  Presentation  General Appearance: Casual; Neat  Eye Contact:Fair  Speech:Slow  Speech Volume:Decreased  Handedness:Right   Mood and Affect  Mood:Anxious; Depressed  Affect:Constricted   Thought Process  Thought Processes:Linear  Descriptions of Associations:Circumstantial  Orientation:Full (Time, Place and Person)  Thought Content:Logical  History of Schizophrenia/Schizoaffective disorder:Yes  Duration of Psychotic Symptoms:Greater than six months  Hallucinations:No data recorded Ideas of Reference:None  Suicidal Thoughts:No data recorded Homicidal Thoughts:No data recorded  Sensorium  Memory:Immediate Fair; Remote  Fair  Judgment:Fair  Insight:Fair   Executive Functions  Concentration:Fair  Attention Span:Fair  Recall:Fair  Fund of Knowledge:Fair  Language:Fair   Psychomotor Activity  Psychomotor Activity:No data recorded  Assets  Assets:Communication Skills; Desire for Improvement; Physical Health   Sleep  Sleep:No data  recorded   Physical Exam: Physical Exam Vitals and nursing note reviewed.  Constitutional:      Appearance: Normal appearance. He is normal weight.  Neurological:     General: No focal deficit present.     Mental Status: He is alert and oriented to person, place, and time.  Psychiatric:        Mood and Affect: Mood normal.        Behavior: Behavior normal.   Review of Systems  Constitutional: Negative.   HENT: Negative.    Eyes: Negative.   Respiratory: Negative.    Cardiovascular: Negative.   Gastrointestinal: Negative.   Genitourinary: Negative.   Musculoskeletal: Negative.   Skin: Negative.   Neurological: Negative.   Endo/Heme/Allergies: Negative.   Psychiatric/Behavioral: Negative.    Blood pressure 119/74, pulse 85, temperature 98.8 F (37.1 C), temperature source Oral, resp. rate 18, height 6\' 3"  (1.905 m), weight 93.8 kg, SpO2 100 %. Body mass index is 25.85 kg/m.   Treatment Plan Summary: Daily contact with patient to assess and evaluate symptoms and progress in treatment, Medication management, and Plan continue current medications.  Sarina Illichard Edward Rayvn Rickerson, DO 11/30/2021, 11:14 AM

## 2021-11-30 NOTE — Progress Notes (Signed)
D: Patient alert and oriented. Patient denies pain. Patient denies anxiety and depression. Patient denies SI/HI/AVH. Patient was isolative during the beginning of the shift. Patient was observed in the dayroom after social work group this afternoon. Patient interactive with peers and staff.  A: Scheduled medications administered to patient, per MD orders.  Support and encouragement provided to patient.  Q15 minute safety checks maintained.   R: Patient compliant with medication administration and treatment plan. No adverse drug reactions noted. Patient remains safe on the unit at this time.

## 2021-11-30 NOTE — Anesthesia Postprocedure Evaluation (Signed)
Anesthesia Post Note  Patient: Timothy [name]  Procedure(s) Performed: ECT TX  Patient location during evaluation: PACU Anesthesia Type: General Level of consciousness: awake and alert, oriented and patient cooperative Pain management: pain level controlled Vital Signs Assessment: post-procedure vital signs reviewed and stable Respiratory status: spontaneous breathing, nonlabored ventilation and respiratory function stable Cardiovascular status: blood pressure returned to baseline and stable Postop Assessment: adequate PO intake Anesthetic complications: no   No notable events documented.   Last Vitals:  Vitals:   11/29/21 0608 11/30/21 0618  BP: (!) 145/77 119/74  Pulse: 81 85  Resp: 18 18  Temp: 37.1 C 37.1 C  SpO2: 100% 100%    Last Pain:  Vitals:   11/30/21 0618  TempSrc: Oral  PainSc:                  Darrin Nipper

## 2021-11-30 NOTE — Plan of Care (Signed)
  Problem: Education: Goal: Knowledge of [place] [facility] Education information/materials will improve Outcome: Progressing Goal: Mental status will improve Description: Patient will became more alert no confusion to place and situation  Outcome: Progressing Goal: Verbalization of understanding the information provided will improve Outcome: Progressing   Problem: Health Behavior/Discharge Planning: Goal: Compliance with treatment plan for underlying cause of condition will improve Outcome: Progressing   Problem: Physical Regulation: Goal: Ability to maintain clinical measurements within normal limits will improve Outcome: Progressing   Problem: Safety: Goal: Periods of time without injury will increase Outcome: Progressing   Problem: Education: Goal: Knowledge of the prescribed therapeutic regimen will improve Outcome: Progressing   Problem: Health Behavior/Discharge Planning: Goal: Compliance with prescribed medication regimen will improve Outcome: Progressing   Problem: Role Relationship: Goal: Ability to communicate needs accurately will improve Outcome: Progressing

## 2021-11-30 NOTE — Group Note (Signed)
[facility] LCSW Group Therapy Note    Group Date: 11/30/2021 Start Time: 1250 End Time: 1350  Type of Therapy and Topic:  Group Therapy:  Overcoming Obstacles  Participation Level:  [facility] PARTICIPATION LEVEL: Active  Mood:  Description of Group:   In this group patients will be encouraged to explore what they see as obstacles to their own wellness and recovery. They will be guided to discuss their thoughts, feelings, and behaviors related to these obstacles. The group will process together ways to cope with barriers, with attention given to specific choices patients can make. Each patient will be challenged to identify changes they are motivated to make in order to overcome their obstacles. This group will be process-oriented, with patients participating in exploration of their own experiences as well as giving and receiving support and challenge from other group members.  Therapeutic Goals: 1. Patient will identify personal and current obstacles as they relate to admission. 2. Patient will identify barriers that currently interfere with their wellness or overcoming obstacles.  3. Patient will identify feelings, thought process and behaviors related to these barriers. 4. Patient will identify two changes they are willing to make to overcome these obstacles:    Summary of Patient Progress   Patient attended group.  Patient was attentive and supportive of other group members.  Patient identified his obstacles as being "staying busy" and "support".  He reports obstacles to overcoming these barriers as "myself and keeping a job".  He was able to engage in a discussion on steps to overcome these obstacles "budgeting and setting up a bank account".  He identified his coping skills as "exercise" and "art".     Therapeutic Modalities:   Cognitive Behavioral Therapy Solution Focused Therapy Motivational Interviewing Relapse Prevention  Therapy   Harden Mo, LCSW

## 2021-11-30 NOTE — Progress Notes (Signed)
Patient calm and pleasant during assessment, denies SI/HI/AVH. Pt compliant with medication administration per MD orders. Pt given education, support, and encouragement to be active in his treatment plan. Pt observed by this Clinical research associate interacting appropriately with staff and peers on the unit. Pt being monitored Q 15 minutes for safety per unit protocol. Pt remains safe on the unit. Pt stated that he is hopeful to discharge Friday

## 2021-12-01 DIAGNOSIS — F250 Schizoaffective disorder, bipolar type: Secondary | ICD-10-CM | POA: Diagnosis not present

## 2021-12-01 MED ORDER — QUETIAPINE FUMARATE 300 MG PO TABS: 300.0000 mg | ORAL_TABLET | Freq: Every day | ORAL | 1 refills | Status: DC

## 2021-12-01 MED ORDER — BUPROPION HCL ER (XL) 300 MG PO TB24: 300.0000 mg | ORAL_TABLET | Freq: Every day | ORAL | 1 refills | Status: DC

## 2021-12-01 MED ORDER — FLUOXETINE HCL 20 MG PO CAPS: 20.0000 mg | ORAL_CAPSULE | Freq: Every day | ORAL | 1 refills | Status: DC

## 2021-12-01 NOTE — Progress Notes (Signed)
Patient ID: Timothy [name], male   DOB: Aug 10, 1995, 26 y.o.   MRN: [id number] Patient denies SI/HI/AVH. Belongings were returned to patient. Discharge instructions  including medication and follow up information were reviewed with patient and understanding was verbalized. Patient was not observed to be in distress at time of discharge. Patient was escorted out with staff to medical mall to be transported home by [employer].

## 2021-12-01 NOTE — [facility] Counselor (Signed)
CSW obtained verbal permission to speak with the patient's mother, Carlos American, [phone].  She reports that patient is able to return to her home.   Penni Homans, MSW, LCSW 12/01/2021 11:42 AM

## 2021-12-01 NOTE — Progress Notes (Signed)
Recreation Therapy Notes    Date: 12/01/2021   Time: 10:45am     Location: Craft room   Behavioral response: N/A   Intervention Topic: Values   Discussion/Intervention: Patient refused to attend group.    Clinical Observations/Feedback:  Patient refused to attend group.    [name] LRT/CTRS        Nelle Sayed 12/01/2021 11:07 AM

## 2021-12-01 NOTE — Plan of Care (Signed)
  Problem: Education: Goal: Knowledge of [facility] Education information/materials will improve Outcome: Progressing Goal: Verbalization of understanding the information provided will improve Outcome: Progressing   Problem: Activity: Goal: Interest or engagement in activities will improve Outcome: Progressing   Problem: Health Behavior/Discharge Planning: Goal: Compliance with treatment plan for underlying cause of condition will improve Outcome: Progressing   Problem: Physical Regulation: Goal: Ability to maintain clinical measurements within normal limits will improve Outcome: Progressing   Problem: Education: Goal: Knowledge of the prescribed therapeutic regimen will improve Outcome: Progressing   Problem: Health Behavior/Discharge Planning: Goal: Compliance with prescribed medication regimen will improve Outcome: Progressing   Problem: Nutritional: Goal: Ability to achieve adequate nutritional intake will improve Outcome: Progressing   Problem: Role Relationship: Goal: Ability to communicate needs accurately will improve Outcome: Progressing

## 2021-12-01 NOTE — Discharge Summary (Signed)
Physician Discharge Summary Note  Patient:  Timothy [name] is an 26 y.o., male MRN:  [id number] DOB:  1996-03-02 Patient phone:  [phone] (home)  Patient address:   [address] [place] [address],  Total Time spent with patient: 30 minutes  Date of Admission:  11/01/2021 Date of Discharge: 12/01/2021  Reason for Admission: Patient was admitted because of suicidal ideation and psychosis hallucinations paranoia all in the context of schizoaffective disorder without recent medicine compliance  Principal Problem: Schizoaffective disorder, bipolar type [facility]) Discharge Diagnoses: Principal Problem:   Schizoaffective disorder, bipolar type (HCC)   Past Psychiatric History: Past history of recurrent psychotic depressions with a diagnosis of schizoaffective disorder depressive type  Past Medical History:  Past Medical History:  Diagnosis Date   Cannabis abuse    Paranoid schizophrenia (HCC)    Schizoaffective disorder, depressive type (HCC) 09/17/2014   History reviewed. No pertinent surgical history. Family History:  Family History  Problem Relation Age of Onset   Mental illness Brother    Drug abuse Brother    Mental illness Cousin    Suicidality Cousin    Diabetes Maternal Grandmother    Family Psychiatric  History: See previous.  Some depression in the family Social History:  Social History   Substance and Sexual Activity  Alcohol Use Yes   Alcohol/week: 2.0 standard drinks   Types: 2 Cans of beer per week     Social History   Substance and Sexual Activity  Drug Use Yes   Types: Marijuana, MDMA Chiropodist)    Social History   Socioeconomic History   Marital status: Single    Spouse name: Not on file   Number of children: Not on file   Years of education: Not on file   Highest education level: Not on file  Occupational History   Not on file  Tobacco Use   Smoking status: Some Days    Packs/day: 0.25    Types: Cigarettes   Smokeless tobacco: Never  Vaping  Use   Vaping Use: Never used  Substance and Sexual Activity   Alcohol use: Yes    Alcohol/week: 2.0 standard drinks    Types: 2 Cans of beer per week   Drug use: Yes    Types: Marijuana, MDMA (Ecstacy)   Sexual activity: Yes  Other Topics Concern   Not on file  Social History Narrative   Not on file   Social Determinants of Health   Financial Resource Strain: Not on file  Food Insecurity: Not on file  Transportation Needs: Not on file  Physical Activity: Not on file  Stress: Not on file  Social Connections: Not on file    Hospital Course: Admitted to psychiatric unit.  Continued 15-minute checks.  Did not display any dangerous aggressive or suicidal behavior while in the unit.  Patient was withdrawn a great deal initially.  Continued to report hallucinations and suicidal thoughts for many days despite medications being titrated up to appropriate levels of fluoxetine and Seroquel.  Did not act out but remain very impaired with active psychotic symptoms.  Eventually was offered the recommendation of electroconvulsive therapy for psychotic depression.  Patient gave informed consent and received 7 bilateral treatments.  He has shown significant improvement and is now denying any suicidal thought and reporting his mood is being "good".  Denies any hallucinations or psychosis.  Patient still faces the barrier of having no stable place to live but feels confident to find a bed for himself.  He will  be given a 30-day prescription as well as outpatient follow-up referral.  Physical Findings: AIMS:  , ,  ,  ,    CIWA:    COWS:     Musculoskeletal: Strength & Muscle Tone: within normal limits Gait & Station: normal Patient leans: N/A   Psychiatric Specialty Exam:  Presentation  General Appearance: Casual; Neat  Eye Contact:Fair  Speech:Slow  Speech Volume:Decreased  Handedness:Right   Mood and Affect  Mood:Anxious; Depressed  Affect:Constricted   Thought Process   Thought Processes:Linear  Descriptions of Associations:Circumstantial  Orientation:Full (Time, Place and Person)  Thought Content:Logical  History of Schizophrenia/Schizoaffective disorder:Yes  Duration of Psychotic Symptoms:Greater than six months  Hallucinations:No data recorded Ideas of Reference:None  Suicidal Thoughts:No data recorded Homicidal Thoughts:No data recorded  Sensorium  Memory:Immediate Fair; Remote Fair  Judgment:Fair  Insight:Fair   Executive Functions  Concentration:Fair  Attention Span:Fair  Recall:Fair  Fund of Knowledge:Fair  Language:Fair   Psychomotor Activity  Psychomotor Activity:No data recorded  Assets  Assets:Communication Skills; Desire for Improvement; Physical Health   Sleep  Sleep:No data recorded   Physical Exam: Physical Exam Vitals and nursing note reviewed.  Constitutional:      Appearance: Normal appearance.  HENT:     Head: Normocephalic and atraumatic.     Mouth/Throat:     Pharynx: Oropharynx is clear.  Eyes:     Pupils: Pupils are equal, round, and reactive to light.  Cardiovascular:     Rate and Rhythm: Normal rate and regular rhythm.  Pulmonary:     Effort: Pulmonary effort is normal.     Breath sounds: Normal breath sounds.  Abdominal:     General: Abdomen is flat.     Palpations: Abdomen is soft.  Musculoskeletal:        General: Normal range of motion.  Skin:    General: Skin is warm and dry.  Neurological:     General: No focal deficit present.     Mental Status: He is alert. Mental status is at baseline.  Psychiatric:        Attention and Perception: Attention normal.        Mood and Affect: Mood normal.        Speech: Speech normal.        Behavior: Behavior is cooperative.        Thought Content: Thought content normal.        Cognition and Memory: Cognition normal.        Judgment: Judgment normal.   Review of Systems  Constitutional: Negative.   HENT: Negative.    Eyes:  Negative.   Respiratory: Negative.    Cardiovascular: Negative.   Gastrointestinal: Negative.   Musculoskeletal: Negative.   Skin: Negative.   Neurological: Negative.   Psychiatric/Behavioral: Negative.    Blood pressure 117/75, pulse 82, temperature 98.2 F (36.8 C), temperature source Oral, resp. rate 18, height 6\' 3"  (1.905 m), weight 93.8 kg, SpO2 100 %. Body mass index is 25.85 kg/m.   Social History   Tobacco Use  Smoking Status Some Days   Packs/day: 0.25   Types: Cigarettes  Smokeless Tobacco Never   Tobacco Cessation:  A prescription for an FDA-approved tobacco cessation medication was offered at discharge and the patient refused   Blood Alcohol level:  Lab Results  Component Value Date   [facility]   ETH <10 06/30/2021    Metabolic Disorder Labs:  Lab Results  Component Value Date   HGBA1C 5.5 11/04/2021   MPG 111.15  11/04/2021   MPG 114 06/17/2021   Lab Results  Component Value Date   PROLACTIN 33.2 (H) 08/15/2016   Lab Results  Component Value Date   CHOL 149 11/04/2021   TRIG 56 11/04/2021   HDL 35 (L) 11/04/2021   CHOLHDL 4.3 11/04/2021   VLDL 11 11/04/2021   LDLCALC 103 (H) 11/04/2021   LDLCALC 111 (H) 06/17/2021    See Psychiatric Specialty Exam and Suicide Risk Assessment completed by Attending Physician prior to discharge.  Discharge destination:  Home  Is patient on multiple antipsychotic therapies at discharge:  No   Has Patient had three or more failed trials of antipsychotic monotherapy by history:  No  Recommended Plan for Multiple Antipsychotic Therapies: NA  Discharge Instructions     Diet - low sodium heart healthy   Complete by: As directed    Increase activity slowly   Complete by: As directed       Allergies as of 12/01/2021       Reactions   Haldol [haloperidol Lactate] Other (See Comments)   Muscle stiffness   Penicillins Hives   Felt like throat was closing Has patient had a PCN reaction causing  immediate rash, facial/tongue/throat swelling, SOB or lightheadedness with hypotension: Unknown Has patient had a PCN reaction causing severe rash involving mucus membranes or skin necrosis: unknown Has patient had a PCN reaction that required hospitalization Unknown Has patient had a PCN reaction occurring within the last 10 years: Unknown If all of the above answers are "NO", then may proceed with Cephalosporin use.   Zyprexa [olanzapine]    eps        Medication List     STOP taking these medications    divalproex 500 MG DR tablet Commonly known as: DEPAKOTE       TAKE these medications      Indication  buPROPion 300 MG 24 hr tablet Commonly known as: WELLBUTRIN XL Take 1 tablet (300 mg total) by mouth daily. Start taking on: Dec 02, 2021  Indication: Major Depressive Disorder   FLUoxetine 20 MG capsule Commonly known as: PROZAC Take 1 capsule (20 mg total) by mouth daily.  Indication: Depression   QUEtiapine 300 MG tablet Commonly known as: SEROQUEL Take 1 tablet (300 mg total) by mouth at bedtime. What changed:  medication strength how much to take Another medication with the same name was removed. Continue taking this medication, and follow the directions you see here.  Indication: Depressive Phase of Manic-Depression, Schizophrenia         Follow-up recommendations: Follow up with [facility].  Continue on current medicine.  Get a decent night's sleep.  Return to work if you feel comfortable with it.  Get help immediately if depression symptoms or hallucinations recur  Comments: Prescriptions and supply given.  Signed: Mordecai Rasmussen, MD 12/01/2021, 10:04 AM

## 2021-12-01 NOTE — Progress Notes (Signed)
Recreation Therapy Notes  INPATIENT RECREATION TR PLAN  Patient Details Name: Timothy [name] MRN: [id number] DOB: [birth year] Today's Date: 12/01/2021  Rec Therapy Plan Is patient appropriate for Therapeutic Recreation?: Yes Treatment times per week: at least 3 Estimated Length of Stay: 5-7 days TR Treatment/Interventions: Group participation (Comment)  Discharge Criteria Pt will be discharged from therapy if:: Discharged Treatment plan/goals/alternatives discussed and agreed upon by:: Patient/family  Discharge Summary Short term goals set: Patient will identify 3 positive coping skills strategies to use post d/c within 5 recreation therapy group sessions Short term goals met: Complete Progress toward goals comments: Groups attended Which groups?: Wellness, Stress management, Leisure education, Communication, Other (Comment) (Problem Solving) Reason goals not met: Patient will identify 3 positive coping skills strategies to use post d/c within 5 recreation therapy group sessions Therapeutic equipment acquired: N/A Reason patient discharged from therapy: Discharge from hospital Pt/family agrees with progress & goals achieved: Yes Date patient discharged from therapy: 12/01/21   Syana Degraffenreid 12/01/2021, 2:26 PM

## 2021-12-01 NOTE — Plan of Care (Signed)
Problem: Education: Goal: Knowledge of [facility] Education information/materials will improve 12/01/2021 1014 by Hyman Hopes, RN Outcome: Adequate for Discharge 12/01/2021 0848 by Hyman Hopes, RN Outcome: Progressing Goal: Emotional status will improve Outcome: Adequate for Discharge Goal: Mental status will improve Description: Patient will became more alert no confusion to place and situation  Outcome: Adequate for Discharge Goal: Verbalization of understanding the information provided will improve 12/01/2021 1014 by Hyman Hopes, RN Outcome: Adequate for Discharge 12/01/2021 0848 by Hyman Hopes, RN Outcome: Progressing   Problem: Activity: Goal: Interest or engagement in activities will improve 12/01/2021 1014 by Hyman Hopes, RN Outcome: Adequate for Discharge 12/01/2021 0848 by Hyman Hopes, RN Outcome: Progressing Goal: Sleeping patterns will improve Outcome: Adequate for Discharge   Problem: Coping: Goal: Ability to verbalize frustrations and anger appropriately will improve Description: Patient will verbalize frustration and anger to the staff appropriately.  Outcome: Adequate for Discharge Goal: Ability to demonstrate self-control will improve Outcome: Adequate for Discharge   Problem: Health Behavior/Discharge Planning: Goal: Identification of resources available to assist in meeting health care needs will improve Outcome: Adequate for Discharge Goal: Compliance with treatment plan for underlying cause of condition will improve 12/01/2021 1014 by Hyman Hopes, RN Outcome: Adequate for Discharge 12/01/2021 0848 by Hyman Hopes, RN Outcome: Progressing   Problem: Physical Regulation: Goal: Ability to maintain clinical measurements within normal limits will improve 12/01/2021 1014 by Hyman Hopes, RN Outcome: Adequate for Discharge 12/01/2021 0848 by Hyman Hopes, RN Outcome: Progressing   Problem:  Safety: Goal: Periods of time without injury will increase Outcome: Adequate for Discharge   Problem: Activity: Goal: Will verbalize the importance of balancing activity with adequate rest periods Outcome: Adequate for Discharge   Problem: Education: Goal: Will be free of psychotic symptoms Description: Patient will be free of psychosis  Outcome: Adequate for Discharge Goal: Knowledge of the prescribed therapeutic regimen will improve 12/01/2021 1014 by Hyman Hopes, RN Outcome: Adequate for Discharge 12/01/2021 0848 by Hyman Hopes, RN Outcome: Progressing   Problem: Coping: Goal: Coping ability will improve Outcome: Adequate for Discharge Goal: Will verbalize feelings Outcome: Adequate for Discharge   Problem: Health Behavior/Discharge Planning: Goal: Compliance with prescribed medication regimen will improve 12/01/2021 1014 by Hyman Hopes, RN Outcome: Adequate for Discharge 12/01/2021 0848 by Hyman Hopes, RN Outcome: Progressing   Problem: Nutritional: Goal: Ability to achieve adequate nutritional intake will improve 12/01/2021 1014 by Hyman Hopes, RN Outcome: Adequate for Discharge 12/01/2021 0848 by Hyman Hopes, RN Outcome: Progressing   Problem: Role Relationship: Goal: Ability to communicate needs accurately will improve 12/01/2021 1014 by Hyman Hopes, RN Outcome: Adequate for Discharge 12/01/2021 0848 by Hyman Hopes, RN Outcome: Progressing Goal: Ability to interact with others will improve Outcome: Adequate for Discharge   Problem: Safety: Goal: Ability to redirect hostility and anger into socially appropriate behaviors will improve Outcome: Adequate for Discharge Goal: Ability to remain free from injury will improve Outcome: Adequate for Discharge   Problem: Self-Care: Goal: Ability to participate in self-care as condition permits will improve Outcome: Adequate for Discharge   Problem: Self-Concept: Goal: Will  verbalize positive feelings about self Outcome: Adequate for Discharge   Problem: Coping Skills Goal: STG - Patient will identify 3 positive coping skills strategies to use post d/c within 5 recreation therapy group sessions Description: STG - Patient will identify 3 positive coping skills strategies to use post d/c within 5  recreation therapy group sessions Outcome: Adequate for Discharge

## 2021-12-01 NOTE — [facility]HH Suicide Risk Assessment (Signed)
[facility] Discharge Suicide Risk Assessment   Principal Problem: Schizoaffective disorder, bipolar type (HCC) Discharge Diagnoses: Principal Problem:   Schizoaffective disorder, bipolar type (HCC)   Total Time spent with patient: 30 minutes  Musculoskeletal: Strength & Muscle Tone: within normal limits Gait & Station: normal Patient leans: N/A  Psychiatric Specialty Exam  Presentation  General Appearance: Casual; Neat  Eye Contact:Fair  Speech:Slow  Speech Volume:Decreased  Handedness:Right   Mood and Affect  Mood:Anxious; Depressed  Duration of Depression Symptoms: Greater than two weeks  Affect:Constricted   Thought Process  Thought Processes:Linear  Descriptions of Associations:Circumstantial  Orientation:Full (Time, Place and Person)  Thought Content:Logical  History of Schizophrenia/Schizoaffective disorder:Yes  Duration of Psychotic Symptoms:Greater than six months  Hallucinations:No data recorded Ideas of Reference:None  Suicidal Thoughts:No data recorded Homicidal Thoughts:No data recorded  Sensorium  Memory:Immediate Fair; Remote Fair  Judgment:Fair  Insight:Fair   Executive Functions  Concentration:Fair  Attention Span:Fair  Recall:Fair  Fund of Knowledge:Fair  Language:Fair   Psychomotor Activity  Psychomotor Activity:No data recorded  Assets  Assets:Communication Skills; Desire for Improvement; Physical Health   Sleep  Sleep:No data recorded  Physical Exam: Physical Exam Vitals and nursing note reviewed.  Constitutional:      Appearance: Normal appearance.  HENT:     Head: Normocephalic and atraumatic.     Mouth/Throat:     Pharynx: Oropharynx is clear.  Eyes:     Pupils: Pupils are equal, round, and reactive to light.  Cardiovascular:     Rate and Rhythm: Normal rate and regular rhythm.  Pulmonary:     Effort: Pulmonary effort is normal.     Breath sounds: Normal breath sounds.  Abdominal:     General:  Abdomen is flat.     Palpations: Abdomen is soft.  Musculoskeletal:        General: Normal range of motion.  Skin:    General: Skin is warm and dry.  Neurological:     General: No focal deficit present.     Mental Status: He is alert. Mental status is at baseline.  Psychiatric:        Attention and Perception: Attention normal.        Mood and Affect: Mood normal.        Speech: Speech normal.        Behavior: Behavior is cooperative.        Thought Content: Thought content normal.        Cognition and Memory: Cognition normal.        Judgment: Judgment normal.   Review of Systems  Constitutional: Negative.   HENT: Negative.    Eyes: Negative.   Respiratory: Negative.    Cardiovascular: Negative.   Gastrointestinal: Negative.   Musculoskeletal: Negative.   Skin: Negative.   Neurological: Negative.   Psychiatric/Behavioral:  Positive for memory loss. Negative for depression, hallucinations, substance abuse and suicidal ideas. The patient is not nervous/anxious and does not have insomnia.   Blood pressure 117/75, pulse 82, temperature 98.2 F (36.8 C), temperature source Oral, resp. rate 18, height 6\' 3"  (1.905 m), weight 93.8 kg, SpO2 100 %. Body mass index is 25.85 kg/m.  Mental Status Per Nursing Assessment::   On Admission:  NA  Demographic Factors:  Male, Adolescent or young adult, Low socioeconomic status, and Living alone  Loss Factors: Financial problems/change in socioeconomic status  Historical Factors: Prior suicide attempts  Risk Reduction Factors:   Employed and Positive coping skills or problem solving skills  Continued Clinical Symptoms:  Dysthymia  Cognitive Features That Contribute To Risk:  None    Suicide Risk:  Minimal: No identifiable suicidal ideation.  Patients presenting with no risk factors but with morbid ruminations; may be classified as minimal risk based on the severity of the depressive symptoms    Plan Of Care/Follow-up  recommendations:  Patient was educated about the importance of staying on medication and following up with outpatient treatment.  He is being given a 30-day supply of medicine and prescriptions and referred to outpatient treatment in the community.  Currently denies any suicidal ideation and expresses positive forward looking plans.  Timothy Rasmussen, MD 12/01/2021, 9:52 AM

## 2021-12-01 NOTE — Group Note (Signed)
[facility] LCSW Group Therapy Note   Group Date: 12/01/2021 Start Time: 1300 End Time: 1400  Type of Therapy/Topic:  Group Therapy:  Feelings about Diagnosis  Participation Level:  Active    Description of Group:    This group will allow patients to explore their thoughts and feelings about diagnoses they have received. Patients will be guided to explore their level of understanding and acceptance of these diagnoses. Facilitator will encourage patients to process their thoughts and feelings about the reactions of others to their diagnosis, and will guide patients in identifying ways to discuss their diagnosis with significant others in their lives. This group will be process-oriented, with patients participating in exploration of their own experiences as well as giving and receiving support and challenge from other group members.   Therapeutic Goals: 1. Patient will demonstrate understanding of diagnosis as evidence by identifying two or more symptoms of the disorder:  2. Patient will be able to express two feelings regarding the diagnosis 3. Patient will demonstrate ability to communicate their needs through discussion and/or role plays  Summary of Patient Progress: Patient was present for the entirety of the discussion. He talked about his experiences with having a mental health diagnosis. Pt shared that he has mixed feelings about having a diagnosis both positive and negative. He stated that him and his mother have difficulties communicating around his mental health but that she just wants him to get better. Pt talked about his plans for the future and trying to get back to having two jobs, a place to live, and a vehicle. Pt was open and receptive to feedback/comments from peers and facilitator.    Therapeutic Modalities:   Cognitive Behavioral Therapy Brief Therapy Feelings Identification    Shirl Harris, LCSW

## 2021-12-01 NOTE — Progress Notes (Signed)
  [facility] Adult Case Management Discharge Plan :  Will you be returning to the same living situation after discharge:  No.  Pt reports that he is returning to his mothers home.  At discharge, do you have transportation home?: Yes,  CSW will assist patient in transportation needs. Do you have the ability to pay for your medications: No.  Release of information consent forms completed and in the chart;  Patient's signature needed at discharge.  Patient to Follow up at:  Follow-up Information     [facility] Follow up.   Specialty: Urgent Care Why: Psychiatry Walk-Ins: Monday, Wednesday, Thursday and Friday from 8am-11am.  Please arrive by 7:30AM.   Assessment/Therapy Walk-Ins:  Monday and Wednesday's 8am-11am.  Please arrive by 7:30am. Contact information: [phone] [place] [address] [phone]                Next level of care provider has access to [facility] Link:no  Safety Planning and Suicide Prevention discussed: Yes,  SPE completed with the patient.      Has patient been referred to the Quitline?: Patient refused referral  Patient declined Quitline referral. CSW provided patient with print out of Quitline information.   Patient has been referred for addiction treatment: Pt. refused referral  Harden Mo, LCSW 12/01/2021, 10:23 AM

## 2021-12-03 ENCOUNTER — Emergency Department (HOSPITAL_COMMUNITY)
Admission: EM | Admit: 2021-12-03 | Discharge: 2021-12-07 | Disposition: A | Discharge status: Home / Self Care | Payer: 59 | Attending: Emergency Medicine | Admitting: Emergency Medicine

## 2021-12-03 ENCOUNTER — Encounter (HOSPITAL_COMMUNITY): Payer: Self-pay | Admitting: Emergency Medicine

## 2021-12-03 ENCOUNTER — Emergency Department (HOSPITAL_COMMUNITY): Payer: 59

## 2021-12-03 DIAGNOSIS — R456 Violent behavior: Secondary | ICD-10-CM | POA: Insufficient documentation

## 2021-12-03 DIAGNOSIS — Z20822 Contact with and (suspected) exposure to covid-19: Secondary | ICD-10-CM | POA: Diagnosis not present

## 2021-12-03 DIAGNOSIS — F1220 Cannabis dependence, uncomplicated: Secondary | ICD-10-CM | POA: Insufficient documentation

## 2021-12-03 DIAGNOSIS — X58XXXA Exposure to other specified factors, initial encounter: Secondary | ICD-10-CM | POA: Insufficient documentation

## 2021-12-03 DIAGNOSIS — F250 Schizoaffective disorder, bipolar type: Secondary | ICD-10-CM | POA: Diagnosis not present

## 2021-12-03 DIAGNOSIS — Z79899 Other long term (current) drug therapy: Secondary | ICD-10-CM | POA: Insufficient documentation

## 2021-12-03 DIAGNOSIS — S61412A Laceration without foreign body of left hand, initial encounter: Secondary | ICD-10-CM | POA: Insufficient documentation

## 2021-12-03 DIAGNOSIS — R4689 Other symptoms and signs involving appearance and behavior: Secondary | ICD-10-CM

## 2021-12-03 DIAGNOSIS — S6992XA Unspecified injury of left wrist, hand and finger(s), initial encounter: Secondary | ICD-10-CM | POA: Diagnosis present

## 2021-12-03 LAB — RAPID HIV SCREEN (HIV 1/2 AB+AG)
HIV 1/2 Antibodies: NONREACTIVE
HIV-1 P24 Antigen - HIV24: NONREACTIVE

## 2021-12-03 LAB — CBC WITH DIFFERENTIAL/PLATELET
Abs Immature Granulocytes: 0.05 10*3/uL (ref 0.00–0.07)
Basophils Absolute: 0 10*3/uL (ref 0.0–0.1)
Basophils Relative: 0 %
Eosinophils Absolute: 0 10*3/uL (ref 0.0–0.5)
Eosinophils Relative: 0 %
HCT: 45.3 % (ref 39.0–52.0)
Hemoglobin: 15.3 g/dL (ref 13.0–17.0)
Immature Granulocytes: 1 %
Lymphocytes Relative: 13 %
Lymphs Abs: 1.3 10*3/uL (ref 0.7–4.0)
MCH: 30.5 pg (ref 26.0–34.0)
MCHC: 33.8 g/dL (ref 30.0–36.0)
MCV: 90.2 fL (ref 80.0–100.0)
Monocytes Absolute: 1.3 10*3/uL — ABNORMAL HIGH (ref 0.1–1.0)
Monocytes Relative: 13 %
Neutro Abs: 7.2 10*3/uL (ref 1.7–7.7)
Neutrophils Relative %: 73 %
Platelets: 324 10*3/uL (ref 150–400)
RBC: 5.02 MIL/uL (ref 4.22–5.81)
RDW: 13.7 % (ref 11.5–15.5)
WBC: 9.9 10*3/uL (ref 4.0–10.5)
nRBC: 0 % (ref 0.0–0.2)

## 2021-12-03 LAB — URINALYSIS, ROUTINE W REFLEX MICROSCOPIC
Bilirubin Urine: NEGATIVE
Glucose, UA: NEGATIVE mg/dL
Hgb urine dipstick: NEGATIVE
Ketones, ur: 20 mg/dL — AB
Leukocytes,Ua: NEGATIVE
Nitrite: NEGATIVE
Protein, ur: NEGATIVE mg/dL
Specific Gravity, Urine: 1.025 (ref 1.005–1.030)
pH: 5 (ref 5.0–8.0)

## 2021-12-03 LAB — COMPREHENSIVE METABOLIC PANEL
ALT: 41 U/L (ref 0–44)
AST: 49 U/L — ABNORMAL HIGH (ref 15–41)
Albumin: 4.5 g/dL (ref 3.5–5.0)
Alkaline Phosphatase: 49 U/L (ref 38–126)
Anion gap: 15 (ref 5–15)
BUN: 29 mg/dL — ABNORMAL HIGH (ref 6–20)
CO2: 22 mmol/L (ref 22–32)
Calcium: 9.7 mg/dL (ref 8.9–10.3)
Chloride: 105 mmol/L (ref 98–111)
Creatinine, Ser: 1.61 mg/dL — ABNORMAL HIGH (ref 0.61–1.24)
GFR, Estimated: 60 mL/min — ABNORMAL LOW (ref >60)
Glucose, Bld: 102 mg/dL — ABNORMAL HIGH (ref 70–99)
Potassium: 4.4 mmol/L (ref 3.5–5.1)
Sodium: 142 mmol/L (ref 135–145)
Total Bilirubin: 1.8 mg/dL — ABNORMAL HIGH (ref 0.3–1.2)
Total Protein: 8.7 g/dL — ABNORMAL HIGH (ref 6.5–8.1)

## 2021-12-03 LAB — RESP PANEL BY RT-PCR (FLU A&B, COVID) ARPGX2
Influenza A by PCR: NEGATIVE
Influenza B by PCR: NEGATIVE
SARS Coronavirus 2 by RT PCR: NEGATIVE

## 2021-12-03 LAB — RAPID URINE DRUG SCREEN, HOSP PERFORMED
Amphetamines: NOT DETECTED
Barbiturates: NOT DETECTED
Benzodiazepines: NOT DETECTED
Cocaine: NOT DETECTED
Opiates: NOT DETECTED
Tetrahydrocannabinol: POSITIVE — AB

## 2021-12-03 LAB — ETHANOL: Alcohol, Ethyl (B): <10 mg/dL (ref <10)

## 2021-12-03 LAB — HEPATITIS B SURFACE ANTIGEN: Hepatitis B Surface Ag: NONREACTIVE

## 2021-12-03 MED ORDER — CEFAZOLIN SODIUM-DEXTROSE 1-4 GM/50ML-% IV SOLN
1.0000 g | Freq: Once | INTRAVENOUS | Status: AC
  Administered 2021-12-03: 1 g via INTRAVENOUS
  Filled 2021-12-03: qty 50

## 2021-12-03 MED ORDER — BUPROPION HCL ER (XL) 150 MG PO TB24
300.0000 mg | ORAL_TABLET | Freq: Every day | ORAL | Status: DC
  Administered 2021-12-03 – 2021-12-07 (×5): 300 mg via ORAL
  Filled 2021-12-03 (×5): qty 2

## 2021-12-03 MED ORDER — LIDOCAINE HCL (PF) 1 % IJ SOLN
30.0000 mL | Freq: Once | INTRAMUSCULAR | Status: AC
  Administered 2021-12-03: 30 mL via INTRADERMAL
  Filled 2021-12-03: qty 30

## 2021-12-03 MED ORDER — QUETIAPINE FUMARATE 300 MG PO TABS
300.0000 mg | ORAL_TABLET | Freq: Every day | ORAL | Status: DC
  Administered 2021-12-03 – 2021-12-06 (×4): 300 mg via ORAL
  Filled 2021-12-03 (×4): qty 1

## 2021-12-03 MED ORDER — FLUOXETINE HCL 20 MG PO CAPS
20.0000 mg | ORAL_CAPSULE | Freq: Every day | ORAL | Status: DC
  Administered 2021-12-03 – 2021-12-07 (×5): 20 mg via ORAL
  Filled 2021-12-03 (×5): qty 1

## 2021-12-03 MED ORDER — ZIPRASIDONE MESYLATE 20 MG IM SOLR
10.0000 mg | Freq: Once | INTRAMUSCULAR | Status: AC
  Administered 2021-12-03: 10 mg via INTRAMUSCULAR
  Filled 2021-12-03: qty 20

## 2021-12-03 MED ORDER — STERILE WATER FOR INJECTION IJ SOLN
INTRAMUSCULAR | Status: AC
  Filled 2021-12-03: qty 10

## 2021-12-03 MED ORDER — ZIPRASIDONE MESYLATE 20 MG IM SOLR
10.0000 mg | Freq: Once | INTRAMUSCULAR | Status: AC
  Administered 2021-12-03: 10 mg via INTRAMUSCULAR

## 2021-12-03 NOTE — BH Assessment (Signed)
Contacted RN to arrange tele-assessment. Pt has received medication and is unable to participate at this time.   Evelena Peat, [facility], [facility] Triage Specialist [phone]

## 2021-12-03 NOTE — ED Notes (Signed)
Patient threw a fan at the pharmacy tech standing right at the door. Security at bedside.
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
## 2021-12-03 NOTE — ED Notes (Signed)
Patient remains calm and cooperative while changing.

## 2021-12-03 NOTE — ED Notes (Signed)
Patient was cooperative for injection. Security remained at bedside.

## 2021-12-03 NOTE — ED Notes (Signed)
Patient requesting water and food. Provided with water and sandwich.

## 2021-12-03 NOTE — ED Notes (Signed)
Patient following commands. Hand soaking per MD request.

## 2021-12-03 NOTE — ED Notes (Signed)
Patient given water
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
## 2021-12-03 NOTE — ED Notes (Signed)
Patient given sandwich and water

## 2021-12-03 NOTE — ED Notes (Signed)
Patient urinated while changing into scrubs. Urine sample not collected. Three labeled patient belongings bags secured at nurse's station.

## 2021-12-03 NOTE — ED Notes (Signed)
Patient speaking with mother on phone at this time.

## 2021-12-03 NOTE — ED Notes (Signed)
Urinal at bedside and patient is aware urine sample is needed.

## 2021-12-03 NOTE — ED Notes (Signed)
Patient transported to CT 

## 2021-12-03 NOTE — ED Notes (Signed)
Patient given meal tray.

## 2021-12-03 NOTE — ED Notes (Signed)
Patient became agitated with mother on phone. Patient ended call and was provided with water.

## 2021-12-04 LAB — HCV INTERPRETATION

## 2021-12-04 LAB — HCV AB W REFLEX TO QUANT PCR: HCV Ab: NONREACTIVE

## 2021-12-04 MED ORDER — IBUPROFEN 200 MG PO TABS
400.0000 mg | ORAL_TABLET | Freq: Once | ORAL | Status: AC
  Administered 2021-12-04: 400 mg via ORAL
  Filled 2021-12-04: qty 2

## 2021-12-04 NOTE — Consult Note (Incomplete)
Telepsych Consultation   Reason for Consult:  *** Referring Physician:  *** Location of Patient:  Location of Provider: { [facility] Provider Location:[id number]}  Patient Identification: LESTHER [name] MRN:  [id number] Principal Diagnosis: <principal problem not specified> Diagnosis:  Active Problems:   * No active hospital problems. *   Total Time spent with patient: {Time; 15 min - 8 hours:17441}  Subjective:   Timothy [name] is a 26 y.o. male patient admitted with ***.  HPI:  ***  Past Psychiatric History: ***  Risk to Self:   Risk to Others:   Prior Inpatient Therapy:   Prior Outpatient Therapy:    Past Medical History:  Past Medical History:  Diagnosis Date   Cannabis abuse    Paranoid schizophrenia ([place])    Schizoaffective disorder, depressive type ([place]) 09/17/2014   History reviewed. No pertinent surgical history. Family History:  Family History  Problem Relation Age of Onset   Mental illness Brother    Drug abuse Brother    Mental illness Cousin    Suicidality Cousin    Diabetes Maternal Grandmother    Family Psychiatric  History: *** Social History:  Social History   Substance and Sexual Activity  Alcohol Use Yes   Alcohol/week: 2.0 standard drinks   Types: 2 Cans of beer per week     Social History   Substance and Sexual Activity  Drug Use Yes   Types: Marijuana, MDMA Banker)    Social History   Socioeconomic History   Marital status: Single    Spouse name: Not on file   Number of children: Not on file   Years of education: Not on file   Highest education level: Not on file  Occupational History   Not on file  Tobacco Use   Smoking status: Some Days    Packs/day: 0.25    Types: Cigarettes   Smokeless tobacco: Never  Vaping Use   Vaping Use: Never used  Substance and Sexual Activity   Alcohol use: Yes    Alcohol/week: 2.0 standard drinks    Types: 2 Cans of beer per week   Drug use: Yes    Types: Marijuana, MDMA (Ecstacy)   Sexual  activity: Yes  Other Topics Concern   Not on file  Social History Narrative   Not on file   Social Determinants of Health   Financial Resource Strain: Not on file  Food Insecurity: Not on file  Transportation Needs: Not on file  Physical Activity: Not on file  Stress: Not on file  Social Connections: Not on file   Additional Social History:    Allergies:   Allergies  Allergen Reactions   Haldol [Haloperidol Lactate] Other (See Comments)    Muscle stiffness   Penicillins Hives    Felt like throat was closing Has patient had a PCN reaction causing immediate rash, facial/tongue/throat swelling, SOB or lightheadedness with hypotension: Unknown Has patient had a PCN reaction causing severe rash involving mucus membranes or skin necrosis: unknown Has patient had a PCN reaction that required hospitalization Unknown Has patient had a PCN reaction occurring within the last 10 years: Unknown If all of the above answers are "NO", then may proceed with Cephalosporin use.    Zyprexa [Olanzapine]     eps    Labs:  Results for orders placed or performed during the hospital encounter of 12/03/21 (from the past 48 hour(s))  Comprehensive metabolic panel     Status: Abnormal   Collection Time: 12/03/21  4:43 PM  Result Value Ref Range   Sodium 142 135 - 145 mmol/L   Potassium 4.4 3.5 - 5.1 mmol/L   Chloride 105 98 - 111 mmol/L   CO2 22 22 - 32 mmol/L   Glucose, Bld 102 (H) 70 - 99 mg/dL    Comment: Glucose reference range applies only to samples taken after fasting for at least 8 hours.   BUN 29 (H) 6 - 20 mg/dL   Creatinine, Ser 1.61 (H) 0.61 - 1.24 mg/dL   Calcium 9.7 8.9 - 10.3 mg/dL   Total Protein 8.7 (H) 6.5 - 8.1 g/dL   Albumin 4.5 3.5 - 5.0 g/dL   AST 49 (H) 15 - 41 U/L   ALT 41 0 - 44 U/L   Alkaline Phosphatase 49 38 - 126 U/L   Total Bilirubin 1.8 (H) 0.3 - 1.2 mg/dL   GFR, Estimated 60 (L) >60 mL/min    Comment: (NOTE) Calculated using the CKD-EPI Creatinine Equation  (2021)    Anion gap 15 5 - 15    Comment: Performed at [facility], [place] [address], [place], [place] [address]  Ethanol     Status: None   Collection Time: 12/03/21  4:43 PM  Result Value Ref Range   Alcohol, Ethyl (B) <10 <10 mg/dL    Comment: (NOTE) Lowest detectable limit for serum alcohol is 10 mg/dL.  For medical purposes only. Performed at [facility], [place] [address], [place], [place] [address]   CBC with Differential     Status: Abnormal   Collection Time: 12/03/21  4:43 PM  Result Value Ref Range   WBC 9.9 4.0 - 10.5 K/uL   RBC 5.02 4.22 - 5.81 MIL/uL   Hemoglobin 15.3 13.0 - 17.0 g/dL   HCT 45.3 39.0 - 52.0 %   MCV 90.2 80.0 - 100.0 fL   MCH 30.5 26.0 - 34.0 pg   MCHC 33.8 30.0 - 36.0 g/dL   RDW 13.7 11.5 - 15.5 %   Platelets 324 150 - 400 K/uL   nRBC 0.0 0.0 - 0.2 %   Neutrophils Relative % 73 %   Neutro Abs 7.2 1.7 - 7.7 K/uL   Lymphocytes Relative 13 %   Lymphs Abs 1.3 0.7 - 4.0 K/uL   Monocytes Relative 13 %   Monocytes Absolute 1.3 (H) 0.1 - 1.0 K/uL   Eosinophils Relative 0 %   Eosinophils Absolute 0.0 0.0 - 0.5 K/uL   Basophils Relative 0 %   Basophils Absolute 0.0 0.0 - 0.1 K/uL   Immature Granulocytes 1 %   Abs Immature Granulocytes 0.05 0.00 - 0.07 K/uL    Comment: Performed at [facility], [place] [address], [place], [place] [address]  Rapid HIV screen (HIV 1/2 Ab+Ag)     Status: None   Collection Time: 12/03/21  4:44 PM  Result Value Ref Range   HIV-1 P24 Antigen - HIV24 NON REACTIVE NON REACTIVE    Comment: RESULT CALLED TO, READ BACK BY AND VERIFIED WITH: RN B SAVOIE AT [id number] 12/03/21 CRUICKSHANK A (NOTE) Detection of p24 may be inhibited by biotin in the sample, causing false negative results in acute infection.    HIV 1/2 Antibodies NON REACTIVE NON REACTIVE    Comment: RESULT CALLED TO, READ BACK BY AND VERIFIED WITH: RN B SAVOIE AT [id number] 12/03/21 CRUICKSHANK A    Interpretation (HIV Ag  Ab)      A non reactive test result means that HIV 1 or HIV  2 antibodies and HIV 1 p24 antigen were not detected in the specimen.    Comment: Performed at [facility], [place] [address], [place], [place] [address]  Hepatitis B surface antigen     Status: None   Collection Time: 12/03/21  4:44 PM  Result Value Ref Range   Hepatitis B Surface Ag NON REACTIVE NON REACTIVE    Comment: Performed at [facility] [address], [place], [place] [address]  HCV Ab w Reflex to Quant PCR     Status: None   Collection Time: 12/03/21  4:44 PM  Result Value Ref Range   HCV Ab Non Reactive Non Reactive    Comment: (NOTE) Performed At: [facility] [address] [place], [place] [id number] Rush Farmer MD [id number]   Interpretation:     Status: None   Collection Time: 12/03/21  4:44 PM  Result Value Ref Range   HCV Interp 1: Comment     Comment: (NOTE) Not infected with HCV unless early or acute infection is suspected (which may be delayed in an immunocompromised individual), or other evidence exists to indicate HCV infection. Performed At: [facility] [place], [place] [id number] Rush Farmer MD [id number]   Resp Panel by RT-PCR (Flu A&B, Covid) Anterior Nasal Swab     Status: None   Collection Time: 12/03/21  9:12 PM   Specimen: Anterior Nasal Swab  Result Value Ref Range   SARS Coronavirus 2 by RT PCR NEGATIVE NEGATIVE    Comment: (NOTE) SARS-CoV-2 target nucleic acids are NOT DETECTED.  The SARS-CoV-2 RNA is generally detectable in upper respiratory specimens during the acute phase of infection. The lowest concentration of SARS-CoV-2 viral copies this assay can detect is 138 copies/mL. A negative result does not preclude SARS-Cov-2 infection and should not be used as the sole basis for treatment or other patient management decisions. A negative result may occur with  improper specimen collection/handling, submission of  specimen other than nasopharyngeal swab, presence of viral mutation(s) within the areas targeted by this assay, and inadequate number of viral copies(<138 copies/mL). A negative result must be combined with clinical observations, patient history, and epidemiological information. The expected result is Negative.  Fact Sheet for Patients:  EntrepreneurPulse.com.au  Fact Sheet for Healthcare Providers:  IncredibleEmployment.be  This test is no t yet approved or cleared by the [place] FDA and  has been authorized for detection and/or diagnosis of SARS-CoV-2 by FDA under an Emergency Use Authorization (EUA). This EUA will remain  in effect (meaning this test can be used) for the duration of the COVID-19 declaration under Section 564(b)(1) of the Act, 21 U.S.C.section 360bbb-3(b)(1), unless the authorization is terminated  or revoked sooner.       Influenza A by PCR NEGATIVE NEGATIVE   Influenza B by PCR NEGATIVE NEGATIVE    Comment: (NOTE) The Xpert Xpress SARS-CoV-2/FLU/RSV plus assay is intended as an aid in the diagnosis of influenza from Nasopharyngeal swab specimens and should not be used as a sole basis for treatment. Nasal washings and aspirates are unacceptable for Xpert Xpress SARS-CoV-2/FLU/RSV testing.  Fact Sheet for Patients: EntrepreneurPulse.com.au  Fact Sheet for Healthcare Providers: IncredibleEmployment.be  This test is not yet approved or cleared by the [place] FDA and has been authorized for detection and/or diagnosis of SARS-CoV-2 by FDA under an Emergency Use Authorization (EUA). This EUA will remain in effect (meaning this test can be used) for the duration of the COVID-19 declaration under  Section 564(b)(1) of the Act, 21 U.S.C. section 360bbb-3(b)(1), unless the authorization is terminated or revoked.  Performed at [facility], [place] [address], [place], [place] [address]   Urine rapid drug screen (hosp performed)     Status: Abnormal   Collection Time: 12/03/21  9:30 PM  Result Value Ref Range   Opiates NONE DETECTED NONE DETECTED   Cocaine NONE DETECTED NONE DETECTED   Benzodiazepines NONE DETECTED NONE DETECTED   Amphetamines NONE DETECTED NONE DETECTED   Tetrahydrocannabinol POSITIVE (A) NONE DETECTED   Barbiturates NONE DETECTED NONE DETECTED    Comment: (NOTE) DRUG SCREEN FOR MEDICAL PURPOSES ONLY.  IF CONFIRMATION IS NEEDED FOR ANY PURPOSE, NOTIFY LAB WITHIN 5 DAYS.  LOWEST DETECTABLE LIMITS FOR URINE DRUG SCREEN Drug Class                     Cutoff (ng/mL) Amphetamine and metabolites    1000 Barbiturate and metabolites    200 Benzodiazepine                 [id number] Tricyclics and metabolites     300 Opiates and metabolites        300 Cocaine and metabolites        300 THC                            50 Performed at [facility], [place] [address] [place], [place] [address]   Urinalysis, Routine w reflex microscopic     Status: Abnormal   Collection Time: 12/03/21  9:30 PM  Result Value Ref Range   Color, Urine YELLOW YELLOW   APPearance HAZY (A) CLEAR   Specific Gravity, Urine 1.025 1.005 - 1.030   pH 5.0 5.0 - 8.0   Glucose, UA NEGATIVE NEGATIVE mg/dL   Hgb urine dipstick NEGATIVE NEGATIVE   Bilirubin Urine NEGATIVE NEGATIVE   Ketones, ur 20 (A) NEGATIVE mg/dL   Protein, ur NEGATIVE NEGATIVE mg/dL   Nitrite NEGATIVE NEGATIVE   Leukocytes,Ua NEGATIVE NEGATIVE    Comment: Performed at [facility] [address], [place], [place] [address]    Medications:  Current Facility-Administered Medications  Medication Dose Route Frequency Provider Last Rate Last Admin   buPROPion (WELLBUTRIN XL) 24 hr tablet 300 mg  300 mg Oral Daily Carmin Muskrat, MD   300 mg at 12/04/21 [id number]   FLUoxetine (PROZAC) capsule 20 mg  20 mg Oral Daily Carmin Muskrat, MD   20 mg at 12/04/21  0908   QUEtiapine (SEROQUEL) tablet 300 mg  300 mg Oral QHS Carmin Muskrat, MD   300 mg at 12/03/21 2130   Current Outpatient Medications  Medication Sig Dispense Refill   buPROPion (WELLBUTRIN XL) 300 MG 24 hr tablet Take 1 tablet (300 mg total) by mouth daily. 30 tablet 1   FLUoxetine (PROZAC) 20 MG capsule Take 1 capsule (20 mg total) by mouth daily. 30 capsule 1   QUEtiapine (SEROQUEL) 300 MG tablet Take 1 tablet (300 mg total) by mouth at bedtime. 30 tablet 1    Musculoskeletal: Strength & Muscle Tone: {desc; muscle tone:32375} Gait & Station: {PE GAIT ED EF:[id number] Patient leans: {Patient Leans:21022755}          Psychiatric Specialty Exam:  Presentation  General Appearance: Casual; Neat  Eye Contact:Fair  Speech:Slow  Speech Volume:Decreased  Handedness:Right   Mood and Affect  Mood:Anxious; Depressed  Affect:Constricted   Thought Process  Thought Processes:Linear  Descriptions of Associations:Circumstantial  Orientation:Full (Time, Place and Person)  Thought Content:Logical  History of Schizophrenia/Schizoaffective disorder:Yes  Duration of Psychotic Symptoms:Greater than six months  Hallucinations:No data recorded Ideas of Reference:None  Suicidal Thoughts:No data recorded Homicidal Thoughts:No data recorded  Sensorium  Memory:Immediate Fair; Remote Fair  Judgment:Fair  Insight:Fair   Executive Functions  Concentration:Fair  Attention Span:Fair  [place]   Psychomotor Activity  Psychomotor Activity:No data recorded  Assets  Assets:Communication Skills; Desire for Improvement; Physical Health   Sleep  Sleep:No data recorded   Physical Exam: Physical Exam Vitals and nursing note reviewed.   ROS Blood pressure 123/64, pulse 96, temperature 98.2 F (36.8 C), temperature source Oral, resp. rate 16, SpO2 96 %. There is no height or weight on file to calculate  BMI.  Treatment Plan Summary: {CHL [facility] MD [place] XA:[id number]  Disposition: {CHL [facility] Consult Plan:20772}  This service was provided via telemedicine using a 2-way, interactive audio and video technology.  Names of all persons participating in this telemedicine service and their role in this encounter. Name: Oneida Alar Role: PMHNP  Name: Hampton Abbot Role: Attending MD  Name: Timothy [name] Role: patient  Name:  Role:     Inda Merlin, NP 12/04/2021 4:57 PM

## 2021-12-04 NOTE — ED Provider Notes (Signed)
Emergency Medicine Observation Re-evaluation Note  Timothy [name] is a 26 y.o. male, seen on rounds today.  Pt initially presented to the ED for complaints of Aggressive Behavior Currently, the patient is pt sleeping.  Physical Exam  BP 129/76   Pulse 90   Temp 98.5 F (36.9 C) (Oral)   Resp 17   SpO2 100%  Physical Exam General: sleeping Cardiac: regular rate Lungs: clear Psych:   ED Course / MDM  EKG:   I have reviewed the labs performed to date as well as medications administered while in observation.  Recent changes in the last 24 hours include none.  Plan  Current plan is for pt still needs psych evaluation was too sleepy earlier. Timothy [name] is under involuntary commitment.      Gwyneth Sprout, MD 12/04/21 1433

## 2021-12-04 NOTE — Progress Notes (Signed)
Inpatient Behavioral Health Placement  Pt meets inpatient criteria per , NP. There are no available beds at [facility] per [facility] Lucia Gaskins, RN. Referral was sent to the following facilities;   Destination Service Provider Address Phone Fax  [facility]  [address] [place] [address] [phone] [facility]  [place], [place] [address] [place]  [facility] [address] [place] [address] [phone]  [facility]  [place] [address] [place] [address] [phone] [facility]  [address], [place] [address] [phone]  [facility]  [address] [place] [address] [phone] [place], [place] [address] [phone] [facility]  [address] [place] [address] [phone] [facility]  [address] [place] [address] [phone] [facility]  [address] [place] [address] [phone]  [facility]  [place], [place] [address] [phone]  [facility]  [address] [place] [address] [place]  [facility]  [address], [place] [address] [phone]  [facility]  [address] [place] [address] [phone]  [place]  [place], [place]  [address] [phone]    Situation ongoing,  CSW will follow up.   Benjaman Kindler, MSW, LCSWA 12/04/2021  @ 10:01 PM,

## 2021-12-04 NOTE — ED Notes (Signed)
Pt transferred to room 29 at 9:15 AM and has been calm and cooperative that time. Pt laid in bed and watched TV or slept. Pt ate lunch and dinner. Pt denies SI and HI. When asked about AVH, pt agreed to "hearing things." When I tried to clarify, pt said, "My brain just stopped, and I got lost in my mind." Pt would not elaborate.

## 2021-12-04 NOTE — ED Notes (Signed)
Timothy [name] remains asleep after receiving ice cream . No sleep disturbance or distress noted skin color appropriate for ethnicity respirations 16 and easy.

## 2021-12-04 NOTE — Progress Notes (Signed)
TOC CSW received a call from pt's mother Scarlette Slice requesting for psych provider to give her a call.   Valentina Shaggy.Ronit Cranfield, MSW, LCSWA [facility]  Transitions of Care Clinical Social Worker I Direct Dial: [phone]  Fax: [phone] [email or web address]
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
## 2021-12-04 NOTE — BH Assessment (Signed)
Clinician sent a message to pt's team inquiring as to whether pt was alert and oriented or if he was still under the effects of the medication given earlier. Pt's nurse, Esbeyde RN, stated pt is still asleep from the medication given and that she would reach out if pt wakes up later.

## 2021-12-05 MED ORDER — IBUPROFEN 200 MG PO TABS
600.0000 mg | ORAL_TABLET | Freq: Four times a day (QID) | ORAL | Status: DC | PRN
  Administered 2021-12-05 (×3): 600 mg via ORAL
  Filled 2021-12-05 (×3): qty 3

## 2021-12-05 NOTE — Progress Notes (Addendum)
Per Oluwatosin Olasunkanmi,NP, patient meets criteria for inpatient treatment. There are no available beds at [facility] today. CSW re-faxed referrals to the following facilities for review:  [facility] [address] [place] [address] [place] --  [place], [place] [address] [phone] --  [facility] [address] [place] [address] [phone] --  [place] [place] [address] [phone] --  [facility] [address] [place] [address] [phone] --  [facility]  Pending - Request Sent N/A [place] [place] [address] [phone] --  [place] N/A [address] [place] [address] [phone] --  [facility]  Pending - Request Sent N/A [address] [place] [address] [phone] --  [facility]  Pending - Request Sent N/A [place] [place] [address] [phone] --  [facility]  Pending - Request Sent N/A [address] [place] [address] [phone] --  [facility]  Pending - Request Sent N/A [place], [place] [address] [phone] --  [facility]  Pending - Request Sent N/A [address] [place] [address] [phone] --  [facility]  Pending - Request Sent N/A [address] [place] [address] [phone] --  [facility]  Pending  - Request Sent N/A [address], [place] [address] [phone] --  [place] N/A [address] [place] [address] [phone] --   TTS will continue to seek bed placement.  Glennie Isle, MSW, [facility] Phone: [phone] Disposition/TOC

## 2021-12-05 NOTE — ED Notes (Signed)
Pt remains asleep with no broken or disturbed sleep patterns. Timothy [name] has not shown any signs of distress and respirations are 16 BPM and easy with skin color appropriate for ethnicity.

## 2021-12-05 NOTE — ED Notes (Signed)
Pt alert, calm, and cooperative this shift. Pt is withdrawn and appears sad. Pt speech is slow and quiet. Pt laid in bed and watched TV. Pt ate 95% of breakfast, lunch, and dinner. When asked about AVH, pt would not elaborate. Pt denies HI. Pt endorses SI saying, "I don't want to be here no more." Pt showered at 2:50 PM. Pt requested transport via wheelchair to the shower due to right knee pain.

## 2021-12-05 NOTE — ED Notes (Addendum)
Awake affect is flat mood remains labile but behavior is self controlled no acting out behavior noted. Call from pt mother Timothy [name] who states she has been trying to talk with someone from [facility] treatment team since he was admitted and has not been unable to get in contact with anyone. Timothy [name] was asked if it was ok to speak with his mother about his care and treatment he verbally agreed  that it was ok to discuss all of his information with her as she is a major part of his support system.

## 2021-12-05 NOTE — ED Notes (Signed)
Timothy [name] has remained asleep with no distress or disturbed sleep patterns noted, respirations are easy and skin color remains appropriate for ethnicity.

## 2021-12-05 NOTE — ED Notes (Signed)
Pt denies SI and HI. When asked about AVH, pt said he sees dark spots. Pt appears sad and withdrawn. Pt said, "I'm tired of my mom putting me down. I can't take all my stuff to a job." Pt reports 8/10 right knee pain.

## 2021-12-06 DIAGNOSIS — F250 Schizoaffective disorder, bipolar type: Secondary | ICD-10-CM

## 2021-12-06 NOTE — ED Notes (Signed)
Patient resting comfortably,   Quiet.  Low voice.   Guarded. Sad.   Medication compliant.

## 2021-12-06 NOTE — ED Notes (Signed)
Patient quiet, guarded, withdrawn.   Endorsed suicidal.

## 2021-12-06 NOTE — BH Assessment (Signed)
Received message from Alfonzo Feller, RN who said Pt's mother is inquiring about Pt's current status. Medical record indicates Pt gave verbal permission to speak with his mother. This TTS counselor called his mother, Scarlette Slice, at [phone]. She says she did not know what had happened to Pt and she considered filing a missing person's report. Explained to Ms Reed Pandy that Pt is under involuntary commitment, inpatient psychiatric treatment has been recommended, and that multiple facilities have been contacted for placement. She says she wanted to inform the mental health team that she and Pt's grandmother are afraid of Pt and believe he is a danger to himself and other people. She says he has bizarre and frightening behaviors. She says he has a history of overdosing and cutting himself. She explains that he cannot care for his daily needs, such as caring for his hygiene or managing money. TTS answered his mother's questions and recommended she consider having Pt agree to a mental psychiatric advanced directive so she can have better access to Pt's information and assist when he is in crisis.   Pamalee Leyden, [facility], [facility] Triage Specialist [phone]

## 2021-12-06 NOTE — ED Notes (Signed)
Sleeping without distress or disturbance respirations easy at a rate 16 bpm.
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
## 2021-12-06 NOTE — ED Provider Notes (Signed)
Emergency Medicine Observation Re-evaluation Note  Timothy [name] is a 26 y.o. male, seen on rounds today.  Pt initially presented to the ED for complaints of Aggressive Behavior Currently, the patient is resting.  Physical Exam  BP 133/67 (BP Location: Left Arm)   Pulse 80   Temp (!) 97.5 F (36.4 C) (Oral)   Resp 17   SpO2 98%  Physical Exam Neurological:     Mental Status: He is alert.  Psychiatric:        Mood and Affect: Mood normal.    ED Course / MDM  EKG:   I have reviewed the labs performed to date as well as medications administered while in observation.  Recent changes in the last 24 hours include nothing.  Plan  Current plan is for inpatient treatment. Timothy [name] is under involuntary commitment.      Virgina Norfolk, DO 12/06/21 [phone]

## 2021-12-06 NOTE — Progress Notes (Signed)
Inpatient Behavioral Health Placement  Pt meets inpatient criteria per Olasunkanmi,NP. There are no available beds at [facility] per [facility] Fr[facility] Michael, RN. Referral was sent to the following facilities;   Destination Service Provider Address Phone Fax  [facility]  [address] [place] [address] [phone]  [facility]  [address] [place], [place] [address] [phone]  [facility] [address] [place] [address] [phone]  [facility]  [address] [place] [address] [phone]  [facility]  [address], [place] [address] [phone]  [facility]  [address] [place] [address] [phone]  [facility]  [address] [place] [address] [phone]  [facility]  [address], [place] [address] [phone]  [facility]  [address] [place] [address] [phone]  [facility]  [address], [place] [address] [phone]  [facility]  [address] [place] [address] [phone]  [facility]  [address] [place] [address] [phone]  [facility]  [address], [place] [address] [phone]  [facility]  [address] [place] [address] [phone]  [facility]  [address] [place] [address] [phone]    Situation ongoing,  CSW will follow up.   Maryjean Ka, MSW, LCSWA 12/06/2021  @ 12:19 AM

## 2021-12-06 NOTE — Progress Notes (Signed)
Faythe Dingwall with [employer] contacted CSW in reference to the patient. It was reported that there are currently no beds available.   Glennie Isle, MSW, Laurence Compton Phone: [phone] Disposition/TOC

## 2021-12-06 NOTE — Progress Notes (Signed)
CSW spoke with Timothy [name] with [facility]. It was reported that this patient will be further reviewed by the provider.  Crissie Reese, MSW, Lenice Pressman Phone: [phone] Disposition/TOC

## 2021-12-06 NOTE — ED Notes (Signed)
Assumed care at this time. Pt currently lying in bed, eyes closed with equal rise and fall of chest.

## 2021-12-07 ENCOUNTER — Encounter: Payer: Self-pay | Admitting: Psychiatry

## 2021-12-07 ENCOUNTER — Other Ambulatory Visit: Payer: Self-pay

## 2021-12-07 ENCOUNTER — Inpatient Hospital Stay
Admission: AD | Admit: 2021-12-07 | Discharge: 2022-01-07 | DRG: 885 | Disposition: A | Discharge status: Home / Self Care | Payer: 59 | Source: Intra-hospital | Attending: Psychiatry | Admitting: Psychiatry

## 2021-12-07 DIAGNOSIS — Z818 Family history of other mental and behavioral disorders: Secondary | ICD-10-CM | POA: Diagnosis not present

## 2021-12-07 DIAGNOSIS — R45851 Suicidal ideations: Secondary | ICD-10-CM | POA: Diagnosis present

## 2021-12-07 DIAGNOSIS — Z5900 Homelessness unspecified: Secondary | ICD-10-CM | POA: Diagnosis not present

## 2021-12-07 DIAGNOSIS — F1220 Cannabis dependence, uncomplicated: Secondary | ICD-10-CM | POA: Diagnosis present

## 2021-12-07 DIAGNOSIS — Z880 Allergy status to penicillin: Secondary | ICD-10-CM

## 2021-12-07 DIAGNOSIS — Z888 Allergy status to other drugs, medicaments and biological substances status: Secondary | ICD-10-CM

## 2021-12-07 DIAGNOSIS — Z79899 Other long term (current) drug therapy: Secondary | ICD-10-CM

## 2021-12-07 DIAGNOSIS — S61412A Laceration without foreign body of left hand, initial encounter: Secondary | ICD-10-CM | POA: Diagnosis not present

## 2021-12-07 DIAGNOSIS — Z813 Family history of other psychoactive substance abuse and dependence: Secondary | ICD-10-CM | POA: Diagnosis not present

## 2021-12-07 DIAGNOSIS — F251 Schizoaffective disorder, depressive type: Secondary | ICD-10-CM | POA: Diagnosis present

## 2021-12-07 LAB — SARS CORONAVIRUS 2 BY RT PCR: SARS Coronavirus 2 by RT PCR: NEGATIVE

## 2021-12-07 MED ORDER — QUETIAPINE FUMARATE 200 MG PO TABS: 300.0000 mg | ORAL_TABLET | Freq: Every day | ORAL | Status: DC

## 2021-12-07 MED ORDER — FLUOXETINE HCL 20 MG PO CAPS
20.0000 mg | ORAL_CAPSULE | Freq: Every day | ORAL | Status: DC
  Administered 2021-12-08 – 2021-12-10 (×3): 20 mg via ORAL
  Filled 2021-12-07 (×3): qty 1

## 2021-12-07 MED ORDER — ACETAMINOPHEN 325 MG PO TABS
650.0000 mg | ORAL_TABLET | Freq: Four times a day (QID) | ORAL | Status: DC | PRN
  Administered 2021-12-08 – 2022-01-06 (×32): 650 mg via ORAL
  Filled 2021-12-07 (×33): qty 2

## 2021-12-07 MED ORDER — NICOTINE 14 MG/24HR TD PT24
14.0000 mg | MEDICATED_PATCH | Freq: Every day | TRANSDERMAL | Status: DC
  Administered 2021-12-08 – 2022-01-07 (×27): 14 mg via TRANSDERMAL
  Filled 2021-12-07 (×28): qty 1

## 2021-12-07 MED ORDER — ZIPRASIDONE MESYLATE 20 MG IM SOLR
20.0000 mg | Freq: Two times a day (BID) | INTRAMUSCULAR | Status: DC | PRN
Start: 2021-12-07 — End: 2022-01-07
  Administered 2021-12-07 – 2021-12-13 (×3): 20 mg via INTRAMUSCULAR
  Filled 2021-12-07 (×3): qty 20

## 2021-12-07 MED ORDER — MAGNESIUM HYDROXIDE 400 MG/5ML PO SUSP: 30.0000 mL | Freq: Every day | ORAL | Status: DC | PRN

## 2021-12-07 MED ORDER — BUPROPION HCL ER (XL) 150 MG PO TB24
300.0000 mg | ORAL_TABLET | Freq: Every day | ORAL | Status: DC
  Administered 2021-12-08 – 2022-01-07 (×31): 300 mg via ORAL
  Filled 2021-12-07 (×31): qty 2

## 2021-12-07 MED ORDER — ALUM & MAG HYDROXIDE-SIMETH 200-200-20 MG/5ML PO SUSP: 30.0000 mL | ORAL | Status: DC | PRN

## 2021-12-07 MED ORDER — HYDROXYZINE HCL 50 MG PO TABS
50.0000 mg | ORAL_TABLET | Freq: Three times a day (TID) | ORAL | Status: DC | PRN
Start: 2021-12-07 — End: 2022-01-07
  Administered 2021-12-11 – 2022-01-06 (×6): 50 mg via ORAL
  Filled 2021-12-07 (×7): qty 1

## 2021-12-07 MED ORDER — IBUPROFEN 600 MG PO TABS
600.0000 mg | ORAL_TABLET | Freq: Four times a day (QID) | ORAL | Status: DC | PRN
  Administered 2021-12-07 – 2021-12-31 (×11): 600 mg via ORAL
  Filled 2021-12-07 (×12): qty 1

## 2021-12-07 MED ORDER — LORAZEPAM 2 MG/ML IJ SOLN: 2.0000 mg | INTRAMUSCULAR | Status: DC | PRN

## 2021-12-07 MED ORDER — LORAZEPAM 2 MG PO TABS
2.0000 mg | ORAL_TABLET | ORAL | Status: DC | PRN
  Administered 2021-12-11 – 2022-01-05 (×10): 2 mg via ORAL
  Filled 2021-12-07 (×10): qty 1

## 2021-12-07 NOTE — ED Provider Notes (Signed)
Patient admitted to [facility] with Dr. Ledora Bottcher, Briny Breezes, DO 12/07/21 1303

## 2021-12-07 NOTE — Tx Team (Signed)
Initial Treatment Plan 12/07/2021 4:35 PM [name] IAX:[id number]    PATIENT STRESSORS: Financial difficulties   Marital or family conflict   Medication change or noncompliance     PATIENT STRENGTHS: Motivation for treatment/growth    PATIENT IDENTIFIED PROBLEMS: Family conflict, medication compliance                      DISCHARGE CRITERIA:  Improved stabilization in mood, thinking, and/or behavior  PRELIMINARY DISCHARGE PLAN: Placement in alternative living arrangements  PATIENT/FAMILY INVOLVEMENT: This treatment plan has been presented to and reviewed with the patient, Timothy [name].  The patient and family have been given the opportunity to ask questions and make suggestions.  Hyman Hopes, RN 12/07/2021, 4:35 PM
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
## 2021-12-07 NOTE — Progress Notes (Signed)
After admission patient isolative to room, tearful, yelling "get out", "just get me out of hear", "just kill me already."  MD ordered PRN medication.  PRN geodon IM was given to patient.

## 2021-12-07 NOTE — ED Provider Notes (Signed)
Emergency Medicine Observation Re-evaluation Note  Timothy [name] is a 26 y.o. male, seen on rounds today.  Pt initially presented to the ED for complaints of Aggressive Behavior Currently, the patient is resting.  Physical Exam  BP 117/74   Pulse 93   Temp 97.7 F (36.5 C) (Oral)   Resp 12   SpO2 98%  Physical Exam Pulmonary:     Effort: Pulmonary effort is normal.  Neurological:     Mental Status: He is alert.  Psychiatric:        Mood and Affect: Mood normal.    ED Course / MDM  EKG:   I have reviewed the labs performed to date as well as medications administered while in observation.  Recent changes in the last 24 hours including nothing.  Plan  Current plan is for inpatient treatment. [name] is under involuntary commitment.      Virgina Norfolk, DO 12/07/21 6010

## 2021-12-07 NOTE — Progress Notes (Signed)
Patient ID: Timothy [name], male   DOB: [birth year], 26 y.o.   MRN: [id number] Admission note: Patient is a 26 year old male, presents IVC per report of schizoaffective disorder depressive type. Patient is alert and oriented to unit. Patients affect is blunted. Patient is cooperative during assessment questions but avoids eye contact. Patient presents with left wrist laceration, right palm abrasion, forehead abrastion, right pec abrasion, right knee scratches/scab, calloused right and left foot on skin assessment. Patient rates pain 7/10.   Patient states that the reason for this admission is due to hallucination, stating that he was seeing shadows. Patient also identified issues with mother as a stressor stating that "I just need to get out of [facility]." Patient also includes that he "feels like someone is trying to kill me." Patient currently endorses SI, HI "towards the people that jumped me", and VH of shadows in the room. Patient denied auditory hallucinations.   Patient states that medication compliance and "working on myself" are two areas that he would like to work on while being on the unit.   Unit policies explained and verbalized understanding. Q15 minute checks maintained and will continue to monitor.

## 2021-12-07 NOTE — Progress Notes (Signed)
Pt given PRN medication prior to this nurse coming on the unit. Pt has been asleep. Pt being monitored Q 15 minutes for safety per unit protocol. Pt remains safe on the unit.

## 2021-12-08 DIAGNOSIS — F251 Schizoaffective disorder, depressive type: Secondary | ICD-10-CM

## 2021-12-08 MED ORDER — ACETAMINOPHEN 500 MG PO TABS
1000.0000 mg | ORAL_TABLET | ORAL | Status: AC
Start: 2021-12-08 — End: 2021-12-08
  Administered 2021-12-08: 1000 mg via ORAL
  Filled 2021-12-08: qty 2

## 2021-12-08 MED ORDER — PANTOPRAZOLE SODIUM 40 MG PO TBEC
40.0000 mg | DELAYED_RELEASE_TABLET | Freq: Every day | ORAL | Status: DC
  Administered 2021-12-08 – 2022-01-07 (×31): 40 mg via ORAL
  Filled 2021-12-08 (×31): qty 1

## 2021-12-08 MED ORDER — CALCIUM CARBONATE ANTACID 500 MG PO CHEW
2.0000 | CHEWABLE_TABLET | ORAL | Status: AC
Start: 2021-12-08 — End: 2021-12-08
  Administered 2021-12-08: 400 mg via ORAL
  Filled 2021-12-08: qty 2

## 2021-12-08 NOTE — Progress Notes (Signed)
Patient just received PRN Geodon IM. Patient just started punching the walls and banging his head on the doors, saying he's hearing voices. Patient tolerated the injection well, without any issues. Patient is now sitting in the chair outside of the nurses station. Patient remains safe on the unit.
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
## 2021-12-08 NOTE — Progress Notes (Signed)
EKG done and placed in patient chart.

## 2021-12-08 NOTE — H&P (Signed)
Psychiatric Admission Assessment Adult  Patient Identification: Timothy [name] MRN:  [id number] Date of Evaluation:  12/08/2021 Chief Complaint:  Schizoaffective disorder, depressive type ([place]) [F25.1] Principal Diagnosis: Schizoaffective disorder, depressive type ([place]) Diagnosis:  Principal Problem:   Schizoaffective disorder, depressive type ([place]) Active Problems:   Cannabis use disorder, moderate, dependence ([place])   Homelessness  History of Present Illness: Patient seen and chart reviewed.  Patient well known from previous encounters.  26 year old man with a history of schizoaffective disorder.  He was discharged from our unit on May 30 and presented back to an emergency facility in [place] only 2 days later.  At the time he was agitated and somewhat hostile disorganized paranoid and also making suicidal statements.  Patient admitted that he had used a little bit of cannabis after discharge but it sounds like in general things did not go well.  He tells me that after leaving here he could not find his driver's license and so he had no ID.  He could not get in touch with his work.  His mood rapidly became worse despite taking his medicine.  Started hearing voices and having suicidal thoughts.  When he was picked up by the police he was reportedly trying to punch automobiles.  Patient tells me today that he is once again having suicidal thoughts without a specific plan.  He is having auditory hallucinations.  Mood is feeling very negative and hopeless. Associated Signs/Symptoms: Depression Symptoms:  depressed mood, anhedonia, psychomotor retardation, suicidal thoughts without plan, Duration of Depression Symptoms: Greater than two weeks  (Hypo) Manic Symptoms:  Impulsivity, Anxiety Symptoms:  Excessive Worry, Psychotic Symptoms:  Hallucinations: Auditory Paranoia, PTSD Symptoms: Negative Total Time spent with patient: 1 hour  Past Psychiatric History: Patient has a history of  schizoaffective disorder for the last several years with multiple hospitalizations usually presenting with psychosis and depression.  Even at his best he has been functioning very poorly outside the hospital.  Patient care is limited by his lack of resources and insurance and homelessness  Is the patient at risk to self? Yes.    Has the patient been a risk to self in the past 6 months? Yes.    Has the patient been a risk to self within the distant past? Yes.    Is the patient a risk to others? No.  Has the patient been a risk to others in the past 6 months? Yes.    Has the patient been a risk to others within the distant past? Yes.     Prior Inpatient Therapy:   Prior Outpatient Therapy:    Alcohol Screening: Patient refused Alcohol Screening Tool: Yes 1. How often do you have a drink containing alcohol?: Never 2. How many drinks containing alcohol do you have on a typical day when you are drinking?: 1 or 2 3. How often do you have six or more drinks on one occasion?: Never AUDIT-C Score: 0 4. How often during the last year have you found that you were not able to stop drinking once you had started?: Never 5. How often during the last year have you failed to do what was normally expected from you because of drinking?: Never 6. How often during the last year have you needed a first drink in the morning to get yourself going after a heavy drinking session?: Never 7. How often during the last year have you had a feeling of guilt of remorse after drinking?: Never 8. How often during the last year  have you been unable to remember what happened the night before because you had been drinking?: Never 9. Have you or someone else been injured as a result of your drinking?: No 10. Has a relative or friend or a doctor or another health worker been concerned about your drinking or suggested you cut down?: No Alcohol Use Disorder Identification Test Final Score (AUDIT): 0 Substance Abuse History in the  last 12 months:  Yes.   Consequences of Substance Abuse: It seems extremely likely that use of cannabis at least partially contributes to worsening psychotic symptoms Previous Psychotropic Medications: Yes  Psychological Evaluations: Yes  Past Medical History:  Past Medical History:  Diagnosis Date   Cannabis abuse    Paranoid schizophrenia ([place])    Schizoaffective disorder, depressive type ([place]) 09/17/2014   History reviewed. No pertinent surgical history. Family History:  Family History  Problem Relation Age of Onset   Mental illness Brother    Drug abuse Brother    Mental illness Cousin    Suicidality Cousin    Diabetes Maternal Grandmother    Family Psychiatric  History: Some suicidal behavior in the family from a cousin.  Mental illness and 1 brother Tobacco Screening:   Social History:  Social History   Substance and Sexual Activity  Alcohol Use Yes   Alcohol/week: 2.0 standard drinks   Types: 2 Cans of beer per week     Social History   Substance and Sexual Activity  Drug Use Yes   Types: Marijuana, MDMA (Ecstacy)    Additional Social History:                           Allergies:   Allergies  Allergen Reactions   Haldol [Haloperidol Lactate] Other (See Comments)    Muscle stiffness   Penicillins Hives    Felt like throat was closing Has patient had a PCN reaction causing immediate rash, facial/tongue/throat swelling, SOB or lightheadedness with hypotension: Unknown Has patient had a PCN reaction causing severe rash involving mucus membranes or skin necrosis: unknown Has patient had a PCN reaction that required hospitalization Unknown Has patient had a PCN reaction occurring within the last 10 years: Unknown If all of the above answers are "NO", then may proceed with Cephalosporin use.    Zyprexa [Olanzapine]     eps   Lab Results:  Results for orders placed or performed during the hospital encounter of 12/03/21 (from the past 48 hour(s))   SARS Coronavirus 2 by RT PCR (hospital order, performed in [facility] hospital lab) *cepheid single result test* Anterior Nasal Swab     Status: None   Collection Time: 12/07/21 11:23 AM   Specimen: Anterior Nasal Swab  Result Value Ref Range   SARS Coronavirus 2 by RT PCR NEGATIVE NEGATIVE    Comment: (NOTE) SARS-CoV-2 target nucleic acids are NOT DETECTED.  The SARS-CoV-2 RNA is generally detectable in upper and lower respiratory specimens during the acute phase of infection. The lowest concentration of SARS-CoV-2 viral copies this assay can detect is 250 copies / mL. A negative result does not preclude SARS-CoV-2 infection and should not be used as the sole basis for treatment or other patient management decisions.  A negative result may occur with improper specimen collection / handling, submission of specimen other than nasopharyngeal swab, presence of viral mutation(s) within the areas targeted by this assay, and inadequate number of viral copies (<250 copies / mL). A negative result must be  combined with clinical observations, patient history, and epidemiological information.  Fact Sheet for Patients:   https://www.patel.info/  Fact Sheet for Healthcare Providers: https://hall.com/  This test is not yet approved or  cleared by the [place] FDA and has been authorized for detection and/or diagnosis of SARS-CoV-2 by FDA under an Emergency Use Authorization (EUA).  This EUA will remain in effect (meaning this test can be used) for the duration of the COVID-19 declaration under Section 564(b)(1) of the Act, 21 U.S.C. section 360bbb-3(b)(1), unless the authorization is terminated or revoked sooner.  Performed at [facility], [place] [address], [place], [place] [address]     Blood Alcohol level:  Lab Results  Component Value Date   [facility] <10 12/03/2021   ETH <10 0000000    Metabolic Disorder Labs:  Lab  Results  Component Value Date   HGBA1C 5.5 11/04/2021   MPG 111.15 11/04/2021   MPG 114 06/17/2021   Lab Results  Component Value Date   PROLACTIN 33.2 (H) 08/15/2016   Lab Results  Component Value Date   CHOL 149 11/04/2021   TRIG 56 11/04/2021   HDL 35 (L) 11/04/2021   CHOLHDL 4.3 11/04/2021   VLDL 11 11/04/2021   LDLCALC 103 (H) 11/04/2021   LDLCALC 111 (H) 06/17/2021    Current Medications: Current Facility-Administered Medications  Medication Dose Route Frequency Provider Last Rate Last Admin   acetaminophen (TYLENOL) tablet 1,000 mg  1,000 mg Oral NOW Hagar Sadiq, Madie Reno, MD       acetaminophen (TYLENOL) tablet 650 mg  650 mg Oral Q6H PRN Lazarus [place], Madie Reno, MD   650 mg at 12/08/21 0647   alum & mag hydroxide-simeth (MAALOX/MYLANTA) 200-200-20 MG/5ML suspension 30 mL  30 mL Oral Q4H PRN Saahas Hidrogo, Madie Reno, MD       buPROPion (WELLBUTRIN XL) 24 hr tablet 300 mg  300 mg Oral Daily Juanmanuel Marohl T, MD   300 mg at 12/08/21 0853   FLUoxetine (PROZAC) capsule 20 mg  20 mg Oral Daily Reinhold Rickey, Madie Reno, MD   20 mg at 12/08/21 [id number]   hydrOXYzine (ATARAX) tablet 50 mg  50 mg Oral TID PRN Shacoria Latif, Madie Reno, MD       ibuprofen (ADVIL) tablet 600 mg  600 mg Oral Q6H PRN Kenzlei Runions, Madie Reno, MD   600 mg at 12/08/21 0853   LORazepam (ATIVAN) tablet 2 mg  2 mg Oral Q4H PRN Arlee Santosuosso, Madie Reno, MD       Or   LORazepam (ATIVAN) injection 2 mg  2 mg Intramuscular Q4H PRN Kayanna Mckillop T, MD       magnesium hydroxide (MILK OF MAGNESIA) suspension 30 mL  30 mL Oral Daily PRN Jonathin Heinicke T, MD       nicotine (NICODERM CQ - dosed in mg/24 hours) patch 14 mg  14 mg Transdermal Daily Zanasia Hickson T, MD   14 mg at 12/08/21 0853   pantoprazole (PROTONIX) EC tablet 40 mg  40 mg Oral Daily Donaldo Teegarden T, MD   40 mg at 12/08/21 1059   QUEtiapine (SEROQUEL) tablet 300 mg  300 mg Oral QHS Junius Faucett T, MD       ziprasidone (GEODON) injection 20 mg  20 mg Intramuscular Q12H PRN Janiya Millirons T, MD   20 mg at 12/07/21  1759   PTA Medications: Medications Prior to Admission  Medication Sig Dispense Refill Last Dose   buPROPion (WELLBUTRIN XL) 300 MG 24 hr tablet Take 1 tablet (300  mg total) by mouth daily. 30 tablet 1    FLUoxetine (PROZAC) 20 MG capsule Take 1 capsule (20 mg total) by mouth daily. 30 capsule 1    QUEtiapine (SEROQUEL) 300 MG tablet Take 1 tablet (300 mg total) by mouth at bedtime. 30 tablet 1     Musculoskeletal: Strength & Muscle Tone: within normal limits Gait & Station: normal Patient leans: N/A            Psychiatric Specialty Exam:  Presentation  General Appearance: Casual  Eye Contact:Fair  Speech:Clear and Coherent; Slow  Speech Volume:Normal  Handedness:Right   Mood and Affect  Mood:Depressed; Dysphoric  Affect:Restricted   Thought Process  Thought Processes:Linear  Duration of Psychotic Symptoms: Less than six months  Past Diagnosis of Schizophrenia or Psychoactive disorder: Yes  Descriptions of Associations:Circumstantial  Orientation:Partial  Thought Content:Paranoid Ideation  Hallucinations:No data recorded Ideas of Reference:Paranoia  Suicidal Thoughts:No data recorded Homicidal Thoughts:No data recorded  Sensorium  Memory:Immediate Fair; Recent Fair; Remote Fair  Judgment:Impaired  Insight:Shallow   Executive Functions  Concentration:Fair  Attention Span:Fair  Recall:Fair  Fund of Knowledge:Fair  Language:Fair   Psychomotor Activity  Psychomotor Activity:No data recorded  Assets  Assets:Communication Skills; Desire for Improvement; Physical Health   Sleep  Sleep:No data recorded   Physical Exam: Physical Exam Vitals and nursing note reviewed.  Constitutional:      Appearance: Normal appearance.  HENT:     Head: Normocephalic and atraumatic.     Mouth/Throat:     Pharynx: Oropharynx is clear.  Eyes:     Pupils: Pupils are equal, round, and reactive to light.  Cardiovascular:     Rate and Rhythm:  Normal rate and regular rhythm.  Pulmonary:     Effort: Pulmonary effort is normal.     Breath sounds: Normal breath sounds.  Abdominal:     General: Abdomen is flat.     Palpations: Abdomen is soft.  Musculoskeletal:        General: Normal range of motion.  Skin:    General: Skin is warm and dry.  Neurological:     General: No focal deficit present.     Mental Status: He is alert. Mental status is at baseline.  Psychiatric:        Attention and Perception: Attention normal. He perceives auditory hallucinations.        Mood and Affect: Mood is depressed. Affect is blunt.        Speech: Speech is delayed.        Behavior: Behavior is slowed.        Thought Content: Thought content includes suicidal ideation.        Cognition and Memory: Memory is impaired.        Judgment: Judgment is impulsive.   Review of Systems  Constitutional: Negative.   HENT: Negative.    Eyes: Negative.   Respiratory: Negative.    Cardiovascular: Negative.   Gastrointestinal: Negative.   Musculoskeletal: Negative.   Skin: Negative.   Neurological: Negative.   Psychiatric/Behavioral:  Positive for depression, hallucinations, substance abuse and suicidal ideas. The patient is nervous/anxious and has insomnia.   Blood pressure 113/64, pulse 77, temperature 98.5 F (36.9 C), temperature source Oral, resp. rate 18, height 6\' 3"  (1.905 m), weight 103.9 kg, SpO2 100 %. Body mass index is 28.62 kg/m.  Treatment Plan Summary: Medication management and Plan patient was admitted and placed back on his medicine for depression and psychosis.  Blood pressure medicine reinstated.  Labs all  reviewed.  Anything that needs to be done again will be checked.  We will talk with him about whether ECT might be useful to continue.  Encourage group attendance.  Treatment team tomorrow.  Observation Level/Precautions:  15 minute checks  Laboratory:  UDS  Psychotherapy:    Medications:    Consultations:    Discharge  Concerns:    Estimated LOS:  Other:     Physician Treatment Plan for Primary Diagnosis: Schizoaffective disorder, depressive type ([place]) Long Term Goal(s): Improvement in symptoms so as ready for discharge  Short Term Goals: Ability to verbalize feelings will improve, Ability to disclose and discuss suicidal ideas, and Ability to demonstrate self-control will improve  Physician Treatment Plan for Secondary Diagnosis: Principal Problem:   Schizoaffective disorder, depressive type ([place]) Active Problems:   Cannabis use disorder, moderate, dependence ([place])   Homelessness  Long Term Goal(s): Improvement in symptoms so as ready for discharge  Short Term Goals: Compliance with prescribed medications will improve  I certify that inpatient services furnished can reasonably be expected to improve the patient's condition.    Alethia Berthold, MD 6/6/20231:41 PM

## 2021-12-08 NOTE — Progress Notes (Signed)
Patient came up to the nurses station and told staff that he feels as if people are touching him all over, he is hearing faint voices and seeing shadows. MD will be notified.

## 2021-12-08 NOTE — Plan of Care (Signed)
D- Patient alert and oriented. Patient presented in a pained, but pleasant mood on assessment stating that he "tossed and turned" last night and had continued complaints of pain. Patient rated his pain level a "7/10", in which he stated the PRN Tylenol was of no help, so he requested PRN Ibuprofen from this Clinical research associate. Patient endorsed SI/HI on assessment, but he did not go into detail about why he's feeling this way. Patient reported VH, stating that he sees "spots and shadows". Patient reports having depression and anxiety, but could not rate it on a numerical scale. Patient stated "I don't want to be here". Patient denied AH at this time. Patient had no stated goals for today.  A- Scheduled medications administered to patient, per MD orders. Support and encouragement provided.  Routine safety checks conducted every 15 minutes.  Patient informed to notify staff with problems or concerns.  R- No adverse drug reactions noted. Patient contracts for safety at this time. Patient compliant with medications and treatment plan. Patient receptive, calm, and cooperative. Patient interacts well with others on the unit.  Patient remains safe at this time.  Problem: Activity: Goal: Will verbalize the importance of balancing activity with adequate rest periods Outcome: Not Progressing   Problem: Education: Goal: Will be free of psychotic symptoms Outcome: Not Progressing Goal: Knowledge of the prescribed therapeutic regimen will improve Outcome: Not Progressing   Problem: Coping: Goal: Coping ability will improve Outcome: Not Progressing Goal: Will verbalize feelings Outcome: Not Progressing   Problem: Health Behavior/Discharge Planning: Goal: Compliance with prescribed medication regimen will improve Outcome: Not Progressing   Problem: Nutritional: Goal: Ability to achieve adequate nutritional intake will improve Outcome: Not Progressing   Problem: Role Relationship: Goal: Ability to communicate  needs accurately will improve Outcome: Not Progressing Goal: Ability to interact with others will improve Outcome: Not Progressing   Problem: Safety: Goal: Ability to redirect hostility and anger into socially appropriate behaviors will improve Outcome: Not Progressing Goal: Ability to remain free from injury will improve Outcome: Not Progressing   Problem: Self-Care: Goal: Ability to participate in self-care as condition permits will improve Outcome: Not Progressing   Problem: Self-Concept: Goal: Will verbalize positive feelings about self Outcome: Not Progressing   Problem: Education: Goal: Utilization of techniques to improve thought processes will improve Outcome: Not Progressing Goal: Knowledge of the prescribed therapeutic regimen will improve Outcome: Not Progressing   Problem: Activity: Goal: Interest or engagement in leisure activities will improve Outcome: Not Progressing Goal: Imbalance in normal sleep/wake cycle will improve Outcome: Not Progressing   Problem: Coping: Goal: Coping ability will improve Outcome: Not Progressing Goal: Will verbalize feelings Outcome: Not Progressing   Problem: Health Behavior/Discharge Planning: Goal: Ability to make decisions will improve Outcome: Not Progressing Goal: Compliance with therapeutic regimen will improve Outcome: Not Progressing   Problem: Role Relationship: Goal: Will demonstrate positive changes in social behaviors and relationships Outcome: Not Progressing   Problem: Safety: Goal: Ability to disclose and discuss suicidal ideas will improve Outcome: Not Progressing Goal: Ability to identify and utilize support systems that promote safety will improve Outcome: Not Progressing   Problem: Self-Concept: Goal: Will verbalize positive feelings about self Outcome: Not Progressing Goal: Level of anxiety will decrease Outcome: Not Progressing   Problem: Education: Goal: Knowledge of [place] [facility]  Education information/materials will improve Outcome: Not Progressing Goal: Emotional status will improve Outcome: Not Progressing Goal: Mental status will improve Outcome: Not Progressing Goal: Verbalization of understanding the information provided will improve Outcome: Not  Progressing   Problem: Activity: Goal: Interest or engagement in activities will improve Outcome: Not Progressing Goal: Sleeping patterns will improve Outcome: Not Progressing   Problem: Coping: Goal: Ability to verbalize frustrations and anger appropriately will improve Outcome: Not Progressing Goal: Ability to demonstrate self-control will improve Outcome: Not Progressing   Problem: Health Behavior/Discharge Planning: Goal: Identification of resources available to assist in meeting health care needs will improve Outcome: Not Progressing Goal: Compliance with treatment plan for underlying cause of condition will improve Outcome: Not Progressing   Problem: Physical Regulation: Goal: Ability to maintain clinical measurements within normal limits will improve Outcome: Not Progressing   Problem: Safety: Goal: Periods of time without injury will increase Outcome: Not Progressing

## 2021-12-08 NOTE — Progress Notes (Signed)
Recreation Therapy Notes    Date: 12/07/2021   Time: 10:15 am   Location: Craft room    Behavioral response: N/A   Intervention Topic: Values    Discussion/Intervention: Patient refused to attend group.    Clinical Observations/Feedback:  Patient refused to attend group.    Latham Kinzler LRT/CTRS          Russell Quinney 12/08/2021 10:27 AM

## 2021-12-08 NOTE — Progress Notes (Signed)
Pt given PRN medication for agitation as this nurse was walking onto the unit. Pt was responding to internal stimuli. Pt has been asleep since getting the medication. Pt being monitored Q 15 minutes for safety per unit protocol. Pt remains safe on the unit.

## 2021-12-08 NOTE — Progress Notes (Signed)
Patient came to nurses station stating that he was having some chest pain. This Clinical research associate obtained a set of vitals and they were WNL: BP 113/64 HR 77 R 18. MD was notified and new orders were placed for EKG and PRN medication.

## 2021-12-08 NOTE — BHH Suicide Risk Assessment (Signed)
[facility] Admission Suicide Risk Assessment   Nursing information obtained from:  Patient Demographic factors:  Male, Unemployed Current Mental Status:  Self-harm thoughts Loss Factors:  NA Historical Factors:  NA Risk Reduction Factors:  NA  Total Time spent with patient: 1 hour Principal Problem: Schizoaffective disorder, depressive type ([place]) Diagnosis:  Principal Problem:   Schizoaffective disorder, depressive type ([place]) Active Problems:   Cannabis use disorder, moderate, dependence ([place])   Homelessness  Subjective Data: Patient seen and chart reviewed.  26 year old man with a history of schizoaffective disorder was admitted to the hospital after presenting in [place] with rapidly agitating psychosis and negative thoughts including suicidal ideation.  On interview today the patient continues to endorse suicidal thoughts and auditory hallucinations although he is cooperative with treatment and not showing any active behavior problems  Continued Clinical Symptoms:  Alcohol Use Disorder Identification Test Final Score (AUDIT): 0 The "Alcohol Use Disorders Identification Test", Guidelines for Use in Primary Care, Second Edition.  World Pharmacologist [facility]). Score between 0-7:  no or low risk or alcohol related problems. Score between 8-15:  moderate risk of alcohol related problems. Score between 16-19:  high risk of alcohol related problems. Score 20 or above:  warrants further diagnostic evaluation for alcohol dependence and treatment.   CLINICAL FACTORS:   Schizophrenia:   Depressive state   Musculoskeletal: Strength & Muscle Tone: within normal limits Gait & Station: normal Patient leans: N/A  Psychiatric Specialty Exam:  Presentation  General Appearance: Casual  Eye Contact:Fair  Speech:Clear and Coherent; Slow  Speech Volume:Normal  Handedness:Right   Mood and Affect  Mood:Depressed; Dysphoric  Affect:Restricted   Thought Process  Thought  Processes:Linear  Descriptions of Associations:Circumstantial  Orientation:Partial  Thought Content:Paranoid Ideation  History of Schizophrenia/Schizoaffective disorder:Yes  Duration of Psychotic Symptoms:Less than six months  Hallucinations:No data recorded Ideas of Reference:Paranoia  Suicidal Thoughts:No data recorded Homicidal Thoughts:No data recorded  Sensorium  Memory:Immediate Fair; Recent Fair; Remote Fair  Judgment:Impaired  Insight:Shallow   Executive Functions  Concentration:Fair  Attention Span:Fair  [place]   Psychomotor Activity  Psychomotor Activity:No data recorded  Assets  Assets:Communication Skills; Desire for Improvement; Physical Health   Sleep  Sleep:No data recorded   Physical Exam: Physical Exam Vitals and nursing note reviewed.  Constitutional:      Appearance: Normal appearance.  HENT:     Head: Normocephalic and atraumatic.     Mouth/Throat:     Pharynx: Oropharynx is clear.  Eyes:     Pupils: Pupils are equal, round, and reactive to light.  Cardiovascular:     Rate and Rhythm: Normal rate and regular rhythm.  Pulmonary:     Effort: Pulmonary effort is normal.     Breath sounds: Normal breath sounds.  Abdominal:     General: Abdomen is flat.     Palpations: Abdomen is soft.  Musculoskeletal:        General: Normal range of motion.  Skin:    General: Skin is warm and dry.  Neurological:     General: No focal deficit present.     Mental Status: He is alert. Mental status is at baseline.  Psychiatric:        Attention and Perception: Attention normal. He perceives auditory hallucinations.        Mood and Affect: Mood normal. Affect is blunt.        Speech: Speech is delayed.        Behavior: Behavior is slowed.  Thought Content: Thought content includes suicidal ideation.        Cognition and Memory: Cognition normal.        Judgment: Judgment is impulsive.    Review of Systems  Constitutional: Negative.   HENT: Negative.    Eyes: Negative.   Respiratory: Negative.    Cardiovascular: Negative.   Gastrointestinal: Negative.   Musculoskeletal: Negative.   Skin: Negative.   Neurological: Negative.   Psychiatric/Behavioral:  Positive for depression, hallucinations and suicidal ideas. The patient is nervous/anxious and has insomnia.   Blood pressure 113/64, pulse 77, temperature 98.5 F (36.9 C), temperature source Oral, resp. rate 18, height 6\' 3"  (1.905 m), weight 103.9 kg, SpO2 100 %. Body mass index is 28.62 kg/m.   COGNITIVE FEATURES THAT CONTRIBUTE TO RISK:  None    SUICIDE RISK:   Mild:  Suicidal ideation of limited frequency, intensity, duration, and specificity.  There are no identifiable plans, no associated intent, mild dysphoria and related symptoms, good self-control (both objective and subjective assessment), few other risk factors, and identifiable protective factors, including available and accessible social support.  PLAN OF CARE: Continue 15-minute checks.  Continue psychiatric medicine consistent with what we were using before.  Review overall plan with patient.  Encouraged him to be attending groups regularly.  Consider the possibility of ECT at some point if he is not showing rapid improvement.  Ongoing assessment of dangerousness prior to discharge  I certify that inpatient services furnished can reasonably be expected to improve the patient's condition.   Alethia Berthold, MD 12/08/2021, 1:31 PM
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
## 2021-12-09 DIAGNOSIS — F251 Schizoaffective disorder, depressive type: Secondary | ICD-10-CM | POA: Diagnosis not present

## 2021-12-09 MED ORDER — QUETIAPINE FUMARATE 200 MG PO TABS
300.0000 mg | ORAL_TABLET | Freq: Every day | ORAL | Status: DC
  Administered 2021-12-09 – 2021-12-12 (×3): 300 mg via ORAL
  Filled 2021-12-09 (×3): qty 1

## 2021-12-09 MED ORDER — QUETIAPINE FUMARATE 200 MG PO TABS: 400.0000 mg | ORAL_TABLET | Freq: Every day | ORAL | Status: DC

## 2021-12-09 NOTE — Progress Notes (Signed)
Patient calm and pleasant tonight. Pt endorses passive SI, verbally contracts for safety. Pt denies HI/AVH. Pt doesn't appear to be responding to internal stimuli tonight. Pt compliant with medication administration per MD orders. Pt more active on the unit tonight than what he has been this admission. Pt given education, support and encouragement to be active in his treatment plan. Pt being monitored Q 15 minutes for safety per unit protocol. Pt remains safe on the unit.

## 2021-12-09 NOTE — Plan of Care (Signed)
D- Patient alert and oriented. Patient presented in a sullen, but pleasant mood on assessment reporting that he slept poorly last night stating that he was having "awful/crazy dreams, I tossed and turned". Patient continues to endorse passive SI, stating "my mind is racing, it's crazy", but he does contract with this Clinical research associate and he states that he feels safe on the unit. Patient denied HI, AVH at the time of assessment. Patient has been receiving PRN pain medication for his knee pain. Per patient's self-inventory, his goal for today is to "stay here to better myself for my sake of my mental health and also take my medicine as needed daily to better my health". In order for him to achieve his goals, patient will "talk to my technician about what I am going through during the day". Patient also wants staff to know that he needs help with "the things I am going through mentally".  A- Scheduled medications administered to patient, per MD orders. Support and encouragement provided.  Routine safety checks conducted every 15 minutes.  Patient informed to notify staff with problems or concerns.  R- No adverse drug reactions noted. Patient contracts for safety at this time. Patient compliant with medications and treatment plan. Patient receptive, calm, and cooperative. Patient interacts well with others on the unit.  Patient remains safe at this time.  Problem: Activity: Goal: Will verbalize the importance of balancing activity with adequate rest periods Outcome: Not Progressing   Problem: Education: Goal: Will be free of psychotic symptoms Outcome: Not Progressing Goal: Knowledge of the prescribed therapeutic regimen will improve Outcome: Not Progressing   Problem: Coping: Goal: Coping ability will improve Outcome: Not Progressing Goal: Will verbalize feelings Outcome: Not Progressing   Problem: Health Behavior/Discharge Planning: Goal: Compliance with prescribed medication regimen will  improve Outcome: Not Progressing   Problem: Nutritional: Goal: Ability to achieve adequate nutritional intake will improve Outcome: Not Progressing   Problem: Role Relationship: Goal: Ability to communicate needs accurately will improve Outcome: Not Progressing Goal: Ability to interact with others will improve Outcome: Not Progressing   Problem: Safety: Goal: Ability to redirect hostility and anger into socially appropriate behaviors will improve Outcome: Not Progressing Goal: Ability to remain free from injury will improve Outcome: Not Progressing   Problem: Self-Care: Goal: Ability to participate in self-care as condition permits will improve Outcome: Not Progressing   Problem: Self-Concept: Goal: Will verbalize positive feelings about self Outcome: Not Progressing   Problem: Education: Goal: Utilization of techniques to improve thought processes will improve Outcome: Not Progressing Goal: Knowledge of the prescribed therapeutic regimen will improve Outcome: Not Progressing   Problem: Activity: Goal: Interest or engagement in leisure activities will improve Outcome: Not Progressing Goal: Imbalance in normal sleep/wake cycle will improve Outcome: Not Progressing   Problem: Coping: Goal: Coping ability will improve Outcome: Not Progressing Goal: Will verbalize feelings Outcome: Not Progressing   Problem: Health Behavior/Discharge Planning: Goal: Ability to make decisions will improve Outcome: Not Progressing Goal: Compliance with therapeutic regimen will improve Outcome: Not Progressing   Problem: Role Relationship: Goal: Will demonstrate positive changes in social behaviors and relationships Outcome: Not Progressing   Problem: Safety: Goal: Ability to disclose and discuss suicidal ideas will improve Outcome: Not Progressing Goal: Ability to identify and utilize support systems that promote safety will improve Outcome: Not Progressing   Problem:  Self-Concept: Goal: Will verbalize positive feelings about self Outcome: Not Progressing Goal: Level of anxiety will decrease Outcome: Not Progressing   Problem: Education: Goal: Knowledge  of [place] [facility] Education information/materials will improve Outcome: Not Progressing Goal: Emotional status will improve Outcome: Not Progressing Goal: Mental status will improve Outcome: Not Progressing Goal: Verbalization of understanding the information provided will improve Outcome: Not Progressing   Problem: Activity: Goal: Interest or engagement in activities will improve Outcome: Not Progressing Goal: Sleeping patterns will improve Outcome: Not Progressing   Problem: Coping: Goal: Ability to verbalize frustrations and anger appropriately will improve Outcome: Not Progressing Goal: Ability to demonstrate self-control will improve Outcome: Not Progressing   Problem: Health Behavior/Discharge Planning: Goal: Identification of resources available to assist in meeting health care needs will improve Outcome: Not Progressing Goal: Compliance with treatment plan for underlying cause of condition will improve Outcome: Not Progressing   Problem: Physical Regulation: Goal: Ability to maintain clinical measurements within normal limits will improve Outcome: Not Progressing   Problem: Safety: Goal: Periods of time without injury will increase Outcome: Not Progressing

## 2021-12-09 NOTE — Progress Notes (Signed)
Recreation Therapy Notes  Date: 12/09/2021   Time: 11:00 am    Location: Craft room     Behavioral response: Appropriate   Intervention Topic: Time management    Discussion/Intervention:  Group content today was focused on time management. The group defined time management and identified healthy ways to manage time. Individuals expressed how much of the 24 hours they use in a day. Patients expressed how much time they use just for themselves personally. The group expressed how they have managed their time in the past. Individuals participated in the intervention "Managing Life" where they had a chance to see how much of the 24 hours they use and where it goes. Clinical Observations/Feedback: Patient came to group and defined time management as doing things daily by planning. He time management is important relax more. Participant identified that he normally spends 6-12 hours of his day in a productive manner. Individual was social with peers and staff while participating in the intervention.  Terek Bee LRT/CTRS           Anaisa Radi 12/09/2021 12:44 PM

## 2021-12-09 NOTE — BH IP Treatment Plan (Signed)
Interdisciplinary Treatment and Diagnostic Plan Update  12/09/2021 Time of Session: 9:30AM Timothy [name] MRN: [id number]  Principal Diagnosis: Schizoaffective disorder, depressive type ([place])  Secondary Diagnoses: Principal Problem:   Schizoaffective disorder, depressive type ([place]) Active Problems:   Cannabis use disorder, moderate, dependence ([place])   Homelessness   Current Medications:  Current Facility-Administered Medications  Medication Dose Route Frequency Provider Last Rate Last Admin   acetaminophen (TYLENOL) tablet 650 mg  650 mg Oral Q6H PRN Clapacs, Madie Reno, MD   650 mg at 12/09/21 0821   alum & mag hydroxide-simeth (MAALOX/MYLANTA) 200-200-20 MG/5ML suspension 30 mL  30 mL Oral Q4H PRN Clapacs, Madie Reno, MD       buPROPion (WELLBUTRIN XL) 24 hr tablet 300 mg  300 mg Oral Daily Clapacs, John T, MD   300 mg at 12/09/21 [id number]   FLUoxetine (PROZAC) capsule 20 mg  20 mg Oral Daily Clapacs, Madie Reno, MD   20 mg at 12/09/21 [id number]   hydrOXYzine (ATARAX) tablet 50 mg  50 mg Oral TID PRN Clapacs, Madie Reno, MD       ibuprofen (ADVIL) tablet 600 mg  600 mg Oral Q6H PRN Clapacs, Madie Reno, MD   600 mg at 12/09/21 0543   LORazepam (ATIVAN) tablet 2 mg  2 mg Oral Q4H PRN Clapacs, Madie Reno, MD       Or   LORazepam (ATIVAN) injection 2 mg  2 mg Intramuscular Q4H PRN Clapacs, John T, MD       magnesium hydroxide (MILK OF MAGNESIA) suspension 30 mL  30 mL Oral Daily PRN Clapacs, John T, MD       nicotine (NICODERM CQ - dosed in mg/24 hours) patch 14 mg  14 mg Transdermal Daily Clapacs, John T, MD   14 mg at 12/09/21 [id number]   pantoprazole (PROTONIX) EC tablet 40 mg  40 mg Oral Daily Clapacs, John T, MD   40 mg at 12/09/21 [id number]   QUEtiapine (SEROQUEL) tablet 300 mg  300 mg Oral QHS Clapacs, John T, MD       ziprasidone (GEODON) injection 20 mg  20 mg Intramuscular Q12H PRN Clapacs, Madie Reno, MD   20 mg at 12/08/21 1909   PTA Medications: Medications Prior to Admission  Medication Sig Dispense Refill Last Dose    buPROPion (WELLBUTRIN XL) 300 MG 24 hr tablet Take 1 tablet (300 mg total) by mouth daily. 30 tablet 1    FLUoxetine (PROZAC) 20 MG capsule Take 1 capsule (20 mg total) by mouth daily. 30 capsule 1    QUEtiapine (SEROQUEL) 300 MG tablet Take 1 tablet (300 mg total) by mouth at bedtime. 30 tablet 1     Patient Stressors: Financial difficulties   Marital or family conflict   Medication change or noncompliance    Patient Strengths: Motivation for treatment/growth   Treatment Modalities: Medication Management, Group therapy, Case management,  1 to 1 session with clinician, Psychoeducation, Recreational therapy.   Physician Treatment Plan for Primary Diagnosis: Schizoaffective disorder, depressive type ([place]) Long Term Goal(s): Improvement in symptoms so as ready for discharge   Short Term Goals: Compliance with prescribed medications will improve Ability to verbalize feelings will improve Ability to disclose and discuss suicidal ideas Ability to demonstrate self-control will improve  Medication Management: Evaluate patient's response, side effects, and tolerance of medication regimen.  Therapeutic Interventions: 1 to 1 sessions, Unit Group sessions and Medication administration.  Evaluation of Outcomes: Not Progressing  Physician Treatment Plan for Secondary Diagnosis: Principal Problem:  Schizoaffective disorder, depressive type ([facility]) Active Problems:   Cannabis use disorder, moderate, dependence ([facility])   Homelessness  Long Term Goal(s): Improvement in symptoms so as ready for discharge   Short Term Goals: Compliance with prescribed medications will improve Ability to verbalize feelings will improve Ability to disclose and discuss suicidal ideas Ability to demonstrate self-control will improve     Medication Management: Evaluate patient's response, side effects, and tolerance of medication regimen.  Therapeutic Interventions: 1 to 1 sessions, Unit Group sessions and  Medication administration.  Evaluation of Outcomes: Not Progressing   RN Treatment Plan for Primary Diagnosis: Schizoaffective disorder, depressive type (HCC) Long Term Goal(s): Knowledge of disease and therapeutic regimen to maintain health will improve  Short Term Goals: Ability to remain free from injury will improve, Ability to verbalize frustration and anger appropriately will improve, Ability to demonstrate self-control, Ability to participate in decision making will improve, Ability to verbalize feelings will improve, Ability to disclose and discuss suicidal ideas, Ability to identify and develop effective coping behaviors will improve, and Compliance with prescribed medications will improve  Medication Management: RN will administer medications as ordered by provider, will assess and evaluate patient's response and provide education to patient for prescribed medication. RN will report any adverse and/or side effects to prescribing provider.  Therapeutic Interventions: 1 on 1 counseling sessions, Psychoeducation, Medication administration, Evaluate responses to treatment, Monitor vital signs and CBGs as ordered, Perform/monitor CIWA, COWS, AIMS and Fall Risk screenings as ordered, Perform wound care treatments as ordered.  Evaluation of Outcomes: Not Progressing   LCSW Treatment Plan for Primary Diagnosis: Schizoaffective disorder, depressive type (HCC) Long Term Goal(s): Safe transition to appropriate next level of care at discharge, Engage patient in therapeutic group addressing interpersonal concerns.  Short Term Goals: Engage patient in aftercare planning with referrals and resources, Increase social support, Increase ability to appropriately verbalize feelings, Increase emotional regulation, Facilitate acceptance of mental health diagnosis and concerns, and Increase skills for wellness and recovery  Therapeutic Interventions: Assess for all discharge needs, 1 to 1 time with Social  worker, Explore available resources and support systems, Assess for adequacy in community support network, Educate family and significant other(s) on suicide prevention, Complete Psychosocial Assessment, Interpersonal group therapy.  Evaluation of Outcomes: Not Progressing   Progress in Treatment: Attending groups: Yes. Participating in groups: Yes. Taking medication as prescribed: Yes. Toleration medication: Yes. Family/Significant other contact made: No, will contact:  once permission is given. Patient understands diagnosis: Yes. Discussing patient identified problems/goals with staff: Yes. Medical problems stabilized or resolved: Yes. Denies suicidal/homicidal ideation: No. Issues/concerns per patient self-inventory: No. Other: none  New problem(s) identified: No, Describe:  none  New Short Term/Long Term Goal(s): elimination of symptoms of psychosis, medication management for mood stabilization; elimination of SI thoughts; development of comprehensive mental wellness/sobriety plan.   Patient Goals:  "I'm just here to get myself together"  Discharge Plan or Barriers: CSW will continue to meet with the patient and development appropriate discharge plans for the patient.   Reason for Continuation of Hospitalization: Aggression Anxiety Depression Hallucinations Medical Issues Medication stabilization Suicidal ideation  Estimated Length of Stay:  1-7 days  Last 3 [place] Suicide Severity Risk Score: Flowsheet Row Admission (Current) from 12/07/2021 in [facility] INPATIENT BE[facility]ICINE ED from 12[facility]03/2021 in [facility]  HOSPITAL-EMERGENCY DEPT Admission (Discharged) from 11/01/2021 in [facility] INPATIENT BE[facility] MEDICINE  C-SSRS RISK CATEGORY High Risk No Risk No Risk       Last PHQ 2/9 Scores:  12/31/2019   10:13 AM  Depression screen PHQ 2/9  Decreased Interest 2  Down, Depressed, Hopeless 1  PHQ - 2 Score 3  Altered sleeping 3  Tired, decreased energy 3   Trouble concentrating 1  Moving slowly or fidgety/restless 2  Suicidal thoughts 2  PHQ-9 Score 14  Difficult doing work/chores Very difficult    Scribe for Treatment Team: Rozann Lesches, LCSW 12/09/2021 10:31 AM

## 2021-12-09 NOTE — Progress Notes (Signed)
[facility] MD Progress Note  12/09/2021 1:56 PM Timothy [name]  MRN:  [id number] Subjective: Patient seen in treatment team today.  Quite talkative.  Reports he is still having hallucinations and that his thoughts feel disorganized.  Labile affect.  Crying at 1 point laughing at another.  Not sleeping very well.  Last night had a spell of agitation requiring some additional medicine. Principal Problem: Schizoaffective disorder, depressive type (HCC) Diagnosis: Principal Problem:   Schizoaffective disorder, depressive type (HCC) Active Problems:   Cannabis use disorder, moderate, dependence (HCC)   Homelessness  Total Time spent with patient: 30 minutes  Past Psychiatric History: Past history of schizoaffective disorder primarily with depressive symptoms although agitation intermittently as well to  Past Medical History:  Past Medical History:  Diagnosis Date   Cannabis abuse    Paranoid schizophrenia (HCC)    Schizoaffective disorder, depressive type (HCC) 09/17/2014   History reviewed. No pertinent surgical history. Family History:  Family History  Problem Relation Age of Onset   Mental illness Brother    Drug abuse Brother    Mental illness Cousin    Suicidality Cousin    Diabetes Maternal Grandmother    Family Psychiatric  History: See previous Social History:  Social History   Substance and Sexual Activity  Alcohol Use Yes   Alcohol/week: 2.0 standard drinks   Types: 2 Cans of beer per week     Social History   Substance and Sexual Activity  Drug Use Yes   Types: Marijuana, MDMA Chiropodist)    Social History   Socioeconomic History   Marital status: Single    Spouse name: Not on file   Number of children: Not on file   Years of education: Not on file   Highest education level: Not on file  Occupational History   Not on file  Tobacco Use   Smoking status: Some Days    Packs/day: 0.25    Types: Cigarettes   Smokeless tobacco: Never  Vaping Use   Vaping Use: Never  used  Substance and Sexual Activity   Alcohol use: Yes    Alcohol/week: 2.0 standard drinks    Types: 2 Cans of beer per week   Drug use: Yes    Types: Marijuana, MDMA (Ecstacy)   Sexual activity: Yes  Other Topics Concern   Not on file  Social History Narrative   Not on file   Social Determinants of Health   Financial Resource Strain: Not on file  Food Insecurity: Not on file  Transportation Needs: Not on file  Physical Activity: Not on file  Stress: Not on file  Social Connections: Not on file   Additional Social History:                         Sleep: Poor  Appetite:  Poor  Current Medications: Current Facility-Administered Medications  Medication Dose Route Frequency Provider Last Rate Last Admin   acetaminophen (TYLENOL) tablet 650 mg  650 mg Oral Q6H PRN Elisabella Hacker T, MD   650 mg at 12/09/21 0821   alum & mag hydroxide-simeth (MAALOX/MYLANTA) 200-200-20 MG/5ML suspension 30 mL  30 mL Oral Q4H PRN Kegan Mckeithan T, MD       buPROPion (WELLBUTRIN XL) 24 hr tablet 300 mg  300 mg Oral Daily Nashae Maudlin T, MD   300 mg at 12/09/21 1448   FLUoxetine (PROZAC) capsule 20 mg  20 mg Oral Daily Easton Sivertson, Jackquline Denmark, MD   20  mg at 12/09/21 8786   hydrOXYzine (ATARAX) tablet 50 mg  50 mg Oral TID PRN Cuahutemoc Attar, Jackquline Denmark, MD       ibuprofen (ADVIL) tablet 600 mg  600 mg Oral Q6H PRN Teigen Parslow, Jackquline Denmark, MD   600 mg at 12/09/21 0543   LORazepam (ATIVAN) tablet 2 mg  2 mg Oral Q4H PRN Ramisa Duman, Jackquline Denmark, MD       Or   LORazepam (ATIVAN) injection 2 mg  2 mg Intramuscular Q4H PRN Coal Nearhood T, MD       magnesium hydroxide (MILK OF MAGNESIA) suspension 30 mL  30 mL Oral Daily PRN Teliyah Royal T, MD       nicotine (NICODERM CQ - dosed in mg/24 hours) patch 14 mg  14 mg Transdermal Daily Herma Uballe, Jackquline Denmark, MD   14 mg at 12/09/21 [id number]   pantoprazole (PROTONIX) EC tablet 40 mg  40 mg Oral Daily Lela Gell, Jackquline Denmark, MD   40 mg at 12/09/21 0947   QUEtiapine (SEROQUEL) tablet 400 mg  400 mg Oral  QHS Derra Shartzer T, MD       ziprasidone (GEODON) injection 20 mg  20 mg Intramuscular Q12H PRN Xareni Kelch, Jackquline Denmark, MD   20 mg at 12/08/21 1909    Lab Results: No results found for this or any previous visit (from the past 48 hour(s)).  Blood Alcohol level:  Lab Results  Component Value Date   ETH <10 12/03/2021   ETH <10 10/31/2021    Metabolic Disorder Labs: Lab Results  Component Value Date   HGBA1C 5.5 11/04/2021   MPG 111.15 11/04/2021   MPG 114 06/17/2021   Lab Results  Component Value Date   PROLACTIN 33.2 (H) 08/15/2016   Lab Results  Component Value Date   CHOL 149 11/04/2021   TRIG 56 11/04/2021   HDL 35 (L) 11/04/2021   CHOLHDL 4.3 11/04/2021   VLDL 11 11/04/2021   LDLCALC 103 (H) 11/04/2021   LDLCALC 111 (H) 06/17/2021    Physical Findings: AIMS:  , ,  ,  ,    CIWA:    COWS:     Musculoskeletal: Strength & Muscle Tone: within normal limits Gait & Station: normal Patient leans: N/A  Psychiatric Specialty Exam:  Presentation  General Appearance: Casual  Eye Contact:Fair  Speech:Clear and Coherent; Slow  Speech Volume:Normal  Handedness:Right   Mood and Affect  Mood:Depressed; Dysphoric  Affect:Restricted   Thought Process  Thought Processes:Linear  Descriptions of Associations:Circumstantial  Orientation:Partial  Thought Content:Paranoid Ideation  History of Schizophrenia/Schizoaffective disorder:Yes  Duration of Psychotic Symptoms:Less than six months  Hallucinations:No data recorded Ideas of Reference:Paranoia  Suicidal Thoughts:No data recorded Homicidal Thoughts:No data recorded  Sensorium  Memory:Immediate Fair; Recent Fair; Remote Fair  Judgment:Impaired  Insight:Shallow   Executive Functions  Concentration:Fair  Attention Span:Fair  Recall:Fair  Fund of Knowledge:Fair  Language:Fair   Psychomotor Activity  Psychomotor Activity:No data recorded  Assets  Assets:Communication Skills; Desire for  Improvement; Physical Health   Sleep  Sleep:No data recorded   Physical Exam: Physical Exam Vitals and nursing note reviewed.  Constitutional:      Appearance: Normal appearance.  HENT:     Head: Normocephalic and atraumatic.     Mouth/Throat:     Pharynx: Oropharynx is clear.  Eyes:     Pupils: Pupils are equal, round, and reactive to light.  Cardiovascular:     Rate and Rhythm: Normal rate and regular rhythm.  Pulmonary:     Effort: Pulmonary  effort is normal.     Breath sounds: Normal breath sounds.  Abdominal:     General: Abdomen is flat.     Palpations: Abdomen is soft.  Musculoskeletal:        General: Normal range of motion.  Skin:    General: Skin is warm and dry.  Neurological:     General: No focal deficit present.     Mental Status: He is alert. Mental status is at baseline.  Psychiatric:        Attention and Perception: Attention normal. He perceives auditory hallucinations.        Mood and Affect: Mood normal. Affect is labile.        Speech: Speech is tangential.        Behavior: Behavior is agitated. Behavior is not aggressive.        Thought Content: Thought content is paranoid.        Cognition and Memory: Cognition is impaired. Memory is impaired.        Judgment: Judgment is inappropriate.   Review of Systems  Constitutional: Negative.   HENT: Negative.    Eyes: Negative.   Respiratory: Negative.    Cardiovascular: Negative.   Gastrointestinal: Negative.   Musculoskeletal: Negative.   Skin: Negative.   Neurological: Negative.   Psychiatric/Behavioral:  Positive for depression, hallucinations, substance abuse and suicidal ideas. The patient is nervous/anxious and has insomnia.   Blood pressure 132/62, pulse 65, temperature 98.5 F (36.9 C), temperature source Oral, resp. rate 18, height 6\' 3"  (1.905 m), weight 103.9 kg, SpO2 94 %. Body mass index is 28.62 kg/m.   Treatment Plan Summary: Medication management and Plan patient is once again  having psychotic symptoms mood instability behavior problems and clearly would not functioning well outside the hospital.  More animated than he was last time making me a little more concerned about the possibility of manic like symptoms but he is still endorsing mostly depression and psychosis.  I was advised that he had not taken his Seroquel the last couple days which is certainly a big problem.  I will not change the doses of anything for now.  I have held off recommending ECT until we see how he is for a couple days.  Reassess that tomorrow.  Mordecai RasmussenJohn Graycee Greeson, MD 12/09/2021, 1:56 PM

## 2021-12-10 DIAGNOSIS — F251 Schizoaffective disorder, depressive type: Secondary | ICD-10-CM | POA: Diagnosis not present

## 2021-12-10 MED ORDER — DIVALPROEX SODIUM 500 MG PO DR TAB
500.0000 mg | DELAYED_RELEASE_TABLET | Freq: Two times a day (BID) | ORAL | Status: DC
  Administered 2021-12-10 – 2022-01-07 (×56): 500 mg via ORAL
  Filled 2021-12-10 (×56): qty 1

## 2021-12-10 NOTE — Progress Notes (Signed)
[facility] MD Progress Note  12/10/2021 2:29 PM Timothy [name]  MRN:  [id number] Subjective: Follow-up 26 year old man with schizoaffective disorder.  Labile this time much more than last time.  More active.  Sometimes up out of bed moving around sometimes back in his room.  Better eye contact today.  Continues to describe himself as being hopeless depressed and having suicidal thoughts. Principal Problem: Schizoaffective disorder, depressive type (HCC) Diagnosis: Principal Problem:   Schizoaffective disorder, depressive type (HCC) Active Problems:   Cannabis use disorder, moderate, dependence (HCC)   Homelessness  Total Time spent with patient: 30 minutes  Past Psychiatric History: Past history of multiple hospitalizations going back several years.  A lot of them with depression and psychosis and an ongoing diagnosis of schizoaffective disorder.  Most stabilized.  Seems to have been on lithium as well as Seroquel.  Past Medical History:  Past Medical History:  Diagnosis Date   Cannabis abuse    Paranoid schizophrenia (HCC)    Schizoaffective disorder, depressive type (HCC) 09/17/2014   History reviewed. No pertinent surgical history. Family History:  Family History  Problem Relation Age of Onset   Mental illness Brother    Drug abuse Brother    Mental illness Cousin    Suicidality Cousin    Diabetes Maternal Grandmother    Family Psychiatric  History: See previous Social History:  Social History   Substance and Sexual Activity  Alcohol Use Yes   Alcohol/week: 2.0 standard drinks of alcohol   Types: 2 Cans of beer per week     Social History   Substance and Sexual Activity  Drug Use Yes   Types: Marijuana, MDMA Chiropodist)    Social History   Socioeconomic History   Marital status: Single    Spouse name: Not on file   Number of children: Not on file   Years of education: Not on file   Highest education level: Not on file  Occupational History   Not on file  Tobacco Use    Smoking status: Some Days    Packs/day: 0.25    Types: Cigarettes   Smokeless tobacco: Never  Vaping Use   Vaping Use: Never used  Substance and Sexual Activity   Alcohol use: Yes    Alcohol/week: 2.0 standard drinks of alcohol    Types: 2 Cans of beer per week   Drug use: Yes    Types: Marijuana, MDMA (Ecstacy)   Sexual activity: Yes  Other Topics Concern   Not on file  Social History Narrative   Not on file   Social Determinants of Health   Financial Resource Strain: Not on file  Food Insecurity: Not on file  Transportation Needs: Not on file  Physical Activity: Inactive (12/31/2019)   Exercise Vital Sign    Days of Exercise per Week: 0 days    Minutes of Exercise per Session: 0 min  Stress: Stress Concern Present (12/31/2019)   [employer] of Occupational Health - Occupational Stress Questionnaire    Feeling of Stress : Rather much  Social Connections: Moderately Isolated (12/31/2019)   Social Connection and Isolation Panel [NHANES]    Frequency of Communication with Friends and Family: Once a week    Frequency of Social Gatherings with Friends and Family: Once a week    Attends Religious Services: More than 4 times per year    Active Member of [employer] or Organizations: Yes    Attends Banker Meetings: More than 4 times per year  Marital Status: Never married   Additional Social History:                         Sleep: Fair  Appetite:  Fair  Current Medications: Current Facility-Administered Medications  Medication Dose Route Frequency Provider Last Rate Last Admin   acetaminophen (TYLENOL) tablet 650 mg  650 mg Oral Q6H PRN Jolane Bankhead, Jackquline DenmarkJohn T, MD   650 mg at 12/09/21 2132   alum & mag hydroxide-simeth (MAALOX/MYLANTA) 200-200-20 MG/5ML suspension 30 mL  30 mL Oral Q4H PRN Kenley Troop, Jackquline DenmarkJohn T, MD       buPROPion (WELLBUTRIN XL) 24 hr tablet 300 mg  300 mg Oral Daily Toris Laverdiere T, MD   300 mg at 12/10/21 0736   divalproex (DEPAKOTE) DR tablet  500 mg  500 mg Oral Q12H Casilda Pickerill, Jackquline DenmarkJohn T, MD   500 mg at 12/10/21 1422   hydrOXYzine (ATARAX) tablet 50 mg  50 mg Oral TID PRN Qiana Landgrebe, Jackquline DenmarkJohn T, MD       ibuprofen (ADVIL) tablet 600 mg  600 mg Oral Q6H PRN Belvie Iribe, Jackquline DenmarkJohn T, MD   600 mg at 12/09/21 2331   LORazepam (ATIVAN) tablet 2 mg  2 mg Oral Q4H PRN Kenton Fortin, Jackquline DenmarkJohn T, MD       Or   LORazepam (ATIVAN) injection 2 mg  2 mg Intramuscular Q4H PRN Jaeden Westbay T, MD       magnesium hydroxide (MILK OF MAGNESIA) suspension 30 mL  30 mL Oral Daily PRN Octivia Canion T, MD       nicotine (NICODERM CQ - dosed in mg/24 hours) patch 14 mg  14 mg Transdermal Daily Ivanka Kirshner T, MD   14 mg at 12/10/21 0736   pantoprazole (PROTONIX) EC tablet 40 mg  40 mg Oral Daily Chryl Holten, Jackquline DenmarkJohn T, MD   40 mg at 12/10/21 0736   QUEtiapine (SEROQUEL) tablet 300 mg  300 mg Oral QHS Kristin Barcus T, MD   300 mg at 12/09/21 2132   ziprasidone (GEODON) injection 20 mg  20 mg Intramuscular Q12H PRN Tywana Robotham, Jackquline DenmarkJohn T, MD   20 mg at 12/08/21 1909    Lab Results: No results found for this or any previous visit (from the past 48 hour(s)).  Blood Alcohol level:  Lab Results  Component Value Date   ETH <10 12/03/2021   ETH <10 10/31/2021    Metabolic Disorder Labs: Lab Results  Component Value Date   HGBA1C 5.5 11/04/2021   MPG 111.15 11/04/2021   MPG 114 06/17/2021   Lab Results  Component Value Date   PROLACTIN 33.2 (H) 08/15/2016   Lab Results  Component Value Date   CHOL 149 11/04/2021   TRIG 56 11/04/2021   HDL 35 (L) 11/04/2021   CHOLHDL 4.3 11/04/2021   VLDL 11 11/04/2021   LDLCALC 103 (H) 11/04/2021   LDLCALC 111 (H) 06/17/2021    Physical Findings: AIMS:  , ,  ,  ,    CIWA:    COWS:     Musculoskeletal: Strength & Muscle Tone: within normal limits Gait & Station: normal Patient leans: N/A  Psychiatric Specialty Exam:  Presentation  General Appearance: Casual  Eye Contact:Fair  Speech:Clear and Coherent; Slow  Speech  Volume:Normal  Handedness:Right   Mood and Affect  Mood:Depressed; Dysphoric  Affect:Restricted   Thought Process  Thought Processes:Linear  Descriptions of Associations:Circumstantial  Orientation:Partial  Thought Content:Paranoid Ideation  History of Schizophrenia/Schizoaffective disorder:Yes  Duration of Psychotic Symptoms:Less than six  months  Hallucinations:No data recorded Ideas of Reference:Paranoia  Suicidal Thoughts:No data recorded Homicidal Thoughts:No data recorded  Sensorium  Memory:Immediate Fair; Recent Fair; Remote Fair  Judgment:Impaired  Insight:Shallow   Executive Functions  Concentration:Fair  Attention Span:Fair  Recall:Fair  Fund of Knowledge:Fair  Language:Fair   Psychomotor Activity  Psychomotor Activity:No data recorded  Assets  Assets:Communication Skills; Desire for Improvement; Physical Health   Sleep  Sleep:No data recorded   Physical Exam: Physical Exam Vitals and nursing note reviewed.  Constitutional:      Appearance: Normal appearance.  HENT:     Head: Normocephalic and atraumatic.     Mouth/Throat:     Pharynx: Oropharynx is clear.  Eyes:     Pupils: Pupils are equal, round, and reactive to light.  Cardiovascular:     Rate and Rhythm: Normal rate and regular rhythm.  Pulmonary:     Effort: Pulmonary effort is normal.     Breath sounds: Normal breath sounds.  Abdominal:     General: Abdomen is flat.     Palpations: Abdomen is soft.  Musculoskeletal:        General: Normal range of motion.  Skin:    General: Skin is warm and dry.  Neurological:     General: No focal deficit present.     Mental Status: He is alert. Mental status is at baseline.  Psychiatric:        Attention and Perception: Attention normal.        Mood and Affect: Mood is depressed.        Speech: Speech is delayed.        Behavior: Behavior is withdrawn.        Thought Content: Thought content includes suicidal ideation.         Cognition and Memory: Memory is impaired.    Review of Systems  Constitutional: Negative.   HENT: Negative.    Eyes: Negative.   Respiratory: Negative.    Cardiovascular: Negative.   Gastrointestinal: Negative.   Musculoskeletal: Negative.   Skin: Negative.   Neurological: Negative.   Psychiatric/Behavioral:  Positive for depression, hallucinations, substance abuse and suicidal ideas. The patient is nervous/anxious and has insomnia.    Blood pressure 114/74, pulse 88, temperature 99.3 F (37.4 C), temperature source Oral, resp. rate 20, height 6\' 3"  (1.905 m), weight 103.9 kg, SpO2 99 %. Body mass index is 28.62 kg/m.   Treatment Plan Summary: Medication management and Plan I reviewed his chart a bit and talked with him about it.  It looks like only a limited number of medicines have been tried but that Depakote combined with Seroquel had probably been most effective.  As of this morning he was taking 2 antidepressants.  Reviewed with him a plan to restart the Depakote while stopping fluoxetine.  Patient agreeable.  Continue 15-minute checks continue involvement in group and social activities  , MD 12/10/2021, 2:29 PM

## 2021-12-10 NOTE — Progress Notes (Signed)
Pt isolates to his room most of the time and in bed. He denies SI/HI. He reports hearing voices and seeing things. He would not tell me what exactly he was seeing or hearing. Pt very quiet and guarded with communication. Affect sad and hopeless. He reports chest pain score 5/10, but would not give any other details. I informed the doctor if this compliant. He denied SOB, no sweating, no nausea , arm or jaw pain. Pt able to contract for safety.

## 2021-12-10 NOTE — Progress Notes (Signed)
Out of room for meds. Refused snack tonight. Isolative to self and room. Denies any SI, HI, AVH. Presents flat but brightens on approach. Complains of backache, prn given with good relief. Encouragement and support provided. Safety checks maintained. Medications given as prescribed, pt receptive and remains safe on unit with q 15 min checks.

## 2021-12-10 NOTE — Plan of Care (Signed)
See progress note. Problem: Activity: Goal: Will verbalize the importance of balancing activity with adequate rest periods Outcome: Progressing   Problem: Education: Goal: Will be free of psychotic symptoms Outcome: Progressing Goal: Knowledge of the prescribed therapeutic regimen will improve Outcome: Progressing   Problem: Coping: Goal: Coping ability will improve Outcome: Progressing Goal: Will verbalize feelings Outcome: Progressing   Problem: Health Behavior/Discharge Planning: Goal: Compliance with prescribed medication regimen will improve Outcome: Progressing   Problem: Nutritional: Goal: Ability to achieve adequate nutritional intake will improve Outcome: Progressing   Problem: Role Relationship: Goal: Ability to communicate needs accurately will improve Outcome: Progressing Goal: Ability to interact with others will improve Outcome: Progressing   Problem: Safety: Goal: Ability to redirect hostility and anger into socially appropriate behaviors will improve Outcome: Progressing Goal: Ability to remain free from injury will improve Outcome: Progressing   Problem: Self-Care: Goal: Ability to participate in self-care as condition permits will improve Outcome: Progressing   Problem: Self-Concept: Goal: Will verbalize positive feelings about self Outcome: Progressing   Problem: Education: Goal: Utilization of techniques to improve thought processes will improve Outcome: Progressing Goal: Knowledge of the prescribed therapeutic regimen will improve Outcome: Progressing   Problem: Activity: Goal: Interest or engagement in leisure activities will improve Outcome: Progressing Goal: Imbalance in normal sleep/wake cycle will improve Outcome: Progressing   Problem: Coping: Goal: Coping ability will improve Outcome: Progressing Goal: Will verbalize feelings Outcome: Progressing   Problem: Health Behavior/Discharge Planning: Goal: Ability to make decisions  will improve Outcome: Progressing Goal: Compliance with therapeutic regimen will improve Outcome: Progressing   Problem: Role Relationship: Goal: Will demonstrate positive changes in social behaviors and relationships Outcome: Progressing   Problem: Safety: Goal: Ability to disclose and discuss suicidal ideas will improve Outcome: Progressing Goal: Ability to identify and utilize support systems that promote safety will improve Outcome: Progressing   Problem: Self-Concept: Goal: Will verbalize positive feelings about self Outcome: Progressing Goal: Level of anxiety will decrease Outcome: Progressing   Problem: Education: Goal: Knowledge of [place] [facility] Education information/materials will improve Outcome: Progressing Goal: Emotional status will improve Outcome: Progressing Goal: Mental status will improve Outcome: Progressing Goal: Verbalization of understanding the information provided will improve Outcome: Progressing   Problem: Activity: Goal: Interest or engagement in activities will improve Outcome: Progressing Goal: Sleeping patterns will improve Outcome: Progressing   Problem: Coping: Goal: Ability to verbalize frustrations and anger appropriately will improve Outcome: Progressing Goal: Ability to demonstrate self-control will improve Outcome: Progressing   Problem: Health Behavior/Discharge Planning: Goal: Identification of resources available to assist in meeting health care needs will improve Outcome: Progressing Goal: Compliance with treatment plan for underlying cause of condition will improve Outcome: Progressing   Problem: Physical Regulation: Goal: Ability to maintain clinical measurements within normal limits will improve Outcome: Progressing   Problem: Safety: Goal: Periods of time without injury will increase Outcome: Progressing   Problem: Education: Goal: Knowledge of General Education information will improve Description:  Including pain rating scale, medication(s)/side effects and non-pharmacologic comfort measures Outcome: Progressing   Problem: Health Behavior/Discharge Planning: Goal: Ability to manage health-related needs will improve Outcome: Progressing   Problem: Clinical Measurements: Goal: Ability to maintain clinical measurements within normal limits will improve Outcome: Progressing Goal: Will remain free from infection Outcome: Progressing Goal: Diagnostic test results will improve Outcome: Progressing Goal: Respiratory complications will improve Outcome: Progressing Goal: Cardiovascular complication will be avoided Outcome: Progressing   Problem: Activity: Goal: Risk for activity intolerance will decrease Outcome: Progressing  Problem: Nutrition: Goal: Adequate nutrition will be maintained Outcome: Progressing   Problem: Coping: Goal: Level of anxiety will decrease Outcome: Progressing   

## 2021-12-10 NOTE — [facility] Counselor (Signed)
Patient provided verbal permission for CSW to reach out to [employer] to check on possibility of going to this shelter at discharge.  Assunta Curtis, MSW, LCSW 12/10/2021 1:57 PM

## 2021-12-10 NOTE — Plan of Care (Signed)
  Problem: Education: Goal: Will be free of psychotic symptoms Outcome: Progressing Goal: Knowledge of the prescribed therapeutic regimen will improve Outcome: Progressing   Problem: Coping: Goal: Coping ability will improve Outcome: Progressing   Problem: Safety: Goal: Ability to redirect hostility and anger into socially appropriate behaviors will improve Outcome: Progressing

## 2021-12-10 NOTE — Progress Notes (Signed)
Recreation Therapy Notes  Date: 12/10/2021   Time: 10:45am   Location: Court yard     Behavioral response: N/A   Intervention Topic: Decision Making     Discussion/Intervention: Patient refused to attend group.    Clinical Observations/Feedback:  Patient refused to attend group.    Keyandre Pileggi LRT/CTRS          Arlyn Bumpus 12/10/2021 10:57 AM

## 2021-12-10 NOTE — Group Note (Signed)
[facility] LCSW Group Therapy Note   Group Date: 12/10/2021 Start Time: 1300 End Time: 1400   Type of Therapy/Topic:  Group Therapy:  Emotion Regulation  Participation Level:  Active   Mood:  Description of Group:    The purpose of this group is to assist patients in learning to regulate negative emotions and experience positive emotions. Patients will be guided to discuss ways in which they have been vulnerable to their negative emotions. These vulnerabilities will be juxtaposed with experiences of positive emotions or situations, and patients challenged to use positive emotions to combat negative ones. Special emphasis will be placed on coping with negative emotions in conflict situations, and patients will process healthy conflict resolution skills.  Therapeutic Goals: Patient will identify two positive emotions or experiences to reflect on in order to balance out negative emotions:  Patient will label two or more emotions that they find the most difficult to experience:  Patient will be able to demonstrate positive conflict resolution skills through discussion or role plays:   Summary of Patient Progress: Patient was present in group.  Patient was an active participant. Patient's disposition was much improved from disposition in previous group. Patient was able to identify the emotions that he has been feeling as well as what he needs to do to improve his disposition.  Patient was able to identify that he experiences visual hallucinations that are like "I just jumped in a ice cold lake".  He reported that he struggles with "moving past those".  He reports plans to go to a shelter at discharge.   Therapeutic Modalities:   Cognitive Behavioral Therapy Feelings Identification Dialectical Behavioral Therapy   Rozann Lesches, LCSW

## 2021-12-10 NOTE — Progress Notes (Signed)
Recreation Therapy Notes  INPATIENT RECREATION THERAPY ASSESSMENT  Patient Details Name: Timothy [name] MRN: [id number] DOB: August 26, 1995 Today's Date: 12/10/2021       Information Obtained From: Patient  Able to Participate in Assessment/Interview: Yes  Patient Presentation: Responsive  Reason for Admission (Per Patient): Active Symptoms  Patient Stressors: Other (Comment), Work (Life)  Coping Skills:   Film/video editor, Avoidance, Music  Leisure Interests (2+):  Exercise - Walking, Music - Listen, Games - Video games, Individual - TV, Sports - Basketball  Frequency of Recreation/Participation:    Awareness of Community Resources:  Yes  Community Resources:  Park, Engineering geologist, [employer]  Current Use: Yes  If no, Barriers?:    Expressed Interest in [employer] Information: Yes  [employer] of Residence:  [place]  Patient Main Form of Transportation: Therapist, music  Patient Strengths:  N/A  Patient Identified Areas of Improvement:  working  Patient Goal for Hospitalization:  Getting things right  Current SI (including self-harm):  No  Current HI:  No  Current AVH: Yes  Staff Intervention Plan: Group Attendance, Collaborate with Interdisciplinary Treatment Team  Consent to Intern Participation: N/A  Timothy [name] 12/10/2021, 4:09 PM

## 2021-12-11 DIAGNOSIS — F251 Schizoaffective disorder, depressive type: Secondary | ICD-10-CM | POA: Diagnosis not present

## 2021-12-11 NOTE — Progress Notes (Signed)
[facility] MD Progress Note  12/11/2021 5:08 PM Timothy [name]  MRN:  [id number] Subjective: Follow-up patient with schizoaffective disorder.  Patient has been out in the milieu and interacting more.  Still describes himself as depressed and still has some suicidal thoughts intermittently.  Occasional hallucinations.  Behavior however has been calm.  No aggression today. Principal Problem: Schizoaffective disorder, depressive type (HCC) Diagnosis: Principal Problem:   Schizoaffective disorder, depressive type (HCC) Active Problems:   Cannabis use disorder, moderate, dependence (HCC)   Homelessness  Total Time spent with patient: 30 minutes  Past Psychiatric History: Past history of recurrent schizoaffective disorder  Past Medical History:  Past Medical History:  Diagnosis Date   Cannabis abuse    Paranoid schizophrenia (HCC)    Schizoaffective disorder, depressive type (HCC) 09/17/2014   History reviewed. No pertinent surgical history. Family History:  Family History  Problem Relation Age of Onset   Mental illness Brother    Drug abuse Brother    Mental illness Cousin    Suicidality Cousin    Diabetes Maternal Grandmother    Family Psychiatric  History: See previous Social History:  Social History   Substance and Sexual Activity  Alcohol Use Yes   Alcohol/week: 2.0 standard drinks of alcohol   Types: 2 Cans of beer per week     Social History   Substance and Sexual Activity  Drug Use Yes   Types: Marijuana, MDMA Chiropodist)    Social History   Socioeconomic History   Marital status: Single    Spouse name: Not on file   Number of children: Not on file   Years of education: Not on file   Highest education level: Not on file  Occupational History   Not on file  Tobacco Use   Smoking status: Some Days    Packs/day: 0.25    Types: Cigarettes   Smokeless tobacco: Never  Vaping Use   Vaping Use: Never used  Substance and Sexual Activity   Alcohol use: Yes     Alcohol/week: 2.0 standard drinks of alcohol    Types: 2 Cans of beer per week   Drug use: Yes    Types: Marijuana, MDMA (Ecstacy)   Sexual activity: Yes  Other Topics Concern   Not on file  Social History Narrative   Not on file   Social Determinants of Health   Financial Resource Strain: Low Risk  (12/31/2019)   Overall Financial Resource Strain (CARDIA)    Difficulty of Paying Living Expenses: Not hard at all  Food Insecurity: No Food Insecurity (12/31/2019)   Hunger Vital Sign    Worried About Running Out of Food in the Last Year: Never true    Ran Out of Food in the Last Year: Never true  Transportation Needs: No Transportation Needs (12/31/2019)   PRAPARE - Administrator, Civil Service (Medical): No    Lack of Transportation (Non-Medical): No  Physical Activity: Inactive (12/31/2019)   Exercise Vital Sign    Days of Exercise per Week: 0 days    Minutes of Exercise per Session: 0 min  Stress: Stress Concern Present (12/31/2019)   [employer] of Occupational Health - Occupational Stress Questionnaire    Feeling of Stress : Rather much  Social Connections: Moderately Isolated (12/31/2019)   Social Connection and Isolation Panel [NHANES]    Frequency of Communication with Friends and Family: Once a week    Frequency of Social Gatherings with Friends and Family: Once a week  Attends Religious Services: More than 4 times per year    Active Member of Clubs or Organizations: Yes    Attends BankerClub or Organization Meetings: More than 4 times per year    Marital Status: Never married   Additional Social History:                         Sleep: Fair  Appetite:  Fair  Current Medications: Current Facility-Administered Medications  Medication Dose Route Frequency Provider Last Rate Last Admin   acetaminophen (TYLENOL) tablet 650 mg  650 mg Oral Q6H PRN Mykeria Garman T, MD   650 mg at 12/11/21 0819   alum & mag hydroxide-simeth (MAALOX/MYLANTA) 200-200-20  MG/5ML suspension 30 mL  30 mL Oral Q4H PRN Yug Loria T, MD       buPROPion (WELLBUTRIN XL) 24 hr tablet 300 mg  300 mg Oral Daily Neka Bise T, MD   300 mg at 12/11/21 0818   divalproex (DEPAKOTE) DR tablet 500 mg  500 mg Oral Q12H Coral Soler T, MD   500 mg at 12/11/21 0818   hydrOXYzine (ATARAX) tablet 50 mg  50 mg Oral TID PRN Ardie Mclennan, Jackquline DenmarkJohn T, MD   50 mg at 12/11/21 1117   ibuprofen (ADVIL) tablet 600 mg  600 mg Oral Q6H PRN Tycen Dockter T, MD   600 mg at 12/11/21 1118   LORazepam (ATIVAN) tablet 2 mg  2 mg Oral Q4H PRN Oluwadarasimi Redmon T, MD   2 mg at 12/11/21 1200   Or   LORazepam (ATIVAN) injection 2 mg  2 mg Intramuscular Q4H PRN Kenn Rekowski, Jackquline DenmarkJohn T, MD       magnesium hydroxide (MILK OF MAGNESIA) suspension 30 mL  30 mL Oral Daily PRN Tametha Banning T, MD       nicotine (NICODERM CQ - dosed in mg/24 hours) patch 14 mg  14 mg Transdermal Daily Aliah Eriksson T, MD   14 mg at 12/11/21 0820   pantoprazole (PROTONIX) EC tablet 40 mg  40 mg Oral Daily Kennya Schwenn T, MD   40 mg at 12/11/21 0818   QUEtiapine (SEROQUEL) tablet 300 mg  300 mg Oral QHS Anaiz Qazi T, MD   300 mg at 12/10/21 2058   ziprasidone (GEODON) injection 20 mg  20 mg Intramuscular Q12H PRN Brenley Priore, Jackquline DenmarkJohn T, MD   20 mg at 12/08/21 1909    Lab Results: No results found for this or any previous visit (from the past 48 hour(s)).  Blood Alcohol level:  Lab Results  Component Value Date   ETH <10 12/03/2021   ETH <10 10/31/2021    Metabolic Disorder Labs: Lab Results  Component Value Date   HGBA1C 5.5 11/04/2021   MPG 111.15 11/04/2021   MPG 114 06/17/2021   Lab Results  Component Value Date   PROLACTIN 33.2 (H) 08/15/2016   Lab Results  Component Value Date   CHOL 149 11/04/2021   TRIG 56 11/04/2021   HDL 35 (L) 11/04/2021   CHOLHDL 4.3 11/04/2021   VLDL 11 11/04/2021   LDLCALC 103 (H) 11/04/2021   LDLCALC 111 (H) 06/17/2021    Physical Findings: AIMS:  , ,  ,  ,    CIWA:    COWS:      Musculoskeletal: Strength & Muscle Tone: within normal limits Gait & Station: normal Patient leans: N/A  Psychiatric Specialty Exam:  Presentation  General Appearance: Casual  Eye Contact:Fair  Speech:Clear and Coherent; Slow  Speech  Volume:Normal  Handedness:Right   Mood and Affect  Mood:Depressed; Dysphoric  Affect:Restricted   Thought Process  Thought Processes:Linear  Descriptions of Associations:Circumstantial  Orientation:Partial  Thought Content:Paranoid Ideation  History of Schizophrenia/Schizoaffective disorder:Yes  Duration of Psychotic Symptoms:Less than six months  Hallucinations:No data recorded Ideas of Reference:Paranoia  Suicidal Thoughts:No data recorded Homicidal Thoughts:No data recorded  Sensorium  Memory:Immediate Fair; Recent Fair; Remote Fair  Judgment:Impaired  Insight:Shallow   Executive Functions  Concentration:Fair  Attention Span:Fair  Recall:Fair  Fund of Knowledge:Fair  Language:Fair   Psychomotor Activity  Psychomotor Activity:No data recorded  Assets  Assets:Communication Skills; Desire for Improvement; Physical Health   Sleep  Sleep:No data recorded   Physical Exam: Physical Exam Vitals and nursing note reviewed.  Constitutional:      Appearance: Normal appearance.  HENT:     Head: Normocephalic and atraumatic.     Mouth/Throat:     Pharynx: Oropharynx is clear.  Eyes:     Pupils: Pupils are equal, round, and reactive to light.  Cardiovascular:     Rate and Rhythm: Normal rate and regular rhythm.  Pulmonary:     Effort: Pulmonary effort is normal.     Breath sounds: Normal breath sounds.  Abdominal:     General: Abdomen is flat.     Palpations: Abdomen is soft.  Musculoskeletal:        General: Normal range of motion.  Skin:    General: Skin is warm and dry.  Neurological:     General: No focal deficit present.     Mental Status: He is alert. Mental status is at baseline.   Psychiatric:        Attention and Perception: Attention normal. He perceives auditory hallucinations.        Mood and Affect: Mood is depressed.        Thought Content: Thought content includes suicidal ideation.        Cognition and Memory: Cognition normal.        Judgment: Judgment is impulsive.    Review of Systems  Constitutional: Negative.   HENT: Negative.    Eyes: Negative.   Respiratory: Negative.    Cardiovascular: Negative.   Gastrointestinal: Negative.   Musculoskeletal: Negative.   Skin: Negative.   Neurological: Negative.   Psychiatric/Behavioral:  Positive for depression and suicidal ideas. The patient is nervous/anxious.    Blood pressure (!) 134/57, pulse 87, temperature 98.9 F (37.2 C), temperature source Oral, resp. rate 20, height 6\' 3"  (1.905 m), weight 103.9 kg, SpO2 99 %. Body mass index is 28.62 kg/m.   Treatment Plan Summary: Medication management and Plan continue current medicine including the Depakote.  Encourage interaction on the unit.  Defer any plans for return to ECT at this point as he is looking better than he had been previously.  , MD 12/11/2021, 5:08 PM

## 2021-12-11 NOTE — Plan of Care (Signed)
D: Pt alert and oriented. Pt rates depression 6/10 and anxiety 0/10. Pt reports experiencing 8/10 Right knee pain at this time, prn medication given. Pt denies experiencing any HI, or AH at this time however endorses SI w/o plan while here. Pt states he can be safe while here and will notify staff if anything changes. Pt reports VH of seeing a glare.   Pt has been experiencing anxiety d/t another pt's outburst. Pt also states he has anxiety d/t not knowing where he's going to go once he discharges. Pt also shares he is experiencing racing thoughts about what he's got to do. Pt has been observed pacing the hallway at times d/t anxiety. Pt has been given prn medications as well as divisional activities such as coloring, journaling, and television to watch.  A: Scheduled medications administered to pt, per MD orders. Support and encouragement provided. Frequent verbal contact made. Routine safety checks conducted q15 minutes.   R: No adverse drug reactions noted. Pt verbally contracts for safety at this time. Pt compliant with medications. Pt interacts well with others on the unit, pt can be isolative at times. Pt remains safe at this time. Will continue to monitor.   Problem: Activity: Goal: Will verbalize the importance of balancing activity with adequate rest periods Outcome: Progressing   Problem: Coping: Goal: Will verbalize feelings Outcome: Progressing

## 2021-12-11 NOTE — Progress Notes (Signed)
Recreation Therapy Notes  Date: 12/11/2021   Time: 1:20pm     Location: Courtyard    Behavioral response: Appropriate   Intervention Topic: Wellness    Discussion/Intervention:  Group content today was focused on Wellness. The group defined wellness and some positive ways they make decisions for themselves. Individuals expressed reasons why they neglected any wellness in the past. Patients described ways to improve wellness skills in the future. The group explained what could happen if they did not do any wellness at all. Participants express how bad choices has affected them and others around them. Individual explained the importance of wellness. The group participated in the intervention "Testing my Wellness" where they had a chance to identify some of their weaknesses and strengths in wellness.  Clinical Observations/Feedback: Patient came to group and was able to explore and identify wellness activities that they can use outside of the hospital. Individual was social with peers and staff while participating in the intervention   [name] LRT/CTRS              Rayetta Veith 12/11/2021 2:38 PM

## 2021-12-11 NOTE — Group Note (Signed)
[facility] LCSW Group Therapy Note   Group Date: 12/11/2021 Start Time: 1030 End Time: 1130   Type of Therapy/Topic:  Group Therapy:  Balance in Life  Participation Level:  Did Not Attend   Description of Group:    This group will address the concept of balance and how it feels and looks when one is unbalanced. Patients will be encouraged to process areas in their lives that are out of balance, and identify reasons for remaining unbalanced. Facilitators will guide patients utilizing problem- solving interventions to address and correct the stressor making their life unbalanced. Understanding and applying boundaries will be explored and addressed for obtaining  and maintaining a balanced life. Patients will be encouraged to explore ways to assertively make their unbalanced needs known to significant others in their lives, using other group members and facilitator for support and feedback.  Therapeutic Goals: Patient will identify two or more emotions or situations they have that consume much of in their lives. Patient will identify signs/triggers that life has become out of balance:  Patient will identify two ways to set boundaries in order to achieve balance in their lives:  Patient will demonstrate ability to communicate their needs through discussion and/or role plays  Summary of Patient Progress: X   Therapeutic Modalities:   Cognitive Behavioral Therapy Solution-Focused Therapy Assertiveness Training   Timothy [name] Timothy Aalia Greulich, LCSW 

## 2021-12-12 MED ORDER — BACITRACIN-NEOMYCIN-POLYMYXIN OINTMENT TUBE
TOPICAL_OINTMENT | CUTANEOUS | Status: DC | PRN
  Administered 2021-12-19: 1 via TOPICAL
  Filled 2021-12-12: qty 14.17

## 2021-12-12 NOTE — [facility] Group Notes (Signed)
LCSW Wellness Group Note   12/12/2021 1:00pm  Type of Group and Topic: Psychoeducational Group:  Wellness  Participation Level:  did not attend  Description of Group  Wellness group introduces the topic and its focus on developing healthy habits across the spectrum and its relationship to a decrease in hospital admissions.  Six areas of wellness are discussed: physical, social spiritual, intellectual, occupational, and emotional.  Patients are asked to consider their current wellness habits and to identify areas of wellness where they are interested and able to focus on improvements.    Therapeutic Goals Patients will understand components of wellness and how they can positively impact overall health.  Patients will identify areas of wellness where they have developed good habits. Patients will identify areas of wellness where they would like to make improvements.    Summary of Patient Progress     Therapeutic Modalities: Cognitive Behavioral Therapy Psychoeducation    Neveyah Garzon Jon, LCSW   

## 2021-12-12 NOTE — Progress Notes (Signed)
Patient alert and oriented x 4 affect is flat, not receptive to staff, he isolates to room not  interacting with peers and staff, denies SI/HI/AVH.  Patient was not complaint with medications. 15 minutes safety checks maintained will continue to monitor

## 2021-12-12 NOTE — Tx Team (Signed)
Interdisciplinary Treatment and Diagnostic Plan Update  12/12/2021 Time of Session: 1420 Timothy [name] MRN: [id number]  Principal Diagnosis: Schizoaffective disorder, depressive type (HCC)  Secondary Diagnoses: Principal Problem:   Schizoaffective disorder, depressive type (HCC) Active Problems:   Cannabis use disorder, moderate, dependence (HCC)   Homelessness   Current Medications:  Current Facility-Administered Medications  Medication Dose Route Frequency Provider Last Rate Last Admin   acetaminophen (TYLENOL) tablet 650 mg  650 mg Oral Q6H PRN Clapacs, Jackquline Denmark, MD   650 mg at 12/12/21 0812   alum & mag hydroxide-simeth (MAALOX/MYLANTA) 200-200-20 MG/5ML suspension 30 mL  30 mL Oral Q4H PRN Clapacs, Jackquline Denmark, MD       buPROPion (WELLBUTRIN XL) 24 hr tablet 300 mg  300 mg Oral Daily Clapacs, John T, MD   300 mg at 12/12/21 0811   divalproex (DEPAKOTE) DR tablet 500 mg  500 mg Oral Q12H Clapacs, John T, MD   500 mg at 12/12/21 4431   hydrOXYzine (ATARAX) tablet 50 mg  50 mg Oral TID PRN Clapacs, Jackquline Denmark, MD   50 mg at 12/11/21 1117   ibuprofen (ADVIL) tablet 600 mg  600 mg Oral Q6H PRN Clapacs, Jackquline Denmark, MD   600 mg at 12/11/21 1118   LORazepam (ATIVAN) tablet 2 mg  2 mg Oral Q4H PRN Clapacs, Jackquline Denmark, MD   2 mg at 12/12/21 1016   Or   LORazepam (ATIVAN) injection 2 mg  2 mg Intramuscular Q4H PRN Clapacs, Jackquline Denmark, MD       magnesium hydroxide (MILK OF MAGNESIA) suspension 30 mL  30 mL Oral Daily PRN Clapacs, Jackquline Denmark, MD       neomycin-bacitracin-polymyxin (NEOSPORIN) ointment   Topical PRN Danelle Earthly, Kashif       nicotine (NICODERM CQ - dosed in mg/24 hours) patch 14 mg  14 mg Transdermal Daily Clapacs, John T, MD   14 mg at 12/12/21 0812   pantoprazole (PROTONIX) EC tablet 40 mg  40 mg Oral Daily Clapacs, Jackquline Denmark, MD   40 mg at 12/12/21 5400   QUEtiapine (SEROQUEL) tablet 300 mg  300 mg Oral QHS Clapacs, John T, MD   300 mg at 12/10/21 2058   ziprasidone (GEODON) injection 20 mg  20 mg  Intramuscular Q12H PRN Clapacs, John T, MD   20 mg at 12/08/21 1909   PTA Medications: Medications Prior to Admission  Medication Sig Dispense Refill Last Dose   buPROPion (WELLBUTRIN XL) 300 MG 24 hr tablet Take 1 tablet (300 mg total) by mouth daily. 30 tablet 1    FLUoxetine (PROZAC) 20 MG capsule Take 1 capsule (20 mg total) by mouth daily. 30 capsule 1    QUEtiapine (SEROQUEL) 300 MG tablet Take 1 tablet (300 mg total) by mouth at bedtime. 30 tablet 1     Patient Stressors: Financial difficulties   Marital or family conflict   Medication change or noncompliance    Patient Strengths: Motivation for treatment/growth   Treatment Modalities: Medication Management, Group therapy, Case management,  1 to 1 session with clinician, Psychoeducation, Recreational therapy.   Physician Treatment Plan for Primary Diagnosis: Schizoaffective disorder, depressive type (HCC) Long Term Goal(s): Improvement in symptoms so as ready for discharge   Short Term Goals: Compliance with prescribed medications will improve Ability to verbalize feelings will improve Ability to disclose and discuss suicidal ideas Ability to demonstrate self-control will improve  Medication Management: Evaluate patient's response, side effects, and tolerance of medication regimen.  Therapeutic Interventions:  1 to 1 sessions, Unit Group sessions and Medication administration.  Evaluation of Outcomes: Progressing  Physician Treatment Plan for Secondary Diagnosis: Principal Problem:   Schizoaffective disorder, depressive type (HCC) Active Problems:   Cannabis use disorder, moderate, dependence (HCC)   Homelessness  Long Term Goal(s): Improvement in symptoms so as ready for discharge   Short Term Goals: Compliance with prescribed medications will improve Ability to verbalize feelings will improve Ability to disclose and discuss suicidal ideas Ability to demonstrate self-control will improve     Medication  Management: Evaluate patient's response, side effects, and tolerance of medication regimen.  Therapeutic Interventions: 1 to 1 sessions, Unit Group sessions and Medication administration.  Evaluation of Outcomes: Progressing   RN Treatment Plan for Primary Diagnosis: Schizoaffective disorder, depressive type (HCC) Long Term Goal(s): Knowledge of disease and therapeutic regimen to maintain health will improve  Short Term Goals: Ability to identify and develop effective coping behaviors will improve and Compliance with prescribed medications will improve  Medication Management: RN will administer medications as ordered by provider, will assess and evaluate patient's response and provide education to patient for prescribed medication. RN will report any adverse and/or side effects to prescribing provider.  Therapeutic Interventions: 1 on 1 counseling sessions, Psychoeducation, Medication administration, Evaluate responses to treatment, Monitor vital signs and CBGs as ordered, Perform/monitor CIWA, COWS, AIMS and Fall Risk screenings as ordered, Perform wound care treatments as ordered.  Evaluation of Outcomes: Progressing   LCSW Treatment Plan for Primary Diagnosis: Schizoaffective disorder, depressive type (HCC) Long Term Goal(s): Safe transition to appropriate next level of care at discharge, Engage patient in therapeutic group addressing interpersonal concerns.  Short Term Goals: Engage patient in aftercare planning with referrals and resources and Increase skills for wellness and recovery  Therapeutic Interventions: Assess for all discharge needs, 1 to 1 time with Social worker, Explore available resources and support systems, Assess for adequacy in community support network, Educate family and significant other(s) on suicide prevention, Complete Psychosocial Assessment, Interpersonal group therapy.  Evaluation of Outcomes: Progressing   Progress in Treatment: Attending groups:  Yes. Participating in groups: Yes. Taking medication as prescribed: Yes. Toleration medication: Yes. Family/Significant other contact made: No, will contact:  mother Patient understands diagnosis: Yes. Discussing patient identified problems/goals with staff: Yes. Medical problems stabilized or resolved: Yes. Denies suicidal/homicidal ideation: Yes. Issues/concerns per patient self-inventory: No. Other:    New problem(s) identified: No, Describe:  none  New Short Term/Long Term Goal(s):  Patient Goals:    Discharge Plan or Barriers:   Reason for Continuation of Hospitalization: Medication stabilization  Estimated Length of Stay:1-3 days  Last 3 [place] Suicide Severity Risk Score: Flowsheet Row Admission (Current) from 12/07/2021 in [facility] ED from 12/03/2021 in [facility] Admission (Discharged) from 11/01/2021 in [facility] INPAT[facility]IORAL MEDICINE  C-SSRS RISK CATEGORY High Risk No Risk No Risk       Last PHQ 2/9 Scores:    12/31/2019   10:13 AM  Depression screen PHQ 2/9  Decreased Interest 2  Down, Depressed, Hopeless 1  PHQ - 2 Score 3  Altered sleeping 3  Tired, decreased energy 3  Trouble concentrating 1  Moving slowly or fidgety/restless 2  Suicidal thoughts 2  PHQ-9 Score 14  Difficult doing work/chores Very difficult    Scribe for Treatment Team: Wyn Quaker 12/12/2021 2:28 PM

## 2021-12-12 NOTE — Plan of Care (Signed)
D: Pt alert and oriented. Pt rates depression 6/10, hopelessness 5/10, and anxiety 7/10. Pt goal: "follow up with the doctor about health, about the few days I been here so far, I guess I am doing a little better just body pain." Pt reports energy level as low and concentration as being poor. Pt reports sleep last night as being poor. Pt did receive medications for sleep and did find them helpful. Pt reports experiencing 8/10 generalize body pain at this time, prn meds given as well as ice pack and not found helpful, MD notified and aware. Pt denies experiencing any AVH at this time however endorses SI/HI. Pt contracts for safety state he can be safe while here and while around others.  Pt can be observed in the milieu. When pt voices anxiety pt can be observed pacing and feeling restless. Pt is good about requesting prn medications for anxiety, which per pt is helpful at times. Pt also journals and listens to music as alternative coping mechanisms.   A: Scheduled medications administered to pt, per MD orders. Support and encouragement provided. Frequent verbal contact made. Routine safety checks conducted q15 minutes.   R: No adverse drug reactions noted. Pt verbally contracts for safety at this time. Pt compliant with medications and treatment plan. Pt interacts well with others on the unit. Pt remains safe at this time. Will continue to monitor.   Problem: Coping: Goal: Will verbalize feelings Outcome: Progressing   Problem: Health Behavior/Discharge Planning: Goal: Compliance with prescribed medication regimen will improve Outcome: Progressing

## 2021-12-12 NOTE — Progress Notes (Signed)
[facility] MD Progress Note  12/12/2021 9:51 AM Timothy [name]  MRN:  [id number] Subjective:  Patient alert and oriented x 4 affect is flat, not receptive to staff, he isolates to room not  interacting with peers and staff, denies SI/HI/AVH.  Patient reports feeling depressed with slight improvement. He has fleeting SI and walks the halls frequently. He had some difficulty sleeping last night and has fair appetite.  Principal Problem: Schizoaffective disorder, depressive type (HCC) Diagnosis: Principal Problem:   Schizoaffective disorder, depressive type (HCC) Active Problems:   Cannabis use disorder, moderate, dependence (HCC)   Homelessness  Total Time spent with patient: 20 minutes  Past Psychiatric History: schizoaffective disorder  Past Medical History:  Past Medical History:  Diagnosis Date   Cannabis abuse    Paranoid schizophrenia (HCC)    Schizoaffective disorder, depressive type (HCC) 09/17/2014   History reviewed. No pertinent surgical history. Family History:  Family History  Problem Relation Age of Onset   Mental illness Brother    Drug abuse Brother    Mental illness Cousin    Suicidality Cousin    Diabetes Maternal Grandmother    Family Psychiatric  History:  Social History:  Social History   Substance and Sexual Activity  Alcohol Use Yes   Alcohol/week: 2.0 standard drinks of alcohol   Types: 2 Cans of beer per week     Social History   Substance and Sexual Activity  Drug Use Yes   Types: Marijuana, MDMA Chiropodist)    Social History   Socioeconomic History   Marital status: Single    Spouse name: Not on file   Number of children: Not on file   Years of education: Not on file   Highest education level: Not on file  Occupational History   Not on file  Tobacco Use   Smoking status: Some Days    Packs/day: 0.25    Types: Cigarettes   Smokeless tobacco: Never  Vaping Use   Vaping Use: Never used  Substance and Sexual Activity   Alcohol use: Yes     Alcohol/week: 2.0 standard drinks of alcohol    Types: 2 Cans of beer per week   Drug use: Yes    Types: Marijuana, MDMA (Ecstacy)   Sexual activity: Yes  Other Topics Concern   Not on file  Social History Narrative   Not on file   Social Determinants of Health   Financial Resource Strain: Low Risk  (12/31/2019)   Overall Financial Resource Strain (CARDIA)    Difficulty of Paying Living Expenses: Not hard at all  Food Insecurity: No Food Insecurity (12/31/2019)   Hunger Vital Sign    Worried About Running Out of Food in the Last Year: Never true    Ran Out of Food in the Last Year: Never true  Transportation Needs: No Transportation Needs (12/31/2019)   PRAPARE - Administrator, Civil Service (Medical): No    Lack of Transportation (Non-Medical): No  Physical Activity: Inactive (12/31/2019)   Exercise Vital Sign    Days of Exercise per Week: 0 days    Minutes of Exercise per Session: 0 min  Stress: Stress Concern Present (12/31/2019)   [employer] of Occupational Health - Occupational Stress Questionnaire    Feeling of Stress : Rather much  Social Connections: Moderately Isolated (12/31/2019)   Social Connection and Isolation Panel [NHANES]    Frequency of Communication with Friends and Family: Once a week    Frequency of Social Gatherings with  Friends and Family: Once a week    Attends Religious Services: More than 4 times per year    Active Member of [employer] Financial[employer]ubs or Organizations: Yes    Attends Engineer, structuralClub or Organization Meetings: More than 4 times per year    Marital Status: Never married   Additional Social History:                         Sleep: Fair  Appetite:  Fair  Current Medications: Current Facility-Administered Medications  Medication Dose Route Frequency Provider Last Rate Last Admin   acetaminophen (TYLENOL) tablet 650 mg  650 mg Oral Q6H PRN Clapacs, John T, MD   650 mg at 12/12/21 0812   alum & mag hydroxide-simeth (MAALOX/MYLANTA) 200-200-20  MG/5ML suspension 30 mL  30 mL Oral Q4H PRN Clapacs, John T, MD       buPROPion (WELLBUTRIN XL) 24 hr tablet 300 mg  300 mg Oral Daily Clapacs, John T, MD   300 mg at 12/12/21 0811   divalproex (DEPAKOTE) DR tablet 500 mg  500 mg Oral Q12H Clapacs, John T, MD   500 mg at 12/12/21 16100811   hydrOXYzine (ATARAX) tablet 50 mg  50 mg Oral TID PRN Clapacs, Jackquline DenmarkJohn T, MD   50 mg at 12/11/21 1117   ibuprofen (ADVIL) tablet 600 mg  600 mg Oral Q6H PRN Clapacs, John T, MD   600 mg at 12/11/21 1118   LORazepam (ATIVAN) tablet 2 mg  2 mg Oral Q4H PRN Clapacs, John T, MD   2 mg at 12/11/21 1200   Or   LORazepam (ATIVAN) injection 2 mg  2 mg Intramuscular Q4H PRN Clapacs, Jackquline DenmarkJohn T, MD       magnesium hydroxide (MILK OF MAGNESIA) suspension 30 mL  30 mL Oral Daily PRN Clapacs, John T, MD       nicotine (NICODERM CQ - dosed in mg/24 hours) patch 14 mg  14 mg Transdermal Daily Clapacs, John T, MD   14 mg at 12/12/21 0812   pantoprazole (PROTONIX) EC tablet 40 mg  40 mg Oral Daily Clapacs, Jackquline DenmarkJohn T, MD   40 mg at 12/12/21 0811   QUEtiapine (SEROQUEL) tablet 300 mg  300 mg Oral QHS Clapacs, John T, MD   300 mg at 12/10/21 2058   ziprasidone (GEODON) injection 20 mg  20 mg Intramuscular Q12H PRN Clapacs, Jackquline DenmarkJohn T, MD   20 mg at 12/08/21 1909    Lab Results: No results found for this or any previous visit (from the past 48 hour(s)).  Blood Alcohol level:  Lab Results  Component Value Date   ETH <10 12/03/2021   ETH <10 10/31/2021    Metabolic Disorder Labs: Lab Results  Component Value Date   HGBA1C 5.5 11/04/2021   MPG 111.15 11/04/2021   MPG 114 06/17/2021   Lab Results  Component Value Date   PROLACTIN 33.2 (H) 08/15/2016   Lab Results  Component Value Date   CHOL 149 11/04/2021   TRIG 56 11/04/2021   HDL 35 (L) 11/04/2021   CHOLHDL 4.3 11/04/2021   VLDL 11 11/04/2021   LDLCALC 103 (H) 11/04/2021   LDLCALC 111 (H) 06/17/2021    Physical Findings: AIMS:  , ,  ,  ,    CIWA:    COWS:      Musculoskeletal: Strength & Muscle Tone: within normal limits Gait & Station: normal Patient leans: N/A  Psychiatric Specialty Exam:  Presentation  General Appearance: Casual  Eye Contact:Fair  Speech:Clear and Coherent; Slow  Speech Volume:Normal  Handedness:Right   Mood and Affect  Mood:Depressed; Dysphoric  Affect:Restricted   Thought Process  Thought Processes:Linear  Descriptions of Associations:Circumstantial  Orientation:Partial  Thought Content:Paranoid Ideation  History of Schizophrenia/Schizoaffective disorder:Yes  Duration of Psychotic Symptoms:Less than six months  Hallucinations:No data recorded Ideas of Reference:Paranoia  Suicidal Thoughts:No data recorded Homicidal Thoughts:No data recorded  Sensorium  Memory:Immediate Fair; Recent Fair; Remote Fair  Judgment:Impaired  Insight:Shallow   Executive Functions  Concentration:Fair  Attention Span:Fair  Recall:Fair  Fund of Knowledge:Fair  Language:Fair   Psychomotor Activity  Psychomotor Activity:No data recorded  Assets  Assets:Communication Skills; Desire for Improvement; Physical Health   Sleep  Sleep:No data recorded   Physical Exam: Physical Exam Constitutional:      Appearance: Normal appearance. He is normal weight.  HENT:     Head: Normocephalic and atraumatic.     Right Ear: External ear normal.     Left Ear: External ear normal.     Nose: Nose normal.     Mouth/Throat:     Mouth: Mucous membranes are moist.     Pharynx: Oropharynx is clear.  Eyes:     Extraocular Movements: Extraocular movements intact.     Conjunctiva/sclera: Conjunctivae normal.     Pupils: Pupils are equal, round, and reactive to light.  Cardiovascular:     Rate and Rhythm: Normal rate and regular rhythm.     Pulses: Normal pulses.     Heart sounds: Normal heart sounds.  Pulmonary:     Effort: Pulmonary effort is normal.     Breath sounds: Normal breath sounds.  Abdominal:      General: Abdomen is flat. Bowel sounds are normal.     Palpations: Abdomen is soft.  Musculoskeletal:        General: Normal range of motion.     Cervical back: Normal range of motion and neck supple.  Skin:    General: Skin is warm and dry.  Neurological:     Mental Status: He is alert. Mental status is at baseline.    Review of Systems  Constitutional: Negative.   HENT: Negative.    Eyes: Negative.   Respiratory: Negative.    Cardiovascular: Negative.   Gastrointestinal: Negative.   Genitourinary: Negative.   Musculoskeletal: Negative.   Skin: Negative.   Neurological: Negative.   Endo/Heme/Allergies: Negative.   Psychiatric/Behavioral:  Positive for depression and suicidal ideas. The patient is nervous/anxious.    Blood pressure 135/79, pulse 77, temperature 98.6 F (37 C), temperature source Oral, resp. rate 18, height 6\' 3"  (1.905 m), weight 103.9 kg, SpO2 99 %. Body mass index is 28.62 kg/m.   Treatment Plan Summary: Daily contact with patient to assess and evaluate symptoms and progress in treatment, Medication management, and Plan : Continue current meds and doses.   12/12/2021, 9:51 AM

## 2021-12-13 MED ORDER — QUETIAPINE FUMARATE 200 MG PO TABS
400.0000 mg | ORAL_TABLET | Freq: Every day | ORAL | Status: DC
  Administered 2021-12-13 – 2022-01-06 (×25): 400 mg via ORAL
  Filled 2021-12-13 (×24): qty 2

## 2021-12-13 NOTE — Progress Notes (Signed)
[facility]  12/13/2021 9:41 AM Timothy [name]  MRN:  [id number] Subjective:  PT endorses depression/anxiety but denies SI.  Pt is compliant with medications and receptive to medication teaching.  Pt's behavior is calm and cooperative; Tylenol given for pain in knee wound.  PT remained in bed most of evening but did get up to eat snack and take medications.  Pt rested most of night with no c/o distress.  Patient reports feeling somewhat depressed, though it is getting better. He is also reporting fleeting SI and AVH. He is compliant with his emds and is appropriate on the floor.  Principal Problem: Schizoaffective disorder, depressive type (HCC) Diagnosis: Principal Problem:   Schizoaffective disorder, depressive type (HCC) Active Problems:   Cannabis use disorder, moderate, dependence (HCC)   Homelessness  Total Time spent with patient: 20 minutes  Past Psychiatric History: schizoaffective disorder    Past Medical History:  Past Medical History:  Diagnosis Date   Cannabis abuse    Paranoid schizophrenia (HCC)    Schizoaffective disorder, depressive type (HCC) 09/17/2014   History reviewed. No pertinent surgical history. Family History:  Family History  Problem Relation Age of Onset   Mental illness Brother    Drug abuse Brother    Mental illness Cousin    Suicidality Cousin    Diabetes Maternal Grandmother    Family Psychiatric  History:  Social History:  Social History   Substance and Sexual Activity  Alcohol Use Yes   Alcohol/week: 2.0 standard drinks of alcohol   Types: 2 Cans of beer per week     Social History   Substance and Sexual Activity  Drug Use Yes   Types: Marijuana, MDMA Chiropodist(Ecstacy)    Social History   Socioeconomic History   Marital status: Single    Spouse name: Not on file   Number of children: Not on file   Years of education: Not on file   Highest education level: Not on file  Occupational History   Not on file  Tobacco Use    Smoking status: Some Days    Packs/day: 0.25    Types: Cigarettes   Smokeless tobacco: Never  Vaping Use   Vaping Use: Never used  Substance and Sexual Activity   Alcohol use: Yes    Alcohol/week: 2.0 standard drinks of alcohol    Types: 2 Cans of beer per week   Drug use: Yes    Types: Marijuana, MDMA (Ecstacy)   Sexual activity: Yes  Other Topics Concern   Not on file  Social History Narrative   Not on file   Social Determinants of Health   Financial Resource Strain: Low Risk  (12/31/2019)   Overall Financial Resource Strain (CARDIA)    Difficulty of Paying Living Expenses: Not hard at all  Food Insecurity: No Food Insecurity (12/31/2019)   Hunger Vital Sign    Worried About Running Out of Food in the Last Year: Never true    Ran Out of Food in the Last Year: Never true  Transportation Needs: No Transportation Needs (12/31/2019)   PRAPARE - Administrator, Civil ServiceTransportation    Lack of Transportation (Medical): No    Lack of Transportation (Non-Medical): No  Physical Activity: Inactive (12/31/2019)   Exercise Vital Sign    Days of Exercise per Week: 0 days    Minutes of Exercise per Session: 0 min  Stress: Stress Concern Present (12/31/2019)   [employer] of [employer]ational [employer] - Occupational Stress Questionnaire    Feeling of Stress :  Rather much  Social Connections: Moderately Isolated (12/31/2019)   Social Connection and Isolation Panel [NHANES]    Frequency of Communication with Friends and Family: Once a week    Frequency of Social Gatherings with Friends and Family: Once a week    Attends Religious Services: More than 4 times per year    Active Member of [employer] or Organizations: Yes    Attends Engineer, structural: More than 4 times per year    Marital Status: Never married   Additional Social History:                         Sleep: Good  Appetite:  Fair  Current Medications: Current Facility-Administered Medications  Medication Dose Route Frequency  Provider Last Rate Last Admin   acetaminophen (TYLENOL) tablet 650 mg  650 mg Oral Q6H PRN Clapacs, John T, MD   650 mg at 12/13/21 0817   alum & mag hydroxide-simeth (MAALOX/MYLANTA) 200-200-20 MG/5ML suspension 30 mL  30 mL Oral Q4H PRN Clapacs, John T, MD       buPROPion (WELLBUTRIN XL) 24 hr tablet 300 mg  300 mg Oral Daily Clapacs, John T, MD   300 mg at 12/13/21 5284   divalproex (DEPAKOTE) DR tablet 500 mg  500 mg Oral Q12H Clapacs, Jackquline Denmark, MD   500 mg at 12/13/21 1324   hydrOXYzine (ATARAX) tablet 50 mg  50 mg Oral TID PRN Clapacs, Jackquline Denmark, MD   50 mg at 12/13/21 0817   ibuprofen (ADVIL) tablet 600 mg  600 mg Oral Q6H PRN Clapacs, John T, MD   600 mg at 12/11/21 1118   LORazepam (ATIVAN) tablet 2 mg  2 mg Oral Q4H PRN Clapacs, John T, MD   2 mg at 12/12/21 1016   Or   LORazepam (ATIVAN) injection 2 mg  2 mg Intramuscular Q4H PRN Clapacs, John T, MD       magnesium hydroxide (MILK OF MAGNESIA) suspension 30 mL  30 mL Oral Daily PRN Clapacs, Jackquline Denmark, MD       neomycin-bacitracin-polymyxin (NEOSPORIN) ointment   Topical PRN Leo Rod   Given at 12/13/21 0813   nicotine (NICODERM CQ - dosed in mg/24 hours) patch 14 mg  14 mg Transdermal Daily Clapacs, Jackquline Denmark, MD   14 mg at 12/13/21 0834   pantoprazole (PROTONIX) EC tablet 40 mg  40 mg Oral Daily Clapacs, Jackquline Denmark, MD   40 mg at 12/13/21 0813   QUEtiapine (SEROQUEL) tablet 300 mg  300 mg Oral QHS Clapacs, John T, MD   300 mg at 12/12/21 2101   ziprasidone (GEODON) injection 20 mg  20 mg Intramuscular Q12H PRN Clapacs, Jackquline Denmark, MD   20 mg at 12/08/21 1909    Lab Results: No results found for this or any previous visit (from the past 48 hour(s)).  Blood Alcohol level:  Lab Results  Component Value Date   ETH <10 12/03/2021   ETH <10 10/31/2021    Metabolic Disorder Labs: Lab Results  Component Value Date   HGBA1C 5.5 11/04/2021   MPG 111.15 11/04/2021   MPG 114 06/17/2021   Lab Results  Component Value Date   PROLACTIN 33.2 (H)  08/15/2016   Lab Results  Component Value Date   CHOL 149 11/04/2021   TRIG 56 11/04/2021   HDL 35 (L) 11/04/2021   CHOLHDL 4.3 11/04/2021   VLDL 11 11/04/2021   LDLCALC 103 (H) 11/04/2021   LDLCALC  111 (H) 06/17/2021    Physical Findings: AIMS:  , ,  ,  ,    CIWA:    COWS:     Musculoskeletal: Strength & Muscle Tone: within normal limits Gait & Station: normal Patient leans: N/A  Psychiatric Specialty Exam:  Presentation  General Appearance: Casual; Neat  Eye Contact:Fair  Speech:Slow  Speech Volume:Decreased  Handedness:Right   Mood and Affect  Mood:Anxious; Depressed  Affect:Depressed   Thought Process  Thought Processes:Goal Directed  Descriptions of Associations:Circumstantial  Orientation:Full (Time, Place and Person)  Thought Content:Logical  History of Schizophrenia/Schizoaffective disorder:Yes  Duration of Psychotic Symptoms:Greater than six months  Hallucinations:Hallucinations: Auditory  Ideas of Reference:None  Suicidal Thoughts:Suicidal Thoughts: Yes, Passive SI Passive Intent and/or Plan: Without Intent  Homicidal Thoughts:Homicidal Thoughts: No   Sensorium  Memory:Immediate Fair; Remote Fair  Judgment:Fair  Insight:Fair   Executive Functions  Concentration:Fair  Attention Span:Fair  Recall:Fair  Fund of Knowledge:Fair  Language:Fair   Psychomotor Activity  Psychomotor Activity:Psychomotor Activity: Decreased   Assets  Assets:Communication Skills; Desire for Improvement; Financial Resources/Insurance; Social Support   Sleep  Sleep:Sleep: Fair    Physical Exam: Physical Exam Vitals and nursing note reviewed.  Constitutional:      Appearance: Normal appearance. He is normal weight.  HENT:     Head: Normocephalic and atraumatic.     Right Ear: External ear normal.     Left Ear: External ear normal.     Nose: Nose normal.     Mouth/Throat:     Mouth: Mucous membranes are moist.     Pharynx:  Oropharynx is clear.  Eyes:     Extraocular Movements: Extraocular movements intact.     Conjunctiva/sclera: Conjunctivae normal.     Pupils: Pupils are equal, round, and reactive to light.  Cardiovascular:     Rate and Rhythm: Normal rate and regular rhythm.     Pulses: Normal pulses.     Heart sounds: Normal heart sounds.  Pulmonary:     Effort: Pulmonary effort is normal.     Breath sounds: Normal breath sounds.  Abdominal:     General: Abdomen is flat. Bowel sounds are normal.     Palpations: Abdomen is soft.  Musculoskeletal:        General: Normal range of motion.     Cervical back: Normal range of motion and neck supple.  Skin:    General: Skin is warm and dry.  Neurological:     General: No focal deficit present.     Mental Status: He is alert and oriented to person, place, and time.    Review of Systems  Constitutional: Negative.   HENT: Negative.    Eyes: Negative.   Respiratory: Negative.  Negative for cough.   Cardiovascular: Negative.   Gastrointestinal: Negative.   Genitourinary: Negative.   Musculoskeletal: Negative.   Skin: Negative.   Neurological: Negative.   Endo/Heme/Allergies: Negative.   Psychiatric/Behavioral:  Positive for depression, hallucinations and suicidal ideas. The patient is nervous/anxious.    Blood pressure 118/75, pulse 82, temperature 98.6 F (37 C), temperature source Oral, resp. rate 18, height 6\' 3"  (1.905 m), weight 103.9 kg, SpO2 99 %. Body mass index is 28.62 kg/m.   Treatment Plan Summary: Daily contact with patient to assess and evaluate symptoms and progress in treatment, Medication management, and Plan : Increase Seroquel dose.   12/13/2021, 9:41 AM

## 2021-12-13 NOTE — Plan of Care (Signed)
PT endorses depression/anxiety but denies SI.  Pt is compliant with medications and receptive to medication teaching.  Pt's behavior is calm and cooperative; Tylenol given for pain in knee wound.  PT remained in bed most of evening but did get up to eat snack and take medications.  Pt rested most of night with no c/o distress.
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
## 2021-12-13 NOTE — [facility] Suicide Risk Assessment (Signed)
[facility] INPATIENT:  Family/Significant Other Suicide Prevention Education  Suicide Prevention Education:  Education Completed; Timothy [name], mother, [phone], has been identified by the patient as the family member/significant other with whom the patient will be residing, and identified as the person(s) who will aid the patient in the event of a mental health crisis (suicidal ideations/suicide attempt).  With written consent from the patient, the family member/significant other has been provided the following suicide prevention education, prior to the and/or following the discharge of the patient.  The suicide prevention education provided includes the following: Suicide risk factors Suicide prevention and interventions National Suicide Hotline telephone number [facility] assessment telephone number [facility] Emergency Assistance 911 [facility] and/or Residential Mobile Crisis Unit telephone number  Request made of family/significant other to: Remove weapons (e.g., guns, rifles, knives), all items previously/currently identified as safety concern.   Remove drugs/medications (over-the-counter, prescriptions, illicit drugs), all items previously/currently identified as a safety concern.  The family member/significant other verbalizes understanding of the suicide prevention education information provided.  The family member/significant other agrees to remove the items of safety concern listed above.  Carlos American reports that she is afraid of pt.  She was recently shown a video of pt acting "crazy" and has seen this type of behavior herself.  She feels like pt needs to be in a place long term "where he can be monitored."  Pt cannot stay with her or with his grandmother as they are both afraid of him.  Discussed that follow up treatment will be arranged but that long term treatment is very difficult to find currently.    Lorri Frederick 12/13/2021, 12:07 PM

## 2021-12-14 DIAGNOSIS — F251 Schizoaffective disorder, depressive type: Secondary | ICD-10-CM | POA: Diagnosis not present

## 2021-12-14 NOTE — Group Note (Signed)
[facility] LCSW Group Therapy Note    Group Date: 12/14/2021 Start Time: 1300 End Time: 1400  Type of Therapy and Topic:  Group Therapy:  Overcoming Obstacles  Participation Level:  [facility] PARTICIPATION LEVEL: Minimal  Mood:  Description of Group:   In this group patients will be encouraged to explore what they see as obstacles to their own wellness and recovery. They will be guided to discuss their thoughts, feelings, and behaviors related to these obstacles. The group will process together ways to cope with barriers, with attention given to specific choices patients can make. Each patient will be challenged to identify changes they are motivated to make in order to overcome their obstacles. This group will be process-oriented, with patients participating in exploration of their own experiences as well as giving and receiving support and challenge from other group members.  Therapeutic Goals: 1. Patient will identify personal and current obstacles as they relate to admission. 2. Patient will identify barriers that currently interfere with their wellness or overcoming obstacles.  3. Patient will identify feelings, thought process and behaviors related to these barriers. 4. Patient will identify two changes they are willing to make to overcome these obstacles:    Summary of Patient Progress Patient was present in group.  Patient was awake and attentive in group.  Patient was quiet and did not fully participate and appeared to be engaging in journaling.  He identifies his obstacle as being "the wrong crowd".   Therapeutic Modalities:   Cognitive Behavioral Therapy Solution Focused Therapy Motivational Interviewing Relapse Prevention Therapy   Harden Mo, LCSW

## 2021-12-14 NOTE — Plan of Care (Signed)
Patient pleasant and cooperative on approach. When staff asked about the weekend patient states " it was not that good.I could not control my anger." Explained to patient the expected behaviors in the unit. Patient receptive with that. Patient visible in the milieu. Appropriate with staff & peers. Verbalized passive SI at times. Denies HI and AVH. Appetite and energy level good. ADLs maintained. Support and encouragement given.

## 2021-12-14 NOTE — Progress Notes (Signed)
Recreation Therapy Notes   Date: 12/14/2021   Time: 10:30 am   Location: Craft room     Behavioral response: Appropriate   Intervention Topic: Self-care    Discussion/Intervention:  Group content today was focused on Self-Care. The group defined self-care and some positive ways they care for themselves. Individuals expressed ways and reasons why they neglected any self-care in the past. Patients described ways to improve self-care in the future. The group explained what could happen if they did not do any self-care activities at all. The group participated in the intervention "self-care assessment" where they had a chance to discover some of their weaknesses and strengths in self- care. Patient came up with a self-care plan to improve themselves in the future.  Clinical Observations/Feedback: Patient came to group and identified self-care as art, exercising, drawing, video game and journaling. Participant identified that being irritable,anxious, lonely and hopeless as what stops him from participating in self-care. Individual was social with peers and staff while participating in the intervention   Kelyn Ponciano LRT/CTRS              Kameryn Tisdel 12/14/2021 12:10 PM

## 2021-12-14 NOTE — Progress Notes (Signed)
Patient calm and pleasant tonight. Pt endorses passive SI, verbally contracts for safety. Pt denies HI/AVH. Pt doesn't appear to be responding to internal stimuli tonight. Pt compliant with medication administration per MD orders. Pt given education, support and encouragement to be active in his treatment plan. Pt being monitored Q 15 minutes for safety per unit protocol. Pt remains safe on the unit.

## 2021-12-14 NOTE — Progress Notes (Signed)
Patient calm and pleasant tonight. Pt endorses passive SI, verbally contracts for safety. Pt denies HI/AVH. Pt doesn't appear to be responding to internal stimuli tonight. Pt compliant with medication administration per MD orders. Pt given education, support and encouragement to be active in his treatment plan. Pt being monitored Q 15 minutes for safety per unit protocol. Pt remains safe on the unit.  

## 2021-12-14 NOTE — Progress Notes (Signed)
[facility] MD Progress Note  12/14/2021 1:54 PM Timothy [name]  MRN:  [id number] Subjective: Follow-up patient with schizoaffective disorder.  Very different outward presentation compared to his previous lengthy stay.  He is up out of bed most of the day.  Interacts with nursing students.  Smiles frequently.  In one-on-one interview he acknowledges feeling better but says he still gets some suicidal thoughts and hopelessness at times and a little bit of hallucinations but not nearly as bad as before.  No complaints about medicine.  Seems to be tolerating it fine. Principal Problem: Schizoaffective disorder, depressive type (HCC) Diagnosis: Principal Problem:   Schizoaffective disorder, depressive type (HCC) Active Problems:   Cannabis use disorder, moderate, dependence (HCC)   Homelessness  Total Time spent with patient: 30 minutes  Past Psychiatric History: Past history of schizoaffective disorder  Past Medical History:  Past Medical History:  Diagnosis Date   Cannabis abuse    Paranoid schizophrenia (HCC)    Schizoaffective disorder, depressive type (HCC) 09/17/2014   History reviewed. No pertinent surgical history. Family History:  Family History  Problem Relation Age of Onset   Mental illness Brother    Drug abuse Brother    Mental illness Cousin    Suicidality Cousin    Diabetes Maternal Grandmother    Family Psychiatric  History: See previous Social History:  Social History   Substance and Sexual Activity  Alcohol Use Yes   Alcohol/week: 2.0 standard drinks of alcohol   Types: 2 Cans of beer per week     Social History   Substance and Sexual Activity  Drug Use Yes   Types: Marijuana, MDMA Chiropodist)    Social History   Socioeconomic History   Marital status: Single    Spouse name: Not on file   Number of children: Not on file   Years of education: Not on file   Highest education level: Not on file  Occupational History   Not on file  Tobacco Use   Smoking status:  Some Days    Packs/day: 0.25    Types: Cigarettes   Smokeless tobacco: Never  Vaping Use   Vaping Use: Never used  Substance and Sexual Activity   Alcohol use: Yes    Alcohol/week: 2.0 standard drinks of alcohol    Types: 2 Cans of beer per week   Drug use: Yes    Types: Marijuana, MDMA (Ecstacy)   Sexual activity: Yes  Other Topics Concern   Not on file  Social History Narrative   Not on file   Social Determinants of Health   Financial Resource Strain: Low Risk  (12/31/2019)   Overall Financial Resource Strain (CARDIA)    Difficulty of Paying Living Expenses: Not hard at all  Food Insecurity: No Food Insecurity (12/31/2019)   Hunger Vital Sign    Worried About Running Out of Food in the Last Year: Never true    Ran Out of Food in the Last Year: Never true  Transportation Needs: No Transportation Needs (12/31/2019)   PRAPARE - Administrator, Civil Service (Medical): No    Lack of Transportation (Non-Medical): No  Physical Activity: Inactive (12/31/2019)   Exercise Vital Sign    Days of Exercise per Week: 0 days    Minutes of Exercise per Session: 0 min  Stress: Stress Concern Present (12/31/2019)   [employer] of Occupational Health - Occupational Stress Questionnaire    Feeling of Stress : Rather much  Social Connections: Moderately Isolated (12/31/2019)  Social Advertising account executive [NHANES]    Frequency of Communication with Friends and Family: Once a week    Frequency of Social Gatherings with Friends and Family: Once a week    Attends Religious Services: More than 4 times per year    Active Member of [employer] or Organizations: Yes    Attends Engineer, structural: More than 4 times per year    Marital Status: Never married   Additional Social History:                         Sleep: Fair  Appetite:  Fair  Current Medications: Current Facility-Administered Medications  Medication Dose Route Frequency Provider Last Rate  Last Admin   acetaminophen (TYLENOL) tablet 650 mg  650 mg Oral Q6H PRN Naoma Boxell T, MD   650 mg at 12/13/21 2112   alum & mag hydroxide-simeth (MAALOX/MYLANTA) 200-200-20 MG/5ML suspension 30 mL  30 mL Oral Q4H PRN Delio Slates T, MD       buPROPion (WELLBUTRIN XL) 24 hr tablet 300 mg  300 mg Oral Daily Mehar Kirkwood T, MD   300 mg at 12/14/21 0806   divalproex (DEPAKOTE) DR tablet 500 mg  500 mg Oral Q12H Tavarious Freel, Jackquline Denmark, MD   500 mg at 12/14/21 4332   hydrOXYzine (ATARAX) tablet 50 mg  50 mg Oral TID PRN Starling Christofferson, Jackquline Denmark, MD   50 mg at 12/13/21 0817   ibuprofen (ADVIL) tablet 600 mg  600 mg Oral Q6H PRN Deven Furia T, MD   600 mg at 12/11/21 1118   LORazepam (ATIVAN) tablet 2 mg  2 mg Oral Q4H PRN Cherlyn Syring T, MD   2 mg at 12/13/21 1304   Or   LORazepam (ATIVAN) injection 2 mg  2 mg Intramuscular Q4H PRN Zarayah Lanting T, MD       magnesium hydroxide (MILK OF MAGNESIA) suspension 30 mL  30 mL Oral Daily PRN Ashawnti Tangen, Jackquline Denmark, MD       neomycin-bacitracin-polymyxin (NEOSPORIN) ointment   Topical PRN Leo Rod   Given at 12/13/21 0813   nicotine (NICODERM CQ - dosed in mg/24 hours) patch 14 mg  14 mg Transdermal Daily Alexzandra Bilton, Jackquline Denmark, MD   14 mg at 12/14/21 0812   pantoprazole (PROTONIX) EC tablet 40 mg  40 mg Oral Daily Kelita Wallis T, MD   40 mg at 12/14/21 0806   QUEtiapine (SEROQUEL) tablet 400 mg  400 mg Oral QHS Malik, Kashif   400 mg at 12/13/21 2112   ziprasidone (GEODON) injection 20 mg  20 mg Intramuscular Q12H PRN Satrina Magallanes, Jackquline Denmark, MD   20 mg at 12/13/21 1336    Lab Results: No results found for this or any previous visit (from the past 48 hour(s)).  Blood Alcohol level:  Lab Results  Component Value Date   ETH <10 12/03/2021   ETH <10 10/31/2021    Metabolic Disorder Labs: Lab Results  Component Value Date   HGBA1C 5.5 11/04/2021   MPG 111.15 11/04/2021   MPG 114 06/17/2021   Lab Results  Component Value Date   PROLACTIN 33.2 (H) 08/15/2016   Lab Results   Component Value Date   CHOL 149 11/04/2021   TRIG 56 11/04/2021   HDL 35 (L) 11/04/2021   CHOLHDL 4.3 11/04/2021   VLDL 11 11/04/2021   LDLCALC 103 (H) 11/04/2021   LDLCALC 111 (H) 06/17/2021    Physical Findings: AIMS:  , ,  ,  ,  CIWA:    COWS:     Musculoskeletal: Strength & Muscle Tone: within normal limits Gait & Station: normal Patient leans: N/A  Psychiatric Specialty Exam:  Presentation  General Appearance: Appropriate for Environment; Casual  Eye Contact:Fair  Speech:Normal Rate  Speech Volume:Decreased  Handedness:Right   Mood and Affect  Mood:Anxious; Depressed  Affect:Constricted   Thought Process  Thought Processes:Coherent  Descriptions of Associations:Intact  Orientation:Full (Time, Place and Person)  Thought Content:Logical  History of Schizophrenia/Schizoaffective disorder:Yes  Duration of Psychotic Symptoms:Greater than six months  Hallucinations:Hallucinations: Auditory; Visual  Ideas of Reference:None  Suicidal Thoughts:Suicidal Thoughts: Yes, Passive SI Passive Intent and/or Plan: Without Intent  Homicidal Thoughts:Homicidal Thoughts: No   Sensorium  Memory:Immediate Fair; Remote Good  Judgment:Fair  Insight:Fair   Executive Functions  Concentration:Fair  Attention Span:Fair  Recall:Fair  Fund of Knowledge:Fair  Language:Fair   Psychomotor Activity  Psychomotor Activity:Psychomotor Activity: Normal   Assets  Assets:Communication Skills; Desire for Improvement; Housing   Sleep  Sleep:Sleep: Fair    Physical Exam: Physical Exam Vitals and nursing note reviewed.  Constitutional:      Appearance: Normal appearance.  HENT:     Head: Normocephalic and atraumatic.     Mouth/Throat:     Pharynx: Oropharynx is clear.  Eyes:     Pupils: Pupils are equal, round, and reactive to light.  Cardiovascular:     Rate and Rhythm: Normal rate and regular rhythm.  Pulmonary:     Effort: Pulmonary effort  is normal.     Breath sounds: Normal breath sounds.  Abdominal:     General: Abdomen is flat.     Palpations: Abdomen is soft.  Musculoskeletal:        General: Normal range of motion.  Skin:    General: Skin is warm and dry.  Neurological:     General: No focal deficit present.     Mental Status: He is alert. Mental status is at baseline.  Psychiatric:        Attention and Perception: Attention normal.        Mood and Affect: Mood normal.        Speech: Speech normal.        Behavior: Behavior is cooperative.        Thought Content: Thought content normal.        Cognition and Memory: Cognition normal.    Review of Systems  Constitutional: Negative.   HENT: Negative.    Eyes: Negative.   Respiratory: Negative.    Cardiovascular: Negative.   Gastrointestinal: Negative.   Musculoskeletal: Negative.   Skin: Negative.   Neurological: Negative.   Psychiatric/Behavioral:  Positive for depression and suicidal ideas. Negative for hallucinations and substance abuse. The patient is nervous/anxious.    Blood pressure 120/74, pulse 93, temperature 98.9 F (37.2 C), temperature source Oral, resp. rate 18, height 6\' 3"  (1.905 m), weight 103.9 kg, SpO2 99 %. Body mass index is 28.62 kg/m.   Treatment Plan Summary: Medication management and Plan seems to be doing well.  It may be that the Depakote really is a crucial medicine for him.  Probably getting closer to his baseline but we still have the problem of him being homeless with very little in the way of any options.  Continue to encourage attendance at groups and social interaction.  Timothy RasmussenJohn Rebie Peale, MD 12/14/2021, 1:54 PM

## 2021-12-14 NOTE — BH IP Treatment Plan (Signed)
Interdisciplinary Treatment and Diagnostic Plan Update  12/14/2021 Time of Session: 8:30 AM Timothy [name] MRN: [id number]  Principal Diagnosis: Schizoaffective disorder, depressive type (HCC)  Secondary Diagnoses: Principal Problem:   Schizoaffective disorder, depressive type (HCC) Active Problems:   Cannabis use disorder, moderate, dependence (HCC)   Homelessness   Current Medications:  Current Facility-Administered Medications  Medication Dose Route Frequency Provider Last Rate Last Admin   acetaminophen (TYLENOL) tablet 650 mg  650 mg Oral Q6H PRN Clapacs, John T, MD   650 mg at 12/13/21 2112   alum & mag hydroxide-simeth (MAALOX/MYLANTA) 200-200-20 MG/5ML suspension 30 mL  30 mL Oral Q4H PRN Clapacs, John T, MD       buPROPion (WELLBUTRIN XL) 24 hr tablet 300 mg  300 mg Oral Daily Clapacs, John T, MD   300 mg at 12/14/21 0806   divalproex (DEPAKOTE) DR tablet 500 mg  500 mg Oral Q12H Clapacs, John T, MD   500 mg at 12/14/21 9147   hydrOXYzine (ATARAX) tablet 50 mg  50 mg Oral TID PRN Clapacs, Jackquline Denmark, MD   50 mg at 12/13/21 0817   ibuprofen (ADVIL) tablet 600 mg  600 mg Oral Q6H PRN Clapacs, John T, MD   600 mg at 12/11/21 1118   LORazepam (ATIVAN) tablet 2 mg  2 mg Oral Q4H PRN Clapacs, John T, MD   2 mg at 12/13/21 1304   Or   LORazepam (ATIVAN) injection 2 mg  2 mg Intramuscular Q4H PRN Clapacs, John T, MD       magnesium hydroxide (MILK OF MAGNESIA) suspension 30 mL  30 mL Oral Daily PRN Clapacs, Jackquline Denmark, MD       neomycin-bacitracin-polymyxin (NEOSPORIN) ointment   Topical PRN Leo Rod   Given at 12/13/21 0813   nicotine (NICODERM CQ - dosed in mg/24 hours) patch 14 mg  14 mg Transdermal Daily Clapacs, Jackquline Denmark, MD   14 mg at 12/14/21 0812   pantoprazole (PROTONIX) EC tablet 40 mg  40 mg Oral Daily Clapacs, John T, MD   40 mg at 12/14/21 0806   QUEtiapine (SEROQUEL) tablet 400 mg  400 mg Oral QHS Malik, Kashif   400 mg at 12/13/21 2112   ziprasidone (GEODON) injection 20 mg   20 mg Intramuscular Q12H PRN Clapacs, John T, MD   20 mg at 12/13/21 1336   PTA Medications: Medications Prior to Admission  Medication Sig Dispense Refill Last Dose   buPROPion (WELLBUTRIN XL) 300 MG 24 hr tablet Take 1 tablet (300 mg total) by mouth daily. 30 tablet 1    FLUoxetine (PROZAC) 20 MG capsule Take 1 capsule (20 mg total) by mouth daily. 30 capsule 1    QUEtiapine (SEROQUEL) 300 MG tablet Take 1 tablet (300 mg total) by mouth at bedtime. 30 tablet 1     Patient Stressors: Financial difficulties   Marital or family conflict   Medication change or noncompliance    Patient Strengths: Motivation for treatment/growth   Treatment Modalities: Medication Management, Group therapy, Case management,  1 to 1 session with clinician, Psychoeducation, Recreational therapy.   Physician Treatment Plan for Primary Diagnosis: Schizoaffective disorder, depressive type (HCC) Long Term Goal(s): Improvement in symptoms so as ready for discharge   Short Term Goals: Compliance with prescribed medications will improve Ability to verbalize feelings will improve Ability to disclose and discuss suicidal ideas Ability to demonstrate self-control will improve  Medication Management: Evaluate patient's response, side effects, and tolerance of medication regimen.  Therapeutic  Interventions: 1 to 1 sessions, Unit Group sessions and Medication administration.  Evaluation of Outcomes: Not Progressing  Physician Treatment Plan for Secondary Diagnosis: Principal Problem:   Schizoaffective disorder, depressive type (HCC) Active Problems:   Cannabis use disorder, moderate, dependence (HCC)   Homelessness  Long Term Goal(s): Improvement in symptoms so as ready for discharge   Short Term Goals: Compliance with prescribed medications will improve Ability to verbalize feelings will improve Ability to disclose and discuss suicidal ideas Ability to demonstrate self-control will improve     Medication  Management: Evaluate patient's response, side effects, and tolerance of medication regimen.  Therapeutic Interventions: 1 to 1 sessions, Unit Group sessions and Medication administration.  Evaluation of Outcomes: Not Progressing   RN Treatment Plan for Primary Diagnosis: Schizoaffective disorder, depressive type (HCC) Long Term Goal(s): Knowledge of disease and therapeutic regimen to maintain health will improve  Short Term Goals: Ability to remain free from injury will improve, Ability to verbalize frustration and anger appropriately will improve, Ability to demonstrate self-control, Ability to participate in decision making will improve, Ability to verbalize feelings will improve, Ability to disclose and discuss suicidal ideas, Ability to identify and develop effective coping behaviors will improve, and Compliance with prescribed medications will improve  Medication Management: RN will administer medications as ordered by provider, will assess and evaluate patient's response and provide education to patient for prescribed medication. RN will report any adverse and/or side effects to prescribing provider.  Therapeutic Interventions: 1 on 1 counseling sessions, Psychoeducation, Medication administration, Evaluate responses to treatment, Monitor vital signs and CBGs as ordered, Perform/monitor CIWA, COWS, AIMS and Fall Risk screenings as ordered, Perform wound care treatments as ordered.  Evaluation of Outcomes: Not Progressing   LCSW Treatment Plan for Primary Diagnosis: Schizoaffective disorder, depressive type (HCC) Long Term Goal(s): Safe transition to appropriate next level of care at discharge, Engage patient in therapeutic group addressing interpersonal concerns.  Short Term Goals: Engage patient in aftercare planning with referrals and resources, Increase social support, Increase ability to appropriately verbalize feelings, Increase emotional regulation, Facilitate acceptance of mental  health diagnosis and concerns, and Increase skills for wellness and recovery  Therapeutic Interventions: Assess for all discharge needs, 1 to 1 time with Social worker, Explore available resources and support systems, Assess for adequacy in community support network, Educate family and significant other(s) on suicide prevention, Complete Psychosocial Assessment, Interpersonal group therapy.  Evaluation of Outcomes: Not Progressing   Progress in Treatment: Attending groups: No. Participating in groups: No. Taking medication as prescribed: Yes. Toleration medication: Yes. Family/Significant other contact made: Yes, individual(s) contacted:  mother, Scarlette SliceGerica Ramsey. Patient understands diagnosis: Yes. Discussing patient identified problems/goals with staff: Yes. Medical problems stabilized or resolved: Yes. Denies suicidal/homicidal ideation: No. Issues/concerns per patient self-inventory: No. Other: none.  New problem(s) identified: No, Describe:  none identified.  New Short Term/Long Term Goal(s): elimination of symptoms of psychosis, medication management for mood stabilization; elimination of SI thoughts; development of comprehensive mental wellness/sobriety plan. Update 12/14/21: No changes at this time.    Patient Goals:  "I'm just here to get myself together" Update 12/14/21: No changes at this time.    Discharge Plan or Barriers: CSW will continue to meet with the patient and development appropriate discharge plans for the patient. Update 12/14/21: No changes at this time.    Reason for Continuation of Hospitalization: Aggression Anxiety Depression Hallucinations Medical Issues Medication stabilization Suicidal ideation   Estimated Length of Stay:  TBD  Last 3 [place] Suicide Severity  Risk Score: Flowsheet Row Admission (Current) from [facility]07/[facility] ED from 12/03/2021 in [facility] Admission (Discharged) from  11/01/2021 in [facility]ICINE  C-SSRS RISK CATEGORY High Risk No Risk No Risk       Last PHQ 2/9 Scores:    12/31/2019   10:13 AM  Depression screen PHQ 2/9  Decreased Interest 2  Down, Depressed, Hopeless 1  PHQ - 2 Score 3  Altered sleeping 3  Tired, decreased energy 3  Trouble concentrating 1  Moving slowly or fidgety/restless 2  Suicidal thoughts 2  PHQ-9 Score 14  Difficult doing work/chores Very difficult    Scribe for Treatment Team: [name], LCSW 12/14/2021 8:51 AM

## 2021-12-15 DIAGNOSIS — F251 Schizoaffective disorder, depressive type: Secondary | ICD-10-CM | POA: Diagnosis not present

## 2021-12-15 NOTE — Progress Notes (Signed)
[facility]  12/15/2021 5:17 PM Timothy [name]  MRN:  [id number] Subjective: Patient seen and chart reviewed.  Patient reports his mood is still feeling depressed.  Also describes himself as anxious.  Talked a little bit explicitly about how upset he is that he cannot stay with his mother and that he is homeless.  Denies current hallucinations.  Passive suicidal thoughts without plan Principal Problem: Schizoaffective disorder, depressive type (HCC) Diagnosis: Principal Problem:   Schizoaffective disorder, depressive type (HCC) Active Problems:   Cannabis use disorder, moderate, dependence (HCC)   Homelessness  Total Time spent with patient: 30 minutes  Past Psychiatric History: History of longstanding schizoaffective disorder  Past Medical History:  Past Medical History:  Diagnosis Date   Cannabis abuse    Paranoid schizophrenia (HCC)    Schizoaffective disorder, depressive type (HCC) 09/17/2014   History reviewed. No pertinent surgical history. Family History:  Family History  Problem Relation Age of Onset   Mental illness Brother    Drug abuse Brother    Mental illness Cousin    Suicidality Cousin    Diabetes Maternal Grandmother    Family Psychiatric  History: See previous Social History:  Social History   Substance and Sexual Activity  Alcohol Use Yes   Alcohol/week: 2.0 standard drinks of alcohol   Types: 2 Cans of beer per week     Social History   Substance and Sexual Activity  Drug Use Yes   Types: Marijuana, MDMA Chiropodist(Ecstacy)    Social History   Socioeconomic History   Marital status: Single    Spouse name: Not on file   Number of children: Not on file   Years of education: Not on file   Highest education level: Not on file  Occupational History   Not on file  Tobacco Use   Smoking status: Some Days    Packs/day: 0.25    Types: Cigarettes   Smokeless tobacco: Never  Vaping Use   Vaping Use: Never used  Substance and Sexual Activity    Alcohol use: Yes    Alcohol/week: 2.0 standard drinks of alcohol    Types: 2 Cans of beer per week   Drug use: Yes    Types: Marijuana, MDMA (Ecstacy)   Sexual activity: Yes  Other Topics Concern   Not on file  Social History Narrative   Not on file   Social Determinants of Health   Financial Resource Strain: Low Risk  (12/31/2019)   Overall Financial Resource Strain (CARDIA)    Difficulty of Paying Living Expenses: Not hard at all  Food Insecurity: No Food Insecurity (12/31/2019)   Hunger Vital Sign    Worried About Running Out of Food in the Last Year: Never true    Ran Out of Food in the Last Year: Never true  Transportation Needs: No Transportation Needs (12/31/2019)   PRAPARE - Administrator, Civil ServiceTransportation    Lack of Transportation (Medical): No    Lack of Transportation (Non-Medical): No  Physical Activity: Inactive (12/31/2019)   Exercise Vital Sign    Days of Exercise per Week: 0 days    Minutes of Exercise per Session: 0 min  Stress: Stress Concern Present (12/31/2019)   [employer] - Occupational Stress Questionnaire    Feeling of Stress : Rather much  Social Connections: Moderately Isolated (12/31/2019)   Social Connection and Isolation Panel [NHANES]    Frequency of Communication with Friends and Family: Once a week    Frequency of Social Gatherings  with Friends and Family: Once a week    Attends Religious Services: More than 4 times per year    Active Member of Clubs or Organizations: Yes    Attends Engineer, structural: More than 4 times per year    Marital Status: Never married   Additional Social History:                         Sleep: Fair  Appetite:  Fair  Current Medications: Current Facility-Administered Medications  Medication Dose Route Frequency Provider Last Rate Last Admin   acetaminophen (TYLENOL) tablet 650 mg  650 mg Oral Q6H PRN Rishik Tubby T, MD   650 mg at 12/14/21 1803   alum & mag hydroxide-simeth  (MAALOX/MYLANTA) 200-200-20 MG/5ML suspension 30 mL  30 mL Oral Q4H PRN Tona Qualley T, MD       buPROPion (WELLBUTRIN XL) 24 hr tablet 300 mg  300 mg Oral Daily Holleigh Crihfield T, MD   300 mg at 12/15/21 0801   divalproex (DEPAKOTE) DR tablet 500 mg  500 mg Oral Q12H Mance Vallejo T, MD   500 mg at 12/15/21 0802   hydrOXYzine (ATARAX) tablet 50 mg  50 mg Oral TID PRN Lyana Asbill, Jackquline Denmark, MD   50 mg at 12/13/21 0817   ibuprofen (ADVIL) tablet 600 mg  600 mg Oral Q6H PRN Salina Stanfield T, MD   600 mg at 12/11/21 1118   LORazepam (ATIVAN) tablet 2 mg  2 mg Oral Q4H PRN Jahnessa Vanduyn T, MD   2 mg at 12/13/21 1304   Or   LORazepam (ATIVAN) injection 2 mg  2 mg Intramuscular Q4H PRN Mitzy Naron T, MD       magnesium hydroxide (MILK OF MAGNESIA) suspension 30 mL  30 mL Oral Daily PRN Jenna Ardoin, Jackquline Denmark, MD       neomycin-bacitracin-polymyxin (NEOSPORIN) ointment   Topical PRN Leo Rod   Given at 12/13/21 0813   nicotine (NICODERM CQ - dosed in mg/24 hours) patch 14 mg  14 mg Transdermal Daily Kelsea Mousel, Jackquline Denmark, MD   14 mg at 12/15/21 0803   pantoprazole (PROTONIX) EC tablet 40 mg  40 mg Oral Daily Diron Haddon T, MD   40 mg at 12/15/21 0801   QUEtiapine (SEROQUEL) tablet 400 mg  400 mg Oral QHS Malik, Kashif   400 mg at 12/14/21 2108   ziprasidone (GEODON) injection 20 mg  20 mg Intramuscular Q12H PRN Nickey Canedo, Jackquline Denmark, MD   20 mg at 12/13/21 1336    Lab Results: No results found for this or any previous visit (from the past 48 hour(s)).  Blood Alcohol level:  Lab Results  Component Value Date   ETH <10 12/03/2021   ETH <10 10/31/2021    Metabolic Disorder Labs: Lab Results  Component Value Date   HGBA1C 5.5 11/04/2021   MPG 111.15 11/04/2021   MPG 114 06/17/2021   Lab Results  Component Value Date   PROLACTIN 33.2 (H) 08/15/2016   Lab Results  Component Value Date   CHOL 149 11/04/2021   TRIG 56 11/04/2021   HDL 35 (L) 11/04/2021   CHOLHDL 4.3 11/04/2021   VLDL 11 11/04/2021    LDLCALC 103 (H) 11/04/2021   LDLCALC 111 (H) 06/17/2021    Physical Findings: AIMS:  , ,  ,  ,    CIWA:    COWS:     Musculoskeletal: Strength & Muscle Tone: within normal limits Gait &  Station: normal Patient leans: N/A  Psychiatric Specialty Exam:  Presentation  General Appearance: Appropriate for Environment; Casual  Eye Contact:Fair  Speech:Normal Rate  Speech Volume:Decreased  Handedness:Right   Mood and Affect  Mood:Anxious; Depressed  Affect:Constricted   Thought Process  Thought Processes:Coherent  Descriptions of Associations:Intact  Orientation:Full (Time, Place and Person)  Thought Content:Logical  History of Schizophrenia/Schizoaffective disorder:Yes  Duration of Psychotic Symptoms:Greater than six months  Hallucinations:No data recorded Ideas of Reference:None  Suicidal Thoughts:No data recorded Homicidal Thoughts:No data recorded  Sensorium  Memory:Immediate Fair; Remote Good  Judgment:Fair  Insight:Fair   Executive Functions  Concentration:Fair  Attention Span:Fair  Recall:Fair  Fund of Knowledge:Fair  Language:Fair   Psychomotor Activity  Psychomotor Activity:No data recorded  Assets  Assets:Communication Skills; Desire for Improvement; Housing   Sleep  Sleep:No data recorded   Physical Exam: Physical Exam Vitals and nursing note reviewed.  Constitutional:      Appearance: Normal appearance.  HENT:     Head: Normocephalic and atraumatic.     Mouth/Throat:     Pharynx: Oropharynx is clear.  Eyes:     Pupils: Pupils are equal, round, and reactive to light.  Cardiovascular:     Rate and Rhythm: Normal rate and regular rhythm.  Pulmonary:     Effort: Pulmonary effort is normal.     Breath sounds: Normal breath sounds.  Abdominal:     General: Abdomen is flat.     Palpations: Abdomen is soft.  Musculoskeletal:        General: Normal range of motion.  Skin:    General: Skin is warm and dry.   Neurological:     General: No focal deficit present.     Mental Status: He is alert. Mental status is at baseline.  Psychiatric:        Attention and Perception: Attention normal.        Mood and Affect: Mood is depressed.        Speech: Speech is delayed.        Behavior: Behavior is slowed.        Thought Content: Thought content includes suicidal ideation. Thought content does not include suicidal plan.        Cognition and Memory: Cognition normal.    Review of Systems  Constitutional: Negative.   HENT: Negative.    Eyes: Negative.   Respiratory: Negative.    Cardiovascular: Negative.   Gastrointestinal: Negative.   Musculoskeletal: Negative.   Skin: Negative.   Neurological: Negative.   Psychiatric/Behavioral:  Positive for depression. The patient is nervous/anxious and has insomnia.    Blood pressure 126/71, pulse 88, temperature 98.4 F (36.9 C), temperature source Oral, resp. rate 18, height 6\' 3"  (1.905 m), weight 103.9 kg, SpO2 100 %. Body mass index is 28.62 kg/m.   Treatment Plan Summary: Medication management and Plan no change to medication.  Supportive counseling and therapy.  Urged patient to continue working with treatment team on discharge options  , MD 12/15/2021, 5:17 PM

## 2021-12-15 NOTE — Progress Notes (Signed)
Recreation Therapy Notes  Date: 12/15/2021   Time: 10:00 am   Location: Court yard     Behavioral response: N/A   Intervention Topic: Honesty    Discussion/Intervention: Patient refused to attend group.    Clinical Observations/Feedback:  Patient refused to attend group.    Chanci Ojala LRT/CTRS        Sadeel Fiddler 12/15/2021 10:13 AM        Karlis Cregg 12/15/2021 10:13 AM

## 2021-12-15 NOTE — Progress Notes (Signed)
Patient calm and pleasant tonight. Pt endorses passive SI, verbally contracts for safety. Pt denies HI/AVH. Pt doesn't appear to be responding to internal stimuli tonight. Pt compliant with medication administration per MD orders. Pt given education, support and encouragement to be active in his treatment plan. Pt being monitored Q 15 minutes for safety per unit protocol. Pt remains safe on the unit. Pt endorsed anxiety and depression tonight. PRN medication given, check MAR

## 2021-12-15 NOTE — Plan of Care (Signed)
Problem: Activity: Goal: Will verbalize the importance of balancing activity with adequate rest periods 12/15/2021 1029 by Ardelle AntonWalker-Davis, Cassi Jenne M, RN Outcome: Progressing 12/15/2021 1029 by Ardelle AntonWalker-Davis, Trynity Skousen M, RN Outcome: Progressing   Problem: Education: Goal: Will be free of psychotic symptoms 12/15/2021 1029 by Ardelle AntonWalker-Davis, Castiel Lauricella M, RN Outcome: Progressing 12/15/2021 1029 by Ardelle AntonWalker-Davis, Wing Gfeller M, RN Outcome: Progressing Goal: Knowledge of the prescribed therapeutic regimen will improve 12/15/2021 1029 by Ardelle AntonWalker-Davis, Osmara Drummonds M, RN Outcome: Progressing 12/15/2021 1029 by Ardelle AntonWalker-Davis, Evonne Rinks M, RN Outcome: Progressing   Problem: Coping: Goal: Coping ability will improve 12/15/2021 1029 by Ardelle AntonWalker-Davis, Lashaun Krapf M, RN Outcome: Progressing 12/15/2021 1029 by Ardelle AntonWalker-Davis, Deshea Pooley M, RN Outcome: Progressing Goal: Will verbalize feelings 12/15/2021 1029 by Ardelle AntonWalker-Davis, Roselani Grajeda M, RN Outcome: Progressing 12/15/2021 1029 by Ardelle AntonWalker-Davis, Danile Trier M, RN Outcome: Progressing   Problem: Health Behavior/Discharge Planning: Goal: Compliance with prescribed medication regimen will improve 12/15/2021 1029 by Ardelle AntonWalker-Davis, Anavi Branscum M, RN Outcome: Progressing 12/15/2021 1029 by Ardelle AntonWalker-Davis, Kloi Brodman M, RN Outcome: Progressing   Problem: Nutritional: Goal: Ability to achieve adequate nutritional intake will improve 12/15/2021 1029 by Ardelle AntonWalker-Davis, Caiden Arteaga M, RN Outcome: Progressing 12/15/2021 1029 by Ardelle AntonWalker-Davis, Baillie Mohammad M, RN Outcome: Progressing   Problem: Role Relationship: Goal: Ability to communicate needs accurately will improve 12/15/2021 1029 by Ardelle AntonWalker-Davis, Shaniece Bussa M, RN Outcome: Progressing 12/15/2021 1029 by Ardelle AntonWalker-Davis, Torrin Frein M, RN Outcome: Progressing Goal: Ability to interact with others will improve 12/15/2021 1029 by Ardelle AntonWalker-Davis, Kaziyah Parkison M, RN Outcome: Progressing 12/15/2021 1029 by Ardelle AntonWalker-Davis, Royal Beirne M, RN Outcome: Progressing   Problem: Safety: Goal:  Ability to redirect hostility and anger into socially appropriate behaviors will improve 12/15/2021 1029 by Ardelle AntonWalker-Davis, Amarea Macdowell M, RN Outcome: Progressing 12/15/2021 1029 by Ardelle AntonWalker-Davis, Trestan Vahle M, RN Outcome: Progressing Goal: Ability to remain free from injury will improve 12/15/2021 1029 by Ardelle AntonWalker-Davis, Lang Zingg M, RN Outcome: Progressing 12/15/2021 1029 by Ardelle AntonWalker-Davis, Shannyn Jankowiak M, RN Outcome: Progressing   Problem: Self-Care: Goal: Ability to participate in self-care as condition permits will improve 12/15/2021 1029 by Ardelle AntonWalker-Davis, Elvera Almario M, RN Outcome: Progressing 12/15/2021 1029 by Ardelle AntonWalker-Davis, Radonna Bracher M, RN Outcome: Progressing   Problem: Self-Concept: Goal: Will verbalize positive feelings about self 12/15/2021 1029 by Ardelle AntonWalker-Davis, Candela Krul M, RN Outcome: Progressing 12/15/2021 1029 by Ardelle AntonWalker-Davis, Kasie Leccese M, RN Outcome: Progressing   Problem: Education: Goal: Utilization of techniques to improve thought processes will improve 12/15/2021 1029 by Ardelle AntonWalker-Davis, Sabeen Piechocki M, RN Outcome: Progressing 12/15/2021 1029 by Ardelle AntonWalker-Davis, Demetris Meinhardt M, RN Outcome: Progressing Goal: Knowledge of the prescribed therapeutic regimen will improve 12/15/2021 1029 by Ardelle AntonWalker-Davis, Nioma Mccubbins M, RN Outcome: Progressing 12/15/2021 1029 by Ardelle AntonWalker-Davis, Charly Holcomb M, RN Outcome: Progressing   Problem: Activity: Goal: Interest or engagement in leisure activities will improve 12/15/2021 1029 by Ardelle AntonWalker-Davis, Trystyn Dolley M, RN Outcome: Progressing 12/15/2021 1029 by Ardelle AntonWalker-Davis, Yareliz Thorstenson M, RN Outcome: Progressing Goal: Imbalance in normal sleep/wake cycle will improve 12/15/2021 1029 by Ardelle AntonWalker-Davis, Ameirah Khatoon M, RN Outcome: Progressing 12/15/2021 1029 by Ardelle AntonWalker-Davis, Eulogio Requena M, RN Outcome: Progressing   Problem: Coping: Goal: Coping ability will improve 12/15/2021 1029 by Ardelle AntonWalker-Davis, Clester Chlebowski M, RN Outcome: Progressing 12/15/2021 1029 by Ardelle AntonWalker-Davis, Coda Filler M, RN Outcome: Progressing Goal: Will  verbalize feelings 12/15/2021 1029 by Ardelle AntonWalker-Davis, Tamikka Pilger M, RN Outcome: Progressing 12/15/2021 1029 by Ardelle AntonWalker-Davis, Carmencita Cusic M, RN Outcome: Progressing   Problem: Health Behavior/Discharge Planning: Goal: Ability to make decisions will improve 12/15/2021 1029 by Ardelle AntonWalker-Davis, Dyllon Henken M, RN Outcome: Progressing 12/15/2021 1029 by Ardelle AntonWalker-Davis, Christorpher Hisaw M, RN Outcome: Progressing Goal: Compliance with therapeutic regimen will improve 12/15/2021 1029 by Ardelle AntonWalker-Davis, Yahaira Bruski M, RN Outcome: Progressing 12/15/2021 1029 by Lindwood CokeWalker-Davis, Bathsheba Durrett  M, RN Outcome: Progressing   Problem: Role Relationship: Goal: Will demonstrate positive changes in social behaviors and relationships 12/15/2021 1029 by Ardelle Anton, RN Outcome: Progressing 12/15/2021 1029 by Ardelle Anton, RN Outcome: Progressing   Problem: Safety: Goal: Ability to disclose and discuss suicidal ideas will improve 12/15/2021 1029 by Ardelle Anton, RN Outcome: Progressing 12/15/2021 1029 by Ardelle Anton, RN Outcome: Progressing Goal: Ability to identify and utilize support systems that promote safety will improve 12/15/2021 1029 by Ardelle Anton, RN Outcome: Progressing 12/15/2021 1029 by Ardelle Anton, RN Outcome: Progressing   Problem: Self-Concept: Goal: Will verbalize positive feelings about self 12/15/2021 1029 by Ardelle Anton, RN Outcome: Progressing 12/15/2021 1029 by Ardelle Anton, RN Outcome: Progressing Goal: Level of anxiety will decrease 12/15/2021 1029 by Ardelle Anton, RN Outcome: Progressing 12/15/2021 1029 by Ardelle Anton, RN Outcome: Progressing   Problem: Education: Goal: Knowledge of [facility] Education information/materials will improve 12/15/2021 1029 by Ardelle Anton, RN Outcome: Progressing 12/15/2021 1029 by Ardelle Anton, RN Outcome: Progressing Goal: Emotional status  will improve 12/15/2021 1029 by Ardelle Anton, RN Outcome: Progressing 12/15/2021 1029 by Ardelle Anton, RN Outcome: Progressing Goal: Mental status will improve 12/15/2021 1029 by Ardelle Anton, RN Outcome: Progressing 12/15/2021 1029 by Ardelle Anton, RN Outcome: Progressing Goal: Verbalization of understanding the information provided will improve 12/15/2021 1029 by Ardelle Anton, RN Outcome: Progressing 12/15/2021 1029 by Ardelle Anton, RN Outcome: Progressing   Problem: Activity: Goal: Interest or engagement in activities will improve 12/15/2021 1029 by Ardelle Anton, RN Outcome: Progressing 12/15/2021 1029 by Ardelle Anton, RN Outcome: Progressing Goal: Sleeping patterns will improve 12/15/2021 1029 by Ardelle Anton, RN Outcome: Progressing 12/15/2021 1029 by Ardelle Anton, RN Outcome: Progressing   Problem: Coping: Goal: Ability to verbalize frustrations and anger appropriately will improve 12/15/2021 1029 by Ardelle Anton, RN Outcome: Progressing 12/15/2021 1029 by Ardelle Anton, RN Outcome: Progressing Goal: Ability to demonstrate self-control will improve 12/15/2021 1029 by Ardelle Anton, RN Outcome: Progressing 12/15/2021 1029 by Ardelle Anton, RN Outcome: Progressing   Problem: Health Behavior/Discharge Planning: Goal: Identification of resources available to assist in meeting health care needs will improve 12/15/2021 1029 by Ardelle Anton, RN Outcome: Progressing 12/15/2021 1029 by Ardelle Anton, RN Outcome: Progressing Goal: Compliance with treatment plan for underlying cause of condition will improve 12/15/2021 1029 by Ardelle Anton, RN Outcome: Progressing 12/15/2021 1029 by Ardelle Anton, RN Outcome: Progressing   Problem: Physical Regulation: Goal: Ability to maintain clinical  measurements within normal limits will improve 12/15/2021 1029 by Ardelle Anton, RN Outcome: Progressing 12/15/2021 1029 by Ardelle Anton, RN Outcome: Progressing   Problem: Safety: Goal: Periods of time without injury will increase 12/15/2021 1029 by Ardelle Anton, RN Outcome: Progressing 12/15/2021 1029 by Ardelle Anton, RN Outcome: Progressing   Problem: Education: Goal: Knowledge of General Education information will improve Description: Including pain rating scale, medication(s)/side effects and non-pharmacologic comfort measures 12/15/2021 1029 by Ardelle Anton, RN Outcome: Progressing 12/15/2021 1029 by Ardelle Anton, RN Outcome: Progressing   Problem: Health Behavior/Discharge Planning: Goal: Ability to manage health-related needs will improve 12/15/2021 1029 by Ardelle Anton, RN Outcome: Progressing 12/15/2021 1029 by Ardelle Anton, RN Outcome: Progressing   Problem: Clinical Measurements: Goal: Ability to maintain clinical measurements within normal limits will improve 12/15/2021 1029 by Ardelle Anton, RN Outcome: Progressing  12/15/2021 1029 by Ardelle Anton, RN Outcome: Progressing Goal: Will remain free from infection 12/15/2021 1029 by Ardelle Anton, RN Outcome: Progressing 12/15/2021 1029 by Ardelle Anton, RN Outcome: Progressing Goal: Diagnostic test results will improve 12/15/2021 1029 by Ardelle Anton, RN Outcome: Progressing 12/15/2021 1029 by Ardelle Anton, RN Outcome: Progressing Goal: Respiratory complications will improve 12/15/2021 1029 by Ardelle Anton, RN Outcome: Progressing 12/15/2021 1029 by Ardelle Anton, RN Outcome: Progressing Goal: Cardiovascular complication will be avoided 12/15/2021 1029 by Ardelle Anton, RN Outcome: Progressing 12/15/2021 1029 by Ardelle Anton,  RN Outcome: Progressing   Problem: Activity: Goal: Risk for activity intolerance will decrease 12/15/2021 1029 by Ardelle Anton, RN Outcome: Progressing 12/15/2021 1029 by Ardelle Anton, RN Outcome: Progressing   Problem: Nutrition: Goal: Adequate nutrition will be maintained 12/15/2021 1029 by Ardelle Anton, RN Outcome: Progressing 12/15/2021 1029 by Ardelle Anton, RN Outcome: Progressing   Problem: Coping: Goal: Level of anxiety will decrease 12/15/2021 1029 by Ardelle Anton, RN Outcome: Progressing 12/15/2021 1029 by Ardelle Anton, RN Outcome: Progressing

## 2021-12-15 NOTE — Group Note (Signed)
[facility] LCSW Group Therapy Note   Group Date: 12/15/2021 Start Time: 1300 End Time: 1400   Type of Therapy/Topic:  Group Therapy:  Emotion Regulation  Participation Level:  Did Not Attend   Mood:  Description of Group:    The purpose of this group is to assist patients in learning to regulate negative emotions and experience positive emotions. Patients will be guided to discuss ways in which they have been vulnerable to their negative emotions. These vulnerabilities will be juxtaposed with experiences of positive emotions or situations, and patients challenged to use positive emotions to combat negative ones. Special emphasis will be placed on coping with negative emotions in conflict situations, and patients will process healthy conflict resolution skills.  Therapeutic Goals: Patient will identify two positive emotions or experiences to reflect on in order to balance out negative emotions:  Patient will label two or more emotions that they find the most difficult to experience:  Patient will be able to demonstrate positive conflict resolution skills through discussion or role plays:   Summary of Patient Progress:   Group not held due to patient acuity on the unit.     Therapeutic Modalities:   Cognitive Behavioral Therapy Feelings Identification Dialectical Behavioral Therapy   Rozann Lesches, LCSW

## 2021-12-15 NOTE — Progress Notes (Signed)
Pt slow to get for breakfast and am medications. Pt he denies SI/HI and AVH. Anxiety and depression score 8/10. Pt isolated in room, in bed with the curtain closed. Pt hard to engage in conversation and he did not want to talk about his mental health. He was encouraged to do his ADLs and to participate in unit activities.

## 2021-12-15 NOTE — Progress Notes (Signed)
Pt more visible on the unit this afternoon and in the TV room. Affect less depressed and more relaxed. Minimal interaction with peer and staff.

## 2021-12-16 DIAGNOSIS — F251 Schizoaffective disorder, depressive type: Secondary | ICD-10-CM | POA: Diagnosis not present

## 2021-12-16 NOTE — Progress Notes (Signed)
Received Tylenol for pain in back and stiff leg.  Reports stemming from playing basketball earlier in the day. Rates pain at 7/10.    C Butler-Nicholson, LPN

## 2021-12-16 NOTE — [facility] Counselor (Addendum)
ADDENDUM  CSW has been made aware that [facility] is only accepting [facility] residents at this time.  CSW spoke with Jonny Ruiz with the shelter.    Penni Homans, MSW, LCSW 12/16/2021 4:05 PM   CSW spoke with the patient on housing options.   Patient reports that he would like to go to [facility].  CSW explained that at this time the Director has declined to have the patient return.  CSW spoke with patient on [facility] and patient was open to this as a shelter option.  CSW spoke with the patient about additional shelter options.  CSW was provided verbal permission to contact additional shelters in search of placement.  CSW contacted [facility] shelter in [place].  CSW had to leave a HIPPA compliant voicemail requesting a return call.  CSW contacted [name] at patient's request.  CSW left HIPPA compliant voicemail.  Penni Homans, MSW, LCSW 12/16/2021 4:04 PM

## 2021-12-16 NOTE — Progress Notes (Signed)
Patient in the day room active, engaging well with other patients on the unit. Reports having a good day today. He discussed the possibility of going to the [facility] house and how it would benefit him. Weighed the pros of having stable housing, moral support and the ability to work.  Discussed the option of receiving ACTT services  as well, as they help with housing and vocational needs.   Patient asked for a list of providers that offer the service. Denies si hi vh. He does endorse ah depression anxiety and pain.  Was given Tylenol earlier at 1932 for pain in back and leg.  Received his qhs medication without incident at this encounter. Will continue to monitor with q 15 minute safety checks.  Encouraged him to seek nursing staff wit any concerns that he may have.     Rosalie Doctor, LPN

## 2021-12-16 NOTE — Progress Notes (Signed)
Recreation Therapy Notes   Date: 12/16/2021  Time: 10:15 am    Location: Craft room     Behavioral response: N/A   Intervention Topic: Leisure   Discussion/Intervention: Patient refused to attend group.   Clinical Observations/Feedback:  Patient refused to attend group.    Naydeen Speirs LRT/CTRS        Halla Chopp 12/16/2021 12:11 PM

## 2021-12-16 NOTE — Plan of Care (Signed)
  Problem: Activity: Goal: Will verbalize the importance of balancing activity with adequate rest periods Outcome: Progressing   Problem: Education: Goal: Will be free of psychotic symptoms Outcome: Progressing Goal: Knowledge of the prescribed therapeutic regimen will improve Outcome: Progressing   Problem: Coping: Goal: Coping ability will improve Outcome: Progressing Goal: Will verbalize feelings Outcome: Progressing   Problem: Self-Concept: Goal: Will verbalize positive feelings about self Outcome: Progressing   Problem: Self-Care: Goal: Ability to participate in self-care as condition permits will improve Outcome: Progressing   Problem: Safety: Goal: Ability to redirect hostility and anger into socially appropriate behaviors will improve Outcome: Progressing Goal: Ability to remain free from injury will improve Outcome: Progressing    Problem: Nutrition: Goal: Adequate nutrition will be maintained Outcome: Progressing

## 2021-12-16 NOTE — Progress Notes (Signed)
Pt visible on the unit limited interaction with peers and staff. He Denies SI/HI and AVH.  He did not give me a score for anxiety and depression. Affect blunted, low energy and no motivation to get up and out of his room. Encouraged to attend groups and to do ADLs.

## 2021-12-16 NOTE — Group Note (Signed)
[facility] LCSW Group Therapy Note   Group Date: 12/16/2021 Start Time: 1300 End Time: 1400   Type of Therapy/Topic:  Group Therapy:  Emotion Regulation  Participation Level:  Did Not Attend   Mood:  Description of Group:    The purpose of this group is to assist patients in learning to regulate negative emotions and experience positive emotions. Patients will be guided to discuss ways in which they have been vulnerable to their negative emotions. These vulnerabilities will be juxtaposed with experiences of positive emotions or situations, and patients challenged to use positive emotions to combat negative ones. Special emphasis will be placed on coping with negative emotions in conflict situations, and patients will process healthy conflict resolution skills.  Therapeutic Goals: Patient will identify two positive emotions or experiences to reflect on in order to balance out negative emotions:  Patient will label two or more emotions that they find the most difficult to experience:  Patient will be able to demonstrate positive conflict resolution skills through discussion or role plays:   Summary of Patient Progress:   Group was offered, however, patient declined to attend.     Therapeutic Modalities:   Cognitive Behavioral Therapy Feelings Identification Dialectical Behavioral Therapy   Rozann Lesches, LCSW

## 2021-12-16 NOTE — Plan of Care (Signed)
See progress note. Problem: Activity: Goal: Will verbalize the importance of balancing activity with adequate rest periods Outcome: Progressing   Problem: Education: Goal: Will be free of psychotic symptoms Outcome: Progressing Goal: Knowledge of the prescribed therapeutic regimen will improve Outcome: Progressing   Problem: Coping: Goal: Coping ability will improve Outcome: Progressing Goal: Will verbalize feelings Outcome: Progressing   Problem: Health Behavior/Discharge Planning: Goal: Compliance with prescribed medication regimen will improve Outcome: Progressing   Problem: Nutritional: Goal: Ability to achieve adequate nutritional intake will improve Outcome: Progressing   Problem: Role Relationship: Goal: Ability to communicate needs accurately will improve Outcome: Progressing Goal: Ability to interact with others will improve Outcome: Progressing   Problem: Safety: Goal: Ability to redirect hostility and anger into socially appropriate behaviors will improve Outcome: Progressing Goal: Ability to remain free from injury will improve Outcome: Progressing   Problem: Self-Care: Goal: Ability to participate in self-care as condition permits will improve Outcome: Progressing   Problem: Self-Concept: Goal: Will verbalize positive feelings about self Outcome: Progressing   Problem: Education: Goal: Utilization of techniques to improve thought processes will improve Outcome: Progressing Goal: Knowledge of the prescribed therapeutic regimen will improve Outcome: Progressing   Problem: Activity: Goal: Interest or engagement in leisure activities will improve Outcome: Progressing Goal: Imbalance in normal sleep/wake cycle will improve Outcome: Progressing   Problem: Coping: Goal: Coping ability will improve Outcome: Progressing Goal: Will verbalize feelings Outcome: Progressing   Problem: Health Behavior/Discharge Planning: Goal: Ability to make decisions  will improve Outcome: Progressing Goal: Compliance with therapeutic regimen will improve Outcome: Progressing   Problem: Role Relationship: Goal: Will demonstrate positive changes in social behaviors and relationships Outcome: Progressing   Problem: Safety: Goal: Ability to disclose and discuss suicidal ideas will improve Outcome: Progressing Goal: Ability to identify and utilize support systems that promote safety will improve Outcome: Progressing   Problem: Self-Concept: Goal: Will verbalize positive feelings about self Outcome: Progressing Goal: Level of anxiety will decrease Outcome: Progressing   Problem: Education: Goal: Knowledge of [facility] Education information/materials will improve Outcome: Progressing Goal: Emotional status will improve Outcome: Progressing Goal: Mental status will improve Outcome: Progressing Goal: Verbalization of understanding the information provided will improve Outcome: Progressing   Problem: Activity: Goal: Interest or engagement in activities will improve Outcome: Progressing Goal: Sleeping patterns will improve Outcome: Progressing   Problem: Coping: Goal: Ability to verbalize frustrations and anger appropriately will improve Outcome: Progressing Goal: Ability to demonstrate self-control will improve Outcome: Progressing   Problem: Health Behavior/Discharge Planning: Goal: Identification of resources available to assist in meeting health care needs will improve Outcome: Progressing Goal: Compliance with treatment plan for underlying cause of condition will improve Outcome: Progressing   Problem: Physical Regulation: Goal: Ability to maintain clinical measurements within normal limits will improve Outcome: Progressing   Problem: Safety: Goal: Periods of time without injury will increase Outcome: Progressing   Problem: Education: Goal: Knowledge of General Education information will improve Description:  Including pain rating scale, medication(s)/side effects and non-pharmacologic comfort measures Outcome: Progressing   Problem: Health Behavior/Discharge Planning: Goal: Ability to manage health-related needs will improve Outcome: Progressing   Problem: Clinical Measurements: Goal: Ability to maintain clinical measurements within normal limits will improve Outcome: Progressing Goal: Will remain free from infection Outcome: Progressing Goal: Diagnostic test results will improve Outcome: Progressing Goal: Respiratory complications will improve Outcome: Progressing Goal: Cardiovascular complication will be avoided Outcome: Progressing   Problem: Activity: Goal: Risk for activity intolerance will decrease Outcome: Progressing     Problem: Nutrition: Goal: Adequate nutrition will be maintained Outcome: Progressing   Problem: Coping: Goal: Level of anxiety will decrease Outcome: Progressing   

## 2021-12-16 NOTE — Progress Notes (Signed)
[facility] MD Progress Note  12/16/2021 3:20 PM Timothy [name]  MRN:  [id number] Subjective: Follow-up patient with schizoaffective disorder.  He was in bed pretty late this morning.  Says he is not feeling as good.  Little more of a downturn compared to yesterday.  No active suicidal plan and still denies hallucinations.  I let him know that we had passed on the information about his wanting to go to [place]. Principal Problem: Schizoaffective disorder, depressive type ([place]) Diagnosis: Principal Problem:   Schizoaffective disorder, depressive type ([place]) Active Problems:   Cannabis use disorder, moderate, dependence ([place])   Homelessness  Total Time spent with patient: 30 minutes  Past Psychiatric History: Past history of schizoaffective disorder multiple hospitalizations difficulty maintaining stability  Past Medical History:  Past Medical History:  Diagnosis Date   Cannabis abuse    Paranoid schizophrenia ([place])    Schizoaffective disorder, depressive type ([place]) 09/17/2014   History reviewed. No pertinent surgical history. Family History:  Family History  Problem Relation Age of Onset   Mental illness Brother    Drug abuse Brother    Mental illness Cousin    Suicidality Cousin    Diabetes Maternal Grandmother    Family Psychiatric  History: See previous Social History:  Social History   Substance and Sexual Activity  Alcohol Use Yes   Alcohol/week: 2.0 standard drinks of alcohol   Types: 2 Cans of beer per week     Social History   Substance and Sexual Activity  Drug Use Yes   Types: Marijuana, MDMA Banker)    Social History   Socioeconomic History   Marital status: Single    Spouse name: Not on file   Number of children: Not on file   Years of education: Not on file   Highest education level: Not on file  Occupational History   Not on file  Tobacco Use   Smoking status: Some Days    Packs/day: 0.25    Types: Cigarettes   Smokeless tobacco: Never  Vaping  Use   Vaping Use: Never used  Substance and Sexual Activity   Alcohol use: Yes    Alcohol/week: 2.0 standard drinks of alcohol    Types: 2 Cans of beer per week   Drug use: Yes    Types: Marijuana, MDMA (Ecstacy)   Sexual activity: Yes  Other Topics Concern   Not on file  Social History Narrative   Not on file   Social Determinants of Health   Financial Resource Strain: Low Risk  (12/31/2019)   Overall Financial Resource Strain (CARDIA)    Difficulty of Paying Living Expenses: Not hard at all  Food Insecurity: No Food Insecurity (12/31/2019)   Hunger Vital Sign    Worried About Running Out of Food in the Last Year: Never true    Ran Out of Food in the Last Year: Never true  Transportation Needs: No Transportation Needs (12/31/2019)   PRAPARE - Hydrologist (Medical): No    Lack of Transportation (Non-Medical): No  Physical Activity: Inactive (12/31/2019)   Exercise Vital Sign    Days of Exercise per Week: 0 days    Minutes of Exercise per Session: 0 min  Stress: Stress Concern Present (12/31/2019)   [place]    Feeling of Stress : Rather much  Social Connections: Moderately Isolated (12/31/2019)   Social Connection and Isolation Panel [NHANES]    Frequency of Communication with Friends  and Family: Once a week    Frequency of Social Gatherings with Friends and Family: Once a week    Attends Religious Services: More than 4 times per year    Active Member of [employer]ubs or Organizations: Yes    Attends Engineer, structuralClub or Organization Meetings: More than 4 times per year    Marital Status: Never married   Additional Social History:                         Sleep: Fair  Appetite:  Fair  Current Medications: Current Facility-Administered Medications  Medication Dose Route Frequency Provider Last Rate Last Admin   acetaminophen (TYLENOL) tablet 650 mg  650 mg Oral Q6H PRN Abbegale Stehle T,  MD   650 mg at 12/15/21 2113   alum & mag hydroxide-simeth (MAALOX/MYLANTA) 200-200-20 MG/5ML suspension 30 mL  30 mL Oral Q4H PRN Shelton Square T, MD       buPROPion (WELLBUTRIN XL) 24 hr tablet 300 mg  300 mg Oral Daily Chandon Lazcano T, MD   300 mg at 12/16/21 0816   divalproex (DEPAKOTE) DR tablet 500 mg  500 mg Oral Q12H Zuria Fosdick, Jackquline DenmarkJohn T, MD   500 mg at 12/16/21 0816   hydrOXYzine (ATARAX) tablet 50 mg  50 mg Oral TID PRN Kamarion Zagami, Jackquline DenmarkJohn T, MD   50 mg at 12/13/21 0817   ibuprofen (ADVIL) tablet 600 mg  600 mg Oral Q6H PRN Lynix Bonine T, MD   600 mg at 12/11/21 1118   LORazepam (ATIVAN) tablet 2 mg  2 mg Oral Q4H PRN Terrance Lanahan, Jackquline DenmarkJohn T, MD   2 mg at 12/15/21 2113   Or   LORazepam (ATIVAN) injection 2 mg  2 mg Intramuscular Q4H PRN Wynee Matarazzo T, MD       magnesium hydroxide (MILK OF MAGNESIA) suspension 30 mL  30 mL Oral Daily PRN Vraj Denardo, Jackquline DenmarkJohn T, MD       neomycin-bacitracin-polymyxin (NEOSPORIN) ointment   Topical PRN Leo RodMalik, Kashif   Given at 12/13/21 0813   nicotine (NICODERM CQ - dosed in mg/24 hours) patch 14 mg  14 mg Transdermal Daily Tarrah Furuta, Jackquline DenmarkJohn T, MD   14 mg at 12/16/21 0818   pantoprazole (PROTONIX) EC tablet 40 mg  40 mg Oral Daily Darionna Banke T, MD   40 mg at 12/16/21 0816   QUEtiapine (SEROQUEL) tablet 400 mg  400 mg Oral QHS Malik, Kashif   400 mg at 12/15/21 2113   ziprasidone (GEODON) injection 20 mg  20 mg Intramuscular Q12H PRN Layken Beg, Jackquline DenmarkJohn T, MD   20 mg at 12/13/21 1336    Lab Results: No results found for this or any previous visit (from the past 48 hour(s)).  Blood Alcohol level:  Lab Results  Component Value Date   ETH <10 12/03/2021   ETH <10 10/31/2021    Metabolic Disorder Labs: Lab Results  Component Value Date   HGBA1C 5.5 11/04/2021   MPG 111.15 11/04/2021   MPG 114 06/17/2021   Lab Results  Component Value Date   PROLACTIN 33.2 (H) 08/15/2016   Lab Results  Component Value Date   CHOL 149 11/04/2021   TRIG 56 11/04/2021   HDL 35 (L)  11/04/2021   CHOLHDL 4.3 11/04/2021   VLDL 11 11/04/2021   LDLCALC 103 (H) 11/04/2021   LDLCALC 111 (H) 06/17/2021    Physical Findings: AIMS:  , ,  ,  ,    CIWA:    COWS:  Musculoskeletal: Strength & Muscle Tone: within normal limits Gait & Station: normal Patient leans: N/A  Psychiatric Specialty Exam:  Presentation  General Appearance: Appropriate for Environment; Casual  Eye Contact:Fair  Speech:Normal Rate  Speech Volume:Decreased  Handedness:Right   Mood and Affect  Mood:Anxious; Depressed  Affect:Constricted   Thought Process  Thought Processes:Coherent  Descriptions of Associations:Intact  Orientation:Full (Time, Place and Person)  Thought Content:Logical  History of Schizophrenia/Schizoaffective disorder:Yes  Duration of Psychotic Symptoms:Greater than six months  Hallucinations:No data recorded Ideas of Reference:None  Suicidal Thoughts:No data recorded Homicidal Thoughts:No data recorded  Sensorium  Memory:Immediate Fair; Remote Good  Judgment:Fair  Insight:Fair   Executive Functions  Concentration:Fair  Attention Span:Fair  [place]   Psychomotor Activity  Psychomotor Activity:No data recorded  Assets  Assets:Communication Skills; Desire for Improvement; Housing   Sleep  Sleep:No data recorded   Physical Exam: Physical Exam Vitals and nursing note reviewed.  Constitutional:      Appearance: Normal appearance.  HENT:     Head: Normocephalic and atraumatic.     Mouth/Throat:     Pharynx: Oropharynx is clear.  Eyes:     Pupils: Pupils are equal, round, and reactive to light.  Cardiovascular:     Rate and Rhythm: Normal rate and regular rhythm.  Pulmonary:     Effort: Pulmonary effort is normal.     Breath sounds: Normal breath sounds.  Abdominal:     General: Abdomen is flat.     Palpations: Abdomen is soft.  Musculoskeletal:        General: Normal range of  motion.  Skin:    General: Skin is warm and dry.  Neurological:     General: No focal deficit present.     Mental Status: He is alert. Mental status is at baseline.  Psychiatric:        Attention and Perception: Attention normal.        Mood and Affect: Mood is depressed. Affect is blunt.        Speech: Speech is delayed.        Behavior: Behavior is slowed.        Thought Content: Thought content normal.        Cognition and Memory: Cognition normal.        Judgment: Judgment normal.    Review of Systems  Constitutional: Negative.   HENT: Negative.    Eyes: Negative.   Respiratory: Negative.    Cardiovascular: Negative.   Gastrointestinal: Negative.   Musculoskeletal: Negative.   Skin: Negative.   Neurological: Negative.   Psychiatric/Behavioral:  Positive for depression. Negative for hallucinations, substance abuse and suicidal ideas.    Blood pressure 126/71, pulse 88, temperature 98.4 F (36.9 C), temperature source Oral, resp. rate 18, height 6\' 3"  (1.905 m), weight 103.9 kg, SpO2 100 %. Body mass index is 28.62 kg/m.   Treatment Plan Summary: Medication management and Plan patient continues to be focused with his anxiety on the realistic problem that he has no place to live.  Encouraged him to keep in touch with treatment team and we will keep working on trying to find a discharge plan.  With no place to go he is very likely to fall apart again.  Alethia Berthold, MD 12/16/2021, 3:20 PM

## 2021-12-17 NOTE — Progress Notes (Signed)
Received Tylenol for stiff leg and back pain.  Rated pain 5/10 at this encounter.        Cleo Butler-Nicholson, LPN

## 2021-12-17 NOTE — Plan of Care (Signed)
  Problem: Decision Making Goal: STG - Patient will successfully identify 2 ways of making healthy decisions post d/c within 5 recreation therapy group sessions Description: STG - Patient will successfully identify 2 ways of making healthy decisions post d/c within 5 recreation therapy group sessions Outcome: Progressing   

## 2021-12-17 NOTE — Progress Notes (Signed)
Recreation Therapy Notes  Date: 12/17/2021   Time: 10:40 am   Location: Courtyard     Behavioral response: Appropriate   Intervention Topic: Social Skills    Discussion/Intervention:  Group content on today was focused on social skills. The group defined social skills and identified ways they use social skills. Patients expressed what obstacles they face when trying to be social. Participants described the importance of social skills. The group listed ways to improve social skills and reasons to improve social skills. Individuals had an opportunity to learn new and improve social skills as well as identify their weaknesses. Clinical Observations/Feedback: Patient came to group late due to unknown reasons. Individual was social with peers and staff while participating in the intervention.   Nihaal Friesen LRT/CTRS           Griffon Herberg 12/17/2021 12:55 PM

## 2021-12-17 NOTE — Progress Notes (Signed)
[facility] MD Progress Note  12/17/2021 6:13 PM Timothy [name]  MRN:  [id number] Subjective: Follow-up patient with schizoaffective disorder.  He was in bed pretty late this morning.  Says he is not feeling as good.  Little more of a downturn compared to yesterday.  No active suicidal plan and still denies hallucinations.  I let him know that we had passed on the information about his wanting to go to [facility] house. Principal Problem: Schizoaffective disorder, depressive type (HCC) Diagnosis: Principal Problem:   Schizoaffective disorder, depressive type (HCC) Active Problems:   Cannabis use disorder, moderate, dependence (HCC)   Homelessness  Total Time spent with patient: 30 minutes  Past Psychiatric History: Past history of schizoaffective disorder multiple hospitalizations difficulty maintaining stability  Past Medical History:  Past Medical History:  Diagnosis Date   Cannabis abuse    Paranoid schizophrenia (HCC)    Schizoaffective disorder, depressive type (HCC) 09/17/2014   History reviewed. No pertinent surgical history. Family History:  Family History  Problem Relation Age of Onset   Mental illness Brother    Drug abuse Brother    Mental illness Cousin    Suicidality Cousin    Diabetes Maternal Grandmother    Family Psychiatric  History: See previous Social History:  Social History   Substance and Sexual Activity  Alcohol Use Yes   Alcohol/week: 2.0 standard drinks of alcohol   Types: 2 Cans of beer per week     Social History   Substance and Sexual Activity  Drug Use Yes   Types: Marijuana, MDMA Chiropodist)    Social History   Socioeconomic History   Marital status: Single    Spouse name: Not on file   Number of children: Not on file   Years of education: Not on file   Highest education level: Not on file  Occupational History   Not on file  Tobacco Use   Smoking status: Some Days    Packs/day: 0.25    Types: Cigarettes   Smokeless tobacco: Never  Vaping  Use   Vaping Use: Never used  Substance and Sexual Activity   Alcohol use: Yes    Alcohol/week: 2.0 standard drinks of alcohol    Types: 2 Cans of beer per week   Drug use: Yes    Types: Marijuana, MDMA (Ecstacy)   Sexual activity: Yes  Other Topics Concern   Not on file  Social History Narrative   Not on file   Social Determinants of Health   Financial Resource Strain: Low Risk  (12/31/2019)   Overall Financial Resource Strain (CARDIA)    Difficulty of Paying Living Expenses: Not hard at all  Food Insecurity: No Food Insecurity (12/31/2019)   Hunger Vital Sign    Worried About Running Out of Food in the Last Year: Never true    Ran Out of Food in the Last Year: Never true  Transportation Needs: No Transportation Needs (12/31/2019)   PRAPARE - Administrator, Civil Service (Medical): No    Lack of Transportation (Non-Medical): No  Physical Activity: Inactive (12/31/2019)   Exercise Vital Sign    Days of Exercise per Week: 0 days    Minutes of Exercise per Session: 0 min  Stress: Stress Concern Present (12/31/2019)   [employer] of Occupational Health - Occupational Stress Questionnaire    Feeling of Stress : Rather much  Social Connections: Moderately Isolated (12/31/2019)   Social Connection and Isolation Panel [NHANES]    Frequency of Communication with Friends  and Family: Once a week    Frequency of Social Gatherings with Friends and Family: Once a week    Attends Religious Services: More than 4 times per year    Active Member of [employer] or Organizations: Yes    Attends Engineer, structural: More than 4 times per year    Marital Status: Never married   Additional Social History:                         Sleep: Fair  Appetite:  Fair  Current Medications: Current Facility-Administered Medications  Medication Dose Route Frequency Provider Last Rate Last Admin   acetaminophen (TYLENOL) tablet 650 mg  650 mg Oral Q6H PRN Mark Hassey T,  MD   650 mg at 12/17/21 0648   alum & mag hydroxide-simeth (MAALOX/MYLANTA) 200-200-20 MG/5ML suspension 30 mL  30 mL Oral Q4H PRN Daymion Nazaire T, MD       buPROPion (WELLBUTRIN XL) 24 hr tablet 300 mg  300 mg Oral Daily Caterin Tabares T, MD   300 mg at 12/17/21 0816   divalproex (DEPAKOTE) DR tablet 500 mg  500 mg Oral Q12H Hailley Byers, Jackquline Denmark, MD   500 mg at 12/17/21 0816   hydrOXYzine (ATARAX) tablet 50 mg  50 mg Oral TID PRN Dorreen Valiente, Jackquline Denmark, MD   50 mg at 12/13/21 0817   ibuprofen (ADVIL) tablet 600 mg  600 mg Oral Q6H PRN Delorese Sellin T, MD   600 mg at 12/11/21 1118   LORazepam (ATIVAN) tablet 2 mg  2 mg Oral Q4H PRN Tekela Garguilo, Jackquline Denmark, MD   2 mg at 12/15/21 2113   Or   LORazepam (ATIVAN) injection 2 mg  2 mg Intramuscular Q4H PRN Reesa Gotschall T, MD       magnesium hydroxide (MILK OF MAGNESIA) suspension 30 mL  30 mL Oral Daily PRN Mili Piltz, Jackquline Denmark, MD       neomycin-bacitracin-polymyxin (NEOSPORIN) ointment   Topical PRN Leo Rod   Given at 12/13/21 0813   nicotine (NICODERM CQ - dosed in mg/24 hours) patch 14 mg  14 mg Transdermal Daily Jaden Batchelder, Jackquline Denmark, MD   14 mg at 12/16/21 0818   pantoprazole (PROTONIX) EC tablet 40 mg  40 mg Oral Daily Sascha Baugher T, MD   40 mg at 12/17/21 0816   QUEtiapine (SEROQUEL) tablet 400 mg  400 mg Oral QHS Malik, Kashif   400 mg at 12/16/21 2110   ziprasidone (GEODON) injection 20 mg  20 mg Intramuscular Q12H PRN Akaisha Truman, Jackquline Denmark, MD   20 mg at 12/13/21 1336    Lab Results: No results found for this or any previous visit (from the past 48 hour(s)).  Blood Alcohol level:  Lab Results  Component Value Date   ETH <10 12/03/2021   ETH <10 10/31/2021    Metabolic Disorder Labs: Lab Results  Component Value Date   HGBA1C 5.5 11/04/2021   MPG 111.15 11/04/2021   MPG 114 06/17/2021   Lab Results  Component Value Date   PROLACTIN 33.2 (H) 08/15/2016   Lab Results  Component Value Date   CHOL 149 11/04/2021   TRIG 56 11/04/2021   HDL 35 (L)  11/04/2021   CHOLHDL 4.3 11/04/2021   VLDL 11 11/04/2021   LDLCALC 103 (H) 11/04/2021   LDLCALC 111 (H) 06/17/2021    Physical Findings: AIMS:  , ,  ,  ,    CIWA:    COWS:  Musculoskeletal: Strength & Muscle Tone: within normal limits Gait & Station: normal Patient leans: N/A  Psychiatric Specialty Exam:  Presentation  General Appearance: Appropriate for Environment; Casual  Eye Contact:Fair  Speech:Normal Rate  Speech Volume:Decreased  Handedness:Right   Mood and Affect  Mood:Anxious; Depressed  Affect:Constricted   Thought Process  Thought Processes:Coherent  Descriptions of Associations:Intact  Orientation:Full (Time, Place and Person)  Thought Content:Logical  History of Schizophrenia/Schizoaffective disorder:Yes  Duration of Psychotic Symptoms:Greater than six months  Hallucinations:No data recorded Ideas of Reference:None  Suicidal Thoughts:No data recorded Homicidal Thoughts:No data recorded  Sensorium  Memory:Immediate Fair; Remote Good  Judgment:Fair  Insight:Fair   Executive Functions  Concentration:Fair  Attention Span:Fair  Recall:Fair  Fund of Knowledge:Fair  Language:Fair   Psychomotor Activity  Psychomotor Activity:No data recorded  Assets  Assets:Communication Skills; Desire for Improvement; Housing   Sleep  Sleep:No data recorded   Physical Exam: Physical Exam Vitals and nursing note reviewed.  Constitutional:      Appearance: Normal appearance.  HENT:     Head: Normocephalic and atraumatic.     Mouth/Throat:     Pharynx: Oropharynx is clear.  Eyes:     Pupils: Pupils are equal, round, and reactive to light.  Cardiovascular:     Rate and Rhythm: Normal rate and regular rhythm.  Pulmonary:     Effort: Pulmonary effort is normal.     Breath sounds: Normal breath sounds.  Abdominal:     General: Abdomen is flat.     Palpations: Abdomen is soft.  Musculoskeletal:        General: Normal range of  motion.  Skin:    General: Skin is warm and dry.  Neurological:     General: No focal deficit present.     Mental Status: He is alert. Mental status is at baseline.  Psychiatric:        Attention and Perception: Attention normal.        Mood and Affect: Mood is depressed. Affect is blunt.        Speech: Speech is delayed.        Behavior: Behavior is slowed.        Thought Content: Thought content normal.        Cognition and Memory: Cognition normal.        Judgment: Judgment normal.    Review of Systems  Constitutional: Negative.   HENT: Negative.    Eyes: Negative.   Respiratory: Negative.    Cardiovascular: Negative.   Gastrointestinal: Negative.   Musculoskeletal: Negative.   Skin: Negative.   Neurological: Negative.   Psychiatric/Behavioral:  Positive for depression. Negative for hallucinations, substance abuse and suicidal ideas.    Blood pressure 122/75, pulse 90, temperature 98.6 F (37 C), temperature source Oral, resp. rate 18, height 6\' 3"  (1.905 m), weight 103.9 kg, SpO2 100 %. Body mass index is 28.62 kg/m.   Treatment Plan Summary: Medication management and Plan patient continues to be focused with his anxiety on the realistic problem that he has no place to live.  Encouraged him to keep in touch with treatment team and we will keep working on trying to find a discharge plan.  With no place to go he is very likely to fall apart again.  , MD 12/17/2021, 6:13 PM

## 2021-12-17 NOTE — Plan of Care (Signed)
  Problem: Education: Goal: Will be free of psychotic symptoms Outcome: Progressing Goal: Knowledge of the prescribed therapeutic regimen will improve Outcome: Progressing   Problem: Coping: Goal: Coping ability will improve Outcome: Progressing Goal: Will verbalize feelings Outcome: Progressing   Problem: Health Behavior/Discharge Planning: Goal: Compliance with prescribed medication regimen will improve Outcome: Progressing   Problem: Role Relationship: Goal: Ability to communicate needs accurately will improve Outcome: Progressing Goal: Ability to interact with others will improve Outcome: Progressing   Problem: Safety: Goal: Ability to redirect hostility and anger into socially appropriate behaviors will improve Outcome: Progressing Goal: Ability to remain free from injury will improve Outcome: Progressing

## 2021-12-17 NOTE — Plan of Care (Signed)
Patient pleasant and cooperative on approach. Verbalized passive SI at times. Denies HI and AVH. Appropriate with staff & peers. Patient looking forward to call [facility] house tomorrow. Appetite and energy level good. ADLs maintained. Support and encouragement given.

## 2021-12-17 NOTE — Group Note (Signed)
[facility] LCSW Group Therapy Note   Group Date: 12/17/2021 Start Time: 1300 End Time: 1400   Type of Therapy/Topic:  Group Therapy:  Balance in Life  Participation Level:  Did Not Attend   Description of Group:    This group will address the concept of balance and how it feels and looks when one is unbalanced. Patients will be encouraged to process areas in their lives that are out of balance, and identify reasons for remaining unbalanced. Facilitators will guide patients utilizing problem- solving interventions to address and correct the stressor making their life unbalanced. Understanding and applying boundaries will be explored and addressed for obtaining  and maintaining a balanced life. Patients will be encouraged to explore ways to assertively make their unbalanced needs known to significant others in their lives, using other group members and facilitator for support and feedback.  Therapeutic Goals: Patient will identify two or more emotions or situations they have that consume much of in their lives. Patient will identify signs/triggers that life has become out of balance:  Patient will identify two ways to set boundaries in order to achieve balance in their lives:  Patient will demonstrate ability to communicate their needs through discussion and/or role plays  Summary of Patient Progress: Invitation for group was extended but pt declined to attend.  Therapeutic Modalities:   Cognitive Behavioral Therapy Solution-Focused Therapy Assertiveness Training   Tineshia Becraft R Synthia Fairbank, LCSW 

## 2021-12-18 DIAGNOSIS — F251 Schizoaffective disorder, depressive type: Secondary | ICD-10-CM | POA: Diagnosis not present

## 2021-12-18 NOTE — Progress Notes (Signed)
Pt more visible, minimal interaction with peers and staff. He denies SI/HI and AVH. No reports on anxiety and depression. Pt up early and on time for breakfast. He had a phone interview with [name] and per SW it went well. No complaints voiced. Encourage do attend groups, clean room and to ADLs.

## 2021-12-18 NOTE — Progress Notes (Signed)
[facility] MD Progress Note  12/18/2021 3:16 PM Timothy [name]  MRN:  [id number] Subjective: Follow-up patient with schizoaffective disorder.  Patient says his mood is feeling pretty good.  Not feeling depressed.  Not having suicidal thoughts.  No hallucinations.  He tried to get into a rehab program today but they turned him down.  He seems to be taking it well and is pursuing other options Principal Problem: Schizoaffective disorder, depressive type (HCC) Diagnosis: Principal Problem:   Schizoaffective disorder, depressive type (HCC) Active Problems:   Cannabis use disorder, moderate, dependence (HCC)   Homelessness  Total Time spent with patient: 30 minutes  Past Psychiatric History: Past history of schizoaffective disorder  Past Medical History:  Past Medical History:  Diagnosis Date   Cannabis abuse    Paranoid schizophrenia (HCC)    Schizoaffective disorder, depressive type (HCC) 09/17/2014   History reviewed. No pertinent surgical history. Family History:  Family History  Problem Relation Age of Onset   Mental illness Brother    Drug abuse Brother    Mental illness Cousin    Suicidality Cousin    Diabetes Maternal Grandmother    Family Psychiatric  History: See previous Social History:  Social History   Substance and Sexual Activity  Alcohol Use Yes   Alcohol/week: 2.0 standard drinks of alcohol   Types: 2 Cans of beer per week     Social History   Substance and Sexual Activity  Drug Use Yes   Types: Marijuana, MDMA Chiropodist)    Social History   Socioeconomic History   Marital status: Single    Spouse name: Not on file   Number of children: Not on file   Years of education: Not on file   Highest education level: Not on file  Occupational History   Not on file  Tobacco Use   Smoking status: Some Days    Packs/day: 0.25    Types: Cigarettes   Smokeless tobacco: Never  Vaping Use   Vaping Use: Never used  Substance and Sexual Activity   Alcohol use: Yes     Alcohol/week: 2.0 standard drinks of alcohol    Types: 2 Cans of beer per week   Drug use: Yes    Types: Marijuana, MDMA (Ecstacy)   Sexual activity: Yes  Other Topics Concern   Not on file  Social History Narrative   Not on file   Social Determinants of Health   Financial Resource Strain: Low Risk  (12/31/2019)   Overall Financial Resource Strain (CARDIA)    Difficulty of Paying Living Expenses: Not hard at all  Food Insecurity: No Food Insecurity (12/31/2019)   Hunger Vital Sign    Worried About Running Out of Food in the Last Year: Never true    Ran Out of Food in the Last Year: Never true  Transportation Needs: No Transportation Needs (12/31/2019)   PRAPARE - Administrator, Civil Service (Medical): No    Lack of Transportation (Non-Medical): No  Physical Activity: Inactive (12/31/2019)   Exercise Vital Sign    Days of Exercise per Week: 0 days    Minutes of Exercise per Session: 0 min  Stress: Stress Concern Present (12/31/2019)   [employer] of Occupational Health - Occupational Stress Questionnaire    Feeling of Stress : Rather much  Social Connections: Moderately Isolated (12/31/2019)   Social Connection and Isolation Panel [NHANES]    Frequency of Communication with Friends and Family: Once a week    Frequency of Social  Gatherings with Friends and Family: Once a week    Attends Religious Services: More than 4 times per year    Active Member of [employer] or Organizations: Yes    Attends Engineer, structural: More than 4 times per year    Marital Status: Never married   Additional Social History:                         Sleep: Fair  Appetite:  Fair  Current Medications: Current Facility-Administered Medications  Medication Dose Route Frequency Provider Last Rate Last Admin   acetaminophen (TYLENOL) tablet 650 mg  650 mg Oral Q6H PRN Blaine Hari T, MD   650 mg at 12/17/21 0648   alum & mag hydroxide-simeth (MAALOX/MYLANTA) 200-200-20  MG/5ML suspension 30 mL  30 mL Oral Q4H PRN Daevion Navarette T, MD       buPROPion (WELLBUTRIN XL) 24 hr tablet 300 mg  300 mg Oral Daily Laela Deviney T, MD   300 mg at 12/18/21 0819   divalproex (DEPAKOTE) DR tablet 500 mg  500 mg Oral Q12H Katharin Schneider, Jackquline Denmark, MD   500 mg at 12/18/21 5462   hydrOXYzine (ATARAX) tablet 50 mg  50 mg Oral TID PRN Matha Masse, Jackquline Denmark, MD   50 mg at 12/13/21 0817   ibuprofen (ADVIL) tablet 600 mg  600 mg Oral Q6H PRN Quientin Jent T, MD   600 mg at 12/11/21 1118   LORazepam (ATIVAN) tablet 2 mg  2 mg Oral Q4H PRN Madelene Kaatz, Jackquline Denmark, MD   2 mg at 12/15/21 2113   Or   LORazepam (ATIVAN) injection 2 mg  2 mg Intramuscular Q4H PRN Lavra Imler T, MD       magnesium hydroxide (MILK OF MAGNESIA) suspension 30 mL  30 mL Oral Daily PRN Maron Stanzione, Jackquline Denmark, MD       neomycin-bacitracin-polymyxin (NEOSPORIN) ointment   Topical PRN Leo Rod   Given at 12/13/21 0813   nicotine (NICODERM CQ - dosed in mg/24 hours) patch 14 mg  14 mg Transdermal Daily Matika Bartell, Jackquline Denmark, MD   14 mg at 12/18/21 0819   pantoprazole (PROTONIX) EC tablet 40 mg  40 mg Oral Daily Cullen Lahaie T, MD   40 mg at 12/18/21 0819   QUEtiapine (SEROQUEL) tablet 400 mg  400 mg Oral QHS Malik, Kashif   400 mg at 12/17/21 2109   ziprasidone (GEODON) injection 20 mg  20 mg Intramuscular Q12H PRN Tiny Chaudhary, Jackquline Denmark, MD   20 mg at 12/13/21 1336    Lab Results: No results found for this or any previous visit (from the past 48 hour(s)).  Blood Alcohol level:  Lab Results  Component Value Date   ETH <10 12/03/2021   ETH <10 10/31/2021    Metabolic Disorder Labs: Lab Results  Component Value Date   HGBA1C 5.5 11/04/2021   MPG 111.15 11/04/2021   MPG 114 06/17/2021   Lab Results  Component Value Date   PROLACTIN 33.2 (H) 08/15/2016   Lab Results  Component Value Date   CHOL 149 11/04/2021   TRIG 56 11/04/2021   HDL 35 (L) 11/04/2021   CHOLHDL 4.3 11/04/2021   VLDL 11 11/04/2021   LDLCALC 103 (H) 11/04/2021    LDLCALC 111 (H) 06/17/2021    Physical Findings: AIMS:  , ,  ,  ,    CIWA:    COWS:     Musculoskeletal: Strength & Muscle Tone: within normal limits Gait &  Station: normal Patient leans: N/A  Psychiatric Specialty Exam:  Presentation  General Appearance: Appropriate for Environment; Casual  Eye Contact:Fair  Speech:Normal Rate  Speech Volume:Decreased  Handedness:Right   Mood and Affect  Mood:Anxious; Depressed  Affect:Constricted   Thought Process  Thought Processes:Coherent  Descriptions of Associations:Intact  Orientation:Full (Time, Place and Person)  Thought Content:Logical  History of Schizophrenia/Schizoaffective disorder:Yes  Duration of Psychotic Symptoms:Greater than six months  Hallucinations:No data recorded Ideas of Reference:None  Suicidal Thoughts:No data recorded Homicidal Thoughts:No data recorded  Sensorium  Memory:Immediate Fair; Remote Good  Judgment:Fair  Insight:Fair   Executive Functions  Concentration:Fair  Attention Span:Fair  Recall:Fair  Fund of Knowledge:Fair  Language:Fair   Psychomotor Activity  Psychomotor Activity:No data recorded  Assets  Assets:Communication Skills; Desire for Improvement; Housing   Sleep  Sleep:No data recorded   Physical Exam: Physical Exam Vitals and nursing note reviewed.  Constitutional:      Appearance: Normal appearance.  HENT:     Head: Normocephalic and atraumatic.     Mouth/Throat:     Pharynx: Oropharynx is clear.  Eyes:     Pupils: Pupils are equal, round, and reactive to light.  Cardiovascular:     Rate and Rhythm: Normal rate and regular rhythm.  Pulmonary:     Effort: Pulmonary effort is normal.     Breath sounds: Normal breath sounds.  Abdominal:     General: Abdomen is flat.     Palpations: Abdomen is soft.  Musculoskeletal:        General: Normal range of motion.  Skin:    General: Skin is warm and dry.  Neurological:     General: No focal  deficit present.     Mental Status: He is alert. Mental status is at baseline.  Psychiatric:        Attention and Perception: Attention normal.        Mood and Affect: Mood normal.        Speech: Speech normal.        Behavior: Behavior is cooperative.        Thought Content: Thought content normal.        Cognition and Memory: Cognition normal.        Judgment: Judgment normal.    Review of Systems  Constitutional: Negative.   HENT: Negative.    Eyes: Negative.   Respiratory: Negative.    Cardiovascular: Negative.   Gastrointestinal: Negative.   Musculoskeletal: Negative.   Skin: Negative.   Neurological: Negative.   Psychiatric/Behavioral: Negative.     Blood pressure 115/70, pulse 88, temperature 98.3 F (36.8 C), resp. rate 16, height 6\' 3"  (1.905 m), weight 103.9 kg, SpO2 99 %. Body mass index is 28.62 kg/m.   Treatment Plan Summary: Medication management and Plan no change to current medication management.  Look forward to possible discharge next week when some safe plan can be implemented.  Supportive therapy review of plan with patient.`  , MD 12/18/2021, 3:16 PM

## 2021-12-18 NOTE — Progress Notes (Signed)
Patient voiced no concerns or complaints this shift. Pleasant and cooperative. Rested well this shift. Denies SI, Hi, AVH. Interacts appropriately with staff and peers. Encouragement and support provided. Safety checks maintained. Medications given as prescribed. Pt remains safe on unit with q 15 min checks.

## 2021-12-18 NOTE — [facility] Counselor (Signed)
CSW spoke with [facility].  [name] reports that they recently changed their admission policy and unfortunately they can no longer accept Schizoaffective Disorder. Patient has been declined at this time.   Penni Homans, MSW, LCSW 12/18/2021 11:00 AM

## 2021-12-18 NOTE — Group Note (Signed)
[facility] LCSW Group Therapy Note   Group Date: 12/18/2021 Start Time: 1300 End Time: 1400   Type of Therapy/Topic:  Group Therapy:  Balance in Life  Participation Level:  Active   Description of Group:    This group will address the concept of balance and how it feels and looks when one is unbalanced. Patients will be encouraged to process areas in their lives that are out of balance, and identify reasons for remaining unbalanced. Facilitators will guide patients utilizing problem- solving interventions to address and correct the stressor making their life unbalanced. Understanding and applying boundaries will be explored and addressed for obtaining  and maintaining a balanced life. Patients will be encouraged to explore ways to assertively make their unbalanced needs known to significant others in their lives, using other group members and facilitator for support and feedback.  Therapeutic Goals: Patient will identify two or more emotions or situations they have that consume much of in their lives. Patient will identify signs/triggers that life has become out of balance:  Patient will identify two ways to set boundaries in order to achieve balance in their lives:  Patient will demonstrate ability to communicate their needs through discussion and/or role plays  Summary of Patient Progress: Patient was present in group.  Patient was an active participant. Patient shared how he utilized music and exercise to maintain balance.  He was able to discuss how not having basic life needs met can affect someone and cause them to feel unbalanced.   Therapeutic Modalities:   Cognitive Behavioral Therapy Solution-Focused Therapy Assertiveness Training   Rozann Lesches, LCSW

## 2021-12-18 NOTE — [facility] Counselor (Signed)
CSW made patient aware of denial from [facility].  Pt accepted this information without incident.  Penni Homans, MSW, LCSW 12/18/2021 3:10 PM

## 2021-12-18 NOTE — Plan of Care (Signed)
Problem: Activity: Goal: Will verbalize the importance of balancing activity with adequate rest periods Outcome: Progressing   Problem: Education: Goal: Will be free of psychotic symptoms Outcome: Progressing Goal: Knowledge of the prescribed therapeutic regimen will improve Outcome: Progressing   Problem: Coping: Goal: Coping ability will improve Outcome: Progressing Goal: Will verbalize feelings Outcome: Progressing   Problem: Health Behavior/Discharge Planning: Goal: Compliance with prescribed medication regimen will improve Outcome: Progressing   Problem: Nutritional: Goal: Ability to achieve adequate nutritional intake will improve Outcome: Progressing   Problem: Role Relationship: Goal: Ability to communicate needs accurately will improve Outcome: Progressing Goal: Ability to interact with others will improve Outcome: Progressing   Problem: Safety: Goal: Ability to redirect hostility and anger into socially appropriate behaviors will improve Outcome: Progressing Goal: Ability to remain free from injury will improve Outcome: Progressing   Problem: Self-Care: Goal: Ability to participate in self-care as condition permits will improve Outcome: Progressing   Problem: Self-Concept: Goal: Will verbalize positive feelings about self Outcome: Progressing   Problem: Education: Goal: Utilization of techniques to improve thought processes will improve Outcome: Progressing Goal: Knowledge of the prescribed therapeutic regimen will improve Outcome: Progressing   Problem: Activity: Goal: Interest or engagement in leisure activities will improve Outcome: Progressing Goal: Imbalance in normal sleep/wake cycle will improve Outcome: Progressing   Problem: Coping: Goal: Coping ability will improve Outcome: Progressing Goal: Will verbalize feelings Outcome: Progressing   Problem: Health Behavior/Discharge Planning: Goal: Ability to make decisions will  improve Outcome: Progressing Goal: Compliance with therapeutic regimen will improve Outcome: Progressing   Problem: Role Relationship: Goal: Will demonstrate positive changes in social behaviors and relationships Outcome: Progressing   Problem: Safety: Goal: Ability to disclose and discuss suicidal ideas will improve Outcome: Progressing Goal: Ability to identify and utilize support systems that promote safety will improve Outcome: Progressing   Problem: Self-Concept: Goal: Will verbalize positive feelings about self Outcome: Progressing Goal: Level of anxiety will decrease Outcome: Progressing   Problem: Education: Goal: Knowledge of [place] [facility] Education information/materials will improve Outcome: Progressing Goal: Emotional status will improve Outcome: Progressing Goal: Mental status will improve Outcome: Progressing Goal: Verbalization of understanding the information provided will improve Outcome: Progressing   Problem: Activity: Goal: Interest or engagement in activities will improve Outcome: Progressing Goal: Sleeping patterns will improve Outcome: Progressing   Problem: Coping: Goal: Ability to verbalize frustrations and anger appropriately will improve Outcome: Progressing Goal: Ability to demonstrate self-control will improve Outcome: Progressing   Problem: Health Behavior/Discharge Planning: Goal: Identification of resources available to assist in meeting health care needs will improve Outcome: Progressing Goal: Compliance with treatment plan for underlying cause of condition will improve Outcome: Progressing   Problem: Physical Regulation: Goal: Ability to maintain clinical measurements within normal limits will improve Outcome: Progressing   Problem: Safety: Goal: Periods of time without injury will increase Outcome: Progressing   Problem: Education: Goal: Knowledge of General Education information will improve Description: Including  pain rating scale, medication(s)/side effects and non-pharmacologic comfort measures Outcome: Progressing   Problem: Health Behavior/Discharge Planning: Goal: Ability to manage health-related needs will improve Outcome: Progressing   Problem: Clinical Measurements: Goal: Ability to maintain clinical measurements within normal limits will improve Outcome: Progressing Goal: Will remain free from infection Outcome: Progressing Goal: Diagnostic test results will improve Outcome: Progressing Goal: Respiratory complications will improve Outcome: Progressing Goal: Cardiovascular complication will be avoided Outcome: Progressing   Problem: Activity: Goal: Risk for activity intolerance will decrease Outcome: Progressing   Problem: Nutrition: Goal:   Adequate nutrition will be maintained Outcome: Progressing   Problem: Coping: Goal: Level of anxiety will decrease Outcome: Progressing

## 2021-12-19 DIAGNOSIS — F251 Schizoaffective disorder, depressive type: Secondary | ICD-10-CM | POA: Diagnosis not present

## 2021-12-19 NOTE — Progress Notes (Signed)
Patient is pleasant an cooperative.  Denies si  hi  avh depression pain and anxiety at this encounter.   He is med compliant and received his qhs meds without incident. He reports that he got turned down for the [facility] and was disappointed in that. Staff provided him with the information for [facility] to see if he would be interested in the service. Advised him to speak with social work as [employer] better know if he is a good candidate for the program.        C Butler-Nicholson, LPN

## 2021-12-19 NOTE — Progress Notes (Signed)
Pt visible on the unit, minimal interaction with peers and staff. Pt out of his room, coloring and working a puzzle. Pt denies SI/HI and AVH. No reports anxiety or depression report. Pt has sutures in his lt lower palm prior to coming to the unit, received them in the ED. Site healing, cap refill in fingers less than 3 second, no complaints of pain and pt has full movement of this hand. On call pysch MD on call to remove sutures today.

## 2021-12-19 NOTE — Progress Notes (Signed)
Psych MD removed the sutures from the patient's lt palm. Site open to air, cover when playing basketball or taking a bath. Pt was given a glove to wear when playing basketball.

## 2021-12-19 NOTE — Plan of Care (Signed)
Problem: Activity: Goal: Will verbalize the importance of balancing activity with adequate rest periods Outcome: Progressing   Problem: Education: Goal: Will be free of psychotic symptoms Outcome: Progressing Goal: Knowledge of the prescribed therapeutic regimen will improve Outcome: Progressing   Problem: Coping: Goal: Coping ability will improve Outcome: Progressing Goal: Will verbalize feelings Outcome: Progressing   Problem: Health Behavior/Discharge Planning: Goal: Compliance with prescribed medication regimen will improve Outcome: Progressing   Problem: Nutritional: Goal: Ability to achieve adequate nutritional intake will improve Outcome: Progressing   Problem: Role Relationship: Goal: Ability to communicate needs accurately will improve Outcome: Progressing Goal: Ability to interact with others will improve Outcome: Progressing   Problem: Safety: Goal: Ability to redirect hostility and anger into socially appropriate behaviors will improve Outcome: Progressing Goal: Ability to remain free from injury will improve Outcome: Progressing   Problem: Self-Care: Goal: Ability to participate in self-care as condition permits will improve Outcome: Progressing   Problem: Self-Concept: Goal: Will verbalize positive feelings about self Outcome: Progressing   Problem: Education: Goal: Utilization of techniques to improve thought processes will improve Outcome: Progressing Goal: Knowledge of the prescribed therapeutic regimen will improve Outcome: Progressing   Problem: Activity: Goal: Interest or engagement in leisure activities will improve Outcome: Progressing Goal: Imbalance in normal sleep/wake cycle will improve Outcome: Progressing   Problem: Coping: Goal: Coping ability will improve Outcome: Progressing Goal: Will verbalize feelings Outcome: Progressing   Problem: Health Behavior/Discharge Planning: Goal: Ability to make decisions will  improve Outcome: Progressing Goal: Compliance with therapeutic regimen will improve Outcome: Progressing   Problem: Role Relationship: Goal: Will demonstrate positive changes in social behaviors and relationships Outcome: Progressing   Problem: Safety: Goal: Ability to disclose and discuss suicidal ideas will improve Outcome: Progressing Goal: Ability to identify and utilize support systems that promote safety will improve Outcome: Progressing   Problem: Self-Concept: Goal: Will verbalize positive feelings about self Outcome: Progressing Goal: Level of anxiety will decrease Outcome: Progressing   Problem: Education: Goal: Knowledge of [place] [facility] Education information/materials will improve Outcome: Progressing Goal: Emotional status will improve Outcome: Progressing Goal: Mental status will improve Outcome: Progressing Goal: Verbalization of understanding the information provided will improve Outcome: Progressing   Problem: Activity: Goal: Interest or engagement in activities will improve Outcome: Progressing Goal: Sleeping patterns will improve Outcome: Progressing   Problem: Coping: Goal: Ability to verbalize frustrations and anger appropriately will improve Outcome: Progressing Goal: Ability to demonstrate self-control will improve Outcome: Progressing   Problem: Health Behavior/Discharge Planning: Goal: Identification of resources available to assist in meeting health care needs will improve Outcome: Progressing Goal: Compliance with treatment plan for underlying cause of condition will improve Outcome: Progressing   Problem: Physical Regulation: Goal: Ability to maintain clinical measurements within normal limits will improve Outcome: Progressing   Problem: Safety: Goal: Periods of time without injury will increase Outcome: Progressing   Problem: Education: Goal: Knowledge of General Education information will improve Description: Including  pain rating scale, medication(s)/side effects and non-pharmacologic comfort measures Outcome: Progressing   Problem: Health Behavior/Discharge Planning: Goal: Ability to manage health-related needs will improve Outcome: Progressing   Problem: Clinical Measurements: Goal: Ability to maintain clinical measurements within normal limits will improve Outcome: Progressing Goal: Will remain free from infection Outcome: Progressing Goal: Diagnostic test results will improve Outcome: Progressing Goal: Respiratory complications will improve Outcome: Progressing Goal: Cardiovascular complication will be avoided Outcome: Progressing   Problem: Activity: Goal: Risk for activity intolerance will decrease Outcome: Progressing   Problem: Nutrition: Goal:  Adequate nutrition will be maintained Outcome: Progressing   Problem: Coping: Goal: Level of anxiety will decrease Outcome: Progressing

## 2021-12-19 NOTE — Progress Notes (Signed)
[facility] MD Progress Note  12/19/2021 10:17 PM Timothy [name]  MRN:  [id number] Subjective: Follow-up patient with schizoaffective disorder.   Pt seen and chart reviewed. Timothy [name] reports feeling better. Denied SI or Hi. Timothy [name] tolerates his meds.   Timothy [name] let the writer took out his stitches, and told the Clinical research associate about his anger and laceration and agreed that Timothy [name] needs better cooperative skills.  Timothy [name] was very pleasant and polite.   Principal Problem: Schizoaffective disorder, depressive type (HCC) Diagnosis: Principal Problem:   Schizoaffective disorder, depressive type (HCC) Active Problems:   Cannabis use disorder, moderate, dependence (HCC)   Homelessness  Total Time spent with patient: 30 minutes  Past Psychiatric History: Past history of schizoaffective disorder  Past Medical History:  Past Medical History:  Diagnosis Date   Cannabis abuse    Paranoid schizophrenia (HCC)    Schizoaffective disorder, depressive type (HCC) 09/17/2014   History reviewed. No pertinent surgical history. Family History:  Family History  Problem Relation Age of Onset   Mental illness Brother    Drug abuse Brother    Mental illness Cousin    Suicidality Cousin    Diabetes Maternal Grandmother    Family Psychiatric  History: See previous Social History:  Social History   Substance and Sexual Activity  Alcohol Use Yes   Alcohol/week: 2.0 standard drinks of alcohol   Types: 2 Cans of beer per week     Social History   Substance and Sexual Activity  Drug Use Yes   Types: Marijuana, MDMA Chiropodist)    Social History   Socioeconomic History   Marital status: Single    Spouse name: Not on file   Number of children: Not on file   Years of education: Not on file   Highest education level: Not on file  Occupational History   Not on file  Tobacco Use   Smoking status: Some Days    Packs/day: 0.25    Types: Cigarettes   Smokeless tobacco: Never  Vaping Use   Vaping Use: Never used  Substance and Sexual  Activity   Alcohol use: Yes    Alcohol/week: 2.0 standard drinks of alcohol    Types: 2 Cans of beer per week   Drug use: Yes    Types: Marijuana, MDMA (Ecstacy)   Sexual activity: Yes  Other Topics Concern   Not on file  Social History Narrative   Not on file   Social Determinants of Health   Financial Resource Strain: Low Risk  (12/31/2019)   Overall Financial Resource Strain (CARDIA)    Difficulty of Paying Living Expenses: Not hard at all  Food Insecurity: No Food Insecurity (12/31/2019)   Hunger Vital Sign    Worried About Running Out of Food in the Last Year: Never true    Ran Out of Food in the Last Year: Never true  Transportation Needs: No Transportation Needs (12/31/2019)   PRAPARE - Administrator, Civil Service (Medical): No    Lack of Transportation (Non-Medical): No  Physical Activity: Inactive (12/31/2019)   Exercise Vital Sign    Days of Exercise per Week: 0 days    Minutes of Exercise per Session: 0 min  Stress: Stress Concern Present (12/31/2019)   [employer] of Occupational Health - Occupational Stress Questionnaire    Feeling of Stress : Rather much  Social Connections: Moderately Isolated (12/31/2019)   Social Connection and Isolation Panel [NHANES]    Frequency of Communication with Friends and Family: Once a week  Frequency of Social Gatherings with Friends and Family: Once a week    Attends Religious Services: More than 4 times per year    Active Member of [employer] or Organizations: Yes    Attends Engineer, structural: More than 4 times per year    Marital Status: Never married   Additional Social History:    Sleep: Fair  Appetite:  Fair  Current Medications: Current Facility-Administered Medications  Medication Dose Route Frequency Provider Last Rate Last Admin   acetaminophen (TYLENOL) tablet 650 mg  650 mg Oral Q6H PRN Clapacs, John T, MD   650 mg at 12/19/21 2111   alum & mag hydroxide-simeth (MAALOX/MYLANTA)  200-200-20 MG/5ML suspension 30 mL  30 mL Oral Q4H PRN Clapacs, John T, MD       buPROPion (WELLBUTRIN XL) 24 hr tablet 300 mg  300 mg Oral Daily Clapacs, John T, MD   300 mg at 12/19/21 3710   divalproex (DEPAKOTE) DR tablet 500 mg  500 mg Oral Q12H Clapacs, John T, MD   500 mg at 12/19/21 2111   hydrOXYzine (ATARAX) tablet 50 mg  50 mg Oral TID PRN Clapacs, Jackquline Denmark, MD   50 mg at 12/13/21 0817   ibuprofen (ADVIL) tablet 600 mg  600 mg Oral Q6H PRN Clapacs, John T, MD   600 mg at 12/11/21 1118   LORazepam (ATIVAN) tablet 2 mg  2 mg Oral Q4H PRN Clapacs, Jackquline Denmark, MD   2 mg at 12/15/21 2113   Or   LORazepam (ATIVAN) injection 2 mg  2 mg Intramuscular Q4H PRN Clapacs, John T, MD       magnesium hydroxide (MILK OF MAGNESIA) suspension 30 mL  30 mL Oral Daily PRN Clapacs, Jackquline Denmark, MD       neomycin-bacitracin-polymyxin (NEOSPORIN) ointment   Topical PRN Danelle Earthly, Kashif   1 Application at 12/19/21 2112   nicotine (NICODERM CQ - dosed in mg/24 hours) patch 14 mg  14 mg Transdermal Daily Clapacs, Jackquline Denmark, MD   14 mg at 12/19/21 0832   pantoprazole (PROTONIX) EC tablet 40 mg  40 mg Oral Daily Clapacs, John T, MD   40 mg at 12/19/21 0829   QUEtiapine (SEROQUEL) tablet 400 mg  400 mg Oral QHS Malik, Kashif   400 mg at 12/19/21 2111   ziprasidone (GEODON) injection 20 mg  20 mg Intramuscular Q12H PRN Clapacs, Jackquline Denmark, MD   20 mg at 12/13/21 1336    Lab Results: No results found for this or any previous visit (from the past 48 hour(s)).  Blood Alcohol level:  Lab Results  Component Value Date   ETH <10 12/03/2021   ETH <10 10/31/2021    Metabolic Disorder Labs: Lab Results  Component Value Date   HGBA1C 5.5 11/04/2021   MPG 111.15 11/04/2021   MPG 114 06/17/2021   Lab Results  Component Value Date   PROLACTIN 33.2 (H) 08/15/2016   Lab Results  Component Value Date   CHOL 149 11/04/2021   TRIG 56 11/04/2021   HDL 35 (L) 11/04/2021   CHOLHDL 4.3 11/04/2021   VLDL 11 11/04/2021   LDLCALC 103 (H)  11/04/2021   LDLCALC 111 (H) 06/17/2021    Physical Findings: AIMS:  , ,  ,  ,    CIWA:    COWS:     Musculoskeletal: Strength & Muscle Tone: within normal limits Gait & Station: normal Patient leans: N/A  Psychiatric Specialty Exam:  Presentation  General Appearance: Appropriate for  Environment; Casual  Eye Contact:Fair  Speech:Normal Rate  Speech Volume:Decreased  Handedness:Right   Mood and Affect  Mood:Anxious; Depressed  Affect:Constricted   Thought Process  Thought Processes:Coherent  Descriptions of Associations:Intact  Orientation:Full (Time, Place and Person)  Thought Content:Logical  History of Schizophrenia/Schizoaffective disorder:Yes  Duration of Psychotic Symptoms:Greater than six months  Hallucinations:No data recorded Ideas of Reference:None  Suicidal Thoughts:No data recorded Homicidal Thoughts:No data recorded  Sensorium  Memory:Immediate Fair; Remote Good  Judgment:Fair  Insight:Fair   Executive Functions  Concentration:Fair  Attention Span:Fair  Recall:Fair  Fund of Knowledge:Fair  Language:Fair   Psychomotor Activity  Psychomotor Activity:No data recorded  Assets  Assets:Communication Skills; Desire for Improvement; Housing   Sleep  Sleep:No data recorded   Physical Exam: Physical Exam Vitals and nursing note reviewed.  Constitutional:      Appearance: Normal appearance.  HENT:     Head: Normocephalic and atraumatic.     Mouth/Throat:     Pharynx: Oropharynx is clear.  Eyes:     Pupils: Pupils are equal, round, and reactive to light.  Cardiovascular:     Rate and Rhythm: Normal rate and regular rhythm.  Pulmonary:     Effort: Pulmonary effort is normal.     Breath sounds: Normal breath sounds.  Abdominal:     General: Abdomen is flat.     Palpations: Abdomen is soft.  Musculoskeletal:        General: Normal range of motion.  Skin:    General: Skin is warm and dry.  Neurological:      General: No focal deficit present.     Mental Status: Betsy Rosello is alert. Mental status is at baseline.  Psychiatric:        Attention and Perception: Attention normal.        Mood and Affect: Mood normal.        Speech: Speech normal.        Behavior: Behavior is cooperative.        Thought Content: Thought content normal.        Cognition and Memory: Cognition normal.        Judgment: Judgment normal.    Review of Systems  Constitutional: Negative.   HENT: Negative.    Eyes: Negative.   Respiratory: Negative.    Cardiovascular: Negative.   Gastrointestinal: Negative.   Musculoskeletal: Negative.   Skin: Negative.   Neurological: Negative.   Psychiatric/Behavioral: Negative.     Blood pressure (!) 141/79, pulse 82, temperature 98.6 F (37 C), temperature source Oral, resp. rate 18, height 6\' 3"  (1.905 m), weight 103.9 kg, SpO2 100 %. Body mass index is 28.62 kg/m.   Treatment Plan Summary: Daily contact with patient to assess and evaluate symptoms and progress in treatment and Medication management  Schizoaffective disorder -- continue depakote 500mg  BID -- seroquel 400mg  qhs -- wellbutrin 300mg  daily.   -- doing better, likely discharge next week, per primary team.   Brooklin Rieger, MD 12/19/2021, 10:17 PM

## 2021-12-20 DIAGNOSIS — F251 Schizoaffective disorder, depressive type: Secondary | ICD-10-CM | POA: Diagnosis not present

## 2021-12-20 NOTE — Plan of Care (Signed)
  Problem: Activity: Goal: Will verbalize the importance of balancing activity with adequate rest periods Outcome: Progressing   Problem: Education: Goal: Will be free of psychotic symptoms Outcome: Progressing Goal: Knowledge of the prescribed therapeutic regimen will improve Outcome: Progressing   Problem: Coping: Goal: Coping ability will improve Outcome: Progressing Goal: Will verbalize feelings Outcome: Progressing   Problem: Health Behavior/Discharge Planning: Goal: Compliance with prescribed medication regimen will improve Outcome: Progressing   Problem: Nutritional: Goal: Ability to achieve adequate nutritional intake will improve Outcome: Progressing   Problem: Role Relationship: Goal: Ability to communicate needs accurately will improve Outcome: Progressing Goal: Ability to interact with others will improve Outcome: Progressing   Problem: Safety: Goal: Ability to redirect hostility and anger into socially appropriate behaviors will improve Outcome: Progressing Goal: Ability to remain free from injury will improve Outcome: Progressing   Problem: Self-Care: Goal: Ability to participate in self-care as condition permits will improve Outcome: Progressing   Problem: Self-Concept: Goal: Will verbalize positive feelings about self Outcome: Progressing   Problem: Education: Goal: Utilization of techniques to improve thought processes will improve Outcome: Progressing Goal: Knowledge of the prescribed therapeutic regimen will improve Outcome: Progressing   Problem: Activity: Goal: Interest or engagement in leisure activities will improve Outcome: Progressing Goal: Imbalance in normal sleep/wake cycle will improve Outcome: Progressing   Problem: Coping: Goal: Coping ability will improve Outcome: Progressing Goal: Will verbalize feelings Outcome: Progressing   Problem: Health Behavior/Discharge Planning: Goal: Ability to make decisions will  improve Outcome: Progressing Goal: Compliance with therapeutic regimen will improve Outcome: Progressing   Problem: Role Relationship: Goal: Will demonstrate positive changes in social behaviors and relationships Outcome: Progressing   Problem: Safety: Goal: Ability to disclose and discuss suicidal ideas will improve Outcome: Progressing Goal: Ability to identify and utilize support systems that promote safety will improve Outcome: Progressing   Problem: Self-Concept: Goal: Will verbalize positive feelings about self Outcome: Progressing Goal: Level of anxiety will decrease Outcome: Progressing   Problem: Education: Goal: Knowledge of [place] [facility] Education information/materials will improve Outcome: Progressing Goal: Emotional status will improve Outcome: Progressing Goal: Mental status will improve Outcome: Progressing Goal: Verbalization of understanding the information provided will improve Outcome: Progressing   Problem: Activity: Goal: Interest or engagement in activities will improve Outcome: Progressing Goal: Sleeping patterns will improve Outcome: Progressing   Problem: Coping: Goal: Ability to verbalize frustrations and anger appropriately will improve Outcome: Progressing Goal: Ability to demonstrate self-control will improve Outcome: Progressing   Problem: Health Behavior/Discharge Planning: Goal: Ability to manage health-related needs will improve Outcome: Progressing   Problem: Education: Goal: Knowledge of General Education information will improve Description: Including pain rating scale, medication(s)/side effects and non-pharmacologic comfort measures Outcome: Progressing   Problem: Safety: Goal: Periods of time without injury will increase Outcome: Progressing

## 2021-12-20 NOTE — Progress Notes (Signed)
[facility] MD Progress Note  12/20/2021 12:45 PM Timothy [name]  MRN:  [id number] Subjective: Follow-up patient with schizoaffective disorder.   Pt seen and chart reviewed.  Timothy [name] reports doing well, but a bit tired in the morning. Tolerates meds. No SI or Hi, no AVH.   Timothy [name] let the writer took out his stitches yesterday.  The wound is healing.   Principal Problem: Schizoaffective disorder, depressive type (HCC) Diagnosis: Principal Problem:   Schizoaffective disorder, depressive type (HCC) Active Problems:   Cannabis use disorder, moderate, dependence (HCC)   Homelessness  Total Time spent with patient: 30 minutes  Past Psychiatric History: Past history of schizoaffective disorder  Past Medical History:  Past Medical History:  Diagnosis Date   Cannabis abuse    Paranoid schizophrenia (HCC)    Schizoaffective disorder, depressive type (HCC) 09/17/2014   History reviewed. No pertinent surgical history. Family History:  Family History  Problem Relation Age of Onset   Mental illness Brother    Drug abuse Brother    Mental illness Cousin    Suicidality Cousin    Diabetes Maternal Grandmother    Family Psychiatric  History: See previous Social History:  Social History   Substance and Sexual Activity  Alcohol Use Yes   Alcohol/week: 2.0 standard drinks of alcohol   Types: 2 Cans of beer per week     Social History   Substance and Sexual Activity  Drug Use Yes   Types: Marijuana, MDMA Chiropodist)    Social History   Socioeconomic History   Marital status: Single    Spouse name: Not on file   Number of children: Not on file   Years of education: Not on file   Highest education level: Not on file  Occupational History   Not on file  Tobacco Use   Smoking status: Some Days    Packs/day: 0.25    Types: Cigarettes   Smokeless tobacco: Never  Vaping Use   Vaping Use: Never used  Substance and Sexual Activity   Alcohol use: Yes    Alcohol/week: 2.0 standard drinks of alcohol     Types: 2 Cans of beer per week   Drug use: Yes    Types: Marijuana, MDMA (Ecstacy)   Sexual activity: Yes  Other Topics Concern   Not on file  Social History Narrative   Not on file   Social Determinants of Health   Financial Resource Strain: Low Risk  (12/31/2019)   Overall Financial Resource Strain (CARDIA)    Difficulty of Paying Living Expenses: Not hard at all  Food Insecurity: No Food Insecurity (12/31/2019)   Hunger Vital Sign    Worried About Running Out of Food in the Last Year: Never true    Ran Out of Food in the Last Year: Never true  Transportation Needs: No Transportation Needs (12/31/2019)   PRAPARE - Administrator, Civil Service (Medical): No    Lack of Transportation (Non-Medical): No  Physical Activity: Inactive (12/31/2019)   Exercise Vital Sign    Days of Exercise per Week: 0 days    Minutes of Exercise per Session: 0 min  Stress: Stress Concern Present (12/31/2019)   [employer] of Occupational Health - Occupational Stress Questionnaire    Feeling of Stress : Rather much  Social Connections: Moderately Isolated (12/31/2019)   Social Connection and Isolation Panel [NHANES]    Frequency of Communication with Friends and Family: Once a week    Frequency of Social Gatherings with Friends and  Family: Once a week    Attends Religious Services: More than 4 times per year    Active Member of Clubs or Organizations: Yes    Attends Engineer, structural: More than 4 times per year    Marital Status: Never married   Additional Social History:    Sleep: Fair  Appetite:  Fair  Current Medications: Current Facility-Administered Medications  Medication Dose Route Frequency Provider Last Rate Last Admin   acetaminophen (TYLENOL) tablet 650 mg  650 mg Oral Q6H PRN Clapacs, John T, MD   650 mg at 12/19/21 2111   alum & mag hydroxide-simeth (MAALOX/MYLANTA) 200-200-20 MG/5ML suspension 30 mL  30 mL Oral Q4H PRN Clapacs, John T, MD        buPROPion (WELLBUTRIN XL) 24 hr tablet 300 mg  300 mg Oral Daily Clapacs, John T, MD   300 mg at 12/20/21 0834   divalproex (DEPAKOTE) DR tablet 500 mg  500 mg Oral Q12H Clapacs, Jackquline Denmark, MD   500 mg at 12/20/21 7782   hydrOXYzine (ATARAX) tablet 50 mg  50 mg Oral TID PRN Clapacs, Jackquline Denmark, MD   50 mg at 12/13/21 0817   ibuprofen (ADVIL) tablet 600 mg  600 mg Oral Q6H PRN Clapacs, John T, MD   600 mg at 12/11/21 1118   LORazepam (ATIVAN) tablet 2 mg  2 mg Oral Q4H PRN Clapacs, Jackquline Denmark, MD   2 mg at 12/15/21 2113   Or   LORazepam (ATIVAN) injection 2 mg  2 mg Intramuscular Q4H PRN Clapacs, John T, MD       magnesium hydroxide (MILK OF MAGNESIA) suspension 30 mL  30 mL Oral Daily PRN Clapacs, Jackquline Denmark, MD       neomycin-bacitracin-polymyxin (NEOSPORIN) ointment   Topical PRN Danelle Earthly, Kashif   1 Application at 12/19/21 2112   nicotine (NICODERM CQ - dosed in mg/24 hours) patch 14 mg  14 mg Transdermal Daily Clapacs, Jackquline Denmark, MD   14 mg at 12/20/21 0835   pantoprazole (PROTONIX) EC tablet 40 mg  40 mg Oral Daily Clapacs, John T, MD   40 mg at 12/20/21 0835   QUEtiapine (SEROQUEL) tablet 400 mg  400 mg Oral QHS Malik, Kashif   400 mg at 12/19/21 2111   ziprasidone (GEODON) injection 20 mg  20 mg Intramuscular Q12H PRN Clapacs, Jackquline Denmark, MD   20 mg at 12/13/21 1336    Lab Results: No results found for this or any previous visit (from the past 48 hour(s)).  Blood Alcohol level:  Lab Results  Component Value Date   ETH <10 12/03/2021   ETH <10 10/31/2021    Metabolic Disorder Labs: Lab Results  Component Value Date   HGBA1C 5.5 11/04/2021   MPG 111.15 11/04/2021   MPG 114 06/17/2021   Lab Results  Component Value Date   PROLACTIN 33.2 (H) 08/15/2016   Lab Results  Component Value Date   CHOL 149 11/04/2021   TRIG 56 11/04/2021   HDL 35 (L) 11/04/2021   CHOLHDL 4.3 11/04/2021   VLDL 11 11/04/2021   LDLCALC 103 (H) 11/04/2021   LDLCALC 111 (H) 06/17/2021    Physical Findings: AIMS:  , ,  ,   ,    CIWA:    COWS:     Musculoskeletal: Strength & Muscle Tone: within normal limits Gait & Station: normal Patient leans: N/A  Psychiatric Specialty Exam:  Presentation  General Appearance: Appropriate for Environment; Casual  Eye Contact:Fair  Speech:Normal  Rate  Speech Volume:Decreased  Handedness:Right   Mood and Affect  Mood:Anxious; Depressed  Affect:Constricted   Thought Process  Thought Processes:Coherent  Descriptions of Associations:Intact  Orientation:Full (Time, Place and Person)  Thought Content:Logical  History of Schizophrenia/Schizoaffective disorder:Yes  Duration of Psychotic Symptoms:Greater than six months  Hallucinations:No data recorded Ideas of Reference:None  Suicidal Thoughts:No data recorded Homicidal Thoughts:No data recorded  Sensorium  Memory:Immediate Fair; Remote Good  Judgment:Fair  Insight:Fair   Executive Functions  Concentration:Fair  Attention Span:Fair  [place]   Psychomotor Activity  Psychomotor Activity:No data recorded  Assets  Assets:Communication Skills; Desire for Improvement; Housing   Sleep  Sleep:No data recorded   Physical Exam: Physical Exam Vitals and nursing note reviewed.  Constitutional:      Appearance: Normal appearance.  HENT:     Head: Normocephalic and atraumatic.     Mouth/Throat:     Pharynx: Oropharynx is clear.  Eyes:     Pupils: Pupils are equal, round, and reactive to light.  Cardiovascular:     Rate and Rhythm: Normal rate and regular rhythm.  Pulmonary:     Effort: Pulmonary effort is normal.     Breath sounds: Normal breath sounds.  Abdominal:     General: Abdomen is flat.     Palpations: Abdomen is soft.  Musculoskeletal:        General: Normal range of motion.  Skin:    General: Skin is warm and dry.  Neurological:     General: No focal deficit present.     Mental Status: Timothy [name] is alert. Mental status is at  baseline.  Psychiatric:        Attention and Perception: Attention normal.        Mood and Affect: Mood normal.        Speech: Speech normal.        Behavior: Behavior is cooperative.        Thought Content: Thought content normal.        Cognition and Memory: Cognition normal.        Judgment: Judgment normal.    Review of Systems  Constitutional: Negative.   HENT: Negative.    Eyes: Negative.   Respiratory: Negative.    Cardiovascular: Negative.   Gastrointestinal: Negative.   Musculoskeletal: Negative.   Skin: Negative.   Neurological: Negative.   Psychiatric/Behavioral: Negative.     Blood pressure 137/71, pulse 93, temperature 97.9 F (36.6 C), temperature source Oral, resp. rate 18, height 6\' 3"  (1.905 m), weight 103.9 kg, SpO2 99 %. Body mass index is 28.62 kg/m.   Treatment Plan Summary: Daily contact with patient to assess and evaluate symptoms and progress in treatment and Medication management  Schizoaffective disorder -- continue depakote 500mg  BID -- seroquel 400mg  qhs -- wellbutrin 300mg  daily.   -- doing better, likely discharge next week, per primary team.   Cacey Willow, MD 12/20/2021, 12:45 PM

## 2021-12-20 NOTE — Progress Notes (Signed)
Pt calm and pleasant during assessment denying SI/HI/AVH. Pt presents with a bright affect. Pt observed interacting appropriately with staff and peers on the unit. Compliant with medication administration per MD orders. Pt given education, support, and encouragement to be active in his treatment plan. Pt being monitored Q 15 minutes for safety per unit protocol. Remains safe on the unit  

## 2021-12-20 NOTE — Group Note (Signed)
LCSW Group Therapy Note   Group Date: 12/20/2021 Start Time: 0945 End Time: 1030    Type of Therapy and Topic:  Group Therapy:  Strengths Exploration   Participation Level: Did Not Attend  Description of Group: This group allows individuals to explore their strengths, learn to use strengths in new ways to improve well-being. Strengths-based interventions involve identifying strengths, understanding how they are used, and learning new ways to apply them. Individuals will identify their strengths, and then explore their roles in different areas of life (relationships, professional life, and personal fulfillment). Individuals will think about ways in which they currently use their strengths, along with new ways they could begin using them.    Therapeutic Goals Patient will verbalize two of their strengths Patient will identify how their strengths are currently used Patient will identify two new ways to apply their strengths  Patients will create a plan to apply their strengths in their daily lives     Summary of Patient Progress:  Did not attend       Therapeutic Modalities Cognitive Behavioral Therapy Motivational Interviewing    Avrey Flanagin MSW, LCSW Clincal Social Worker  [facility]  

## 2021-12-20 NOTE — Plan of Care (Signed)
Met with PT in medication room. Pt is pleasant, denies SI / Hi / AVH. No signs of distress or injury. Pt reports eating breakfast. Pt is adherent with medications. Staff will continue safety precautions.    Problem: Activity: Goal: Will verbalize the importance of balancing activity with adequate rest periods Outcome: Progressing   Problem: Education: Goal: Will be free of psychotic symptoms Outcome: Progressing Goal: Knowledge of the prescribed therapeutic regimen will improve Outcome: Progressing   Problem: Coping: Goal: Coping ability will improve Outcome: Progressing   

## 2021-12-20 NOTE — Progress Notes (Signed)
Patient is in a pleasant mood.  He is active on the unit spending a great deal of time in the dayroom and engages well with the others.  He is med compliant and received his meds without incident.   Hand suture was cleaned and antibiotic cream applied. He denies si hi avh depression and anxiety at this encounter.  Will continue to monitor with q 15 minute safety checks.     C Butler-Nicholson, LPN

## 2021-12-21 DIAGNOSIS — F251 Schizoaffective disorder, depressive type: Secondary | ICD-10-CM | POA: Diagnosis not present

## 2021-12-21 NOTE — BH IP Treatment Plan (Signed)
Interdisciplinary Treatment and Diagnostic Plan Update  12/21/2021 Time of Session: 8:30 AM Timothy [name] MRN: [id number]  Principal Diagnosis: Schizoaffective disorder, depressive type (HCC)  Secondary Diagnoses: Principal Problem:   Schizoaffective disorder, depressive type (HCC) Active Problems:   Cannabis use disorder, moderate, dependence (HCC)   Homelessness   Current Medications:  Current Facility-Administered Medications  Medication Dose Route Frequency Provider Last Rate Last Admin   acetaminophen (TYLENOL) tablet 650 mg  650 mg Oral Q6H PRN Clapacs, John T, MD   650 mg at 12/20/21 2108   alum & mag hydroxide-simeth (MAALOX/MYLANTA) 200-200-20 MG/5ML suspension 30 mL  30 mL Oral Q4H PRN Clapacs, John T, MD       buPROPion (WELLBUTRIN XL) 24 hr tablet 300 mg  300 mg Oral Daily Clapacs, John T, MD   300 mg at 12/21/21 0825   divalproex (DEPAKOTE) DR tablet 500 mg  500 mg Oral Q12H Clapacs, John T, MD   500 mg at 12/21/21 7517   hydrOXYzine (ATARAX) tablet 50 mg  50 mg Oral TID PRN Clapacs, Jackquline Denmark, MD   50 mg at 12/13/21 0817   ibuprofen (ADVIL) tablet 600 mg  600 mg Oral Q6H PRN Clapacs, John T, MD   600 mg at 12/11/21 1118   LORazepam (ATIVAN) tablet 2 mg  2 mg Oral Q4H PRN Clapacs, John T, MD   2 mg at 12/15/21 2113   Or   LORazepam (ATIVAN) injection 2 mg  2 mg Intramuscular Q4H PRN Clapacs, John T, MD       magnesium hydroxide (MILK OF MAGNESIA) suspension 30 mL  30 mL Oral Daily PRN Clapacs, Jackquline Denmark, MD       neomycin-bacitracin-polymyxin (NEOSPORIN) ointment   Topical PRN Danelle Earthly, Kashif   1 Application at 12/19/21 2112   nicotine (NICODERM CQ - dosed in mg/24 hours) patch 14 mg  14 mg Transdermal Daily Clapacs, Jackquline Denmark, MD   14 mg at 12/21/21 0827   pantoprazole (PROTONIX) EC tablet 40 mg  40 mg Oral Daily Clapacs, John T, MD   40 mg at 12/21/21 0825   QUEtiapine (SEROQUEL) tablet 400 mg  400 mg Oral QHS Malik, Kashif   400 mg at 12/20/21 2108   ziprasidone (GEODON)  injection 20 mg  20 mg Intramuscular Q12H PRN Clapacs, John T, MD   20 mg at 12/13/21 1336   PTA Medications: Medications Prior to Admission  Medication Sig Dispense Refill Last Dose   buPROPion (WELLBUTRIN XL) 300 MG 24 hr tablet Take 1 tablet (300 mg total) by mouth daily. 30 tablet 1    FLUoxetine (PROZAC) 20 MG capsule Take 1 capsule (20 mg total) by mouth daily. 30 capsule 1    QUEtiapine (SEROQUEL) 300 MG tablet Take 1 tablet (300 mg total) by mouth at bedtime. 30 tablet 1     Patient Stressors: Financial difficulties   Marital or family conflict   Medication change or noncompliance    Patient Strengths: Motivation for treatment/growth   Treatment Modalities: Medication Management, Group therapy, Case management,  1 to 1 session with clinician, Psychoeducation, Recreational therapy.   Physician Treatment Plan for Primary Diagnosis: Schizoaffective disorder, depressive type (HCC) Long Term Goal(s): Improvement in symptoms so as ready for discharge   Short Term Goals: Compliance with prescribed medications will improve Ability to verbalize feelings will improve Ability to disclose and discuss suicidal ideas Ability to demonstrate self-control will improve  Medication Management: Evaluate patient's response, side effects, and tolerance of medication regimen.  Therapeutic Interventions: 1 to 1 sessions, Unit Group sessions and Medication administration.  Evaluation of Outcomes: Progressing  Physician Treatment Plan for Secondary Diagnosis: Principal Problem:   Schizoaffective disorder, depressive type (HCC) Active Problems:   Cannabis use disorder, moderate, dependence (HCC)   Homelessness  Long Term Goal(s): Improvement in symptoms so as ready for discharge   Short Term Goals: Compliance with prescribed medications will improve Ability to verbalize feelings will improve Ability to disclose and discuss suicidal ideas Ability to demonstrate self-control will improve      Medication Management: Evaluate patient's response, side effects, and tolerance of medication regimen.  Therapeutic Interventions: 1 to 1 sessions, Unit Group sessions and Medication administration.  Evaluation of Outcomes: Progressing   RN Treatment Plan for Primary Diagnosis: Schizoaffective disorder, depressive type (HCC) Long Term Goal(s): Knowledge of disease and therapeutic regimen to maintain health will improve  Short Term Goals: Ability to remain free from injury will improve, Ability to verbalize frustration and anger appropriately will improve, Ability to demonstrate self-control, Ability to participate in decision making will improve, Ability to verbalize feelings will improve, Ability to disclose and discuss suicidal ideas, Ability to identify and develop effective coping behaviors will improve, and Compliance with prescribed medications will improve  Medication Management: RN will administer medications as ordered by provider, will assess and evaluate patient's response and provide education to patient for prescribed medication. RN will report any adverse and/or side effects to prescribing provider.  Therapeutic Interventions: 1 on 1 counseling sessions, Psychoeducation, Medication administration, Evaluate responses to treatment, Monitor vital signs and CBGs as ordered, Perform/monitor CIWA, COWS, AIMS and Fall Risk screenings as ordered, Perform wound care treatments as ordered.  Evaluation of Outcomes: Progressing   LCSW Treatment Plan for Primary Diagnosis: Schizoaffective disorder, depressive type (HCC) Long Term Goal(s): Safe transition to appropriate next level of care at discharge, Engage patient in therapeutic group addressing interpersonal concerns.  Short Term Goals: Engage patient in aftercare planning with referrals and resources, Increase social support, Increase ability to appropriately verbalize feelings, Increase emotional regulation, Facilitate acceptance of  mental health diagnosis and concerns, and Increase skills for wellness and recovery  Therapeutic Interventions: Assess for all discharge needs, 1 to 1 time with Social worker, Explore available resources and support systems, Assess for adequacy in community support network, Educate family and significant other(s) on suicide prevention, Complete Psychosocial Assessment, Interpersonal group therapy.  Evaluation of Outcomes: Progressing   Progress in Treatment: Attending groups: No. Participating in groups: No. Taking medication as prescribed: Yes. Toleration medication: Yes. Family/Significant other contact made: Yes, individual(s) contacted:  mother, Scarlette Slice. Patient understands diagnosis: Yes. Discussing patient identified problems/goals with staff: Yes. Medical problems stabilized or resolved: Yes. Denies suicidal/homicidal ideation: No. Issues/concerns per patient self-inventory: No. Other: none  New problem(s) identified: No, Describe:  none identified.  New Short Term/Long Term Goal(s): elimination of symptoms of psychosis, medication management for mood stabilization; elimination of SI thoughts; development of comprehensive mental wellness/sobriety plan. Update 12/14/21: No changes at this time. Update 12/21/21: No changes at this time.   Patient Goals:  "I'm just here to get myself together" Update 12/14/21: No changes at this time. Update 12/21/21: No changes at this time.    Discharge Plan or Barriers: CSW will continue to meet with the patient and development appropriate discharge plans for the patient. Update 12/14/21: No changes at this time. Update 12/21/21: No changes at this time.   Reason for Continuation of Hospitalization: Aggression Anxiety Depression Hallucinations Medical Issues Medication stabilization  Suicidal ideation   Estimated Length of Stay:  TBD  Last 3 [place] Suicide Severity Risk Score: Flowsheet Row Admission (Current) from 12/07/2021 in [facility]  INPATIENT BEHAVIORAL MEDICINE ED from 12/03/2021 in [facility] Admission (Discharged) from 11/01/2021 in [facility] INPATIENT BEHAVIORAL MEDICINE  C-SSRS RISK CATEGORY High Risk No Risk No Risk       Last PHQ 2/9 Scores:    12/31/2019   10:13 AM  Depression screen PHQ 2/9  Decreased Interest 2  Down, Depressed, Hopeless 1  PHQ - 2 Score 3  Altered sleeping 3  Tired, decreased energy 3  Trouble concentrating 1  Moving slowly or fidgety/restless 2  Suicidal thoughts 2  PHQ-9 Score 14  Difficult doing work/chores Very difficult    Scribe for Treatment Team: [name], LCSW 12/21/2021 8:53 AM

## 2021-12-21 NOTE — Progress Notes (Signed)
Pt calm and pleasant during assessment denying SI/HI/AVH. Pt presents with a bright affect. Pt observed interacting appropriately with staff and peers on the unit. Compliant with medication administration per MD orders. Pt given education, support, and encouragement to be active in his treatment plan. Pt being monitored Q 15 minutes for safety per unit protocol. Remains safe on the unit

## 2021-12-21 NOTE — Progress Notes (Signed)
Recreation Therapy Notes   Date: 12/21/2021  Time: 10:40 am    Location: Craft room    Behavioral response: N/A   Intervention Topic: Goals    Discussion/Intervention: Patient refused to attend group.   Clinical Observations/Feedback:  Patient refused to attend group.    [name] LRT/CTRS        Timothy [name] 12/21/2021 1:32 PM 

## 2021-12-21 NOTE — Group Note (Addendum)
[facility] LCSW Group Therapy Note    Group Date: 12/21/2021 Start Time: 1300 End Time: 1400  Type of Therapy and Topic:  Group Therapy:  Overcoming Obstacles  Participation Level:  [facility] PARTICIPATION LEVEL: Active  Description of Group:   In this group patients will be encouraged to explore what they see as obstacles to their own wellness and recovery. They will be guided to discuss their thoughts, feelings, and behaviors related to these obstacles. The group will process together ways to cope with barriers, with attention given to specific choices patients can make. Each patient will be challenged to identify changes they are motivated to make in order to overcome their obstacles. This group will be process-oriented, with patients participating in exploration of their own experiences as well as giving and receiving support and challenge from other group members.  Therapeutic Goals: 1. Patient will identify personal and current obstacles as they relate to admission. 2. Patient will identify barriers that currently interfere with their wellness or overcoming obstacles.  3. Patient will identify feelings, thought process and behaviors related to these barriers. 4. Patient will identify two changes they are willing to make to overcome these obstacles:    Summary of Patient Progress Patient was present for the entirety of the discussion. He stated that memories of his past, and the things that he used to have, are often an obstacle for him. Pt shared that when he begins to think about these things too heavily then it triggers his mental health symptoms which often leads to rehospitalization. He stated that moving forward he needs to remain focused on his recovery. Pt endorsed utilizing coping skills like listening to music, prayer, meditation, and exercise to help with remaining focused. He shared that he got rid of his old friends by isolating himself. Pt  was cautioned that putting distance between himself and friends that encourage old habits is good to do but to continue to isolate himself from the world essentially keeps him from meeting new people who may be more helpful but also from learning new things that would benefit him in his wellness path. Pt was open and receptive to feedback and comments from peer and facilitator.   Therapeutic Modalities:   Cognitive Behavioral Therapy Solution Focused Therapy Motivational Interviewing Relapse Prevention Therapy   [name], LCSW

## 2021-12-21 NOTE — Progress Notes (Signed)
[facility] MD Progress Note  12/21/2021 1:20 PM [facility]  MRN:  [id number] Subjective: Follow-up 26 year old man with schizoaffective disorder.  Patient seen and chart reviewed.  Patient has no new complaints.  Reports that his mood has been stable.  A little depressed but not actively suicidal.  He comes out and interacts most of the day appropriately.  No physical complaints Principal Problem: Schizoaffective disorder, depressive type (HCC) Diagnosis: Principal Problem:   Schizoaffective disorder, depressive type (HCC) Active Problems:   Cannabis use disorder, moderate, dependence (HCC)   Homelessness  Total Time spent with patient: 30 minutes  Past Psychiatric History: Past history of schizoaffective disorder both manic and depressed with recent psychosis now apparently remitting  Past Medical History:  Past Medical History:  Diagnosis Date   Cannabis abuse    Paranoid schizophrenia (HCC)    Schizoaffective disorder, depressive type (HCC) 09/17/2014   History reviewed. No pertinent surgical history. Family History:  Family History  Problem Relation Age of Onset   Mental illness Brother    Drug abuse Brother    Mental illness Cousin    Suicidality Cousin    Diabetes Maternal Grandmother    Family Psychiatric  History: See previous Social History:  Social History   Substance and Sexual Activity  Alcohol Use Yes   Alcohol/week: 2.0 standard drinks of alcohol   Types: 2 Cans of beer per week     Social History   Substance and Sexual Activity  Drug Use Yes   Types: Marijuana, MDMA Chiropodist)    Social History   Socioeconomic History   Marital status: Single    Spouse name: Not on file   Number of children: Not on file   Years of education: Not on file   Highest education level: Not on file  Occupational History   Not on file  Tobacco Use   Smoking status: Some Days    Packs/day: 0.25    Types: Cigarettes   Smokeless tobacco: Never  Vaping Use   Vaping Use:  Never used  Substance and Sexual Activity   Alcohol use: Yes    Alcohol/week: 2.0 standard drinks of alcohol    Types: 2 Cans of beer per week   Drug use: Yes    Types: Marijuana, MDMA (Ecstacy)   Sexual activity: Yes  Other Topics Concern   Not on file  Social History Narrative   Not on file   Social Determinants of Health   Financial Resource Strain: Low Risk  (12/31/2019)   Overall Financial Resource Strain (CARDIA)    Difficulty of Paying Living Expenses: Not hard at all  Food Insecurity: No Food Insecurity (12/31/2019)   Hunger Vital Sign    Worried About Running Out of Food in the Last Year: Never true    Ran Out of Food in the Last Year: Never true  Transportation Needs: No Transportation Needs (12/31/2019)   PRAPARE - Administrator, Civil Service (Medical): No    Lack of Transportation (Non-Medical): No  Physical Activity: Inactive (12/31/2019)   Exercise Vital Sign    Days of Exercise per Week: 0 days    Minutes of Exercise per Session: 0 min  Stress: Stress Concern Present (12/31/2019)   [employer] of Occupational Health - Occupational Stress Questionnaire    Feeling of Stress : Rather much  Social Connections: Moderately Isolated (12/31/2019)   Social Connection and Isolation Panel [NHANES]    Frequency of Communication with Friends and Family: Once a week  Frequency of Social Gatherings with Friends and Family: Once a week    Attends Religious Services: More than 4 times per year    Active Member of [employer] or Organizations: Yes    Attends Engineer, structural: More than 4 times per year    Marital Status: Never married   Additional Social History:                         Sleep: Fair  Appetite:  Fair  Current Medications: Current Facility-Administered Medications  Medication Dose Route Frequency Provider Last Rate Last Admin   acetaminophen (TYLENOL) tablet 650 mg  650 mg Oral Q6H PRN Efrem Pitstick T, MD   650 mg at  12/20/21 2108   alum & mag hydroxide-simeth (MAALOX/MYLANTA) 200-200-20 MG/5ML suspension 30 mL  30 mL Oral Q4H PRN Joshuan Bolander T, MD       buPROPion (WELLBUTRIN XL) 24 hr tablet 300 mg  300 mg Oral Daily Almeda Ezra T, MD   300 mg at 12/21/21 0825   divalproex (DEPAKOTE) DR tablet 500 mg  500 mg Oral Q12H Lesha Jager, Jackquline Denmark, MD   500 mg at 12/21/21 6440   hydrOXYzine (ATARAX) tablet 50 mg  50 mg Oral TID PRN Aaniya Sterba, Jackquline Denmark, MD   50 mg at 12/13/21 0817   ibuprofen (ADVIL) tablet 600 mg  600 mg Oral Q6H PRN Samit Sylve T, MD   600 mg at 12/11/21 1118   LORazepam (ATIVAN) tablet 2 mg  2 mg Oral Q4H PRN Kyleah Pensabene, Jackquline Denmark, MD   2 mg at 12/15/21 2113   Or   LORazepam (ATIVAN) injection 2 mg  2 mg Intramuscular Q4H PRN Jacee Enerson T, MD       magnesium hydroxide (MILK OF MAGNESIA) suspension 30 mL  30 mL Oral Daily PRN Gilberto Stanforth, Jackquline Denmark, MD       neomycin-bacitracin-polymyxin (NEOSPORIN) ointment   Topical PRN Danelle Earthly, Kashif   1 Application at 12/19/21 2112   nicotine (NICODERM CQ - dosed in mg/24 hours) patch 14 mg  14 mg Transdermal Daily Alesia Oshields T, MD   14 mg at 12/21/21 0827   pantoprazole (PROTONIX) EC tablet 40 mg  40 mg Oral Daily Jonisha Kindig T, MD   40 mg at 12/21/21 0825   QUEtiapine (SEROQUEL) tablet 400 mg  400 mg Oral QHS Malik, Kashif   400 mg at 12/20/21 2108   ziprasidone (GEODON) injection 20 mg  20 mg Intramuscular Q12H PRN Takeira Yanes, Jackquline Denmark, MD   20 mg at 12/13/21 1336    Lab Results: No results found for this or any previous visit (from the past 48 hour(s)).  Blood Alcohol level:  Lab Results  Component Value Date   ETH <10 12/03/2021   ETH <10 10/31/2021    Metabolic Disorder Labs: Lab Results  Component Value Date   HGBA1C 5.5 11/04/2021   MPG 111.15 11/04/2021   MPG 114 06/17/2021   Lab Results  Component Value Date   PROLACTIN 33.2 (H) 08/15/2016   Lab Results  Component Value Date   CHOL 149 11/04/2021   TRIG 56 11/04/2021   HDL 35 (L) 11/04/2021    CHOLHDL 4.3 11/04/2021   VLDL 11 11/04/2021   LDLCALC 103 (H) 11/04/2021   LDLCALC 111 (H) 06/17/2021    Physical Findings: AIMS:  , ,  ,  ,    CIWA:    COWS:     Musculoskeletal: Strength & Muscle Tone:  within normal limits Gait & Station: normal Patient leans: N/A  Psychiatric Specialty Exam:  Presentation  General Appearance: Appropriate for Environment; Casual  Eye Contact:Fair  Speech:Normal Rate  Speech Volume:Decreased  Handedness:Right   Mood and Affect  Mood:Anxious; Depressed  Affect:Constricted   Thought Process  Thought Processes:Coherent  Descriptions of Associations:Intact  Orientation:Full (Time, Place and Person)  Thought Content:Logical  History of Schizophrenia/Schizoaffective disorder:Yes  Duration of Psychotic Symptoms:Greater than six months  Hallucinations:No data recorded Ideas of Reference:None  Suicidal Thoughts:No data recorded Homicidal Thoughts:No data recorded  Sensorium  Memory:Immediate Fair; Remote Good  Judgment:Fair  Insight:Fair   Executive Functions  Concentration:Fair  Attention Span:Fair  Recall:Fair  Fund of Knowledge:Fair  Language:Fair   Psychomotor Activity  Psychomotor Activity:No data recorded  Assets  Assets:Communication Skills; Desire for Improvement; Housing   Sleep  Sleep:No data recorded   Physical Exam: Physical Exam Vitals and nursing note reviewed.  Constitutional:      Appearance: Normal appearance.  HENT:     Head: Normocephalic and atraumatic.     Mouth/Throat:     Pharynx: Oropharynx is clear.  Eyes:     Pupils: Pupils are equal, round, and reactive to light.  Cardiovascular:     Rate and Rhythm: Normal rate and regular rhythm.  Pulmonary:     Effort: Pulmonary effort is normal.     Breath sounds: Normal breath sounds.  Abdominal:     General: Abdomen is flat.     Palpations: Abdomen is soft.  Musculoskeletal:        General: Normal range of motion.   Skin:    General: Skin is warm and dry.  Neurological:     General: No focal deficit present.     Mental Status: He is alert. Mental status is at baseline.  Psychiatric:        Attention and Perception: Attention normal.        Mood and Affect: Mood normal.        Speech: Speech normal.        Behavior: Behavior normal.        Thought Content: Thought content normal.        Cognition and Memory: Cognition normal.        Judgment: Judgment normal.    Review of Systems  Constitutional: Negative.   HENT: Negative.    Eyes: Negative.   Respiratory: Negative.    Cardiovascular: Negative.   Gastrointestinal: Negative.   Musculoskeletal: Negative.   Skin: Negative.   Neurological: Negative.   Psychiatric/Behavioral: Negative.     Blood pressure 116/76, pulse 78, temperature 97.6 F (36.4 C), temperature source Oral, resp. rate 18, height 6\' 3"  (1.905 m), weight 103.9 kg, SpO2 98 %. Body mass index is 28.62 kg/m.   Treatment Plan Summary: Plan no change to medication.  Encourage patient to keep looking for a place to stay.  I think he is getting closer to his baseline but we want to make sure that we have done everything we can to give him the best opportunity for things not to go badly at discharge.  , MD 12/21/2021, 1:20 PM

## 2021-12-21 NOTE — Plan of Care (Signed)
See progress notes. Problem: Activity: Goal: Will verbalize the importance of balancing activity with adequate rest periods Outcome: Progressing   Problem: Education: Goal: Will be free of psychotic symptoms Outcome: Progressing Goal: Knowledge of the prescribed therapeutic regimen will improve Outcome: Progressing   Problem: Coping: Goal: Coping ability will improve Outcome: Progressing Goal: Will verbalize feelings Outcome: Progressing   Problem: Health Behavior/Discharge Planning: Goal: Compliance with prescribed medication regimen will improve Outcome: Progressing   Problem: Nutritional: Goal: Ability to achieve adequate nutritional intake will improve Outcome: Progressing   Problem: Role Relationship: Goal: Ability to communicate needs accurately will improve Outcome: Progressing Goal: Ability to interact with others will improve Outcome: Progressing   Problem: Safety: Goal: Ability to redirect hostility and anger into socially appropriate behaviors will improve Outcome: Progressing Goal: Ability to remain free from injury will improve Outcome: Progressing   Problem: Self-Care: Goal: Ability to participate in self-care as condition permits will improve Outcome: Progressing   Problem: Self-Concept: Goal: Will verbalize positive feelings about self Outcome: Progressing   Problem: Education: Goal: Utilization of techniques to improve thought processes will improve Outcome: Progressing Goal: Knowledge of the prescribed therapeutic regimen will improve Outcome: Progressing   Problem: Activity: Goal: Interest or engagement in leisure activities will improve Outcome: Progressing Goal: Imbalance in normal sleep/wake cycle will improve Outcome: Progressing   Problem: Coping: Goal: Coping ability will improve Outcome: Progressing Goal: Will verbalize feelings Outcome: Progressing   Problem: Health Behavior/Discharge Planning: Goal: Ability to make decisions  will improve Outcome: Progressing Goal: Compliance with therapeutic regimen will improve Outcome: Progressing   Problem: Role Relationship: Goal: Will demonstrate positive changes in social behaviors and relationships Outcome: Progressing   Problem: Safety: Goal: Ability to disclose and discuss suicidal ideas will improve Outcome: Progressing Goal: Ability to identify and utilize support systems that promote safety will improve Outcome: Progressing   Problem: Self-Concept: Goal: Will verbalize positive feelings about self Outcome: Progressing Goal: Level of anxiety will decrease Outcome: Progressing   Problem: Education: Goal: Knowledge of [place] [facility] Education information/materials will improve Outcome: Progressing Goal: Emotional status will improve Outcome: Progressing Goal: Mental status will improve Outcome: Progressing Goal: Verbalization of understanding the information provided will improve Outcome: Progressing   Problem: Activity: Goal: Interest or engagement in activities will improve Outcome: Progressing Goal: Sleeping patterns will improve Outcome: Progressing   Problem: Coping: Goal: Ability to verbalize frustrations and anger appropriately will improve Outcome: Progressing Goal: Ability to demonstrate self-control will improve Outcome: Progressing   Problem: Health Behavior/Discharge Planning: Goal: Identification of resources available to assist in meeting health care needs will improve Outcome: Progressing Goal: Compliance with treatment plan for underlying cause of condition will improve Outcome: Progressing   Problem: Physical Regulation: Goal: Ability to maintain clinical measurements within normal limits will improve Outcome: Progressing   Problem: Safety: Goal: Periods of time without injury will increase Outcome: Progressing   Problem: Education: Goal: Knowledge of General Education information will improve Description:  Including pain rating scale, medication(s)/side effects and non-pharmacologic comfort measures Outcome: Progressing   Problem: Health Behavior/Discharge Planning: Goal: Ability to manage health-related needs will improve Outcome: Progressing   Problem: Clinical Measurements: Goal: Ability to maintain clinical measurements within normal limits will improve Outcome: Progressing Goal: Will remain free from infection Outcome: Progressing Goal: Diagnostic test results will improve Outcome: Progressing Goal: Respiratory complications will improve Outcome: Progressing Goal: Cardiovascular complication will be avoided Outcome: Progressing   Problem: Activity: Goal: Risk for activity intolerance will decrease Outcome: Progressing  Problem: Nutrition: Goal: Adequate nutrition will be maintained Outcome: Progressing   Problem: Coping: Goal: Level of anxiety will decrease Outcome: Progressing   

## 2021-12-21 NOTE — Progress Notes (Signed)
Pt up for breakfast, visible on the unit, no interaction with peer or staff. Pt talks when spoken to otherwise extremely quiet. He denies SI/HI and AH. No reports of SI.HI and AVH. NO complaints. Encouraged to do his ADLs and to attend groups. Pt is a placement issue, if he had a place to stay he could be on the ACT team

## 2021-12-22 DIAGNOSIS — F251 Schizoaffective disorder, depressive type: Secondary | ICD-10-CM | POA: Diagnosis not present

## 2021-12-22 NOTE — Progress Notes (Signed)
Recreation Therapy Notes  Date: 12/22/2021  Time: 1:15pm    Location: Craft room   Behavioral response: Appropriate  Intervention Topic:  Problem Solving   Discussion/Intervention:  Group content on today was focused on problem solving. The group described what problem solving is. Patients expressed how problems affect them and how they deal with problems. Individuals identified healthy ways to deal with problems. Patients explained what normally happens to them when they do not deal with problems. The group expressed reoccurring problems for them. The group participated in the intervention "Ways to Solve problems" where patients were given a chance to explore different ways to solve problems.  Clinical Observations/Feedback: Patient came to group and defined problem solving as not taking sides. He stated that he problem solves by exercising and listening to music. Participant expressed that working on strategies are important in problem solving. Individual was social with peers and staff while participating in the intervention.    Raynah Gomes LRT/CTRS         John Williamsen 12/22/2021 2:16 PM

## 2021-12-22 NOTE — Progress Notes (Signed)
[facility] MD Progress Note  12/22/2021 4:25 PM Timothy [name]  MRN:  [id number] Subjective: No complaints.  No suicidal ideation.  No active psychosis.  No change to overall condition Principal Problem: Schizoaffective disorder, depressive type (HCC) Diagnosis: Principal Problem:   Schizoaffective disorder, depressive type (HCC) Active Problems:   Cannabis use disorder, moderate, dependence (HCC)   Homelessness  Total Time spent with patient: 20 minutes  Past Psychiatric History: Past history of bipolar disorder  Past Medical History:  Past Medical History:  Diagnosis Date   Cannabis abuse    Paranoid schizophrenia (HCC)    Schizoaffective disorder, depressive type (HCC) 09/17/2014   History reviewed. No pertinent surgical history. Family History:  Family History  Problem Relation Age of Onset   Mental illness Brother    Drug abuse Brother    Mental illness Cousin    Suicidality Cousin    Diabetes Maternal Grandmother    Family Psychiatric  History: See previous Social History:  Social History   Substance and Sexual Activity  Alcohol Use Yes   Alcohol/week: 2.0 standard drinks of alcohol   Types: 2 Cans of beer per week     Social History   Substance and Sexual Activity  Drug Use Yes   Types: Marijuana, MDMA Chiropodist)    Social History   Socioeconomic History   Marital status: Single    Spouse name: Not on file   Number of children: Not on file   Years of education: Not on file   Highest education level: Not on file  Occupational History   Not on file  Tobacco Use   Smoking status: Some Days    Packs/day: 0.25    Types: Cigarettes   Smokeless tobacco: Never  Vaping Use   Vaping Use: Never used  Substance and Sexual Activity   Alcohol use: Yes    Alcohol/week: 2.0 standard drinks of alcohol    Types: 2 Cans of beer per week   Drug use: Yes    Types: Marijuana, MDMA (Ecstacy)   Sexual activity: Yes  Other Topics Concern   Not on file  Social History  Narrative   Not on file   Social Determinants of Health   Financial Resource Strain: Low Risk  (12/31/2019)   Overall Financial Resource Strain (CARDIA)    Difficulty of Paying Living Expenses: Not hard at all  Food Insecurity: No Food Insecurity (12/31/2019)   Hunger Vital Sign    Worried About Running Out of Food in the Last Year: Never true    Ran Out of Food in the Last Year: Never true  Transportation Needs: No Transportation Needs (12/31/2019)   PRAPARE - Administrator, Civil Service (Medical): No    Lack of Transportation (Non-Medical): No  Physical Activity: Inactive (12/31/2019)   Exercise Vital Sign    Days of Exercise per Week: 0 days    Minutes of Exercise per Session: 0 min  Stress: Stress Concern Present (12/31/2019)   [employer] of Occupational Health - Occupational Stress Questionnaire    Feeling of Stress : Rather much  Social Connections: Moderately Isolated (12/31/2019)   Social Connection and Isolation Panel [NHANES]    Frequency of Communication with Friends and Family: Once a week    Frequency of Social Gatherings with Friends and Family: Once a week    Attends Religious Services: More than 4 times per year    Active Member of [employer] or Organizations: Yes    Attends Banker Meetings:  More than 4 times per year    Marital Status: Never married   Additional Social History:                         Sleep: Fair  Appetite:  Fair  Current Medications: Current Facility-Administered Medications  Medication Dose Route Frequency Provider Last Rate Last Admin   acetaminophen (TYLENOL) tablet 650 mg  650 mg Oral Q6H PRN Dennie Moltz T, MD   650 mg at 12/22/21 0819   alum & mag hydroxide-simeth (MAALOX/MYLANTA) 200-200-20 MG/5ML suspension 30 mL  30 mL Oral Q4H PRN Myra Weng T, MD       buPROPion (WELLBUTRIN XL) 24 hr tablet 300 mg  300 mg Oral Daily Mary-Anne Polizzi T, MD   300 mg at 12/22/21 0818   divalproex (DEPAKOTE) DR  tablet 500 mg  500 mg Oral Q12H Tiaja Hagan T, MD   500 mg at 12/22/21 0818   hydrOXYzine (ATARAX) tablet 50 mg  50 mg Oral TID PRN Danijela Vessey, Jackquline Denmark, MD   50 mg at 12/13/21 0817   ibuprofen (ADVIL) tablet 600 mg  600 mg Oral Q6H PRN Estanislao Harmon T, MD   600 mg at 12/11/21 1118   LORazepam (ATIVAN) tablet 2 mg  2 mg Oral Q4H PRN Krystine Pabst, Jackquline Denmark, MD   2 mg at 12/15/21 2113   Or   LORazepam (ATIVAN) injection 2 mg  2 mg Intramuscular Q4H PRN Blakelynn Scheeler T, MD       magnesium hydroxide (MILK OF MAGNESIA) suspension 30 mL  30 mL Oral Daily PRN Nelson Noone, Jackquline Denmark, MD       neomycin-bacitracin-polymyxin (NEOSPORIN) ointment   Topical PRN Danelle Earthly, Kashif   1 Application at 12/19/21 2112   nicotine (NICODERM CQ - dosed in mg/24 hours) patch 14 mg  14 mg Transdermal Daily Eliam Snapp T, MD   14 mg at 12/22/21 0821   pantoprazole (PROTONIX) EC tablet 40 mg  40 mg Oral Daily Michai Dieppa T, MD   40 mg at 12/22/21 0818   QUEtiapine (SEROQUEL) tablet 400 mg  400 mg Oral QHS Malik, Kashif   400 mg at 12/21/21 2120   ziprasidone (GEODON) injection 20 mg  20 mg Intramuscular Q12H PRN Eligh Rybacki, Jackquline Denmark, MD   20 mg at 12/13/21 1336    Lab Results: No results found for this or any previous visit (from the past 48 hour(s)).  Blood Alcohol level:  Lab Results  Component Value Date   ETH <10 12/03/2021   ETH <10 10/31/2021    Metabolic Disorder Labs: Lab Results  Component Value Date   HGBA1C 5.5 11/04/2021   MPG 111.15 11/04/2021   MPG 114 06/17/2021   Lab Results  Component Value Date   PROLACTIN 33.2 (H) 08/15/2016   Lab Results  Component Value Date   CHOL 149 11/04/2021   TRIG 56 11/04/2021   HDL 35 (L) 11/04/2021   CHOLHDL 4.3 11/04/2021   VLDL 11 11/04/2021   LDLCALC 103 (H) 11/04/2021   LDLCALC 111 (H) 06/17/2021    Physical Findings: AIMS: Facial and Oral Movements Muscles of Facial Expression: None, normal Lips and Perioral Area: None, normal Jaw: None, normal Tongue: None,  normal,Extremity Movements Upper (arms, wrists, hands, fingers): None, normal Lower (legs, knees, ankles, toes): None, normal, Trunk Movements Neck, shoulders, hips: None, normal, Overall Severity Severity of abnormal movements (highest score from questions above): None, normal Incapacitation due to abnormal movements: None, normal Patient's  awareness of abnormal movements (rate only patient's report): No Awareness, Dental Status Current problems with teeth and/or dentures?: No Does patient usually wear dentures?: No  CIWA:    COWS:     Musculoskeletal: Strength & Muscle Tone: within normal limits Gait & Station: normal Patient leans: N/A  Psychiatric Specialty Exam:  Presentation  General Appearance: Appropriate for Environment; Casual  Eye Contact:Fair  Speech:Normal Rate  Speech Volume:Decreased  Handedness:Right   Mood and Affect  Mood:Anxious; Depressed  Affect:Constricted   Thought Process  Thought Processes:Coherent  Descriptions of Associations:Intact  Orientation:Full (Time, Place and Person)  Thought Content:Logical  History of Schizophrenia/Schizoaffective disorder:Yes  Duration of Psychotic Symptoms:Greater than six months  Hallucinations:No data recorded Ideas of Reference:None  Suicidal Thoughts:No data recorded Homicidal Thoughts:No data recorded  Sensorium  Memory:Immediate Fair; Remote Good  Judgment:Fair  Insight:Fair   Executive Functions  Concentration:Fair  Attention Span:Fair  Recall:Fair  Fund of Knowledge:Fair  Language:Fair   Psychomotor Activity  Psychomotor Activity:No data recorded  Assets  Assets:Communication Skills; Desire for Improvement; Housing   Sleep  Sleep:No data recorded   Physical Exam: Physical Exam Vitals and nursing note reviewed.  Constitutional:      Appearance: Normal appearance.  HENT:     Head: Normocephalic and atraumatic.     Mouth/Throat:     Pharynx: Oropharynx is clear.   Eyes:     Pupils: Pupils are equal, round, and reactive to light.  Cardiovascular:     Rate and Rhythm: Normal rate and regular rhythm.  Pulmonary:     Effort: Pulmonary effort is normal.     Breath sounds: Normal breath sounds.  Abdominal:     General: Abdomen is flat.     Palpations: Abdomen is soft.  Musculoskeletal:        General: Normal range of motion.  Skin:    General: Skin is warm and dry.  Neurological:     General: No focal deficit present.     Mental Status: He is alert. Mental status is at baseline.  Psychiatric:        Mood and Affect: Mood normal.        Thought Content: Thought content normal.    Review of Systems  Constitutional: Negative.   HENT: Negative.    Eyes: Negative.   Respiratory: Negative.    Cardiovascular: Negative.   Gastrointestinal: Negative.   Musculoskeletal: Negative.   Skin: Negative.   Neurological: Negative.   Psychiatric/Behavioral: Negative.     Blood pressure 113/77, pulse 83, temperature 98.5 F (36.9 C), temperature source Oral, resp. rate 18, height 6\' 3"  (1.905 m), weight 103.9 kg, SpO2 100 %. Body mass index is 28.62 kg/m.   Treatment Plan Summary: Plan patient has no place to stay and is high risk for decompensation out on the street.  No change to medication.  Treatment team continues to work on discharge planning  , MD 12/22/2021, 4:25 PM

## 2021-12-22 NOTE — Progress Notes (Signed)
Pt denies SI, HI, AVH and pain. Rates his depression and anxiety both 10/10 with bright affect, fixed smile. Pt's affect is incongruent with mood. Declined PRN Vistaril when offered "No, I'm good right now". Remains medication compliant, denies adverse drug reactions when assessed. Cooperative with care and unit routines. Placement continues to be an issue. Q 15 minutes safety checks maintained without incident. Pt attended scheduled groups, engaged appropriately with peers and staff on interactions. Support, encouragement and reassurance offered to pt. He tolerates all medications, meals and fluids well without discomfort. Safety maintained in milieu.

## 2021-12-22 NOTE — Group Note (Signed)
[facility] LCSW Group Therapy Note   Group Date: 12/22/2021 Start Time: 1000 End Time: 1100  Type of Therapy/Topic:  Group Therapy:  Feelings about Diagnosis  Participation Level:  Did Not Attend   Description of Group:    This group will allow patients to explore their thoughts and feelings about diagnoses they have received. Patients will be guided to explore their level of understanding and acceptance of these diagnoses. Facilitator will encourage patients to process their thoughts and feelings about the reactions of others to their diagnosis, and will guide patients in identifying ways to discuss their diagnosis with significant others in their lives. This group will be process-oriented, with patients participating in exploration of their own experiences as well as giving and receiving support and challenge from other group members.   Therapeutic Goals: 1. Patient will demonstrate understanding of diagnosis as evidence by identifying two or more symptoms of the disorder:  2. Patient will be able to express two feelings regarding the diagnosis 3. Patient will demonstrate ability to communicate their needs through discussion and/or role plays  Summary of Patient Progress: X    Therapeutic Modalities:   Cognitive Behavioral Therapy Brief Therapy Feelings Identification    Kagan Hietpas J Real Cona, LCSW 

## 2021-12-23 NOTE — Progress Notes (Signed)
Recreation Therapy Notes    Date: 12/23/2021   Time: 10:45 am     Location: Craft room     Behavioral response: N/A   Intervention Topic: Decision Making    Discussion/Intervention: Patient refused to attend group.    Clinical Observations/Feedback:  Patient refused to attend group.    Lynel Forester LRT/CTRS        Clytie Shetley 12/23/2021 10:59 AM

## 2021-12-23 NOTE — Plan of Care (Signed)
  Problem: Education: Goal: Knowledge of the prescribed therapeutic regimen will improve Outcome: Progressing   Problem: Coping: Goal: Will verbalize feelings Outcome: Progressing   Problem: Health Behavior/Discharge Planning: Goal: Compliance with prescribed medication regimen will improve Outcome: Progressing   Problem: Nutritional: Goal: Ability to achieve adequate nutritional intake will improve Outcome: Progressing   Problem: Role Relationship: Goal: Ability to communicate needs accurately will improve Outcome: Progressing   Problem: Education: Goal: Knowledge of the prescribed therapeutic regimen will improve Outcome: Progressing   Problem: Safety: Goal: Ability to disclose and discuss suicidal ideas will improve Outcome: Progressing   Problem: Education: Goal: Knowledge of [facility] Education information/materials will improve Outcome: Progressing

## 2021-12-23 NOTE — Progress Notes (Signed)
Patient was cooperative with treatment and pleasant on approach , he denies SI, HI & AVH. He spoke to current Clinical research associate about finding a place to live upon discharge and a job. He seemed to rest well through out the night.

## 2021-12-23 NOTE — [facility] Counselor (Signed)
CSW spoke with the patient on ACTT referrals and [facility] referral.  Pt requested that both referrals be sent.  CSW sent referral for [facility] ACTT team.  Penni Homans, MSW, LCSW 12/23/2021 4:13 PM

## 2021-12-23 NOTE — Progress Notes (Signed)
[facility] MD Progress Note  12/23/2021 5:27 PM Timothy [name]  MRN:  [id number] Subjective: No complaints.  No suicidal ideation.  No active psychosis.  No change to overall condition Principal Problem: Schizoaffective disorder, depressive type (HCC) Diagnosis: Principal Problem:   Schizoaffective disorder, depressive type (HCC) Active Problems:   Cannabis use disorder, moderate, dependence (HCC)   Homelessness  Total Time spent with patient: 20 minutes  Past Psychiatric History: Past history of bipolar disorder  Past Medical History:  Past Medical History:  Diagnosis Date   Cannabis abuse    Paranoid schizophrenia (HCC)    Schizoaffective disorder, depressive type (HCC) 09/17/2014   History reviewed. No pertinent surgical history. Family History:  Family History  Problem Relation Age of Onset   Mental illness Brother    Drug abuse Brother    Mental illness Cousin    Suicidality Cousin    Diabetes Maternal Grandmother    Family Psychiatric  History: See previous Social History:  Social History   Substance and Sexual Activity  Alcohol Use Yes   Alcohol/week: 2.0 standard drinks of alcohol   Types: 2 Cans of beer per week     Social History   Substance and Sexual Activity  Drug Use Yes   Types: Marijuana, MDMA Chiropodist)    Social History   Socioeconomic History   Marital status: Single    Spouse name: Not on file   Number of children: Not on file   Years of education: Not on file   Highest education level: Not on file  Occupational History   Not on file  Tobacco Use   Smoking status: Some Days    Packs/day: 0.25    Types: Cigarettes   Smokeless tobacco: Never  Vaping Use   Vaping Use: Never used  Substance and Sexual Activity   Alcohol use: Yes    Alcohol/week: 2.0 standard drinks of alcohol    Types: 2 Cans of beer per week   Drug use: Yes    Types: Marijuana, MDMA (Ecstacy)   Sexual activity: Yes  Other Topics Concern   Not on file  Social History  Narrative   Not on file   Social Determinants of Health   Financial Resource Strain: Low Risk  (12/31/2019)   Overall Financial Resource Strain (CARDIA)    Difficulty of Paying Living Expenses: Not hard at all  Food Insecurity: No Food Insecurity (12/31/2019)   Hunger Vital Sign    Worried About Running Out of Food in the Last Year: Never true    Ran Out of Food in the Last Year: Never true  Transportation Needs: No Transportation Needs (12/31/2019)   PRAPARE - Administrator, Civil Service (Medical): No    Lack of Transportation (Non-Medical): No  Physical Activity: Inactive (12/31/2019)   Exercise Vital Sign    Days of Exercise per Week: 0 days    Minutes of Exercise per Session: 0 min  Stress: Stress Concern Present (12/31/2019)   [employer] of Occupational Health - Occupational Stress Questionnaire    Feeling of Stress : Rather much  Social Connections: Moderately Isolated (12/31/2019)   Social Connection and Isolation Panel [NHANES]    Frequency of Communication with Friends and Family: Once a week    Frequency of Social Gatherings with Friends and Family: Once a week    Attends Religious Services: More than 4 times per year    Active Member of [employer] or Organizations: Yes    Attends Banker Meetings:  More than 4 times per year    Marital Status: Never married   Additional Social History:                         Sleep: Fair  Appetite:  Fair  Current Medications: Current Facility-Administered Medications  Medication Dose Route Frequency Provider Last Rate Last Admin   acetaminophen (TYLENOL) tablet 650 mg  650 mg Oral Q6H PRN Aleah Ahlgrim T, MD   650 mg at 12/22/21 0819   alum & mag hydroxide-simeth (MAALOX/MYLANTA) 200-200-20 MG/5ML suspension 30 mL  30 mL Oral Q4H PRN Zaccheus Edmister T, MD       buPROPion (WELLBUTRIN XL) 24 hr tablet 300 mg  300 mg Oral Daily Lucio Litsey T, MD   300 mg at 12/23/21 0801   divalproex (DEPAKOTE) DR  tablet 500 mg  500 mg Oral Q12H General Wearing T, MD   500 mg at 12/23/21 0801   hydrOXYzine (ATARAX) tablet 50 mg  50 mg Oral TID PRN Aylana Hirschfeld, Jackquline Denmark, MD   50 mg at 12/13/21 0817   ibuprofen (ADVIL) tablet 600 mg  600 mg Oral Q6H PRN Arayna Illescas T, MD   600 mg at 12/23/21 0801   LORazepam (ATIVAN) tablet 2 mg  2 mg Oral Q4H PRN Tallie Dodds, Jackquline Denmark, MD   2 mg at 12/15/21 2113   Or   LORazepam (ATIVAN) injection 2 mg  2 mg Intramuscular Q4H PRN Summer Mccolgan T, MD       magnesium hydroxide (MILK OF MAGNESIA) suspension 30 mL  30 mL Oral Daily PRN Aidan Caloca, Jackquline Denmark, MD       neomycin-bacitracin-polymyxin (NEOSPORIN) ointment   Topical PRN Danelle Earthly, Kashif   1 Application at 12/19/21 2112   nicotine (NICODERM CQ - dosed in mg/24 hours) patch 14 mg  14 mg Transdermal Daily Eudora Guevarra T, MD   14 mg at 12/23/21 0803   pantoprazole (PROTONIX) EC tablet 40 mg  40 mg Oral Daily Pauline Pegues T, MD   40 mg at 12/23/21 0801   QUEtiapine (SEROQUEL) tablet 400 mg  400 mg Oral QHS Malik, Kashif   400 mg at 12/22/21 2112   ziprasidone (GEODON) injection 20 mg  20 mg Intramuscular Q12H PRN Sharrie Self, Jackquline Denmark, MD   20 mg at 12/13/21 1336    Lab Results: No results found for this or any previous visit (from the past 48 hour(s)).  Blood Alcohol level:  Lab Results  Component Value Date   ETH <10 12/03/2021   ETH <10 10/31/2021    Metabolic Disorder Labs: Lab Results  Component Value Date   HGBA1C 5.5 11/04/2021   MPG 111.15 11/04/2021   MPG 114 06/17/2021   Lab Results  Component Value Date   PROLACTIN 33.2 (H) 08/15/2016   Lab Results  Component Value Date   CHOL 149 11/04/2021   TRIG 56 11/04/2021   HDL 35 (L) 11/04/2021   CHOLHDL 4.3 11/04/2021   VLDL 11 11/04/2021   LDLCALC 103 (H) 11/04/2021   LDLCALC 111 (H) 06/17/2021    Physical Findings: AIMS: Facial and Oral Movements Muscles of Facial Expression: None, normal Lips and Perioral Area: None, normal Jaw: None, normal Tongue: None,  normal,Extremity Movements Upper (arms, wrists, hands, fingers): None, normal Lower (legs, knees, ankles, toes): None, normal, Trunk Movements Neck, shoulders, hips: None, normal, Overall Severity Severity of abnormal movements (highest score from questions above): None, normal Incapacitation due to abnormal movements: None, normal Patient's  awareness of abnormal movements (rate only patient's report): No Awareness, Dental Status Current problems with teeth and/or dentures?: No Does patient usually wear dentures?: No  CIWA:    COWS:     Musculoskeletal: Strength & Muscle Tone: within normal limits Gait & Station: normal Patient leans: N/A  Psychiatric Specialty Exam:  Presentation  General Appearance: Appropriate for Environment; Casual  Eye Contact:Fair  Speech:Normal Rate  Speech Volume:Decreased  Handedness:Right   Mood and Affect  Mood:Anxious; Depressed  Affect:Constricted   Thought Process  Thought Processes:Coherent  Descriptions of Associations:Intact  Orientation:Full (Time, Place and Person)  Thought Content:Logical  History of Schizophrenia/Schizoaffective disorder:Yes  Duration of Psychotic Symptoms:Greater than six months  Hallucinations:No data recorded Ideas of Reference:None  Suicidal Thoughts:No data recorded Homicidal Thoughts:No data recorded  Sensorium  Memory:Immediate Fair; Remote Good  Judgment:Fair  Insight:Fair   Executive Functions  Concentration:Fair  Attention Span:Fair  Recall:Fair  Fund of Knowledge:Fair  Language:Fair   Psychomotor Activity  Psychomotor Activity:No data recorded  Assets  Assets:Communication Skills; Desire for Improvement; Housing   Sleep  Sleep:No data recorded   Physical Exam: Physical Exam Vitals and nursing note reviewed.  Constitutional:      Appearance: Normal appearance.  HENT:     Head: Normocephalic and atraumatic.     Mouth/Throat:     Pharynx: Oropharynx is clear.   Eyes:     Pupils: Pupils are equal, round, and reactive to light.  Cardiovascular:     Rate and Rhythm: Normal rate and regular rhythm.  Pulmonary:     Effort: Pulmonary effort is normal.     Breath sounds: Normal breath sounds.  Abdominal:     General: Abdomen is flat.     Palpations: Abdomen is soft.  Musculoskeletal:        General: Normal range of motion.  Skin:    General: Skin is warm and dry.  Neurological:     General: No focal deficit present.     Mental Status: He is alert. Mental status is at baseline.  Psychiatric:        Mood and Affect: Mood normal.        Thought Content: Thought content normal.    Review of Systems  Constitutional: Negative.   HENT: Negative.    Eyes: Negative.   Respiratory: Negative.    Cardiovascular: Negative.   Gastrointestinal: Negative.   Musculoskeletal: Negative.   Skin: Negative.   Neurological: Negative.   Psychiatric/Behavioral: Negative.     Blood pressure 113/77, pulse 83, temperature 98.5 F (36.9 C), temperature source Oral, resp. rate 18, height 6\' 3"  (1.905 m), weight 103.9 kg, SpO2 100 %. Body mass index is 28.62 kg/m.   Treatment Plan Summary: Plan patient has no place to stay and is high risk for decompensation out on the street.  No change to medication.  Treatment team continues to work on discharge planning  , MD 12/23/2021, 5:27 PM

## 2021-12-23 NOTE — Group Note (Signed)
[facility] LCSW Group Therapy Note   Group Date: 12/23/2021 Start Time: 1300 End Time: 1400   Type of Therapy/Topic:  Group Therapy:  Emotion Regulation  Participation Level:  Active   Description of Group:    The purpose of this group is to assist patients in learning to regulate negative emotions and experience positive emotions. Patients will be guided to discuss ways in which they have been vulnerable to their negative emotions. These vulnerabilities will be juxtaposed with experiences of positive emotions or situations, and patients challenged to use positive emotions to combat negative ones. Special emphasis will be placed on coping with negative emotions in conflict situations, and patients will process healthy conflict resolution skills.  Therapeutic Goals: Patient will identify two positive emotions or experiences to reflect on in order to balance out negative emotions:  Patient will label two or more emotions that they find the most difficult to experience:  Patient will be able to demonstrate positive conflict resolution skills through discussion or role plays:   Summary of Patient Progress: Patient was present for the entirety of group. He was actively involved in the conversation. Pt stated that frustration was a strong negative emotion for him due to dealing with lack of help/support. He stated that he knew he was frustrated/upset because he experiences hot flashes. However, pt also shared an experience of being nervous and addressing this by talking to other people. When asked how this experience affected him, he stated that the situation was not that bad and turned out good. He identified drawing and listening to music as things that help calm him down. Pt's comments were pertinent to the discussion and he was open and receptive to feedback/comments from peers and facilitator.    Therapeutic Modalities:   Cognitive Behavioral Therapy Feelings Identification Dialectical Behavioral  Therapy   [name], LCSW

## 2021-12-23 NOTE — Progress Notes (Signed)
D: Patient alert and oriented. Patient rates back pain 7/10. PRN ibuprofen was provided to patient. Patient rated  5/10 when reassessed. Patient denies anxiety. Patient rates depression 6/10. Patient denies HI/VH. Patient endorses SI and auditory hallucinations stating he hears whispers but can not understand what the voices are saying. Patient contracts for safety.  Patient has been observed in the dayroom watching television, attending social work group, and went out to the courtyard when patient were given the opportunity.   A: Scheduled medications administered to patient, per MD orders. Support and encouragement provided to patient.  Q15 minute safety checks maintained.   R: Patient compliant with medication administration and treatment plan. No adverse drug reactions noted. Patient remains safe on the unit at this time.

## 2021-12-24 DIAGNOSIS — F251 Schizoaffective disorder, depressive type: Secondary | ICD-10-CM | POA: Diagnosis not present

## 2021-12-24 NOTE — Plan of Care (Signed)
  Problem: Coping: Goal: Coping ability will improve Outcome: Progressing Goal: Will verbalize feelings Outcome: Progressing   Problem: Nutritional: Goal: Ability to achieve adequate nutritional intake will improve Outcome: Progressing   Problem: Role Relationship: Goal: Ability to communicate needs accurately will improve Outcome: Progressing Goal: Ability to interact with others will improve Outcome: Progressing   Problem: Safety: Goal: Ability to redirect hostility and anger into socially appropriate behaviors will improve Outcome: Progressing

## 2021-12-24 NOTE — BH IP Treatment Plan (Signed)
Interdisciplinary Treatment and Diagnostic Plan Update  12/24/2021 Time of Session: 8:30 AM Timothy [name] MRN: [id number]  Principal Diagnosis: Schizoaffective disorder, depressive type (HCC)  Secondary Diagnoses: Principal Problem:   Schizoaffective disorder, depressive type (HCC) Active Problems:   Cannabis use disorder, moderate, dependence (HCC)   Homelessness   Current Medications:  Current Facility-Administered Medications  Medication Dose Route Frequency Provider Last Rate Last Admin   acetaminophen (TYLENOL) tablet 650 mg  650 mg Oral Q6H PRN Clapacs, John T, MD   650 mg at 12/24/21 0815   alum & mag hydroxide-simeth (MAALOX/MYLANTA) 200-200-20 MG/5ML suspension 30 mL  30 mL Oral Q4H PRN Clapacs, John T, MD       buPROPion (WELLBUTRIN XL) 24 hr tablet 300 mg  300 mg Oral Daily Clapacs, John T, MD   300 mg at 12/24/21 0815   divalproex (DEPAKOTE) DR tablet 500 mg  500 mg Oral Q12H Clapacs, John T, MD   500 mg at 12/24/21 0816   hydrOXYzine (ATARAX) tablet 50 mg  50 mg Oral TID PRN Clapacs, Jackquline Denmark, MD   50 mg at 12/13/21 0817   ibuprofen (ADVIL) tablet 600 mg  600 mg Oral Q6H PRN Clapacs, John T, MD   600 mg at 12/23/21 2107   LORazepam (ATIVAN) tablet 2 mg  2 mg Oral Q4H PRN Clapacs, John T, MD   2 mg at 12/24/21 1351   Or   LORazepam (ATIVAN) injection 2 mg  2 mg Intramuscular Q4H PRN Clapacs, John T, MD       magnesium hydroxide (MILK OF MAGNESIA) suspension 30 mL  30 mL Oral Daily PRN Clapacs, Jackquline Denmark, MD       neomycin-bacitracin-polymyxin (NEOSPORIN) ointment   Topical PRN Danelle Earthly, Kashif   1 Application at 12/19/21 2112   nicotine (NICODERM CQ - dosed in mg/24 hours) patch 14 mg  14 mg Transdermal Daily Clapacs, Jackquline Denmark, MD   14 mg at 12/24/21 0816   pantoprazole (PROTONIX) EC tablet 40 mg  40 mg Oral Daily Clapacs, John T, MD   40 mg at 12/24/21 0816   QUEtiapine (SEROQUEL) tablet 400 mg  400 mg Oral QHS Malik, Kashif   400 mg at 12/23/21 2106   ziprasidone (GEODON)  injection 20 mg  20 mg Intramuscular Q12H PRN Clapacs, John T, MD   20 mg at 12/13/21 1336   PTA Medications: Medications Prior to Admission  Medication Sig Dispense Refill Last Dose   buPROPion (WELLBUTRIN XL) 300 MG 24 hr tablet Take 1 tablet (300 mg total) by mouth daily. 30 tablet 1    FLUoxetine (PROZAC) 20 MG capsule Take 1 capsule (20 mg total) by mouth daily. 30 capsule 1    QUEtiapine (SEROQUEL) 300 MG tablet Take 1 tablet (300 mg total) by mouth at bedtime. 30 tablet 1     Patient Stressors: Financial difficulties   Marital or family conflict   Medication change or noncompliance    Patient Strengths: Motivation for treatment/growth   Treatment Modalities: Medication Management, Group therapy, Case management,  1 to 1 session with clinician, Psychoeducation, Recreational therapy.   Physician Treatment Plan for Primary Diagnosis: Schizoaffective disorder, depressive type (HCC) Long Term Goal(s): Improvement in symptoms so as ready for discharge   Short Term Goals: Compliance with prescribed medications will improve Ability to verbalize feelings will improve Ability to disclose and discuss suicidal ideas Ability to demonstrate self-control will improve  Medication Management: Evaluate patient's response, side effects, and tolerance of medication regimen.  Therapeutic Interventions: 1 to 1 sessions, Unit Group sessions and Medication administration.  Evaluation of Outcomes: Progressing  Physician Treatment Plan for Secondary Diagnosis: Principal Problem:   Schizoaffective disorder, depressive type (HCC) Active Problems:   Cannabis use disorder, moderate, dependence (HCC)   Homelessness  Long Term Goal(s): Improvement in symptoms so as ready for discharge   Short Term Goals: Compliance with prescribed medications will improve Ability to verbalize feelings will improve Ability to disclose and discuss suicidal ideas Ability to demonstrate self-control will improve      Medication Management: Evaluate patient's response, side effects, and tolerance of medication regimen.  Therapeutic Interventions: 1 to 1 sessions, Unit Group sessions and Medication administration.  Evaluation of Outcomes: Progressing   RN Treatment Plan for Primary Diagnosis: Schizoaffective disorder, depressive type (HCC) Long Term Goal(s): Knowledge of disease and therapeutic regimen to maintain health will improve  Short Term Goals: Ability to remain free from injury will improve, Ability to verbalize frustration and anger appropriately will improve, Ability to demonstrate self-control, Ability to participate in decision making will improve, Ability to verbalize feelings will improve, Ability to disclose and discuss suicidal ideas, Ability to identify and develop effective coping behaviors will improve, and Compliance with prescribed medications will improve  Medication Management: RN will administer medications as ordered by provider, will assess and evaluate patient's response and provide education to patient for prescribed medication. RN will report any adverse and/or side effects to prescribing provider.  Therapeutic Interventions: 1 on 1 counseling sessions, Psychoeducation, Medication administration, Evaluate responses to treatment, Monitor vital signs and CBGs as ordered, Perform/monitor CIWA, COWS, AIMS and Fall Risk screenings as ordered, Perform wound care treatments as ordered.  Evaluation of Outcomes: Progressing   LCSW Treatment Plan for Primary Diagnosis: Schizoaffective disorder, depressive type (HCC) Long Term Goal(s): Safe transition to appropriate next level of care at discharge, Engage patient in therapeutic group addressing interpersonal concerns.  Short Term Goals: Engage patient in aftercare planning with referrals and resources, Increase social support, Increase ability to appropriately verbalize feelings, Increase emotional regulation, Facilitate acceptance of  mental health diagnosis and concerns, Facilitate patient progression through stages of change regarding substance use diagnoses and concerns, Identify triggers associated with mental health/substance abuse issues, and Increase skills for wellness and recovery  Therapeutic Interventions: Assess for all discharge needs, 1 to 1 time with Social worker, Explore available resources and support systems, Assess for adequacy in community support network, Educate family and significant other(s) on suicide prevention, Complete Psychosocial Assessment, Interpersonal group therapy.  Evaluation of Outcomes: Progressing   Progress in Treatment: Attending groups: Yes. Participating in groups: Yes. Taking medication as prescribed: Yes. Toleration medication: Yes. Family/Significant other contact made: Yes, individual(s) contacted:  mother, Scarlette Slice. Patient understands diagnosis: Yes. Discussing patient identified problems/goals with staff: Yes. Medical problems stabilized or resolved: Yes. Denies suicidal/homicidal ideation: Yes. Issues/concerns per patient self-inventory: No. Other: none.  New problem(s) identified: No, Describe:  none identified.  New Short Term/Long Term Goal(s): elimination of symptoms of psychosis, medication management for mood stabilization; elimination of SI thoughts; development of comprehensive mental wellness/sobriety plan. Update 12/14/21: No changes at this time. Update 12/21/21: No changes at this time. Update 12/24/21: No changes at this time.   Patient Goals:  "I'm just here to get myself together" Update 12/14/21: No changes at this time. Update 12/21/21: No changes at this time. Update 12/24/21: No changes at this time.   Discharge Plan or Barriers: CSW will continue to meet with the patient and development appropriate  discharge plans for the patient. Update 12/14/21: No changes at this time. Update 12/21/21: No changes at this time. Update 12/24/21: No changes at this  time.   Reason for Continuation of Hospitalization: Aggression Anxiety Depression Hallucinations Medical Issues Medication stabilization Suicidal ideation   Estimated Length of Stay:  TBD  Last 3 [place] Suicide Severity Risk Score: [place] Admission (Current) from 12/07/2021 in [place] ED from 12/03/2021 in [place] DEPT Admission (Discharged) from 11/01/2021 in [facility] High Risk No Risk No Risk       Last PHQ 2/9 Scores:    12/31/2019   10:13 AM  Depression screen PHQ 2/9  Decreased Interest 2  Down, Depressed, Hopeless 1  PHQ - 2 Score 3  Altered sleeping 3  Tired, decreased energy 3  Trouble concentrating 1  Moving slowly or fidgety/restless 2  Suicidal thoughts 2  PHQ-9 Score 14  Difficult doing work/chores Very difficult    Scribe for Treatment Team: Shirl Harris, LCSW 12/24/2021 3:10 PM

## 2021-12-24 NOTE — Progress Notes (Signed)
[facility] MD Progress Note  12/24/2021 4:12 PM Timothy [name]  MRN:  [id number] Subjective: Follow-up patient with schizoaffective depression.  No new complaints.  Mood fairly stable.  Anxious and notices that he is having a bit of a tremor today.  Hardly visible and less he exaggerates it.  Denies any suicidal thought or psychosis Principal Problem: Schizoaffective disorder, depressive type (HCC) Diagnosis: Principal Problem:   Schizoaffective disorder, depressive type (HCC) Active Problems:   Cannabis use disorder, moderate, dependence (HCC)   Homelessness  Total Time spent with patient: 30 minutes  Past Psychiatric History: Past history of schizoaffective disorder  Past Medical History:  Past Medical History:  Diagnosis Date   Cannabis abuse    Paranoid schizophrenia (HCC)    Schizoaffective disorder, depressive type (HCC) 09/17/2014   History reviewed. No pertinent surgical history. Family History:  Family History  Problem Relation Age of Onset   Mental illness Brother    Drug abuse Brother    Mental illness Cousin    Suicidality Cousin    Diabetes Maternal Grandmother    Family Psychiatric  History: See previous Social History:  Social History   Substance and Sexual Activity  Alcohol Use Yes   Alcohol/week: 2.0 standard drinks of alcohol   Types: 2 Cans of beer per week     Social History   Substance and Sexual Activity  Drug Use Yes   Types: Marijuana, MDMA Chiropodist)    Social History   Socioeconomic History   Marital status: Single    Spouse name: Not on file   Number of children: Not on file   Years of education: Not on file   Highest education level: Not on file  Occupational History   Not on file  Tobacco Use   Smoking status: Some Days    Packs/day: 0.25    Types: Cigarettes   Smokeless tobacco: Never  Vaping Use   Vaping Use: Never used  Substance and Sexual Activity   Alcohol use: Yes    Alcohol/week: 2.0 standard drinks of alcohol    Types: 2  Cans of beer per week   Drug use: Yes    Types: Marijuana, MDMA (Ecstacy)   Sexual activity: Yes  Other Topics Concern   Not on file  Social History Narrative   Not on file   Social Determinants of Health   Financial Resource Strain: Low Risk  (12/31/2019)   Overall Financial Resource Strain (CARDIA)    Difficulty of Paying Living Expenses: Not hard at all  Food Insecurity: No Food Insecurity (12/31/2019)   Hunger Vital Sign    Worried About Running Out of Food in the Last Year: Never true    Ran Out of Food in the Last Year: Never true  Transportation Needs: No Transportation Needs (12/31/2019)   PRAPARE - Administrator, Civil Service (Medical): No    Lack of Transportation (Non-Medical): No  Physical Activity: Inactive (12/31/2019)   Exercise Vital Sign    Days of Exercise per Week: 0 days    Minutes of Exercise per Session: 0 min  Stress: Stress Concern Present (12/31/2019)   [employer] of Occupational Health - Occupational Stress Questionnaire    Feeling of Stress : Rather much  Social Connections: Moderately Isolated (12/31/2019)   Social Connection and Isolation Panel [NHANES]    Frequency of Communication with Friends and Family: Once a week    Frequency of Social Gatherings with Friends and Family: Once a week    Attends  Religious Services: More than 4 times per year    Active Member of Clubs or Organizations: Yes    Attends Banker Meetings: More than 4 times per year    Marital Status: Never married   Additional Social History:                         Sleep: Fair  Appetite:  Fair  Current Medications: Current Facility-Administered Medications  Medication Dose Route Frequency Provider Last Rate Last Admin   acetaminophen (TYLENOL) tablet 650 mg  650 mg Oral Q6H PRN Hasheem Voland T, MD   650 mg at 12/24/21 0815   alum & mag hydroxide-simeth (MAALOX/MYLANTA) 200-200-20 MG/5ML suspension 30 mL  30 mL Oral Q4H PRN Alvan Culpepper  T, MD       buPROPion (WELLBUTRIN XL) 24 hr tablet 300 mg  300 mg Oral Daily Daud Cayer T, MD   300 mg at 12/24/21 0815   divalproex (DEPAKOTE) DR tablet 500 mg  500 mg Oral Q12H Rieley Khalsa T, MD   500 mg at 12/24/21 0816   hydrOXYzine (ATARAX) tablet 50 mg  50 mg Oral TID PRN Orton Capell, Jackquline Denmark, MD   50 mg at 12/13/21 0817   ibuprofen (ADVIL) tablet 600 mg  600 mg Oral Q6H PRN Dontrel Smethers T, MD   600 mg at 12/23/21 2107   LORazepam (ATIVAN) tablet 2 mg  2 mg Oral Q4H PRN Dalene Robards T, MD   2 mg at 12/24/21 1351   Or   LORazepam (ATIVAN) injection 2 mg  2 mg Intramuscular Q4H PRN Telisa Ohlsen T, MD       magnesium hydroxide (MILK OF MAGNESIA) suspension 30 mL  30 mL Oral Daily PRN Ellington Greenslade, Jackquline Denmark, MD       neomycin-bacitracin-polymyxin (NEOSPORIN) ointment   Topical PRN Danelle Earthly, Kashif   1 Application at 12/19/21 2112   nicotine (NICODERM CQ - dosed in mg/24 hours) patch 14 mg  14 mg Transdermal Daily Kineta Fudala, Jackquline Denmark, MD   14 mg at 12/24/21 0816   pantoprazole (PROTONIX) EC tablet 40 mg  40 mg Oral Daily Heer Justiss T, MD   40 mg at 12/24/21 0816   QUEtiapine (SEROQUEL) tablet 400 mg  400 mg Oral QHS Malik, Kashif   400 mg at 12/23/21 2106   ziprasidone (GEODON) injection 20 mg  20 mg Intramuscular Q12H PRN Elnita Surprenant, Jackquline Denmark, MD   20 mg at 12/13/21 1336    Lab Results: No results found for this or any previous visit (from the past 48 hour(s)).  Blood Alcohol level:  Lab Results  Component Value Date   ETH <10 12/03/2021   ETH <10 10/31/2021    Metabolic Disorder Labs: Lab Results  Component Value Date   HGBA1C 5.5 11/04/2021   MPG 111.15 11/04/2021   MPG 114 06/17/2021   Lab Results  Component Value Date   PROLACTIN 33.2 (H) 08/15/2016   Lab Results  Component Value Date   CHOL 149 11/04/2021   TRIG 56 11/04/2021   HDL 35 (L) 11/04/2021   CHOLHDL 4.3 11/04/2021   VLDL 11 11/04/2021   LDLCALC 103 (H) 11/04/2021   LDLCALC 111 (H) 06/17/2021    Physical  Findings: AIMS: Facial and Oral Movements Muscles of Facial Expression: None, normal Lips and Perioral Area: None, normal Jaw: None, normal Tongue: None, normal,Extremity Movements Upper (arms, wrists, hands, fingers): None, normal Lower (legs, knees, ankles, toes): None, normal, Trunk Movements  Neck, shoulders, hips: None, normal, Overall Severity Severity of abnormal movements (highest score from questions above): None, normal Incapacitation due to abnormal movements: None, normal Patient's awareness of abnormal movements (rate only patient's report): No Awareness, Dental Status Current problems with teeth and/or dentures?: No Does patient usually wear dentures?: No  CIWA:    COWS:     Musculoskeletal: Strength & Muscle Tone: within normal limits Gait & Station: normal Patient leans: N/A  Psychiatric Specialty Exam:  Presentation  General Appearance: Appropriate for Environment; Casual  Eye Contact:Fair  Speech:Normal Rate  Speech Volume:Decreased  Handedness:Right   Mood and Affect  Mood:Anxious; Depressed  Affect:Constricted   Thought Process  Thought Processes:Coherent  Descriptions of Associations:Intact  Orientation:Full (Time, Place and Person)  Thought Content:Logical  History of Schizophrenia/Schizoaffective disorder:Yes  Duration of Psychotic Symptoms:Greater than six months  Hallucinations:No data recorded Ideas of Reference:None  Suicidal Thoughts:No data recorded Homicidal Thoughts:No data recorded  Sensorium  Memory:Immediate Fair; Remote Good  Judgment:Fair  Insight:Fair   Executive Functions  Concentration:Fair  Attention Span:Fair  Recall:Fair  Fund of Knowledge:Fair  Language:Fair   Psychomotor Activity  Psychomotor Activity:No data recorded  Assets  Assets:Communication Skills; Desire for Improvement; Housing   Sleep  Sleep:No data recorded   Physical Exam: Physical Exam Vitals and nursing note reviewed.   Constitutional:      Appearance: Normal appearance.  HENT:     Head: Normocephalic and atraumatic.     Mouth/Throat:     Pharynx: Oropharynx is clear.  Eyes:     Pupils: Pupils are equal, round, and reactive to light.  Cardiovascular:     Rate and Rhythm: Normal rate and regular rhythm.  Pulmonary:     Effort: Pulmonary effort is normal.     Breath sounds: Normal breath sounds.  Abdominal:     General: Abdomen is flat.     Palpations: Abdomen is soft.  Musculoskeletal:        General: Normal range of motion.  Skin:    General: Skin is warm and dry.  Neurological:     General: No focal deficit present.     Mental Status: He is alert. Mental status is at baseline.  Psychiatric:        Mood and Affect: Mood normal.        Thought Content: Thought content normal.    Review of Systems  Constitutional: Negative.   HENT: Negative.    Eyes: Negative.   Respiratory: Negative.    Cardiovascular: Negative.   Gastrointestinal: Negative.   Musculoskeletal: Negative.   Skin: Negative.   Neurological: Negative.   Psychiatric/Behavioral: Negative.     Blood pressure 114/74, pulse 86, temperature 97.8 F (36.6 C), temperature source Oral, resp. rate 18, height 6\' 3"  (1.905 m), weight 103.9 kg, SpO2 100 %. Body mass index is 28.62 kg/m.   Treatment Plan Summary: Medication management and Plan we can check a Depakote level tomorrow to see if that has anything to do with the tremor although I would be surprised if it does.  More likely just noticing somatic symptoms and I did not really see much of a tremor anyway.  Does not seem to be impairing him in the least.  Encouraged him to continue interacting with staff and helping to work on maybe finding a discharge plan.  [place], MD 12/24/2021, 4:12 PM

## 2021-12-24 NOTE — Progress Notes (Signed)
D: Patient alert and oriented. Patient endorses back pain. PRN tylenol was provided to patient.  Patient denies anxiety. Patient endorses depression. Patient denies HI/AH. Patient endorses SI and visual hallucinations stating he sees spots. Patient contracts for safety. Patient interactive with peers on the unit in the dayroom and engaging in unit activities during shift .  A: Scheduled medications administered to patient, per MD orders.  Support and encouragement provided to patient.  Q15 minute safety checks maintained.   R: Patient compliant with medication administration and treatment plan. No adverse drug reactions noted. Patient remains safe on the unit at this time.

## 2021-12-24 NOTE — Plan of Care (Signed)
  Problem: Activity: Goal: Will verbalize the importance of balancing activity with adequate rest periods Outcome: Progressing   Problem: Education: Goal: Knowledge of the prescribed therapeutic regimen will improve Outcome: Progressing   Problem: Health Behavior/Discharge Planning: Goal: Compliance with prescribed medication regimen will improve Outcome: Progressing   Problem: Nutritional: Goal: Ability to achieve adequate nutritional intake will improve Outcome: Progressing   Problem: Role Relationship: Goal: Ability to communicate needs accurately will improve Outcome: Progressing   Problem: Education: Goal: Knowledge of the prescribed therapeutic regimen will improve Outcome: Progressing   Problem: Health Behavior/Discharge Planning: Goal: Compliance with therapeutic regimen will improve Outcome: Progressing   Problem: Education: Goal: Knowledge of [facility] Education information/materials will improve Outcome: Progressing Goal: Verbalization of understanding the information provided will improve Outcome: Progressing

## 2021-12-24 NOTE — Progress Notes (Signed)
Recreation Therapy Notes   Date: 12/24/2021  Time: 10:10 am    Location: Craft room    Behavioral response: N/A   Intervention Topic: Stress Management    Discussion/Intervention: Patient refused to attend group.   Clinical Observations/Feedback:  Patient refused to attend group.    Tregan Read LRT/CTRS        Neko Mcgeehan 12/24/2021 10:54 AM 

## 2021-12-24 NOTE — Group Note (Signed)
[facility] LCSW Group Therapy Note   Group Date: 12/24/2021 Start Time: 1300 End Time: 1400   Type of Therapy/Topic:  Group Therapy:  Balance in Life  Participation Level:  Active   Description of Group:    This group will address the concept of balance and how it feels and looks when one is unbalanced. Patients will be encouraged to process areas in their lives that are out of balance, and identify reasons for remaining unbalanced. Facilitators will guide patients utilizing problem- solving interventions to address and correct the stressor making their life unbalanced. Understanding and applying boundaries will be explored and addressed for obtaining  and maintaining a balanced life. Patients will be encouraged to explore ways to assertively make their unbalanced needs known to significant others in their lives, using other group members and facilitator for support and feedback.  Therapeutic Goals: Patient will identify two or more emotions or situations they have that consume much of in their lives. Patient will identify signs/triggers that life has become out of balance:  Patient will identify two ways to set boundaries in order to achieve balance in their lives:  Patient will demonstrate ability to communicate their needs through discussion and/or role plays  Summary of Patient Progress: Patient was present in group.  Patient was active and spporitve of others.  He reports that when he thinks of balance he thinks "structure in your life, taking steps, good things".  He reports that triggers to feeling unbalanced have been "isolation". He reports that he "tries not to think about, journal, take walks" to try and circumvent feeling off balanced.   Therapeutic Modalities:   Cognitive Behavioral Therapy Solution-Focused Therapy Assertiveness Training   Harden Mo, LCSW

## 2021-12-24 NOTE — Progress Notes (Signed)
Patient continues to complain of depression, reports he is almost 26 years old and in this place. Denies any Si, Hi, AVH. Only voices complaints of back pain. PRN given with partial relief. Patient noted in dayroom socializing playing cards with peers. Watching television.  Medication compliant. Encouragement and support provided. Safety checks maintained. Medications given as prescribed. Pt receptive and remains safe on unit with q 15 min checks.

## 2021-12-25 DIAGNOSIS — F251 Schizoaffective disorder, depressive type: Secondary | ICD-10-CM | POA: Diagnosis not present

## 2021-12-25 LAB — VALPROIC ACID LEVEL: Valproic Acid Lvl: 68 ug/mL (ref 50.0–100.0)

## 2021-12-25 NOTE — Plan of Care (Signed)
  Problem: Decision Making Goal: STG - Patient will successfully identify 2 ways of making healthy decisions post d/c within 5 recreation therapy group sessions Description: STG - Patient will successfully identify 2 ways of making healthy decisions post d/c within 5 recreation therapy group sessions Outcome: Progressing   

## 2021-12-25 NOTE — Plan of Care (Signed)
  Problem: Education: Goal: Knowledge of the prescribed therapeutic regimen will improve Outcome: Progressing   Problem: Coping: Goal: Coping ability will improve Outcome: Progressing Goal: Will verbalize feelings Outcome: Progressing   Problem: Health Behavior/Discharge Planning: Goal: Compliance with prescribed medication regimen will improve Outcome: Progressing   Problem: Nutritional: Goal: Ability to achieve adequate nutritional intake will improve Outcome: Progressing   Problem: Safety: Goal: Ability to remain free from injury will improve Outcome: Progressing

## 2021-12-25 NOTE — Progress Notes (Signed)
Pt visible on the unit, minimal interaction with peers and staff. Pt has been watching TV, listening to music and coloring. Pt stated he has suicidal thoughts, but no plans. He contracts for safety. Pt denies hearing voices, but states he sees shadows. Anxiety  score 3/10 and depression score 5/10. He reports back pain, score 3/10/achy. Pt does not have much to say, he is very quiet and affect blunted. Pt went to bed around 9 pm.

## 2021-12-26 DIAGNOSIS — F251 Schizoaffective disorder, depressive type: Secondary | ICD-10-CM | POA: Diagnosis not present
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
## 2021-12-27 DIAGNOSIS — F251 Schizoaffective disorder, depressive type: Secondary | ICD-10-CM | POA: Diagnosis not present

## 2021-12-27 NOTE — Progress Notes (Signed)
   12/26/21 2005  Psych Admission Type (Psych Patients Only)  Admission Status Involuntary  Psychosocial Assessment  Patient Complaints Anxiety;Depression;Self-harm thoughts  Eye Contact Fair  Facial Expression Animated  Affect Appropriate to circumstance  Speech Logical/coherent  Interaction Assertive  Motor Activity Slow  Appearance/Hygiene Unremarkable  Behavior Characteristics Cooperative  Mood Pleasant  Thought Process  Coherency WDL  Content WDL  Delusions None reported or observed  Perception WDL;Hallucinations  Hallucination Visual (shadows)  Judgment WDL  Confusion None  Danger to Self  Current suicidal ideation? Passive  Self-Injurious Behavior No self-injurious ideation or behavior indicators observed or expressed   Agreement Not to Harm Self Yes  Description of Agreement verbal  Danger to Others  Danger to Others None reported or observed   Progress note   D: Pt seen in dayroom. Pt denies HI, AH. Pt endorses passive SI but contracts for safety. Pt also endorses VH of shadows. Pt rates pain  9/10 in his legs and back. Pt rates anxiety  8/10 and depression  10/10. Pt states that his medication helps with being able to tolerate his hallucinations. "They have been there a long time. It's just frustrating that they are still there." Medication doesn't take them away completely. Pt says he likes to listen to music to stay calm. He likes male rap artists. The group was listening to music outside. Pt states he is homeless but wants to get an apartment. "I'm going to do it myself this time. If I can get a car then I can get a job too." Pt denies any family support. Doesn't know which city he will settle in - has been to [place], [place], [place] and [place]. Pt endorses good sleep and appetite. Attends groups. Pt also endorses tremors in his hands that he says just started recently.   A: Pt provided support and encouragement. Pt given scheduled medication as  prescribed. PRNs as appropriate. Q15 min checks for safety.   R: Pt safe on the unit. Will continue to monitor.

## 2021-12-27 NOTE — Group Note (Addendum)
LCSW Group Therapy Note  Group Date: 12/27/2021 Start Time: 1300 End Time: 1400   Type of Therapy and Topic:  Group Therapy - How To Cope with Nervousness about Discharge   Participation Level:  Minimal   Description of Group This process group involved identification of patients' feelings about discharge. Some of them are scheduled to be discharged soon, while others are new admissions, but each of them was asked to share thoughts and feelings surrounding discharge from the hospital. One common theme was that they are excited at the prospect of going home, while another was that many of them are apprehensive about sharing why they were hospitalized. Patients were given the opportunity to discuss these feelings with their peers in preparation for discharge.  Therapeutic Goals  Patient will identify their overall feelings about pending discharge. Patient will think about how they might proactively address issues that they believe will once again arise once they get home/in the communty. Patients will participate in discussion about having hope for change.   Summary of Patient Progress:  He was minimally involved throughout the session. Pt identified moving to a new place and trying to get back his independence/stability back as issues that he needs to address on the outside. Pt demonstrated slight insight into the subject matter, and proved open to input from peers and feedback from CSW. He was respectful of peers and participated throughout the entire session.   Therapeutic Modalities Cognitive Behavioral Therapy   [name], LCSW 12/27/2021  2:46 PM

## 2021-12-28 DIAGNOSIS — F251 Schizoaffective disorder, depressive type: Secondary | ICD-10-CM | POA: Diagnosis not present

## 2021-12-28 NOTE — Plan of Care (Signed)
Problem: Activity: Goal: Will verbalize the importance of balancing activity with adequate rest periods Outcome: Progressing   Problem: Education: Goal: Will be free of psychotic symptoms Outcome: Progressing Goal: Knowledge of the prescribed therapeutic regimen will improve Outcome: Progressing   Problem: Coping: Goal: Coping ability will improve Outcome: Progressing Goal: Will verbalize feelings Outcome: Progressing   Problem: Health Behavior/Discharge Planning: Goal: Compliance with prescribed medication regimen will improve Outcome: Progressing   Problem: Nutritional: Goal: Ability to achieve adequate nutritional intake will improve Outcome: Progressing   Problem: Role Relationship: Goal: Ability to communicate needs accurately will improve Outcome: Progressing Goal: Ability to interact with others will improve Outcome: Progressing   Problem: Safety: Goal: Ability to redirect hostility and anger into socially appropriate behaviors will improve Outcome: Progressing Goal: Ability to remain free from injury will improve Outcome: Progressing   Problem: Self-Care: Goal: Ability to participate in self-care as condition permits will improve Outcome: Progressing   Problem: Self-Concept: Goal: Will verbalize positive feelings about self Outcome: Progressing   Problem: Education: Goal: Utilization of techniques to improve thought processes will improve Outcome: Progressing Goal: Knowledge of the prescribed therapeutic regimen will improve Outcome: Progressing   Problem: Activity: Goal: Interest or engagement in leisure activities will improve Outcome: Progressing Goal: Imbalance in normal sleep/wake cycle will improve Outcome: Progressing   Problem: Coping: Goal: Coping ability will improve Outcome: Progressing Goal: Will verbalize feelings Outcome: Progressing   Problem: Health Behavior/Discharge Planning: Goal: Ability to make decisions will  improve Outcome: Progressing Goal: Compliance with therapeutic regimen will improve Outcome: Progressing   Problem: Role Relationship: Goal: Will demonstrate positive changes in social behaviors and relationships Outcome: Progressing   Problem: Safety: Goal: Ability to disclose and discuss suicidal ideas will improve Outcome: Progressing Goal: Ability to identify and utilize support systems that promote safety will improve Outcome: Progressing   Problem: Self-Concept: Goal: Will verbalize positive feelings about self Outcome: Progressing Goal: Level of anxiety will decrease Outcome: Progressing   Problem: Education: Goal: Knowledge of [place] [facility] Education information/materials will improve Outcome: Progressing Goal: Emotional status will improve Outcome: Progressing Goal: Mental status will improve Outcome: Progressing Goal: Verbalization of understanding the information provided will improve Outcome: Progressing   Problem: Activity: Goal: Interest or engagement in activities will improve Outcome: Progressing Goal: Sleeping patterns will improve Outcome: Progressing   Problem: Coping: Goal: Ability to verbalize frustrations and anger appropriately will improve Outcome: Progressing Goal: Ability to demonstrate self-control will improve Outcome: Progressing   Problem: Health Behavior/Discharge Planning: Goal: Identification of resources available to assist in meeting health care needs will improve Outcome: Progressing Goal: Compliance with treatment plan for underlying cause of condition will improve Outcome: Progressing   Problem: Physical Regulation: Goal: Ability to maintain clinical measurements within normal limits will improve Outcome: Progressing   Problem: Safety: Goal: Periods of time without injury will increase Outcome: Progressing   Problem: Education: Goal: Knowledge of General Education information will improve Description: Including  pain rating scale, medication(s)/side effects and non-pharmacologic comfort measures Outcome: Progressing   Problem: Health Behavior/Discharge Planning: Goal: Ability to manage health-related needs will improve Outcome: Progressing   Problem: Clinical Measurements: Goal: Ability to maintain clinical measurements within normal limits will improve Outcome: Progressing Goal: Will remain free from infection Outcome: Progressing Goal: Diagnostic test results will improve Outcome: Progressing Goal: Respiratory complications will improve Outcome: Progressing Goal: Cardiovascular complication will be avoided Outcome: Progressing   Problem: Activity: Goal: Risk for activity intolerance will decrease Outcome: Progressing   Problem: Nutrition: Goal:   Adequate nutrition will be maintained Outcome: Progressing   Problem: Coping: Goal: Level of anxiety will decrease Outcome: Progressing

## 2021-12-28 NOTE — Progress Notes (Signed)
Pt visible on the unit, minimal interaction with peers and staff. He reports passive SI, denies HI. He sees shadow and auditory hallucinations. He has been up this evening watch TV and talking to his peers. He reports his anxiety and depression 10/10, but he does not state why.

## 2021-12-28 NOTE — Plan of Care (Signed)
Pt rates depression and anxiety both 9/10. Pt endorses AH but denies VH. Pt has SI but verbally contracts for safety. Pt denies Hi. Pt was educated on care plan and verbalizes understanding. Timothy Mayers RN Problem: Activity: Goal: Will verbalize the importance of balancing activity with adequate rest periods Outcome: Progressing   Problem: Education: Goal: Will be free of psychotic symptoms Outcome: Progressing Goal: Knowledge of the prescribed therapeutic regimen will improve Outcome: Progressing   Problem: Coping: Goal: Coping ability will improve Outcome: Progressing Goal: Will verbalize feelings Outcome: Progressing   Problem: Health Behavior/Discharge Planning: Goal: Compliance with prescribed medication regimen will improve Outcome: Progressing   Problem: Nutritional: Goal: Ability to achieve adequate nutritional intake will improve Outcome: Progressing   Problem: Role Relationship: Goal: Ability to communicate needs accurately will improve Outcome: Progressing Goal: Ability to interact with others will improve Outcome: Progressing   Problem: Safety: Goal: Ability to redirect hostility and anger into socially appropriate behaviors will improve Outcome: Progressing Goal: Ability to remain free from injury will improve Outcome: Progressing   Problem: Self-Care: Goal: Ability to participate in self-care as condition permits will improve Outcome: Progressing   Problem: Self-Concept: Goal: Will verbalize positive feelings about self Outcome: Progressing   Problem: Education: Goal: Utilization of techniques to improve thought processes will improve Outcome: Progressing Goal: Knowledge of the prescribed therapeutic regimen will improve Outcome: Progressing   Problem: Activity: Goal: Interest or engagement in leisure activities will improve Outcome: Progressing Goal: Imbalance in normal sleep/wake cycle will improve Outcome: Progressing   Problem:  Coping: Goal: Coping ability will improve Outcome: Progressing Goal: Will verbalize feelings Outcome: Progressing   Problem: Health Behavior/Discharge Planning: Goal: Ability to make decisions will improve Outcome: Progressing Goal: Compliance with therapeutic regimen will improve Outcome: Progressing   Problem: Role Relationship: Goal: Will demonstrate positive changes in social behaviors and relationships Outcome: Progressing   Problem: Safety: Goal: Ability to disclose and discuss suicidal ideas will improve Outcome: Progressing Goal: Ability to identify and utilize support systems that promote safety will improve Outcome: Progressing   Problem: Self-Concept: Goal: Will verbalize positive feelings about self Outcome: Progressing Goal: Level of anxiety will decrease Outcome: Progressing   Problem: Education: Goal: Knowledge of [facility] Education information/materials will improve Outcome: Progressing Goal: Emotional status will improve Outcome: Progressing Goal: Mental status will improve Outcome: Progressing Goal: Verbalization of understanding the information provided will improve Outcome: Progressing   Problem: Activity: Goal: Interest or engagement in activities will improve Outcome: Progressing Goal: Sleeping patterns will improve Outcome: Progressing   Problem: Coping: Goal: Ability to verbalize frustrations and anger appropriately will improve Outcome: Progressing Goal: Ability to demonstrate self-control will improve Outcome: Progressing   Problem: Health Behavior/Discharge Planning: Goal: Identification of resources available to assist in meeting health care needs will improve Outcome: Progressing Goal: Compliance with treatment plan for underlying cause of condition will improve Outcome: Progressing   Problem: Physical Regulation: Goal: Ability to maintain clinical measurements within normal limits will improve Outcome: Progressing    Problem: Safety: Goal: Periods of time without injury will increase Outcome: Progressing   Problem: Education: Goal: Knowledge of General Education information will improve Description: Including pain rating scale, medication(s)/side effects and non-pharmacologic comfort measures Outcome: Progressing   Problem: Health Behavior/Discharge Planning: Goal: Ability to manage health-related needs will improve Outcome: Progressing   Problem: Clinical Measurements: Goal: Ability to maintain clinical measurements within normal limits will improve Outcome: Progressing Goal: Will remain free from infection Outcome: Progressing Goal: Diagnostic test results will  improve Outcome: Progressing Goal: Respiratory complications will improve Outcome: Progressing Goal: Cardiovascular complication will be avoided Outcome: Progressing   Problem: Activity: Goal: Risk for activity intolerance will decrease Outcome: Progressing   Problem: Nutrition: Goal: Adequate nutrition will be maintained Outcome: Progressing   Problem: Coping: Goal: Level of anxiety will decrease Outcome: Progressing

## 2021-12-29 DIAGNOSIS — F251 Schizoaffective disorder, depressive type: Secondary | ICD-10-CM | POA: Diagnosis not present

## 2021-12-29 NOTE — Group Note (Signed)
[facility] LCSW Group Therapy Note   Group Date: 12/29/2021 Start Time: 1300 End Time: 1400   Type of Therapy/Topic:  Group Therapy:  Balance in Life  Participation Level:  Did Not Attend   Description of Group:    This group will address the concept of balance and how it feels and looks when one is unbalanced. Patients will be encouraged to process areas in their lives that are out of balance, and identify reasons for remaining unbalanced. Facilitators will guide patients utilizing problem- solving interventions to address and correct the stressor making their life unbalanced. Understanding and applying boundaries will be explored and addressed for obtaining  and maintaining a balanced life. Patients will be encouraged to explore ways to assertively make their unbalanced needs known to significant others in their lives, using other group members and facilitator for support and feedback.  Therapeutic Goals: Patient will identify two or more emotions or situations they have that consume much of in their lives. Patient will identify signs/triggers that life has become out of balance:  Patient will identify two ways to set boundaries in order to achieve balance in their lives:  Patient will demonstrate ability to communicate their needs through discussion and/or role plays  Summary of Patient Progress: X   Therapeutic Modalities:   Cognitive Behavioral Therapy Solution-Focused Therapy Assertiveness Training   [name], LCSW

## 2021-12-29 NOTE — BH IP Treatment Plan (Signed)
Interdisciplinary Treatment and Diagnostic Plan Update  12/29/2021 Time of Session: 8:30 AM Timothy [name] MRN: [id number]  Principal Diagnosis: Schizoaffective disorder, depressive type (HCC)  Secondary Diagnoses: Principal Problem:   Schizoaffective disorder, depressive type (HCC) Active Problems:   Cannabis use disorder, moderate, dependence (HCC)   Homelessness   Current Medications:  Current Facility-Administered Medications  Medication Dose Route Frequency Provider Last Rate Last Admin   acetaminophen (TYLENOL) tablet 650 mg  650 mg Oral Q6H PRN Clapacs, John T, MD   650 mg at 12/27/21 0824   alum & mag hydroxide-simeth (MAALOX/MYLANTA) 200-200-20 MG/5ML suspension 30 mL  30 mL Oral Q4H PRN Clapacs, John T, MD       buPROPion (WELLBUTRIN XL) 24 hr tablet 300 mg  300 mg Oral Daily Clapacs, John T, MD   300 mg at 12/29/21 0827   divalproex (DEPAKOTE) DR tablet 500 mg  500 mg Oral Q12H Clapacs, John T, MD   500 mg at 12/29/21 7829   hydrOXYzine (ATARAX) tablet 50 mg  50 mg Oral TID PRN Clapacs, Jackquline Denmark, MD   50 mg at 12/13/21 0817   ibuprofen (ADVIL) tablet 600 mg  600 mg Oral Q6H PRN Clapacs, John T, MD   600 mg at 12/28/21 2115   LORazepam (ATIVAN) tablet 2 mg  2 mg Oral Q4H PRN Clapacs, John T, MD   2 mg at 12/25/21 5621   Or   LORazepam (ATIVAN) injection 2 mg  2 mg Intramuscular Q4H PRN Clapacs, John T, MD       magnesium hydroxide (MILK OF MAGNESIA) suspension 30 mL  30 mL Oral Daily PRN Clapacs, Jackquline Denmark, MD       neomycin-bacitracin-polymyxin (NEOSPORIN) ointment   Topical PRN Danelle Earthly, Kashif   1 Application at 12/19/21 2112   nicotine (NICODERM CQ - dosed in mg/24 hours) patch 14 mg  14 mg Transdermal Daily Clapacs, John T, MD   14 mg at 12/29/21 0827   pantoprazole (PROTONIX) EC tablet 40 mg  40 mg Oral Daily Clapacs, John T, MD   40 mg at 12/29/21 0828   QUEtiapine (SEROQUEL) tablet 400 mg  400 mg Oral QHS Malik, Kashif   400 mg at 12/28/21 2107   ziprasidone (GEODON)  injection 20 mg  20 mg Intramuscular Q12H PRN Clapacs, John T, MD   20 mg at 12/13/21 1336   PTA Medications: Medications Prior to Admission  Medication Sig Dispense Refill Last Dose   buPROPion (WELLBUTRIN XL) 300 MG 24 hr tablet Take 1 tablet (300 mg total) by mouth daily. 30 tablet 1    FLUoxetine (PROZAC) 20 MG capsule Take 1 capsule (20 mg total) by mouth daily. 30 capsule 1    QUEtiapine (SEROQUEL) 300 MG tablet Take 1 tablet (300 mg total) by mouth at bedtime. 30 tablet 1     Patient Stressors: Financial difficulties   Marital or family conflict   Medication change or noncompliance    Patient Strengths: Motivation for treatment/growth   Treatment Modalities: Medication Management, Group therapy, Case management,  1 to 1 session with clinician, Psychoeducation, Recreational therapy.   Physician Treatment Plan for Primary Diagnosis: Schizoaffective disorder, depressive type (HCC) Long Term Goal(s): Improvement in symptoms so as ready for discharge   Short Term Goals: Compliance with prescribed medications will improve Ability to verbalize feelings will improve Ability to disclose and discuss suicidal ideas Ability to demonstrate self-control will improve  Medication Management: Evaluate patient's response, side effects, and tolerance of medication regimen.  Therapeutic Interventions: 1 to 1 sessions, Unit Group sessions and Medication administration.  Evaluation of Outcomes: Progressing  Physician Treatment Plan for Secondary Diagnosis: Principal Problem:   Schizoaffective disorder, depressive type (HCC) Active Problems:   Cannabis use disorder, moderate, dependence (HCC)   Homelessness  Long Term Goal(s): Improvement in symptoms so as ready for discharge   Short Term Goals: Compliance with prescribed medications will improve Ability to verbalize feelings will improve Ability to disclose and discuss suicidal ideas Ability to demonstrate self-control will improve      Medication Management: Evaluate patient's response, side effects, and tolerance of medication regimen.  Therapeutic Interventions: 1 to 1 sessions, Unit Group sessions and Medication administration.  Evaluation of Outcomes: Progressing   RN Treatment Plan for Primary Diagnosis: Schizoaffective disorder, depressive type (HCC) Long Term Goal(s): Knowledge of disease and therapeutic regimen to maintain health will improve  Short Term Goals: Ability to remain free from injury will improve, Ability to verbalize frustration and anger appropriately will improve, Ability to demonstrate self-control, Ability to participate in decision making will improve, Ability to verbalize feelings will improve, Ability to disclose and discuss suicidal ideas, Ability to identify and develop effective coping behaviors will improve, and Compliance with prescribed medications will improve  Medication Management: RN will administer medications as ordered by provider, will assess and evaluate patient's response and provide education to patient for prescribed medication. RN will report any adverse and/or side effects to prescribing provider.  Therapeutic Interventions: 1 on 1 counseling sessions, Psychoeducation, Medication administration, Evaluate responses to treatment, Monitor vital signs and CBGs as ordered, Perform/monitor CIWA, COWS, AIMS and Fall Risk screenings as ordered, Perform wound care treatments as ordered.  Evaluation of Outcomes: Progressing   LCSW Treatment Plan for Primary Diagnosis: Schizoaffective disorder, depressive type (HCC) Long Term Goal(s): Safe transition to appropriate next level of care at discharge, Engage patient in therapeutic group addressing interpersonal concerns.  Short Term Goals: Engage patient in aftercare planning with referrals and resources, Increase social support, Increase ability to appropriately verbalize feelings, Increase emotional regulation, Facilitate acceptance of  mental health diagnosis and concerns, Facilitate patient progression through stages of change regarding substance use diagnoses and concerns, Identify triggers associated with mental health/substance abuse issues, and Increase skills for wellness and recovery  Therapeutic Interventions: Assess for all discharge needs, 1 to 1 time with Social worker, Explore available resources and support systems, Assess for adequacy in community support network, Educate family and significant other(s) on suicide prevention, Complete Psychosocial Assessment, Interpersonal group therapy.  Evaluation of Outcomes: Progressing   Progress in Treatment: Attending groups: Yes. Participating in groups: Yes. Taking medication as prescribed: Yes. Toleration medication: Yes. Family/Significant other contact made: Yes, individual(s) contacted:  mother, Scarlette Slice. Patient understands diagnosis: Yes. Discussing patient identified problems/goals with staff: Yes. Medical problems stabilized or resolved: Yes. Denies suicidal/homicidal ideation: Yes. Issues/concerns per patient self-inventory: No. Other: none.  New problem(s) identified: No, Describe:  none identified.  New Short Term/Long Term Goal(s): elimination of symptoms of psychosis, medication management for mood stabilization; elimination of SI thoughts; development of comprehensive mental wellness/sobriety plan. Update 12/14/21: No changes at this time. Update 12/21/21: No changes at this time. Update 12/24/21: No changes at this time. Update 12/29/21: No changes at this time.    Patient Goals:  "I'm just here to get myself together" Update 12/14/21: No changes at this time. Update 12/21/21: No changes at this time. Update 12/24/21: No changes at this time. Update 12/29/21: No changes at this time.  Discharge Plan or Barriers: CSW will continue to meet with the patient and development appropriate discharge plans for the patient. Update 12/14/21: No changes at this  time. Update 12/21/21: No changes at this time. Update 12/24/21: No changes at this time. Update 12/29/21:  No changes at this time.    Reason for Continuation of Hospitalization: Aggression Anxiety Depression Hallucinations Medical Issues Medication stabilization Suicidal ideation   Estimated Length of Stay:  TBD  Last 3 [place] Suicide Severity Risk Score: Flowsheet Row Admission (Current) from 12/07/2021 in [facility]PATIENT BE[facility]ICINE ED from 12/03/2021 in [facility] Admission (Discharged) from 11/01/2021 in [facility] INPATIENT BEH[facility]AL MEDICINE  C-SSRS RISK CATEGORY High Risk No Risk No Risk       Last PHQ 2/9 Scores:    12/31/2019   10:13 AM  Depression screen PHQ 2/9  Decreased Interest 2  Down, Depressed, Hopeless 1  PHQ - 2 Score 3  Altered sleeping 3  Tired, decreased energy 3  Trouble concentrating 1  Moving slowly or fidgety/restless 2  Suicidal thoughts 2  PHQ-9 Score 14  Difficult doing work/chores Very difficult    Scribe for Treatment Team: [name], LCSW 12/29/2021 9:00 AM

## 2021-12-30 DIAGNOSIS — F251 Schizoaffective disorder, depressive type: Secondary | ICD-10-CM | POA: Diagnosis not present

## 2021-12-30 NOTE — [facility] Counselor (Signed)
CSW spoke with [place] to follow up on patients interview the day before. CSW was informed that Abby, who conducted the interview was not present and CSW was able to leave a HIPAA compliant voicemail.  Penni Homans, MSW, LCSW 12/30/2021 3:57 PM

## 2021-12-30 NOTE — Progress Notes (Signed)
[facility] MD Progress Note  12/30/2021 5:03 PM Timothy [name]  MRN:  [id number] Subjective: Follow-up patient with schizoaffective disorder.  Patient says he is hearing voices again.  He is certainly pacing more around the unit.  Passive suicidal thoughts no intent or plan.  Overall affect looks calm.  He has been making some effort to work with [place] on discharge planning Principal Problem: Schizoaffective disorder, depressive type (HCC) Diagnosis: Principal Problem:   Schizoaffective disorder, depressive type (HCC) Active Problems:   Cannabis use disorder, moderate, dependence (HCC)   Homelessness  Total Time spent with patient: 30 minutes  Past Psychiatric History: Past history of schizoaffective disorder  Past Medical History:  Past Medical History:  Diagnosis Date   Cannabis abuse    Paranoid schizophrenia (HCC)    Schizoaffective disorder, depressive type (HCC) 09/17/2014   History reviewed. No pertinent surgical history. Family History:  Family History  Problem Relation Age of Onset   Mental illness Brother    Drug abuse Brother    Mental illness Cousin    Suicidality Cousin    Diabetes Maternal Grandmother    Family Psychiatric  History: See previous Social History:  Social History   Substance and Sexual Activity  Alcohol Use Yes   Alcohol/week: 2.0 standard drinks of alcohol   Types: 2 Cans of beer per week     Social History   Substance and Sexual Activity  Drug Use Yes   Types: Marijuana, MDMA Chiropodist)    Social History   Socioeconomic History   Marital status: Single    Spouse name: Not on file   Number of children: Not on file   Years of education: Not on file   Highest education level: Not on file  Occupational History   Not on file  Tobacco Use   Smoking status: Some Days    Packs/day: 0.25    Types: Cigarettes   Smokeless tobacco: Never  Vaping Use   Vaping Use: Never used  Substance and Sexual Activity   Alcohol use: Yes    Alcohol/week: 2.0  standard drinks of alcohol    Types: 2 Cans of beer per week   Drug use: Yes    Types: Marijuana, MDMA (Ecstacy)   Sexual activity: Yes  Other Topics Concern   Not on file  Social History Narrative   Not on file   Social Determinants of Health   Financial Resource Strain: Low Risk  (12/31/2019)   Overall Financial Resource Strain (CARDIA)    Difficulty of Paying Living Expenses: Not hard at all  Food Insecurity: No Food Insecurity (12/31/2019)   Hunger Vital Sign    Worried About Running Out of Food in the Last Year: Never true    Ran Out of Food in the Last Year: Never true  Transportation Needs: No Transportation Needs (12/31/2019)   PRAPARE - Administrator, Civil Service (Medical): No    Lack of Transportation (Non-Medical): No  Physical Activity: Inactive (12/31/2019)   Exercise Vital Sign    Days of Exercise per Week: 0 days    Minutes of Exercise per Session: 0 min  Stress: Stress Concern Present (12/31/2019)   [employer] of Occupational Health - Occupational Stress Questionnaire    Feeling of Stress : Rather much  Social Connections: Moderately Isolated (12/31/2019)   Social Connection and Isolation Panel [NHANES]    Frequency of Communication with Friends and Family: Once a week    Frequency of Social Gatherings with Friends and Family:  Once a week    Attends Religious Services: More than 4 times per year    Active Member of Clubs or Organizations: Yes    Attends Engineer, structural: More than 4 times per year    Marital Status: Never married   Additional Social History:                         Sleep: Fair  Appetite:  Fair  Current Medications: Current Facility-Administered Medications  Medication Dose Route Frequency Provider Last Rate Last Admin   acetaminophen (TYLENOL) tablet 650 mg  650 mg Oral Q6H PRN Shyteria Lewis T, MD   650 mg at 12/27/21 0824   alum & mag hydroxide-simeth (MAALOX/MYLANTA) 200-200-20 MG/5ML suspension  30 mL  30 mL Oral Q4H PRN Bohdi Leeds T, MD       buPROPion (WELLBUTRIN XL) 24 hr tablet 300 mg  300 mg Oral Daily Simone Tuckey T, MD   300 mg at 12/30/21 0747   divalproex (DEPAKOTE) DR tablet 500 mg  500 mg Oral Q12H Elin Fenley T, MD   500 mg at 12/30/21 0747   hydrOXYzine (ATARAX) tablet 50 mg  50 mg Oral TID PRN Tandy Grawe, Jackquline Denmark, MD   50 mg at 12/13/21 0817   ibuprofen (ADVIL) tablet 600 mg  600 mg Oral Q6H PRN Sadat Sliwa T, MD   600 mg at 12/28/21 2115   LORazepam (ATIVAN) tablet 2 mg  2 mg Oral Q4H PRN Isaic Syler T, MD   2 mg at 12/25/21 0998   Or   LORazepam (ATIVAN) injection 2 mg  2 mg Intramuscular Q4H PRN Kiowa Hollar T, MD       magnesium hydroxide (MILK OF MAGNESIA) suspension 30 mL  30 mL Oral Daily PRN Nahal Wanless, Jackquline Denmark, MD       neomycin-bacitracin-polymyxin (NEOSPORIN) ointment   Topical PRN Danelle Earthly, Kashif   1 Application at 12/19/21 2112   nicotine (NICODERM CQ - dosed in mg/24 hours) patch 14 mg  14 mg Transdermal Daily Miyako Oelke T, MD   14 mg at 12/30/21 0750   pantoprazole (PROTONIX) EC tablet 40 mg  40 mg Oral Daily Seth Higginbotham T, MD   40 mg at 12/30/21 0748   QUEtiapine (SEROQUEL) tablet 400 mg  400 mg Oral QHS Malik, Kashif   400 mg at 12/29/21 2111   ziprasidone (GEODON) injection 20 mg  20 mg Intramuscular Q12H PRN Jas Betten, Jackquline Denmark, MD   20 mg at 12/13/21 1336    Lab Results: No results found for this or any previous visit (from the past 48 hour(s)).  Blood Alcohol level:  Lab Results  Component Value Date   ETH <10 12/03/2021   ETH <10 10/31/2021    Metabolic Disorder Labs: Lab Results  Component Value Date   HGBA1C 5.5 11/04/2021   MPG 111.15 11/04/2021   MPG 114 06/17/2021   Lab Results  Component Value Date   PROLACTIN 33.2 (H) 08/15/2016   Lab Results  Component Value Date   CHOL 149 11/04/2021   TRIG 56 11/04/2021   HDL 35 (L) 11/04/2021   CHOLHDL 4.3 11/04/2021   VLDL 11 11/04/2021   LDLCALC 103 (H) 11/04/2021   LDLCALC 111  (H) 06/17/2021    Physical Findings: AIMS: Facial and Oral Movements Muscles of Facial Expression: None, normal Lips and Perioral Area: None, normal Jaw: None, normal Tongue: None, normal,Extremity Movements Upper (arms, wrists, hands, fingers): None, normal Lower (legs,  knees, ankles, toes): None, normal, Trunk Movements Neck, shoulders, hips: None, normal, Overall Severity Severity of abnormal movements (highest score from questions above): None, normal Incapacitation due to abnormal movements: None, normal Patient's awareness of abnormal movements (rate only patient's report): No Awareness, Dental Status Current problems with teeth and/or dentures?: No Does patient usually wear dentures?: No  CIWA:    COWS:     Musculoskeletal: Strength & Muscle Tone: within normal limits Gait & Station: normal Patient leans: N/A  Psychiatric Specialty Exam:  Presentation  General Appearance: Appropriate for Environment; Casual  Eye Contact:Fair  Speech:Normal Rate  Speech Volume:Decreased  Handedness:Right   Mood and Affect  Mood:Anxious; Depressed  Affect:Constricted   Thought Process  Thought Processes:Coherent  Descriptions of Associations:Intact  Orientation:Full (Time, Place and Person)  Thought Content:Logical  History of Schizophrenia/Schizoaffective disorder:Yes  Duration of Psychotic Symptoms:Greater than six months  Hallucinations:No data recorded Ideas of Reference:None  Suicidal Thoughts:No data recorded Homicidal Thoughts:No data recorded  Sensorium  Memory:Immediate Fair; Remote Good  Judgment:Fair  Insight:Fair   Executive Functions  Concentration:Fair  Attention Span:Fair  Recall:Fair  Fund of Knowledge:Fair  Language:Fair   Psychomotor Activity  Psychomotor Activity:No data recorded  Assets  Assets:Communication Skills; Desire for Improvement; Housing   Sleep  Sleep:No data recorded   Physical Exam: Physical  Exam Vitals and nursing note reviewed.  Constitutional:      Appearance: Normal appearance.  HENT:     Head: Normocephalic and atraumatic.     Mouth/Throat:     Pharynx: Oropharynx is clear.  Eyes:     Pupils: Pupils are equal, round, and reactive to light.  Cardiovascular:     Rate and Rhythm: Normal rate and regular rhythm.  Pulmonary:     Effort: Pulmonary effort is normal.     Breath sounds: Normal breath sounds.  Abdominal:     General: Abdomen is flat.     Palpations: Abdomen is soft.  Musculoskeletal:        General: Normal range of motion.  Skin:    General: Skin is warm and dry.  Neurological:     General: No focal deficit present.     Mental Status: He is alert. Mental status is at baseline.  Psychiatric:        Attention and Perception: He perceives auditory hallucinations.        Mood and Affect: Mood normal.        Thought Content: Thought content normal.    Review of Systems  Constitutional: Negative.   HENT: Negative.    Eyes: Negative.   Respiratory: Negative.    Cardiovascular: Negative.   Gastrointestinal: Negative.   Musculoskeletal: Negative.   Skin: Negative.   Neurological: Negative.   Psychiatric/Behavioral:  Positive for hallucinations. The patient is nervous/anxious.    Blood pressure 122/67, pulse 79, temperature 98 F (36.7 C), temperature source Oral, resp. rate 18, height 6\' 3"  (1.905 m), weight 103.9 kg, SpO2 100 %. Body mass index is 28.62 kg/m.   Treatment Plan Summary: Medication management and Plan no change to medication management.  Treatment team continues to try and find creative options for discharge planning.  No change to medicine lots of supportive counseling and encouragement.  , MD 12/30/2021, 5:03 PM

## 2021-12-30 NOTE — Progress Notes (Signed)
Pt has been calm, cooperative, med compliant and social. He went to a group and went out to the courtyard. He has been med compliant and has a good appetite. He is pleasant. Torrie Mayers RN

## 2021-12-30 NOTE — Group Note (Signed)
[facility] LCSW Group Therapy Note   Group Date: 12/30/2021 Start Time: 1300 End Time: 1400   Type of Therapy/Topic:  Group Therapy:  Emotion Regulation  Participation Level:  Active   Mood:  Description of Group:    The purpose of this group is to assist patients in learning to regulate negative emotions and experience positive emotions. Patients will be guided to discuss ways in which they have been vulnerable to their negative emotions. These vulnerabilities will be juxtaposed with experiences of positive emotions or situations, and patients challenged to use positive emotions to combat negative ones. Special emphasis will be placed on coping with negative emotions in conflict situations, and patients will process healthy conflict resolution skills.  Therapeutic Goals: Patient will identify two positive emotions or experiences to reflect on in order to balance out negative emotions:  Patient will label two or more emotions that they find the most difficult to experience:  Patient will be able to demonstrate positive conflict resolution skills through discussion or role plays:   Summary of Patient Progress:   Patient was an active participant in gorup and supportive of other group members.  Patient shared that when he thinks of "emotion regulation" he thinks of "feeling down mentally".  He reports that prior to hospitalization he was hopeful "I would feel better about myself".  He reports that he know that he is experiencing negative emotions "because I act shy or talk a lot".  He reports that recent example of responding to a negative emotion negatively was "by crying myself to sleep".  Patient was receptive as CSW spoke with patient that crying was not emasculating as patient stated.  CSW provided support.   Therapeutic Modalities:   Cognitive Behavioral Therapy Feelings Identification Dialectical Behavioral Therapy   Harden Mo, LCSW

## 2021-12-30 NOTE — Progress Notes (Signed)
Timothy [name] is in a pleasant mood. He has been active on the unit since the beginning of the shift hanging out in the dayroom and watching tv and engaging with his peers.  He is med compliant this evening and received his meds without issue.  He denies hi  vh and pain. He does endorse passive si and audio hallucinations. Reports hearing things but states that are whispers that he cant discern. He states that overall his symptoms are manageable.  He reports talking to staff about the  [facility] and is excited about the possibility to be able to attend.  States it gives him hope and something to look forward to.  Support and encouragement offered. Will continue to monitor with q15 minute safety checks.    C butler-Nicholson, LPN

## 2021-12-30 NOTE — Plan of Care (Signed)
Pt rates depression and anxiety 10/10. Pt has SI but verbally contracts for safety. Pt endorses AH but denies VH and HI. Pt was educated on care plan and verbalizes understanding. Torrie Mayers RN Problem: Activity: Goal: Will verbalize the importance of balancing activity with adequate rest periods Outcome: Progressing   Problem: Education: Goal: Will be free of psychotic symptoms Outcome: Progressing Goal: Knowledge of the prescribed therapeutic regimen will improve Outcome: Progressing   Problem: Coping: Goal: Coping ability will improve Outcome: Progressing Goal: Will verbalize feelings Outcome: Progressing   Problem: Health Behavior/Discharge Planning: Goal: Compliance with prescribed medication regimen will improve Outcome: Progressing   Problem: Nutritional: Goal: Ability to achieve adequate nutritional intake will improve Outcome: Progressing   Problem: Role Relationship: Goal: Ability to communicate needs accurately will improve Outcome: Progressing Goal: Ability to interact with others will improve Outcome: Progressing   Problem: Safety: Goal: Ability to redirect hostility and anger into socially appropriate behaviors will improve Outcome: Progressing Goal: Ability to remain free from injury will improve Outcome: Progressing   Problem: Self-Care: Goal: Ability to participate in self-care as condition permits will improve Outcome: Progressing   Problem: Self-Concept: Goal: Will verbalize positive feelings about self Outcome: Progressing   Problem: Education: Goal: Utilization of techniques to improve thought processes will improve Outcome: Progressing Goal: Knowledge of the prescribed therapeutic regimen will improve Outcome: Progressing   Problem: Activity: Goal: Interest or engagement in leisure activities will improve Outcome: Progressing Goal: Imbalance in normal sleep/wake cycle will improve Outcome: Progressing   Problem: Coping: Goal: Coping  ability will improve Outcome: Progressing Goal: Will verbalize feelings Outcome: Progressing   Problem: Health Behavior/Discharge Planning: Goal: Ability to make decisions will improve Outcome: Progressing Goal: Compliance with therapeutic regimen will improve Outcome: Progressing   Problem: Role Relationship: Goal: Will demonstrate positive changes in social behaviors and relationships Outcome: Progressing   Problem: Safety: Goal: Ability to disclose and discuss suicidal ideas will improve Outcome: Progressing Goal: Ability to identify and utilize support systems that promote safety will improve Outcome: Progressing   Problem: Self-Concept: Goal: Will verbalize positive feelings about self Outcome: Progressing Goal: Level of anxiety will decrease Outcome: Progressing   Problem: Education: Goal: Knowledge of [facility] Education information/materials will improve Outcome: Progressing Goal: Emotional status will improve Outcome: Progressing Goal: Mental status will improve Outcome: Progressing Goal: Verbalization of understanding the information provided will improve Outcome: Progressing   Problem: Activity: Goal: Interest or engagement in activities will improve Outcome: Progressing Goal: Sleeping patterns will improve Outcome: Progressing   Problem: Coping: Goal: Ability to verbalize frustrations and anger appropriately will improve Outcome: Progressing Goal: Ability to demonstrate self-control will improve Outcome: Progressing   Problem: Health Behavior/Discharge Planning: Goal: Identification of resources available to assist in meeting health care needs will improve Outcome: Progressing Goal: Compliance with treatment plan for underlying cause of condition will improve Outcome: Progressing   Problem: Physical Regulation: Goal: Ability to maintain clinical measurements within normal limits will improve Outcome: Progressing   Problem:  Safety: Goal: Periods of time without injury will increase Outcome: Progressing   Problem: Education: Goal: Knowledge of General Education information will improve Description: Including pain rating scale, medication(s)/side effects and non-pharmacologic comfort measures Outcome: Progressing   Problem: Health Behavior/Discharge Planning: Goal: Ability to manage health-related needs will improve Outcome: Progressing   Problem: Clinical Measurements: Goal: Ability to maintain clinical measurements within normal limits will improve Outcome: Progressing Goal: Will remain free from infection Outcome: Progressing Goal: Diagnostic test results will improve Outcome:  Progressing Goal: Respiratory complications will improve Outcome: Progressing Goal: Cardiovascular complication will be avoided Outcome: Progressing   Problem: Activity: Goal: Risk for activity intolerance will decrease Outcome: Progressing   Problem: Nutrition: Goal: Adequate nutrition will be maintained Outcome: Progressing   Problem: Coping: Goal: Level of anxiety will decrease Outcome: Progressing

## 2021-12-30 NOTE — Plan of Care (Signed)
  Problem: Activity: Goal: Will verbalize the importance of balancing activity with adequate rest periods Outcome: Progressing   Problem: Education: Goal: Will be free of psychotic symptoms Outcome: Progressing Goal: Knowledge of the prescribed therapeutic regimen will improve Outcome: Progressing   Problem: Health Behavior/Discharge Planning: Goal: Compliance with prescribed medication regimen will improve Outcome: Progressing   Problem: Coping: Goal: Level of anxiety will decrease Outcome: Progressing   Problem: Nutrition: Goal: Adequate nutrition will be maintained Outcome: Progressing   Problem: Activity: Goal: Risk for activity intolerance will decrease Outcome: Progressing   Problem: Clinical Measurements: Goal: Ability to maintain clinical measurements within normal limits will improve Outcome: Progressing Goal: Will remain free from infection Outcome: Progressing Goal: Diagnostic test results will improve Outcome: Progressing Goal: Respiratory complications will improve Outcome: Progressing Goal: Cardiovascular complication will be avoided Outcome: Progressing

## 2021-12-31 NOTE — Progress Notes (Signed)
Timothy [name] received his qhs medication without incident.    C butler-Nicholson, LPN

## 2021-12-31 NOTE — Group Note (Signed)
[facility] LCSW Group Therapy Note   Group Date: 12/31/2021 Start Time: 1300 End Time: 1400  Type of Therapy/Topic:  Group Therapy:  Feelings about Diagnosis  Participation Level:  Active    Description of Group:    This group will allow patients to explore their thoughts and feelings about diagnoses they have received. Patients will be guided to explore their level of understanding and acceptance of these diagnoses. Facilitator will encourage patients to process their thoughts and feelings about the reactions of others to their diagnosis, and will guide patients in identifying ways to discuss their diagnosis with significant others in their lives. This group will be process-oriented, with patients participating in exploration of their own experiences as well as giving and receiving support and challenge from other group members.   Therapeutic Goals: 1. Patient will demonstrate understanding of diagnosis as evidence by identifying two or more symptoms of the disorder:  2. Patient will be able to express two feelings regarding the diagnosis 3. Patient will demonstrate ability to communicate their needs through discussion and/or role plays  Summary of Patient Progress: Patient was present for the entirety of group. He spoke about his experience and feelings around his mental health diagnosis. He acknowledged that in the past he had not accepted it but that now he was coming to terms with it. Pt shared that having mental health diagnosis was not understandable to family but helped others at the same time to better understand him. He stated that family not understanding it forced him to deal with it on his own. Pt stated that having a diagnosis has helped him find the help that he needs and to find a support group of like minded individuals. He was open and receptive to feedback and comments from facilitator and peers.    Therapeutic Modalities:   Cognitive Behavioral Therapy Brief Therapy Feelings  Identification    [name], LCSW

## 2021-12-31 NOTE — Progress Notes (Signed)
Timothy [name] at the nurses station asking to wash his clothes.  Washed clothes as requested. He is pleasant and cooperative.  He reports having auditory hallucination, but does not seem to be bothered by them.  He denies si hi avh and pain at this report. Does report having anxiety and depression. Will continue to monitor.      C Butler-Nicholson

## 2021-12-31 NOTE — Plan of Care (Signed)
D: Pt alert and oriented. Pt endorses experiencing anxiety/depression however, does not rate them. Pt goal: stay focus on how to be better than I was in my pass then to be grateful for my like and I am still here. Pt reports energy level as high and concentration as being poor. Pt reports sleep last night as being poor. Pt did receive medications for sleep and did find them helpful. Pt reports experiencing 7/10 lower back pain at this time, prn medication given. Pt denies experiencing any HI at this time however, endorses experiencing SI and AVH. Pt verbally contracts for safety and states he can be safe while here. Pt reports seeing ghost like things and hearing whispers.    A: Scheduled medications administered to pt, per MD orders. Support and encouragement provided. Frequent verbal contact made. Routine safety checks conducted q15 minutes.   R: No adverse drug reactions noted. Pt verbally contracts for safety at this time. Pt compliant with medications and treatment plan. Pt interacts well with others on the unit. Pt remains safe at this time. Will continue to monitor.   Problem: Activity: Goal: Will verbalize the importance of balancing activity with adequate rest periods Outcome: Progressing   Problem: Education: Goal: Knowledge of the prescribed therapeutic regimen will improve Outcome: Progressing

## 2021-12-31 NOTE — Progress Notes (Signed)
Recreation Therapy Notes  Date: 12/31/2021  Time: 10:45 am     Location: Court yard   Behavioral response: Appropriate  Intervention Topic:  Social Skills   Discussion/Intervention:  Group content on today was focused on Pharmacist, community. The group defined social skills and identified ways they use social skills. Patients expressed what obstacles they face when trying to be social. Participants described the importance of social skills. The group listed ways to improve social skills and reasons to improve social skills. Individuals had an opportunity to learn new and improve social skills as well as identify their weaknesses. Clinical Observations/Feedback: Patient came to group and was able to identify the importance of socializing. Participant was able to express how they normally socialize with others. Individual was social with peers and staff while participating in the intervention.     Tashayla Therien LRT/CTRS         Tomasz Steeves 12/31/2021 12:30 PM

## 2021-12-31 NOTE — Progress Notes (Signed)
[facility] MD Progress Note  12/31/2021 3:13 PM Timothy [name]  MRN:  [id number] Subjective: Follow-up patient with schizoaffective disorder.  Patient says he is hearing voices again.  He is certainly pacing more around the unit.  Passive suicidal thoughts no intent or plan.  Overall affect looks calm.  He has been making some effort to work with [place] on discharge planning Principal Problem: Schizoaffective disorder, depressive type (HCC) Diagnosis: Principal Problem:   Schizoaffective disorder, depressive type (HCC) Active Problems:   Cannabis use disorder, moderate, dependence (HCC)   Homelessness  Total Time spent with patient: 30 minutes  Past Psychiatric History: Past history of schizoaffective disorder  Past Medical History:  Past Medical History:  Diagnosis Date   Cannabis abuse    Paranoid schizophrenia (HCC)    Schizoaffective disorder, depressive type (HCC) 09/17/2014   History reviewed. No pertinent surgical history. Family History:  Family History  Problem Relation Age of Onset   Mental illness Brother    Drug abuse Brother    Mental illness Cousin    Suicidality Cousin    Diabetes Maternal Grandmother    Family Psychiatric  History: See previous Social History:  Social History   Substance and Sexual Activity  Alcohol Use Yes   Alcohol/week: 2.0 standard drinks of alcohol   Types: 2 Cans of beer per week     Social History   Substance and Sexual Activity  Drug Use Yes   Types: Marijuana, MDMA Chiropodist)    Social History   Socioeconomic History   Marital status: Single    Spouse name: Not on file   Number of children: Not on file   Years of education: Not on file   Highest education level: Not on file  Occupational History   Not on file  Tobacco Use   Smoking status: Some Days    Packs/day: 0.25    Types: Cigarettes   Smokeless tobacco: Never  Vaping Use   Vaping Use: Never used  Substance and Sexual Activity   Alcohol use: Yes    Alcohol/week: 2.0  standard drinks of alcohol    Types: 2 Cans of beer per week   Drug use: Yes    Types: Marijuana, MDMA (Ecstacy)   Sexual activity: Yes  Other Topics Concern   Not on file  Social History Narrative   Not on file   Social Determinants of Health   Financial Resource Strain: Low Risk  (12/31/2019)   Overall Financial Resource Strain (CARDIA)    Difficulty of Paying Living Expenses: Not hard at all  Food Insecurity: No Food Insecurity (12/31/2019)   Hunger Vital Sign    Worried About Running Out of Food in the Last Year: Never true    Ran Out of Food in the Last Year: Never true  Transportation Needs: No Transportation Needs (12/31/2019)   PRAPARE - Administrator, Civil Service (Medical): No    Lack of Transportation (Non-Medical): No  Physical Activity: Inactive (12/31/2019)   Exercise Vital Sign    Days of Exercise per Week: 0 days    Minutes of Exercise per Session: 0 min  Stress: Stress Concern Present (12/31/2019)   [employer] of Occupational Health - Occupational Stress Questionnaire    Feeling of Stress : Rather much  Social Connections: Moderately Isolated (12/31/2019)   Social Connection and Isolation Panel [NHANES]    Frequency of Communication with Friends and Family: Once a week    Frequency of Social Gatherings with Friends and Family:  Once a week    Attends Religious Services: More than 4 times per year    Active Member of Clubs or Organizations: Yes    Attends Music therapist: More than 4 times per year    Marital Status: Never married   Additional Social History:                         Sleep: Fair  Appetite:  Fair  Current Medications: Current Facility-Administered Medications  Medication Dose Route Frequency Provider Last Rate Last Admin   acetaminophen (TYLENOL) tablet 650 mg  650 mg Oral Q6H PRN Corby Vandenberghe T, MD   650 mg at 12/31/21 1354   alum & mag hydroxide-simeth (MAALOX/MYLANTA) 200-200-20 MG/5ML suspension  30 mL  30 mL Oral Q4H PRN Desire Fulp T, MD       buPROPion (WELLBUTRIN XL) 24 hr tablet 300 mg  300 mg Oral Daily Vadhir Mcnay T, MD   300 mg at 12/31/21 0819   divalproex (DEPAKOTE) DR tablet 500 mg  500 mg Oral Q12H Khala Tarte T, MD   500 mg at 12/31/21 [id number]   hydrOXYzine (ATARAX) tablet 50 mg  50 mg Oral TID PRN Jaymi Tinner, Madie Reno, MD   50 mg at 12/31/21 [id number]   ibuprofen (ADVIL) tablet 600 mg  600 mg Oral Q6H PRN Dalexa Gentz T, MD   600 mg at 12/31/21 0818   LORazepam (ATIVAN) tablet 2 mg  2 mg Oral Q4H PRN Aritza Brunet T, MD   2 mg at 12/25/21 [id number]   Or   LORazepam (ATIVAN) injection 2 mg  2 mg Intramuscular Q4H PRN Aunesti Pellegrino T, MD       magnesium hydroxide (MILK OF MAGNESIA) suspension 30 mL  30 mL Oral Daily PRN Tonetta Napoles, Madie Reno, MD       neomycin-bacitracin-polymyxin (NEOSPORIN) ointment   Topical PRN Wynell Balloon, Kashif   1 Application at [id number]   nicotine (NICODERM CQ - dosed in mg/24 hours) patch 14 mg  14 mg Transdermal Daily Priyal Musquiz T, MD   14 mg at 12/31/21 0820   pantoprazole (PROTONIX) EC tablet 40 mg  40 mg Oral Daily Tukker Byrns T, MD   40 mg at 12/31/21 0819   QUEtiapine (SEROQUEL) tablet 400 mg  400 mg Oral QHS Malik, Kashif   400 mg at 12/30/21 2109   ziprasidone (GEODON) injection 20 mg  20 mg Intramuscular Q12H PRN Mirranda Monrroy, Madie Reno, MD   20 mg at 12/13/21 1336    Lab Results: No results found for this or any previous visit (from the past 38 hour(s)).  Blood Alcohol level:  Lab Results  Component Value Date   ETH <10 12/03/2021   ETH <10 0000000    Metabolic Disorder Labs: Lab Results  Component Value Date   HGBA1C 5.5 11/04/2021   MPG 111.15 11/04/2021   MPG 114 06/17/2021   Lab Results  Component Value Date   PROLACTIN 33.2 (H) 08/15/2016   Lab Results  Component Value Date   CHOL 149 11/04/2021   TRIG 56 11/04/2021   HDL 35 (L) 11/04/2021   CHOLHDL 4.3 11/04/2021   VLDL 11 11/04/2021   LDLCALC 103 (H) 11/04/2021   LDLCALC 111  (H) 06/17/2021    Physical Findings: AIMS: Facial and Oral Movements Muscles of Facial Expression: None, normal Lips and Perioral Area: None, normal Jaw: None, normal Tongue: None, normal,Extremity Movements Upper (arms, wrists, hands, fingers): None, normal Lower (legs,  knees, ankles, toes): None, normal, Trunk Movements Neck, shoulders, hips: None, normal, Overall Severity Severity of abnormal movements (highest score from questions above): None, normal Incapacitation due to abnormal movements: None, normal Patient's awareness of abnormal movements (rate only patient's report): No Awareness, Dental Status Current problems with teeth and/or dentures?: No Does patient usually wear dentures?: No  CIWA:    COWS:     Musculoskeletal: Strength & Muscle Tone: within normal limits Gait & Station: normal Patient leans: N/A  Psychiatric Specialty Exam:  Presentation  General Appearance: Appropriate for Environment; Casual  Eye Contact:Fair  Speech:Normal Rate  Speech Volume:Decreased  Handedness:Right   Mood and Affect  Mood:Anxious; Depressed  Affect:Constricted   Thought Process  Thought Processes:Coherent  Descriptions of Associations:Intact  Orientation:Full (Time, Place and Person)  Thought Content:Logical  History of Schizophrenia/Schizoaffective disorder:Yes  Duration of Psychotic Symptoms:Greater than six months  Hallucinations:No data recorded Ideas of Reference:None  Suicidal Thoughts:No data recorded Homicidal Thoughts:No data recorded  Sensorium  Memory:Immediate Fair; Remote Good  Judgment:Fair  Insight:Fair   Executive Functions  Concentration:Fair  Attention Span:Fair  Recall:Fair  Fund of Knowledge:Fair  Language:Fair   Psychomotor Activity  Psychomotor Activity:No data recorded  Assets  Assets:Communication Skills; Desire for Improvement; Housing   Sleep  Sleep:No data recorded   Physical Exam: Physical  Exam Vitals and nursing note reviewed.  Constitutional:      Appearance: Normal appearance.  HENT:     Head: Normocephalic and atraumatic.     Mouth/Throat:     Pharynx: Oropharynx is clear.  Eyes:     Pupils: Pupils are equal, round, and reactive to light.  Cardiovascular:     Rate and Rhythm: Normal rate and regular rhythm.  Pulmonary:     Effort: Pulmonary effort is normal.     Breath sounds: Normal breath sounds.  Abdominal:     General: Abdomen is flat.     Palpations: Abdomen is soft.  Musculoskeletal:        General: Normal range of motion.  Skin:    General: Skin is warm and dry.  Neurological:     General: No focal deficit present.     Mental Status: He is alert. Mental status is at baseline.  Psychiatric:        Attention and Perception: He perceives auditory hallucinations.        Mood and Affect: Mood normal.        Thought Content: Thought content normal.    Review of Systems  Constitutional: Negative.   HENT: Negative.    Eyes: Negative.   Respiratory: Negative.    Cardiovascular: Negative.   Gastrointestinal: Negative.   Musculoskeletal: Negative.   Skin: Negative.   Neurological: Negative.   Psychiatric/Behavioral:  Positive for hallucinations. The patient is nervous/anxious.    Blood pressure 136/85, pulse 78, temperature 97.7 F (36.5 C), temperature source Oral, resp. rate 18, height 6\' 3"  (1.905 m), weight 103.9 kg, SpO2 100 %. Body mass index is 28.62 kg/m.   Treatment Plan Summary: Medication management and Plan no change to medication management.  Treatment team continues to try and find creative options for discharge planning.  No change to medicine lots of supportive counseling and encouragement.  , MD 12/31/2021, 3:13 PM

## 2022-01-01 DIAGNOSIS — F251 Schizoaffective disorder, depressive type: Secondary | ICD-10-CM | POA: Diagnosis not present

## 2022-01-01 NOTE — Plan of Care (Signed)
D: Pt alert and oriented. Pt rates depression 7/10, hopelessness 10/10, and anxiety 0/10. Pt goal: " Trying to not be frustrated about my substance use, learning to do right thing." Pt reports energy level as high and concentration as being poor. Pt reports sleep last night as being fair. Pt did receive medications for sleep and did find them helpful. Pt reports experiencing 7/10 generalized pain at this time, pt refuses prn medications at this time. Pt went back to room to rest. Pt reports experiencing any SI/HI, or AVH at this time. Pt verbally contracts for safety. Pt does not have a plan at this time and states he can be safe while here. Pt reports experiencing HI but unable to give details as to whom. Pt reports hearing small whispers but doesn't give details as to what he is seeing.   A: Scheduled medications administered to pt, per MD orders. Support and encouragement provided. Frequent verbal contact made. Routine safety checks conducted q15 minutes.   R: No adverse drug reactions noted. Pt verbally contracts for safety at this time. Pt compliant with medications and treatment plan. Pt interacts well with others on the unit. Pt remains safe at this time. Will continue to monitor.   Problem: Activity: Goal: Will verbalize the importance of balancing activity with adequate rest periods Outcome: Progressing   Problem: Education: Goal: Will be free of psychotic symptoms Outcome: Progressing

## 2022-01-01 NOTE — Progress Notes (Signed)
Patient active on the unit.  He has been in the day room watching TV and playing a [employer] game with his peers.  Tonight he denies si/hi/avh depression anxiety and pain, which a first in as many weeks that he has been here. He is med compliant and received his medication without incident. Will continue to monitor with q 15 minute safety checks      Cleo Butler-Nicholson, LPN

## 2022-01-01 NOTE — Plan of Care (Signed)
  Problem: Decision Making Goal: STG - Patient will successfully identify 2 ways of making healthy decisions post d/c within 5 recreation therapy group sessions Description: STG - Patient will successfully identify 2 ways of making healthy decisions post d/c within 5 recreation therapy group sessions Outcome: Progressing   

## 2022-01-01 NOTE — Plan of Care (Signed)
  Problem: Activity: Goal: Will verbalize the importance of balancing activity with adequate rest periods Outcome: Progressing   Problem: Education: Goal: Will be free of psychotic symptoms Outcome: Progressing Goal: Knowledge of the prescribed therapeutic regimen will improve Outcome: Progressing   Problem: Coping: Goal: Coping ability will improve Outcome: Progressing Goal: Will verbalize feelings Outcome: Progressing   Problem: Health Behavior/Discharge Planning: Goal: Compliance with prescribed medication regimen will improve Outcome: Progressing   Problem: Coping: Goal: Level of anxiety will decrease Outcome: Progressing   Problem: Nutrition: Goal: Adequate nutrition will be maintained Outcome: Progressing   Problem: Activity: Goal: Risk for activity intolerance will decrease Outcome: Progressing   Problem: Clinical Measurements: Goal: Ability to maintain clinical measurements within normal limits will improve Outcome: Progressing Goal: Will remain free from infection Outcome: Progressing Goal: Diagnostic test results will improve Outcome: Progressing Goal: Respiratory complications will improve Outcome: Progressing Goal: Cardiovascular complication will be avoided Outcome: Progressing   Problem: Health Behavior/Discharge Planning: Goal: Ability to manage health-related needs will improve Outcome: Progressing   Problem: Education: Goal: Knowledge of General Education information will improve Description: Including pain rating scale, medication(s)/side effects and non-pharmacologic comfort measures Outcome: Progressing   Problem: Safety: Goal: Periods of time without injury will increase Outcome: Progressing

## 2022-01-01 NOTE — Group Note (Signed)
[facility] LCSW Group Therapy Note   Group Date: 01/01/2022 Start Time: 1300 End Time: 1400  Type of Therapy and Topic:  Group Therapy:  Feelings around Relapse and Recovery  Participation Level:  Did Not Attend    Description of Group:    Patients in this group will discuss emotions they experience before and after a relapse. They will process how experiencing these feelings, or avoidance of experiencing them, relates to having a relapse. Facilitator will guide patients to explore emotions they have related to recovery. Patients will be encouraged to process which emotions are more powerful. They will be guided to discuss the emotional reaction significant others in their lives may have to patients' relapse or recovery. Patients will be assisted in exploring ways to respond to the emotions of others without this contributing to a relapse.  Therapeutic Goals: Patient will identify two or more emotions that lead to relapse for them:  Patient will identify two emotions that result when they relapse:  Patient will identify two emotions related to recovery:  Patient will demonstrate ability to communicate their needs through discussion and/or role plays.   Summary of Patient Progress:  Group not held due to acuity on the unit.   Therapeutic Modalities:   Cognitive Behavioral Therapy Solution-Focused Therapy Assertiveness Training Relapse Prevention Therapy   [place] R Tyus Kallam, LCSW 

## 2022-01-01 NOTE — Progress Notes (Signed)
[facility] MD Progress Note  01/01/2022 3:38 PM Timothy [name]  MRN:  [id number] Subjective: No new complaints.  Vague sense of depression and hopelessness no active suicidal intent.  Will sometimes complain of hallucinations but does not appear to be responding to internal stimuli Principal Problem: Schizoaffective disorder, depressive type (HCC) Diagnosis: Principal Problem:   Schizoaffective disorder, depressive type (HCC) Active Problems:   Cannabis use disorder, moderate, dependence (HCC)   Homelessness  Total Time spent with patient: 30 minutes  Past Psychiatric History: Past history of schizoaffective disorder recurrent depression and psychotic symptoms  Past Medical History:  Past Medical History:  Diagnosis Date   Cannabis abuse    Paranoid schizophrenia (HCC)    Schizoaffective disorder, depressive type (HCC) 09/17/2014   History reviewed. No pertinent surgical history. Family History:  Family History  Problem Relation Age of Onset   Mental illness Brother    Drug abuse Brother    Mental illness Cousin    Suicidality Cousin    Diabetes Maternal Grandmother    Family Psychiatric  History: See previous Social History:  Social History   Substance and Sexual Activity  Alcohol Use Yes   Alcohol/week: 2.0 standard drinks of alcohol   Types: 2 Cans of beer per week     Social History   Substance and Sexual Activity  Drug Use Yes   Types: Marijuana, MDMA Chiropodist)    Social History   Socioeconomic History   Marital status: Single    Spouse name: Not on file   Number of children: Not on file   Years of education: Not on file   Highest education level: Not on file  Occupational History   Not on file  Tobacco Use   Smoking status: Some Days    Packs/day: 0.25    Types: Cigarettes   Smokeless tobacco: Never  Vaping Use   Vaping Use: Never used  Substance and Sexual Activity   Alcohol use: Yes    Alcohol/week: 2.0 standard drinks of alcohol    Types: 2 Cans of  beer per week   Drug use: Yes    Types: Marijuana, MDMA (Ecstacy)   Sexual activity: Yes  Other Topics Concern   Not on file  Social History Narrative   Not on file   Social Determinants of Health   Financial Resource Strain: Low Risk  (12/31/2019)   Overall Financial Resource Strain (CARDIA)    Difficulty of Paying Living Expenses: Not hard at all  Food Insecurity: No Food Insecurity (12/31/2019)   Hunger Vital Sign    Worried About Running Out of Food in the Last Year: Never true    Ran Out of Food in the Last Year: Never true  Transportation Needs: No Transportation Needs (12/31/2019)   PRAPARE - Administrator, Civil Service (Medical): No    Lack of Transportation (Non-Medical): No  Physical Activity: Inactive (12/31/2019)   Exercise Vital Sign    Days of Exercise per Week: 0 days    Minutes of Exercise per Session: 0 min  Stress: Stress Concern Present (12/31/2019)   [employer] of Occupational Health - Occupational Stress Questionnaire    Feeling of Stress : Rather much  Social Connections: Moderately Isolated (12/31/2019)   Social Connection and Isolation Panel [NHANES]    Frequency of Communication with Friends and Family: Once a week    Frequency of Social Gatherings with Friends and Family: Once a week    Attends Religious Services: More than 4 times per  year    Active Member of Clubs or Organizations: Yes    Attends Engineer, structural: More than 4 times per year    Marital Status: Never married   Additional Social History:                         Sleep: Fair  Appetite:  Fair  Current Medications: Current Facility-Administered Medications  Medication Dose Route Frequency Provider Last Rate Last Admin   acetaminophen (TYLENOL) tablet 650 mg  650 mg Oral Q6H PRN Wandy Bossler T, MD   650 mg at 01/01/22 0003   alum & mag hydroxide-simeth (MAALOX/MYLANTA) 200-200-20 MG/5ML suspension 30 mL  30 mL Oral Q4H PRN Jannett Schmall T, MD        buPROPion (WELLBUTRIN XL) 24 hr tablet 300 mg  300 mg Oral Daily Cleveland Yarbro T, MD   300 mg at 01/01/22 0800   divalproex (DEPAKOTE) DR tablet 500 mg  500 mg Oral Q12H Alexandre Lightsey T, MD   500 mg at 01/01/22 0800   hydrOXYzine (ATARAX) tablet 50 mg  50 mg Oral TID PRN Lillar Bianca, Jackquline Denmark, MD   50 mg at 01/01/22 0802   ibuprofen (ADVIL) tablet 600 mg  600 mg Oral Q6H PRN Velora Horstman T, MD   600 mg at 12/31/21 0818   LORazepam (ATIVAN) tablet 2 mg  2 mg Oral Q4H PRN Berthe Oley T, MD   2 mg at 12/25/21 7026   Or   LORazepam (ATIVAN) injection 2 mg  2 mg Intramuscular Q4H PRN Nephi Savage T, MD       magnesium hydroxide (MILK OF MAGNESIA) suspension 30 mL  30 mL Oral Daily PRN Taegen Delker, Jackquline Denmark, MD       neomycin-bacitracin-polymyxin (NEOSPORIN) ointment   Topical PRN Danelle Earthly, Kashif   1 Application at 12/19/21 2112   nicotine (NICODERM CQ - dosed in mg/24 hours) patch 14 mg  14 mg Transdermal Daily Geronimo Diliberto T, MD   14 mg at 12/31/21 0820   pantoprazole (PROTONIX) EC tablet 40 mg  40 mg Oral Daily Kendarius Vigen T, MD   40 mg at 01/01/22 0800   QUEtiapine (SEROQUEL) tablet 400 mg  400 mg Oral QHS Malik, Kashif   400 mg at 12/31/21 2256   ziprasidone (GEODON) injection 20 mg  20 mg Intramuscular Q12H PRN Granvel Proudfoot, Jackquline Denmark, MD   20 mg at 12/13/21 1336    Lab Results: No results found for this or any previous visit (from the past 48 hour(s)).  Blood Alcohol level:  Lab Results  Component Value Date   ETH <10 12/03/2021   ETH <10 10/31/2021    Metabolic Disorder Labs: Lab Results  Component Value Date   HGBA1C 5.5 11/04/2021   MPG 111.15 11/04/2021   MPG 114 06/17/2021   Lab Results  Component Value Date   PROLACTIN 33.2 (H) 08/15/2016   Lab Results  Component Value Date   CHOL 149 11/04/2021   TRIG 56 11/04/2021   HDL 35 (L) 11/04/2021   CHOLHDL 4.3 11/04/2021   VLDL 11 11/04/2021   LDLCALC 103 (H) 11/04/2021   LDLCALC 111 (H) 06/17/2021    Physical Findings: AIMS:  Facial and Oral Movements Muscles of Facial Expression: None, normal Lips and Perioral Area: None, normal Jaw: None, normal Tongue: None, normal,Extremity Movements Upper (arms, wrists, hands, fingers): None, normal Lower (legs, knees, ankles, toes): None, normal, Trunk Movements Neck, shoulders, hips: None, normal, Overall Severity  Severity of abnormal movements (highest score from questions above): None, normal Incapacitation due to abnormal movements: None, normal Patient's awareness of abnormal movements (rate only patient's report): No Awareness, Dental Status Current problems with teeth and/or dentures?: No Does patient usually wear dentures?: No  CIWA:    COWS:     Musculoskeletal: Strength & Muscle Tone: within normal limits Gait & Station: normal Patient leans: N/A  Psychiatric Specialty Exam:  Presentation  General Appearance: Appropriate for Environment; Casual  Eye Contact:Fair  Speech:Normal Rate  Speech Volume:Decreased  Handedness:Right   Mood and Affect  Mood:Anxious; Depressed  Affect:Constricted   Thought Process  Thought Processes:Coherent  Descriptions of Associations:Intact  Orientation:Full (Time, Place and Person)  Thought Content:Logical  History of Schizophrenia/Schizoaffective disorder:Yes  Duration of Psychotic Symptoms:Greater than six months  Hallucinations:No data recorded Ideas of Reference:None  Suicidal Thoughts:No data recorded Homicidal Thoughts:No data recorded  Sensorium  Memory:Immediate Fair; Remote Good  Judgment:Fair  Insight:Fair   Executive Functions  Concentration:Fair  Attention Span:Fair  Recall:Fair  Fund of Knowledge:Fair  Language:Fair   Psychomotor Activity  Psychomotor Activity:No data recorded  Assets  Assets:Communication Skills; Desire for Improvement; Housing   Sleep  Sleep:No data recorded   Physical Exam: Physical Exam Vitals and nursing note reviewed.   Constitutional:      Appearance: Normal appearance.  HENT:     Head: Normocephalic and atraumatic.     Mouth/Throat:     Pharynx: Oropharynx is clear.  Eyes:     Pupils: Pupils are equal, round, and reactive to light.  Cardiovascular:     Rate and Rhythm: Normal rate and regular rhythm.  Pulmonary:     Effort: Pulmonary effort is normal.     Breath sounds: Normal breath sounds.  Abdominal:     General: Abdomen is flat.     Palpations: Abdomen is soft.  Musculoskeletal:        General: Normal range of motion.  Skin:    General: Skin is warm and dry.  Neurological:     General: No focal deficit present.     Mental Status: He is alert. Mental status is at baseline.  Psychiatric:        Attention and Perception: Attention normal.        Mood and Affect: Mood normal. Affect is blunt.        Speech: Speech normal.        Behavior: Behavior is cooperative.        Thought Content: Thought content normal.        Cognition and Memory: Cognition normal.    Review of Systems  Constitutional: Negative.   HENT: Negative.    Eyes: Negative.   Respiratory: Negative.    Cardiovascular: Negative.   Gastrointestinal: Negative.   Musculoskeletal: Negative.   Skin: Negative.   Neurological: Negative.   Psychiatric/Behavioral: Negative.     Blood pressure 120/75, pulse 77, temperature (!) 97.1 F (36.2 C), temperature source Oral, resp. rate 18, height 6\' 3"  (1.905 m), weight 103.9 kg, SpO2 92 %. Body mass index is 28.62 kg/m.   Treatment Plan Summary: Medication management and Plan no change to medication.  Supportive counseling.  Encourage patient to attend groups and be active be physically active.  Please be active and working with the whole treatment team to see if we can ultimately find some kind of discharge plan  , MD 01/01/2022, 3:38 PM

## 2022-01-02 DIAGNOSIS — F251 Schizoaffective disorder, depressive type: Secondary | ICD-10-CM | POA: Diagnosis not present

## 2022-01-02 NOTE — Group Note (Signed)
LCSW Group Therapy Note   Group Date: 01/02/2022 Start Time: 1230 End Time: 1330   Type of Therapy and Topic:  Group Therapy: Challenging Core Beliefs  Participation Level:  Active  Description of Group:  Patients were educated about core beliefs and asked to identify one harmful core belief that they have. Patients were asked to explore from where those beliefs originate. Patients were asked to discuss how those beliefs make them feel and the resulting behaviors of those beliefs. They were then be asked if those beliefs are true and, if so, what evidence they have to support them. Lastly, group members were challenged to replace those negative core beliefs with helpful beliefs.   Therapeutic Goals:   1. Patient will identify harmful core beliefs and explore the origins of such beliefs. 2. Patient will identify feelings and behaviors that result from those core beliefs. 3. Patient will discuss whether such beliefs are true. 4.  Patient will replace harmful core beliefs with helpful ones.  Summary of Patient Progress:  Timothy [name] actively engaged in processing and exploring how core beliefs are formed and how they impact thoughts, feelings, and behaviors. Patient proved open to input from peers and feedback from CSW. Patient demonstrated improving insight into the subject matter, was respectful and supportive of peers, and participated throughout the entire session.  Therapeutic Modalities: Cognitive Behavioral Therapy; Solution-Focused Therapy   Timothy Sant[place], LCSW 01/02/2022  1:18 PM

## 2022-01-02 NOTE — Plan of Care (Signed)
Met with pt is medication room. Pt is adherent with scheduled medications. Pt denies SI / HI / AVH. Staff will continue to monitor for safety.   Problem: Activity: Goal: Will verbalize the importance of balancing activity with adequate rest periods Outcome: Not Progressing   Problem: Education: Goal: Will be free of psychotic symptoms Outcome: Not Progressing Goal: Knowledge of the prescribed therapeutic regimen will improve Outcome: Not Progressing

## 2022-01-02 NOTE — Progress Notes (Signed)
Patient alert and oriented x 4 no distress noted, denies SI/HI/AVH, 15 minutes safety checks maintained will continue to monitor closely.

## 2022-01-02 NOTE — Progress Notes (Signed)
[facility] MD Progress Note  01/02/2022 12:12 PM Timothy [name]  MRN:  [id number] Subjective: Follow-up patient with schizoaffective bipolar.  Continues to be somewhat depressed and anxious.  Rare hallucinations.  Suicidal thoughts are passive.  No active plan.  Continues to be upset about not having a place to go.  Continues to complain of a tremor which is really not visible but seems more like subjective anxiety Principal Problem: Schizoaffective disorder, depressive type (HCC) Diagnosis: Principal Problem:   Schizoaffective disorder, depressive type (HCC) Active Problems:   Cannabis use disorder, moderate, dependence (HCC)   Homelessness  Total Time spent with patient: 30 minutes  Past Psychiatric History: Past history of recurrent psychotic depression and intermittent agitation  Past Medical History:  Past Medical History:  Diagnosis Date   Cannabis abuse    Paranoid schizophrenia (HCC)    Schizoaffective disorder, depressive type (HCC) 09/17/2014   History reviewed. No pertinent surgical history. Family History:  Family History  Problem Relation Age of Onset   Mental illness Brother    Drug abuse Brother    Mental illness Cousin    Suicidality Cousin    Diabetes Maternal Grandmother    Family Psychiatric  History: See previous Social History:  Social History   Substance and Sexual Activity  Alcohol Use Yes   Alcohol/week: 2.0 standard drinks of alcohol   Types: 2 Cans of beer per week     Social History   Substance and Sexual Activity  Drug Use Yes   Types: Marijuana, MDMA Chiropodist)    Social History   Socioeconomic History   Marital status: Single    Spouse name: Not on file   Number of children: Not on file   Years of education: Not on file   Highest education level: Not on file  Occupational History   Not on file  Tobacco Use   Smoking status: Some Days    Packs/day: 0.25    Types: Cigarettes   Smokeless tobacco: Never  Vaping Use   Vaping Use: Never used   Substance and Sexual Activity   Alcohol use: Yes    Alcohol/week: 2.0 standard drinks of alcohol    Types: 2 Cans of beer per week   Drug use: Yes    Types: Marijuana, MDMA (Ecstacy)   Sexual activity: Yes  Other Topics Concern   Not on file  Social History Narrative   Not on file   Social Determinants of Health   Financial Resource Strain: Low Risk  (12/31/2019)   Overall Financial Resource Strain (CARDIA)    Difficulty of Paying Living Expenses: Not hard at all  Food Insecurity: No Food Insecurity (12/31/2019)   Hunger Vital Sign    Worried About Running Out of Food in the Last Year: Never true    Ran Out of Food in the Last Year: Never true  Transportation Needs: No Transportation Needs (12/31/2019)   PRAPARE - Administrator, Civil Service (Medical): No    Lack of Transportation (Non-Medical): No  Physical Activity: Inactive (12/31/2019)   Exercise Vital Sign    Days of Exercise per Week: 0 days    Minutes of Exercise per Session: 0 min  Stress: Stress Concern Present (12/31/2019)   [employer] of Occupational Health - Occupational Stress Questionnaire    Feeling of Stress : Rather much  Social Connections: Moderately Isolated (12/31/2019)   Social Connection and Isolation Panel [NHANES]    Frequency of Communication with Friends and Family: Once a week  Frequency of Social Gatherings with Friends and Family: Once a week    Attends Religious Services: More than 4 times per year    Active Member of [employer] or Organizations: Yes    Attends Engineer, structural: More than 4 times per year    Marital Status: Never married   Additional Social History:                         Sleep: Fair  Appetite:  Fair  Current Medications: Current Facility-Administered Medications  Medication Dose Route Frequency Provider Last Rate Last Admin   acetaminophen (TYLENOL) tablet 650 mg  650 mg Oral Q6H PRN Branon Sabine T, MD   650 mg at 01/01/22 2117    alum & mag hydroxide-simeth (MAALOX/MYLANTA) 200-200-20 MG/5ML suspension 30 mL  30 mL Oral Q4H PRN Massiah Minjares T, MD       buPROPion (WELLBUTRIN XL) 24 hr tablet 300 mg  300 mg Oral Daily Khadeeja Elden T, MD   300 mg at 01/02/22 0813   divalproex (DEPAKOTE) DR tablet 500 mg  500 mg Oral Q12H Briley Sulton T, MD   500 mg at 01/02/22 0813   hydrOXYzine (ATARAX) tablet 50 mg  50 mg Oral TID PRN Andreas Sobolewski, Jackquline Denmark, MD   50 mg at 01/01/22 0802   ibuprofen (ADVIL) tablet 600 mg  600 mg Oral Q6H PRN Malinda Mayden T, MD   600 mg at 12/31/21 0818   LORazepam (ATIVAN) tablet 2 mg  2 mg Oral Q4H PRN Tyliek Timberman T, MD   2 mg at 01/01/22 2118   Or   LORazepam (ATIVAN) injection 2 mg  2 mg Intramuscular Q4H PRN Reno Clasby T, MD       magnesium hydroxide (MILK OF MAGNESIA) suspension 30 mL  30 mL Oral Daily PRN Koltan Portocarrero, Jackquline Denmark, MD       neomycin-bacitracin-polymyxin (NEOSPORIN) ointment   Topical PRN Danelle Earthly, Kashif   1 Application at 12/19/21 2112   nicotine (NICODERM CQ - dosed in mg/24 hours) patch 14 mg  14 mg Transdermal Daily Jourden Gilson T, MD   14 mg at 01/02/22 0813   pantoprazole (PROTONIX) EC tablet 40 mg  40 mg Oral Daily Reinhold Rickey T, MD   40 mg at 01/02/22 0813   QUEtiapine (SEROQUEL) tablet 400 mg  400 mg Oral QHS Malik, Kashif   400 mg at 01/01/22 2117   ziprasidone (GEODON) injection 20 mg  20 mg Intramuscular Q12H PRN Ethelmae Ringel, Jackquline Denmark, MD   20 mg at 12/13/21 1336    Lab Results: No results found for this or any previous visit (from the past 48 hour(s)).  Blood Alcohol level:  Lab Results  Component Value Date   ETH <10 12/03/2021   ETH <10 10/31/2021    Metabolic Disorder Labs: Lab Results  Component Value Date   HGBA1C 5.5 11/04/2021   MPG 111.15 11/04/2021   MPG 114 06/17/2021   Lab Results  Component Value Date   PROLACTIN 33.2 (H) 08/15/2016   Lab Results  Component Value Date   CHOL 149 11/04/2021   TRIG 56 11/04/2021   HDL 35 (L) 11/04/2021   CHOLHDL 4.3  11/04/2021   VLDL 11 11/04/2021   LDLCALC 103 (H) 11/04/2021   LDLCALC 111 (H) 06/17/2021    Physical Findings: AIMS: Facial and Oral Movements Muscles of Facial Expression: None, normal Lips and Perioral Area: None, normal Jaw: None, normal Tongue: None, normal,Extremity Movements Upper (  arms, wrists, hands, fingers): None, normal Lower (legs, knees, ankles, toes): None, normal, Trunk Movements Neck, shoulders, hips: None, normal, Overall Severity Severity of abnormal movements (highest score from questions above): None, normal Incapacitation due to abnormal movements: None, normal Patient's awareness of abnormal movements (rate only patient's report): No Awareness, Dental Status Current problems with teeth and/or dentures?: No Does patient usually wear dentures?: No  CIWA:    COWS:     Musculoskeletal: Strength & Muscle Tone: within normal limits Gait & Station: normal Patient leans: N/A  Psychiatric Specialty Exam:  Presentation  General Appearance: Appropriate for Environment; Casual  Eye Contact:Fair  Speech:Normal Rate  Speech Volume:Decreased  Handedness:Right   Mood and Affect  Mood:Anxious; Depressed  Affect:Constricted   Thought Process  Thought Processes:Coherent  Descriptions of Associations:Intact  Orientation:Full (Time, Place and Person)  Thought Content:Logical  History of Schizophrenia/Schizoaffective disorder:Yes  Duration of Psychotic Symptoms:Greater than six months  Hallucinations:No data recorded Ideas of Reference:None  Suicidal Thoughts:No data recorded Homicidal Thoughts:No data recorded  Sensorium  Memory:Immediate Fair; Remote Good  Judgment:Fair  Insight:Fair   Executive Functions  Concentration:Fair  Attention Span:Fair  Recall:Fair  Fund of Knowledge:Fair  Language:Fair   Psychomotor Activity  Psychomotor Activity:No data recorded  Assets  Assets:Communication Skills; Desire for Improvement;  Housing   Sleep  Sleep:No data recorded   Physical Exam: Physical Exam Vitals and nursing note reviewed.  Constitutional:      Appearance: Normal appearance.  HENT:     Head: Normocephalic and atraumatic.     Mouth/Throat:     Pharynx: Oropharynx is clear.  Eyes:     Pupils: Pupils are equal, round, and reactive to light.  Cardiovascular:     Rate and Rhythm: Normal rate and regular rhythm.  Pulmonary:     Effort: Pulmonary effort is normal.     Breath sounds: Normal breath sounds.  Abdominal:     General: Abdomen is flat.     Palpations: Abdomen is soft.  Musculoskeletal:        General: Normal range of motion.  Skin:    General: Skin is warm and dry.  Neurological:     General: No focal deficit present.     Mental Status: He is alert. Mental status is at baseline.  Psychiatric:        Attention and Perception: Attention normal.        Mood and Affect: Mood is anxious.        Speech: Speech normal.        Behavior: Behavior normal.        Thought Content: Thought content normal.        Cognition and Memory: Cognition normal.    Review of Systems  Constitutional: Negative.   HENT: Negative.    Eyes: Negative.   Respiratory: Negative.    Cardiovascular: Negative.   Gastrointestinal: Negative.   Musculoskeletal: Negative.   Skin: Negative.   Neurological: Negative.   Psychiatric/Behavioral:  Positive for depression. The patient is nervous/anxious.    Blood pressure 137/85, pulse 81, temperature 98 F (36.7 C), temperature source Oral, resp. rate 18, height 6\' 3"  (1.905 m), weight 103.9 kg, SpO2 100 %. Body mass index is 28.62 kg/m.   Treatment Plan Summary: Medication management and Plan no change to treatment plan.  We will try to see if we can find some kind of disposition for him over the next week or so.  I do not see any clear indication to change any medicine at this  point.  Mordecai Rasmussen, MD 01/02/2022, 12:12 PM

## 2022-01-03 NOTE — Group Note (Signed)
LCSW Group Therapy Note  Group Date: 01/03/2022 Start Time: 1300 End Time: 1400   Type of Therapy and Topic:  Group Therapy - Healthy vs Unhealthy Coping Skills  Participation Level:  Active   Description of Group The focus of this group was to determine what unhealthy coping techniques typically are used by group members and what healthy coping techniques would be helpful in coping with various problems. Patients were guided in becoming aware of the differences between healthy and unhealthy coping techniques. Patients were asked to identify 2-3 healthy coping skills they would like to learn to use more effectively.  Therapeutic Goals Patients learned that coping is what human beings do all day long to deal with various situations in their lives Patients defined and discussed healthy vs unhealthy coping techniques Patients identified their preferred coping techniques and identified whether these were healthy or unhealthy Patients determined 2-3 healthy coping skills they would like to become more familiar with and use more often. Patients provided support and ideas to each other   Summary of Patient Progress:  Patient was present for the entirety of the group session. Patient was an active listener and participated in the topic of discussion, provided helpful advice to others, and added nuance to topic of conversation. Patient shared he likes to work out in order to help him re-center himself.    Therapeutic Modalities Cognitive Behavioral Therapy Motivational Interviewing  Almedia Balls 01/03/2022  2:16 PM

## 2022-01-03 NOTE — Progress Notes (Signed)
[facility] MD Progress Note  01/03/2022 12:39 PM Timothy [name]  MRN:  [id number] Subjective: Patient continues to describe himself as anxious.  Behavior has been calm and appropriate.  No aggression and no self-injury.  Taking care of his ADLs and hygiene appropriately.  No bizarre behavior.  Does not seem to be obviously psychotic.  Mostly worried about not having a place to go as has been the case for days now Principal Problem: Schizoaffective disorder, depressive type ([place]) Diagnosis: Principal Problem:   Schizoaffective disorder, depressive type ([place]) Active Problems:   Cannabis use disorder, moderate, dependence ([place])   Homelessness  Total Time spent with patient: 15 minutes  Past Psychiatric History: Past history of recurrent depression with psychosis possible bipolar or schizoaffective disorder  Past Medical History:  Past Medical History:  Diagnosis Date   Cannabis abuse    Paranoid schizophrenia ([place])    Schizoaffective disorder, depressive type ([place]) 09/17/2014   History reviewed. No pertinent surgical history. Family History:  Family History  Problem Relation Age of Onset   Mental illness Brother    Drug abuse Brother    Mental illness Cousin    Suicidality Cousin    Diabetes Maternal Grandmother    Family Psychiatric  History: See previous Social History:  Social History   Substance and Sexual Activity  Alcohol Use Yes   Alcohol/week: 2.0 standard drinks of alcohol   Types: 2 Cans of beer per week     Social History   Substance and Sexual Activity  Drug Use Yes   Types: Marijuana, MDMA Banker)    Social History   Socioeconomic History   Marital status: Single    Spouse name: Not on file   Number of children: Not on file   Years of education: Not on file   Highest education level: Not on file  Occupational History   Not on file  Tobacco Use   Smoking status: Some Days    Packs/day: 0.25    Types: Cigarettes   Smokeless tobacco: Never  Vaping Use    Vaping Use: Never used  Substance and Sexual Activity   Alcohol use: Yes    Alcohol/week: 2.0 standard drinks of alcohol    Types: 2 Cans of beer per week   Drug use: Yes    Types: Marijuana, MDMA (Ecstacy)   Sexual activity: Yes  Other Topics Concern   Not on file  Social History Narrative   Not on file   Social Determinants of Health   Financial Resource Strain: Low Risk  (12/31/2019)   Overall Financial Resource Strain (CARDIA)    Difficulty of Paying Living Expenses: Not hard at all  Food Insecurity: No Food Insecurity (12/31/2019)   Hunger Vital Sign    Worried About Running Out of Food in the Last Year: Never true    Ran Out of Food in the Last Year: Never true  Transportation Needs: No Transportation Needs (12/31/2019)   PRAPARE - Hydrologist (Medical): No    Lack of Transportation (Non-Medical): No  Physical Activity: Inactive (12/31/2019)   Exercise Vital Sign    Days of Exercise per Week: 0 days    Minutes of Exercise per Session: 0 min  Stress: Stress Concern Present (12/31/2019)   [place]    Feeling of Stress : Rather much  Social Connections: Moderately Isolated (12/31/2019)   Social Connection and Isolation Panel [NHANES]    Frequency of Communication with  Friends and Family: Once a week    Frequency of Social Gatherings with Friends and Family: Once a week    Attends Religious Services: More than 4 times per year    Active Member of [employer] or Organizations: Yes    Attends Engineer, structural: More than 4 times per year    Marital Status: Never married   Additional Social History:                         Sleep: Fair  Appetite:  Fair  Current Medications: Current Facility-Administered Medications  Medication Dose Route Frequency Provider Last Rate Last Admin   acetaminophen (TYLENOL) tablet 650 mg  650 mg Oral Q6H PRN Tinya Cadogan T, MD   650  mg at 01/02/22 2133   alum & mag hydroxide-simeth (MAALOX/MYLANTA) 200-200-20 MG/5ML suspension 30 mL  30 mL Oral Q4H PRN Emi Lymon T, MD       buPROPion (WELLBUTRIN XL) 24 hr tablet 300 mg  300 mg Oral Daily Stephaie Dardis T, MD   300 mg at 01/03/22 0941   divalproex (DEPAKOTE) DR tablet 500 mg  500 mg Oral Q12H Aariv Medlock, Jackquline Denmark, MD   500 mg at 01/03/22 6606   hydrOXYzine (ATARAX) tablet 50 mg  50 mg Oral TID PRN Ahmet Schank, Jackquline Denmark, MD   50 mg at 01/01/22 0802   ibuprofen (ADVIL) tablet 600 mg  600 mg Oral Q6H PRN Bradely Rudin T, MD   600 mg at 12/31/21 0818   LORazepam (ATIVAN) tablet 2 mg  2 mg Oral Q4H PRN Jolea Dolle T, MD   2 mg at 01/02/22 2133   Or   LORazepam (ATIVAN) injection 2 mg  2 mg Intramuscular Q4H PRN Aleksey Newbern T, MD       magnesium hydroxide (MILK OF MAGNESIA) suspension 30 mL  30 mL Oral Daily PRN Leone Mobley, Jackquline Denmark, MD       neomycin-bacitracin-polymyxin (NEOSPORIN) ointment   Topical PRN Danelle Earthly, Kashif   1 Application at 12/19/21 2112   nicotine (NICODERM CQ - dosed in mg/24 hours) patch 14 mg  14 mg Transdermal Daily Alyssamarie Mounsey, Jackquline Denmark, MD   14 mg at 01/03/22 0943   pantoprazole (PROTONIX) EC tablet 40 mg  40 mg Oral Daily Marrie Chandra T, MD   40 mg at 01/03/22 0942   QUEtiapine (SEROQUEL) tablet 400 mg  400 mg Oral QHS Malik, Kashif   400 mg at 01/02/22 2134   ziprasidone (GEODON) injection 20 mg  20 mg Intramuscular Q12H PRN Anderson Middlebrooks, Jackquline Denmark, MD   20 mg at 12/13/21 1336    Lab Results: No results found for this or any previous visit (from the past 48 hour(s)).  Blood Alcohol level:  Lab Results  Component Value Date   ETH <10 12/03/2021   ETH <10 10/31/2021    Metabolic Disorder Labs: Lab Results  Component Value Date   HGBA1C 5.5 11/04/2021   MPG 111.15 11/04/2021   MPG 114 06/17/2021   Lab Results  Component Value Date   PROLACTIN 33.2 (H) 08/15/2016   Lab Results  Component Value Date   CHOL 149 11/04/2021   TRIG 56 11/04/2021   HDL 35 (L)  11/04/2021   CHOLHDL 4.3 11/04/2021   VLDL 11 11/04/2021   LDLCALC 103 (H) 11/04/2021   LDLCALC 111 (H) 06/17/2021    Physical Findings: AIMS: Facial and Oral Movements Muscles of Facial Expression: None, normal Lips and Perioral Area: None,  normal Jaw: None, normal Tongue: None, normal,Extremity Movements Upper (arms, wrists, hands, fingers): None, normal Lower (legs, knees, ankles, toes): None, normal, Trunk Movements Neck, shoulders, hips: None, normal, Overall Severity Severity of abnormal movements (highest score from questions above): None, normal Incapacitation due to abnormal movements: None, normal Patient's awareness of abnormal movements (rate only patient's report): No Awareness, Dental Status Current problems with teeth and/or dentures?: No Does patient usually wear dentures?: No  CIWA:    COWS:     Musculoskeletal: Strength & Muscle Tone: within normal limits Gait & Station: normal Patient leans: N/A  Psychiatric Specialty Exam:  Presentation  General Appearance: Appropriate for Environment; Casual  Eye Contact:Fair  Speech:Normal Rate  Speech Volume:Decreased  Handedness:Right   Mood and Affect  Mood:Anxious; Depressed  Affect:Constricted   Thought Process  Thought Processes:Coherent  Descriptions of Associations:Intact  Orientation:Full (Time, Place and Person)  Thought Content:Logical  History of Schizophrenia/Schizoaffective disorder:Yes  Duration of Psychotic Symptoms:Greater than six months  Hallucinations:No data recorded Ideas of Reference:None  Suicidal Thoughts:No data recorded Homicidal Thoughts:No data recorded  Sensorium  Memory:Immediate Fair; Remote Good  Judgment:Fair  Insight:Fair   Executive Functions  Concentration:Fair  Attention Span:Fair  Recall:Fair  Fund of Knowledge:Fair  Language:Fair   Psychomotor Activity  Psychomotor Activity:No data recorded  Assets  Assets:Communication Skills;  Desire for Improvement; Housing   Sleep  Sleep:No data recorded   Physical Exam: Physical Exam Vitals and nursing note reviewed.  Constitutional:      Appearance: Normal appearance.  HENT:     Head: Normocephalic and atraumatic.     Mouth/Throat:     Pharynx: Oropharynx is clear.  Eyes:     Pupils: Pupils are equal, round, and reactive to light.  Cardiovascular:     Rate and Rhythm: Normal rate and regular rhythm.  Pulmonary:     Effort: Pulmonary effort is normal.     Breath sounds: Normal breath sounds.  Abdominal:     General: Abdomen is flat.     Palpations: Abdomen is soft.  Musculoskeletal:        General: Normal range of motion.  Skin:    General: Skin is warm and dry.  Neurological:     General: No focal deficit present.     Mental Status: He is alert. Mental status is at baseline.  Psychiatric:        Attention and Perception: Attention normal.        Mood and Affect: Mood is anxious.        Speech: Speech normal.        Behavior: Behavior is cooperative.        Thought Content: Thought content normal.        Cognition and Memory: Cognition normal.    Review of Systems  Constitutional: Negative.   HENT: Negative.    Eyes: Negative.   Respiratory: Negative.    Cardiovascular: Negative.   Gastrointestinal: Negative.   Musculoskeletal: Negative.   Skin: Negative.   Neurological: Negative.   Psychiatric/Behavioral:  The patient is nervous/anxious.    Blood pressure 136/89, pulse 83, temperature 97.9 F (36.6 C), temperature source Oral, resp. rate 18, height 6\' 3"  (1.905 m), weight 103.9 kg, SpO2 100 %. Body mass index is 28.62 kg/m.   Treatment Plan Summary: Medication management and Plan no change to medication.  Anxiety primarily situational.  Psychoeducation and support.  Patient seems to be looking into some possible options for discharge over the next week or so.  , MD  01/03/2022, 12:39 PM

## 2022-01-03 NOTE — Plan of Care (Signed)
  Problem: Activity: Goal: Will verbalize the importance of balancing activity with adequate rest periods Outcome: Progressing   Problem: Education: Goal: Knowledge of the prescribed therapeutic regimen will improve Outcome: Progressing   Problem: Health Behavior/Discharge Planning: Goal: Compliance with prescribed medication regimen will improve Outcome: Progressing   Problem: Role Relationship: Goal: Ability to interact with others will improve Outcome: Progressing   Problem: Safety: Goal: Ability to remain free from injury will improve Outcome: Progressing   Problem: Education: Goal: Knowledge of the prescribed therapeutic regimen will improve Outcome: Progressing   Problem: Health Behavior/Discharge Planning: Goal: Compliance with therapeutic regimen will improve Outcome: Progressing

## 2022-01-03 NOTE — Progress Notes (Signed)
No distress noted, he denies SI/HI/AVH affect is flat but brightens upon approach, 15 minutes safety check maintained.

## 2022-01-03 NOTE — Progress Notes (Signed)
D: Patient alert and oriented. Patient denies pain. Patient denies anxiety and depression. Patient denies SI/HI/AVH. Patient has been in the dayroom interactive with staff and peers throughout shift. Patient did go outside to the courtyard and play basketball twice when patients were given the opportunity to.  A: Scheduled medications administered to patient, per MD orders.  Support and encouragement provided to patient.  Q15 minute safety checks maintained.   R: Patient compliant with medication administration and treatment plan. No adverse drug reactions noted. Patient remains safe on the unit at this time.

## 2022-01-04 DIAGNOSIS — F251 Schizoaffective disorder, depressive type: Secondary | ICD-10-CM | POA: Diagnosis not present

## 2022-01-04 NOTE — Progress Notes (Signed)
Recreation Therapy Notes  Date: 01/04/2022  Time: 9:50 am    Location: Courtyard     Behavioral response: N/A   Intervention Topic: Strengths   Discussion/Intervention: Patient refused to attend group.   Clinical Observations/Feedback:  Patient refused to attend group.    Cheryal Salas LRT/CTRS         Kristien Salatino 01/04/2022 11:45 AM

## 2022-01-04 NOTE — Plan of Care (Signed)
  Problem: Education: Goal: Knowledge of the prescribed therapeutic regimen will improve Outcome: Progressing   Problem: Activity: Goal: Interest or engagement in leisure activities will improve Outcome: Progressing   Problem: Health Behavior/Discharge Planning: Goal: Compliance with therapeutic regimen will improve Outcome: Progressing   Problem: Role Relationship: Goal: Will demonstrate positive changes in social behaviors and relationships Outcome: Progressing   Problem: Safety: Goal: Ability to disclose and discuss suicidal ideas will improve Outcome: Progressing   Problem: Education: Goal: Knowledge of [place] [facility] Education information/materials will improve Outcome: Progressing Goal: Emotional status will improve Outcome: Progressing Goal: Mental status will improve Outcome: Progressing Goal: Verbalization of understanding the information provided will improve Outcome: Progressing   Problem: Health Behavior/Discharge Planning: Goal: Compliance with treatment plan for underlying cause of condition will improve Outcome: Progressing   Problem: Physical Regulation: Goal: Ability to maintain clinical measurements within normal limits will improve Outcome: Progressing   Problem: Safety: Goal: Periods of time without injury will increase Outcome: Progressing

## 2022-01-04 NOTE — BH IP Treatment Plan (Signed)
Interdisciplinary Treatment and Diagnostic Plan Update  01/04/2022 Time of Session: 8:30 AM Timothy [name] MRN: [id number]  Principal Diagnosis: Schizoaffective disorder, depressive type (HCC)  Secondary Diagnoses: Principal Problem:   Schizoaffective disorder, depressive type (HCC) Active Problems:   Cannabis use disorder, moderate, dependence (HCC)   Homelessness   Current Medications:  Current Facility-Administered Medications  Medication Dose Route Frequency Provider Last Rate Last Admin   acetaminophen (TYLENOL) tablet 650 mg  650 mg Oral Q6H PRN Clapacs, John T, MD   650 mg at 01/03/22 2304   alum & mag hydroxide-simeth (MAALOX/MYLANTA) 200-200-20 MG/5ML suspension 30 mL  30 mL Oral Q4H PRN Clapacs, John T, MD       buPROPion (WELLBUTRIN XL) 24 hr tablet 300 mg  300 mg Oral Daily Clapacs, John T, MD   300 mg at 01/04/22 0831   divalproex (DEPAKOTE) DR tablet 500 mg  500 mg Oral Q12H Clapacs, John T, MD   500 mg at 01/04/22 0831   hydrOXYzine (ATARAX) tablet 50 mg  50 mg Oral TID PRN Clapacs, Jackquline Denmark, MD   50 mg at 01/01/22 0802   ibuprofen (ADVIL) tablet 600 mg  600 mg Oral Q6H PRN Clapacs, John T, MD   600 mg at 12/31/21 0818   LORazepam (ATIVAN) tablet 2 mg  2 mg Oral Q4H PRN Clapacs, John T, MD   2 mg at 01/03/22 2304   Or   LORazepam (ATIVAN) injection 2 mg  2 mg Intramuscular Q4H PRN Clapacs, John T, MD       magnesium hydroxide (MILK OF MAGNESIA) suspension 30 mL  30 mL Oral Daily PRN Clapacs, Jackquline Denmark, MD       neomycin-bacitracin-polymyxin (NEOSPORIN) ointment   Topical PRN Danelle Earthly, Kashif   1 Application at 12/19/21 2112   nicotine (NICODERM CQ - dosed in mg/24 hours) patch 14 mg  14 mg Transdermal Daily Clapacs, Jackquline Denmark, MD   14 mg at 01/04/22 0833   pantoprazole (PROTONIX) EC tablet 40 mg  40 mg Oral Daily Clapacs, John T, MD   40 mg at 01/04/22 0831   QUEtiapine (SEROQUEL) tablet 400 mg  400 mg Oral QHS Malik, Kashif   400 mg at 01/03/22 2100   ziprasidone (GEODON) injection  20 mg  20 mg Intramuscular Q12H PRN Clapacs, John T, MD   20 mg at 12/13/21 1336   PTA Medications: Medications Prior to Admission  Medication Sig Dispense Refill Last Dose   buPROPion (WELLBUTRIN XL) 300 MG 24 hr tablet Take 1 tablet (300 mg total) by mouth daily. 30 tablet 1    FLUoxetine (PROZAC) 20 MG capsule Take 1 capsule (20 mg total) by mouth daily. 30 capsule 1    QUEtiapine (SEROQUEL) 300 MG tablet Take 1 tablet (300 mg total) by mouth at bedtime. 30 tablet 1     Patient Stressors: Financial difficulties   Marital or family conflict   Medication change or noncompliance    Patient Strengths: Motivation for treatment/growth   Treatment Modalities: Medication Management, Group therapy, Case management,  1 to 1 session with clinician, Psychoeducation, Recreational therapy.   Physician Treatment Plan for Primary Diagnosis: Schizoaffective disorder, depressive type (HCC) Long Term Goal(s): Improvement in symptoms so as ready for discharge   Short Term Goals: Compliance with prescribed medications will improve Ability to verbalize feelings will improve Ability to disclose and discuss suicidal ideas Ability to demonstrate self-control will improve  Medication Management: Evaluate patient's response, side effects, and tolerance of medication regimen.  Therapeutic Interventions: 1 to 1 sessions, Unit Group sessions and Medication administration.  Evaluation of Outcomes: Progressing  Physician Treatment Plan for Secondary Diagnosis: Principal Problem:   Schizoaffective disorder, depressive type (HCC) Active Problems:   Cannabis use disorder, moderate, dependence (HCC)   Homelessness  Long Term Goal(s): Improvement in symptoms so as ready for discharge   Short Term Goals: Compliance with prescribed medications will improve Ability to verbalize feelings will improve Ability to disclose and discuss suicidal ideas Ability to demonstrate self-control will improve      Medication Management: Evaluate patient's response, side effects, and tolerance of medication regimen.  Therapeutic Interventions: 1 to 1 sessions, Unit Group sessions and Medication administration.  Evaluation of Outcomes: Progressing   RN Treatment Plan for Primary Diagnosis: Schizoaffective disorder, depressive type (HCC) Long Term Goal(s): Knowledge of disease and therapeutic regimen to maintain health will improve  Short Term Goals: Ability to remain free from injury will improve, Ability to verbalize frustration and anger appropriately will improve, Ability to demonstrate self-control, Ability to participate in decision making will improve, Ability to verbalize feelings will improve, Ability to disclose and discuss suicidal ideas, Ability to identify and develop effective coping behaviors will improve, and Compliance with prescribed medications will improve  Medication Management: RN will administer medications as ordered by provider, will assess and evaluate patient's response and provide education to patient for prescribed medication. RN will report any adverse and/or side effects to prescribing provider.  Therapeutic Interventions: 1 on 1 counseling sessions, Psychoeducation, Medication administration, Evaluate responses to treatment, Monitor vital signs and CBGs as ordered, Perform/monitor CIWA, COWS, AIMS and Fall Risk screenings as ordered, Perform wound care treatments as ordered.  Evaluation of Outcomes: Progressing   LCSW Treatment Plan for Primary Diagnosis: Schizoaffective disorder, depressive type (HCC) Long Term Goal(s): Safe transition to appropriate next level of care at discharge, Engage patient in therapeutic group addressing interpersonal concerns.  Short Term Goals: Engage patient in aftercare planning with referrals and resources, Increase social support, Increase ability to appropriately verbalize feelings, Increase emotional regulation, Facilitate acceptance of  mental health diagnosis and concerns, Facilitate patient progression through stages of change regarding substance use diagnoses and concerns, Identify triggers associated with mental health/substance abuse issues, and Increase skills for wellness and recovery  Therapeutic Interventions: Assess for all discharge needs, 1 to 1 time with Social worker, Explore available resources and support systems, Assess for adequacy in community support network, Educate family and significant other(s) on suicide prevention, Complete Psychosocial Assessment, Interpersonal group therapy.  Evaluation of Outcomes: Progressing   Progress in Treatment: Attending groups: Yes. Participating in groups: Yes. Taking medication as prescribed: Yes. Toleration medication: Yes. Family/Significant other contact made: Yes, individual(s) contacted:  mother, Scarlette Slice. Patient understands diagnosis: Yes. Discussing patient identified problems/goals with staff: Yes. Medical problems stabilized or resolved: Yes. Denies suicidal/homicidal ideation: Yes. Issues/concerns per patient self-inventory: No. Other: none.  New problem(s) identified: No, Describe:  none identified.   New Short Term/Long Term Goal(s): elimination of symptoms of psychosis, medication management for mood stabilization; elimination of SI thoughts; development of comprehensive mental wellness/sobriety plan. Update 12/14/21: No changes at this time. Update 12/21/21: No changes at this time. Update 12/24/21: No changes at this time. Update 12/29/21: No changes at this time. Update 01/04/22: No changes at this time.   Patient Goals:  "I'm just here to get myself together" Update 12/14/21: No changes at this time. Update 12/21/21: No changes at this time. Update 12/24/21: No changes at this time. Update 12/29/21:  No changes at this time. Update 01/04/22: No changes at this time.   Discharge Plan or Barriers: CSW will continue to meet with the patient and development  appropriate discharge plans for the patient. Update 12/14/21: No changes at this time. Update 12/21/21: No changes at this time. Update 12/24/21: No changes at this time. Update 12/29/21:  No changes at this time. Update 01/04/22: Team is trying to connect pt with ACTT services. Pt has upcoming    Reason for Continuation of Hospitalization: Aggression Anxiety Depression Hallucinations Medical Issues Medication stabilization Suicidal ideation   Estimated Length of Stay:  TBD  Last 3 [place] Suicide Severity Risk Score: Flowsheet Row Admission (Current) from 12/07/2021 in [facility] ED from 12/03/2021 in [facility] Admission (Discharged) from 11/01/2021 in [facility]PATIENT BE[facility] MEDICINE  C-SSRS RISK CATEGORY High Risk No Risk No Risk       Last PHQ 2/9 Scores:    12/31/2019   10:13 AM  Depression screen PHQ 2/9  Decreased Interest 2  Down, Depressed, Hopeless 1  PHQ - 2 Score 3  Altered sleeping 3  Tired, decreased energy 3  Trouble concentrating 1  Moving slowly or fidgety/restless 2  Suicidal thoughts 2  PHQ-9 Score 14  Difficult doing work/chores Very difficult    Scribe for Treatment Team: [name], LCSW 01/04/2022 11:11 AM

## 2022-01-04 NOTE — Progress Notes (Signed)
[facility] MD Progress Note  01/04/2022 7:37 AM Timothy [name]  MRN:  [id number]  Subjective: Patient continues to await for placement, awaiting for a response from [facility].  Moderate depression, mild anxiety.  No psychosis.  Sleep "alright", appetite "fair".    Principal Problem: Schizoaffective disorder, depressive type (HCC) Diagnosis: Principal Problem:   Schizoaffective disorder, depressive type (HCC) Active Problems:   Cannabis use disorder, moderate, dependence (HCC)   Homelessness  Total Time spent with patient: 15 minutes  Past Psychiatric History: Past history of recurrent depression with psychosis possible bipolar or schizoaffective disorder  Past Medical History:  Past Medical History:  Diagnosis Date   Cannabis abuse    Paranoid schizophrenia (HCC)    Schizoaffective disorder, depressive type (HCC) 09/17/2014   History reviewed. No pertinent surgical history. Family History:  Family History  Problem Relation Age of Onset   Mental illness Brother    Drug abuse Brother    Mental illness Cousin    Suicidality Cousin    Diabetes Maternal Grandmother    Family Psychiatric  History: See previous Social History:  Social History   Substance and Sexual Activity  Alcohol Use Yes   Alcohol/week: 2.0 standard drinks of alcohol   Types: 2 Cans of beer per week     Social History   Substance and Sexual Activity  Drug Use Yes   Types: Marijuana, MDMA Chiropodist)    Social History   Socioeconomic History   Marital status: Single    Spouse name: Not on file   Number of children: Not on file   Years of education: Not on file   Highest education level: Not on file  Occupational History   Not on file  Tobacco Use   Smoking status: Some Days    Packs/day: 0.25    Types: Cigarettes   Smokeless tobacco: Never  Vaping Use   Vaping Use: Never used  Substance and Sexual Activity   Alcohol use: Yes    Alcohol/week: 2.0 standard drinks of alcohol    Types: 2 Cans of beer  per week   Drug use: Yes    Types: Marijuana, MDMA (Ecstacy)   Sexual activity: Yes  Other Topics Concern   Not on file  Social History Narrative   Not on file   Social Determinants of Health   Financial Resource Strain: Low Risk  (12/31/2019)   Overall Financial Resource Strain (CARDIA)    Difficulty of Paying Living Expenses: Not hard at all  Food Insecurity: No Food Insecurity (12/31/2019)   Hunger Vital Sign    Worried About Running Out of Food in the Last Year: Never true    Ran Out of Food in the Last Year: Never true  Transportation Needs: No Transportation Needs (12/31/2019)   PRAPARE - Administrator, Civil Service (Medical): No    Lack of Transportation (Non-Medical): No  Physical Activity: Inactive (12/31/2019)   Exercise Vital Sign    Days of Exercise per Week: 0 days    Minutes of Exercise per Session: 0 min  Stress: Stress Concern Present (12/31/2019)   [employer] of Occupational Health - Occupational Stress Questionnaire    Feeling of Stress : Rather much  Social Connections: Moderately Isolated (12/31/2019)   Social Connection and Isolation Panel [NHANES]    Frequency of Communication with Friends and Family: Once a week    Frequency of Social Gatherings with Friends and Family: Once a week    Attends Religious Services: More than 4 times  per year    Active Member of Clubs or Organizations: Yes    Attends Banker Meetings: More than 4 times per year    Marital Status: Never married   Additional Social History:   Sleep: Fair  Appetite:  Fair  Current Medications: Current Facility-Administered Medications  Medication Dose Route Frequency Provider Last Rate Last Admin   acetaminophen (TYLENOL) tablet 650 mg  650 mg Oral Q6H PRN Clapacs, John T, MD   650 mg at 01/03/22 2304   alum & mag hydroxide-simeth (MAALOX/MYLANTA) 200-200-20 MG/5ML suspension 30 mL  30 mL Oral Q4H PRN Clapacs, John T, MD       buPROPion (WELLBUTRIN XL) 24 hr  tablet 300 mg  300 mg Oral Daily Clapacs, John T, MD   300 mg at 01/03/22 0941   divalproex (DEPAKOTE) DR tablet 500 mg  500 mg Oral Q12H Clapacs, John T, MD   500 mg at 01/03/22 2000   hydrOXYzine (ATARAX) tablet 50 mg  50 mg Oral TID PRN Clapacs, Jackquline Denmark, MD   50 mg at 01/01/22 0802   ibuprofen (ADVIL) tablet 600 mg  600 mg Oral Q6H PRN Clapacs, John T, MD   600 mg at 12/31/21 0818   LORazepam (ATIVAN) tablet 2 mg  2 mg Oral Q4H PRN Clapacs, John T, MD   2 mg at 01/03/22 2304   Or   LORazepam (ATIVAN) injection 2 mg  2 mg Intramuscular Q4H PRN Clapacs, John T, MD       magnesium hydroxide (MILK OF MAGNESIA) suspension 30 mL  30 mL Oral Daily PRN Clapacs, Jackquline Denmark, MD       neomycin-bacitracin-polymyxin (NEOSPORIN) ointment   Topical PRN Danelle Earthly, Kashif   1 Application at 12/19/21 2112   nicotine (NICODERM CQ - dosed in mg/24 hours) patch 14 mg  14 mg Transdermal Daily Clapacs, Jackquline Denmark, MD   14 mg at 01/03/22 0943   pantoprazole (PROTONIX) EC tablet 40 mg  40 mg Oral Daily Clapacs, John T, MD   40 mg at 01/03/22 0942   QUEtiapine (SEROQUEL) tablet 400 mg  400 mg Oral QHS Malik, Kashif   400 mg at 01/03/22 2100   ziprasidone (GEODON) injection 20 mg  20 mg Intramuscular Q12H PRN Clapacs, Jackquline Denmark, MD   20 mg at 12/13/21 1336    Lab Results: No results found for this or any previous visit (from the past 48 hour(s)).  Blood Alcohol level:  Lab Results  Component Value Date   ETH <10 12/03/2021   ETH <10 10/31/2021    Metabolic Disorder Labs: Lab Results  Component Value Date   HGBA1C 5.5 11/04/2021   MPG 111.15 11/04/2021   MPG 114 06/17/2021   Lab Results  Component Value Date   PROLACTIN 33.2 (H) 08/15/2016   Lab Results  Component Value Date   CHOL 149 11/04/2021   TRIG 56 11/04/2021   HDL 35 (L) 11/04/2021   CHOLHDL 4.3 11/04/2021   VLDL 11 11/04/2021   LDLCALC 103 (H) 11/04/2021   LDLCALC 111 (H) 06/17/2021    Physical Findings: AIMS: Facial and Oral Movements Muscles of  Facial Expression: None, normal Lips and Perioral Area: None, normal Jaw: None, normal Tongue: None, normal,Extremity Movements Upper (arms, wrists, hands, fingers): None, normal Lower (legs, knees, ankles, toes): None, normal, Trunk Movements Neck, shoulders, hips: None, normal, Overall Severity Severity of abnormal movements (highest score from questions above): None, normal Incapacitation due to abnormal movements: None, normal Patient's awareness of  abnormal movements (rate only patient's report): No Awareness, Dental Status Current problems with teeth and/or dentures?: No Does patient usually wear dentures?: No   Musculoskeletal: Strength & Muscle Tone: within normal limits Gait & Station: normal Patient leans: N/A  Psychiatric Specialty Exam: Physical Exam Vitals and nursing note reviewed.  Constitutional:      Appearance: Normal appearance.  HENT:     Head: Normocephalic.  Pulmonary:     Effort: Pulmonary effort is normal.  Musculoskeletal:        General: Normal range of motion.  Neurological:     General: No focal deficit present.     Mental Status: He is alert and oriented to person, place, and time. Mental status is at baseline.  Psychiatric:        Attention and Perception: Attention normal.        Mood and Affect: Mood is anxious and depressed.        Speech: Speech normal.        Behavior: Behavior is cooperative.        Thought Content: Thought content normal.        Cognition and Memory: Cognition and memory normal.        Judgment: Judgment normal.     Review of Systems  Constitutional: Negative.   HENT: Negative.    Eyes: Negative.   Respiratory: Negative.    Cardiovascular: Negative.   Gastrointestinal: Negative.   Musculoskeletal: Negative.   Skin: Negative.   Neurological: Negative.   Psychiatric/Behavioral:  Positive for depression. The patient is nervous/anxious.     Blood pressure 138/72, pulse 81, temperature 98 F (36.7 C), temperature  source Oral, resp. rate 18, height 6\' 3"  (1.905 m), weight 103.9 kg, SpO2 100 %.Body mass index is 28.62 kg/m.  General Appearance: Casual  Eye Contact:  Good  Speech:  Normal Rate  Volume:  Normal  Mood:  Anxious and Depressed  Affect:  Congruent  Thought Process:  Coherent and Descriptions of Associations: Intact  Orientation:  Full (Time, Place, and Person)  Thought Content:  WDL and Logical  Suicidal Thoughts:  No  Homicidal Thoughts:  No  Memory:  Immediate;   Good Recent;   Good Remote;   Good  Judgement:  Fair  Insight:  Fair  Psychomotor Activity:  Normal  Concentration:  Concentration: Good and Attention Span: Good  Recall:  Good  Fund of Knowledge:  Good  Language:  Good  Akathisia:  No  Handed:  Right  AIMS (if indicated):     Assets:  Leisure Time Physical Health Resilience  ADL's:  Intact  Cognition:  WNL  Sleep:         Physical Exam: Physical Exam Vitals and nursing note reviewed.  Constitutional:      Appearance: Normal appearance.  HENT:     Head: Normocephalic.  Pulmonary:     Effort: Pulmonary effort is normal.  Musculoskeletal:        General: Normal range of motion.  Neurological:     General: No focal deficit present.     Mental Status: He is alert and oriented to person, place, and time. Mental status is at baseline.  Psychiatric:        Attention and Perception: Attention normal.        Mood and Affect: Mood is anxious and depressed.        Speech: Speech normal.        Behavior: Behavior is cooperative.  Thought Content: Thought content normal.        Cognition and Memory: Cognition and memory normal.        Judgment: Judgment normal.    Review of Systems  Constitutional: Negative.   HENT: Negative.    Eyes: Negative.   Respiratory: Negative.    Cardiovascular: Negative.   Gastrointestinal: Negative.   Musculoskeletal: Negative.   Skin: Negative.   Neurological: Negative.   Psychiatric/Behavioral:  Positive for  depression. The patient is nervous/anxious.    Blood pressure 138/72, pulse 81, temperature 98 F (36.7 C), temperature source Oral, resp. rate 18, height 6\' 3"  (1.905 m), weight 103.9 kg, SpO2 100 %. Body mass index is 28.62 kg/m.   Treatment Plan Summary: Medication management and Plan no change to medication.  Anxiety primarily situational.  Psychoeducation and support.  Patient seems to be looking into some possible options for discharge over the next week or so. Schizoaffective disorder, depressed type: Wellbutrin 300 mg daily Depakote 500 mg BID Seroquel 400 mg daily  Anxiety: Hydroxyzine 50 mg TID PRN  , NP 01/04/2022, 7:37 AM

## 2022-01-04 NOTE — Care Plan (Signed)
Timothy [name] MHT/NAII and writer of note:  upon doing 1845-1900 rounding asked if pt was ok and pt told MHT that he did not feel well. Pt said that his chest in the middle hurt. MHT ask pt to describe pain and pt said "it feels like a knot". Pt laying in bed at this time.  MHT reported to RNs in RN station. Noted that pt has HX of acid reflux and would try some coordinating meds then a possible order for EKG.

## 2022-01-04 NOTE — Progress Notes (Signed)
D: Patient alert and oriented. Patient denies pain. Patient denies depression. Patient rates anxiety 6/10.  Patient denies SI/HI/VH. Patient endorses auditory hallucinations stating he hears "small whispers." Patient has been less isolative during the morning. 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 Patient has been interacting with peers throughout the unit during shift.   A: Scheduled medications administered to patient, per MD orders.  Support and encouragement provided to patient.  Q15 minute safety checks maintained.   R: Patient compliant with medication administration and treatment plan. No adverse drug reactions noted. Patient remains safe on the unit at this time.

## 2022-01-04 NOTE — Group Note (Signed)
[facility] LCSW Group Therapy Note    Group Date: 01/04/2022 Start Time: 1300 End Time: 1400  Type of Therapy and Topic:  Group Therapy:  Overcoming Obstacles  Participation Level:  [facility] PARTICIPATION LEVEL: Minimal   Description of Group:   In this group patients will be encouraged to explore what they see as obstacles to their own wellness and recovery. They will be guided to discuss their thoughts, feelings, and behaviors related to these obstacles. The group will process together ways to cope with barriers, with attention given to specific choices patients can make. Each patient will be challenged to identify changes they are motivated to make in order to overcome their obstacles. This group will be process-oriented, with patients participating in exploration of their own experiences as well as giving and receiving support and challenge from other group members.  Therapeutic Goals: 1. Patient will identify personal and current obstacles as they relate to admission. 2. Patient will identify barriers that currently interfere with their wellness or overcoming obstacles.  3. Patient will identify feelings, thought process and behaviors related to these barriers. 4. Patient will identify two changes they are willing to make to overcome these obstacles:    Summary of Patient Progress Patient was present for the entirety of group. He was engaged in the conversation but mostly when asked a question directly. Pt identified lack of structure as an obstacle which he needs to overcome. He stated that his own frustration with himself and the situation is a major barrier to overcoming his obstacles. Pt expressed moving forward he will try his best and practice self-care (not giving so much of himself to others) to address his barriers/obstacles. Connecting with a support group, attending church, getting enough sleep, and playing video games were names as some of the  things he has in his toolbox. Pt was open and receptive to feedback and comments from facilitator and peer as well.    Therapeutic Modalities:   Cognitive Behavioral Therapy Solution Focused Therapy Motivational Interviewing Relapse Prevention Therapy   [name], LCSW

## 2022-01-05 DIAGNOSIS — F251 Schizoaffective disorder, depressive type: Secondary | ICD-10-CM | POA: Diagnosis not present

## 2022-01-05 NOTE — Progress Notes (Signed)
[facility] MD Progress Note  01/05/2022 9:10 AM Timothy [name]  MRN:  [id number]  Subjective: Patient continues to await for placement.  He has applied to [facility] and they will have a bed for him in November.  Meanwhile, he will meet with his potential ACT team, [facility], tomorrow and discharge to a shelter.  He has minimal depression and anxiety, predominately around being homeless.  Timothy [name] is pleasant with no distress, interacting appropriately on the unit in the day room.  No appetite or sleep issues.  Caveat:  He was at [facility] for a month and discharged, return to the ED in about one day and has been here again for another month.  Principal Problem: Schizoaffective disorder, depressive type (HCC) Diagnosis: Principal Problem:   Schizoaffective disorder, depressive type (HCC) Active Problems:   Cannabis use disorder, moderate, dependence (HCC)   Homelessness  Total Time spent with patient: 15 minutes  Past Psychiatric History: Past history of recurrent depression with psychosis possible bipolar or schizoaffective disorder  Past Medical History:  Past Medical History:  Diagnosis Date   Cannabis abuse    Paranoid schizophrenia (HCC)    Schizoaffective disorder, depressive type (HCC) 09/17/2014   History reviewed. No pertinent surgical history. Family History:  Family History  Problem Relation Age of Onset   Mental illness Brother    Drug abuse Brother    Mental illness Cousin    Suicidality Cousin    Diabetes Maternal Grandmother    Family Psychiatric  History: See previous Social History:  Social History   Substance and Sexual Activity  Alcohol Use Yes   Alcohol/week: 2.0 standard drinks of alcohol   Types: 2 Cans of beer per week     Social History   Substance and Sexual Activity  Drug Use Yes   Types: Marijuana, MDMA Chiropodist)    Social History   Socioeconomic History   Marital status: Single    Spouse name: Not on file   Number of children: Not on file   Years of  education: Not on file   Highest education level: Not on file  Occupational History   Not on file  Tobacco Use   Smoking status: Some Days    Packs/day: 0.25    Types: Cigarettes   Smokeless tobacco: Never  Vaping Use   Vaping Use: Never used  Substance and Sexual Activity   Alcohol use: Yes    Alcohol/week: 2.0 standard drinks of alcohol    Types: 2 Cans of beer per week   Drug use: Yes    Types: Marijuana, MDMA (Ecstacy)   Sexual activity: Yes  Other Topics Concern   Not on file  Social History Narrative   Not on file   Social Determinants of Health   Financial Resource Strain: Low Risk  (12/31/2019)   Overall Financial Resource Strain (CARDIA)    Difficulty of Paying Living Expenses: Not hard at all  Food Insecurity: No Food Insecurity (12/31/2019)   Hunger Vital Sign    Worried About Running Out of Food in the Last Year: Never true    Ran Out of Food in the Last Year: Never true  Transportation Needs: No Transportation Needs (12/31/2019)   PRAPARE - Administrator, Civil Service (Medical): No    Lack of Transportation (Non-Medical): No  Physical Activity: Inactive (12/31/2019)   Exercise Vital Sign    Days of Exercise per Week: 0 days    Minutes of Exercise per Session: 0 min  Stress: Stress Concern  Present (12/31/2019)   [employer] of Occupational Health - Occupational Stress Questionnaire    Feeling of Stress : Rather much  Social Connections: Moderately Isolated (12/31/2019)   Social Connection and Isolation Panel [NHANES]    Frequency of Communication with Friends and Family: Once a week    Frequency of Social Gatherings with Friends and Family: Once a week    Attends Religious Services: More than 4 times per year    Active Member of [employer] or Organizations: Yes    Attends Engineer, structural: More than 4 times per year    Marital Status: Never married   Additional Social History:   Sleep: Fair  Appetite:  Fair  Current  Medications: Current Facility-Administered Medications  Medication Dose Route Frequency Provider Last Rate Last Admin   acetaminophen (TYLENOL) tablet 650 mg  650 mg Oral Q6H PRN Clapacs, John T, MD   650 mg at 01/05/22 0815   alum & mag hydroxide-simeth (MAALOX/MYLANTA) 200-200-20 MG/5ML suspension 30 mL  30 mL Oral Q4H PRN Clapacs, John T, MD       buPROPion (WELLBUTRIN XL) 24 hr tablet 300 mg  300 mg Oral Daily Clapacs, John T, MD   300 mg at 01/05/22 0814   divalproex (DEPAKOTE) DR tablet 500 mg  500 mg Oral Q12H Clapacs, John T, MD   500 mg at 01/05/22 3790   hydrOXYzine (ATARAX) tablet 50 mg  50 mg Oral TID PRN Clapacs, Jackquline Denmark, MD   50 mg at 01/04/22 1821   ibuprofen (ADVIL) tablet 600 mg  600 mg Oral Q6H PRN Clapacs, John T, MD   600 mg at 12/31/21 0818   LORazepam (ATIVAN) tablet 2 mg  2 mg Oral Q4H PRN Clapacs, John T, MD   2 mg at 01/03/22 2304   Or   LORazepam (ATIVAN) injection 2 mg  2 mg Intramuscular Q4H PRN Clapacs, John T, MD       magnesium hydroxide (MILK OF MAGNESIA) suspension 30 mL  30 mL Oral Daily PRN Clapacs, Jackquline Denmark, MD       neomycin-bacitracin-polymyxin (NEOSPORIN) ointment   Topical PRN Danelle Earthly, Kashif   1 Application at 12/19/21 2112   nicotine (NICODERM CQ - dosed in mg/24 hours) patch 14 mg  14 mg Transdermal Daily Clapacs, John T, MD   14 mg at 01/05/22 0815   pantoprazole (PROTONIX) EC tablet 40 mg  40 mg Oral Daily Clapacs, John T, MD   40 mg at 01/05/22 0814   QUEtiapine (SEROQUEL) tablet 400 mg  400 mg Oral QHS Malik, Kashif   400 mg at 01/04/22 2129   ziprasidone (GEODON) injection 20 mg  20 mg Intramuscular Q12H PRN Clapacs, Jackquline Denmark, MD   20 mg at 12/13/21 1336    Lab Results: No results found for this or any previous visit (from the past 48 hour(s)).  Blood Alcohol level:  Lab Results  Component Value Date   ETH <10 12/03/2021   ETH <10 10/31/2021    Metabolic Disorder Labs: Lab Results  Component Value Date   HGBA1C 5.5 11/04/2021   MPG 111.15  11/04/2021   MPG 114 06/17/2021   Lab Results  Component Value Date   PROLACTIN 33.2 (H) 08/15/2016   Lab Results  Component Value Date   CHOL 149 11/04/2021   TRIG 56 11/04/2021   HDL 35 (L) 11/04/2021   CHOLHDL 4.3 11/04/2021   VLDL 11 11/04/2021   LDLCALC 103 (H) 11/04/2021   LDLCALC 111 (H) 06/17/2021  Physical Findings: AIMS: Facial and Oral Movements Muscles of Facial Expression: None, normal Lips and Perioral Area: None, normal Jaw: None, normal Tongue: None, normal,Extremity Movements Upper (arms, wrists, hands, fingers): None, normal Lower (legs, knees, ankles, toes): None, normal, Trunk Movements Neck, shoulders, hips: None, normal, Overall Severity Severity of abnormal movements (highest score from questions above): None, normal Incapacitation due to abnormal movements: None, normal Patient's awareness of abnormal movements (rate only patient's report): No Awareness, Dental Status Current problems with teeth and/or dentures?: No Does patient usually wear dentures?: No   Musculoskeletal: Strength & Muscle Tone: within normal limits Gait & Station: normal Patient leans: N/A  Psychiatric Specialty Exam: Physical Exam Vitals and nursing note reviewed.  Constitutional:      Appearance: Normal appearance.  HENT:     Head: Normocephalic.  Pulmonary:     Effort: Pulmonary effort is normal.  Musculoskeletal:        General: Normal range of motion.  Neurological:     General: No focal deficit present.     Mental Status: He is alert and oriented to person, place, and time. Mental status is at baseline.  Psychiatric:        Attention and Perception: Attention normal.        Mood and Affect: Mood is anxious and depressed.        Speech: Speech normal.        Behavior: Behavior is cooperative.        Thought Content: Thought content normal.        Cognition and Memory: Cognition and memory normal.        Judgment: Judgment normal.     Review of Systems   Constitutional: Negative.   HENT: Negative.    Eyes: Negative.   Respiratory: Negative.    Cardiovascular: Negative.   Gastrointestinal: Negative.   Musculoskeletal: Negative.   Skin: Negative.   Neurological: Negative.   Psychiatric/Behavioral:  Positive for depression. The patient is nervous/anxious.     Blood pressure 133/85, pulse 82, temperature 98.4 F (36.9 C), temperature source Oral, resp. rate 18, height 6\' 3"  (1.905 m), weight 103.9 kg, SpO2 100 %.Body mass index is 28.62 kg/m.  General Appearance: Casual  Eye Contact:  Good  Speech:  Normal Rate  Volume:  Normal  Mood:  Anxious and Depressed  Affect:  Congruent  Thought Process:  Coherent and Descriptions of Associations: Intact  Orientation:  Full (Time, Place, and Person)  Thought Content:  WDL and Logical  Suicidal Thoughts:  No  Homicidal Thoughts:  No  Memory:  Immediate;   Good Recent;   Good Remote;   Good  Judgement:  Fair  Insight:  Fair  Psychomotor Activity:  Normal  Concentration:  Concentration: Good and Attention Span: Good  Recall:  Good  Fund of Knowledge:  Good  Language:  Good  Akathisia:  No  Handed:  Right  AIMS (if indicated):     Assets:  Leisure Time Physical Health Resilience  ADL's:  Intact  Cognition:  WNL  Sleep:         Physical Exam: Physical Exam Vitals and nursing note reviewed.  Constitutional:      Appearance: Normal appearance.  HENT:     Head: Normocephalic.  Pulmonary:     Effort: Pulmonary effort is normal.  Musculoskeletal:        General: Normal range of motion.  Neurological:     General: No focal deficit present.     Mental Status: He is  alert and oriented to person, place, and time. Mental status is at baseline.  Psychiatric:        Attention and Perception: Attention normal.        Mood and Affect: Mood is anxious and depressed.        Speech: Speech normal.        Behavior: Behavior is cooperative.        Thought Content: Thought content  normal.        Cognition and Memory: Cognition and memory normal.        Judgment: Judgment normal.    Review of Systems  Constitutional: Negative.   HENT: Negative.    Eyes: Negative.   Respiratory: Negative.    Cardiovascular: Negative.   Gastrointestinal: Negative.   Musculoskeletal: Negative.   Skin: Negative.   Neurological: Negative.   Psychiatric/Behavioral:  Positive for depression. The patient is nervous/anxious.    Blood pressure 133/85, pulse 82, temperature 98.4 F (36.9 C), temperature source Oral, resp. rate 18, height 6\' 3"  (1.905 m), weight 103.9 kg, SpO2 100 %. Body mass index is 28.62 kg/m.   Treatment Plan Summary: Medication management and Plan no change to medication.  Anxiety primarily situational.  Psychoeducation and support.  Patient seems to be looking into some possible options for discharge over the next week or so. Schizoaffective disorder, depressed type: Wellbutrin 300 mg daily Depakote 500 mg BID Seroquel 400 mg daily  Anxiety: Hydroxyzine 50 mg TID PRN  Waylan Boga, NP 01/05/2022, 9:10 AM

## 2022-01-05 NOTE — Plan of Care (Signed)
D: Pt alert and oriented. Pt rates depression 7/10, hopelessness 0/10, and anxiety 0/10. Pt reports experiencing 7/10 lateral chx  pain in side area at this time, prn meds given, MD notified and aware. Pt denies experiencing any SI/HI, or AVH at this time.   A: Scheduled medications administered to pt, per MD orders. Support and encouragement provided. Frequent verbal contact made. Routine safety checks conducted q15 minutes.   R: No adverse drug reactions noted. Pt verbally contracts for safety at this time. Pt compliant with medications and treatment plan. Pt interacts well with others on the unit. Pt remains safe at this time. Will continue to monitor.   Problem: Activity: Goal: Will verbalize the importance of balancing activity with adequate rest periods Outcome: Progressing   Problem: Education: Goal: Knowledge of the prescribed therapeutic regimen will improve Outcome: Progressing

## 2022-01-05 NOTE — Group Note (Signed)
LCSW Group Therapy Note   Group Date: 01/05/2022 Start Time: 1300 End Time: 1400   Type of Therapy and Topic:  Group Therapy: Boundaries  Participation Level:  Active  Description of Group: This group will address the use of boundaries in their personal lives. Patients will explore why boundaries are important, the difference between healthy and unhealthy boundaries, and negative and postive outcomes of different boundaries and will look at how boundaries can be crossed.  Patients will be encouraged to identify current boundaries in their own lives and identify what kind of boundary is being set. Facilitators will guide patients in utilizing problem-solving interventions to address and correct types boundaries being used and to address when no boundary is being used. Understanding and applying boundaries will be explored and addressed for obtaining and maintaining a balanced life. Patients will be encouraged to explore ways to assertively make their boundaries and needs known to significant others in their lives, using other group members and facilitator for role play, support, and feedback.  Therapeutic Goals:  1.  Patient will identify areas in their life where setting clear boundaries could be  used to improve their life.  2.  Patient will identify signs/triggers that a boundary is not being respected. 3.  Patient will identify two ways to set boundaries in order to achieve balance in  their lives: 4.  Patient will demonstrate ability to communicate their needs and set boundaries  through discussion and/or role plays  Summary of Patient Progress:   Patient was present for the entirety of the group session. Patient was an active listener and participated in the topic of discussion, provided helpful advice to others, and added nuance to topic of conversation. Patient shared he can be guarded at times regarding physical space and emotional boundaries. States that he tends to have rigid  boundaries in general.   Therapeutic Modalities:   Cognitive Behavioral Therapy Solution-Focused Therapy  Almedia Balls 01/05/2022  2:57 PM

## 2022-01-06 ENCOUNTER — Other Ambulatory Visit: Payer: Self-pay

## 2022-01-06 DIAGNOSIS — F251 Schizoaffective disorder, depressive type: Secondary | ICD-10-CM | POA: Diagnosis not present

## 2022-01-06 MED ORDER — HYDROXYZINE HCL 50 MG PO TABS
50.0000 mg | ORAL_TABLET | Freq: Three times a day (TID) | ORAL | 0 refills | Status: AC | PRN
  Filled 2022-01-06: qty 10, 4d supply, fill #0

## 2022-01-06 MED ORDER — BUPROPION HCL ER (XL) 300 MG PO TB24
300.0000 mg | ORAL_TABLET | Freq: Every day | ORAL | 0 refills | Status: DC
  Filled 2022-01-06: qty 10, 10d supply, fill #0

## 2022-01-06 MED ORDER — QUETIAPINE FUMARATE 400 MG PO TABS: 400.0000 mg | ORAL_TABLET | Freq: Every day | ORAL | 0 refills | Status: DC

## 2022-01-06 MED ORDER — DIVALPROEX SODIUM 500 MG PO DR TAB
500.0000 mg | DELAYED_RELEASE_TABLET | Freq: Two times a day (BID) | ORAL | 0 refills | Status: DC
  Filled 2022-01-06: qty 10, 5d supply, fill #0

## 2022-01-06 MED ORDER — DIVALPROEX SODIUM 500 MG PO DR TAB: 500.0000 mg | DELAYED_RELEASE_TABLET | Freq: Two times a day (BID) | ORAL | 1 refills | Status: DC

## 2022-01-06 MED ORDER — HYDROXYZINE HCL 50 MG PO TABS: 50.0000 mg | ORAL_TABLET | Freq: Three times a day (TID) | ORAL | 0 refills | Status: DC | PRN

## 2022-01-06 MED ORDER — QUETIAPINE FUMARATE 400 MG PO TABS
400.0000 mg | ORAL_TABLET | Freq: Every day | ORAL | 0 refills | Status: DC
  Filled 2022-01-06: qty 10, 10d supply, fill #0

## 2022-01-06 NOTE — Plan of Care (Signed)
  Problem: Decision Making Goal: STG - Patient will successfully identify 2 ways of making healthy decisions post d/c within 5 recreation therapy group sessions Description: STG - Patient will successfully identify 2 ways of making healthy decisions post d/c within 5 recreation therapy group sessions Outcome: Completed/Met

## 2022-01-06 NOTE — Progress Notes (Incomplete)
D: Pt alert and oriented. Pt denies experiencing any pain, SI/HI, or AVH at this time. Pt reports he will be able to keep himself safe when he returns home.   A: Pt received discharge and medication education/information. Pt belongings were returned and signed for at this time.   R: Pt verbalized understanding of discharge and medication education/information.  Pt escorted by staff to medical mall front lobby where pt was picked up by

## 2022-01-06 NOTE — Progress Notes (Signed)
Patient has been isolative to his room this evening. Reports not having a good day today. Endorses high rate of depression.  Reports not being able to get into the program until November and now not knowing what to do until then. Support and encouragement offered and ideas suggested such as telling his family about the program and asking can he stay until then. Does not think it will work, but will try. Med compliant and received his meds without incident.  Tylenol given for chest pain. Will continue to monitor with q15 minute safety checks.  Encouraged him to seek staff with any concerns.     C Butler-Nicholson, LPN

## 2022-01-06 NOTE — [facility] Group Notes (Signed)
[facility] Group Notes:  (Nursing/MHT/Case Management/Adjunct)  Date:  01/06/2022  Time:  9:13 AM  Type of Therapy:   Community Meeting  Participation Level:  Active  Participation Quality:  Appropriate and Attentive  Affect:  Appropriate  Cognitive:  Alert and Appropriate  Insight:  Appropriate  Engagement in Group:  Engaged  Modes of Intervention:  Discussion and Support  Summary of Progress/Problems:  [name] 01/06/2022, 9:13 AM

## 2022-01-06 NOTE — [facility] Counselor (Signed)
CSW assisted patient in completing interview with [facility], Timothy [name], [phone] or [phone].  Meeting went well and patient was appropriate throughout.  Katie reports that someone from the ACTT team will follow up at the end of this week beginning of next week at the shelter.   CSW observed Timothy [name] to review information on an ACTT team and complete a CCA with patient.   CSW informed that patient will have in his discharge packet information on the [facility], [facility] Resources, [facility] as a part of his discharge packet.  Katie requested that CSW email date of admissions to [facility] and medications to [email or web address].  Penni Homans, MSW, LCSW 01/06/2022 2:11 PM

## 2022-01-06 NOTE — Progress Notes (Signed)
Patient at the nurses station requesting Tylenol for Left side pain near rib cage. Rates pain at 7/10 had reported it at 4/10 a few minutes earlier.  PRN medication provided.  Will continue to monitor.  Advised him to come to staff with any changes.     C Butler-Nicholson, LPN

## 2022-01-06 NOTE — Progress Notes (Signed)
Recreation Therapy Notes   Date: 01/06/2022  Time: 10:15 am     Location: Craft room     Behavioral response: N/A   Intervention Topic: Time Management    Discussion/Intervention: Patient refused to attend group.   Clinical Observations/Feedback:  Patient refused to attend group.    Jamarkis Branam LRT/CTRS        Naszir Cott 01/06/2022 12:52 PM

## 2022-01-06 NOTE — Plan of Care (Signed)
D: Pt alert and oriented. Pt rates depression 7/10 and anxiety 6/10. Pt reports experiencing 8/10 chx pain at this time, prns given and MD notified. Pt finds prns to be helpful. Pt denies experiencing any SI/HI, or VH at this time however endorses experiencing AH. Pt states he hears small whispers. Pt states he can not make out what they are saying.   A: Scheduled medications administered to pt, per MD orders. Support and encouragement provided. Frequent verbal contact made. Routine safety checks conducted q15 minutes.   R: No adverse drug reactions noted. Pt verbally contracts for safety at this time. Pt compliant with medications. Pt interacts well with others on the unit. Pt remains safe at this time. Will continue to monitor.   Problem: Activity: Goal: Will verbalize the importance of balancing activity with adequate rest periods Outcome: Progressing   Problem: Education: Goal: Will be free of psychotic symptoms Outcome: Progressing

## 2022-01-06 NOTE — Progress Notes (Signed)
  [facility] Adult Case Management Discharge Plan :  Will you be returning to the same living situation after discharge:  No. At discharge, do you have transportation home?: Yes,  CSW to assist with transportation needs. Do you have the ability to pay for your medications: No.  Release of information consent forms completed and in the chart;  Patient's signature needed at discharge.  Patient to Follow up at:  Follow-up Information     [facility] Follow up.   Why: [facility] has services to address mental health, dental, primary care physician and pharmacy needs.  Information has been placed in your discharge packet.  Have to be established with [employer] to begin services. Contact information: [address] [address] [place], [place] [address] [phone]        [facility] Follow up.   Specialty: Behavioral Health Why: Walk in hours start at 8:30AM  Monday through Friday. First come first serve basis so arrive early. Contact information: [address] [place] [address] [phone]                Next level of care provider has access to [facility] Link:no  Safety Planning and Suicide Prevention discussed: Yes,  SPE completed with the patient.     Has patient been referred to the Quitline?: Patient refused referral  Patient has been referred for addiction treatment: Pt. refused referral  Harden Mo, LCSW 01/06/2022, 2:44 PM

## 2022-01-06 NOTE — Group Note (Signed)
[facility] LCSW Group Therapy Note   Group Date: 01/06/2022 Start Time: 1300 End Time: 1400   Type of Therapy/Topic:  Group Therapy:  Emotion Regulation  Participation Level:  Did Not Attend    Description of Group:    The purpose of this group is to assist patients in learning to regulate negative emotions and experience positive emotions. Patients will be guided to discuss ways in which they have been vulnerable to their negative emotions. These vulnerabilities will be juxtaposed with experiences of positive emotions or situations, and patients challenged to use positive emotions to combat negative ones. Special emphasis will be placed on coping with negative emotions in conflict situations, and patients will process healthy conflict resolution skills.  Therapeutic Goals: Patient will identify two positive emotions or experiences to reflect on in order to balance out negative emotions:  Patient will label two or more emotions that they find the most difficult to experience:  Patient will be able to demonstrate positive conflict resolution skills through discussion or role plays:   Summary of Patient Progress: Pt did not attend group despite invitation.   Therapeutic Modalities:   Cognitive Behavioral Therapy Feelings Identification Dialectical Behavioral Therapy   Ladonna Vanorder R Dalayna Lauter, LCSW 

## 2022-01-06 NOTE — Plan of Care (Signed)
  Problem: Activity: Goal: Will verbalize the importance of balancing activity with adequate rest periods Outcome: Progressing   Problem: Education: Goal: Will be free of psychotic symptoms Outcome: Progressing Goal: Knowledge of the prescribed therapeutic regimen will improve Outcome: Progressing   Problem: Coping: Goal: Coping ability will improve Outcome: Progressing Goal: Will verbalize feelings Outcome: Progressing   Problem: Health Behavior/Discharge Planning: Goal: Compliance with prescribed medication regimen will improve Outcome: Progressing   Problem: Coping: Goal: Level of anxiety will decrease Outcome: Progressing   Problem: Nutrition: Goal: Adequate nutrition will be maintained Outcome: Progressing   Problem: Activity: Goal: Risk for activity intolerance will decrease Outcome: Progressing

## 2022-01-06 NOTE — B[facility] Counselor (Signed)
CSW has provided patient with [facility] resources, [facility] resources and information on the [facility] in [place].  CSW reviewed with the patient the expectations of the shelter including religious requirements, medication compliance, expectation to work, need for ID (facesheet and copy of chart photo as pt does not have his).  Penni Homans, MSW, LCSW 01/06/2022 4:23 PM

## 2022-01-06 NOTE — Progress Notes (Signed)
Recreation Therapy Notes  INPATIENT RECREATION TR PLAN  Patient Details Name: Timothy [name] MRN: [id number] DOB: [birth year] Today's Date: 01/06/2022  Rec Therapy Plan Is patient appropriate for Therapeutic Recreation?: Yes Treatment times per week: At least 3 Estimated Length of Stay: 5-7 days TR Treatment/Interventions: Group participation (Comment)  Discharge Criteria Pt will be discharged from therapy if:: Discharged Treatment plan/goals/alternatives discussed and agreed upon by:: Patient/family  Discharge Summary Short term goals set: Patient will successfully identify 2 ways of making healthy decisions post d/c within 5 recreation therapy group sessions Short term goals met: Complete Progress toward goals comments: Groups attended Which groups?: Social skills, Coping skills, Leisure education, Wellness, Other (Comment) (Time management, self-care, problem solving) Reason goals not met: N/A Therapeutic equipment acquired: N/A Reason patient discharged from therapy: Discharge from hospital Pt/family agrees with progress & goals achieved: Yes Date patient discharged from therapy: 01/06/22   Elhadji Pecore 01/06/2022, 2:41 PM

## 2022-01-06 NOTE — [facility] Suicide Risk Assessment (Addendum)
Suicide Risk Assessment  [facility] Discharge Suicide Risk Assessment   Principal Problem: Schizoaffective disorder, depressive type (HCC) Discharge Diagnoses: Principal Problem:   Schizoaffective disorder, depressive type (HCC) Active Problems:   Cannabis use disorder, moderate, dependence (HCC)   Homelessness   Total Time spent with patient: 45 minutes  Musculoskeletal: Strength & Muscle Tone: within normal limits Gait & Station: normal Patient leans: N/A  Psychiatric Specialty Exam: Physical Exam Vitals and nursing note reviewed.  Constitutional:      Appearance: Normal appearance.  HENT:     Head: Normocephalic.     Nose: Nose normal.  Pulmonary:     Effort: Pulmonary effort is normal.  Musculoskeletal:        General: Normal range of motion.     Cervical back: Normal range of motion.  Neurological:     General: No focal deficit present.     Mental Status: He is alert and oriented to person, place, and time.  Psychiatric:        Attention and Perception: Attention and perception normal.        Mood and Affect: Mood is anxious.        Speech: Speech normal.        Behavior: Behavior normal. Behavior is cooperative.        Thought Content: Thought content normal.        Cognition and Memory: Cognition and memory normal.        Judgment: Judgment normal.     Review of Systems  Psychiatric/Behavioral:  The patient is nervous/anxious.   All other systems reviewed and are negative.   Blood pressure 125/68, pulse 90, temperature 98.7 F (37.1 C), temperature source Oral, resp. rate 18, height 6\' 3"  (1.905 m), weight 103.9 kg, SpO2 100 %.Body mass index is 28.62 kg/m.  General Appearance: Casual  Eye Contact:  Good  Speech:  Normal Rate  Volume:  Normal  Mood:  Anxious  Affect:  Congruent  Thought Process:  Coherent and Descriptions of Associations: Intact  Orientation:  Full (Time, Place, and Person)  Thought Content:  WDL and Logical  Suicidal Thoughts:  No   Homicidal Thoughts:  No  Memory:  Immediate;   Good Recent;   Good Remote;   Good  Judgement:  Fair  Insight:  Good  Psychomotor Activity:  Normal  Concentration:  Concentration: Good and Attention Span: Good  Recall:  Good  Fund of Knowledge:  Good  Language:  Good  Akathisia:  No  Handed:  Right  AIMS (if indicated):     Assets:  Leisure Time Physical Health Resilience  ADL's:  Intact  Cognition:  WNL  Sleep:        Physical Exam: Physical Exam Vitals and nursing note reviewed.  Constitutional:      Appearance: Normal appearance.  HENT:     Head: Normocephalic.     Nose: Nose normal.  Pulmonary:     Effort: Pulmonary effort is normal.  Musculoskeletal:        General: Normal range of motion.     Cervical back: Normal range of motion.  Neurological:     General: No focal deficit present.     Mental Status: He is alert and oriented to person, place, and time.  Psychiatric:        Attention and Perception: Attention and perception normal.        Mood and Affect: Mood is anxious.        Speech: Speech normal.  Behavior: Behavior normal. Behavior is cooperative.        Thought Content: Thought content normal.        Cognition and Memory: Cognition and memory normal.        Judgment: Judgment normal.    Review of Systems  Psychiatric/Behavioral:  The patient is nervous/anxious.   All other systems reviewed and are negative.  Blood pressure 125/68, pulse 90, temperature 98.7 F (37.1 C), temperature source Oral, resp. rate 18, height 6\' 3"  (1.905 m), weight 103.9 kg, SpO2 100 %. Body mass index is 28.62 kg/m.  Mental Status Per Nursing Assessment::   On Admission:  Self-harm thoughts  Demographic Factors:  Male and Unemployed  Loss Factors: NA  Historical Factors: NA  Risk Reduction Factors:   Positive social support  Continued Clinical Symptoms:  Anxiety, mild  Cognitive Features That Contribute To Risk:  None    Suicide Risk:   Minimal: No identifiable suicidal ideation.  Patients presenting with no risk factors but with morbid ruminations; may be classified as minimal risk based on the severity of the depressive symptoms   Plan Of Care/Follow-up recommendations:  Activity:  as tolerated Diet:  heart healthy diet Schizoaffective disorder, depressed type: Wellbutrin 300 mg daily Depakote 500 mg BID Seroquel 400 mg daily   Anxiety: Hydroxyzine 50 mg TID PRN  [phone], NP 01/06/2022, 2:33 PM

## 2022-01-06 NOTE — Progress Notes (Signed)
Patient pleasant and reserved. Has been active on the unit hanging in the dayroom with his peers. He is med compliant and received his meds without incident. Denies si/hi/avh continues to endorse depression and anxiety mostly due to upcoming discharge tomorrow. He reports being ready for discharge but has some apprehension about his follow up care and placement once he leaves. He also has some complaint of left flank pain that he rates 4/10 at this encounter. Tylenol offered but declined stating he will tough it out. Will continue to monitor with q15 minute safety checks. Encouraged him to come to staff with any concerns.     C Butler-Nicholson, LPN

## 2022-01-06 NOTE — Plan of Care (Signed)
  Problem: Activity: Goal: Will verbalize the importance of balancing activity with adequate rest periods Outcome: Progressing   Problem: Education: Goal: Will be free of psychotic symptoms Outcome: Progressing Goal: Knowledge of the prescribed therapeutic regimen will improve Outcome: Progressing   Problem: Coping: Goal: Coping ability will improve Outcome: Progressing Goal: Will verbalize feelings Outcome: Progressing   Problem: Coping: Goal: Level of anxiety will decrease Outcome: Progressing   Problem: Nutrition: Goal: Adequate nutrition will be maintained Outcome: Progressing   Problem: Activity: Goal: Risk for activity intolerance will decrease Outcome: Progressing   Problem: Clinical Measurements: Goal: Ability to maintain clinical measurements within normal limits will improve Outcome: Progressing Goal: Will remain free from infection Outcome: Progressing Goal: Diagnostic test results will improve Outcome: Progressing Goal: Respiratory complications will improve Outcome: Progressing Goal: Cardiovascular complication will be avoided Outcome: Progressing   Problem: Health Behavior/Discharge Planning: Goal: Ability to manage health-related needs will improve Outcome: Progressing   Problem: Education: Goal: Knowledge of General Education information will improve Description: Including pain rating scale, medication(s)/side effects and non-pharmacologic comfort measures Outcome: Progressing

## 2022-01-06 NOTE — Discharge Summary (Signed)
Physician Discharge Summary Note  Patient:  Timothy [name] is an 26 y.o., male MRN:  [id number] DOB:  20-Aug-1995 Patient phone:  [phone] (home)  Patient address:   [place] [address],  Total Time spent with patient: 45 minutes  Date of Admission:  12/07/2021 Date of Discharge: 01/06/2022  Reason for Admission:  psychosis  Principal Problem: Schizoaffective disorder, depressive type [facility]) Discharge Diagnoses: Principal Problem:   Schizoaffective disorder, depressive type ([place]) Active Problems:   Cannabis use disorder, moderate, dependence ([place])   Homelessness  Past Psychiatric History: schizoaffective disorder  Past Medical History:  Past Medical History:  Diagnosis Date   Cannabis abuse    Paranoid schizophrenia ([place])    Schizoaffective disorder, depressive type ([place]) 09/17/2014   History reviewed. No pertinent surgical history. Family History:  Family History  Problem Relation Age of Onset   Mental illness Brother    Drug abuse Brother    Mental illness Cousin    Suicidality Cousin    Diabetes Maternal Grandmother    Family Psychiatric  History: see above Social History: see above Social History   Substance and Sexual Activity  Alcohol Use Yes   Alcohol/week: 2.0 standard drinks of alcohol   Types: 2 Cans of beer per week     Social History   Substance and Sexual Activity  Drug Use Yes   Types: Marijuana, MDMA Banker)    Social History   Socioeconomic History   Marital status: Single    Spouse name: Not on file   Number of children: Not on file   Years of education: Not on file   Highest education level: Not on file  Occupational History   Not on file  Tobacco Use   Smoking status: Some Days    Packs/day: 0.25    Types: Cigarettes   Smokeless tobacco: Never  Vaping Use   Vaping Use: Never used  Substance and Sexual Activity   Alcohol use: Yes    Alcohol/week: 2.0 standard drinks of alcohol    Types: 2 Cans of beer per week    Drug use: Yes    Types: Marijuana, MDMA (Ecstacy)   Sexual activity: Yes  Other Topics Concern   Not on file  Social History Narrative   Not on file   Social Determinants of Health   Financial Resource Strain: Low Risk  (12/31/2019)   Overall Financial Resource Strain (CARDIA)    Difficulty of Paying Living Expenses: Not hard at all  Food Insecurity: No Food Insecurity (12/31/2019)   Hunger Vital Sign    Worried About Running Out of Food in the Last Year: Never true    Ran Out of Food in the Last Year: Never true  Transportation Needs: No Transportation Needs (12/31/2019)   PRAPARE - Hydrologist (Medical): No    Lack of Transportation (Non-Medical): No  Physical Activity: Inactive (12/31/2019)   Exercise Vital Sign    Days of Exercise per Week: 0 days    Minutes of Exercise per Session: 0 min  Stress: Stress Concern Present (12/31/2019)   [place]    Feeling of Stress : Rather much  Social Connections: Moderately Isolated (12/31/2019)   Social Connection and Isolation Panel [NHANES]    Frequency of Communication with Friends and Family: Once a week    Frequency of Social Gatherings with Friends and Family: Once a week    Attends Religious Services: More than 4  times per year    Active Member of Clubs or Organizations: Yes    Attends Archivist Meetings: More than 4 times per year    Marital Status: Never married    Hospital Course:   On admission on 12/05/21: Patient seen and chart reviewed.  Patient well known from previous encounters.  26 year old man with a history of schizoaffective disorder.  He was discharged from our unit on May 30 and presented back to an emergency facility in [place] only 2 days later.  At the time he was agitated and somewhat hostile disorganized paranoid and also making suicidal statements.  Patient admitted that he had used a little bit of cannabis  after discharge but it sounds like in general things did not go well.  He tells me that after leaving here he could not find his driver's license and so he had no ID.  He could not get in touch with his work.  His mood rapidly became worse despite taking his medicine.  Started hearing voices and having suicidal thoughts.  When he was picked up by the police he was reportedly trying to punch automobiles.  Patient tells me today that he is once again having suicidal thoughts without a specific plan.  He is having auditory hallucinations.  Mood is feeling very negative and hopeless.  Medications:  Stabilized quickly on Wellbutrin 300 mg daily, Depakote 500 mg BID, Seroquel 400 mg daily, and hydroxyzine 50 mg TID PRN  7/5: Client met with [facility] for an ACT team, it went well and they will follow up with him at the shelter.  He is stable.  When discharge is discussed he then starts talking about hallucinations, not responding on assessment.  He also mentions depression which he will need to address in outpatient as it is situational at this time because of discharge stress.  Patient has met maximum capacity for hospitalization.  She denies suicidal/homicidal ideations, hallucinations, and withdrawal symptoms.  Discharge instructions provided with explanations along with crisis numbers, Rx supply, and follow up appointment.  Physical Findings: AIMS: Facial and Oral Movements Muscles of Facial Expression: None, normal Lips and Perioral Area: None, normal Jaw: None, normal Tongue: None, normal,Extremity Movements Upper (arms, wrists, hands, fingers): None, normal Lower (legs, knees, ankles, toes): None, normal, Trunk Movements Neck, shoulders, hips: None, normal, Overall Severity Severity of abnormal movements (highest score from questions above): None, normal Incapacitation due to abnormal movements: None, normal Patient's awareness of abnormal movements (rate only patient's report): No Awareness,  Dental Status Current problems with teeth and/or dentures?: No Does patient usually wear dentures?: No  CIWA:    COWS:     Musculoskeletal: Strength & Muscle Tone: within normal limits Gait & Station: normal Patient leans: N/A  Psychiatric Specialty Exam: Physical Exam Vitals and nursing note reviewed.  Constitutional:      Appearance: Normal appearance.  HENT:     Head: Normocephalic.     Nose: Nose normal.  Pulmonary:     Effort: Pulmonary effort is normal.  Musculoskeletal:        General: Normal range of motion.     Cervical back: Normal range of motion.  Neurological:     General: No focal deficit present.     Mental Status: He is alert and oriented to person, place, and time.  Psychiatric:        Attention and Perception: Attention and perception normal.        Mood and Affect: Affect normal. Mood is anxious.  Speech: Speech normal.        Behavior: Behavior normal. Behavior is cooperative.        Thought Content: Thought content normal.        Cognition and Memory: Cognition and memory normal.        Judgment: Judgment normal.     Review of Systems  Psychiatric/Behavioral:  The patient is nervous/anxious.   All other systems reviewed and are negative.   Blood pressure 125/68, pulse 90, temperature 98.7 F (37.1 C), temperature source Oral, resp. rate 18, height 6' 3"  (1.905 m), weight 103.9 kg, SpO2 100 %.Body mass index is 28.62 kg/m.  General Appearance: Casual  Eye Contact:  Good  Speech:  Normal Rate  Volume:  Normal  Mood:  Anxious  Affect:  Congruent  Thought Process:  Coherent and Descriptions of Associations: Intact  Orientation:  Full (Time, Place, and Person)  Thought Content:  WDL and Logical  Suicidal Thoughts:  No  Homicidal Thoughts:  No  Memory:  Immediate;   Good Recent;   Good Remote;   Good  Judgement:  Fair  Insight:  Good  Psychomotor Activity:  Normal  Concentration:  Concentration: Good and Attention Span: Good  Recall:   Good  Fund of Knowledge:  Good  Language:  Good  Akathisia:  No  Handed:  Right  AIMS (if indicated):     Assets:  Leisure Time Physical Health Resilience  ADL's:  Intact  Cognition:  WNL  Sleep:         Physical Exam: Physical Exam Vitals and nursing note reviewed.  Constitutional:      Appearance: Normal appearance.  HENT:     Head: Normocephalic.     Nose: Nose normal.  Pulmonary:     Effort: Pulmonary effort is normal.  Musculoskeletal:        General: Normal range of motion.     Cervical back: Normal range of motion.  Neurological:     General: No focal deficit present.     Mental Status: He is alert and oriented to person, place, and time.  Psychiatric:        Attention and Perception: Attention and perception normal.        Mood and Affect: Affect normal. Mood is anxious.        Speech: Speech normal.        Behavior: Behavior normal. Behavior is cooperative.        Thought Content: Thought content normal.        Cognition and Memory: Cognition and memory normal.        Judgment: Judgment normal.    Review of Systems  Psychiatric/Behavioral:  The patient is nervous/anxious.   All other systems reviewed and are negative.  Blood pressure 125/68, pulse 90, temperature 98.7 F (37.1 C), temperature source Oral, resp. rate 18, height 6' 3"  (1.905 m), weight 103.9 kg, SpO2 100 %. Body mass index is 28.62 kg/m.   Social History   Tobacco Use  Smoking Status Some Days   Packs/day: 0.25   Types: Cigarettes  Smokeless Tobacco Never   Tobacco Cessation:  N/A, patient does not currently use tobacco products   Blood Alcohol level:  Lab Results  Component Value Date   ETH <10 12/03/2021   ETH <10 59/16/3846    Metabolic Disorder Labs:  Lab Results  Component Value Date   HGBA1C 5.5 11/04/2021   MPG 111.15 11/04/2021   MPG 114 06/17/2021   Lab Results  Component Value  Date   PROLACTIN 33.2 (H) 08/15/2016   Lab Results  Component Value Date    CHOL 149 11/04/2021   TRIG 56 11/04/2021   HDL 35 (L) 11/04/2021   CHOLHDL 4.3 11/04/2021   VLDL 11 11/04/2021   LDLCALC 103 (H) 11/04/2021   LDLCALC 111 (H) 06/17/2021    See Psychiatric Specialty Exam and Suicide Risk Assessment completed by Attending Physician prior to discharge.  Discharge destination:  Home  Is patient on multiple antipsychotic therapies at discharge:  No   Has Patient had three or more failed trials of antipsychotic monotherapy by history:  No  Recommended Plan for Multiple Antipsychotic Therapies: NA  Discharge Instructions     Diet - low sodium heart healthy   Complete by: As directed    Discharge instructions   Complete by: As directed    Follow up with outpatient appointment   Increase activity slowly   Complete by: As directed       Allergies as of 01/06/2022       Reactions   Haldol [haloperidol Lactate] Other (See Comments)   Muscle stiffness   Penicillins Hives   Felt like throat was closing Has patient had a PCN reaction causing immediate rash, facial/tongue/throat swelling, SOB or lightheadedness with hypotension: Unknown Has patient had a PCN reaction causing severe rash involving mucus membranes or skin necrosis: unknown Has patient had a PCN reaction that required hospitalization Unknown Has patient had a PCN reaction occurring within the last 10 years: Unknown If all of the above answers are "NO", then may proceed with Cephalosporin use.   Zyprexa [olanzapine]    eps        Medication List     STOP taking these medications    FLUoxetine 20 MG capsule Commonly known as: PROZAC       TAKE these medications      Indication  buPROPion 300 MG 24 hr tablet Commonly known as: WELLBUTRIN XL Take 1 tablet (300 mg total) by mouth daily.  Indication: Major Depressive Disorder   divalproex 500 MG DR tablet Commonly known as: DEPAKOTE Take 1 tablet (500 mg total) by mouth every 12 (twelve) hours.  Indication: Schizophrenia    hydrOXYzine 50 MG tablet Commonly known as: ATARAX Take 1 tablet (50 mg total) by mouth 3 (three) times daily as needed for anxiety.  Indication: Feeling Anxious   QUEtiapine 400 MG tablet Commonly known as: SEROQUEL Take 1 tablet (400 mg total) by mouth at bedtime. What changed:  medication strength how much to take  Indication: Depressive Phase of Manic-Depression, Schizophrenia         Follow-up recommendations:   Activity:  as tolerated Diet:  heart healthy diet Schizoaffective disorder, depressed type: Wellbutrin 300 mg daily Depakote 500 mg BID Seroquel 400 mg daily   Anxiety: Hydroxyzine 50 mg TID PRN  Comments:  follow up with ACT team  Signed: Waylan Boga, NP 01/06/2022, 2:38 PM

## 2022-01-07 ENCOUNTER — Other Ambulatory Visit: Payer: Self-pay

## 2022-01-07 NOTE — Progress Notes (Signed)
Recreation Therapy Notes  Date: 01/07/2022   Time: 10:20 am    Location: Courtyard   Behavioral response: Appropriate  Intervention Topic:  Wellness    Discussion/Intervention:  Group content today was focused on Wellness. The group defined wellness and some positive ways they make decisions for themselves. Individuals expressed reasons why they neglected any wellness in the past. Patients described ways to improve wellness skills in the future. The group explained what could happen if they did not do any wellness at all. Participants express how bad choices has affected them and others around them. Individual explained the importance of wellness. The group participated in the intervention "Testing my Wellness" where they had a chance to identify some of their weaknesses and strengths in wellness.  Clinical Observations/Feedback: Patient came to group and identified basketball as a wellness activity he participates in. Individual was social with peers and staff while participating in the intervention.     Syreeta Figler LRT/CTRS         Haiden Clucas 01/07/2022 12:33 PM

## 2022-01-07 NOTE — Progress Notes (Signed)
Recreation Therapy Notes  INPATIENT RECREATION TR PLAN  Patient Details Name: Timothy [name] MRN: [id number] DOB: 09/18/1995 Today's Date: 01/07/2022  Rec Therapy Plan Is patient appropriate for Therapeutic Recreation?: Yes Treatment times per week: At least 3 Estimated Length of Stay: 5-7 days TR Treatment/Interventions: Group participation (Comment)  Discharge Criteria Pt will be discharged from therapy if:: Discharged Treatment plan/goals/alternatives discussed and agreed upon by:: Patient/family  Discharge Summary Short term goals set: Patient will successfully identify 2 ways of making healthy decisions post d/c within 5 recreation therapy group sessions Short term goals met: Complete Progress toward goals comments: Groups attended Which groups?: Social skills, Coping skills, Leisure education, Wellness, Other (Comment) (Time management, self-care, problem solving) Reason goals not met: N/A Therapeutic equipment acquired: N/A Reason patient discharged from therapy: Discharge from hospital Pt/family agrees with progress & goals achieved: Yes Date patient discharged from therapy: 01/07/22      01/07/2022, 12:44 PM 

## 2022-01-07 NOTE — Progress Notes (Signed)
Client continues to be psychiatrically stable.  He was scheduled for discharge yesterday but was unable to based on needing a 10 day supply of medications.  Discharging this am to a shelter with an ACT team, [facility], in place.  Nanine Means, PMHNP

## 2022-01-07 NOTE — Progress Notes (Signed)
D: Pt alert and oriented. Pt denies experiencing any pain, SI/HI, or AVH at this time. Pt reports he will be able to keep himself safe when he returns home.   A: Pt received discharge and medication education/information. Pt belongings were returned and signed for at this time to include a printed prescription and a medication supply.   R: Pt verbalized understanding of discharge and medication education/information.  Pt escorted by staff to medical mall front lobby where pt was picked up and transported by [employer] to [facility].

## 2022-01-07 NOTE — [facility] Counselor (Addendum)
CSW met with pt briefly regarding discharge. He stated that he was ready and that his information had been completed by CSW on yesterday. CSW inquired if pt needed anything else. He denied any other needs. No other concerns expressed. Contact ended without incident.   CSW reviewed chart. Discharge plans are completed. CSW will contact taxi for transportation.    R. , MSW, LCSW, LCAS 01/07/2022 10:08 AM  

## 2022-01-07 NOTE — Plan of Care (Signed)
D: Pt alert and oriented. Pt rates depression 6/10, hopelessness 6/10, and anxiety 9/10. Pt reports energy level as normal and concentration as being good. Pt reports sleep last night as being fair. Pt did receive medications for sleep and did find them helpful. Pt verbally denies experiencing any pain at this time. Pt denies experiencing any SI/HI, or AVH at this time.   A: Scheduled medications administered to pt, per MD orders. Support and encouragement provided. Frequent verbal contact made. Routine safety checks conducted q15 minutes.   R: No adverse drug reactions noted. Pt verbally contracts for safety at this time. Pt compliant with medications. Pt interacts well with others on the unit. Pt remains safe at this time. Will continue to monitor.   Problem: Activity: Goal: Will verbalize the importance of balancing activity with adequate rest periods Outcome: Progressing   Problem: Coping: Goal: Coping ability will improve Outcome: Progressing

## 2022-01-07 NOTE — [facility] Group Notes (Signed)
[facility] Group Notes:  (Nursing/MHT/Case Management/Adjunct)  Date:  01/07/2022  Time:  10:43 AM  Type of Therapy:   Community Meeting  Participation Level:  Did Not Attend  Lynelle Smoke [facility] 01/07/2022, 10:43 AM

## 2023-05-18 ENCOUNTER — Encounter: Payer: Self-pay | Admitting: Physician Assistant

## 2023-05-18 NOTE — Progress Notes (Signed)
Pt new to [place], says needs a checkup.  He is working at [employer], plans to get a phone next week.   Says got out of prison a few weeks ago, he and his mom do not get along.   Asks for a flu shot >> we do not do.  He has back pain, had an episode in the hospital and had an injection in his back, not clear when. Has used lidocaine patches w/ some relief. A friend used to "pop" his back, which helped.   Has not been on any rx for months. Denies street drugs, no THC for several weeks. Smokes Black and Milds  Prior to Admission medications   Medication Sig Start Date End Date Taking? Authorizing Provider  buPROPion (WELLBUTRIN XL) 300 MG 24 hr tablet Take 1 tablet (300 mg total) by mouth daily for 10 days. 01/06/22 01/16/22  Charm Rings, NP  divalproex (DEPAKOTE) 500 MG DR tablet Take 1 tablet (500 mg total) by mouth every 12 (twelve) hours for 20 days. 01/06/22 01/26/22  Charm Rings, NP  QUEtiapine (SEROQUEL) 400 MG tablet Take 1 tablet (400 mg total) by mouth at bedtime for 10 days. 01/06/22 01/16/22  Charm Rings, NP   He has spots in his eyes, they move around. No obvious cause on eye exam. Wears glasses, needs eye exam.   Wonders about diabetes Lab Results  Component Value Date   HGBA1C 5.5 11/04/2021   Wonders about HTN Vitals:   05/18/23 1634  BP: 131/79  Pulse: 69  SpO2: 98%   Will get set up w/ PW on 12/10.   Theodore Demark, PA-C 05/18/2023 4:44 PM

## 2023-05-23 ENCOUNTER — Encounter (HOSPITAL_COMMUNITY): Payer: Self-pay | Admitting: Emergency Medicine

## 2023-05-23 ENCOUNTER — Emergency Department (HOSPITAL_COMMUNITY)
Admission: EM | Admit: 2023-05-23 | Discharge: 2023-05-23 | Disposition: A | Discharge status: Home / Self Care | Payer: MEDICAID | Attending: Emergency Medicine | Admitting: Emergency Medicine

## 2023-05-23 ENCOUNTER — Other Ambulatory Visit: Payer: Self-pay

## 2023-05-23 DIAGNOSIS — L089 Local infection of the skin and subcutaneous tissue, unspecified: Secondary | ICD-10-CM | POA: Insufficient documentation

## 2023-05-23 DIAGNOSIS — B353 Tinea pedis: Secondary | ICD-10-CM | POA: Diagnosis not present

## 2023-05-23 DIAGNOSIS — M79672 Pain in left foot: Secondary | ICD-10-CM | POA: Diagnosis present

## 2023-05-23 MED ORDER — NYSTATIN 100000 UNIT/GM EX POWD: 1.0000 | Freq: Three times a day (TID) | CUTANEOUS | 0 refills | Status: AC

## 2023-05-23 MED ORDER — SULFAMETHOXAZOLE-TRIMETHOPRIM 800-160 MG PO TABS: 1.0000 | ORAL_TABLET | Freq: Two times a day (BID) | ORAL | 0 refills | Status: AC

## 2023-05-23 MED ORDER — SULFAMETHOXAZOLE-TRIMETHOPRIM 800-160 MG PO TABS
1.0000 | ORAL_TABLET | Freq: Once | ORAL | Status: AC
  Administered 2023-05-23: 1 via ORAL
  Filled 2023-05-23: qty 1

## 2023-05-23 NOTE — ED Provider Notes (Signed)
[place] EMERGENCY DEPARTMENT AT [facility] Provider Note   CSN: [id number] Arrival date & time: 05/23/23  0031     History  Chief Complaint  Patient presents with   Foot Pain    Timothy [name] is a 27 y.o. male.  The history is provided by the patient.  Foot Pain This is a new problem. The current episode started more than 2 days ago. The problem occurs constantly. Pertinent negatives include no chest pain, no abdominal pain, no headaches and no shortness of breath. Nothing aggravates the symptoms. Nothing relieves the symptoms. He has tried nothing for the symptoms. The treatment provided no relief.  Skin breakdown between left first and second toes post getting wet.       Home Medications Prior to Admission medications   Medication Sig Start Date End Date Taking? Authorizing Provider  nystatin (MYCOSTATIN/NYSTOP) powder Apply 1 Application topically 3 (three) times daily. 05/23/23  Yes Rudolph Daoust, MD  sulfamethoxazole-trimethoprim (BACTRIM DS) 800-160 MG tablet Take 1 tablet by mouth 2 (two) times daily for 7 days. 05/23/23 05/30/23 Yes Salih Williamson, MD  buPROPion (WELLBUTRIN XL) 300 MG 24 hr tablet Take 1 tablet (300 mg total) by mouth daily for 10 days. 01/06/22 01/16/22  Charm Rings, NP  divalproex (DEPAKOTE) 500 MG DR tablet Take 1 tablet (500 mg total) by mouth every 12 (twelve) hours for 20 days. 01/06/22 01/26/22  Charm Rings, NP  QUEtiapine (SEROQUEL) 400 MG tablet Take 1 tablet (400 mg total) by mouth at bedtime for 10 days. 01/06/22 01/16/22  Charm Rings, NP      Allergies    Haldol [haloperidol lactate], Penicillins, and Zyprexa [olanzapine]    Review of Systems   Review of Systems  Constitutional:  Negative for fever.  HENT:  Negative for facial swelling.   Respiratory:  Negative for shortness of breath.   Cardiovascular:  Negative for chest pain.  Gastrointestinal:  Negative for abdominal pain.  Neurological:  Negative for headaches.   All other systems reviewed and are negative.   Physical Exam Updated Vital Signs BP 133/80 (BP Location: Right Arm)   Pulse (!) 59   Temp 98 F (36.7 C) (Oral)   Resp 18   Wt 103 kg   SpO2 100%   BMI 28.38 kg/m  Physical Exam Vitals and nursing note reviewed.  Constitutional:      General: He is not in acute distress.    Appearance: He is well-developed. He is not diaphoretic.  HENT:     Head: Normocephalic and atraumatic.     Nose: Nose normal.  Eyes:     Conjunctiva/sclera: Conjunctivae normal.     Pupils: Pupils are equal, round, and reactive to light.  Cardiovascular:     Rate and Rhythm: Normal rate and regular rhythm.  Pulmonary:     Effort: Pulmonary effort is normal.     Breath sounds: Normal breath sounds. No wheezing or rales.  Abdominal:     General: Bowel sounds are normal.     Palpations: Abdomen is soft.     Tenderness: There is no abdominal tenderness. There is no guarding or rebound.  Musculoskeletal:        General: Normal range of motion.     Cervical back: Normal range of motion and neck supple.       Feet:  Skin:    General: Skin is warm and dry.     Capillary Refill: Capillary refill takes less than 2 seconds.  Neurological:     General: No focal deficit present.     Mental Status: He is alert and oriented to person, place, and time.     ED Results / Procedures / Treatments   Labs (all labs ordered are listed, but only abnormal results are displayed) Labs Reviewed - No data to display  EKG None  Radiology No results found.  Procedures Procedures    Medications Ordered in ED Medications  sulfamethoxazole-trimethoprim (BACTRIM DS) 800-160 MG per tablet 1 tablet (has no administration in time range)    ED Course/ Medical Decision Making/ A&P                                 Medical Decision Making Patient whose feet got wet and now has skin breakdown   Amount and/or Complexity of Data Reviewed External Data Reviewed:  notes.    Details: Previous notes reviewed   Risk Prescription drug management. Risk Details: Wound care provided.  New socks provided.  Will treat for both athlete's foot and mild skin infection.  Nystatin for athlete's foot and bactrim for infection.  Well appearing.  Stable for discharge.      Final Clinical Impression(s) / ED Diagnoses Final diagnoses:  Tinea pedis of left foot  Skin infection   Return for intractable cough, coughing up blood, fevers > 100.4 unrelieved by medication, shortness of breath, intractable vomiting, chest pain, shortness of breath, weakness, numbness, changes in speech, facial asymmetry, abdominal pain, passing out, Inability to tolerate liquids or food, cough, altered mental status or any concerns. No signs of systemic illness or infection. The patient is nontoxic-appearing on exam and vital signs are within normal limits.  I have reviewed the triage vital signs and the nursing notes. Pertinent labs & imaging results that were available during my care of the patient were reviewed by me and considered in my medical decision making (see chart for details). After history, exam, and medical workup I feel the patient has been appropriately medically screened and is safe for discharge home. Pertinent diagnoses were discussed with the patient. Patient was given return precautions.  Rx / DC Orders ED Discharge Orders          Ordered    nystatin (MYCOSTATIN/NYSTOP) powder  3 times daily        05/23/23 0115    sulfamethoxazole-trimethoprim (BACTRIM DS) 800-160 MG tablet  2 times daily        05/23/23 0115              Amandamarie Feggins, MD 05/23/23 8295

## 2023-05-23 NOTE — ED Triage Notes (Addendum)
Foot pain since Wednesday. Wound between first and second toe, feet have been frequently wet, 10/10 pain. Ambulatory from triage.  Pt speaks excessively softly, difficult to obtain triage interview information d/t this

## 2023-06-11 NOTE — Progress Notes (Unsigned)
New Patient Office Visit  Subjective    Patient ID: Timothy [name], male    DOB: 1995-10-18  Age: 27 y.o. MRN: [id number]  CC: No chief complaint on file.   HPI Timothy [name] presents to establish care ***  Outpatient Encounter Medications as of 06/14/2023  Medication Sig   buPROPion (WELLBUTRIN XL) 300 MG 24 hr tablet Take 1 tablet (300 mg total) by mouth daily for 10 days.   divalproex (DEPAKOTE) 500 MG DR tablet Take 1 tablet (500 mg total) by mouth every 12 (twelve) hours for 20 days.   nystatin (MYCOSTATIN/NYSTOP) powder Apply 1 Application topically 3 (three) times daily.   QUEtiapine (SEROQUEL) 400 MG tablet Take 1 tablet (400 mg total) by mouth at bedtime for 10 days.   No facility-administered encounter medications on file as of 06/14/2023.    Past Medical History:  Diagnosis Date   Cannabis abuse    Paranoid schizophrenia (HCC)    Schizoaffective disorder, depressive type (HCC) 09/17/2014    No past surgical history on file.  Family History  Problem Relation Age of Onset   Mental illness Brother    Drug abuse Brother    Mental illness Cousin    Suicidality Cousin    Diabetes Maternal Grandmother     Social History   Socioeconomic History   Marital status: Single    Spouse name: Not on file   Number of children: Not on file   Years of education: Not on file   Highest education level: Not on file  Occupational History   Not on file  Tobacco Use   Smoking status: Some Days    Current packs/day: 0.25    Types: Cigarettes   Smokeless tobacco: Never  Vaping Use   Vaping status: Never Used  Substance and Sexual Activity   Alcohol use: Yes    Alcohol/week: 2.0 standard drinks of alcohol    Types: 2 Cans of beer per week   Drug use: Yes    Types: Marijuana, MDMA (Ecstacy)   Sexual activity: Yes  Other Topics Concern   Not on file  Social History Narrative   Not on file   Social Determinants of Health   Financial Resource Strain: Low Risk   (12/31/2019)   Overall Financial Resource Strain (CARDIA)    Difficulty of Paying Living Expenses: Not hard at all  Food Insecurity: No Food Insecurity (12/31/2019)   Hunger Vital Sign    Worried About Running Out of Food in the Last Year: Never true    Ran Out of Food in the Last Year: Never true  Transportation Needs: No Transportation Needs (12/31/2019)   PRAPARE - Administrator, Civil Service (Medical): No    Lack of Transportation (Non-Medical): No  Physical Activity: Inactive (12/31/2019)   Exercise Vital Sign    Days of Exercise per Week: 0 days    Minutes of Exercise per Session: 0 min  Stress: Stress Concern Present (12/31/2019)   [employer] of Occupational Health - Occupational Stress Questionnaire    Feeling of Stress : Rather much  Social Connections: Moderately Isolated (12/31/2019)   Social Connection and Isolation Panel [NHANES]    Frequency of Communication with Friends and Family: Once a week    Frequency of Social Gatherings with Friends and Family: Once a week    Attends Religious Services: More than 4 times per year    Active Member of [employer] or Organizations: Yes    Attends Banker  Meetings: More than 4 times per year    Marital Status: Never married  Intimate Partner Violence: Not At Risk (12/31/2019)   Humiliation, Afraid, Rape, and Kick questionnaire    Fear of Current or Ex-Partner: No    Emotionally Abused: No    Physically Abused: No    Sexually Abused: No    ROS      Objective    There were no vitals taken for this visit.  Physical Exam  {Labs (Optional):23779}    Assessment & Plan:   Problem List Items Addressed This Visit   None   No follow-ups on file.   Shan Levans, MD

## 2023-06-14 ENCOUNTER — Ambulatory Visit: Payer: Medicaid Other | Admitting: Critical Care Medicine

## 2023-06-27 ENCOUNTER — Emergency Department (HOSPITAL_COMMUNITY)
Admission: EM | Admit: 2023-06-27 | Discharge: 2023-06-27 | Disposition: A | Discharge status: Home / Self Care | Payer: MEDICAID | Attending: Emergency Medicine | Admitting: Emergency Medicine

## 2023-06-27 ENCOUNTER — Encounter (HOSPITAL_COMMUNITY): Payer: Self-pay

## 2023-06-27 ENCOUNTER — Other Ambulatory Visit: Payer: Self-pay

## 2023-06-27 DIAGNOSIS — R4689 Other symptoms and signs involving appearance and behavior: Secondary | ICD-10-CM | POA: Diagnosis present

## 2023-06-27 DIAGNOSIS — R400 Somnolence: Secondary | ICD-10-CM | POA: Insufficient documentation

## 2023-06-27 LAB — COMPREHENSIVE METABOLIC PANEL
ALT: 21 U/L (ref 0–44)
AST: 27 U/L (ref 15–41)
Albumin: 4.3 g/dL (ref 3.5–5.0)
Alkaline Phosphatase: 37 U/L — ABNORMAL LOW (ref 38–126)
Anion gap: 11 (ref 5–15)
BUN: 18 mg/dL (ref 6–20)
CO2: 22 mmol/L (ref 22–32)
Calcium: 9.3 mg/dL (ref 8.9–10.3)
Chloride: 104 mmol/L (ref 98–111)
Creatinine, Ser: 1.13 mg/dL (ref 0.61–1.24)
GFR, Estimated: >60 mL/min (ref >60)
Glucose, Bld: 86 mg/dL (ref 70–99)
Potassium: 3.6 mmol/L (ref 3.5–5.1)
Sodium: 137 mmol/L (ref 135–145)
Total Bilirubin: 1.2 mg/dL — ABNORMAL HIGH (ref <1.2)
Total Protein: 7.7 g/dL (ref 6.5–8.1)

## 2023-06-27 LAB — URINALYSIS, ROUTINE W REFLEX MICROSCOPIC
Bacteria, UA: NONE SEEN
Bilirubin Urine: NEGATIVE
Glucose, UA: NEGATIVE mg/dL
Hgb urine dipstick: NEGATIVE
Ketones, ur: 5 mg/dL — AB
Leukocytes,Ua: NEGATIVE
Nitrite: NEGATIVE
Protein, ur: 30 mg/dL — AB
Specific Gravity, Urine: 1.031 — ABNORMAL HIGH (ref 1.005–1.030)
pH: 5 (ref 5.0–8.0)

## 2023-06-27 LAB — CBC WITH DIFFERENTIAL/PLATELET
Abs Immature Granulocytes: 0.01 10*3/uL (ref 0.00–0.07)
Basophils Absolute: 0 10*3/uL (ref 0.0–0.1)
Basophils Relative: 0 %
Eosinophils Absolute: 0 10*3/uL (ref 0.0–0.5)
Eosinophils Relative: 1 %
HCT: 45.2 % (ref 39.0–52.0)
Hemoglobin: 15.3 g/dL (ref 13.0–17.0)
Immature Granulocytes: 0 %
Lymphocytes Relative: 28 %
Lymphs Abs: 1.8 10*3/uL (ref 0.7–4.0)
MCH: 31 pg (ref 26.0–34.0)
MCHC: 33.8 g/dL (ref 30.0–36.0)
MCV: 91.7 fL (ref 80.0–100.0)
Monocytes Absolute: 0.7 10*3/uL (ref 0.1–1.0)
Monocytes Relative: 11 %
Neutro Abs: 4 10*3/uL (ref 1.7–7.7)
Neutrophils Relative %: 60 %
Platelets: 219 10*3/uL (ref 150–400)
RBC: 4.93 MIL/uL (ref 4.22–5.81)
RDW: 12.7 % (ref 11.5–15.5)
WBC: 6.6 10*3/uL (ref 4.0–10.5)
nRBC: 0 % (ref 0.0–0.2)

## 2023-06-27 LAB — RAPID URINE DRUG SCREEN, HOSP PERFORMED
Amphetamines: NOT DETECTED
Barbiturates: NOT DETECTED
Benzodiazepines: NOT DETECTED
Cocaine: NOT DETECTED
Opiates: NOT DETECTED
Tetrahydrocannabinol: POSITIVE — AB

## 2023-06-27 LAB — ETHANOL: Alcohol, Ethyl (B): <10 mg/dL (ref <10)

## 2023-06-27 LAB — CBG MONITORING, ED: Glucose-Capillary: 135 mg/dL — ABNORMAL HIGH (ref 70–99)

## 2023-06-27 NOTE — Discharge Instructions (Signed)
Your blood work and EKG are overall reassuring.  Please get adequate sleep so that way you do not fall asleep during the day.

## 2023-06-27 NOTE — ED Provider Notes (Signed)
[place] EMERGENCY DEPARTMENT AT [facility] Provider Note   CSN: [id number] Arrival date & time: 06/27/23  1840     History  Chief Complaint  Patient presents with   Psychiatric Evaluation    Timothy [name] is a 27 y.o. male.  The history is provided by the patient, medical records and the EMS personnel. No language interpreter was used.     27 year old male significant history of paranoid schizophrenia, polysubstance abuse, brought here via EMS from work for concerns of psychiatric illness.  Per EMS, patient was found in a fetal position at work.  Patient states he passed out while at work today.  He attributed due to not sleeping enough.  He feels a bit drowsy but otherwise denies having any pain.  He denies hitting his head, having nausea and vomiting, having focal numbness or weakness, tongue biting, urinary or bowel incontinence.  He does not endorse having any SI HI or AVH.  Denies any heart palpitation chest pain shortness of breath abdominal pain no pain to his extremities.  He admits to alcohol or marijuana use last marijuana use was today.  States he took 1 shot of alcohol today  Home Medications Prior to Admission medications   Medication Sig Start Date End Date Taking? Authorizing Provider  buPROPion (WELLBUTRIN XL) 300 MG 24 hr tablet Take 1 tablet (300 mg total) by mouth daily for 10 days. 01/06/22 01/16/22  Charm Rings, NP  divalproex (DEPAKOTE) 500 MG DR tablet Take 1 tablet (500 mg total) by mouth every 12 (twelve) hours for 20 days. 01/06/22 01/26/22  Charm Rings, NP  nystatin (MYCOSTATIN/NYSTOP) powder Apply 1 Application topically 3 (three) times daily. 05/23/23   Palumbo, April, MD  QUEtiapine (SEROQUEL) 400 MG tablet Take 1 tablet (400 mg total) by mouth at bedtime for 10 days. 01/06/22 01/16/22  Charm Rings, NP      Allergies    Haldol [haloperidol lactate], Penicillins, and Zyprexa [olanzapine]    Review of Systems   Review of Systems  All  other systems reviewed and are negative.   Physical Exam Updated Vital Signs BP 120/80   Pulse 68   Temp 98.4 F (36.9 C) (Oral)   Resp 18   Ht 6\' 3"  (1.905 m)   Wt 103 kg   SpO2 98%   BMI 28.38 kg/m  Physical Exam Vitals and nursing note reviewed.  Constitutional:      General: He is not in acute distress.    Appearance: He is well-developed.  HENT:     Head: Atraumatic.  Eyes:     Conjunctiva/sclera: Conjunctivae normal.  Cardiovascular:     Rate and Rhythm: Normal rate and regular rhythm.     Pulses: Normal pulses.     Heart sounds: Normal heart sounds.  Pulmonary:     Effort: Pulmonary effort is normal.     Breath sounds: Normal breath sounds.  Abdominal:     Palpations: Abdomen is soft.  Musculoskeletal:     Cervical back: Neck supple.     Comments: Moving all 4 extremities without difficulty.  Skin:    Findings: No rash.  Neurological:     Mental Status: He is alert. Mental status is at baseline.     ED Results / Procedures / Treatments   Labs (all labs ordered are listed, but only abnormal results are displayed) Labs Reviewed  URINALYSIS, ROUTINE W REFLEX MICROSCOPIC - Abnormal; Notable for the following components:  Result Value   Specific Gravity, Urine 1.031 (*)    Ketones, ur 5 (*)    Protein, ur 30 (*)    All other components within normal limits  COMPREHENSIVE METABOLIC PANEL - Abnormal; Notable for the following components:   Alkaline Phosphatase 37 (*)    Total Bilirubin 1.2 (*)    All other components within normal limits  CBG MONITORING, ED - Abnormal; Notable for the following components:   Glucose-Capillary 135 (*)    All other components within normal limits  CBC WITH DIFFERENTIAL/PLATELET    EKG None   Date: 06/27/2023  Rate: 54  Rhythm: sinus bradycardia  QRS Axis: normal  Intervals: normal  ST/T Wave abnormalities: normal  Conduction Disutrbances: none  Narrative Interpretation:   Old EKG Reviewed: No significant  changes noted    Radiology No results found.  Procedures Procedures    Medications Ordered in ED Medications - No data to display  ED Course/ Medical Decision Making/ A&P                                 Medical Decision Making Amount and/or Complexity of Data Reviewed Labs: ordered. ECG/medicine tests: ordered.   BP 120/80   Pulse 68   Temp 98.4 F (36.9 C) (Oral)   Resp 18   Ht 6\' 3"  (1.905 m)   Wt 103 kg   SpO2 98%   BMI 28.38 kg/m   14:55 PM  27 year old male significant history of paranoid schizophrenia, polysubstance abuse, brought here via EMS from work for concerns of psychiatric illness.  Per EMS, patient was found in a fetal position at work.  Patient states he passed out while at work today.  He attributed due to not sleeping enough.  He feels a bit drowsy but otherwise denies having any pain.  He denies hitting his head, having nausea and vomiting, having focal numbness or weakness, tongue biting, urinary or bowel incontinence.  He does not endorse having any SI HI or AVH.  Denies any heart palpitation chest pain shortness of breath abdominal pain no pain to his extremities.  He admits to alcohol or marijuana use last marijuana use was today.  States he took 1 shot of alcohol today  On exam patient appears drowsy but easily arousable.  He is mentating appropriately answering questions appropriately.  He does not exhibit any overt psychosis.  He does not shows any signs of injury.  Vital sign overall reassuring no fever no hypoxia.  -Labs ordered, independently viewed and interpreted by me.  Labs remarkable for reassuring labs -The patient was maintained on a cardiac monitor.  I personally viewed and interpreted the cardiac monitored which showed an underlying rhythm of: sinus bradycardia -Imaging not considered -This patient presents to the ED for concern of syncope, this involves an extensive number of treatment options, and is a complaint that carries with it  a high risk of complications and morbidity.  The differential diagnosis includes cardiac arrhythmia, lack of sleep, anemia, mi, stroke, drug induced, hypoglycemia -Co morbidities that complicate the patient evaluation includes substance use, psych disorder -Treatment includes monitoring -Reevaluation of the patient after these medicines showed that the patient improved -PCP office notes or outside notes reviewed -Escalation to admission/observation considered: patients feels much better, is comfortable with discharge, and will follow up with PCP -Prescription medication considered, patient comfortable with OTC meds -Social Determinant of Health considered which includes tobacco use  Final Clinical Impression(s) / ED Diagnoses Final diagnoses:  Somnolence    Rx / DC Orders ED Discharge Orders          Ordered    Ethanol        06/27/23 2017    Rapid urine drug screen (hospital performed)        06/27/23 2017              Fayrene Helper, PA-C 06/27/23 2137    Lonell Grandchild, MD 06/27/23 2229

## 2023-06-27 NOTE — ED Triage Notes (Signed)
BIB EMS from work for psychiatric evaluation. Pt was found in fetal position at work, states he smoked marijuana before coming.Pt is homeless. Hx of paranoid schizophrenia

## 2023-07-04 ENCOUNTER — Ambulatory Visit: Payer: No Typology Code available for payment source | Admitting: Physician Assistant

## 2023-07-04 ENCOUNTER — Encounter: Payer: Self-pay | Admitting: Physician Assistant

## 2023-07-04 ENCOUNTER — Encounter: Payer: Self-pay | Admitting: *Deleted

## 2023-07-04 VITALS — BP 107/67 | Ht 75.5 in | Wt 236.0 lb

## 2023-07-04 DIAGNOSIS — H6120 Impacted cerumen, unspecified ear: Secondary | ICD-10-CM | POA: Diagnosis not present

## 2023-07-04 DIAGNOSIS — U070 Vaping-related disorder: Secondary | ICD-10-CM

## 2023-07-04 DIAGNOSIS — F17210 Nicotine dependence, cigarettes, uncomplicated: Secondary | ICD-10-CM

## 2023-07-04 DIAGNOSIS — Z23 Encounter for immunization: Secondary | ICD-10-CM | POA: Diagnosis not present

## 2023-07-04 DIAGNOSIS — L309 Dermatitis, unspecified: Secondary | ICD-10-CM | POA: Diagnosis not present

## 2023-07-04 DIAGNOSIS — Z5900 Homelessness unspecified: Secondary | ICD-10-CM

## 2023-07-04 MED ORDER — TRIAMCINOLONE ACETONIDE 0.1 % EX CREA: 1.0000 | TOPICAL_CREAM | Freq: Two times a day (BID) | CUTANEOUS | 0 refills | Status: AC

## 2023-07-04 NOTE — Progress Notes (Signed)
New Patient Office Visit  Subjective    Patient ID: Timothy [name], male    DOB: [birth year]  Age: 27 y.o. MRN: [id number]  CC:  Chief Complaint  Patient presents with   Rash    Face and back, patient thinks it could be related to soap    Immunizations    Flu vaccine     HPI Timothy [name] states that he has been having an itchy rash on his face, back, chest and upper arms.  States that he has not had any new medications, body washes, lotions, detergents or fragrances.  Does state that he did start a different soap and thinks it may be related to that.  States that he has been using antifungal cream on it without relief.  Also complains of fullness and itchiness in his left ear.  States that he does use Q-tips to clean and has noticed excessive wax.  Denies ear pain.  States rash has been present for the past week.  No other concerns at this time  Outpatient Encounter Medications as of 07/04/2023  Medication Sig   triamcinolone cream (KENALOG) 0.1 % Apply 1 Application topically 2 (two) times daily.   buPROPion (WELLBUTRIN XL) 300 MG 24 hr tablet Take 1 tablet (300 mg total) by mouth daily for 10 days.   divalproex (DEPAKOTE) 500 MG DR tablet Take 1 tablet (500 mg total) by mouth every 12 (twelve) hours for 20 days.   nystatin (MYCOSTATIN/NYSTOP) powder Apply 1 Application topically 3 (three) times daily.   QUEtiapine (SEROQUEL) 400 MG tablet Take 1 tablet (400 mg total) by mouth at bedtime for 10 days.   No facility-administered encounter medications on file as of 07/04/2023.    Past Medical History:  Diagnosis Date   Cannabis abuse    Paranoid schizophrenia (HCC)    Schizoaffective disorder, depressive type (HCC) 09/17/2014    History reviewed. No pertinent surgical history.  Family History  Problem Relation Age of Onset   Mental illness Brother    Drug abuse Brother    Mental illness Cousin    Suicidality Cousin    Diabetes Maternal Grandmother     Social  History   Socioeconomic History   Marital status: Single    Spouse name: Not on file   Number of children: Not on file   Years of education: Not on file   Highest education level: Not on file  Occupational History   Not on file  Tobacco Use   Smoking status: Some Days    Current packs/day: 0.25    Types: Cigarettes    Passive exposure: Never   Smokeless tobacco: Never  Vaping Use   Vaping status: Some Days   Substances: Nicotine, Flavoring  Substance and Sexual Activity   Alcohol use: Yes    Alcohol/week: 2.0 standard drinks of alcohol    Types: 2 Cans of beer per week   Drug use: Yes    Types: Marijuana, MDMA (Ecstacy)   Sexual activity: Yes  Other Topics Concern   Not on file  Social History Narrative   Not on file   Social Drivers of Health   Financial Resource Strain: Low Risk  (12/31/2019)   Overall Financial Resource Strain (CARDIA)    Difficulty of Paying Living Expenses: Not hard at all  Food Insecurity: No Food Insecurity (12/31/2019)   Hunger Vital Sign    Worried About Running Out of Food in the Last Year: Never true    Ran Out of  Food in the Last Year: Never true  Transportation Needs: No Transportation Needs (12/31/2019)   PRAPARE - Administrator, Civil Service (Medical): No    Lack of Transportation (Non-Medical): No  Physical Activity: Inactive (12/31/2019)   Exercise Vital Sign    Days of Exercise per Week: 0 days    Minutes of Exercise per Session: 0 min  Stress: Stress Concern Present (12/31/2019)   [employer] of Occupational Health - Occupational Stress Questionnaire    Feeling of Stress : Rather much  Social Connections: Moderately Isolated (12/31/2019)   Social Connection and Isolation Panel [NHANES]    Frequency of Communication with Friends and Family: Once a week    Frequency of Social Gatherings with Friends and Family: Once a week    Attends Religious Services: More than 4 times per year    Active Member of [employer] or  Organizations: Yes    Attends Banker Meetings: More than 4 times per year    Marital Status: Never married  Intimate Partner Violence: Not At Risk (12/31/2019)   Humiliation, Afraid, Rape, and Kick questionnaire    Fear of Current or Ex-Partner: No    Emotionally Abused: No    Physically Abused: No    Sexually Abused: No    Review of Systems  Constitutional:  Negative for chills and fever.  HENT:  Negative for congestion, ear discharge, ear pain, hearing loss and tinnitus.   Eyes: Negative.   Respiratory:  Negative for cough and shortness of breath.   Cardiovascular:  Negative for chest pain.  Gastrointestinal: Negative.   Genitourinary: Negative.   Musculoskeletal: Negative.   Skin:  Positive for itching and rash.  Neurological: Negative.   Endo/Heme/Allergies: Negative.         Objective    BP 107/67 (BP Location: Left Arm, Patient Position: Sitting, Cuff Size: Large)   Ht 6' 3.5" (1.918 m)   Wt 236 lb (107 kg)   SpO2 97%   BMI 29.11 kg/m   Physical Exam Vitals and nursing note reviewed.  Constitutional:      Appearance: Normal appearance.  HENT:     Head: Normocephalic and atraumatic.     Right Ear: External ear normal.     Left Ear: External ear normal.     Nose: Nose normal.     Mouth/Throat:     Mouth: Mucous membranes are moist.     Pharynx: Oropharynx is clear.  Eyes:     Extraocular Movements: Extraocular movements intact.     Conjunctiva/sclera: Conjunctivae normal.     Pupils: Pupils are equal, round, and reactive to light.  Cardiovascular:     Rate and Rhythm: Normal rate and regular rhythm.     Pulses: Normal pulses.     Heart sounds: Normal heart sounds.  Pulmonary:     Effort: Pulmonary effort is normal.     Breath sounds: Normal breath sounds.  Musculoskeletal:     Cervical back: Normal range of motion and neck supple.  Skin:    General: Skin is warm.     Findings: Rash present. Rash is vesicular.     Comments: Scattered  tiny vesicles in different stages of healing noted on chest, back, arms, cheeks and forehead.  Neurological:     General: No focal deficit present.     Mental Status: He is alert and oriented to person, place, and time.  Psychiatric:        Mood and Affect: Mood normal.  Behavior: Behavior normal.        Thought Content: Thought content normal.        Judgment: Judgment normal.         Assessment & Plan:   Problem List Items Addressed This Visit       Other   Homelessness   Other Visit Diagnoses       Dermatitis    -  Primary   Relevant Medications   triamcinolone cream (KENALOG) 0.1 %     Needs flu shot       Relevant Orders   Flu vaccine trivalent PF, 6mos and older(Flulaval,Afluria,Fluarix,Fluzone) (Completed)     Cerumen in auditory canal on examination         1. Dermatitis (Primary) Trial Kenalog.  Patient education given on supportive care.  Red flags given for prompt reevaluation. - triamcinolone cream (KENALOG) 0.1 %; Apply 1 Application topically 2 (two) times daily.  Dispense: 30 g; Refill: 0  2. Needs flu shot  - Flu vaccine trivalent PF, 6mos and older(Flulaval,Afluria,Fluarix,Fluzone)  3. Homelessness    I have reviewed the patient's medical history (PMH, PSH, Social History, Family History, Medications, and allergies) , and have been updated if relevant. I spent 25 minutes reviewing chart and  face to face time with patient.     Return if symptoms worsen or fail to improve.   Kasandra Knudsen Mayers, PA-C

## 2023-07-04 NOTE — Patient Instructions (Addendum)
To help with your rash you are going to use Kenalog cream on the affected area twice a day.  To help with your wax buildup in your left ear you will purchase Debrox over-the-counter and use it as directed.  Please let us know if there is anything else we can do for you  Roney Jaffe, PA-C Physician Assistant [facility] [email or web address]   Rash, Adult A rash is a breakout of spots or blotches on the skin. It can affect the way the skin looks and feels. Many things can cause a rash. Common causes include: Viral infections. These include colds, measles, and hand, foot, and mouth disease. Bacterial infections. These include scarlet fever and impetigo. Fungal infections. These include athlete's foot, ringworm, and yeast rashes. Skin irritation. This may be from heat rash, exposure to moisture or friction for a long time (intertrigo), or exposure to soap or skin care products (eczema). Allergic reactions. These may be caused by foods, medicines, or things like poison ivy. Some rashes may go away after a few days. Others may last for a few weeks. The goal of treatment is to stop the itching and keep the rash from spreading. Follow these instructions at home: Medicine Take or apply over-the-counter and prescription medicines only as told by your health care provider. These may include: Corticosteroids. These can help treat red or swollen skin. They may be given as creams or as medicines to take by mouth (orally). Anti-itch lotions. Allergy medicines. Pain medicine. Antifungal medicine if the rash is from a fungal infection. Antibiotics if you have an infection.  Skin care Apply cool, wet cloths (compresses) to the affected areas. Do not scratch or rub your skin. Avoid covering the rash. Keep it exposed to air as often as you can. Managing itching and discomfort Avoid hot showers and baths. These can make itching worse. A cold  shower may help. Try taking a bath with: Epsom salts. You can get these at your local pharmacy or grocery store. Follow the instructions on the package. Baking soda. Pour a small amount into the bath as told by your provider. Colloidal oatmeal. You can get this at your local pharmacy or grocery store. Follow the instructions on the package. Try putting baking soda paste on your skin. Stir water into baking soda until it becomes like a paste. Try using calamine lotion or cortisone cream to help with itchiness. Keep cool. Stay out of the sun. Sweating and being hot can make itching worse. General instructions  Rest as needed. Drink enough fluid to keep your pee (urine) pale yellow. Wear loose-fitting clothes. Avoid scented soaps, detergents, and perfumes. Use gentle soaps, detergents, perfumes, and cosmetics. Avoid the things that cause your rash (triggers). Keep a journal to help keep track of your triggers. Write down: What you eat. What cosmetics you use. What you drink. What you wear. This includes jewelry. Contact a health care provider if: You sweat at night more than normal. You pee (urinate) more or less than normal, or your pee is a darker color than normal. Your eyes become sensitive to light. Your skin or the white parts of your eyes turn yellow (jaundice). Your skin tingles or is numb. You get painful blisters in your nose or mouth. Your rash does not go away after a few days, or it gets worse. You are more tired or thirsty than normal. You have new or worse symptoms. These may include: Pain in your abdomen. Fever. Diarrhea or vomiting. Weakness or  weight loss. Get help right away if: You get confused. You have a severe headache, a stiff neck, or severe joint pain or stiffness. You become very sleepy or not responsive. You have a seizure. This information is not intended to replace advice given to you by your health care provider. Make sure you discuss any questions  you have with your health care provider. Document Revised: 04/09/2022 Document Reviewed: 04/09/2022 Elsevier Patient Education  2024 [employer].

## 2023-07-04 NOTE — Congregational Nurse Program (Signed)
  Dept: [phone]   Congregational Nurse Program Note  Date of Encounter: 07/04/2023  Past Medical History: Past Medical History:  Diagnosis Date   Cannabis abuse    Paranoid schizophrenia (HCC)    Schizoaffective disorder, depressive type (HCC) 09/17/2014    Encounter Details:  Community Questionnaire - 07/04/23 1102       Questionnaire   Ask client: Do you give verbal consent for me to treat you today? Yes    Student Assistance N/A    Location Patient Served  [facility]    Encounter Setting CN site    Population Status Unhoused    Insurance Medicaid    Insurance/Financial Assistance Referral N/A    Medication N/A    Medical Provider No    Screening Referrals Made N/A    Medical Referrals Made [facility]    Medical Appointment Completed [facility]    CNP Interventions Advocate/Support;Navigate Healthcare System;Case Management    Screenings CN Performed N/A    ED Visit Averted N/A    Life-Saving Intervention Made N/A            Client came to nurse's office requesting help with a rash on his back and chest. He is also requesting a flu vaccine. Client missed his appt with [facility] on Dec 10th to establish a PCP. Made an appt to establish PCP with [facility] on [facility] March 4th 0800 with Dr Andrey Campanile and gave client written information. Contacted [facility] and they are providing flu vaccines. Sending client to [facility] today for skin evaluation and flu vaccine.  Alwilda Gilland W RN CN

## 2023-07-05 ENCOUNTER — Telehealth: Payer: Self-pay

## 2023-07-05 ENCOUNTER — Encounter: Payer: Self-pay | Admitting: Physician Assistant

## 2023-07-05 NOTE — Telephone Encounter (Signed)
 Wellness check call placed to follow up with patient on Mental Health status. Patient was seen on [facility] on 12.30.24 and expressed mental health concerns. Patient has a history of mental health Illnesses such as Schizoaffective disorder, bipolar type , Schizoaffective disorder, and depression. Patient is also facing social challenges as homelessness. His assessment indicated that thoughts of harming self or thoughts better off dead is a nearly everyday thought. I was unable to reach patient at this time. A vm was left to return call to discuss some possible options available through [facility] mental health support. The provider was also informed of screening score.

## 2023-09-03 ENCOUNTER — Emergency Department (HOSPITAL_COMMUNITY)
Admission: EM | Admit: 2023-09-03 | Discharge: 2023-09-03 | Disposition: A | Discharge status: Home / Self Care | Payer: MEDICAID | Attending: Emergency Medicine | Admitting: Emergency Medicine

## 2023-09-03 ENCOUNTER — Encounter (HOSPITAL_COMMUNITY): Payer: Self-pay | Admitting: Emergency Medicine

## 2023-09-03 ENCOUNTER — Other Ambulatory Visit: Payer: Self-pay

## 2023-09-03 DIAGNOSIS — Z5900 Homelessness unspecified: Secondary | ICD-10-CM | POA: Insufficient documentation

## 2023-09-03 DIAGNOSIS — W57XXXA Bitten or stung by nonvenomous insect and other nonvenomous arthropods, initial encounter: Secondary | ICD-10-CM | POA: Insufficient documentation

## 2023-09-03 DIAGNOSIS — R21 Rash and other nonspecific skin eruption: Secondary | ICD-10-CM | POA: Diagnosis present

## 2023-09-03 MED ORDER — METHYLPREDNISOLONE SODIUM SUCC 125 MG IJ SOLR: 125.0000 mg | Freq: Once | INTRAMUSCULAR | Status: DC

## 2023-09-03 MED ORDER — PREDNISONE 20 MG PO TABS
60.0000 mg | ORAL_TABLET | Freq: Once | ORAL | Status: AC
  Administered 2023-09-03: 60 mg via ORAL
  Filled 2023-09-03: qty 3

## 2023-09-03 MED ORDER — DIPHENHYDRAMINE HCL 50 MG/ML IJ SOLN: 25.0000 mg | Freq: Once | INTRAMUSCULAR | Status: DC

## 2023-09-03 MED ORDER — DIPHENHYDRAMINE HCL 25 MG PO CAPS
25.0000 mg | ORAL_CAPSULE | Freq: Once | ORAL | Status: AC
  Administered 2023-09-03: 25 mg via ORAL
  Filled 2023-09-03: qty 1

## 2023-09-03 MED ORDER — DIPHENHYDRAMINE HCL 25 MG PO TABS: 25.0000 mg | ORAL_TABLET | Freq: Four times a day (QID) | ORAL | 0 refills | Status: AC | PRN

## 2023-09-03 NOTE — ED Provider Notes (Signed)
 [facility] AT [facility] Provider Note   CSN: [id number] Arrival date & time: 09/03/23  4098     History  Chief Complaint  Patient presents with   Insect Bite    Timothy [name] is a 28 y.o. male with a history of paranoid schizophrenia, homelessness, and substance abuse disorder who presents the ED today for rash.  Patient was brought in by EMS for possible bedbug bites.  He was being arrested by [employer] for assault last night when he started complaining about being itchy.  EMS was called and one paramedic believe that they saw a bedbug on him while another one did not.  He was brought here for further evaluation.  Patient endorses itching throughout his body and associated shortness of breath.  Patient is able to speak in full sentences. Denies throat closing sensation, inability to swallow secretions, or chest tightness. He states that he has been staying in a motel. No new soaps or detergents.  No additional complaints or concerns at this time.    Home Medications Prior to Admission medications   Medication Sig Start Date End Date Taking? Authorizing Provider  diphenhydrAMINE (BENADRYL) 25 MG tablet Take 1 tablet (25 mg total) by mouth every 6 (six) hours as needed for up to 7 days. 09/03/23 09/10/23 Yes Maxwell Marion, PA-C  buPROPion (WELLBUTRIN XL) 300 MG 24 hr tablet Take 1 tablet (300 mg total) by mouth daily for 10 days. 01/06/22 01/16/22  Charm Rings, NP  divalproex (DEPAKOTE) 500 MG DR tablet Take 1 tablet (500 mg total) by mouth every 12 (twelve) hours for 20 days. 01/06/22 01/26/22  Charm Rings, NP  nystatin (MYCOSTATIN/NYSTOP) powder Apply 1 Application topically 3 (three) times daily. 05/23/23   Palumbo, April, MD  QUEtiapine (SEROQUEL) 400 MG tablet Take 1 tablet (400 mg total) by mouth at bedtime for 10 days. 01/06/22 01/16/22  Charm Rings, NP  triamcinolone cream (KENALOG) 0.1 % Apply 1 Application topically 2 (two) times daily. 07/04/23   Mayers,  Cari S, PA-C      Allergies    Haldol [haloperidol lactate], Penicillins, and Zyprexa [olanzapine]    Review of Systems   Review of Systems  Skin:  Positive for wound.  All other systems reviewed and are negative.   Physical Exam Updated Vital Signs BP 106/71   Pulse (!) 57   Temp 97.8 F (36.6 C) (Oral)   Resp 15   Ht 6' 3.5" (1.918 m)   Wt 115 kg   SpO2 94%   BMI 31.27 kg/m  Physical Exam Vitals and nursing note reviewed. Exam conducted with a chaperone present.  Constitutional:      General: He is not in acute distress.    Appearance: Normal appearance.  HENT:     Head: Normocephalic and atraumatic.     Mouth/Throat:     Mouth: Mucous membranes are moist.     Pharynx: Oropharynx is clear.     Comments: No swelling of the oropharynx, soft palate, or angioedema Eyes:     Conjunctiva/sclera: Conjunctivae normal.     Pupils: Pupils are equal, round, and reactive to light.  Cardiovascular:     Rate and Rhythm: Normal rate and regular rhythm.     Pulses: Normal pulses.     Heart sounds: Normal heart sounds.  Pulmonary:     Effort: Pulmonary effort is normal.     Breath sounds: Normal breath sounds.  Abdominal:     Palpations: Abdomen  is soft.     Tenderness: There is no abdominal tenderness.  Musculoskeletal:        General: Normal range of motion.     Cervical back: Normal range of motion.  Skin:    General: Skin is warm and dry.     Findings: Rash present.     Comments: Raised rash present volar aspect of forearms and chest. No rash on wrists or between fingers  Neurological:     General: No focal deficit present.     Mental Status: He is alert.  Psychiatric:        Mood and Affect: Mood normal.        Behavior: Behavior normal.    ED Results / Procedures / Treatments   Labs (all labs ordered are listed, but only abnormal results are displayed) Labs Reviewed - No data to display  EKG None  Radiology No results found.  Procedures Procedures:  not indicated.   Medications Ordered in ED Medications  predniSONE (DELTASONE) tablet 60 mg (60 mg Oral Given 09/03/23 0727)  diphenhydrAMINE (BENADRYL) capsule 25 mg (25 mg Oral Given 09/03/23 0727)    ED Course/ Medical Decision Making/ A&P                                 Medical Decision Making  This patient presents to the ED for concern of bug bites, this involves an extensive number of treatment options, and is a complaint that carries with it a high risk of complications and morbidity.   Differential diagnosis includes: urticaria, atopic dermatitis, contact dermatitis, scabies, drug eruption, etc.   Comorbidities  See HPI above   Additional History  Additional history obtained from prior records   Problem List / ED Course / Critical Interventions / Medication Management  Patient was brought in by EMS for itching and possible bedbug exposure.  Apparently patient has been staying at a motel and last night was going to be arrested by [facility] for assault with he started to complain of itching.  One paramedic believes they saw a bed bug patient en route. Nurse came with me as a chaperone for examination.  Patient has a raised rash on chest, neck, and volar aspect of bilateral arms.  He denies shortness of breath at the triage nurse but endorses it to me.  He notes started last night.  No difficulty swallowing, angioedema, or swelling of the oropharynx.  Patient's airway is clear on exam and he's in no acute distress. Nurse found a bedbug crawling on patient's jacket during examination. I ordered medications including: Prednisone and Benadryl for rash - given prior to discharge Sent prescription of Benadryl to patient's pharmacy for him to take for the next several days to help with the rash.  States that he is not going back to the motel the bedbugs were.   Social Determinants of Health  Housing    Test / Admission - Considered  Patient is stable and safe for discharge  home. Return precautions provided.       Final Clinical Impression(s) / ED Diagnoses Final diagnoses:  Bedbug bite, initial encounter    Rx / DC Orders ED Discharge Orders          Ordered    diphenhydrAMINE (BENADRYL) 25 MG tablet  Every 6 hours PRN        09/03/23 0711  Maxwell Marion, PA-C 09/03/23 0981    Benjiman Core, MD 09/03/23 [phone]

## 2023-09-03 NOTE — ED Triage Notes (Signed)
 Pt presents to the ED via [facility] with complaints of "bed bug bites". Per EMS, a bed bug was found on the patient - none visible at this time. Endorses itching all over. A&Ox4 at this time. Denies CP or SOB.

## 2023-09-03 NOTE — Discharge Instructions (Signed)
 As discussed, take Benadryl 25 mg every 6 hours as needed for itchiness.  Avoid scratching the bug bites to prevent infection. First dose was given to you in the ED today as well as steroid shot to further help with your symptoms.  The hotel room where you were at needs to be exterminated.  Get help right away if: You have joint pain. You feel weak or more tired than you normally do. You have neck pain or a headache. You have signs of an anaphylactic reaction. Signs may include: Swelling of your eyes, lips, face, mouth, tongue, or throat. Feeling warm in the face. Itchy, red, swollen areas of skin. Trouble with breathing, talking, or swallowing. Wheezing. Feeling dizzy or light-headed. Fainting. Pain or cramps in your belly. Vomiting or watery poop.

## 2023-09-06 ENCOUNTER — Telehealth: Payer: Self-pay

## 2023-09-06 ENCOUNTER — Ambulatory Visit: Payer: MEDICAID | Admitting: Family Medicine

## 2023-09-06 ENCOUNTER — Ambulatory Visit (HOSPITAL_COMMUNITY)
Admission: EM | Admit: 2023-09-06 | Discharge: 2023-09-07 | Disposition: A | Discharge status: Other Acute Inpatient Hospital | Attending: Psychiatry | Admitting: Psychiatry

## 2023-09-06 DIAGNOSIS — F251 Schizoaffective disorder, depressive type: Secondary | ICD-10-CM

## 2023-09-06 DIAGNOSIS — Z79899 Other long term (current) drug therapy: Secondary | ICD-10-CM | POA: Insufficient documentation

## 2023-09-06 DIAGNOSIS — F259 Schizoaffective disorder, unspecified: Secondary | ICD-10-CM | POA: Insufficient documentation

## 2023-09-06 DIAGNOSIS — R45851 Suicidal ideations: Secondary | ICD-10-CM | POA: Insufficient documentation

## 2023-09-06 DIAGNOSIS — R443 Hallucinations, unspecified: Secondary | ICD-10-CM | POA: Diagnosis not present

## 2023-09-06 DIAGNOSIS — Z5900 Homelessness unspecified: Secondary | ICD-10-CM | POA: Diagnosis not present

## 2023-09-06 DIAGNOSIS — F322 Major depressive disorder, single episode, severe without psychotic features: Secondary | ICD-10-CM | POA: Diagnosis not present

## 2023-09-06 LAB — COMPREHENSIVE METABOLIC PANEL
ALT: 15 U/L (ref 0–44)
AST: 18 U/L (ref 15–41)
Albumin: 4.1 g/dL (ref 3.5–5.0)
Alkaline Phosphatase: 39 U/L (ref 38–126)
Anion gap: 9 (ref 5–15)
BUN: 10 mg/dL (ref 6–20)
CO2: 26 mmol/L (ref 22–32)
Calcium: 9.3 mg/dL (ref 8.9–10.3)
Chloride: 103 mmol/L (ref 98–111)
Creatinine, Ser: 1.1 mg/dL (ref 0.61–1.24)
GFR, Estimated: >60 mL/min (ref >60)
Glucose, Bld: 97 mg/dL (ref 70–99)
Potassium: 3.9 mmol/L (ref 3.5–5.1)
Sodium: 138 mmol/L (ref 135–145)
Total Bilirubin: 0.9 mg/dL (ref 0.0–1.2)
Total Protein: 7.1 g/dL (ref 6.5–8.1)

## 2023-09-06 LAB — POCT URINE DRUG SCREEN - MANUAL ENTRY (I-SCREEN)
POC Amphetamine UR: NOT DETECTED
POC Buprenorphine (BUP): NOT DETECTED
POC Cocaine UR: NOT DETECTED
POC Marijuana UR: POSITIVE — AB
POC Methadone UR: NOT DETECTED
POC Methamphetamine UR: NOT DETECTED
POC Morphine: NOT DETECTED
POC Oxazepam (BZO): NOT DETECTED
POC Oxycodone UR: NOT DETECTED
POC Secobarbital (BAR): NOT DETECTED

## 2023-09-06 LAB — URINALYSIS, ROUTINE W REFLEX MICROSCOPIC
Bilirubin Urine: NEGATIVE
Glucose, UA: NEGATIVE mg/dL
Hgb urine dipstick: NEGATIVE
Ketones, ur: NEGATIVE mg/dL
Leukocytes,Ua: NEGATIVE
Nitrite: NEGATIVE
Protein, ur: NEGATIVE mg/dL
Specific Gravity, Urine: 1.031 — ABNORMAL HIGH (ref 1.005–1.030)
pH: 5 (ref 5.0–8.0)

## 2023-09-06 LAB — CBC WITH DIFFERENTIAL/PLATELET
Abs Immature Granulocytes: 0.01 10*3/uL (ref 0.00–0.07)
Basophils Absolute: 0 10*3/uL (ref 0.0–0.1)
Basophils Relative: 1 %
Eosinophils Absolute: 0.1 10*3/uL (ref 0.0–0.5)
Eosinophils Relative: 2 %
HCT: 46.3 % (ref 39.0–52.0)
Hemoglobin: 15.1 g/dL (ref 13.0–17.0)
Immature Granulocytes: 0 %
Lymphocytes Relative: 42 %
Lymphs Abs: 2.3 10*3/uL (ref 0.7–4.0)
MCH: 29.7 pg (ref 26.0–34.0)
MCHC: 32.6 g/dL (ref 30.0–36.0)
MCV: 91.1 fL (ref 80.0–100.0)
Monocytes Absolute: 0.6 10*3/uL (ref 0.1–1.0)
Monocytes Relative: 11 %
Neutro Abs: 2.4 10*3/uL (ref 1.7–7.7)
Neutrophils Relative %: 44 %
Platelets: 216 10*3/uL (ref 150–400)
RBC: 5.08 MIL/uL (ref 4.22–5.81)
RDW: 12.9 % (ref 11.5–15.5)
WBC: 5.4 10*3/uL (ref 4.0–10.5)
nRBC: 0 % (ref 0.0–0.2)

## 2023-09-06 LAB — HEMOGLOBIN A1C
Hgb A1c MFr Bld: 5.2 % (ref 4.8–5.6)
Mean Plasma Glucose: 102.54 mg/dL

## 2023-09-06 LAB — LIPID PANEL
Cholesterol: 191 mg/dL (ref 0–200)
HDL: 53 mg/dL (ref >40)
LDL Cholesterol: 112 mg/dL — ABNORMAL HIGH (ref 0–99)
Total CHOL/HDL Ratio: 3.6 ratio
Triglycerides: 129 mg/dL (ref <150)
VLDL: 26 mg/dL (ref 0–40)

## 2023-09-06 LAB — ETHANOL: Alcohol, Ethyl (B): <10 mg/dL (ref <10)

## 2023-09-06 LAB — MAGNESIUM: Magnesium: 2.4 mg/dL (ref 1.7–2.4)

## 2023-09-06 LAB — TSH: TSH: 0.789 u[IU]/mL (ref 0.350–4.500)

## 2023-09-06 MED ORDER — LORAZEPAM 2 MG/ML IJ SOLN: 2.0000 mg | Freq: Two times a day (BID) | INTRAMUSCULAR | Status: DC | PRN

## 2023-09-06 MED ORDER — HYDROXYZINE HCL 25 MG PO TABS
25.0000 mg | ORAL_TABLET | Freq: Three times a day (TID) | ORAL | Status: DC | PRN
  Administered 2023-09-07: 25 mg via ORAL
  Filled 2023-09-06: qty 1

## 2023-09-06 MED ORDER — TRAZODONE HCL 50 MG PO TABS: 50.0000 mg | ORAL_TABLET | Freq: Every evening | ORAL | Status: DC | PRN

## 2023-09-06 MED ORDER — MAGNESIUM HYDROXIDE 400 MG/5ML PO SUSP: 30.0000 mL | Freq: Every day | ORAL | Status: DC | PRN

## 2023-09-06 MED ORDER — ZIPRASIDONE MESYLATE 20 MG IM SOLR
20.0000 mg | Freq: Two times a day (BID) | INTRAMUSCULAR | Status: DC | PRN
Start: 2023-09-06 — End: 2023-09-07

## 2023-09-06 MED ORDER — ACETAMINOPHEN 325 MG PO TABS: 650.0000 mg | ORAL_TABLET | Freq: Four times a day (QID) | ORAL | Status: DC | PRN

## 2023-09-06 MED ORDER — ALUM & MAG HYDROXIDE-SIMETH 200-200-20 MG/5ML PO SUSP: 30.0000 mL | ORAL | Status: DC | PRN

## 2023-09-06 NOTE — ED Provider Notes (Addendum)
 [facility] Urgent Care Continuous Assessment Admission H&P  Date: 09/06/23 Patient Name: Timothy [name] MRN: [id number] Chief Complaint: "I had suicidal thoughts and was seeing and hearing things that are not there"  Diagnoses:  Final diagnoses:  Hallucinations    HPI:  Timothy [name] is a 28 year-old male who presents to [facility] voluntarilly, accompanied by his counselor from [facility]. Patient reports "I was suicidal, and I was seeing things and hearing things that are not there". Patient presents with a hx of Schizoaffective disorder, Depressive  type. He was last hospitalized at [facility] in June 2023 under Dr Toni Amend and was discharged on the following medications:   buPROPion 300 MG 24 hr table PO Daily divalproex 500 MG DR tablet PO  Q 12 hrs hydrOXYzine 50 MG tablet  PO TID PRN QUEtiapine 400 MG tablet  PO HS  Patient reports that he has been taking his medications as prescribed. He reports that his visit today was precipitated by an altercation with a peer at [facility]  "I wanted to fight a guy there, but I am alright now. Patient reports that he has been struggling with employment search "trying to get myself situated". He reports that he used to have an apartment, missed one payment and was evicted. Patient reports a hx of substance use since high school when he was diagnosed with Schizophrenia. He admits to using Marijuana occasionally. Patient reports that he has a Veterinary surgeon at [facility] and she advised him to come here for evaluation after he threatened to fight someone at [facility]. Patient reports that "I had suicidal thoughts, and was seeing and hearing things that are not there, but I am good now". He reports not having any issues taking medications. Reports that he has been applying for employment at [employer], [employer] and sates "I am tired of working at [employer], I want a full time job so I can pay my own apartment". Patient reports that his mother and grandmother are here in [place] but he can not go back there.  He denies medical issues and expresses readiness for discharge, reporting that "I am cool now".   Patient maintained a pleasant attitude during this encounter.  He reported that he is receiving counseling services at [facility] and has no issues with his medications. He denies SI/HI/AVH.  Provider attempted to contact [facility]  to talk to the counselor but the office was already closed.  Patient admitted to Observation unit for overnight monitoring. He will be reevaluated in AM and will possibly be discharged back to [facility]. Provider to contact the counselor at [facility] in  AM.  Total Time spent with patient: 30 minutes  Musculoskeletal  Strength & Muscle Tone: within normal limits Gait & Station: normal Patient leans: N/A  Psychiatric Specialty Exam  Presentation General Appearance:  Casual  Eye Contact: Fair  Speech: Clear and Coherent; Normal Rate  Speech Volume: Normal  Handedness: Right   Mood and Affect  Mood: Anxious  Affect: Congruent   Thought Process  Thought Processes: Coherent  Descriptions of Associations:Intact  Orientation:Full (Time, Place and Person)  Thought Content:Logical  Diagnosis of Schizophrenia or Schizoaffective disorder in past: No data recorded Duration of Psychotic Symptoms: No data recorded Hallucinations:Hallucinations: Auditory; Visual Description of Auditory Hallucinations: "I hear and see things that are not there"  Ideas of Reference:None  Suicidal Thoughts:Suicidal Thoughts: No  Homicidal Thoughts:Homicidal Thoughts: No   Sensorium  Memory: Immediate Fair; Recent Fair; Remote Poor  Judgment: Fair  Insight: Chief Executive Officer  Concentration: Fair  Attention Span: Fair  Recall: [employer] of Knowledge: Fair  Language: Fair   Psychomotor Activity  Psychomotor Activity: Psychomotor Activity: Normal   Assets  Assets: Manufacturing systems engineer; Desire for Improvement; Physical Health   Sleep  Sleep: Sleep:  Fair Number of Hours of Sleep: 7   Nutritional Assessment (For OBS and FBC admissions only) Has the patient had a weight loss or gain of 10 pounds or more in the last 3 months?: No Has the patient had a decrease in food intake/or appetite?: No Does the patient have dental problems?: No Does the patient have eating habits or behaviors that may be indicators of an eating disorder including binging or inducing vomiting?: No Has the patient recently lost weight without trying?: 0 Has the patient been eating poorly because of a decreased appetite?: 0 Malnutrition Screening Tool Score: 0    Physical Exam Vitals and nursing note reviewed.  Constitutional:      Appearance: Normal appearance.  HENT:     Head: Normocephalic and atraumatic.     Right Ear: Tympanic membrane normal.     Left Ear: Tympanic membrane normal.     Nose: Nose normal.     Mouth/Throat:     Mouth: Mucous membranes are moist.  Eyes:     Extraocular Movements: Extraocular movements intact.     Pupils: Pupils are equal, round, and reactive to light.  Cardiovascular:     Rate and Rhythm: Normal rate.     Pulses: Normal pulses.  Pulmonary:     Effort: Pulmonary effort is normal.  Musculoskeletal:        General: Normal range of motion.     Cervical back: Normal range of motion and neck supple.  Neurological:     General: No focal deficit present.     Mental Status: He is alert and oriented to person, place, and time.    Review of Systems  Constitutional: Negative.   HENT: Negative.    Eyes: Negative.   Respiratory: Negative.    Cardiovascular: Negative.   Gastrointestinal: Negative.   Genitourinary: Negative.   Musculoskeletal: Negative.   Skin: Negative.   Neurological: Negative.   Endo/Heme/Allergies: Negative.   Psychiatric/Behavioral:  Positive for depression. The patient is nervous/anxious.     Blood pressure 110/61, pulse 77, temperature 98.4 F (36.9 C), temperature source Oral, resp. rate 16,  SpO2 100%. There is no height or weight on file to calculate BMI.  Past Psychiatric History: Schizoaffective Disorder, Depressive Type   Is the patient at risk to self? No  Has the patient been a risk to self in the past 6 months? Yes .    Has the patient been a risk to self within the distant past? No   Is the patient a risk to others? No   Has the patient been a risk to others in the past 6 months? No   Has the patient been a risk to others within the distant past? No   Past Medical History: NA  Family History: NA  Social History:  resides at [facility] (homeless), looking for employment.   Last Labs:  Admission on 06/27/2023, Discharged on 06/27/2023  Component Date Value Ref Range Status   Color, Urine 06/27/2023 YELLOW  YELLOW Final   APPearance 06/27/2023 CLEAR  CLEAR Final   Specific Gravity, Urine 06/27/2023 1.031 (H)  1.005 - 1.030 Final   pH 06/27/2023 5.0  5.0 - 8.0 Final   Glucose, UA 06/27/2023 NEGATIVE  NEGATIVE mg/dL  Final   Hgb urine dipstick 06/27/2023 NEGATIVE  NEGATIVE Final   Bilirubin Urine 06/27/2023 NEGATIVE  NEGATIVE Final   Ketones, ur 06/27/2023 5 (A)  NEGATIVE mg/dL Final   Protein, ur 40/98/1191 30 (A)  NEGATIVE mg/dL Final   Nitrite 47/82/9562 NEGATIVE  NEGATIVE Final   Leukocytes,Ua 06/27/2023 NEGATIVE  NEGATIVE Final   RBC / HPF 06/27/2023 0-5  0 - 5 RBC/hpf Final   WBC, UA 06/27/2023 0-5  0 - 5 WBC/hpf Final   Bacteria, UA 06/27/2023 NONE SEEN  NONE SEEN Final   Squamous Epithelial / HPF 06/27/2023 0-5  0 - 5 /HPF Final   Mucus 06/27/2023 PRESENT   Final   Hyaline Casts, UA 06/27/2023 PRESENT   Final   Performed at [facility], [address], [place], [place] [address]   Glucose-Capillary 06/27/2023 135 (H)  70 - 99 mg/dL Final   Glucose reference range applies only to samples taken after fasting for at least 8 hours.   Alcohol, Ethyl (B) 06/27/2023 <10  <10 mg/dL Final   Comment: (NOTE) Lowest detectable limit for serum alcohol  is 10 mg/dL.  For medical purposes only. Performed at [facility], [address], [place], [place] [address]    Opiates 06/27/2023 NONE DETECTED  NONE DETECTED Final   Cocaine 06/27/2023 NONE DETECTED  NONE DETECTED Final   Benzodiazepines 06/27/2023 NONE DETECTED  NONE DETECTED Final   Amphetamines 06/27/2023 NONE DETECTED  NONE DETECTED Final   Tetrahydrocannabinol 06/27/2023 POSITIVE (A)  NONE DETECTED Final   Barbiturates 06/27/2023 NONE DETECTED  NONE DETECTED Final   Comment: (NOTE) DRUG SCREEN FOR MEDICAL PURPOSES ONLY.  IF CONFIRMATION IS NEEDED FOR ANY PURPOSE, NOTIFY LAB WITHIN 5 DAYS.  LOWEST DETECTABLE LIMITS FOR URINE DRUG SCREEN Drug Class                     Cutoff (ng/mL) Amphetamine and metabolites    1000 Barbiturate and metabolites    200 Benzodiazepine                 200 Opiates and metabolites        300 Cocaine and metabolites        300 THC                            50 Performed at [facility], [address], [place], [place] [address]    Sodium 06/27/2023 137  135 - 145 mmol/L Final   Potassium 06/27/2023 3.6  3.5 - 5.1 mmol/L Final   Chloride 06/27/2023 104  98 - 111 mmol/L Final   CO2 06/27/2023 22  22 - 32 mmol/L Final   Glucose, Bld 06/27/2023 86  70 - 99 mg/dL Final   Glucose reference range applies only to samples taken after fasting for at least 8 hours.   BUN 06/27/2023 18  6 - 20 mg/dL Final   Creatinine, Ser 06/27/2023 1.13  0.61 - 1.24 mg/dL Final   Calcium 28/41/3244 9.3  8.9 - 10.3 mg/dL Final   Total Protein 07/07/7251 7.7  6.5 - 8.1 g/dL Final   Albumin 66/44/0347 4.3  3.5 - 5.0 g/dL Final   AST 42/59/5638 27  15 - 41 U/L Final   ALT 06/27/2023 21  0 - 44 U/L Final   Alkaline Phosphatase 06/27/2023 37 (L)  38 - 126 U/L Final   Total Bilirubin 06/27/2023 1.2 (H)  <1.2  mg/dL Final   GFR, Estimated 06/27/2023 >60  >60 mL/min Final   Comment: (NOTE) Calculated using the CKD-EPI Creatinine  Equation (2021)    Anion gap 06/27/2023 11  5 - 15 Final   Performed at [facility], [address] [place], [place] [address]   WBC 06/27/2023 6.6  4.0 - 10.5 K/uL Final   RBC 06/27/2023 4.93  4.22 - 5.81 MIL/uL Final   Hemoglobin 06/27/2023 15.3  13.0 - 17.0 g/dL Final   HCT 65/78/4696 45.2  39.0 - 52.0 % Final   MCV 06/27/2023 91.7  80.0 - 100.0 fL Final   MCH 06/27/2023 31.0  26.0 - 34.0 pg Final   MCHC 06/27/2023 33.8  30.0 - 36.0 g/dL Final   RDW 29/52/8413 12.7  11.5 - 15.5 % Final   Platelets 06/27/2023 219  150 - 400 K/uL Final   nRBC 06/27/2023 0.0  0.0 - 0.2 % Final   Neutrophils Relative % 06/27/2023 60  % Final   Neutro Abs 06/27/2023 4.0  1.7 - 7.7 K/uL Final   Lymphocytes Relative 06/27/2023 28  % Final   Lymphs Abs 06/27/2023 1.8  0.7 - 4.0 K/uL Final   Monocytes Relative 06/27/2023 11  % Final   Monocytes Absolute 06/27/2023 0.7  0.1 - 1.0 K/uL Final   Eosinophils Relative 06/27/2023 1  % Final   Eosinophils Absolute 06/27/2023 0.0  0.0 - 0.5 K/uL Final   Basophils Relative 06/27/2023 0  % Final   Basophils Absolute 06/27/2023 0.0  0.0 - 0.1 K/uL Final   Immature Granulocytes 06/27/2023 0  % Final   Abs Immature Granulocytes 06/27/2023 0.01  0.00 - 0.07 K/uL Final   Performed at [facility], [address], [place], [place] [address]    Allergies: Haldol [haloperidol lactate], Penicillins, and Zyprexa [olanzapine]  Medications:  PTA Medications  Medication Sig   buPROPion (WELLBUTRIN XL) 300 MG 24 hr tablet Take 1 tablet (300 mg total) by mouth daily for 10 days.   QUEtiapine (SEROQUEL) 400 MG tablet Take 1 tablet (400 mg total) by mouth at bedtime for 10 days.   divalproex (DEPAKOTE) 500 MG DR tablet Take 1 tablet (500 mg total) by mouth every 12 (twelve) hours for 20 days.   nystatin (MYCOSTATIN/NYSTOP) powder Apply 1 Application topically 3 (three) times daily.   triamcinolone cream (KENALOG) 0.1 % Apply 1 Application  topically 2 (two) times daily.   diphenhydrAMINE (BENADRYL) 25 MG tablet Take 1 tablet (25 mg total) by mouth every 6 (six) hours as needed for up to 7 days.      Medical Decision Making  Admit to Observation unit for overnight monitoring. To be reevaluated in AM with possible discharge.   Medications:   Acetaminophen 650 mg PO Q6 PRN Maalox 30 mg PO QID PRN Milk of Magnesia 30 ml PO Daily PRN Trazodone 50 mg PO HS PRN Hydroxyzine 25 mg PO TID PRN Geodon 20 mg IM Q 12 hrs PRN, agitations Ativan 2 mg IM Q 12, agitations  Labs:  CBC, CMP, RPR, THS, A1C, UDS, UA, Ethanol, Hepatic Function Panel, Lipid panel, Magnesium  Recommendations  Based on my evaluation the patient does not appear to have an emergency medical condition.  Olin Pia, NP 09/06/23  4:30 PM

## 2023-09-06 NOTE — Progress Notes (Signed)
   09/06/23 1344  [facility] Triage Screening (Walk-ins at [facility] only)  How Did You Hear About [place]? Other (Comment)  What Is the Reason for Your Visit/Call Today? Timothy [name] presents to [facility] voluntarily accompanied by a counselor from [facility]. Pt states that he had SI thoughts today with the plan to overdose. Pt states that he had HI thoughts a couple days ago. Pt currently denies SI, HI, AH and alcohol use. Pt states that he has VH where he sees spots moving & people that aren't there. Pt states that he took a couple pulls off a prerolled joint of marijuana this morning. Pt states that he needs a care provider to assist him because he has back and leg problems.  How Long Has This Been Causing You Problems? <Week  Have You Recently Had Any Thoughts About Hurting Yourself? Yes  How long ago did you have thoughts about hurting yourself? today - plan to overdose on his medication  Are You Planning to Commit Suicide/Harm Yourself At This time? No  Have you Recently Had Thoughts About Hurting Someone Timothy [name]? Yes  How long ago did you have thoughts of harming others? couple days ago  Are You Planning To Harm Someone At This Time? No  Physical Abuse Denies  Verbal Abuse Denies  Sexual Abuse Denies  Exploitation of patient/patient's resources Yes, past (Comment);Yes, present (Comment)  Self-Neglect Denies  Are you currently experiencing any auditory, visual or other hallucinations? Yes  Please explain the hallucinations you are currently experiencing: Visual - spots moving, people that aren't there  Have You Used Any Alcohol or Drugs in the Past 24 Hours? Yes  What Did You Use and How Much? this morning - marijuana (a couple pulls)  Do you have any current medical co-morbidities that require immediate attention? No  Clinician description of patient physical appearance/behavior: calm, cooperative, looking up in his head when answering questions  What Do You Feel Would Help You the Most Today? Social Support  If  access to [facility] Urgent [facility] was not available, would you have sought care in the Emergency Department? No  Determination of Need Routine (7 days)  Options For Referral Medication Management;Outpatient Therapy

## 2023-09-06 NOTE — Congregational Nurse Program (Signed)
 RN received referral for client via [facility]. They state client is having suicidal ideation and is in need of safe transport to [facility] to be evaluated by staff. RN is coordinating transportation care for client.

## 2023-09-06 NOTE — ED Notes (Signed)
 Pt was searched with no contraband found.   Belongings were locked in locker 26.   Pt oriented to the unit and given dinner.

## 2023-09-06 NOTE — Telephone Encounter (Signed)
 Called patient left voicemail to call office back if he would like to reschedule missed appointment
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
## 2023-09-06 NOTE — ED Notes (Signed)
 Patient observed/assessed at bedside. Patient alert and oriented to self and location. Affect is flat. Patient denies pain and anxiety. He denies A/V/H. He denies having any thoughts/plan of self harm and harm towards others. Fluid and snack offered. Patient states that appetite has been good throughout the day.  Verbalizes no further complaints at this time. Will continue to monitor and support.

## 2023-09-07 ENCOUNTER — Other Ambulatory Visit (HOSPITAL_COMMUNITY)
Admission: EM | Admit: 2023-09-07 | Discharge: 2023-09-12 | Disposition: A | Discharge status: Home / Self Care | Attending: Psychiatry | Admitting: Psychiatry

## 2023-09-07 ENCOUNTER — Inpatient Hospital Stay (HOSPITAL_COMMUNITY): Admission: AD | Admit: 2023-09-07 | Source: Intra-hospital

## 2023-09-07 DIAGNOSIS — F1010 Alcohol abuse, uncomplicated: Secondary | ICD-10-CM

## 2023-09-07 DIAGNOSIS — F6381 Intermittent explosive disorder: Secondary | ICD-10-CM

## 2023-09-07 DIAGNOSIS — F339 Major depressive disorder, recurrent, unspecified: Secondary | ICD-10-CM | POA: Diagnosis present

## 2023-09-07 DIAGNOSIS — F1220 Cannabis dependence, uncomplicated: Secondary | ICD-10-CM

## 2023-09-07 LAB — RPR: RPR Ser Ql: NONREACTIVE

## 2023-09-07 MED ORDER — NICOTINE 14 MG/24HR TD PT24
14.0000 mg | MEDICATED_PATCH | Freq: Every day | TRANSDERMAL | Status: DC
  Administered 2023-09-07: 14 mg via TRANSDERMAL
  Filled 2023-09-07: qty 1

## 2023-09-07 MED ORDER — MAGNESIUM HYDROXIDE 400 MG/5ML PO SUSP: 30.0000 mL | Freq: Every day | ORAL | Status: DC | PRN

## 2023-09-07 MED ORDER — OLANZAPINE 10 MG IM SOLR: 10.0000 mg | Freq: Three times a day (TID) | INTRAMUSCULAR | Status: DC | PRN

## 2023-09-07 MED ORDER — OLANZAPINE 10 MG IM SOLR: 5.0000 mg | Freq: Three times a day (TID) | INTRAMUSCULAR | Status: DC | PRN

## 2023-09-07 MED ORDER — OLANZAPINE 5 MG PO TBDP: 5.0000 mg | ORAL_TABLET | Freq: Three times a day (TID) | ORAL | Status: DC | PRN

## 2023-09-07 MED ORDER — ALUM & MAG HYDROXIDE-SIMETH 200-200-20 MG/5ML PO SUSP: 30.0000 mL | ORAL | Status: DC | PRN

## 2023-09-07 MED ORDER — BUPROPION HCL ER (XL) 150 MG PO TB24
150.0000 mg | ORAL_TABLET | Freq: Every day | ORAL | Status: DC
  Administered 2023-09-08 – 2023-09-12 (×5): 150 mg via ORAL
  Filled 2023-09-07 (×5): qty 1

## 2023-09-07 MED ORDER — TRIAMCINOLONE ACETONIDE 0.1 % EX CREA
TOPICAL_CREAM | Freq: Two times a day (BID) | CUTANEOUS | Status: DC | PRN
Start: 2023-09-07 — End: 2023-09-08

## 2023-09-07 MED ORDER — HYDROXYZINE HCL 25 MG PO TABS: 25.0000 mg | ORAL_TABLET | Freq: Three times a day (TID) | ORAL | Status: DC | PRN

## 2023-09-07 MED ORDER — BUPROPION HCL ER (XL) 150 MG PO TB24
150.0000 mg | ORAL_TABLET | Freq: Every day | ORAL | Status: DC
  Administered 2023-09-07: 150 mg via ORAL
  Filled 2023-09-07: qty 1

## 2023-09-07 MED ORDER — QUETIAPINE FUMARATE ER 50 MG PO TB24
100.0000 mg | ORAL_TABLET | Freq: Every day | ORAL | Status: DC
  Administered 2023-09-07: 100 mg via ORAL
  Filled 2023-09-07: qty 2

## 2023-09-07 MED ORDER — QUETIAPINE FUMARATE 50 MG PO TABS
50.0000 mg | ORAL_TABLET | Freq: Two times a day (BID) | ORAL | Status: DC
  Administered 2023-09-07: 50 mg via ORAL
  Filled 2023-09-07: qty 1

## 2023-09-07 MED ORDER — TRAZODONE HCL 50 MG PO TABS: 50.0000 mg | ORAL_TABLET | Freq: Every evening | ORAL | Status: DC | PRN

## 2023-09-07 MED ORDER — ACETAMINOPHEN 325 MG PO TABS
650.0000 mg | ORAL_TABLET | Freq: Four times a day (QID) | ORAL | Status: DC | PRN
  Administered 2023-09-10 – 2023-09-11 (×2): 650 mg via ORAL
  Filled 2023-09-07 (×2): qty 2

## 2023-09-07 NOTE — ED Notes (Signed)
 Patient observed/assessed in bed/chair resting quietly appearing in no distress and verbalizing no complaints at this time. Will continue to monitor.

## 2023-09-07 NOTE — ED Notes (Signed)
 Pt is in his room resting in bed. Pt denies SI/HI/AVH. No acute distress noted. Will continue to monitor for safety.

## 2023-09-07 NOTE — Discharge Instructions (Signed)
Accepted to [facility]

## 2023-09-07 NOTE — ED Notes (Signed)
 Patient is sleeping. Respirations equal and unlabored, skin warm and dry. No change in assessment or acuity. Routine safety checks conducted according to facility protocol. Will continue to monitor for safety.
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
## 2023-09-07 NOTE — ED Provider Notes (Signed)
 FBC/OBS ASAP Discharge Summary  Date and Time: 09/07/2023 12:30 PM  Name: Timothy [name]  MRN:  [id number]   Discharge Diagnoses:  Final diagnoses:  Hallucinations  MDD (major depressive disorder), single episode, severe with psychotic features [facility])    Subjective: "I have been having suicidal thoughts for 3 days"  Stay Summary: Patient admitted to [facility] on 09/06/2023 due to auditory and visual hallucinations.  Patient today reports that he has been having worsening suicidal thoughts over the last 3 days and had been hearing voices telling him to harm himself to days ago to harm other people.  He denies any active homicidal thoughts today.  He continues to have suicidal thoughts and reports worsening depression.  Patient was admitted to [facility] June 2023 with a diagnosis of schizoaffective disorder depression type prescribed Seroquel, Depakote and Wellbutrin, however he took medications for a while and never followed up outpatient to continue psychiatric medications.  Patient is currently homeless he was brought in yesterday by counselor at the [facility] who was concerned regarding his mental health.  Patient is here voluntarily and is open to treatment.  Patient was restarted on Wellbutrin and Quetiapine (lower doses) with a plan to titrate medications to effect. Patient continues to need inpatient psychiatric treatment criteria and was accepted to the facility based crisis for crisis stabilization and medication management.   Total Time spent with patient: 30 minutes  Past Psychiatric History: Schizoaffective disorder, depression Past Medical History: Dermatitis, atopic Family History: No family medical or psychiatric history reported Family Psychiatric History: No family medical or psychiatric history reported Social History: Patient is currently homeless Tobacco Cessation:  N/A, patient does not currently use tobacco products  Current Medications:  Current Facility-Administered Medications   Medication Dose Route Frequency Provider Last Rate Last Admin   acetaminophen (TYLENOL) tablet 650 mg  650 mg Oral Q6H PRN Marlou Sa, NP       alum & mag hydroxide-simeth (MAALOX/MYLANTA) 200-200-20 MG/5ML suspension 30 mL  30 mL Oral Q4H PRN Rayburn Go, Veronique M, NP       buPROPion (WELLBUTRIN XL) 24 hr tablet 150 mg  150 mg Oral Daily Bing Neighbors, NP   150 mg at 09/07/23 1204   hydrOXYzine (ATARAX) tablet 25 mg  25 mg Oral TID PRN Marlou Sa, NP   25 mg at 09/07/23 0957   LORazepam (ATIVAN) injection 2 mg  2 mg Intramuscular Q12H PRN Marlou Sa, NP       magnesium hydroxide (MILK OF MAGNESIA) suspension 30 mL  30 mL Oral Daily PRN Rayburn Go, Veronique M, NP       nicotine (NICODERM CQ - dosed in mg/24 hours) patch 14 mg  14 mg Transdermal Daily Bing Neighbors, NP   14 mg at 09/07/23 0955   QUEtiapine (SEROQUEL) tablet 50 mg  50 mg Oral BID Bing Neighbors, NP   50 mg at 09/07/23 1204   traZODone (DESYREL) tablet 50 mg  50 mg Oral QHS PRN Marlou Sa, NP       ziprasidone (GEODON) injection 20 mg  20 mg Intramuscular Q12H PRN Marlou Sa, NP       Current Outpatient Medications  Medication Sig Dispense Refill   buPROPion (WELLBUTRIN XL) 300 MG 24 hr tablet Take 1 tablet (300 mg total) by mouth daily for 10 days. 10 tablet 0   diphenhydrAMINE (BENADRYL) 25 MG tablet Take 1 tablet (25 mg total) by mouth every 6 (six) hours as needed for  up to 7 days. 28 tablet 0   divalproex (DEPAKOTE) 500 MG DR tablet Take 1 tablet (500 mg total) by mouth every 12 (twelve) hours for 20 days. 10 tablet 0   nystatin (MYCOSTATIN/NYSTOP) powder Apply 1 Application topically 3 (three) times daily. 15 g 0   QUEtiapine (SEROQUEL) 400 MG tablet Take 1 tablet (400 mg total) by mouth at bedtime for 10 days. 10 tablet 0   triamcinolone cream (KENALOG) 0.1 % Apply 1 Application topically 2 (two) times daily. 30 g 0    PTA Medications:  Facility Ordered  Medications  Medication   acetaminophen (TYLENOL) tablet 650 mg   alum & mag hydroxide-simeth (MAALOX/MYLANTA) 200-200-20 MG/5ML suspension 30 mL   magnesium hydroxide (MILK OF MAGNESIA) suspension 30 mL   traZODone (DESYREL) tablet 50 mg   hydrOXYzine (ATARAX) tablet 25 mg   ziprasidone (GEODON) injection 20 mg   LORazepam (ATIVAN) injection 2 mg   nicotine (NICODERM CQ - dosed in mg/24 hours) patch 14 mg   buPROPion (WELLBUTRIN XL) 24 hr tablet 150 mg   QUEtiapine (SEROQUEL) tablet 50 mg   PTA Medications  Medication Sig   nystatin (MYCOSTATIN/NYSTOP) powder Apply 1 Application topically 3 (three) times daily.   triamcinolone cream (KENALOG) 0.1 % Apply 1 Application topically 2 (two) times daily.   diphenhydrAMINE (BENADRYL) 25 MG tablet Take 1 tablet (25 mg total) by mouth every 6 (six) hours as needed for up to 7 days.       09/06/2023    4:28 PM 07/05/2023    3:22 PM 12/31/2019   10:13 AM  Depression screen PHQ 2/9  Decreased Interest 1 3 2   Down, Depressed, Hopeless 1 2 1   PHQ - 2 Score 2 5 3   Altered sleeping 1 3 3   Tired, decreased energy 1 3 3   Change in appetite 0 2   Feeling bad or failure about yourself  1 3   Trouble concentrating 1 3 1   Moving slowly or fidgety/restless 0 2 2  Suicidal thoughts 1 3 2   PHQ-9 Score 7 24 14   Difficult doing work/chores Somewhat difficult Very difficult Very difficult    Flowsheet Row ED from 09/06/2023 in [facility] ED from 09/03/2023 in [facility] Emergency Department at [facility] ED from 06/27/2023 in [facility] Emergency Department at [facility]  C-SSRS RISK CATEGORY Error: Q7 should not be populated when Q6 is No No Risk No Risk       Musculoskeletal  Strength & Muscle Tone: within normal limits Gait & Station: normal Patient leans: N/A  Psychiatric Specialty Exam  Presentation  General Appearance:  Casual  Eye Contact: Fair  Speech: Clear and Coherent; Normal  Rate  Speech Volume: Normal  Handedness: Right   Mood and Affect  Mood: Anxious  Affect: Congruent   Thought Process  Thought Processes: Coherent  Descriptions of Associations:Intact  Orientation:Full (Time, Place and Person)  Thought Content:Logical  Diagnosis of Schizophrenia or Schizoaffective disorder in past: No data recorded Duration of Psychotic Symptoms: Less than six months   Hallucinations:Hallucinations: Auditory; Visual Description of Auditory Hallucinations: "I hear and see things that are not there"  Ideas of Reference:None  Suicidal Thoughts:Suicidal Thoughts: No  Homicidal Thoughts:Homicidal Thoughts: No   Sensorium  Memory: Immediate Fair; Recent Fair; Remote Poor  Judgment: Fair  Insight: Fair   Chartered certified accountant: Fair  Attention Span: Fair  Recall: [employer] of Knowledge: Fair  Language: Fair   Psychomotor  Activity  Psychomotor Activity: Psychomotor Activity: Normal   Assets  Assets: Communication Skills; Desire for Improvement; Physical Health   Sleep  Sleep: Sleep: Fair Number of Hours of Sleep: 7   Nutritional Assessment (For OBS and FBC admissions only) Has the patient had a weight loss or gain of 10 pounds or more in the last 3 months?: No Has the patient had a decrease in food intake/or appetite?: No Does the patient have dental problems?: No Does the patient have eating habits or behaviors that may be indicators of an eating disorder including binging or inducing vomiting?: No Has the patient recently lost weight without trying?: 0 Has the patient been eating poorly because of a decreased appetite?: 0 Malnutrition Screening Tool Score: 0    Physical Exam  Physical Exam Vitals reviewed.  Constitutional:      Appearance: Normal appearance.  HENT:     Head: Normocephalic and atraumatic.  Eyes:     Extraocular Movements: Extraocular movements intact.     Pupils: Pupils are  equal, round, and reactive to light.  Cardiovascular:     Rate and Rhythm: Normal rate and regular rhythm.  Pulmonary:     Effort: Pulmonary effort is normal.     Breath sounds: Normal breath sounds.  Musculoskeletal:     Cervical back: Normal range of motion and neck supple.  Skin:    Capillary Refill: Capillary refill takes less than 2 seconds.     Findings: Rash present.  Neurological:     General: No focal deficit present.     Mental Status: He is alert.     Review of Systems  Psychiatric/Behavioral:  Positive for hallucinations and suicidal ideas. The patient has insomnia.     Blood pressure (!) 111/57, pulse 61, temperature 98.2 F (36.8 C), temperature source Oral, resp. rate 18, SpO2 98%. There is no height or weight on file to calculate BMI.  Demographic Factors:  Male and Low socioeconomic status  Loss Factors: Financial problems/change in socioeconomic status  Historical Factors: Impulsivity  Risk Reduction Factors:   NA  Continued Clinical Symptoms:  More than one psychiatric diagnosis  Cognitive Features That Contribute To Risk:  None    Suicide Risk:  Severe:  Frequent, intense, and enduring suicidal ideation, specific plan, no subjective intent, but some objective markers of intent (i.e., choice of lethal method), the method is accessible, some limited preparatory behavior, evidence of impaired self-control, severe dysphoria/symptomatology, multiple risk factors present, and few if any protective factors, particularly a lack of social support.  Plan Of Care/Follow-up recommendations:  Other:  -Restarted Wellbutrin at 150 mg XR given the length of time patient has been off medicines, titrate to max dose of 300 mg XR. Seroquel   Disposition: Transfer to Facility Based Crisis  Joaquin Courts, NP 09/07/2023, 12:30 PM

## 2023-09-07 NOTE — ED Notes (Signed)
 Pt cont to be awake and alert.  He has been redirectable and takes medication without prompting.   Pt's skin was assessed again and he cont to have mult small white bumps on Chest and back.  He denies itching and no tunneling or redness noted.

## 2023-09-08 DIAGNOSIS — F1010 Alcohol abuse, uncomplicated: Secondary | ICD-10-CM

## 2023-09-08 DIAGNOSIS — F339 Major depressive disorder, recurrent, unspecified: Secondary | ICD-10-CM

## 2023-09-08 DIAGNOSIS — F1220 Cannabis dependence, uncomplicated: Secondary | ICD-10-CM | POA: Diagnosis not present

## 2023-09-08 MED ORDER — TRIAMCINOLONE ACETONIDE 0.1 % EX CREA
TOPICAL_CREAM | Freq: Three times a day (TID) | CUTANEOUS | Status: DC
  Filled 2023-09-08 (×2): qty 15

## 2023-09-08 MED ORDER — QUETIAPINE FUMARATE ER 200 MG PO TB24
200.0000 mg | ORAL_TABLET | Freq: Every day | ORAL | Status: DC
  Administered 2023-09-08: 200 mg via ORAL
  Filled 2023-09-08: qty 1

## 2023-09-08 NOTE — ED Notes (Signed)
 Patient is awake and alert on unit without distress or complaint.  Patient remains dysphoric with constricted affect.  He makes needs known to staff and denies avh shi or plan at this time.  Will monitor.

## 2023-09-08 NOTE — Group Note (Signed)
 Group Topic: Wellness  Group Date: 09/08/2023 Start Time: 1145 End Time: 1230 Facilitators: Londell Moh, NT  Department: [facility]  Number of Participants: 10  Group Focus: Nutrition Treatment Modality:  Psychoeducation Interventions utilized were patient education Purpose: increase insight  Name: Timothy [name] Date of Birth: 11/06/1995  MR: [id number]    Level of Participation: pt did not attend group.  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

## 2023-09-08 NOTE — ED Notes (Signed)
 Pt was provided breakfast.

## 2023-09-08 NOTE — Group Note (Signed)
 Group Topic: Wellness  Group Date: 09/08/2023 Start Time: 1000 End Time: 1030 Facilitators: Londell Moh, NT  Department: [facility]  Number of Participants: 10  Group Focus: check in, concentration, and daily focus Treatment Modality:  Psychoeducation Interventions utilized were patient education Purpose: enhance coping skills, express feelings, and increase insight  Name: Timothy [name] Date of Birth: May 17, 1996  MR: [id number]    Level of Participation: moderate Quality of Participation: attentive Interactions with others: gave feedback Mood/Affect: appropriate Triggers (if applicable): n/a Cognition: coherent/clear Progress: Moderate Response: Pt was able to share goals for the day and express understanding towards rules and expectations of the fbc unit. Plan: patient will be encouraged to attend future groups.  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

## 2023-09-08 NOTE — ED Notes (Signed)
 Pt was provided lunch

## 2023-09-08 NOTE — ED Provider Notes (Signed)
 Facility Based Crisis Admission H&P  Date: 09/08/23 Patient Name: Timothy [name] MRN: [id number] Chief Complaint: "I just need to leave [place]"  Diagnoses:  Final diagnoses:  Cannabis use disorder, severe, dependence (HCC)  Alcohol abuse  MDD, moderate, recurrent, rule out substance induced mood disorder Unspecified psychosis, rule out substance induced psychosis  High suspicion for malingering component (based on shifting and vague history, perseveration on housing and financial resources and lack of interest in rehab resources and medication management)  HPI: ' 28 yo M with PPH of reported schizoaffective disorder depressed type. Patient admitted to [facility] on 09/06/2023 due to auditory and visual hallucinations.   On 3/5 as per NP documentation:   Patient today reports that he has been having worsening suicidal thoughts over the last 3 days and had been hearing voices telling him to harm himself to days ago to harm other people.  He denies any active homicidal thoughts today.  He continues to have suicidal thoughts and reports worsening depression.  Patient was admitted to [facility] June 2023 with a diagnosis of schizoaffective disorder depression type prescribed Seroquel, Depakote and Wellbutrin, however he took medications for a while and never followed up outpatient to continue psychiatric medications.  Patient is currently homeless he was brought in yesterday by counselor at the [facility] who was concerned regarding his mental health. "    I evaluated patient on [facility] with case manager and patient reports he has been homeless for 2-3 weeks and unemployed since February 8th. State he may have an assault charge but not sure if it was actually filed or not. Patient states he has been abusing cannabis daily and drinking 2-3 half pints of liquor 2-3 times a week. Patient reports depressed mood for last several months but denies SI, HI or wanting to be dead today. Denies hearing any voices telling him to hurt  anyone and states he never said that (which is inconsistent with documentation yesterday). Tolerating Wellbutrin and Seroquel well and later in the evening patient was agreeable to further increase of Seroquel as states he did not sleep well yesterday and feels "anxious." Patient denies AH and does not appear to be responding to internal stimuli. Reports vague visual hallucinations of "spots."  Overall throughout evaluation patient is a vague historian with often vague timeline and shifting stories of why he presented to the hospital and what he wants help with. When I offer patient suggestions of what we can help with like rehab services, mental healthcare as well as psychotherapy, patient is dismissive and tells me there is nothing we can do for him and that he just wants to leave [place] and go to another states where they can help him with housing and to get on disability. Patient is dismissive of any medication changes and rehab services when multiple options are discussed with his case manager and myself present in room. Patient is compliant with Wellbutrin and Seroquel that was started and reports these medications may have been helpful to his mood in the past however states he has not been taking any medications and is not following up with a psychiatrist. Additionally is not interested in outpatient appointments.   PHQ 2-9:  Flowsheet Row ED from 09/07/2023 in [facility] ED from 09/06/2023 in [facility] Office Visit from 07/04/2023 in [facility] CLINIC 1  Thoughts that you would be better off dead, or of hurting yourself in some way Several days Several days Nearly every day  PHQ-9 Total Score 8 7 24        Flowsheet Row ED from 09/07/2023 in [facility] ED from 09/06/2023 in [facility] ED from 09/03/2023 in [facility] Emergency Department at [facility]  C-SSRS RISK CATEGORY Low  Risk Error: Q7 should not be populated when Q6 is No No Risk         Total Time spent with patient: 20 minutes  Musculoskeletal  Strength & Muscle Tone: within normal limits Gait & Station: normal Patient leans: N/A  Psychiatric Specialty Exam  Presentation General Appearance:  Appropriate for Environment  Eye Contact: Fair  Speech: Normal Rate  Speech Volume: Decreased  Handedness: Right   Mood and Affect  Mood: Anxious; Irritable  Affect: Congruent   Thought Process  Thought Processes: Coherent  Descriptions of Associations:Intact  Orientation:Full (Time, Place and Person)  Thought Content:Logical  Diagnosis of Schizophrenia or Schizoaffective disorder in past: No data recorded Duration of Psychotic Symptoms: Less than six months  Hallucinations:Hallucinations: Visual Description of Auditory Hallucinations: denies Description of Visual Hallucinations: "spots"  Ideas of Reference:None  Suicidal Thoughts:Suicidal Thoughts: No  Homicidal Thoughts:Homicidal Thoughts: No   Sensorium  Memory: Immediate Fair  Judgment: Poor  Insight: Fair   Chartered certified accountant: Fair  Attention Span: Fair  Recall: Fair  Fund of Knowledge: Fair  Language: Fair   Psychomotor Activity  Psychomotor Activity:Psychomotor Activity: Normal   Assets  Assets: Manufacturing systems engineer; Desire for Improvement; Physical Health   Sleep  Sleep:Sleep: Fair Number of Hours of Sleep: 5   No data recorded  Physical exam: Please see exam on admit note. General: Well developed, well nourished.  Pupils: Normal at 3mm Respiratory: Breathing is unlabored.  Cardiovascular: No edema.  Language: No anomia, no aphasia Muscle strength and tone-pt moving all extremities.  Gait not assessed as pt remained in bed.  Neuro: Facial muscles are symmetric. Pt without tremor, no evidence of hyperarousal.  Review of Systems  Constitutional: Negative.    HENT: Negative.    Eyes: Negative.   Respiratory: Negative.    Cardiovascular: Negative.   Gastrointestinal: Negative.   Genitourinary: Negative.   Musculoskeletal: Negative.   Skin: Negative.   Endo/Heme/Allergies: Negative.     Blood pressure 134/78, pulse (!) 55, temperature 98 F (36.7 C), temperature source Oral, resp. rate 16, SpO2 100%. There is no height or weight on file to calculate BMI.  Past Psychiatric History:   Past Psychiatric History: Schizoaffective disorder, depression  Reports 1 prior admission: . He was last hospitalized at [facility] in June 2023 under Dr Toni Amend and was discharged on the following medications:   "buPROPion 300 MG 24 hr table PO Daily divalproex 500 MG DR tablet PO  Q 12 hrs hydrOXYzine 50 MG tablet  PO TID PRN QUEtiapine 400 MG tablet  PO HS"  Suicide attempts: pateint denies   Past Medical History: Dermatitis, atopic  Is the patient at risk to self? Yes  Has the patient been a risk to self in the past 6 months? Yes .    Has the patient been a risk to self within the distant past? Yes   Is the patient a risk to others? No   Has the patient been a risk to others in the past 6 months? Yes   Has the patient been a risk to others within the distant past? No   Past Medical History:  Past Medical History:  Diagnosis Date   Cannabis abuse  Paranoid schizophrenia (HCC)    Schizoaffective disorder, depressive type (HCC) 09/17/2014     Family History:  Family History  Problem Relation Age of Onset   Mental illness Brother    Drug abuse Brother    Mental illness Cousin    Suicidality Cousin    Diabetes Maternal Grandmother      Social History: Patient is homeless and unemployed. Patient is from [place] and states he has family in the area but is not on close terms with them.  Last Labs:  Admission on 09/06/2023, Discharged on 09/07/2023  Component Date Value Ref Range Status   WBC 09/06/2023 5.4  4.0 - 10.5 K/uL Final   RBC  09/06/2023 5.08  4.22 - 5.81 MIL/uL Final   Hemoglobin 09/06/2023 15.1  13.0 - 17.0 g/dL Final   HCT 86/57/8469 46.3  39.0 - 52.0 % Final   MCV 09/06/2023 91.1  80.0 - 100.0 fL Final   MCH 09/06/2023 29.7  26.0 - 34.0 pg Final   MCHC 09/06/2023 32.6  30.0 - 36.0 g/dL Final   RDW 62/95/2841 12.9  11.5 - 15.5 % Final   Platelets 09/06/2023 216  150 - 400 K/uL Final   nRBC 09/06/2023 0.0  0.0 - 0.2 % Final   Neutrophils Relative % 09/06/2023 44  % Final   Neutro Abs 09/06/2023 2.4  1.7 - 7.7 K/uL Final   Lymphocytes Relative 09/06/2023 42  % Final   Lymphs Abs 09/06/2023 2.3  0.7 - 4.0 K/uL Final   Monocytes Relative 09/06/2023 11  % Final   Monocytes Absolute 09/06/2023 0.6  0.1 - 1.0 K/uL Final   Eosinophils Relative 09/06/2023 2  % Final   Eosinophils Absolute 09/06/2023 0.1  0.0 - 0.5 K/uL Final   Basophils Relative 09/06/2023 1  % Final   Basophils Absolute 09/06/2023 0.0  0.0 - 0.1 K/uL Final   Immature Granulocytes 09/06/2023 0  % Final   Abs Immature Granulocytes 09/06/2023 0.01  0.00 - 0.07 K/uL Final   Performed at [facility], [address] [place], [place] [address]   Sodium 09/06/2023 138  135 - 145 mmol/L Final   Potassium 09/06/2023 3.9  3.5 - 5.1 mmol/L Final   Chloride 09/06/2023 103  98 - 111 mmol/L Final   CO2 09/06/2023 26  22 - 32 mmol/L Final   Glucose, Bld 09/06/2023 97  70 - 99 mg/dL Final   Glucose reference range applies only to samples taken after fasting for at least 8 hours.   BUN 09/06/2023 10  6 - 20 mg/dL Final   Creatinine, Ser 09/06/2023 1.10  0.61 - 1.24 mg/dL Final   Calcium 05/01/2535 9.3  8.9 - 10.3 mg/dL Final   Total Protein 64/40/3474 7.1  6.5 - 8.1 g/dL Final   Albumin 25/95/6387 4.1  3.5 - 5.0 g/dL Final   AST 56/43/3295 18  15 - 41 U/L Final   ALT 09/06/2023 15  0 - 44 U/L Final   Alkaline Phosphatase 09/06/2023 39  38 - 126 U/L Final   Total Bilirubin 09/06/2023 0.9  0.0 - 1.2 mg/dL Final   GFR, Estimated 09/06/2023 >60  >60  mL/min Final   Comment: (NOTE) Calculated using the CKD-EPI Creatinine Equation (2021)    Anion gap 09/06/2023 9  5 - 15 Final   Performed at [facility], [address] [place], [place] [address]   Hgb A1c MFr Bld 09/06/2023 5.2  4.8 - 5.6 % Final   Comment: (  NOTE) Pre diabetes:          5.7%-6.4%  Diabetes:              >6.4%  Glycemic control for   <7.0% adults with diabetes    Mean Plasma Glucose 09/06/2023 102.54  mg/dL Final   Performed at [facility], [address] [place], [place] [address]   Magnesium 09/06/2023 2.4  1.7 - 2.4 mg/dL Final   Performed at [facility], [address], [place], [place] [address]   Alcohol, Ethyl (B) 09/06/2023 <10  <10 mg/dL Final   Comment: (NOTE) Lowest detectable limit for serum alcohol is 10 mg/dL.  For medical purposes only. Performed at [facility], [address] [place], [place] [address]    Cholesterol 09/06/2023 191  0 - 200 mg/dL Final   Triglycerides 91/47/8295 129  <150 mg/dL Final   HDL 62/13/0865 53  >40 mg/dL Final   Total CHOL/HDL Ratio 09/06/2023 3.6  RATIO Final   VLDL 09/06/2023 26  0 - 40 mg/dL Final   LDL Cholesterol 09/06/2023 112 (H)  0 - 99 mg/dL Final   Comment:        Total Cholesterol/HDL:CHD Risk Coronary Heart Disease Risk Table                     Men   Women  1/2 Average Risk   3.4   3.3  Average Risk       5.0   4.4  2 X Average Risk   9.6   7.1  3 X Average Risk  23.4   11.0        Use the calculated Patient Ratio above and the CHD Risk Table to determine the patient's CHD Risk.        ATP III CLASSIFICATION (LDL):  <100     mg/dL   Optimal  784-696  mg/dL   Near or Above                    Optimal  130-159  mg/dL   Borderline  295-284  mg/dL   High  >132     mg/dL   Very High Performed at [facility], [address], [place], [place] [address]    TSH 09/06/2023 0.789  0.350 - 4.500 uIU/mL Final   Comment: Performed by a 3rd Generation assay with a  functional sensitivity of <=0.01 uIU/mL. Performed at [facility], [address], [place], [place] [address]    RPR Ser Ql 09/06/2023 NON REACTIVE  NON REACTIVE Final   Performed at [facility], [address], [place], [place] [address]   Color, Urine 09/06/2023 YELLOW  YELLOW Final   APPearance 09/06/2023 CLEAR  CLEAR Final   Specific Gravity, Urine 09/06/2023 1.031 (H)  1.005 - 1.030 Final   pH 09/06/2023 5.0  5.0 - 8.0 Final   Glucose, UA 09/06/2023 NEGATIVE  NEGATIVE mg/dL Final   Hgb urine dipstick 09/06/2023 NEGATIVE  NEGATIVE Final   Bilirubin Urine 09/06/2023 NEGATIVE  NEGATIVE Final   Ketones, ur 09/06/2023 NEGATIVE  NEGATIVE mg/dL Final   Protein, ur 34/74/2595 NEGATIVE  NEGATIVE mg/dL Final   Nitrite 63/87/5643 NEGATIVE  NEGATIVE Final   Leukocytes,Ua 09/06/2023 NEGATIVE  NEGATIVE Final   Performed at [facility], [address] [place], [place] [address]   POC Amphetamine UR 09/06/2023 None Detected  NONE DETECTED (Cut Off Level 1000  ng/mL) Final   POC Secobarbital (BAR) 09/06/2023 None Detected  NONE DETECTED (Cut Off Level 300 ng/mL) Final   POC Buprenorphine (BUP) 09/06/2023 None Detected  NONE DETECTED (Cut Off Level 10 ng/mL) Final   POC Oxazepam (BZO) 09/06/2023 None Detected  NONE DETECTED (Cut Off Level 300 ng/mL) Final   POC Cocaine UR 09/06/2023 None Detected  NONE DETECTED (Cut Off Level 300 ng/mL) Final   POC Methamphetamine UR 09/06/2023 None Detected  NONE DETECTED (Cut Off Level 1000 ng/mL) Final   POC Morphine 09/06/2023 None Detected  NONE DETECTED (Cut Off Level 300 ng/mL) Final   POC Methadone UR 09/06/2023 None Detected  NONE DETECTED (Cut Off Level 300 ng/mL) Final   POC Oxycodone UR 09/06/2023 None Detected  NONE DETECTED (Cut Off Level 100 ng/mL) Final   POC Marijuana UR 09/06/2023 Positive (A)  NONE DETECTED (Cut Off Level 50 ng/mL) Final  Admission on 06/27/2023, Discharged on 06/27/2023  Component Date Value Ref Range Status    Color, Urine 06/27/2023 YELLOW  YELLOW Final   APPearance 06/27/2023 CLEAR  CLEAR Final   Specific Gravity, Urine 06/27/2023 1.031 (H)  1.005 - 1.030 Final   pH 06/27/2023 5.0  5.0 - 8.0 Final   Glucose, UA 06/27/2023 NEGATIVE  NEGATIVE mg/dL Final   Hgb urine dipstick 06/27/2023 NEGATIVE  NEGATIVE Final   Bilirubin Urine 06/27/2023 NEGATIVE  NEGATIVE Final   Ketones, ur 06/27/2023 5 (A)  NEGATIVE mg/dL Final   Protein, ur 16/04/9603 30 (A)  NEGATIVE mg/dL Final   Nitrite 54/03/8118 NEGATIVE  NEGATIVE Final   Leukocytes,Ua 06/27/2023 NEGATIVE  NEGATIVE Final   RBC / HPF 06/27/2023 0-5  0 - 5 RBC/hpf Final   WBC, UA 06/27/2023 0-5  0 - 5 WBC/hpf Final   Bacteria, UA 06/27/2023 NONE SEEN  NONE SEEN Final   Squamous Epithelial / HPF 06/27/2023 0-5  0 - 5 /HPF Final   Mucus 06/27/2023 PRESENT   Final   Hyaline Casts, UA 06/27/2023 PRESENT   Final   Performed at [facility], [address], [place], [place] [address]   Glucose-Capillary 06/27/2023 135 (H)  70 - 99 mg/dL Final   Glucose reference range applies only to samples taken after fasting for at least 8 hours.   Alcohol, Ethyl (B) 06/27/2023 <10  <10 mg/dL Final   Comment: (NOTE) Lowest detectable limit for serum alcohol is 10 mg/dL.  For medical purposes only. Performed at [facility], [address] [place], [place] [address]    Opiates 06/27/2023 NONE DETECTED  NONE DETECTED Final   Cocaine 06/27/2023 NONE DETECTED  NONE DETECTED Final   Benzodiazepines 06/27/2023 NONE DETECTED  NONE DETECTED Final   Amphetamines 06/27/2023 NONE DETECTED  NONE DETECTED Final   Tetrahydrocannabinol 06/27/2023 POSITIVE (A)  NONE DETECTED Final   Barbiturates 06/27/2023 NONE DETECTED  NONE DETECTED Final   Comment: (NOTE) DRUG SCREEN FOR MEDICAL PURPOSES ONLY.  IF CONFIRMATION IS NEEDED FOR ANY PURPOSE, NOTIFY LAB WITHIN 5 DAYS.  LOWEST DETECTABLE LIMITS FOR URINE DRUG SCREEN Drug Class                      Cutoff (ng/mL) Amphetamine and metabolites    1000 Barbiturate and metabolites    200 Benzodiazepine                 200 Opiates and metabolites        300 Cocaine and metabolites  300 THC                            50 Performed at [facility], [address] [place], [place] [address]    Sodium 06/27/2023 137  135 - 145 mmol/L Final   Potassium 06/27/2023 3.6  3.5 - 5.1 mmol/L Final   Chloride 06/27/2023 104  98 - 111 mmol/L Final   CO2 06/27/2023 22  22 - 32 mmol/L Final   Glucose, Bld 06/27/2023 86  70 - 99 mg/dL Final   Glucose reference range applies only to samples taken after fasting for at least 8 hours.   BUN 06/27/2023 18  6 - 20 mg/dL Final   Creatinine, Ser 06/27/2023 1.13  0.61 - 1.24 mg/dL Final   Calcium 19/14/7829 9.3  8.9 - 10.3 mg/dL Final   Total Protein 56/21/3086 7.7  6.5 - 8.1 g/dL Final   Albumin 57/84/6962 4.3  3.5 - 5.0 g/dL Final   AST 95/28/4132 27  15 - 41 U/L Final   ALT 06/27/2023 21  0 - 44 U/L Final   Alkaline Phosphatase 06/27/2023 37 (L)  38 - 126 U/L Final   Total Bilirubin 06/27/2023 1.2 (H)  <1.2 mg/dL Final   GFR, Estimated 06/27/2023 >60  >60 mL/min Final   Comment: (NOTE) Calculated using the CKD-EPI Creatinine Equation (2021)    Anion gap 06/27/2023 11  5 - 15 Final   Performed at [facility], [address] [place], [place] [address]   WBC 06/27/2023 6.6  4.0 - 10.5 K/uL Final   RBC 06/27/2023 4.93  4.22 - 5.81 MIL/uL Final   Hemoglobin 06/27/2023 15.3  13.0 - 17.0 g/dL Final   HCT 27/25/3664 45.2  39.0 - 52.0 % Final   MCV 06/27/2023 91.7  80.0 - 100.0 fL Final   MCH 06/27/2023 31.0  26.0 - 34.0 pg Final   MCHC 06/27/2023 33.8  30.0 - 36.0 g/dL Final   RDW 40/34/7425 12.7  11.5 - 15.5 % Final   Platelets 06/27/2023 219  150 - 400 K/uL Final   nRBC 06/27/2023 0.0  0.0 - 0.2 % Final   Neutrophils Relative % 06/27/2023 60  % Final   Neutro Abs 06/27/2023 4.0  1.7 - 7.7 K/uL Final    Lymphocytes Relative 06/27/2023 28  % Final   Lymphs Abs 06/27/2023 1.8  0.7 - 4.0 K/uL Final   Monocytes Relative 06/27/2023 11  % Final   Monocytes Absolute 06/27/2023 0.7  0.1 - 1.0 K/uL Final   Eosinophils Relative 06/27/2023 1  % Final   Eosinophils Absolute 06/27/2023 0.0  0.0 - 0.5 K/uL Final   Basophils Relative 06/27/2023 0  % Final   Basophils Absolute 06/27/2023 0.0  0.0 - 0.1 K/uL Final   Immature Granulocytes 06/27/2023 0  % Final   Abs Immature Granulocytes 06/27/2023 0.01  0.00 - 0.07 K/uL Final   Performed at [facility], [address], [place], [place] [address]    Allergies: Haldol [haloperidol lactate], Penicillins, and Zyprexa [olanzapine]  Medications:  Facility Ordered Medications  Medication   acetaminophen (TYLENOL) tablet 650 mg   alum & mag hydroxide-simeth (MAALOX/MYLANTA) 200-200-20 MG/5ML suspension 30 mL   magnesium hydroxide (MILK OF MAGNESIA) suspension 30 mL   OLANZapine zydis (ZYPREXA) disintegrating tablet 5 mg   OLANZapine (ZYPREXA) injection 5 mg   OLANZapine (ZYPREXA) injection 10 mg   hydrOXYzine (ATARAX) tablet 25 mg  traZODone (DESYREL) tablet 50 mg   buPROPion (WELLBUTRIN XL) 24 hr tablet 150 mg   triamcinolone cream (KENALOG) 0.1 % cream   QUEtiapine (SEROQUEL XR) 24 hr tablet 200 mg   PTA Medications  Medication Sig   nystatin (MYCOSTATIN/NYSTOP) powder Apply 1 Application topically 3 (three) times daily. (Patient not taking: Reported on 09/07/2023)   triamcinolone cream (KENALOG) 0.1 % Apply 1 Application topically 2 (two) times daily.   diphenhydrAMINE (BENADRYL) 25 MG tablet Take 1 tablet (25 mg total) by mouth every 6 (six) hours as needed for up to 7 days. (Patient not taking: Reported on 09/07/2023)    Long Term Goals: Improvement in symptoms so as ready for discharge  Short Term Goals: Patient will verbalize feelings in meetings with treatment team members., Patient will attend at least of 50% of the groups  daily., Pt will complete the PHQ9 on admission, day 3 and discharge., Patient will participate in completing the [place] Suicide Severity Rating Scale, Patient will score a low risk of violence for 24 hours prior to discharge, and Patient will take medications as prescribed daily.  Medical Decision Making  Patient has decision making capacity    Recommendations  Based on my evaluation the patient does not appear to have an emergency medical condition. As noted in HPI, high suspicion for component of malingering. Patient is not responding to internal stimuli, his thoughts are linear and goal directed and he perseverated on housing and additional finances. His history has been vague and shifted between providers on evaluations. Patient is reporting symptoms of a major depressive episode but appeared minimally interested in medication management and outpatient resources. Denies SI or HI today, Denies wanting to be dead. Patient was previously taking higher dose of Seroquel as well as Wellbutrin which has been restarted. Discussed r/b/a of increasing Seroquel dosing given that he is reporting vague VH and insomnia and patient is agreeable. If patient continues to deny SI and request discharge, possible discharge tomorrow. Patient declined rehab resources.   -Continue Wellbutrin 150 mg qdaily for depression -increase Seroquel to 200 mg at bedtime for vague VH as well as insomnia  Miguel Rota, MD 09/08/23  8:27 PM

## 2023-09-08 NOTE — ED Notes (Signed)
 Patient is seen sitting in the milieu area interacting with peers without any distress noted. He is wearing his personal clothing and endorses SI without a plan and reports VH, people that is not there. He reports anxiety 6 and depression 9. Staff will continue to monitor safety and for changes in condition.

## 2023-09-08 NOTE — Group Note (Signed)
 Group Topic: Feelings about Diagnosis  Group Date: 09/08/2023 Start Time: 1120 End Time: 1145 Facilitators: Jenean Lindau, RN  Department: [facility]  Number of Participants: 10  Group Focus: abuse issues, acceptance, affirmation, anger management, anxiety, chemical dependency education, and coping skills Treatment Modality:  Behavior Modification Therapy Interventions utilized were clarification, confrontation, exploration, and group exercise Purpose: enhance coping skills, explore maladaptive thinking, express feelings, express irrational fears, improve communication skills, increase insight, regain self-worth, reinforce self-care, and relapse prevention strategies  Name: Timothy [name] Date of Birth: 1996-05-14  MR: [id number]    Level of Participation: did not attend Quality of Participation:  Interactions with others:  Mood/Affect:  Triggers (if applicable):  Cognition:  Progress:  Response:  Plan:   Patients Problems:  Patient Active Problem List   Diagnosis Date Noted   MDD (major depressive disorder), recurrent episode, with atypical features (HCC) 09/07/2023   Schizoaffective disorder (HCC) 06/26/2021   Homelessness 11/06/2020   Benzodiazepine abuse (HCC) 08/13/2016   Schizoaffective disorder, depressive type (HCC) 08/12/2016   Schizoaffective disorder, bipolar type (HCC) 09/18/2014   Tobacco use disorder 09/18/2014   

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
## 2023-09-08 NOTE — ED Notes (Signed)
 Patient is sleeping. Respirations equal and unlabored, skin warm and dry. No change in assessment or acuity. Routine safety checks conducted according to facility protocol. Will continue to monitor for safety.

## 2023-09-08 NOTE — ED Notes (Signed)
 Pt was provided dinner.

## 2023-09-08 NOTE — Discharge Instructions (Addendum)
 [facility] [address], [place], [address] [phone] phone  New Patient Assessment/Therapy Walk-Ins:  Monday and Wednesday: 8 am until slots are full. Every 1st and 2nd Fridays of the month: 1 pm - 5 pm.  NO ASSESSMENT/THERAPY WALK-INS ON TUESDAYS OR THURSDAYS  New Patient Assessment/Medication Management Walk-Ins:  Monday - Friday:  8 am - 11 am.  For all walk-ins, we ask that you arrive by 7:30 am because patients will be seen in the order of arrival.  Availability is limited; therefore, you may not be seen on the same day that you walk-in.  Our goal is to serve and meet the needs of our community to the best of our [place] ability.  SUBSTANCE USE TREATMENT for Medicaid and State Funded/IPRS  [facility] [address], [place], [address] [phone] phone NOTE: [facility] is no longer offering IOP services.  Serves those who are low-income or have no insurance.  [facility] [address] [place], [place], [address] [phone] phone [phone] fax NOTE: Does have Substance Abuse-Intensive Outpatient Program [facility]) as well as transitional housing if eligible.  [facility] [address] [place], [place], [address] [phone] phone [phone] fax  [facility] [phone] [place], [place], [address] [phone] phone [phone] fax  HALFWAY HOUSES:  [facility] [phone]  [employer].oxfordvacancies.com  12 STEP PROGRAMS:  Alcoholics Anonymous of [place] SoftwareChalet.be  Narcotics Anonymous of [place] [employer]ct.dk  Al-Anon of [place], [place] www.[place]alanon.org/find-meetings.html  Nar-Anon https://nar-anon.org/find-a-meetin  List of Residential placements:   [facility] in [place]: [phone]  [facility]: [phone]  [place], [place]  [phone]: Male and male facility; 30-day program: (uninsured and Medicaid such as Laurena Bering, [place], [place], partners)  [facility]: [phone]; men and women's facility; 28 days; Can have Medicaid tailored plan Tour manager or Partners)  [facility]: [phone] Karoline Caldwell or Larita Fife; 28 day program; must be fully detox; tailored Medicaid or no insurance  [address] in [place], [place]; [phone]; 28 day all males program; no insurance accepted  [facility] in [place]: Gabriel Rung [phone] (no insurance or Medicaid only); 90 days; outpatient services but provide housing in apartments [place]  [facility] Admission: [phone]: Patient must complete phone screening for placement: [place], [place]; 6 month program; uninsured, Medicaid, and [employer].   [facility]: no insurance required; [phone]  [facility]: [phone]; Intake: Molly Maduro; Must fill out application online; Alecia Lemming Delay [phone] x [address] [facility] in [place], [place]: [phone]; Admissions Coordinators Mr. Maurine Minister or Barron Alvine; 90 day program.  [facility]: [place], [place] [phone]; Co-Ed 9 month to a year program; Online application; Men entry fee is $500 (6-53months);  [employer]: [address] [place], [place] [address]; no fee or insurance required; minimum of 2 years; Highly structured; work based; Intake Coordinator is Thayer Ohm [phone]  [facility] in [place], [place]: [phone]; Fax number is [phone]; website: www.[facility].org; Requires 3-6 page autobiography; 2 year program (18 months and then 25month transitional housing); Admission fee is $300; no insurance needed; work Automotive engineer in [place], [place]: [employer] Desk Staff: Danise Edge [phone]: They have a Men's Regenerations Program 6-39months. Free program; There is an initial $300 fee however, they are willing to work  with patients regarding that. Application is online.  [facility]: Admissions [phone] Doran Heater ext [phone]; Any 7-90 day program is out of pocket; 12  month program is free of charge; there is a $275 entry fee; Patient is responsible for own transportation

## 2023-09-09 DIAGNOSIS — F1220 Cannabis dependence, uncomplicated: Secondary | ICD-10-CM | POA: Diagnosis not present

## 2023-09-09 MED ORDER — QUETIAPINE FUMARATE 50 MG PO TABS
50.0000 mg | ORAL_TABLET | Freq: Every day | ORAL | Status: DC
  Administered 2023-09-09 – 2023-09-11 (×3): 50 mg via ORAL
  Filled 2023-09-09 (×3): qty 1

## 2023-09-09 MED ORDER — QUETIAPINE FUMARATE 50 MG PO TABS: 50.0000 mg | ORAL_TABLET | Freq: Four times a day (QID) | ORAL | Status: DC | PRN

## 2023-09-09 MED ORDER — QUETIAPINE FUMARATE 50 MG PO TABS: 50.0000 mg | ORAL_TABLET | Freq: Every day | ORAL | Status: DC

## 2023-09-09 MED ORDER — QUETIAPINE FUMARATE ER 300 MG PO TB24
300.0000 mg | ORAL_TABLET | Freq: Every day | ORAL | Status: DC
Start: 2023-09-09 — End: 2023-09-11
  Administered 2023-09-09 – 2023-09-10 (×2): 300 mg via ORAL
  Filled 2023-09-09 (×2): qty 1

## 2023-09-09 NOTE — ED Notes (Signed)
 Patient is resting in bed with eyes closed without any distress noted. Staff will continue to monitor safety and for changes in condition.

## 2023-09-09 NOTE — Group Note (Signed)
 Group Topic: Recovery Basics  Group Date: 09/09/2023 Start Time: 1000 End Time: 1040 Facilitators: Vicki Mallet, NT  Department: [facility]  Number of Participants: 8  Group Focus: goals/reality orientation Treatment Modality:  Psychoeducation Interventions utilized were patient education Purpose: reinforce self-care  Name: Timothy [name] Date of Birth: [birth year]  MR: [id number]    Level of Participation: minimal Quality of Participation: cooperative Interactions with others: gave feedback Mood/Affect: appropriate Triggers (if applicable): NA Cognition: coherent/clear Progress: Moderate Response: NA Plan: follow-up needed  Patients Problems:  Patient Active Problem List   Diagnosis Date Noted   MDD (major depressive disorder), recurrent episode, with atypical features (HCC) 09/07/2023   Schizoaffective disorder (HCC) 06/26/2021   Homelessness 11/06/2020   Benzodiazepine abuse (HCC) 08/13/2016   Schizoaffective disorder, depressive type (HCC) 08/12/2016   Schizoaffective disorder, bipolar type (HCC) 09/18/2014   Tobacco use disorder 09/18/2014   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

## 2023-09-09 NOTE — Progress Notes (Addendum)
 Pt is awake, alert and oriented with flat and nonchalant affect. Pt appears guarded with minimal responses. Pt complained of foot pain but declined intervention when offered. Administered scheduled meds per order. Pt endorses [facility] and reported "seeing people who are not there." Pt denies current SI/HI/AH, plan or intent. Staff will monitor for pt's safety.

## 2023-09-09 NOTE — Discharge Planning (Signed)
 LCSW went and spoke with patient on this morning to discuss disposition plans and available options. Patient is very nonchalant and appears to not be invested in seeking out further assistance for himself. LCSW provided and explained the current options available to him at this time that he would need to explore if interested. LCSW provided him a list of the [address] in the [place] and [place] area. LCSW also provided a list of the longterm substance abuse facilities and Sober Living agencies that he could call for placement. LCSW explored if patient was adamant about discharging on today and patient stated "if you all are not going to help me then yeah". Brief counseling was provided to the patient regarding his attitude and approach on yesterday. Patient was informed that team is here to assist him, however team will not be able to follow up with the facilities for him as most facilities want to speak directly to the patient. Patient expressed understanding and is aware that LCSW will follow up at a later time on today for updates. No other needs to report at this time.   LCSW will continue to follow and provide support to the patient while on [facility] unit.   Fernande Boyden, LCSW Clinical Social Worker [place] [facility] Ph: [phone]

## 2023-09-09 NOTE — Group Note (Signed)
 Group Topic: Positive Affirmations  Group Date: 09/09/2023 Start Time: 0800 End Time: 2100 Facilitators: Quinn Axe, NT  Department: [facility]  Number of Participants: 8  Group Focus: affirmation Treatment Modality:  Cognitive Behavioral Therapy and Spiritual Interventions utilized were clarification and story telling Purpose: enhance coping skills, express feelings, increase insight, regain self-worth, and reinforce self-care  Name: Timothy [name] Date of Birth: 1996/04/29  MR: [id number]    Level of Participation: not present Quality of Participation: n/a Interactions with others:n/a  Mood/Affect: n/a Triggers (if applicable): n/a Cognition: n/a Progress: None Response: n/a Plan: patient will be encouraged to attend all scheduled activities on the unit.  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

## 2023-09-09 NOTE — ED Provider Notes (Signed)
 Behavioral Health Progress Note  Date and Time: 09/09/2023 2:03 PM Name: Timothy [name] MRN:  [id number]  Subjective:  Patient reports he had difficulty sleeping last night but that Seroquel has been helping with his mood and decreasing irritability. Fair appetite. Denies SI, HI or AH. Patient continues to report vague VH of "spots" and "shadows." Patient is more cooperative on my evaluation today although still minimally motivated with case manager. As noted on initial eval there is possibility of a malingering component. Patient states he may have to go to court for an assault charge but "I'm not sure." States he became upset and has difficulty controlling his mood. States this happened at a hotel prior to admission. Patient denies wanting to be dead. Has remained intermittently dismissive of rehab and outpatient resources however is medication compliant and interested in further increase of Seroquel.   Diagnosis:  Final diagnoses:  Cannabis use disorder, severe, dependence (HCC)  Alcohol abuse  Schizoaffective disorder ,depressed type, rule out substance induced mood disorder versus malingering   High suspicion for malingering component (based on shifting and vague history, perseveration on housing and financial resources and lack of interest in rehab resources and medication management)  Total Time spent with patient: 20 minutes  Past Psychiatric History: Schizoaffective disorder, depression   Reports 1 prior admission: . He was last hospitalized at [facility] in June 2023 under Dr Toni Amend and was discharged on the following medications:    "buPROPion 300 MG 24 hr table PO Daily divalproex 500 MG DR tablet PO  Q 12 hrs hydrOXYzine 50 MG tablet  PO TID PRN QUEtiapine 400 MG tablet  PO HS"   Suicide attempts: pateint denies     Past Medical History: Dermatitis, atopic     Past Medical History:      Past Medical History:  Diagnosis Date   Cannabis abuse     Paranoid schizophrenia (HCC)      Schizoaffective disorder, depressive type (HCC) 09/17/2014          Family History:       Family History  Problem Relation Age of Onset   Mental illness Brother     Drug abuse Brother     Mental illness Cousin     Suicidality Cousin     Diabetes Maternal Grandmother            Social History: Patient is homeless and unemployed. Patient is from [place] and states he has family in the area but is not on close terms with them.  Additional Social History:                         Sleep: Poor  Appetite:  Fair  Current Medications:  Current Facility-Administered Medications  Medication Dose Route Frequency Provider Last Rate Last Admin   acetaminophen (TYLENOL) tablet 650 mg  650 mg Oral Q6H PRN Bing Neighbors, NP       alum & mag hydroxide-simeth (MAALOX/MYLANTA) 200-200-20 MG/5ML suspension 30 mL  30 mL Oral Q4H PRN Bing Neighbors, NP       buPROPion (WELLBUTRIN XL) 24 hr tablet 150 mg  150 mg Oral Daily Bing Neighbors, NP   150 mg at 09/09/23 0839   magnesium hydroxide (MILK OF MAGNESIA) suspension 30 mL  30 mL Oral Daily PRN Bing Neighbors, NP       OLANZapine (ZYPREXA) injection 10 mg  10 mg Intramuscular TID PRN Bing Neighbors, NP  OLANZapine (ZYPREXA) injection 5 mg  5 mg Intramuscular TID PRN Bing Neighbors, NP       OLANZapine zydis (ZYPREXA) disintegrating tablet 5 mg  5 mg Oral TID PRN Bing Neighbors, NP       QUEtiapine (SEROQUEL XR) 24 hr tablet 300 mg  300 mg Oral QHS Abbi Mancini, MD       QUEtiapine (SEROQUEL) tablet 50 mg  50 mg Oral Daily Giavana Rooke, MD   50 mg at 09/09/23 1230   QUEtiapine (SEROQUEL) tablet 50 mg  50 mg Oral Q6H PRN Miguel Rota, MD       traZODone (DESYREL) tablet 50 mg  50 mg Oral QHS PRN Bing Neighbors, NP       triamcinolone cream (KENALOG) 0.1 % cream   Topical TID Miguel Rota, MD   Given at 09/09/23 [phone]   Current Outpatient Medications  Medication Sig Dispense Refill    diphenhydrAMINE (BENADRYL) 25 MG tablet Take 1 tablet (25 mg total) by mouth every 6 (six) hours as needed for up to 7 days. (Patient not taking: Reported on 09/07/2023) 28 tablet 0   nystatin (MYCOSTATIN/NYSTOP) powder Apply 1 Application topically 3 (three) times daily. (Patient not taking: Reported on 09/07/2023) 15 g 0   triamcinolone cream (KENALOG) 0.1 % Apply 1 Application topically 2 (two) times daily. 30 g 0    Labs  Lab Results:  Admission on 09/06/2023, Discharged on 09/07/2023  Component Date Value Ref Range Status   WBC 09/06/2023 5.4  4.0 - 10.5 K/uL Final   RBC 09/06/2023 5.08  4.22 - 5.81 MIL/uL Final   Hemoglobin 09/06/2023 15.1  13.0 - 17.0 g/dL Final   HCT 57/84/6962 46.3  39.0 - 52.0 % Final   MCV 09/06/2023 91.1  80.0 - 100.0 fL Final   MCH 09/06/2023 29.7  26.0 - 34.0 pg Final   MCHC 09/06/2023 32.6  30.0 - 36.0 g/dL Final   RDW 95/28/4132 12.9  11.5 - 15.5 % Final   Platelets 09/06/2023 216  150 - 400 K/uL Final   nRBC 09/06/2023 0.0  0.0 - 0.2 % Final   Neutrophils Relative % 09/06/2023 44  % Final   Neutro Abs 09/06/2023 2.4  1.7 - 7.7 K/uL Final   Lymphocytes Relative 09/06/2023 42  % Final   Lymphs Abs 09/06/2023 2.3  0.7 - 4.0 K/uL Final   Monocytes Relative 09/06/2023 11  % Final   Monocytes Absolute 09/06/2023 0.6  0.1 - 1.0 K/uL Final   Eosinophils Relative 09/06/2023 2  % Final   Eosinophils Absolute 09/06/2023 0.1  0.0 - 0.5 K/uL Final   Basophils Relative 09/06/2023 1  % Final   Basophils Absolute 09/06/2023 0.0  0.0 - 0.1 K/uL Final   Immature Granulocytes 09/06/2023 0  % Final   Abs Immature Granulocytes 09/06/2023 0.01  0.00 - 0.07 K/uL Final   Performed at [facility], [address] [place], [place] [address]   Sodium 09/06/2023 138  135 - 145 mmol/L Final   Potassium 09/06/2023 3.9  3.5 - 5.1 mmol/L Final   Chloride 09/06/2023 103  98 - 111 mmol/L Final   CO2 09/06/2023 26  22 - 32 mmol/L Final   Glucose, Bld 09/06/2023 97  70 - 99 mg/dL  Final   Glucose reference range applies only to samples taken after fasting for at least 8 hours.   BUN 09/06/2023 10  6 - 20 mg/dL Final   Creatinine, Ser 09/06/2023 1.10  0.61 - 1.24 mg/dL Final   Calcium 40/98/1191 9.3  8.9 - 10.3 mg/dL Final   Total Protein 47/82/9562 7.1  6.5 - 8.1 g/dL Final   Albumin 13/02/6577 4.1  3.5 - 5.0 g/dL Final   AST 46/96/2952 18  15 - 41 U/L Final   ALT 09/06/2023 15  0 - 44 U/L Final   Alkaline Phosphatase 09/06/2023 39  38 - 126 U/L Final   Total Bilirubin 09/06/2023 0.9  0.0 - 1.2 mg/dL Final   GFR, Estimated 09/06/2023 >60  >60 mL/min Final   Comment: (NOTE) Calculated using the CKD-EPI Creatinine Equation (2021)    Anion gap 09/06/2023 9  5 - 15 Final   Performed at [facility], [address] [place], [place] [address]   Hgb A1c MFr Bld 09/06/2023 5.2  4.8 - 5.6 % Final   Comment: (NOTE) Pre diabetes:          5.7%-6.4%  Diabetes:              >6.4%  Glycemic control for   <7.0% adults with diabetes    Mean Plasma Glucose 09/06/2023 102.54  mg/dL Final   Performed at [facility], [address] [place], [place] [address]   Magnesium 09/06/2023 2.4  1.7 - 2.4 mg/dL Final   Performed at [facility], [address], [place], [place] [address]   Alcohol, Ethyl (B) 09/06/2023 <10  <10 mg/dL Final   Comment: (NOTE) Lowest detectable limit for serum alcohol is 10 mg/dL.  For medical purposes only. Performed at [facility], [address] [place], [place] [address]    Cholesterol 09/06/2023 191  0 - 200 mg/dL Final   Triglycerides 34/74/2595 129  <150 mg/dL Final   HDL 63/87/5643 53  >40 mg/dL Final   Total CHOL/HDL Ratio 09/06/2023 3.6  RATIO Final   VLDL 09/06/2023 26  0 - 40 mg/dL Final   LDL Cholesterol 09/06/2023 112 (H)  0 - 99 mg/dL Final   Comment:        Total Cholesterol/HDL:CHD Risk Coronary Heart Disease Risk Table                     Men   Women  1/2 Average Risk   3.4   3.3  Average Risk        5.0   4.4  2 X Average Risk   9.6   7.1  3 X Average Risk  23.4   11.0        Use the calculated Patient Ratio above and the CHD Risk Table to determine the patient's CHD Risk.        ATP III CLASSIFICATION (LDL):  <100     mg/dL   Optimal  329-518  mg/dL   Near or Above                    Optimal  130-159  mg/dL   Borderline  841-660  mg/dL   High  >630     mg/dL   Very High Performed at [facility], [address] [place], [place] [address]    TSH 09/06/2023 0.789  0.350 - 4.500 uIU/mL Final   Comment: Performed by a 3rd Generation assay with a functional sensitivity of <=0.01 uIU/mL. Performed at [facility], [address] [place], [place] [address]    RPR Ser Ql 09/06/2023 NON REACTIVE  NON REACTIVE Final  Performed at [facility], [address], [place], [place] [address]   Color, Urine 09/06/2023 YELLOW  YELLOW Final   APPearance 09/06/2023 CLEAR  CLEAR Final   Specific Gravity, Urine 09/06/2023 1.031 (H)  1.005 - 1.030 Final   pH 09/06/2023 5.0  5.0 - 8.0 Final   Glucose, UA 09/06/2023 NEGATIVE  NEGATIVE mg/dL Final   Hgb urine dipstick 09/06/2023 NEGATIVE  NEGATIVE Final   Bilirubin Urine 09/06/2023 NEGATIVE  NEGATIVE Final   Ketones, ur 09/06/2023 NEGATIVE  NEGATIVE mg/dL Final   Protein, ur 62/13/0865 NEGATIVE  NEGATIVE mg/dL Final   Nitrite 78/46/9629 NEGATIVE  NEGATIVE Final   Leukocytes,Ua 09/06/2023 NEGATIVE  NEGATIVE Final   Performed at [facility], [address] [place], [place] [address]   POC Amphetamine UR 09/06/2023 None Detected  NONE DETECTED (Cut Off Level 1000 ng/mL) Final   POC Secobarbital (BAR) 09/06/2023 None Detected  NONE DETECTED (Cut Off Level 300 ng/mL) Final   POC Buprenorphine (BUP) 09/06/2023 None Detected  NONE DETECTED (Cut Off Level 10 ng/mL) Final   POC Oxazepam (BZO) 09/06/2023 None Detected  NONE DETECTED (Cut Off Level 300 ng/mL) Final   POC Cocaine UR 09/06/2023 None Detected  NONE DETECTED (Cut  Off Level 300 ng/mL) Final   POC Methamphetamine UR 09/06/2023 None Detected  NONE DETECTED (Cut Off Level 1000 ng/mL) Final   POC Morphine 09/06/2023 None Detected  NONE DETECTED (Cut Off Level 300 ng/mL) Final   POC Methadone UR 09/06/2023 None Detected  NONE DETECTED (Cut Off Level 300 ng/mL) Final   POC Oxycodone UR 09/06/2023 None Detected  NONE DETECTED (Cut Off Level 100 ng/mL) Final   POC Marijuana UR 09/06/2023 Positive (A)  NONE DETECTED (Cut Off Level 50 ng/mL) Final  Admission on 06/27/2023, Discharged on 06/27/2023  Component Date Value Ref Range Status   Color, Urine 06/27/2023 YELLOW  YELLOW Final   APPearance 06/27/2023 CLEAR  CLEAR Final   Specific Gravity, Urine 06/27/2023 1.031 (H)  1.005 - 1.030 Final   pH 06/27/2023 5.0  5.0 - 8.0 Final   Glucose, UA 06/27/2023 NEGATIVE  NEGATIVE mg/dL Final   Hgb urine dipstick 06/27/2023 NEGATIVE  NEGATIVE Final   Bilirubin Urine 06/27/2023 NEGATIVE  NEGATIVE Final   Ketones, ur 06/27/2023 5 (A)  NEGATIVE mg/dL Final   Protein, ur 32/44/0102 30 (A)  NEGATIVE mg/dL Final   Nitrite 72/53/6644 NEGATIVE  NEGATIVE Final   Leukocytes,Ua 06/27/2023 NEGATIVE  NEGATIVE Final   RBC / HPF 06/27/2023 0-5  0 - 5 RBC/hpf Final   WBC, UA 06/27/2023 0-5  0 - 5 WBC/hpf Final   Bacteria, UA 06/27/2023 NONE SEEN  NONE SEEN Final   Squamous Epithelial / HPF 06/27/2023 0-5  0 - 5 /HPF Final   Mucus 06/27/2023 PRESENT   Final   Hyaline Casts, UA 06/27/2023 PRESENT   Final   Performed at [facility], [address] [place], [place] [address]   Glucose-Capillary 06/27/2023 135 (H)  70 - 99 mg/dL Final   Glucose reference range applies only to samples taken after fasting for at least 8 hours.   Alcohol, Ethyl (B) 06/27/2023 <10  <10 mg/dL Final   Comment: (NOTE) Lowest detectable limit for serum alcohol is 10 mg/dL.  For medical purposes only. Performed at [facility], [address], [place], [place]  [address]    Opiates 06/27/2023 NONE DETECTED  NONE DETECTED Final   Cocaine 06/27/2023 NONE DETECTED  NONE DETECTED Final  Benzodiazepines 06/27/2023 NONE DETECTED  NONE DETECTED Final   Amphetamines 06/27/2023 NONE DETECTED  NONE DETECTED Final   Tetrahydrocannabinol 06/27/2023 POSITIVE (A)  NONE DETECTED Final   Barbiturates 06/27/2023 NONE DETECTED  NONE DETECTED Final   Comment: (NOTE) DRUG SCREEN FOR MEDICAL PURPOSES ONLY.  IF CONFIRMATION IS NEEDED FOR ANY PURPOSE, NOTIFY LAB WITHIN 5 DAYS.  LOWEST DETECTABLE LIMITS FOR URINE DRUG SCREEN Drug Class                     Cutoff (ng/mL) Amphetamine and metabolites    1000 Barbiturate and metabolites    200 Benzodiazepine                 200 Opiates and metabolites        300 Cocaine and metabolites        300 THC                            50 Performed at [facility], [address], [place], [place] [address]    Sodium 06/27/2023 137  135 - 145 mmol/L Final   Potassium 06/27/2023 3.6  3.5 - 5.1 mmol/L Final   Chloride 06/27/2023 104  98 - 111 mmol/L Final   CO2 06/27/2023 22  22 - 32 mmol/L Final   Glucose, Bld 06/27/2023 86  70 - 99 mg/dL Final   Glucose reference range applies only to samples taken after fasting for at least 8 hours.   BUN 06/27/2023 18  6 - 20 mg/dL Final   Creatinine, Ser 06/27/2023 1.13  0.61 - 1.24 mg/dL Final   Calcium 60/45/4098 9.3  8.9 - 10.3 mg/dL Final   Total Protein 11/91/4782 7.7  6.5 - 8.1 g/dL Final   Albumin 95/62/1308 4.3  3.5 - 5.0 g/dL Final   AST 65/78/4696 27  15 - 41 U/L Final   ALT 06/27/2023 21  0 - 44 U/L Final   Alkaline Phosphatase 06/27/2023 37 (L)  38 - 126 U/L Final   Total Bilirubin 06/27/2023 1.2 (H)  <1.2 mg/dL Final   GFR, Estimated 06/27/2023 >60  >60 mL/min Final   Comment: (NOTE) Calculated using the CKD-EPI Creatinine Equation (2021)    Anion gap 06/27/2023 11  5 - 15 Final   Performed at [facility], [address]  [place], [place] [address]   WBC 06/27/2023 6.6  4.0 - 10.5 K/uL Final   RBC 06/27/2023 4.93  4.22 - 5.81 MIL/uL Final   Hemoglobin 06/27/2023 15.3  13.0 - 17.0 g/dL Final   HCT 41/32/4401 45.2  39.0 - 52.0 % Final   MCV 06/27/2023 91.7  80.0 - 100.0 fL Final   MCH 06/27/2023 31.0  26.0 - 34.0 pg Final   MCHC 06/27/2023 33.8  30.0 - 36.0 g/dL Final   RDW 02/72/5366 12.7  11.5 - 15.5 % Final   Platelets 06/27/2023 219  150 - 400 K/uL Final   nRBC 06/27/2023 0.0  0.0 - 0.2 % Final   Neutrophils Relative % 06/27/2023 60  % Final   Neutro Abs 06/27/2023 4.0  1.7 - 7.7 K/uL Final   Lymphocytes Relative 06/27/2023 28  % Final   Lymphs Abs 06/27/2023 1.8  0.7 - 4.0 K/uL Final   Monocytes Relative 06/27/2023 11  % Final   Monocytes Absolute 06/27/2023 0.7  0.1 - 1.0 K/uL Final   Eosinophils Relative 06/27/2023 1  % Final  Eosinophils Absolute 06/27/2023 0.0  0.0 - 0.5 K/uL Final   Basophils Relative 06/27/2023 0  % Final   Basophils Absolute 06/27/2023 0.0  0.0 - 0.1 K/uL Final   Immature Granulocytes 06/27/2023 0  % Final   Abs Immature Granulocytes 06/27/2023 0.01  0.00 - 0.07 K/uL Final   Performed at [facility], [address], [place], [place] [address]    Blood Alcohol level:  Lab Results  Component Value Date   [facility] <10 09/06/2023   ETH <10 06/27/2023    Metabolic Disorder Labs: Lab Results  Component Value Date   HGBA1C 5.2 09/06/2023   MPG 102.54 09/06/2023   MPG 111.15 11/04/2021   Lab Results  Component Value Date   PROLACTIN 33.2 (H) 08/15/2016   Lab Results  Component Value Date   CHOL 191 09/06/2023   TRIG 129 09/06/2023   HDL 53 09/06/2023   CHOLHDL 3.6 09/06/2023   VLDL 26 09/06/2023   LDLCALC 112 (H) 09/06/2023   LDLCALC 103 (H) 11/04/2021    Therapeutic Lab Levels: No results found for: "LITHIUM" Lab Results  Component Value Date   VALPROATE 68 12/25/2021   VALPROATE <10 (L) 06/30/2021   No results found for: "CBMZ"  Physical  Findings   AIMS    Flowsheet Row Admission (Discharged) from 12/07/2021 in [facility] INPATIENT BEHAVIORAL MEDICINE Admission (Discharged) from 10/01/2020 in [facility] INPATIENT BEHAVIORAL MEDICINE Admission (Discharged) from 08/20/2020 in [facility] INPATIENT BEHAVIORAL MEDICINE Admission (Discharged[facility] (Discharged) from 11/10/2019 in [facility] INPATIENT BEHAVIORAL MEDICINE  AIMS Total Score 0 0 0 0 0      AUDIT    Flowsheet Row ED from 09/07/2023 in [facility] Admission (Discharged) from 12/07/2021 in [facility] INPATIENT BEHAVIORAL MEDICINE Admission (Discharged) from 11/01/2021 in [facility] INPATIENT BEHAVIORAL MEDICINE Admission (Discharged) from 06/15/2021 in [facility] INPATIENT BEHAVIORAL MEDICINE Admission (Discharged) from 10/01/2020 in [facility] INPATIENT BEHAVIORAL MEDICINE  Alcohol Use Disorder Identification Test Final Score (AUDIT) 19 0 0 0 2      ECT-MADRS    Flowsheet Row Admission (Discharged) from 11/01/2021 in [facility] INPATIENT BEHAVIORAL MEDICINE  MADRS Total Score 14      GAD-7    Flowsheet Row Office Visit from 07/04/2023 in [facility]  Total GAD-7 Score 18      PHQ2-9    Flowsheet Row ED from 09/07/2023 in [facility] ED from 09/06/2023 in [facility] Office Visit from 07/04/2023 in [facility] Counselor from 12/31/2019 in [facility]  PHQ-2 Total Score 2 2 5 3   PHQ-9 Total Score 8 7 24 14       Flowsheet Row ED from 09/07/2023 in [facility] ED from 09/06/2023 in [facility] ED from 09/03/2023 in [facility] Emergency Department at [facility]  C-SSRS RISK CATEGORY Low Risk Error: Q7 should not be populated when Q6 is No No Risk        Musculoskeletal  Strength & Muscle Tone: within normal limits Gait & Station: normal Patient leans:  N/A  Psychiatric Specialty Exam  Presentation  General Appearance:  Appropriate for Environment  Eye Contact: Fair  Speech: Normal Rate  Speech Volume: Decreased  Handedness: Right   Mood and Affect  Mood: Anxious; Irritable  Affect: Congruent   Thought Process  Thought Processes: Coherent  Descriptions of Associations:Intact  Orientation:Full (Time, Place and Person)  Thought Content:Logical  Diagnosis of Schizophrenia or Schizoaffective disorder in past: No data recorded Duration of Psychotic Symptoms: Less than six months   Hallucinations:Hallucinations: Visual Description of Auditory Hallucinations: denies Description of Visual Hallucinations: "spots"  Ideas of Reference:None  Suicidal Thoughts:Suicidal Thoughts: No  Homicidal Thoughts:Homicidal Thoughts: No   Sensorium  Memory: Immediate Fair  Judgment: Poor  Insight: Fair   Chartered certified accountant: Fair  Attention Span: Fair  Recall: [employer] of Knowledge: Fair  Language: Fair   Psychomotor Activity  Psychomotor Activity: Psychomotor Activity: Normal   Assets  Assets: Communication Skills; Desire for Improvement; Physical Health   Sleep  Sleep: Sleep: Fair Number of Hours of Sleep: 5   No data recorded  Physical Exam  Physical Exam Review of Systems  Constitutional: Negative.   HENT: Negative.    Eyes: Negative.   Respiratory: Negative.    Cardiovascular: Negative.   Gastrointestinal: Negative.   Genitourinary: Negative.   Musculoskeletal: Negative.   Skin: Negative.   Neurological: Negative.   Endo/Heme/Allergies: Negative.   Psychiatric/Behavioral:  Positive for depression and hallucinations.    Blood pressure (!) 92/59, pulse 73, temperature 98.2 F (36.8 C), temperature source Oral, resp. rate 20, SpO2 99%. There is no height or weight on file to calculate BMI.  Treatment Plan Summary: Daily contact with patient to assess and  evaluate symptoms and progress in treatment, Medication management, and Plan increase SERoqeul to 300 mg at bedtime + 50 mg qdaily + 50 mg q6h-PRN for anxiety or intrusive thoughts, Continue Wellbutrin 150 mg qdaily Patient has refused rehab referrals and has been dismissive but is more interested in medication management today  Miguel Rota, MD 09/09/2023 2:03 PM

## 2023-09-09 NOTE — [facility] Group Notes (Signed)
 SPIRITUALITY GROUP NOTE  Spirituality group facilitated by Wilkie Aye, MDiv, BCC.  Group Description: Group focused on topic of hope. Patients participated in facilitated discussion around topic, connecting with one another around experiences and definitions for hope. Group members engaged with visual explorer photos, reflecting on what hope looks like for them today. Group engaged in discussion around how their definitions of hope are present today in hospital.  Modalities: Psycho-social ed, Adlerian, Narrative, MI  Patient Progress: Timothy [name] was present throughout group.  Actively participated in group discussion.

## 2023-09-09 NOTE — Progress Notes (Signed)
 Pt was visible in the milieu and attended groups. No distress noted or concerns voiced. Staff will monitor for pt's safety.

## 2023-09-09 NOTE — Group Note (Signed)
 Group Topic: Core Beliefs  Group Date: 09/09/2023 Start Time: 0145 End Time: 0245 Facilitators: Loleta Dicker, LCSW  Department: [facility]  Number of Participants: 5  Group Focus: check in and clarity of thought Treatment Modality:  Cognitive Behavioral Therapy Interventions utilized were exploration, story telling, and support Purpose: improve communication skills  Name: Timothy [name] Date of Birth: 1996/05/22  MR: [id number]    Level of Participation: minimal Quality of Participation: cooperative Interactions with others: minimal Mood/Affect: flat Triggers (if applicable): N/A Cognition: limited Progress: Moderate Response: Patient participated in group on today. Patient expressed understanding of group rules and confidentiality. Patient was able to mention his first thought regarding certain words and stigmas attached to mental health and substance use. Patient reported feeling like "other people want him to do things on their timing". Patient spoke about nothing related to the topic of discussion and appeared to be limited in his understanding of the group.  Patient reports his goal is to find housing for himself. No issues to report.  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

## 2023-09-10 DIAGNOSIS — F1220 Cannabis dependence, uncomplicated: Secondary | ICD-10-CM | POA: Diagnosis not present

## 2023-09-10 DIAGNOSIS — F339 Major depressive disorder, recurrent, unspecified: Secondary | ICD-10-CM | POA: Diagnosis not present

## 2023-09-10 MED ORDER — DIVALPROEX SODIUM 500 MG PO DR TAB
500.0000 mg | DELAYED_RELEASE_TABLET | Freq: Two times a day (BID) | ORAL | Status: DC
Start: 2023-09-10 — End: 2023-09-12
  Administered 2023-09-10 – 2023-09-12 (×5): 500 mg via ORAL
  Filled 2023-09-10 (×5): qty 1

## 2023-09-10 NOTE — ED Notes (Signed)
 Patient sitting in dayroom interacting with peers, patient is eating lunch. No acute distress noted. No concerns voiced. Informed patient to notify staff with any needs or assistance. Patient verbalized understanding or agreement. Safety checks in place per facility policy.

## 2023-09-10 NOTE — ED Notes (Signed)
 Patient resting with eyes closed in no apparent acute distress. Respirations even and unlabored. Environment secured. Safety checks in place according to facility policy.

## 2023-09-10 NOTE — ED Notes (Signed)
 Patient is in the bedroom sleeping. NAD. Respirations even and unlabored. Will monitor for safety.

## 2023-09-10 NOTE — ED Notes (Signed)
 Pt was provided lunch

## 2023-09-10 NOTE — ED Notes (Signed)
 Patient alert & oriented x4. Denies intent to harm self or others when asked. Denies A/VH. Patient denies any physical complaints when asked. No acute distress noted. Support and encouragement provided. Routine safety checks conducted per facility protocol. Encouraged patient to notify staff if any thoughts of harm towards self or others arise. Patient verbalizes understanding and agreement.

## 2023-09-10 NOTE — ED Notes (Signed)
 Patient in bedroom, calm and composed. No acute distress noted. No concerns voiced. Informed patient to notify staff with any needs or assistance. Patient verbalized understanding or agreement. Safety checks in place per facility policy.

## 2023-09-10 NOTE — ED Notes (Signed)
 PRN Tylenol given due to patient reports of pain in back rating 8/10. Medication administered with no complications. Environment secured, safety checks in place per facility policy.

## 2023-09-10 NOTE — Group Note (Signed)
 Group Topic: Relaxation  Group Date: 09/10/2023 Start Time: 1000 End Time: 1100 Facilitators: Londell Moh, NT  Department: [facility]  Number of Participants: 10  Group Focus: art therapy and check in Treatment Modality:  Art Therapy and Psychoeducation Interventions utilized were group exercise Purpose: enhance coping skills  Name: Timothy [name] Date of Birth: 04-25-1996  MR: [id number]    Level of Participation: active Quality of Participation: attentive Interactions with others: gave feedback Mood/Affect: appropriate Triggers (if applicable): n/a Cognition: coherent/clear Progress: Gaining insight Response: n/a Plan: patient will be encouraged to attend future groups.  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

## 2023-09-10 NOTE — Group Note (Signed)
 Group Topic: Relapse and Recovery  Group Date: 09/10/2023 Start Time: 2005 End Time: 2100 Facilitators: Darin Engels  Department: [facility]  Number of Participants: 4  Group Focus: acceptance, coping skills, goals/reality orientation, relapse prevention, self-awareness, and substance abuse education Treatment Modality:  Leisure Development Interventions utilized were reminiscence, story telling, and support Purpose: enhance coping skills, express feelings, increase insight, relapse prevention strategies, and trigger / craving management  Name: Timothy [name] Date of Birth: [birth year]  MR: [id number]    Level of Participation: pt did not attend group  Quality of Participation: pt did not attend group  Interactions with others: minimal but positive  Mood/Affect: appropriate Triggers (if applicable): n/a Cognition: coherent/clear Progress: Moderate Response: pt declined attending group and wanted to continue resting.  Plan: patient will be encouraged to attend groups.   

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
## 2023-09-10 NOTE — ED Notes (Signed)
 Patient is resting in bed with eyes closed and aroused to name being called. He is wearing his personal clothing. He denies pain, HI, and AH and reports anxiety and depression 9. He reports feeling paranoid and endorses SI w/o plan. Patient reports VH, seeing people that is not there. Patient will continued to be monitored for safety per protocol and for changes in condition.

## 2023-09-10 NOTE — ED Notes (Signed)
 Pt was provided breakfast.

## 2023-09-10 NOTE — ED Notes (Signed)
 Pt was provided dinner.

## 2023-09-10 NOTE — ED Notes (Signed)
 Patient is resting in bed with eyes closed, even unlabored breathing. No s/s of discomfort. Patient will be continued to be monitored for safety per protocol and for changes in condition.

## 2023-09-11 DIAGNOSIS — F1220 Cannabis dependence, uncomplicated: Secondary | ICD-10-CM | POA: Diagnosis not present

## 2023-09-11 MED ORDER — QUETIAPINE FUMARATE ER 200 MG PO TB24
400.0000 mg | ORAL_TABLET | Freq: Every day | ORAL | Status: DC
  Administered 2023-09-11: 400 mg via ORAL
  Filled 2023-09-11: qty 2

## 2023-09-11 NOTE — ED Notes (Signed)
 Pt in dayroom at this time eating lunch. Pt in NAD, calm and cooperative. Will continue to monitor.

## 2023-09-11 NOTE — ED Notes (Signed)
 Pt was in the dayroom with other pt's watching TV. Pt then went to his room. Pt in NAD.

## 2023-09-11 NOTE — Group Note (Signed)
 Group Topic: Relaxation  Group Date: 09/11/2023 Start Time: 1625 End Time: 1655 Facilitators: Evelina Bucy, RN  Department: [facility]  Number of Participants: 5  Group Focus: check in, communication, relaxation, and social skills Treatment Modality:  Interpersonal Therapy Interventions utilized were group exercise and story telling Purpose: improve communication skills  Name: Timothy [name] Date of Birth: February 07, 1996  MR: [id number]    Level of Participation: active Quality of Participation: attentive, cooperative, and engaged Interactions with others: gave feedback Mood/Affect: appropriate Triggers (if applicable):  Cognition: coherent/clear Progress: Gaining insight Response:  Plan: follow-up needed  Patients Problems:  Patient Active Problem List   Diagnosis Date Noted   MDD (major depressive disorder), recurrent episode, with atypical features (HCC) 09/07/2023   Schizoaffective disorder (HCC) 06/26/2021   Homelessness 11/06/2020   Benzodiazepine abuse (HCC) 08/13/2016   Schizoaffective disorder, depressive type (HCC) 08/12/2016   Schizoaffective disorder, bipolar type (HCC) 09/18/2014   Tobacco use disorder 09/18/2014   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

## 2023-09-11 NOTE — ED Provider Notes (Signed)
 Behavioral Health Progress Note  Date and Time: 09/11/2023 2:54 PM Name: Timothy [name] MRN:  [id number]  Subjective:  Patient reports overall doing well.  Mood has been fair.  Reports that he feels oversedated during the daytime after taking medications.  He was amenable to increasing the quetiapine at night and discontinuing the daytime Seroquel.  He denies SI/HI/AVH.  He continues to have VH.   Diagnosis:  Final diagnoses:  Cannabis use disorder, severe, dependence (HCC)  Alcohol abuse  Intermittent explosive disorder  Schizoaffective disorder ,depressed type, rule out substance induced mood disorder versus malingering   Total Time spent with patient: 20 minutes  Past Psychiatric History: Schizoaffective disorder, depression   Reports 1 prior admission: . He was last hospitalized at [facility] in June 2023 under Dr Toni Amend and was discharged on the following medications:    "buPROPion 300 MG 24 hr table PO Daily divalproex 500 MG DR tablet PO  Q 12 hrs hydrOXYzine 50 MG tablet  PO TID PRN QUEtiapine 400 MG tablet  PO HS"   Suicide attempts: pateint denies     Past Medical History: Dermatitis, atopic     Past Medical History:      Past Medical History:  Diagnosis Date   Cannabis abuse     Paranoid schizophrenia (HCC)     Schizoaffective disorder, depressive type (HCC) 09/17/2014          Family History:       Family History  Problem Relation Age of Onset   Mental illness Brother     Drug abuse Brother     Mental illness Cousin     Suicidality Cousin     Diabetes Maternal Grandmother            Social History: Patient is homeless and unemployed. Patient is from [place] and states he has family in the area but is not on close terms with them.  Additional Social History:                         Sleep: Poor  Appetite:  Fair  Current Medications:  Current Facility-Administered Medications  Medication Dose Route Frequency Provider Last Rate Last  Admin   acetaminophen (TYLENOL) tablet 650 mg  650 mg Oral Q6H PRN Bing Neighbors, NP   650 mg at 09/10/23 1503   alum & mag hydroxide-simeth (MAALOX/MYLANTA) 200-200-20 MG/5ML suspension 30 mL  30 mL Oral Q4H PRN Bing Neighbors, NP       buPROPion (WELLBUTRIN XL) 24 hr tablet 150 mg  150 mg Oral Daily Bing Neighbors, NP   150 mg at 09/11/23 7829   divalproex (DEPAKOTE) DR tablet 500 mg  500 mg Oral Threasa Alpha, MD   500 mg at 09/11/23 0931   magnesium hydroxide (MILK OF MAGNESIA) suspension 30 mL  30 mL Oral Daily PRN Bing Neighbors, NP       OLANZapine (ZYPREXA) injection 10 mg  10 mg Intramuscular TID PRN Bing Neighbors, NP       OLANZapine (ZYPREXA) injection 5 mg  5 mg Intramuscular TID PRN Bing Neighbors, NP       OLANZapine zydis (ZYPREXA) disintegrating tablet 5 mg  5 mg Oral TID PRN Bing Neighbors, NP       QUEtiapine (SEROQUEL XR) 24 hr tablet 400 mg  400 mg Oral QHS Park Pope, MD       traZODone (DESYREL) tablet 50 mg  50 mg  Oral QHS PRN Bing Neighbors, NP       triamcinolone cream (KENALOG) 0.1 % cream   Topical TID Miguel Rota, MD   Given at 09/11/23 1610   Current Outpatient Medications  Medication Sig Dispense Refill   diphenhydrAMINE (BENADRYL) 25 MG tablet Take 1 tablet (25 mg total) by mouth every 6 (six) hours as needed for up to 7 days. (Patient not taking: Reported on 09/07/2023) 28 tablet 0   nystatin (MYCOSTATIN/NYSTOP) powder Apply 1 Application topically 3 (three) times daily. (Patient not taking: Reported on 09/07/2023) 15 g 0   triamcinolone cream (KENALOG) 0.1 % Apply 1 Application topically 2 (two) times daily. 30 g 0    Labs  Lab Results:  Admission on 09/06/2023, Discharged on 09/07/2023  Component Date Value Ref Range Status   WBC 09/06/2023 5.4  4.0 - 10.5 K/uL Final   RBC 09/06/2023 5.08  4.22 - 5.81 MIL/uL Final   Hemoglobin 09/06/2023 15.1  13.0 - 17.0 g/dL Final   HCT 96/10/5407 46.3  39.0 - 52.0 % Final   MCV  09/06/2023 91.1  80.0 - 100.0 fL Final   MCH 09/06/2023 29.7  26.0 - 34.0 pg Final   MCHC 09/06/2023 32.6  30.0 - 36.0 g/dL Final   RDW 81/19/1478 12.9  11.5 - 15.5 % Final   Platelets 09/06/2023 216  150 - 400 K/uL Final   nRBC 09/06/2023 0.0  0.0 - 0.2 % Final   Neutrophils Relative % 09/06/2023 44  % Final   Neutro Abs 09/06/2023 2.4  1.7 - 7.7 K/uL Final   Lymphocytes Relative 09/06/2023 42  % Final   Lymphs Abs 09/06/2023 2.3  0.7 - 4.0 K/uL Final   Monocytes Relative 09/06/2023 11  % Final   Monocytes Absolute 09/06/2023 0.6  0.1 - 1.0 K/uL Final   Eosinophils Relative 09/06/2023 2  % Final   Eosinophils Absolute 09/06/2023 0.1  0.0 - 0.5 K/uL Final   Basophils Relative 09/06/2023 1  % Final   Basophils Absolute 09/06/2023 0.0  0.0 - 0.1 K/uL Final   Immature Granulocytes 09/06/2023 0  % Final   Abs Immature Granulocytes 09/06/2023 0.01  0.00 - 0.07 K/uL Final   Performed at [facility], [address] [place], [place] [address]   Sodium 09/06/2023 138  135 - 145 mmol/L Final   Potassium 09/06/2023 3.9  3.5 - 5.1 mmol/L Final   Chloride 09/06/2023 103  98 - 111 mmol/L Final   CO2 09/06/2023 26  22 - 32 mmol/L Final   Glucose, Bld 09/06/2023 97  70 - 99 mg/dL Final   Glucose reference range applies only to samples taken after fasting for at least 8 hours.   BUN 09/06/2023 10  6 - 20 mg/dL Final   Creatinine, Ser 09/06/2023 1.10  0.61 - 1.24 mg/dL Final   Calcium 13/02/6577 9.3  8.9 - 10.3 mg/dL Final   Total Protein 46/96/2952 7.1  6.5 - 8.1 g/dL Final   Albumin 84/13/2440 4.1  3.5 - 5.0 g/dL Final   AST 05/01/2535 18  15 - 41 U/L Final   ALT 09/06/2023 15  0 - 44 U/L Final   Alkaline Phosphatase 09/06/2023 39  38 - 126 U/L Final   Total Bilirubin 09/06/2023 0.9  0.0 - 1.2 mg/dL Final   GFR, Estimated 09/06/2023 >60  >60 mL/min Final   Comment: (NOTE) Calculated using the CKD-EPI Creatinine Equation (2021)    Anion gap 09/06/2023 9  5 - 15  Final   Performed at  [facility], [address] [place], [place] [address]   Hgb A1c MFr Bld 09/06/2023 5.2  4.8 - 5.6 % Final   Comment: (NOTE) Pre diabetes:          5.7%-6.4%  Diabetes:              >6.4%  Glycemic control for   <7.0% adults with diabetes    Mean Plasma Glucose 09/06/2023 102.54  mg/dL Final   Performed at [facility], [address], [place], [place] [address]   Magnesium 09/06/2023 2.4  1.7 - 2.4 mg/dL Final   Performed at [facility], [address], [place], [place] [address]   Alcohol, Ethyl (B) 09/06/2023 <10  <10 mg/dL Final   Comment: (NOTE) Lowest detectable limit for serum alcohol is 10 mg/dL.  For medical purposes only. Performed at [facility], [address], [place], [place] [address]    Cholesterol 09/06/2023 191  0 - 200 mg/dL Final   Triglycerides 25/36/6440 129  <150 mg/dL Final   HDL 34/74/2595 53  >40 mg/dL Final   Total CHOL/HDL Ratio 09/06/2023 3.6  RATIO Final   VLDL 09/06/2023 26  0 - 40 mg/dL Final   LDL Cholesterol 09/06/2023 112 (H)  0 - 99 mg/dL Final   Comment:        Total Cholesterol/HDL:CHD Risk Coronary Heart Disease Risk Table                     Men   Women  1/2 Average Risk   3.4   3.3  Average Risk       5.0   4.4  2 X Average Risk   9.6   7.1  3 X Average Risk  23.4   11.0        Use the calculated Patient Ratio above and the CHD Risk Table to determine the patient's CHD Risk.        ATP III CLASSIFICATION (LDL):  <100     mg/dL   Optimal  638-756  mg/dL   Near or Above                    Optimal  130-159  mg/dL   Borderline  433-295  mg/dL   High  >188     mg/dL   Very High Performed at [facility], [address], [place], [place] [address]    TSH 09/06/2023 0.789  0.350 - 4.500 uIU/mL Final   Comment: Performed by a 3rd Generation assay with a functional sensitivity of <=0.01 uIU/mL. Performed at [facility], [address] [place], [place] [address]    RPR Ser Ql 09/06/2023 NON  REACTIVE  NON REACTIVE Final   Performed at [facility], [address], [place], [place] [address]   Color, Urine 09/06/2023 YELLOW  YELLOW Final   APPearance 09/06/2023 CLEAR  CLEAR Final   Specific Gravity, Urine 09/06/2023 1.031 (H)  1.005 - 1.030 Final   pH 09/06/2023 5.0  5.0 - 8.0 Final   Glucose, UA 09/06/2023 NEGATIVE  NEGATIVE mg/dL Final   Hgb urine dipstick 09/06/2023 NEGATIVE  NEGATIVE Final   Bilirubin Urine 09/06/2023 NEGATIVE  NEGATIVE Final   Ketones, ur 09/06/2023 NEGATIVE  NEGATIVE mg/dL Final   Protein, ur 23/55/7322 NEGATIVE  NEGATIVE mg/dL Final   Nitrite 02/54/2706 NEGATIVE  NEGATIVE Final   Leukocytes,Ua 09/06/2023  NEGATIVE  NEGATIVE Final   Performed at [facility], [address], [place], [place] [address]   POC Amphetamine UR 09/06/2023 None Detected  NONE DETECTED (Cut Off Level 1000 ng/mL) Final   POC Secobarbital (BAR) 09/06/2023 None Detected  NONE DETECTED (Cut Off Level 300 ng/mL) Final   POC Buprenorphine (BUP) 09/06/2023 None Detected  NONE DETECTED (Cut Off Level 10 ng/mL) Final   POC Oxazepam (BZO) 09/06/2023 None Detected  NONE DETECTED (Cut Off Level 300 ng/mL) Final   POC Cocaine UR 09/06/2023 None Detected  NONE DETECTED (Cut Off Level 300 ng/mL) Final   POC Methamphetamine UR 09/06/2023 None Detected  NONE DETECTED (Cut Off Level 1000 ng/mL) Final   POC Morphine 09/06/2023 None Detected  NONE DETECTED (Cut Off Level 300 ng/mL) Final   POC Methadone UR 09/06/2023 None Detected  NONE DETECTED (Cut Off Level 300 ng/mL) Final   POC Oxycodone UR 09/06/2023 None Detected  NONE DETECTED (Cut Off Level 100 ng/mL) Final   POC Marijuana UR 09/06/2023 Positive (A)  NONE DETECTED (Cut Off Level 50 ng/mL) Final  Admission on 06/27/2023, Discharged on 06/27/2023  Component Date Value Ref Range Status   Color, Urine 06/27/2023 YELLOW  YELLOW Final   APPearance 06/27/2023 CLEAR  CLEAR Final   Specific Gravity, Urine 06/27/2023 1.031 (H)  1.005 -  1.030 Final   pH 06/27/2023 5.0  5.0 - 8.0 Final   Glucose, UA 06/27/2023 NEGATIVE  NEGATIVE mg/dL Final   Hgb urine dipstick 06/27/2023 NEGATIVE  NEGATIVE Final   Bilirubin Urine 06/27/2023 NEGATIVE  NEGATIVE Final   Ketones, ur 06/27/2023 5 (A)  NEGATIVE mg/dL Final   Protein, ur 91/47/8295 30 (A)  NEGATIVE mg/dL Final   Nitrite 62/13/0865 NEGATIVE  NEGATIVE Final   Leukocytes,Ua 06/27/2023 NEGATIVE  NEGATIVE Final   RBC / HPF 06/27/2023 0-5  0 - 5 RBC/hpf Final   WBC, UA 06/27/2023 0-5  0 - 5 WBC/hpf Final   Bacteria, UA 06/27/2023 NONE SEEN  NONE SEEN Final   Squamous Epithelial / HPF 06/27/2023 0-5  0 - 5 /HPF Final   Mucus 06/27/2023 PRESENT   Final   Hyaline Casts, UA 06/27/2023 PRESENT   Final   Performed at [facility], [address] [place], [place] [address]   Glucose-Capillary 06/27/2023 135 (H)  70 - 99 mg/dL Final   Glucose reference range applies only to samples taken after fasting for at least 8 hours.   Alcohol, Ethyl (B) 06/27/2023 <10  <10 mg/dL Final   Comment: (NOTE) Lowest detectable limit for serum alcohol is 10 mg/dL.  For medical purposes only. Performed at [facility], [address], [place], [place] [address]    Opiates 06/27/2023 NONE DETECTED  NONE DETECTED Final   Cocaine 06/27/2023 NONE DETECTED  NONE DETECTED Final   Benzodiazepines 06/27/2023 NONE DETECTED  NONE DETECTED Final   Amphetamines 06/27/2023 NONE DETECTED  NONE DETECTED Final   Tetrahydrocannabinol 06/27/2023 POSITIVE (A)  NONE DETECTED Final   Barbiturates 06/27/2023 NONE DETECTED  NONE DETECTED Final   Comment: (NOTE) DRUG SCREEN FOR MEDICAL PURPOSES ONLY.  IF CONFIRMATION IS NEEDED FOR ANY PURPOSE, NOTIFY LAB WITHIN 5 DAYS.  LOWEST DETECTABLE LIMITS FOR URINE DRUG SCREEN Drug Class                     Cutoff (ng/mL) Amphetamine and metabolites    1000 Barbiturate and metabolites    200 Benzodiazepine  200 Opiates and  metabolites        300 Cocaine and metabolites        300 THC                            50 Performed at [facility], [address], [place], [place] [address]    Sodium 06/27/2023 137  135 - 145 mmol/L Final   Potassium 06/27/2023 3.6  3.5 - 5.1 mmol/L Final   Chloride 06/27/2023 104  98 - 111 mmol/L Final   CO2 06/27/2023 22  22 - 32 mmol/L Final   Glucose, Bld 06/27/2023 86  70 - 99 mg/dL Final   Glucose reference range applies only to samples taken after fasting for at least 8 hours.   BUN 06/27/2023 18  6 - 20 mg/dL Final   Creatinine, Ser 06/27/2023 1.13  0.61 - 1.24 mg/dL Final   Calcium 60/45/4098 9.3  8.9 - 10.3 mg/dL Final   Total Protein 11/91/4782 7.7  6.5 - 8.1 g/dL Final   Albumin 95/62/1308 4.3  3.5 - 5.0 g/dL Final   AST 65/78/4696 27  15 - 41 U/L Final   ALT 06/27/2023 21  0 - 44 U/L Final   Alkaline Phosphatase 06/27/2023 37 (L)  38 - 126 U/L Final   Total Bilirubin 06/27/2023 1.2 (H)  <1.2 mg/dL Final   GFR, Estimated 06/27/2023 >60  >60 mL/min Final   Comment: (NOTE) Calculated using the CKD-EPI Creatinine Equation (2021)    Anion gap 06/27/2023 11  5 - 15 Final   Performed at [facility], [address] [place], [place] [address]   WBC 06/27/2023 6.6  4.0 - 10.5 K/uL Final   RBC 06/27/2023 4.93  4.22 - 5.81 MIL/uL Final   Hemoglobin 06/27/2023 15.3  13.0 - 17.0 g/dL Final   HCT 41/32/4401 45.2  39.0 - 52.0 % Final   MCV 06/27/2023 91.7  80.0 - 100.0 fL Final   MCH 06/27/2023 31.0  26.0 - 34.0 pg Final   MCHC 06/27/2023 33.8  30.0 - 36.0 g/dL Final   RDW 02/72/5366 12.7  11.5 - 15.5 % Final   Platelets 06/27/2023 219  150 - 400 K/uL Final   nRBC 06/27/2023 0.0  0.0 - 0.2 % Final   Neutrophils Relative % 06/27/2023 60  % Final   Neutro Abs 06/27/2023 4.0  1.7 - 7.7 K/uL Final   Lymphocytes Relative 06/27/2023 28  % Final   Lymphs Abs 06/27/2023 1.8  0.7 - 4.0 K/uL Final   Monocytes Relative 06/27/2023 11  % Final    Monocytes Absolute 06/27/2023 0.7  0.1 - 1.0 K/uL Final   Eosinophils Relative 06/27/2023 1  % Final   Eosinophils Absolute 06/27/2023 0.0  0.0 - 0.5 K/uL Final   Basophils Relative 06/27/2023 0  % Final   Basophils Absolute 06/27/2023 0.0  0.0 - 0.1 K/uL Final   Immature Granulocytes 06/27/2023 0  % Final   Abs Immature Granulocytes 06/27/2023 0.01  0.00 - 0.07 K/uL Final   Performed at [facility], [address], [place], [place] [address]    Blood Alcohol level:  Lab Results  Component Value Date   [facility] <10 09/06/2023   ETH <10 06/27/2023    Metabolic Disorder Labs: Lab Results  Component Value Date   HGBA1C 5.2 09/06/2023   MPG 102.54 09/06/2023   MPG 111.15 11/04/2021   Lab Results  Component Value Date   PROLACTIN 33.2 (H) 08/15/2016   Lab Results  Component Value Date   CHOL 191 09/06/2023   TRIG 129 09/06/2023   HDL 53 09/06/2023   CHOLHDL 3.6 09/06/2023   VLDL 26 09/06/2023   LDLCALC 112 (H) 09/06/2023   LDLCALC 103 (H) 11/04/2021    Therapeutic Lab Levels: No results found for: "LITHIUM" Lab Results  Component Value Date   VALPROATE 68 12/25/2021   VALPROATE <10 (L) 06/30/2021   No results found for: "CBMZ"  Physical Findings   AIMS    Flowsheet Row Admission (Discharged) from 12/07/2021 in [facility] INPATIENT BEHAVIORAL MEDICINE Admission (Discharged) from 10/01/2020 in [facility] INPATIENT BEHAVIORAL MEDICINE Admission (Discharged) from 08/20/2020 in [facility] INPATIENT BEHAVIORAL MEDICINE Ad[facility]) [facility] (Discharged) from 11/10/2019 in [facility] INPATIENT BEHAVIORAL MEDICINE  AIMS Total Score 0 0 0 0 0      AUDIT    Flowsheet Row ED from 09/07/2023 in [facility] Admission (Discharged) from 12/07/2021 in [facility] INPATIENT BEHAVIORAL MEDICINE Admission (Discharged) from 11/01/2021 in [facility] INPATIENT BEHAVIORAL MEDICINE Admission (Discharged) from  06/15/2021 in [facility] INPATIENT BEHAVIORAL MEDICINE Admission (Discharged) from 10/01/2020 in [facility] INPATIENT BEHAVIORAL MEDICINE  Alcohol Use Disorder Identification Test Final Score (AUDIT) 19 0 0 0 2      ECT-MADRS    Flowsheet Row Admission (Discharged) from 11/01/2021 in [facility] INPATIENT BEHAVIORAL MEDICINE  MADRS Total Score 14      GAD-7    Flowsheet Row Office Visit from 07/04/2023 in [facility]  Total GAD-7 Score 18      PHQ2-9    Flowsheet Row ED from 09/07/2023 in [facility] ED from 09/06/2023 in [facility] Office Visit from 07/04/2023 in [facility] Counselor from 12/31/2019 in [facility]  PHQ-2 Total Score 2 2 5 3   PHQ-9 Total Score 8 7 24 14       Flowsheet Row ED from 09/07/2023 in [facility] ED from 09/06/2023 in [facility] ED from 09/03/2023 in [facility] Emergency Department at [facility]  C-SSRS RISK CATEGORY Low Risk Error: Q7 should not be populated when Q6 is No No Risk        Musculoskeletal  Strength & Muscle Tone: within normal limits Gait & Station: normal Patient leans: N/A  Psychiatric Specialty Exam  Presentation  General Appearance:  Appropriate for Environment; Casual  Eye Contact: Fair  Speech: Clear and Coherent; Normal Rate  Speech Volume: Decreased  Handedness: Right   Mood and Affect  Mood: Anxious; Depressed  Affect: Appropriate; Congruent   Thought Process  Thought Processes: Coherent; Goal Directed; Linear  Descriptions of Associations:Intact  Orientation:Full (Time, Place and Person)  Thought Content:Logical  Diagnosis of Schizophrenia or Schizoaffective disorder in past: No data recorded Duration of Psychotic Symptoms: Less than six months   Hallucinations:Hallucinations: Visual   Ideas of Reference:None  Suicidal Thoughts:Suicidal  Thoughts: No   Homicidal Thoughts:Homicidal Thoughts: No    Sensorium  Memory: Remote Good  Judgment: Good  Insight: Good   Executive Functions  Concentration: Fair  Attention Span: Fair  Recall: Fair  Fund of Knowledge: Fair  Language: Fair   Psychomotor Activity  Psychomotor Activity: Psychomotor Activity: Normal    Assets  Assets: Communication Skills; Resilience; Desire for Improvement   Sleep  Sleep: Fair  Physical Exam  Physical Exam Review of  Systems  Constitutional: Negative.   HENT: Negative.    Eyes: Negative.   Respiratory: Negative.    Cardiovascular: Negative.   Gastrointestinal: Negative.   Genitourinary: Negative.   Musculoskeletal: Negative.   Skin: Negative.   Neurological: Negative.   Endo/Heme/Allergies: Negative.   Psychiatric/Behavioral:  Positive for depression and hallucinations.    Blood pressure 114/61, pulse 72, temperature 97.8 F (36.6 C), temperature source Oral, resp. rate 19, SpO2 100%. There is no height or weight on file to calculate BMI.  Treatment Plan Summary: Daily contact with patient to assess and evaluate symptoms and progress in treatment, Medication management, and Plan increase Seroquel to 400 mg at bedtime, Continue Wellbutrin 150 mg qdaily, and start depakote DR 500 mg bid for suspected intermittent explosive disorder  Patient has refused rehab referrals but remains interested in med management.  Park Pope, MD 09/11/2023 2:54 PM

## 2023-09-11 NOTE — Group Note (Signed)
 Group Topic: Recovery Basics  Group Date: 09/11/2023 Start Time: 2000 End Time: 2030 Facilitators: Rae Lips B  Department: [facility]  Number of Participants: 3  Group Focus: abuse issues Treatment Modality:  Individual Therapy and Leisure Development Interventions utilized were patient education and support Purpose: enhance coping skills, express feelings, increase insight, and trigger / craving management  Name: GRECO GASTELUM Date of Birth: 1996/06/15  MR: [id number]    Level of Participation: minimal Quality of Participation: cooperative Interactions with others: n/a Mood/Affect: appropriate Triggers (if applicable): n/a Cognition: coherent/clear Progress: Moderate Response: n/a Plan: patient will be encouraged to come to group Patients Problems:  Patient Active Problem List   Diagnosis Date Noted   MDD (major depressive disorder), recurrent episode, with atypical features (HCC) 09/07/2023   Schizoaffective disorder (HCC) 06/26/2021   Homelessness 11/06/2020   Benzodiazepine abuse (HCC) 08/13/2016   Schizoaffective disorder, depressive type (HCC) 08/12/2016   Schizoaffective disorder, bipolar type (HCC) 09/18/2014   Tobacco use disorder 09/18/2014   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

## 2023-09-11 NOTE — ED Notes (Signed)
Pt sleeping at this time   Rise and fall of chest noted

## 2023-09-11 NOTE — ED Notes (Signed)
 Patient is resting in bed with eyes closed, even unlabored breathing. No s/s of discomfort. Patient will be continued to be monitored for safety per protocol and for changes in condition.

## 2023-09-12 DIAGNOSIS — F1220 Cannabis dependence, uncomplicated: Secondary | ICD-10-CM | POA: Diagnosis not present

## 2023-09-12 MED ORDER — BUPROPION HCL ER (XL) 150 MG PO TB24: 150.0000 mg | ORAL_TABLET | Freq: Every day | ORAL | 0 refills | Status: AC

## 2023-09-12 MED ORDER — DIVALPROEX SODIUM 500 MG PO DR TAB: 500.0000 mg | DELAYED_RELEASE_TABLET | Freq: Two times a day (BID) | ORAL | 0 refills | Status: AC

## 2023-09-12 MED ORDER — QUETIAPINE FUMARATE ER 400 MG PO TB24: 400.0000 mg | ORAL_TABLET | Freq: Every day | ORAL | 0 refills | Status: AC

## 2023-09-12 NOTE — ED Notes (Signed)
 Patient is sleeping. Respirations equal and unlabored, skin warm and dry. No change in assessment or acuity. Routine safety checks conducted according to facility protocol. Will continue to monitor for safety.

## 2023-09-12 NOTE — ED Provider Notes (Signed)
 FBC/OBS ASAP Discharge Summary  Date and Time: 09/12/2023 11:36 AM  Name: Timothy [name]  MRN:  [id number]   Discharge Diagnoses:  Final diagnoses:  Cannabis use disorder, severe, dependence (HCC)  Alcohol abuse  Intermittent explosive disorder    Subjective:  Patient evaluated on day of discharge.  Answered minimally to questions, but denied any changes in sleep and appetite, reports both are stable.  Reports his mood is also stable, reports he feels "fine" today.  Denies any significant symptoms of depression or anxiety.  Reports no side effects to currently prescribed medications.  He denies any suicidal ideations or homicidal ideations on day of discharge.  He denies any auditory or visual hallucinations, does not appear to be responding to internal stimuli.  No evidence of paranoid ideations or delusional thought processes on interview.  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 Patient admitted to [facility] on 09/06/2023 due to auditory and visual hallucinations.   During the patient's hospitalization at the [facility], patient had extensive initial psychiatric evaluation, with daily follow-up assessments focused on detoxification management.  Psychiatric diagnoses provided upon initial assessment:  Cannabis use disorder, severe, dependence Alcohol use disorder IED  Patient was discharged on the following medications: Seroquel 400 mg nightly Depakote DR 500 mg every 12 hours Wellbutrin XL 150 mg daily  Patient's care was discussed during the interdisciplinary team meeting every day during the hospitalization.  The patient denies having side effects to prescribed psychiatric medication.  Gradually, patient started adjusting to milieu. The patient was evaluated each day by a clinical provider to ascertain response to treatment. Improvement was noted by the patient's report of decreasing symptoms, improved sleep and appetite,  affect, medication tolerance, behavior, and participation in unit programming.  Patient was asked each day to complete a self inventory noting mood, mental status, pain, new symptoms, anxiety and concerns.    Symptoms were reported as significantly decreased or resolved completely by discharge.   On day of discharge, the patient reports that their mood is stable. The patient denied having suicidal thoughts for more than 48 hours prior to discharge.  Patient denies having homicidal thoughts.  Patient denies having auditory hallucinations.  Patient denies any visual hallucinations or other symptoms of psychosis. The patient was motivated to continue taking medication with a goal of continued improvement in mental health.   The patient reported that their withdrawal symptoms and cravings responded well to the detox regimen, with overall benefit from the detox program. Supportive psychotherapy was provided, and the patient participated in regular group therapy sessions focused on managing cravings and withdrawal. Coping skills, problem-solving, and relaxation techniques were also part of the program's therapeutic interventions.  Labs were reviewed with the patient, and abnormal results were discussed with the patient.  The patient is able to verbalize their individual safety plan to this provider.  # It is recommended to the patient to continue psychiatric medications as prescribed, after discharge from the hospital.    # It is recommended to the patient to follow up with their outpatient psychiatric provider and PCP.  # It was discussed with the patient, the impact of alcohol, drugs, tobacco have been there overall psychiatric and medical wellbeing, and total abstinence from substance use was recommended.  # Prescriptions provided or sent directly to preferred pharmacy at discharge. Patient agreeable to plan. Given opportunity to ask questions. Appears to feel comfortable with discharge.    # In the  event of worsening symptoms, the patient is instructed to call the crisis hotline, 911 and or go to the nearest ED for appropriate evaluation and treatment of symptoms. To follow-up with primary care provider for other medical issues, concerns and or health care needs  # Patient was discharged [facility] with a plan to follow up as noted below.     Total Time spent with patient: 20 minutes  Past Psychiatric History: Schizoaffective disorder, depression   Reports 1 prior admission: . He was last hospitalized at [facility] in June 2023 under Dr Toni Amend and was discharged on the following medications:    "buPROPion 300 MG 24 hr table PO Daily divalproex 500 MG DR tablet PO  Q 12 hrs hydrOXYzine 50 MG tablet  PO TID PRN QUEtiapine 400 MG tablet  PO HS"   Suicide attempts: pateint denies     Past Medical History: Dermatitis, atopic  Current Medications:  Current Facility-Administered Medications  Medication Dose Route Frequency Provider Last Rate Last Admin   acetaminophen (TYLENOL) tablet 650 mg  650 mg Oral Q6H PRN Bing Neighbors, NP   650 mg at 09/11/23 2227   alum & mag hydroxide-simeth (MAALOX/MYLANTA) 200-200-20 MG/5ML suspension 30 mL  30 mL Oral Q4H PRN Bing Neighbors, NP       buPROPion (WELLBUTRIN XL) 24 hr tablet 150 mg  150 mg Oral Daily Bing Neighbors, NP   150 mg at 09/12/23 0903   divalproex (DEPAKOTE) DR tablet 500 mg  500 mg Oral Q12H Park Pope, MD   500 mg at 09/12/23 6578   magnesium hydroxide (MILK OF MAGNESIA) suspension 30 mL  30 mL Oral Daily PRN Bing Neighbors, NP       OLANZapine (ZYPREXA) injection 10 mg  10 mg Intramuscular TID PRN Bing Neighbors, NP       OLANZapine (ZYPREXA) injection 5 mg  5 mg Intramuscular TID PRN Bing Neighbors, NP       OLANZapine zydis (ZYPREXA) disintegrating tablet 5 mg  5 mg Oral TID PRN Bing Neighbors, NP       QUEtiapine (SEROQUEL XR) 24 hr tablet 400 mg  400 mg Oral QHS Park Pope, MD   400 mg at 09/11/23 2106    traZODone (DESYREL) tablet 50 mg  50 mg Oral QHS PRN Bing Neighbors, NP       triamcinolone cream (KENALOG) 0.1 % cream   Topical TID Miguel Rota, MD   Given at 09/12/23 0904   Current Outpatient Medications  Medication Sig Dispense Refill   [START ON 09/13/2023] buPROPion (WELLBUTRIN XL) 150 MG 24 hr tablet Take 1 tablet (150 mg total) by mouth daily. 30 tablet 0   diphenhydrAMINE (BENADRYL) 25 MG tablet Take 1 tablet (25 mg total) by mouth every 6 (six) hours as needed for up to 7 days. (Patient not taking: Reported on 09/07/2023) 28 tablet 0   divalproex (DEPAKOTE) 500 MG DR tablet Take 1 tablet (500 mg total) by mouth every 12 (twelve) hours. 60 tablet 0   nystatin (MYCOSTATIN/NYSTOP) powder Apply 1 Application topically 3 (three) times daily. (Patient not taking: Reported on 09/07/2023) 15 g 0   QUEtiapine (SEROQUEL XR) 400 MG 24 hr tablet Take 1 tablet (400 mg total) by mouth at bedtime. 30 tablet 0   triamcinolone cream (KENALOG) 0.1 % Apply 1 Application topically 2 (two) times daily. 30 g 0    PTA Medications:  Facility Ordered Medications  Medication   acetaminophen (TYLENOL) tablet 650  mg   alum & mag hydroxide-simeth (MAALOX/MYLANTA) 200-200-20 MG/5ML suspension 30 mL   magnesium hydroxide (MILK OF MAGNESIA) suspension 30 mL   OLANZapine zydis (ZYPREXA) disintegrating tablet 5 mg   OLANZapine (ZYPREXA) injection 5 mg   OLANZapine (ZYPREXA) injection 10 mg   traZODone (DESYREL) tablet 50 mg   buPROPion (WELLBUTRIN XL) 24 hr tablet 150 mg   triamcinolone cream (KENALOG) 0.1 % cream   divalproex (DEPAKOTE) DR tablet 500 mg   QUEtiapine (SEROQUEL XR) 24 hr tablet 400 mg   PTA Medications  Medication Sig   nystatin (MYCOSTATIN/NYSTOP) powder Apply 1 Application topically 3 (three) times daily. (Patient not taking: Reported on 09/07/2023)   triamcinolone cream (KENALOG) 0.1 % Apply 1 Application topically 2 (two) times daily.   diphenhydrAMINE (BENADRYL) 25 MG tablet Take 1  tablet (25 mg total) by mouth every 6 (six) hours as needed for up to 7 days. (Patient not taking: Reported on 09/07/2023)   QUEtiapine (SEROQUEL XR) 400 MG 24 hr tablet Take 1 tablet (400 mg total) by mouth at bedtime.   divalproex (DEPAKOTE) 500 MG DR tablet Take 1 tablet (500 mg total) by mouth every 12 (twelve) hours.   [START ON 09/13/2023] buPROPion (WELLBUTRIN XL) 150 MG 24 hr tablet Take 1 tablet (150 mg total) by mouth daily.       09/12/2023   11:34 AM 09/10/2023   11:05 AM 09/08/2023    8:26 PM  Depression screen PHQ 2/9  Decreased Interest 0 1 1  Down, Depressed, Hopeless 0 1 1  PHQ - 2 Score 0 2 2  Altered sleeping 0 1 1  Tired, decreased energy 0 1 1  Change in appetite 0 0 0  Feeling bad or failure about yourself  0 1 1  Trouble concentrating 0 1 1  Moving slowly or fidgety/restless 0 1 1  Suicidal thoughts 0 1 1  PHQ-9 Score 0 8 8  Difficult doing work/chores Not difficult at all      Flowsheet Row ED from 09/07/2023 in [facility] ED from 09/06/2023 in [facility] ED from 09/03/2023 in [facility] at [facility]  C-SSRS RISK CATEGORY Low Risk Error: Q7 should not be populated when Q6 is No No Risk       Musculoskeletal  Strength & Muscle Tone: within normal limits Gait & Station: normal Patient leans: N/A  Psychiatric Specialty Exam  Presentation  General Appearance:  Appropriate for Environment  Eye Contact: Good  Speech: Clear and Coherent; Normal Rate  Speech Volume: Normal  Handedness: -- (not assessed)   Mood and Affect  Mood: -- ("fine")  Affect: Flat   Thought Process  Thought Processes: Linear  Descriptions of Associations:Intact  Orientation:None  Thought Content:Logical  Diagnosis of Schizophrenia or Schizoaffective disorder in past: No data recorded Duration of Psychotic Symptoms: N/A   Hallucinations:Hallucinations: None  Ideas of  Reference:None  Suicidal Thoughts:Suicidal Thoughts: No  Homicidal Thoughts:Homicidal Thoughts: No   Sensorium  Memory: Immediate Good; Recent Good; Remote Good  Judgment: Fair  Insight: -- (limited)   Executive Functions  Concentration: Fair  Attention Span: Fair  Recall: [employer] of Knowledge: Fair  Language: Fair   Psychomotor Activity  Psychomotor Activity:Psychomotor Activity: Normal   Assets  Assets: Desire for Improvement; Resilience; Communication Skills   Sleep  Sleep:Sleep: Fair    Physical Exam  Physical Exam Vitals and nursing note reviewed.  Constitutional:      General:  He is not in acute distress.    Appearance: He is not ill-appearing.  HENT:     Head: Normocephalic and atraumatic.  Eyes:     Extraocular Movements: Extraocular movements intact.     Conjunctiva/sclera: Conjunctivae normal.  Pulmonary:     Effort: Pulmonary effort is normal. No respiratory distress.  Musculoskeletal:        General: Normal range of motion.  Skin:    General: Skin is warm and dry.    Review of Systems  All other systems reviewed and are negative.  Blood pressure (!) 118/51, pulse 86, temperature 98 F (36.7 C), temperature source Oral, resp. rate 16, SpO2 95%. There is no height or weight on file to calculate BMI.  Demographic Factors:  Male and Low socioeconomic status  Loss Factors: NA  Historical Factors: Impulsivity  Risk Reduction Factors:   NA  Continued Clinical Symptoms:  Alcohol/Substance Abuse/Dependencies Previous Psychiatric Diagnoses and Treatments  Cognitive Features That Contribute To Risk:  None    Suicide Risk:  Mild:  Suicidal ideation of limited frequency, intensity, duration, and specificity.  There are no identifiable plans, no associated intent, mild dysphoria and related symptoms, good self-control (both objective and subjective assessment), few other risk factors, and identifiable protective factors,  including available and accessible social support.  Plan Of Care/Follow-up recommendations:  Activity: as tolerated  Diet: heart healthy  Other: -Follow-up with your outpatient psychiatric provider -instructions on appointment date, time, and address (location) are provided to you in discharge paperwork.  -Take your psychiatric medications as prescribed at discharge - instructions are provided to you in the discharge paperwork  -If you are prescribed an atypical antipsychotic medication, we recommend that your outpatient psychiatrist follow routine screening for side effects within 3 months of discharge, including monitoring: AIMS scale, height, weight, blood pressure, fasting lipid panel, HbA1c, and fasting blood sugar.   -Recommend total abstinence from alcohol, tobacco, and other illicit drug use at discharge.   -If your psychiatric symptoms recur, worsen, or if you have side effects to your psychiatric medications, call your outpatient psychiatric provider, 911, [phone] or go to the nearest emergency department.  -If suicidal thoughts occur, immediately call your outpatient psychiatric provider, 911, [phone] or go to the nearest emergency department.   Disposition: Patient was discharged to the [facility], was provided with a list of facilities he could contact for long-term placement/sober living arrangement/[place] [facility].  Lorri Frederick, MD 09/12/2023, 11:36 AM

## 2023-09-12 NOTE — ED Notes (Signed)
 Pt was provided breakfast.

## 2023-09-12 NOTE — Discharge Planning (Signed)
 Treatment team met on this morning and discussed discharge plans for this patient. Patient will be discharged on today with outpatient resources for the [facility] and local shelters. Patient was provided a list of facilities he could call for longterm placement, sober living arrangements, and [employer]. Patient reported this morning that he has not made any phone calls since he has been here. LCSW informed the patient that he will be discharged on today, however patient reported "The doctor told me I needed to wait". Brief counseling was provided to the patient and he was not receptive to the feedback provided. Patient has not be proactive in his treatment and made no effort to help himself. LCSW to touch basis with team. Outpatient services for therapy and medication management placed in the patient's chart. Bus ticket to be provided to patient discharge. LCSW to sign off. Please inform if further LCSW needs arise prior to discharge.   Fernande Boyden, LCSW Clinical Social Worker [facility] Ph: [phone]

## 2023-10-13 ENCOUNTER — Other Ambulatory Visit (HOSPITAL_COMMUNITY): Payer: Self-pay

## 2023-10-13 MED ORDER — QUETIAPINE FUMARATE ER 400 MG PO TB24
400.0000 mg | ORAL_TABLET | Freq: Every day | ORAL | 0 refills | Status: AC
Start: 2023-09-12 — End: ?
  Filled 2023-10-13: qty 30, 30d supply, fill #0

## 2023-10-14 ENCOUNTER — Other Ambulatory Visit (HOSPITAL_COMMUNITY): Payer: Self-pay

## 2023-10-25 ENCOUNTER — Other Ambulatory Visit (HOSPITAL_COMMUNITY): Payer: Self-pay

## 2023-11-04 ENCOUNTER — Emergency Department (HOSPITAL_COMMUNITY)
Admission: EM | Admit: 2023-11-04 | Discharge: 2023-11-05 | Attending: Emergency Medicine | Admitting: Emergency Medicine

## 2023-11-04 DIAGNOSIS — R4689 Other symptoms and signs involving appearance and behavior: Secondary | ICD-10-CM

## 2023-11-04 DIAGNOSIS — R456 Violent behavior: Secondary | ICD-10-CM | POA: Insufficient documentation

## 2023-11-04 LAB — URINALYSIS, ROUTINE W REFLEX MICROSCOPIC
Bacteria, UA: NONE SEEN
Bilirubin Urine: NEGATIVE
Glucose, UA: NEGATIVE mg/dL
Hgb urine dipstick: NEGATIVE
Ketones, ur: NEGATIVE mg/dL
Leukocytes,Ua: NEGATIVE
Nitrite: NEGATIVE
Protein, ur: 30 mg/dL — AB
Specific Gravity, Urine: 1.026 (ref 1.005–1.030)
pH: 5 (ref 5.0–8.0)

## 2023-11-04 LAB — COMPREHENSIVE METABOLIC PANEL WITH GFR
ALT: 19 U/L (ref 0–44)
AST: 28 U/L (ref 15–41)
Albumin: 3.9 g/dL (ref 3.5–5.0)
Alkaline Phosphatase: 37 U/L — ABNORMAL LOW (ref 38–126)
Anion gap: 14 (ref 5–15)
BUN: 18 mg/dL (ref 6–20)
CO2: 20 mmol/L — ABNORMAL LOW (ref 22–32)
Calcium: 9.3 mg/dL (ref 8.9–10.3)
Chloride: 108 mmol/L (ref 98–111)
Creatinine, Ser: 1.37 mg/dL — ABNORMAL HIGH (ref 0.61–1.24)
GFR, Estimated: >60 mL/min (ref >60)
Glucose, Bld: 100 mg/dL — ABNORMAL HIGH (ref 70–99)
Potassium: 3.6 mmol/L (ref 3.5–5.1)
Sodium: 142 mmol/L (ref 135–145)
Total Bilirubin: 1.3 mg/dL — ABNORMAL HIGH (ref 0.0–1.2)
Total Protein: 7.2 g/dL (ref 6.5–8.1)

## 2023-11-04 LAB — CBC WITH DIFFERENTIAL/PLATELET
Abs Immature Granulocytes: 0.03 10*3/uL (ref 0.00–0.07)
Basophils Absolute: 0 10*3/uL (ref 0.0–0.1)
Basophils Relative: 0 %
Eosinophils Absolute: 0 10*3/uL (ref 0.0–0.5)
Eosinophils Relative: 1 %
HCT: 41.9 % (ref 39.0–52.0)
Hemoglobin: 14.3 g/dL (ref 13.0–17.0)
Immature Granulocytes: 0 %
Lymphocytes Relative: 16 %
Lymphs Abs: 1.2 10*3/uL (ref 0.7–4.0)
MCH: 30.8 pg (ref 26.0–34.0)
MCHC: 34.1 g/dL (ref 30.0–36.0)
MCV: 90.3 fL (ref 80.0–100.0)
Monocytes Absolute: 0.8 10*3/uL (ref 0.1–1.0)
Monocytes Relative: 11 %
Neutro Abs: 5.6 10*3/uL (ref 1.7–7.7)
Neutrophils Relative %: 72 %
Platelets: 230 10*3/uL (ref 150–400)
RBC: 4.64 MIL/uL (ref 4.22–5.81)
RDW: 13.1 % (ref 11.5–15.5)
WBC: 7.8 10*3/uL (ref 4.0–10.5)
nRBC: 0 % (ref 0.0–0.2)

## 2023-11-04 LAB — ETHANOL: Alcohol, Ethyl (B): <15 mg/dL (ref <15)

## 2023-11-04 LAB — SALICYLATE LEVEL: Salicylate Lvl: <7 mg/dL — ABNORMAL LOW (ref 7.0–30.0)

## 2023-11-04 LAB — ACETAMINOPHEN LEVEL: Acetaminophen (Tylenol), Serum: <10 ug/mL — ABNORMAL LOW (ref 10–30)
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
## 2023-11-05 LAB — RAPID URINE DRUG SCREEN, HOSP PERFORMED
Amphetamines: NOT DETECTED
Barbiturates: NOT DETECTED
Benzodiazepines: NOT DETECTED
Cocaine: NOT DETECTED
Opiates: NOT DETECTED
Tetrahydrocannabinol: POSITIVE — AB

## 2023-11-05 MED ORDER — DIPHENHYDRAMINE HCL 50 MG/ML IJ SOLN
50.0000 mg | Freq: Once | INTRAMUSCULAR | Status: AC
  Administered 2023-11-05: 50 mg via INTRAMUSCULAR
  Filled 2023-11-05: qty 1

## 2023-11-05 MED ORDER — LORAZEPAM 2 MG/ML IJ SOLN
2.0000 mg | Freq: Once | INTRAMUSCULAR | Status: AC
  Administered 2023-11-05: 2 mg via INTRAMUSCULAR
  Filled 2023-11-05: qty 1

## 2023-11-05 MED ORDER — NICOTINE 21 MG/24HR TD PT24: 21.0000 mg | MEDICATED_PATCH | Freq: Every day | TRANSDERMAL | Status: DC

## 2023-11-05 MED ORDER — IBUPROFEN 400 MG PO TABS: 600.0000 mg | ORAL_TABLET | Freq: Three times a day (TID) | ORAL | Status: DC | PRN

## 2023-11-05 MED ORDER — ONDANSETRON HCL 4 MG PO TABS: 4.0000 mg | ORAL_TABLET | Freq: Three times a day (TID) | ORAL | Status: DC | PRN

## 2023-11-05 MED ORDER — ALUM & MAG HYDROXIDE-SIMETH 200-200-20 MG/5ML PO SUSP: 30.0000 mL | Freq: Four times a day (QID) | ORAL | Status: DC | PRN

## 2023-11-05 MED ORDER — ZIPRASIDONE MESYLATE 20 MG IM SOLR
20.0000 mg | Freq: Once | INTRAMUSCULAR | Status: AC
  Administered 2023-11-05: 20 mg via INTRAMUSCULAR
  Filled 2023-11-05: qty 20
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
## 2023-12-06 ENCOUNTER — Encounter: Payer: Self-pay | Admitting: Family Medicine

## 2023-12-06 ENCOUNTER — Ambulatory Visit: Admitting: Family Medicine

## 2023-12-06 VITALS — BP 138/75 | HR 70 | Temp 98.3°F | Resp 16 | Ht 76.0 in | Wt 214.0 lb
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
